# Patient Record
Sex: Male | Born: 1950 | Race: Black or African American | Hispanic: No | Marital: Single | Smoking: Never smoker
Health system: Southern US, Community
[De-identification: ages and names within clinical notes are randomized; demographics above are authoritative.]

## PROBLEM LIST (undated history)

## (undated) ENCOUNTER — Emergency Department (HOSPITAL_COMMUNITY): Disposition: A | Discharge status: Home / Self Care | Payer: Medicare Other

## (undated) ENCOUNTER — Emergency Department (HOSPITAL_COMMUNITY): Payer: Medicare Other

## (undated) DIAGNOSIS — I509 Heart failure, unspecified: Secondary | ICD-10-CM

## (undated) DIAGNOSIS — T8859XA Other complications of anesthesia, initial encounter: Secondary | ICD-10-CM

## (undated) DIAGNOSIS — F79 Unspecified intellectual disabilities: Secondary | ICD-10-CM

## (undated) DIAGNOSIS — N2581 Secondary hyperparathyroidism of renal origin: Secondary | ICD-10-CM

## (undated) DIAGNOSIS — I871 Compression of vein: Secondary | ICD-10-CM

## (undated) DIAGNOSIS — I10 Essential (primary) hypertension: Secondary | ICD-10-CM

## (undated) DIAGNOSIS — R011 Cardiac murmur, unspecified: Secondary | ICD-10-CM

## (undated) DIAGNOSIS — L03119 Cellulitis of unspecified part of limb: Secondary | ICD-10-CM

## (undated) DIAGNOSIS — T4145XA Adverse effect of unspecified anesthetic, initial encounter: Secondary | ICD-10-CM

## (undated) DIAGNOSIS — R4701 Aphasia: Secondary | ICD-10-CM

## (undated) DIAGNOSIS — A4152 Sepsis due to Pseudomonas: Secondary | ICD-10-CM

## (undated) DIAGNOSIS — L02419 Cutaneous abscess of limb, unspecified: Secondary | ICD-10-CM

## (undated) DIAGNOSIS — R55 Syncope and collapse: Secondary | ICD-10-CM

## (undated) DIAGNOSIS — E785 Hyperlipidemia, unspecified: Secondary | ICD-10-CM

## (undated) DIAGNOSIS — K219 Gastro-esophageal reflux disease without esophagitis: Secondary | ICD-10-CM

## (undated) DIAGNOSIS — N186 End stage renal disease: Secondary | ICD-10-CM

## (undated) DIAGNOSIS — R569 Unspecified convulsions: Secondary | ICD-10-CM

## (undated) DIAGNOSIS — I8500 Esophageal varices without bleeding: Secondary | ICD-10-CM

## (undated) DIAGNOSIS — D649 Anemia, unspecified: Secondary | ICD-10-CM

## (undated) HISTORY — DX: Aphasia: R47.01

## (undated) HISTORY — DX: End stage renal disease: N18.6

## (undated) HISTORY — DX: Compression of vein: I87.1

## (undated) HISTORY — DX: Cellulitis of unspecified part of limb: L03.119

## (undated) HISTORY — PX: OTHER SURGICAL HISTORY: SHX169

## (undated) HISTORY — DX: Essential (primary) hypertension: I10

## (undated) HISTORY — DX: Unspecified intellectual disabilities: F79

## (undated) HISTORY — DX: Cutaneous abscess of limb, unspecified: L02.419

## (undated) HISTORY — DX: Secondary hyperparathyroidism of renal origin: N25.81

## (undated) HISTORY — DX: Esophageal varices without bleeding: I85.00

## (undated) HISTORY — DX: Anemia, unspecified: D64.9

## (undated) HISTORY — DX: Syncope and collapse: R55

## (undated) HISTORY — DX: Heart failure, unspecified: I50.9

## (undated) HISTORY — DX: Cardiac murmur, unspecified: R01.1

## (undated) HISTORY — DX: Hyperlipidemia, unspecified: E78.5

## (undated) HISTORY — PX: THROMBECTOMY AND REVISION OF ARTERIOVENTOUS (AV) GORETEX  GRAFT: SHX6120

---

## 1998-03-21 ENCOUNTER — Encounter: Admission: RE | Admit: 1998-03-21 | Discharge: 1998-03-21 | Payer: Self-pay | Admitting: Hematology and Oncology

## 1998-06-26 ENCOUNTER — Encounter: Admission: RE | Admit: 1998-06-26 | Discharge: 1998-06-26 | Payer: Self-pay | Admitting: Internal Medicine

## 1998-07-23 ENCOUNTER — Encounter: Admission: RE | Admit: 1998-07-23 | Discharge: 1998-07-23 | Payer: Self-pay | Admitting: Internal Medicine

## 1998-08-15 ENCOUNTER — Encounter: Admission: RE | Admit: 1998-08-15 | Discharge: 1998-08-15 | Payer: Self-pay | Admitting: Hematology and Oncology

## 1998-09-18 ENCOUNTER — Encounter: Admission: RE | Admit: 1998-09-18 | Discharge: 1998-09-18 | Payer: Self-pay | Admitting: Hematology and Oncology

## 1998-12-18 ENCOUNTER — Encounter: Admission: RE | Admit: 1998-12-18 | Discharge: 1998-12-18 | Payer: Self-pay | Admitting: Internal Medicine

## 1999-06-06 ENCOUNTER — Encounter: Admission: RE | Admit: 1999-06-06 | Discharge: 1999-06-06 | Payer: Self-pay | Admitting: Hematology and Oncology

## 1999-12-19 ENCOUNTER — Encounter: Admission: RE | Admit: 1999-12-19 | Discharge: 1999-12-19 | Payer: Self-pay | Admitting: Hematology and Oncology

## 2000-05-29 ENCOUNTER — Ambulatory Visit (HOSPITAL_BASED_OUTPATIENT_CLINIC_OR_DEPARTMENT_OTHER): Admission: RE | Admit: 2000-05-29 | Discharge: 2000-05-29 | Payer: Self-pay | Admitting: Dentistry

## 2000-07-10 ENCOUNTER — Encounter: Admission: RE | Admit: 2000-07-10 | Discharge: 2000-07-10 | Payer: Self-pay | Admitting: Internal Medicine

## 2000-12-14 ENCOUNTER — Encounter: Admission: RE | Admit: 2000-12-14 | Discharge: 2000-12-14 | Payer: Self-pay | Admitting: Internal Medicine

## 2001-12-08 ENCOUNTER — Encounter: Admission: RE | Admit: 2001-12-08 | Discharge: 2001-12-08 | Payer: Self-pay | Admitting: Internal Medicine

## 2002-03-21 ENCOUNTER — Encounter: Admission: RE | Admit: 2002-03-21 | Discharge: 2002-03-21 | Payer: Self-pay | Admitting: Internal Medicine

## 2002-06-17 ENCOUNTER — Encounter: Admission: RE | Admit: 2002-06-17 | Discharge: 2002-06-17 | Payer: Self-pay | Admitting: Internal Medicine

## 2002-06-22 ENCOUNTER — Ambulatory Visit (HOSPITAL_COMMUNITY): Admission: RE | Admit: 2002-06-22 | Discharge: 2002-06-22 | Payer: Self-pay | Admitting: *Deleted

## 2002-06-22 ENCOUNTER — Encounter: Payer: Self-pay | Admitting: *Deleted

## 2002-09-23 ENCOUNTER — Ambulatory Visit (HOSPITAL_COMMUNITY): Admission: RE | Admit: 2002-09-23 | Discharge: 2002-09-23 | Payer: Self-pay | Admitting: Internal Medicine

## 2002-09-23 ENCOUNTER — Encounter: Payer: Self-pay | Admitting: Internal Medicine

## 2002-09-23 ENCOUNTER — Encounter: Admission: RE | Admit: 2002-09-23 | Discharge: 2002-09-23 | Payer: Self-pay | Admitting: Internal Medicine

## 2002-09-30 ENCOUNTER — Encounter: Admission: RE | Admit: 2002-09-30 | Discharge: 2002-09-30 | Payer: Self-pay | Admitting: Internal Medicine

## 2002-12-27 ENCOUNTER — Encounter: Admission: RE | Admit: 2002-12-27 | Discharge: 2002-12-27 | Payer: Self-pay | Admitting: Internal Medicine

## 2003-10-05 ENCOUNTER — Encounter: Admission: RE | Admit: 2003-10-05 | Discharge: 2003-10-05 | Payer: Self-pay | Admitting: Internal Medicine

## 2003-11-24 ENCOUNTER — Encounter: Admission: RE | Admit: 2003-11-24 | Discharge: 2003-11-24 | Payer: Self-pay | Admitting: Internal Medicine

## 2004-02-13 ENCOUNTER — Encounter: Admission: RE | Admit: 2004-02-13 | Discharge: 2004-02-13 | Payer: Self-pay | Admitting: Internal Medicine

## 2004-02-26 ENCOUNTER — Encounter: Admission: RE | Admit: 2004-02-26 | Discharge: 2004-02-26 | Payer: Self-pay | Admitting: Internal Medicine

## 2004-03-06 ENCOUNTER — Encounter: Payer: Self-pay | Admitting: Cardiovascular Disease

## 2004-03-06 ENCOUNTER — Ambulatory Visit (HOSPITAL_COMMUNITY)
Admission: RE | Admit: 2004-03-06 | Discharge: 2004-03-06 | Payer: Self-pay | Admitting: Physical Medicine and Rehabilitation

## 2004-05-02 ENCOUNTER — Encounter: Admission: RE | Admit: 2004-05-02 | Discharge: 2004-05-02 | Payer: Self-pay | Admitting: Internal Medicine

## 2004-05-28 ENCOUNTER — Ambulatory Visit (HOSPITAL_COMMUNITY): Admission: RE | Admit: 2004-05-28 | Discharge: 2004-05-28 | Payer: Self-pay | Admitting: Nephrology

## 2004-07-01 ENCOUNTER — Ambulatory Visit: Payer: Self-pay | Admitting: Internal Medicine

## 2004-07-05 ENCOUNTER — Ambulatory Visit: Payer: Self-pay | Admitting: Internal Medicine

## 2004-07-05 ENCOUNTER — Ambulatory Visit (HOSPITAL_COMMUNITY): Admission: RE | Admit: 2004-07-05 | Discharge: 2004-07-05 | Payer: Self-pay | Admitting: Internal Medicine

## 2004-07-05 ENCOUNTER — Inpatient Hospital Stay (HOSPITAL_COMMUNITY): Admission: AD | Admit: 2004-07-05 | Discharge: 2004-07-12 | Payer: Self-pay | Admitting: Internal Medicine

## 2004-09-04 ENCOUNTER — Ambulatory Visit (HOSPITAL_COMMUNITY): Admission: RE | Admit: 2004-09-04 | Discharge: 2004-09-04 | Payer: Self-pay | Admitting: Nephrology

## 2004-11-15 ENCOUNTER — Ambulatory Visit (HOSPITAL_COMMUNITY): Admission: RE | Admit: 2004-11-15 | Discharge: 2004-11-15 | Payer: Self-pay | Admitting: Vascular Surgery

## 2004-11-25 ENCOUNTER — Ambulatory Visit (HOSPITAL_COMMUNITY): Admission: RE | Admit: 2004-11-25 | Discharge: 2004-11-25 | Payer: Self-pay | Admitting: Vascular Surgery

## 2005-01-24 ENCOUNTER — Ambulatory Visit (HOSPITAL_COMMUNITY): Admission: RE | Admit: 2005-01-24 | Discharge: 2005-01-24 | Payer: Self-pay | Admitting: Vascular Surgery

## 2005-02-26 ENCOUNTER — Ambulatory Visit (HOSPITAL_COMMUNITY): Admission: RE | Admit: 2005-02-26 | Discharge: 2005-02-26 | Payer: Self-pay | Admitting: Vascular Surgery

## 2005-03-19 ENCOUNTER — Ambulatory Visit (HOSPITAL_COMMUNITY): Admission: RE | Admit: 2005-03-19 | Discharge: 2005-03-19 | Payer: Self-pay | Admitting: Vascular Surgery

## 2005-05-21 ENCOUNTER — Encounter: Admission: RE | Admit: 2005-05-21 | Discharge: 2005-05-21 | Payer: Self-pay | Admitting: Nephrology

## 2006-03-18 ENCOUNTER — Encounter: Admission: RE | Admit: 2006-03-18 | Discharge: 2006-03-18 | Payer: Self-pay | Admitting: Nephrology

## 2006-10-19 ENCOUNTER — Emergency Department (HOSPITAL_COMMUNITY): Admission: EM | Admit: 2006-10-19 | Discharge: 2006-10-19 | Payer: Self-pay | Admitting: Emergency Medicine

## 2007-03-11 ENCOUNTER — Ambulatory Visit: Payer: Self-pay | Admitting: Physician Assistant

## 2007-03-11 ENCOUNTER — Ambulatory Visit (HOSPITAL_COMMUNITY): Admission: RE | Admit: 2007-03-11 | Discharge: 2007-03-11 | Payer: Self-pay | Admitting: Vascular Surgery

## 2007-03-13 ENCOUNTER — Ambulatory Visit (HOSPITAL_COMMUNITY): Admission: RE | Admit: 2007-03-13 | Discharge: 2007-03-13 | Payer: Self-pay | Admitting: Vascular Surgery

## 2007-04-21 ENCOUNTER — Encounter: Admission: RE | Admit: 2007-04-21 | Discharge: 2007-04-21 | Payer: Self-pay | Admitting: Nephrology

## 2007-12-08 ENCOUNTER — Ambulatory Visit (HOSPITAL_COMMUNITY): Admission: RE | Admit: 2007-12-08 | Discharge: 2007-12-08 | Payer: Self-pay | Admitting: Vascular Surgery

## 2007-12-08 ENCOUNTER — Ambulatory Visit: Payer: Self-pay | Admitting: Vascular Surgery

## 2008-01-21 ENCOUNTER — Ambulatory Visit (HOSPITAL_COMMUNITY): Admission: RE | Admit: 2008-01-21 | Discharge: 2008-01-21 | Payer: Self-pay | Admitting: Nephrology

## 2008-02-02 ENCOUNTER — Ambulatory Visit: Payer: Self-pay | Admitting: Internal Medicine

## 2008-02-02 DIAGNOSIS — E785 Hyperlipidemia, unspecified: Secondary | ICD-10-CM

## 2008-02-02 DIAGNOSIS — N2581 Secondary hyperparathyroidism of renal origin: Secondary | ICD-10-CM

## 2008-02-02 DIAGNOSIS — D649 Anemia, unspecified: Secondary | ICD-10-CM

## 2008-02-02 DIAGNOSIS — R4701 Aphasia: Secondary | ICD-10-CM

## 2008-02-02 DIAGNOSIS — I10 Essential (primary) hypertension: Secondary | ICD-10-CM | POA: Insufficient documentation

## 2008-02-02 DIAGNOSIS — F79 Unspecified intellectual disabilities: Secondary | ICD-10-CM | POA: Insufficient documentation

## 2008-02-02 DIAGNOSIS — N186 End stage renal disease: Secondary | ICD-10-CM | POA: Insufficient documentation

## 2008-02-02 HISTORY — DX: Anemia, unspecified: D64.9

## 2008-02-02 HISTORY — DX: Unspecified intellectual disabilities: F79

## 2008-02-02 HISTORY — DX: Aphasia: R47.01

## 2008-02-02 HISTORY — DX: Essential (primary) hypertension: I10

## 2008-02-02 HISTORY — DX: Secondary hyperparathyroidism of renal origin: N25.81

## 2008-02-02 HISTORY — DX: Hyperlipidemia, unspecified: E78.5

## 2008-02-29 ENCOUNTER — Encounter (INDEPENDENT_AMBULATORY_CARE_PROVIDER_SITE_OTHER): Payer: Self-pay | Admitting: *Deleted

## 2008-03-06 ENCOUNTER — Encounter (INDEPENDENT_AMBULATORY_CARE_PROVIDER_SITE_OTHER): Payer: Self-pay | Admitting: *Deleted

## 2008-03-06 ENCOUNTER — Ambulatory Visit: Payer: Self-pay | Admitting: *Deleted

## 2008-03-06 DIAGNOSIS — I509 Heart failure, unspecified: Secondary | ICD-10-CM

## 2008-03-06 DIAGNOSIS — R609 Edema, unspecified: Secondary | ICD-10-CM

## 2008-03-06 DIAGNOSIS — L02419 Cutaneous abscess of limb, unspecified: Secondary | ICD-10-CM

## 2008-03-06 DIAGNOSIS — I498 Other specified cardiac arrhythmias: Secondary | ICD-10-CM

## 2008-03-06 DIAGNOSIS — L03119 Cellulitis of unspecified part of limb: Secondary | ICD-10-CM

## 2008-03-06 DIAGNOSIS — H6120 Impacted cerumen, unspecified ear: Secondary | ICD-10-CM

## 2008-03-06 HISTORY — DX: Heart failure, unspecified: I50.9

## 2008-03-06 HISTORY — DX: Cutaneous abscess of limb, unspecified: L02.419

## 2008-05-04 ENCOUNTER — Emergency Department (HOSPITAL_COMMUNITY): Admission: EM | Admit: 2008-05-04 | Discharge: 2008-05-05 | Payer: Self-pay | Admitting: Emergency Medicine

## 2008-05-18 ENCOUNTER — Ambulatory Visit: Payer: Self-pay | Admitting: Internal Medicine

## 2008-05-18 ENCOUNTER — Inpatient Hospital Stay (HOSPITAL_COMMUNITY): Admission: EM | Admit: 2008-05-18 | Discharge: 2008-05-20 | Payer: Self-pay | Admitting: Emergency Medicine

## 2008-06-28 ENCOUNTER — Encounter (INDEPENDENT_AMBULATORY_CARE_PROVIDER_SITE_OTHER): Payer: Self-pay | Admitting: *Deleted

## 2008-06-28 ENCOUNTER — Ambulatory Visit: Payer: Self-pay | Admitting: Internal Medicine

## 2008-06-28 DIAGNOSIS — R1084 Generalized abdominal pain: Secondary | ICD-10-CM

## 2008-07-21 ENCOUNTER — Ambulatory Visit (HOSPITAL_COMMUNITY): Admission: RE | Admit: 2008-07-21 | Discharge: 2008-07-21 | Payer: Self-pay | Admitting: Nephrology

## 2008-08-08 ENCOUNTER — Encounter (INDEPENDENT_AMBULATORY_CARE_PROVIDER_SITE_OTHER): Payer: Self-pay | Admitting: *Deleted

## 2008-08-09 ENCOUNTER — Telehealth (INDEPENDENT_AMBULATORY_CARE_PROVIDER_SITE_OTHER): Payer: Self-pay | Admitting: *Deleted

## 2008-08-16 ENCOUNTER — Encounter (INDEPENDENT_AMBULATORY_CARE_PROVIDER_SITE_OTHER): Payer: Self-pay | Admitting: Gastroenterology

## 2008-08-16 ENCOUNTER — Inpatient Hospital Stay (HOSPITAL_COMMUNITY): Admission: EM | Admit: 2008-08-16 | Discharge: 2008-08-22 | Payer: Self-pay | Admitting: Emergency Medicine

## 2008-08-16 ENCOUNTER — Ambulatory Visit: Payer: Self-pay | Admitting: Infectious Disease

## 2008-08-24 ENCOUNTER — Encounter (INDEPENDENT_AMBULATORY_CARE_PROVIDER_SITE_OTHER): Payer: Self-pay | Admitting: Internal Medicine

## 2008-08-25 ENCOUNTER — Encounter (INDEPENDENT_AMBULATORY_CARE_PROVIDER_SITE_OTHER): Payer: Self-pay | Admitting: Internal Medicine

## 2008-08-25 ENCOUNTER — Ambulatory Visit: Payer: Self-pay | Admitting: Internal Medicine

## 2008-08-25 DIAGNOSIS — L919 Hypertrophic disorder of the skin, unspecified: Secondary | ICD-10-CM

## 2008-08-25 DIAGNOSIS — L909 Atrophic disorder of skin, unspecified: Secondary | ICD-10-CM | POA: Insufficient documentation

## 2008-08-25 DIAGNOSIS — K259 Gastric ulcer, unspecified as acute or chronic, without hemorrhage or perforation: Secondary | ICD-10-CM

## 2008-09-21 ENCOUNTER — Encounter (INDEPENDENT_AMBULATORY_CARE_PROVIDER_SITE_OTHER): Payer: Self-pay | Admitting: *Deleted

## 2008-09-22 ENCOUNTER — Telehealth (INDEPENDENT_AMBULATORY_CARE_PROVIDER_SITE_OTHER): Payer: Self-pay | Admitting: *Deleted

## 2008-10-23 ENCOUNTER — Telehealth (INDEPENDENT_AMBULATORY_CARE_PROVIDER_SITE_OTHER): Payer: Self-pay | Admitting: *Deleted

## 2008-11-06 ENCOUNTER — Ambulatory Visit: Payer: Self-pay | Admitting: Internal Medicine

## 2008-11-06 DIAGNOSIS — H5789 Other specified disorders of eye and adnexa: Secondary | ICD-10-CM | POA: Insufficient documentation

## 2008-11-06 DIAGNOSIS — I8500 Esophageal varices without bleeding: Secondary | ICD-10-CM | POA: Insufficient documentation

## 2008-11-07 DIAGNOSIS — I871 Compression of vein: Secondary | ICD-10-CM

## 2008-11-07 DIAGNOSIS — I8500 Esophageal varices without bleeding: Secondary | ICD-10-CM

## 2008-11-07 HISTORY — DX: Esophageal varices without bleeding: I85.00

## 2008-11-07 HISTORY — DX: Compression of vein: I87.1

## 2008-12-27 ENCOUNTER — Telehealth (INDEPENDENT_AMBULATORY_CARE_PROVIDER_SITE_OTHER): Payer: Self-pay | Admitting: *Deleted

## 2009-01-16 ENCOUNTER — Telehealth (INDEPENDENT_AMBULATORY_CARE_PROVIDER_SITE_OTHER): Payer: Self-pay | Admitting: *Deleted

## 2009-01-16 ENCOUNTER — Emergency Department (HOSPITAL_COMMUNITY): Admission: EM | Admit: 2009-01-16 | Discharge: 2009-01-16 | Payer: Self-pay | Admitting: Emergency Medicine

## 2009-01-30 ENCOUNTER — Inpatient Hospital Stay (HOSPITAL_COMMUNITY): Admission: EM | Admit: 2009-01-30 | Discharge: 2009-02-01 | Payer: Self-pay | Admitting: Emergency Medicine

## 2009-01-30 ENCOUNTER — Ambulatory Visit: Payer: Self-pay | Admitting: Infectious Diseases

## 2009-01-30 ENCOUNTER — Encounter (INDEPENDENT_AMBULATORY_CARE_PROVIDER_SITE_OTHER): Payer: Self-pay | Admitting: *Deleted

## 2009-02-01 ENCOUNTER — Ambulatory Visit: Payer: Self-pay | Admitting: Vascular Surgery

## 2009-02-02 ENCOUNTER — Encounter (INDEPENDENT_AMBULATORY_CARE_PROVIDER_SITE_OTHER): Payer: Self-pay | Admitting: *Deleted

## 2009-02-12 ENCOUNTER — Ambulatory Visit: Payer: Self-pay | Admitting: Internal Medicine

## 2009-03-01 ENCOUNTER — Encounter (INDEPENDENT_AMBULATORY_CARE_PROVIDER_SITE_OTHER): Payer: Self-pay | Admitting: *Deleted

## 2009-05-04 ENCOUNTER — Encounter: Payer: Self-pay | Admitting: Internal Medicine

## 2009-05-04 ENCOUNTER — Ambulatory Visit: Payer: Self-pay | Admitting: Internal Medicine

## 2009-05-04 ENCOUNTER — Inpatient Hospital Stay (HOSPITAL_COMMUNITY): Admission: AD | Admit: 2009-05-04 | Discharge: 2009-05-09 | Payer: Self-pay | Admitting: Internal Medicine

## 2009-05-04 DIAGNOSIS — R55 Syncope and collapse: Secondary | ICD-10-CM

## 2009-05-07 ENCOUNTER — Encounter (INDEPENDENT_AMBULATORY_CARE_PROVIDER_SITE_OTHER): Payer: Self-pay | Admitting: Nephrology

## 2009-05-07 ENCOUNTER — Encounter (INDEPENDENT_AMBULATORY_CARE_PROVIDER_SITE_OTHER): Payer: Self-pay | Admitting: Internal Medicine

## 2009-05-07 DIAGNOSIS — R55 Syncope and collapse: Secondary | ICD-10-CM

## 2009-05-07 HISTORY — DX: Syncope and collapse: R55

## 2009-05-09 ENCOUNTER — Encounter (INDEPENDENT_AMBULATORY_CARE_PROVIDER_SITE_OTHER): Payer: Self-pay | Admitting: Dermatology

## 2009-05-23 ENCOUNTER — Ambulatory Visit: Payer: Self-pay | Admitting: Internal Medicine

## 2009-05-23 DIAGNOSIS — G253 Myoclonus: Secondary | ICD-10-CM

## 2009-05-23 DIAGNOSIS — I9589 Other hypotension: Secondary | ICD-10-CM

## 2009-07-09 ENCOUNTER — Emergency Department (HOSPITAL_COMMUNITY): Admission: EM | Admit: 2009-07-09 | Discharge: 2009-07-09 | Payer: Self-pay | Admitting: Emergency Medicine

## 2009-07-09 ENCOUNTER — Emergency Department (HOSPITAL_COMMUNITY): Admission: EM | Admit: 2009-07-09 | Discharge: 2009-07-10 | Payer: Self-pay | Admitting: Emergency Medicine

## 2009-07-09 ENCOUNTER — Ambulatory Visit (HOSPITAL_COMMUNITY): Admission: RE | Admit: 2009-07-09 | Discharge: 2009-07-09 | Payer: Self-pay | Admitting: Nephrology

## 2009-07-21 ENCOUNTER — Ambulatory Visit: Payer: Self-pay | Admitting: Internal Medicine

## 2009-07-21 ENCOUNTER — Encounter: Payer: Self-pay | Admitting: Internal Medicine

## 2009-07-21 ENCOUNTER — Inpatient Hospital Stay (HOSPITAL_COMMUNITY): Admission: EM | Admit: 2009-07-21 | Discharge: 2009-07-25 | Payer: Self-pay | Admitting: Emergency Medicine

## 2009-07-25 ENCOUNTER — Encounter: Payer: Self-pay | Admitting: Internal Medicine

## 2009-07-30 ENCOUNTER — Telehealth: Payer: Self-pay | Admitting: *Deleted

## 2009-08-10 ENCOUNTER — Ambulatory Visit: Payer: Self-pay | Admitting: Internal Medicine

## 2009-08-10 DIAGNOSIS — R011 Cardiac murmur, unspecified: Secondary | ICD-10-CM

## 2009-08-10 HISTORY — DX: Cardiac murmur, unspecified: R01.1

## 2009-08-22 ENCOUNTER — Ambulatory Visit (HOSPITAL_COMMUNITY): Admission: RE | Admit: 2009-08-22 | Discharge: 2009-08-22 | Payer: Self-pay | Admitting: Internal Medicine

## 2009-08-22 ENCOUNTER — Encounter: Payer: Self-pay | Admitting: Internal Medicine

## 2009-08-22 ENCOUNTER — Ambulatory Visit: Payer: Self-pay | Admitting: Cardiovascular Disease

## 2009-09-19 ENCOUNTER — Telehealth: Payer: Self-pay | Admitting: *Deleted

## 2009-10-08 ENCOUNTER — Encounter (INDEPENDENT_AMBULATORY_CARE_PROVIDER_SITE_OTHER): Payer: Self-pay | Admitting: Internal Medicine

## 2009-11-05 ENCOUNTER — Telehealth (INDEPENDENT_AMBULATORY_CARE_PROVIDER_SITE_OTHER): Payer: Self-pay | Admitting: Internal Medicine

## 2009-11-23 ENCOUNTER — Ambulatory Visit: Payer: Self-pay | Admitting: Internal Medicine

## 2009-12-12 ENCOUNTER — Ambulatory Visit: Payer: Self-pay | Admitting: Internal Medicine

## 2009-12-12 DIAGNOSIS — J069 Acute upper respiratory infection, unspecified: Secondary | ICD-10-CM | POA: Insufficient documentation

## 2009-12-17 ENCOUNTER — Ambulatory Visit: Payer: Self-pay | Admitting: Surgery

## 2009-12-28 ENCOUNTER — Ambulatory Visit (HOSPITAL_COMMUNITY): Admission: RE | Admit: 2009-12-28 | Discharge: 2009-12-28 | Payer: Self-pay | Admitting: Surgery

## 2009-12-28 ENCOUNTER — Ambulatory Visit: Payer: Self-pay | Admitting: Surgery

## 2009-12-30 ENCOUNTER — Inpatient Hospital Stay (HOSPITAL_COMMUNITY): Admission: EM | Admit: 2009-12-30 | Discharge: 2010-01-02 | Payer: Self-pay | Admitting: Emergency Medicine

## 2009-12-30 ENCOUNTER — Encounter (INDEPENDENT_AMBULATORY_CARE_PROVIDER_SITE_OTHER): Payer: Self-pay | Admitting: Internal Medicine

## 2009-12-30 ENCOUNTER — Ambulatory Visit: Payer: Self-pay | Admitting: Internal Medicine

## 2010-01-02 ENCOUNTER — Encounter: Payer: Self-pay | Admitting: Internal Medicine

## 2010-01-02 ENCOUNTER — Encounter: Payer: Self-pay | Admitting: Vascular Surgery

## 2010-01-04 ENCOUNTER — Telehealth (INDEPENDENT_AMBULATORY_CARE_PROVIDER_SITE_OTHER): Payer: Self-pay | Admitting: Internal Medicine

## 2010-01-11 ENCOUNTER — Ambulatory Visit: Payer: Self-pay | Admitting: Vascular Surgery

## 2010-01-17 ENCOUNTER — Telehealth (INDEPENDENT_AMBULATORY_CARE_PROVIDER_SITE_OTHER): Payer: Self-pay | Admitting: Internal Medicine

## 2010-02-18 ENCOUNTER — Ambulatory Visit: Payer: Self-pay | Admitting: Internal Medicine

## 2010-03-04 ENCOUNTER — Telehealth (INDEPENDENT_AMBULATORY_CARE_PROVIDER_SITE_OTHER): Payer: Self-pay | Admitting: Internal Medicine

## 2010-04-26 ENCOUNTER — Telehealth: Payer: Self-pay | Admitting: Internal Medicine

## 2010-05-24 ENCOUNTER — Telehealth: Payer: Self-pay | Admitting: Internal Medicine

## 2010-06-05 ENCOUNTER — Ambulatory Visit: Payer: Self-pay | Admitting: Vascular Surgery

## 2010-06-14 ENCOUNTER — Ambulatory Visit (HOSPITAL_COMMUNITY): Admission: RE | Admit: 2010-06-14 | Discharge: 2010-06-14 | Payer: Self-pay | Admitting: Vascular Surgery

## 2010-06-14 ENCOUNTER — Ambulatory Visit: Payer: Self-pay | Admitting: Vascular Surgery

## 2010-06-21 ENCOUNTER — Telehealth: Payer: Self-pay | Admitting: Internal Medicine

## 2010-06-26 ENCOUNTER — Ambulatory Visit: Payer: Self-pay | Admitting: Vascular Surgery

## 2010-06-27 ENCOUNTER — Telehealth: Payer: Self-pay | Admitting: Internal Medicine

## 2010-08-28 ENCOUNTER — Ambulatory Visit: Payer: Self-pay | Admitting: Vascular Surgery

## 2010-08-28 ENCOUNTER — Ambulatory Visit (HOSPITAL_COMMUNITY): Admission: AD | Admit: 2010-08-28 | Discharge: 2010-08-28 | Payer: Self-pay | Admitting: Vascular Surgery

## 2010-08-30 ENCOUNTER — Telehealth: Payer: Self-pay | Admitting: Internal Medicine

## 2010-09-06 ENCOUNTER — Telehealth: Payer: Self-pay | Admitting: Internal Medicine

## 2010-11-05 ENCOUNTER — Telehealth: Payer: Self-pay | Admitting: *Deleted

## 2010-11-09 ENCOUNTER — Encounter: Payer: Self-pay | Admitting: Nephrology

## 2010-11-10 ENCOUNTER — Encounter: Payer: Self-pay | Admitting: Nephrology

## 2010-11-11 ENCOUNTER — Encounter: Payer: Self-pay | Admitting: Nephrology

## 2010-11-13 ENCOUNTER — Ambulatory Visit
Admission: RE | Admit: 2010-11-13 | Discharge: 2010-11-13 | Payer: Self-pay | Source: Home / Self Care | Attending: Vascular Surgery | Admitting: Vascular Surgery

## 2010-11-14 NOTE — Assessment & Plan Note (Signed)
OFFICE VISIT  [name], [name] DOB:  1951-08-25                                       11/13/2010 KXFGH#:[id number]  [name] has been evaluated previously for a right thigh graft.  He has had a history of mental retardation and does not like to be cannulated with needles.  Therefore the family had been reluctant to proceed with surgery.  They are now ready to proceed, so I suggested that he come in so I can see him and talk to him some before his surgery.  I have had a long discussion with the family who is now agreeable to placement a right thigh AV graft.  I reviewed my previous notes and this appears to a reasonable option.  Has a Diatek in the left side.  He dialyzes Tuesdays, Thursdays and Saturdays and we will schedule his surgery for a non dialysis day.  He will come in on February 3 which is a Friday, have his graft placed and then get dialysis in the hospital on Saturday with plans to discharge after dialysis.  We have again discussed the indications for surgery and the potential complications and the family is agreeable to proceed.    Di Kindle. Edilia Bo, M.D. Electronically Signed  CSD/MEDQ  D:  11/13/2010  T:  11/14/2010  Job:  [id number]  cc:   [facility]

## 2010-11-19 NOTE — Miscellaneous (Signed)
Summary: Hospital Discharge Update  Hospital Discharge  Date of admission:12/30/2009  Date of discharge:01/02/2010  Brief reason for admission/active problems: 1) RUE Graft removal - by vascular surgery due to limb ischemia 2) ESRD - on T, Th, S HD. Currently has R femoral cath access 3) Hypotension - chronic, discharged following day after HD when patient was at his baseline   Followup needed: 1) Ensure patient had home PT/OT    The medication and problem lists have been updated.  Please see the dictated discharge summary for details.       Complete Medication List: 1)  Dialyvite Tabs (B complex-c-folic acid) .... Take 1 tablet by mouth once a day 2)  Calcium 500 Mg Tabs (Calcium) .... Take 1 tablet by mouth once a day 3)  Lipitor 20 Mg Tabs (Atorvastatin calcium) .... Take 1 tablet by mouth once a day 4)  Epogen 3000 Unit/ml Soln (Epoetin alfa) .... 3400 iv 3 times a week 5)  Protonix 40 Mg Pack (Pantoprazole sodium) .... Take one tablet two times a day 6)  Tums Ultra 1000 1000 Mg Chew (Calcium carbonate antacid) .... Take one tablet with each meal 7)  Clonazepam 0.5 Mg Tabs (Clonazepam) .... Take 1/2 tablet by mouth two times a day.   Allergies: No Known Drug Allergies   Patient Instructions: 1)  Please follow up with Dr. Clent Ridges on January 29, 2010 at 1:30 pm.  2)  Please follow up with vascular surgery as needed/scheduled.  3)  Please  contact clinic if Right arm worsens.

## 2010-11-19 NOTE — Progress Notes (Signed)
Summary: refill/gg  Phone Note Refill Request  on Mar 04, 2010 3:25 PM  Refills Requested: Medication #1:  PROTONIX 40 MG PACK Take one tablet two times a day  Method Requested: Electronic Initial call taken by: Merrie Roof RN,  Mar 04, 2010 3:25 PM    Prescriptions: PROTONIX 40 MG PACK (PANTOPRAZOLE SODIUM) Take one tablet two times a day  #60 Packet x 5   Entered and Authorized by:   Elby Showers MD   Signed by:   Elby Showers MD on 03/04/2010   Method used:   Electronically to        [facility] (retail)       [address]       [place], [place]  [address]       Ph: [phone] or [phone]       Fax: [phone]   RxID:   [id number]

## 2010-11-19 NOTE — Progress Notes (Signed)
Summary: Refill/gh  Phone Note Refill Request Message from:  Fax from Pharmacy on June 27, 2010 12:14 PM  Refills Requested: Medication #1:  LIPITOR 20 MG  TABS Take 1 tablet by mouth once a day   Last Refilled: 05/24/2010  Method Requested: Electronic Initial call taken by: Angelina Ok RN,  June 27, 2010 12:15 PM    Prescriptions: LIPITOR 20 MG  TABS (ATORVASTATIN CALCIUM) Take 1 tablet by mouth once a day  #30 Tablet x 0   Entered and Authorized by:   Darnelle Maffucci MD   Signed by:   Darnelle Maffucci MD on 06/29/2010   Method used:   Electronically to        [facility] (retail)       [address]       [place], [place]  [address]       Ph: [phone] or [phone]       Fax: [phone]   RxID:   [id number]

## 2010-11-19 NOTE — Progress Notes (Signed)
Summary: phone *  Phone Note From Other Clinic   Summary of Call: Call from case manager [phone]) ( [place])for pt asking for nursing visits along with the PT and OT that has been ordered.  They request nursing visits on days he does not go to dialysis.  Pt is not eating.    Will you order this? Keane Scrape [phone] ) care giver Initial call taken by: Merrie Roof RN,  January 04, 2010 10:55 AM  Follow-up for Phone Call        I have put an order for home health nursing into the clinic chart.  Please forward to what ever agency he is using.  Let me know if I need to do anything else. Thanks.     Appended Document: phone Genevieve Norlander informed and will see pt

## 2010-11-19 NOTE — Assessment & Plan Note (Signed)
Summary: ACUTE-COUGH([facility])/CFB   Vital Signs:  Patient profile:   60 year old male Height:      61 inches (154.94 cm) Weight:      141.3 pounds (64.23 kg) BMI:     26.79 Temp:     97.6 degrees F (36.44 degrees C) oral Pulse rate:   100 / minute BP sitting:   97 / 63  (right arm) Cuff size:   regular  Vitals Entered By: Theotis Barrio NT II (December 12, 2009 1:40 PM) CC: X4 DAYS COUGH / 2 DAYS OF NO EATING NOR DRINKING /  COUGH /  RUNNY NOSE X4 DAYS- HAS GOTTEN WORSE, Depression Pain Assessment Patient in pain? no      Nutritional Status BMI of 25 - 29 = overweight  Have you ever been in a relationship where you felt threatened, hurt or afraid?No   Does patient need assistance? Functional Status Self care Ambulation Normal Comments X4 DAYS COUGH -RUNNY NOSE /  2 DAYS OF NO EATING NOR DRINKING /  COUGH  Altamese Cabal NOSE HAS GOTTEN WORSE   Primary Care Provider:  Elby Showers MD  CC:  X4 DAYS COUGH / 2 DAYS OF NO EATING NOR DRINKING /  COUGH /  RUNNY NOSE X4 DAYS- HAS GOTTEN WORSE and Depression.  History of Present Illness: Pt is a 60 yo M with MR, mutism, ESRD, CHF who is here with his caregiver because of a recent cough.   Since HD on Saturday he heas had a cough, it has been worsening.  Yesterday he stopped eating.  He went to dialysis.  Yesterday at 6:30 pm he had a sz that lasted .  This included eyes rolling back in head and his whole body was shaking.  He lost control of his bowels.  He did not take his benzos today or yesterday.  He had some myoclonic jerks today which he frequently has.  He has not been eating or drinking much in the last few days.  He went to HD yesterday.   No fevers noted.  No vomiting or diarrhea.   No sick contact.  He goes to [facility] 3 times a weeks for 6 hrs.  He did receive his flu shot this year.    Depression History:      The patient denies a depressed mood most of the day and a diminished interest in his usual daily activities.        Preventive Screening-Counseling & Management  Alcohol-Tobacco     Smoking Status: never  Caffeine-Diet-Exercise     Does Patient Exercise: no  Current Medications (verified): 1)  Dialyvite   Tabs (B Complex-C-Folic Acid) .... Take 1 Tablet By Mouth Once A Day 2)  Calcium 500 Mg  Tabs (Calcium) .... Take 1 Tablet By Mouth Once A Day 3)  Lipitor 20 Mg  Tabs (Atorvastatin Calcium) .... Take 1 Tablet By Mouth Once A Day 4)  Epogen 3000 Unit/ml  Soln (Epoetin Alfa) .... 3400 Iv 3 Times A Week 5)  Protonix 40 Mg Pack (Pantoprazole Sodium) .... Take One Tablet Two Times A Day 6)  Tums Ultra 1000 1000 Mg Chew (Calcium Carbonate Antacid) .... Take One Tablet With Each Meal 7)  Clonazepam 0.5 Mg Tabs (Clonazepam) .... Take 1/2 Tablet By Mouth Two Times A Day.  Allergies (verified): No Known Drug Allergies  Past History:  Past Medical History: Last updated: 02/02/2008 Anemia-iron deficiency Hyperlipidemia Hypertension Renal failure Positive ANA speckled pattern titer:  1:160 Hx of Cellulitis of  right leg Mental retardation Mutism Secondary hyperparathyroidism Complex cyst right kidney Pt with left arm AV graft that he won't let HD use Pt with right arm cath for HD now CAREGIVER:  Carroll Sage Sister:  POA  [name]  Social History: Last updated: 08/10/2009 Single Alcohol use-no Drug use-no Lives at home - with care provider, Carroll Sage - for last 18 yrs.  Review of Systems       The patient complains of prolonged cough.  The patient denies fever, melena, and hematochezia.    Physical Exam  General:  alert and well-developed.  alert and well-developed.   Head:  atraumatic.  atraumatic.   Eyes:  conjunctiva injected bilaterally,no mucopurulent drainage. Nose:  clear mucus drainage Mouth:  no oropharyngeal lesions Lungs:  normal breath sounds, no crackles, and no wheezes.   Heart:  2/6 SEM heard best at left 2nd ICSnormal rate, regular rhythm, no gallop, and no  rub.   Abdomen:  soft, non-tender, no distention, no masses, no guarding, and no rigidity.   Pulses:  1+ throughout Extremities:  trace left pedal edema and trace right pedal edema.   Neurologic:  mute, MR, not fully cooperative   Impression & Recommendations:  Problem # 1:  UPPER RESPIRATORY INFECTION, ACUTE (ICD-465.9) Pt's symptom complex and exam are most consistent with a viral URI.  His lungs are clear and he has been afebrile.  He goes to what sounds like a day care several days a week and likely came into contact with the virus there. I have informed his caretaker that he is likely to improve within the coming days and that they can continue to try to use robitussin as needed.  They should also tryto keep him hydrated if possible.  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  I think this was due to him being off of his benzodiazepine for the past few days.  His caretaker thinks that they will be able to get him to take this pill over the coming days until he improves completely from #1.  Problem # 3:  HYPOTENSION (ICD-458.9) His BP is soft today, but better than it has been recently.  He does not look septic by any means.  He has been asymptomatic of this as far as we can tell.  He will have HD tomorrow.  Problem # 4:  RENAL FAILURE (ICD-586) Pt had HD yesterday and will go again tomorrow.  I have advised his caretakers to mention these recent problems to his nephrologists again tomorrow and to get some advice about appropriate rehydration regimens.  Perhaps they can put some fluid on him tomorrow if they feel he is total body fluid deplete.  He has trace edema in his bilateral lower extremities, so I do not think he is fluid deplete today.  Complete Medication List: 1)  Dialyvite Tabs (B complex-c-folic acid) .... Take 1 tablet by mouth once a day 2)   Calcium 500 Mg Tabs (Calcium) .... Take 1 tablet by mouth once a day 3)  Lipitor 20 Mg Tabs (Atorvastatin calcium) .... Take 1 tablet by mouth once a day 4)  Epogen 3000 Unit/ml Soln (Epoetin alfa) .... 3400 iv 3 times a week 5)  Protonix 40 Mg Pack (Pantoprazole sodium) .... Take one tablet two times a day 6)  Tums Ultra 1000 1000 Mg Chew (Calcium carbonate antacid) .Marland KitchenMarland KitchenMarland Kitchen  Take one tablet with each meal 7)  Clonazepam 0.5 Mg Tabs (Clonazepam) .... Take 1/2 tablet by mouth two times a day.  Patient Instructions: 1)  This is likely a viral upper respirator infection.  I do not detect any signs of pneumonia or other bacterial infections.  This should get better with time. 2)  Make sure he continues to take his Clonazepam as prescribed.  If he can't take his other meds for a few days that should be OK. 3)  Try to get him to take in some liquids if nothing else.  Ensure is OK for today if that is all he will take, but as his renal docs about that tomorrow. 4)  Come back if his symptoms aren't better in 2 weeks otherwise schedule an appointment for 2 months for a return check up. 5)  He can continue to take robitussin for the cough if he can tolerate it.   Prevention & Chronic Care Immunizations   Influenza vaccine: Not documented   Influenza vaccine deferral: Refused  (08/10/2009)    Tetanus booster: Not documented   Td booster deferral: Not indicated  (08/10/2009)    Pneumococcal vaccine: Not documented    H. zoster vaccine: Not documented  Colorectal Screening   Hemoccult: Not documented    Colonoscopy: Not documented  Other Screening   PSA: Not documented   Smoking status: never  (12/12/2009)  Lipids   Total Cholesterol: Not documented   LDL: Not documented   LDL Direct: Not documented   HDL: Not documented   Triglycerides: Not documented    SGOT (AST): Not documented   SGPT (ALT): Not documented   Alkaline phosphatase: Not documented   Total bilirubin: Not  documented  Hypertension   Last Blood Pressure: 97 / 63  (12/12/2009)   Serum creatinine: Not documented   Serum potassium Not documented  Self-Management Support :   Personal Goals (by the next clinic visit) :      Personal blood pressure goal: 140/90  (12/12/2009)     Personal LDL goal: 100  (12/12/2009)    Patient will work on the following items until the next clinic visit to reach self-care goals:     Medications and monitoring: take my medicines every day, bring all of my medications to every visit  (12/12/2009)     Eating: drink diet soda or water instead of juice or soda, eat more vegetables, use fresh or frozen vegetables, eat foods that are low in salt, eat baked foods instead of fried foods, eat fruit for snacks and desserts, limit or avoid alcohol  (12/12/2009)     Activity: take a 30 minute walk every day  (05/04/2009)    Hypertension self-management support: Resources for patients handout  (12/12/2009)    Lipid self-management support: Resources for patients handout  (12/12/2009)        Resource handout printed.

## 2010-11-19 NOTE — Progress Notes (Signed)
Summary: med refill/gp  Phone Note Refill Request Message from:  Fax from Pharmacy on January 17, 2010 12:24 PM  Refills Requested: Medication #1:  CLONAZEPAM 0.5 MG TABS take 1/2 tablet by mouth two times a day.   Last Refilled: 12/05/2009  Method Requested: Telephone to Pharmacy Initial call taken by: Chinita Pester RN,  January 17, 2010 12:24 PM  Follow-up for Phone Call        Reviewed chart.  Refill appropriate. Follow-up by: Blanch Media MD,  January 18, 2010 3:58 PM  Additional Follow-up for Phone Call Additional follow up Details #1::        Rx called to pharmacy.  Family notified that prescription has been called to the pharmacy. Additional Follow-up by: Angelina Ok RN,  January 18, 2010 4:20 PM    Prescriptions: CLONAZEPAM 0.5 MG TABS (CLONAZEPAM) take 1/2 tablet by mouth two times a day.  #32 x 2   Entered and Authorized by:   Blanch Media MD   Signed by:   Blanch Media MD on 01/18/2010   Method used:   Telephoned to ...       [facility] (retail)       [address]       [place], [place]  [id number]       Ph: [phone] or [phone]       Fax: [phone]   RxID:   [id number]

## 2010-11-19 NOTE — Consult Note (Signed)
Summary: [facility]: New Pt. Eval  [facility]: New Pt. Eval   Imported By: Florinda Marker 11/01/2009 14:24:46  _____________________________________________________________________  External Attachment:    Type:   Image     Comment:   External Document

## 2010-11-19 NOTE — Progress Notes (Signed)
Summary: Refill/gh  Phone Note Refill Request Message from:  Fax from Pharmacy on May 24, 2010 3:10 PM  Refills Requested: Medication #1:  LIPITOR 20 MG  TABS Take 1 tablet by mouth once a day   Last Refilled: 04/26/2010  Method Requested: Electronic Initial call taken by: Angelina Ok RN,  May 24, 2010 3:11 PM  Follow-up for Phone Call       Follow-up by: Darnelle Maffucci MD,  May 24, 2010 3:48 PM    Prescriptions: LIPITOR 20 MG  TABS (ATORVASTATIN CALCIUM) Take 1 tablet by mouth once a day  #30 Tablet x 0   Entered and Authorized by:   Darnelle Maffucci MD   Signed by:   Darnelle Maffucci MD on 05/24/2010   Method used:   Electronically to        [facility] (retail)       [address]       [place], [place]  [id number]       Ph: [phone] or [phone]       Fax: [phone]   RxID:   [id number]

## 2010-11-19 NOTE — Progress Notes (Signed)
Summary: refill/ hla  Phone Note Refill Request Message from:  Fax from Pharmacy on August 30, 2010 2:53 PM  Refills Requested: Medication #1:  CLONAZEPAM 0.5 MG TABS take 1/2 tablet by mouth two times a day.   Dosage confirmed as above?Dosage Confirmed   Last Refilled: 10/3 last visit 02/2010, last labs 12/2009  Initial call taken by: Marin Roberts RN,  August 30, 2010 2:54 PM  Follow-up for Phone Call        Refill approved-nurse to complete Follow-up by: Darnelle Maffucci MD,  August 30, 2010 6:01 PM  Additional Follow-up for Phone Call Additional follow up Details #1::        Rx called to pharmacy Additional Follow-up by: Merrie Roof RN,  September 02, 2010 12:50 PM    Prescriptions: CLONAZEPAM 0.5 MG TABS (CLONAZEPAM) take 1/2 tablet by mouth two times a day.  #32 x 3   Entered and Authorized by:   Darnelle Maffucci MD   Signed by:   Darnelle Maffucci MD on 08/30/2010   Method used:   Telephoned to ...       [facility] (retail)       [address]       [place], [place]  [id number]       Ph: [phone] or [phone]       Fax: [phone]   RxID:   [id number]

## 2010-11-19 NOTE — Progress Notes (Signed)
Summary: refill/ hla  Phone Note Refill Request Message from:  Fax from Pharmacy on November 05, 2009 11:34 AM  Refills Requested: Medication #1:  CLONAZEPAM 0.5 MG TABS take 1/2 tablet by mouth two times a day..   Last Refilled: 07/27/2009 Initial call taken by: Marin Roberts RN,  November 05, 2009 11:34 AM  Follow-up for Phone Call        signed. please call in.  thank you.    Prescriptions: CLONAZEPAM 0.5 MG TABS (CLONAZEPAM) take 1/2 tablet by mouth two times a day.  #32 x 1   Entered and Authorized by:   Elby Showers MD   Signed by:   Elby Showers MD on 11/06/2009   Method used:   Telephoned to ...       [facility] (retail)       [address]       [place], [place]  [id number]       Ph: [phone] or [phone]       Fax: [phone]   RxID:   [id number]

## 2010-11-19 NOTE — Progress Notes (Signed)
Summary: refill/ hla  Phone Note Refill Request Message from:  Fax from Pharmacy on April 26, 2010 5:06 PM  Refills Requested: Medication #1:  LIPITOR 20 MG  TABS Take 1 tablet by mouth once a day   Last Refilled: 6/2 Initial call taken by: Marin Roberts RN,  April 26, 2010 5:06 PM  Follow-up for Phone Call        Refill approved-nurse to complete Follow-up by: Julaine Fusi  DO,  April 26, 2010 5:25 PM    Prescriptions: LIPITOR 20 MG  TABS (ATORVASTATIN CALCIUM) Take 1 tablet by mouth once a day  #30 Tablet x 0   Entered by:   Julaine Fusi  DO   Authorized by:   Darnelle Maffucci MD   Signed by:   Julaine Fusi  DO on 04/26/2010   Method used:   Electronically to        [facility] (retail)       [address]       [place], [place]  [id number]       Ph: [phone] or [phone]       Fax: [phone]   RxID:   [id number]

## 2010-11-19 NOTE — Assessment & Plan Note (Signed)
Summary: EST-CK/FU/MEDS/CFB   Vital Signs:  Patient profile:   60 year old male Height:      61 inches (154.94 cm) Weight:      147.3 pounds (66.95 kg) BMI:     27.93 Pulse rate:   93 / minute BP sitting:   133 / 85  (right arm)  Vitals Entered By: Krystal Eaton Duncan Dull) (November 23, 2009 3:12 PM)  Does patient need assistance? Functional Status Cook/clean, Shopping, Social activities Ambulation Impaired:Risk for fall   Primary Care Provider:  Elby Showers MD   History of Present Illness: This is a 60 year old man with past medical history outlined in this chart who is here for check up.  He is with his family and they have several concerns:  1) he is getting HD though a catether in the right arm.  they wonder what would happen if the catheter were to become infected or clotted. what would we do?  They wonder if he could have a graft placed in his thigh.  2) want me to fill out paperwork for a camp this summer  3) he has inflamed conjuntiva and will not tolerate eye drops.  4) wonder if he is getting gynecomastia.    Current Medications (verified): 1)  Dialyvite   Tabs (B Complex-C-Folic Acid) .... Take 1 Tablet By Mouth Once A Day 2)  Calcium 500 Mg  Tabs (Calcium) .... Take 1 Tablet By Mouth Once A Day 3)  Lipitor 20 Mg  Tabs (Atorvastatin Calcium) .... Take 1 Tablet By Mouth Once A Day 4)  Epogen 3000 Unit/ml  Soln (Epoetin Alfa) .... 3400 Iv 3 Times A Week 5)  Protonix 40 Mg Pack (Pantoprazole Sodium) .... Take One Tablet Two Times A Day 6)  Tums Ultra 1000 1000 Mg Chew (Calcium Carbonate Antacid) .... Take One Tablet With Each Meal 7)  Clonazepam 0.5 Mg Tabs (Clonazepam) .... Take 1/2 Tablet By Mouth Two Times A Day.  Allergies (verified): No Known Drug Allergies  Review of Systems       per hpi  Physical Exam  General:  alert and overweight-appearing.   Head:  normocephalic and atraumatic.   Eyes:  vision grossly intact, pupils equal, pupils round,  pupils reactive to light, and conjunctival injection.   Mouth:  good dentition and pharynx pink and moist.   Breasts:  does have excess tissue. no masses, no discharge, no tenderness. Lungs:  normal respiratory effort and normal breath sounds.   Heart:  normal rate, regular rhythm, and no murmur.   Abdomen:  soft, non-tender, normal bowel sounds, no distention, and no masses.   Pulses:  +1 Extremities:  no edema Neurologic:  cranial nerves II-XII intact.  mute, responds to simple comands and questions. some spastic motion, not sure if this is volitional. Skin:  no suspicious lesions.   Psych:  does not speak or make eye contact.     Impression & Recommendations:  Problem # 1:  HYPOTENSION (ICD-458.9) BP is fine today. No changes.  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  will need to look into prior neuro work up to see why he is not on anything for this.  the family did not follow up with neuro at [facility].  Problem # 3:  REDNESS OR DISCHARGE OF EYE (ICD-379.93) will not tolerate eye drops.  will try benadryl PO  Problem # 4:  RENAL FAILURE (ICD-586) No on HD and followed by Dr. Reynolds Bowl.  This patient  is very anxious about medical care but tolerates HD though the current portacath.  Family wants more secure access and are interested in a graft.  I have suggested that they discuss this with Dr. Salena Saner.  Otherwise no change on this issue, going ot HD regularly.  Complete Medication List: 1)  Dialyvite Tabs (B complex-c-folic acid) .... Take 1 tablet by mouth once a day 2)  Calcium 500 Mg Tabs (Calcium) .... Take 1 tablet by mouth once a day 3)  Lipitor 20 Mg Tabs (Atorvastatin calcium) .... Take 1 tablet by mouth once a day 4)  Epogen 3000 Unit/ml Soln (Epoetin alfa) .... 3400 iv 3 times a week 5)  Protonix 40 Mg Pack (Pantoprazole sodium) .... Take one tablet two times a day 6)  Tums Ultra 1000 1000 Mg Chew (Calcium carbonate antacid) .... Take one tablet with  each meal 7)  Clonazepam 0.5 Mg Tabs (Clonazepam) .... Take 1/2 tablet by mouth two times a day.  Patient Instructions: 1)  Please schedule a follow-up appointment in 3 months. 2)  You should try benadryl 62 mg every 6 hours as needed for eye redness.  Prevention & Chronic Care Immunizations   Influenza vaccine: Not documented   Influenza vaccine deferral: Refused  (08/10/2009)    Tetanus booster: Not documented   Td booster deferral: Not indicated  (08/10/2009)    Pneumococcal vaccine: Not documented    H. zoster vaccine: Not documented  Colorectal Screening   Hemoccult: Not documented    Colonoscopy: Not documented  Other Screening   PSA: Not documented   Smoking status: never  (08/10/2009)  Lipids   Total Cholesterol: Not documented   LDL: Not documented   LDL Direct: Not documented   HDL: Not documented   Triglycerides: Not documented    SGOT (AST): Not documented   SGPT (ALT): Not documented   Alkaline phosphatase: Not documented   Total bilirubin: Not documented  Hypertension   Last Blood Pressure: 133 / 85  (11/23/2009)   Serum creatinine: Not documented   Serum potassium Not documented    Hypertension flowsheet reviewed?: Yes   Progress toward BP goal: Deteriorated  Self-Management Support :    Hypertension self-management support: Not documented    Lipid self-management support: Not documented

## 2010-11-19 NOTE — Progress Notes (Signed)
Summary: Refill/gh  Phone Note Refill Request Message from:  Fax from Pharmacy on June 21, 2010 11:41 AM  Refills Requested: Medication #1:  DIALYVITE   TABS Take 1 tablet by mouth once a day   Last Refilled: 05/20/2010  Method Requested: Electronic Initial call taken by: Angelina Ok RN,  June 21, 2010 11:41 AM  Follow-up for Phone Call        Please ask him to get this from his HD doctor. Thanks Follow-up by: Blanch Media MD,  June 21, 2010 12:56 PM    Prescriptions: DIALYVITE   TABS (B COMPLEX-C-FOLIC ACID) Take 1 tablet by mouth once a day  #30 Tablet x 10   Entered and Authorized by:   Darnelle Maffucci MD   Signed by:   Darnelle Maffucci MD on 06/24/2010   Method used:   Electronically to        [facility] (retail)       [address]       [place], [place]  [id number]       Ph: [phone] or [phone]       Fax: [phone]   RxID:   [id number]

## 2010-11-19 NOTE — Initial Assessments (Signed)
Summary: H&P  INTERNAL MEDICINE TEACHING SERVICE ADMISSION HISTORY & PHYSICAL   Attending: Dr. Julaine Fusi First contact: Dr. Baltazar Apo [phone]    Second contact: Dr. Aldine Contes [phone] [facility], after-hours: [phone], [phone])    PCP: Dr. Clent Ridges   CC: bleeding from the graft   HPI: 60 yr old AAM with PMH significant for mental retardation, mutism, ESRD on HD on TTS schedule comes to the ED with bleeding from a recently done graft site. He had a RUE graft done with Dr. Myra Gianotti two days ago 12/28/2009. Per family it started to bleed right after the procedure, nevertheless, he was sent home and they were told that the bleeding will subside. However, bleeding persisted and hence he is brought in for evaluation. In the ER he was seen by Dr. Edilia Bo, who identified that patient's right upper extremity was mottled and there was diminished radial pulsation. He was diagnosed of ac. limb ischemia and was taken to the ER. The bleeding graft was removed and is sent back to [place] for follow up. 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 (Nov 2009). H. 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    Past Surgical History:  Left forearm graft for HD R forearm graft December 28, 2009  Current Meds:  DIALYVITE   TABS (B  COMPLEX-C-FOLIC ACID) Take 1 tablet by mouth once a day CALCIUM 500 MG  TABS (CALCIUM) Take 1 tablet by mouth once a day LIPITOR 20 MG  TABS (ATORVASTATIN CALCIUM) Take 1 tablet by mouth once a day EPOGEN 3000 UNIT/ML  SOLN (EPOETIN ALFA) 3400 IV 3 times a week PROTONIX 40 MG PACK (PANTOPRAZOLE SODIUM) Take one tablet two times a day TUMS ULTRA 1000 1000 MG CHEW (CALCIUM CARBONATE ANTACID) take one tablet with each meal CLONAZEPAM 0.5 MG TABS (CLONAZEPAM) take 1/2 tablet by mouth two times a day.   Social History:  Single Alcohol use-no Drug use-no Lives at home - with care provider, Carroll Sage - for last 18 yrs.   Family History:  Non contributory to the current problem.   Review of System: Sister noted few jerky motions of his limbs. Rest per HPI.   Vital Signs: BP-101/60 HR-114 RR-18 Temp-98.4 Sat-94%/ra  Physical Exam: GEN- Not in distress HEENT- NCAT, PERRL, no icterus/pallor LUNGS- clear to auscultation.   CVS- RRR, no murmur ABD- soft, non-tender, normal bowel sounds.   EXT- no cyanosis, clubbing or edema.  NEURO- no focal deficit. PSY- appropriate.    LAB RESULTS WBC                                      10.1              4.0-10.5         K/uL  RBC  3.03       l      4.22-5.81        MIL/uL  Hemoglobin (HGB)                         9.3        l      13.0-17.0        g/dL  Hematocrit (HCT)                         28.3       l      39.0-52.0        %  MCV                                      93.3              78.0-100.0       fL  MCHC                                     32.7              30.0-36.0        g/dL  RDW                                      20.2       h      11.5-15.5        %  Platelet Count (PLT)                     156               150-400          K/uL  Neutrophils, %                           84         h      43-77            %  Lymphocytes, %                           8          l      12-46            %   Monocytes, %                             8                 3-12             %  Eosinophils, %                           0                 0-5              %  Basophils, %  0                 0-1              %  Neutrophils, Absolute                    8.6        h      1.7-7.7          K/uL  Lymphocytes, Absolute                    0.8               0.7-4.0          K/uL  Monocytes, Absolute                      0.8               0.1-1.0          K/uL  Eosinophils, Absolute                    0.0               0.0-0.7          K/uL  Basophils, Absolute                      0.0               0.0-0.1          K/uL  INR 1.31     PTT 34   TCO2                                     31                0-100            mmol/L  Ionized Calcium                          1.13              1.12-1.32        mmol/L  Hemoglobin (HGB)                         9.9        l      13.0-17.0        g/dL  Hematocrit (HCT)                         29.0       l      39.0-52.0        %  Sodium (NA)                              139               135-145          mEq/L  Potassium (K)                            4.0  3.5-5.1          mEq/L  Chloride                                 103               96-112           mEq/L  Glucose                                  97                70-99            mg/dL  BUN                                      23                6-23             mg/dL  Creatinine                               8.0        h      0.4-1.5          mg/dL    ASSESSMENT & PLAN This is a 60 yo man with R UE ischemia: due to malfunctioning graft, which has now been removed. Good pulsation noted after the procedure. Will keep limb elevated.  ESRD: Continue TTS dialysis with R femoral cath.  Myoclonic jerks: continue klonopin.  Anemia: Due to ESRD. On epo shots.  HL: continue lipitor.  Tachycardia: regular on monitor. Monitor for now. Prophylaxis: SCD, protonix     ATTENDING PHYSICIAN: I performed and/or observed a history and physical examination of the patient.  I discussed the case with the residents as noted and reviewed the residents' notes.  I agree with the findings and plan--please refer to the attending physician note for more details.  Signature________________________________  Printed Name_____________________________

## 2010-11-19 NOTE — Assessment & Plan Note (Signed)
Summary: HFU-PER DR [facility]/CFB   Vital Signs:  Patient profile:   60 year old male Height:      61 inches Weight:      138.5 pounds BMI:     26.26 Temp:     97.8 degrees F oral Pulse rate:   99 / minute BP sitting:   120 / 81  (right arm)  Vitals Entered By: Filomena Jungling NT II (Feb 18, 2010 9:46 AM) CC: HFU Is Patient Diabetic? No Pain Assessment Patient in pain? no      Nutritional Status BMI of 25 - 29 = overweight  Have you ever been in a relationship where you felt threatened, hurt or afraid?No   Does patient need assistance? Ambulation Impaired:Risk for fall Comments LIVES IN HOME   Primary Care Provider:  Elby Showers MD  CC:  HFU.  History of Present Illness: 60 yom with pmh outlined in this chart.  He is here after hospitalization for subclavian stele syndrome after placement of an av fistula for HD.  His arm has recovered, he is eating well.  He is here with his caretaker.  He is receiving HD via a temp catheter in his right groin. No recent myoclonic spells.  No major concerns from his care taker.  Preventive Screening-Counseling & Management  Alcohol-Tobacco     Smoking Status: never  Caffeine-Diet-Exercise     Does Patient Exercise: no  Current Medications (verified): 1)  Dialyvite   Tabs (B Complex-C-Folic Acid) .... Take 1 Tablet By Mouth Once A Day 2)  Calcium 500 Mg  Tabs (Calcium) .... Take 1 Tablet By Mouth Once A Day 3)  Lipitor 20 Mg  Tabs (Atorvastatin Calcium) .... Take 1 Tablet By Mouth Once A Day 4)  Epogen 3000 Unit/ml  Soln (Epoetin Alfa) .... 3400 Iv 3 Times A Week 5)  Protonix 40 Mg Pack (Pantoprazole Sodium) .... Take One Tablet Two Times A Day 6)  Tums Ultra 1000 1000 Mg Chew (Calcium Carbonate Antacid) .... Take One Tablet With Each Meal 7)  Clonazepam 0.5 Mg Tabs (Clonazepam) .... Take 1/2 Tablet By Mouth Two Times A Day. 8)  Zemplar 2 Mcg/ml Soln (Paricalcitol) .... 3 Micrograms Iv T, Th, S With Dialysis  Allergies: No Known  Drug Allergies  Review of Systems       per hpi  Physical Exam  General:  alert and well-developed.   Head:  normocephalic and atraumatic.   Eyes:  exopthalamos with injected sclera Lungs:  normal respiratory effort and normal breath sounds.   Heart:  normal rate, regular rhythm, and no murmur.   Abdomen:  soft, non-tender, and normal bowel sounds.   Extremities:  no edema on LE's. +1 edema over the right UE with good strength and movement. Neurologic:  cranial nerves II-XII intact.  does not speak, but responds to caretakers comands, smiles and waves.  gait is fairly normal. Skin:  no suspicious lesions.     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  Still taking the clonazepam for this, which I have refilled today.  Now they will have enough refills to get them to next appointment in 6 months.  Problem # 2:  RENAL FAILURE (ICD-586) Follows with dr. Reynolds Bowl.  Seems to be doing well.  Still working towards stable perminent access, will likely need a graft in the thigh next.  Problem # 3:  HYPERTENSION (ICD-401.9) BP is fine today.  Controled with HD.  BP  today: 120/81 Prior BP: 97/63 (12/12/2009)  Problem # 4:  HYPERLIPIDEMIA (ICD-272.4) Not sure when last time lipids were checked, he is scared of needles, so we do not do blood work in this clinic.  may need to get it checked at HD in the future.  His updated medication list for this problem includes:    Lipitor 20 Mg Tabs (Atorvastatin calcium) .Marland Kitchen... Take 1 tablet by mouth once a day  Complete Medication List: 1)  Dialyvite Tabs (B complex-c-folic acid) .... Take 1 tablet by mouth once a day 2)  Calcium 500 Mg Tabs (Calcium) .... Take 1 tablet by mouth once a day 3)  Lipitor 20 Mg Tabs (Atorvastatin calcium) .... Take 1 tablet by mouth once a day 4)  Epogen 3000 Unit/ml Soln (Epoetin alfa) .... 3400 iv 3 times a week 5)  Protonix 40 Mg Pack (Pantoprazole sodium)  .... Take one tablet two times a day 6)  Tums Ultra 1000 1000 Mg Chew (Calcium carbonate antacid) .... Take one tablet with each meal 7)  Clonazepam 0.5 Mg Tabs (Clonazepam) .... Take 1/2 tablet by mouth two times a day. 8)  Zemplar 2 Mcg/ml Soln (Paricalcitol) .... 3 micrograms iv t, th, s with dialysis  Patient Instructions: 1)  Please schedule a follow-up appointment in 6 months. Prescriptions: CLONAZEPAM 0.5 MG TABS (CLONAZEPAM) take 1/2 tablet by mouth two times a day.  #32 x 3   Entered and Authorized by:   Elby Showers MD   Signed by:   Elby Showers MD on 02/18/2010   Method used:   Handwritten   RxID:   [id number]

## 2010-11-19 NOTE — Progress Notes (Signed)
Summary: Refill/gh  Phone Note Refill Request Message from:  Fax from Pharmacy on September 06, 2010 3:57 PM  Refills Requested: Medication #1:  PROTONIX 40 MG PACK Take one tablet two times a day  Method Requested: Electronic Initial call taken by: Angelina Ok RN,  September 06, 2010 3:59 PM    Prescriptions: PROTONIX 40 MG PACK (PANTOPRAZOLE SODIUM) Take one tablet two times a day  #60 Packet x 5   Entered and Authorized by:   Darnelle Maffucci MD   Signed by:   Darnelle Maffucci MD on 09/09/2010   Method used:   Electronically to        [facility] (retail)       [address]       [place], [place]  [id number]       Ph: [phone] or [phone]       Fax: [phone]   RxID:   [id number]

## 2010-11-21 NOTE — Progress Notes (Signed)
Summary: refill/gg  Phone Note Refill Request  on November 05, 2010 4:18 PM  Refills Requested: Medication #1:  LIPITOR 20 MG  TABS Take 1 tablet by mouth once a day   Last Refilled: 10/01/2010  Method Requested: Electronic Initial call taken by: Merrie Roof RN,  November 05, 2010 4:18 PM    Prescriptions: LIPITOR 20 MG  TABS (ATORVASTATIN CALCIUM) Take 1 tablet by mouth once a day  #30 Tablet x 2   Entered and Authorized by:   Darnelle Maffucci MD   Signed by:   Darnelle Maffucci MD on 11/07/2010   Method used:   Electronically to        [facility] (retail)       [address]       [place], [place]  [id number]       Ph: [phone] or [phone]       Fax: [phone]   RxID:   [id number]

## 2010-11-22 ENCOUNTER — Inpatient Hospital Stay (HOSPITAL_COMMUNITY): Payer: Medicare Other

## 2010-11-22 ENCOUNTER — Inpatient Hospital Stay (HOSPITAL_COMMUNITY)
Admission: RE | Admit: 2010-11-22 | Discharge: 2010-12-18 | DRG: 673 | Disposition: A | Discharge status: Home Health | Payer: Medicare Other | Source: Ambulatory Visit | Attending: Vascular Surgery | Admitting: Vascular Surgery

## 2010-11-22 DIAGNOSIS — M216X9 Other acquired deformities of unspecified foot: Secondary | ICD-10-CM | POA: Diagnosis present

## 2010-11-22 DIAGNOSIS — J690 Pneumonitis due to inhalation of food and vomit: Secondary | ICD-10-CM | POA: Diagnosis not present

## 2010-11-22 DIAGNOSIS — N186 End stage renal disease: Secondary | ICD-10-CM | POA: Diagnosis present

## 2010-11-22 DIAGNOSIS — A419 Sepsis, unspecified organism: Secondary | ICD-10-CM | POA: Diagnosis not present

## 2010-11-22 DIAGNOSIS — R791 Abnormal coagulation profile: Secondary | ICD-10-CM | POA: Diagnosis present

## 2010-11-22 DIAGNOSIS — R680 Hypothermia, not associated with low environmental temperature: Secondary | ICD-10-CM | POA: Diagnosis not present

## 2010-11-22 DIAGNOSIS — T8110XA Postprocedural shock unspecified, initial encounter: Secondary | ICD-10-CM | POA: Diagnosis not present

## 2010-11-22 DIAGNOSIS — R1310 Dysphagia, unspecified: Secondary | ICD-10-CM | POA: Diagnosis present

## 2010-11-22 DIAGNOSIS — F79 Unspecified intellectual disabilities: Secondary | ICD-10-CM | POA: Diagnosis present

## 2010-11-22 DIAGNOSIS — Y849 Medical procedure, unspecified as the cause of abnormal reaction of the patient, or of later complication, without mention of misadventure at the time of the procedure: Secondary | ICD-10-CM | POA: Diagnosis not present

## 2010-11-22 DIAGNOSIS — I120 Hypertensive chronic kidney disease with stage 5 chronic kidney disease or end stage renal disease: Secondary | ICD-10-CM

## 2010-11-22 DIAGNOSIS — I469 Cardiac arrest, cause unspecified: Secondary | ICD-10-CM | POA: Diagnosis not present

## 2010-11-22 DIAGNOSIS — R4701 Aphasia: Secondary | ICD-10-CM | POA: Diagnosis present

## 2010-11-22 DIAGNOSIS — E872 Acidosis, unspecified: Secondary | ICD-10-CM | POA: Diagnosis present

## 2010-11-22 DIAGNOSIS — D696 Thrombocytopenia, unspecified: Secondary | ICD-10-CM | POA: Diagnosis present

## 2010-11-22 DIAGNOSIS — Z66 Do not resuscitate: Secondary | ICD-10-CM | POA: Diagnosis present

## 2010-11-22 DIAGNOSIS — G931 Anoxic brain damage, not elsewhere classified: Secondary | ICD-10-CM | POA: Diagnosis present

## 2010-11-22 DIAGNOSIS — E46 Unspecified protein-calorie malnutrition: Secondary | ICD-10-CM | POA: Diagnosis not present

## 2010-11-22 DIAGNOSIS — I498 Other specified cardiac arrhythmias: Secondary | ICD-10-CM | POA: Diagnosis present

## 2010-11-22 DIAGNOSIS — J9600 Acute respiratory failure, unspecified whether with hypoxia or hypercapnia: Secondary | ICD-10-CM | POA: Diagnosis not present

## 2010-11-22 DIAGNOSIS — N2581 Secondary hyperparathyroidism of renal origin: Secondary | ICD-10-CM | POA: Diagnosis present

## 2010-11-22 DIAGNOSIS — Z6831 Body mass index (BMI) 31.0-31.9, adult: Secondary | ICD-10-CM

## 2010-11-22 DIAGNOSIS — Y9210 Unspecified residential institution as the place of occurrence of the external cause: Secondary | ICD-10-CM | POA: Diagnosis not present

## 2010-11-22 DIAGNOSIS — K922 Gastrointestinal hemorrhage, unspecified: Secondary | ICD-10-CM | POA: Diagnosis present

## 2010-11-22 DIAGNOSIS — Z992 Dependence on renal dialysis: Secondary | ICD-10-CM

## 2010-11-22 DIAGNOSIS — R5381 Other malaise: Secondary | ICD-10-CM | POA: Diagnosis present

## 2010-11-22 DIAGNOSIS — Z781 Physical restraint status: Secondary | ICD-10-CM | POA: Diagnosis not present

## 2010-11-22 DIAGNOSIS — E875 Hyperkalemia: Secondary | ICD-10-CM | POA: Diagnosis present

## 2010-11-22 DIAGNOSIS — I519 Heart disease, unspecified: Secondary | ICD-10-CM | POA: Diagnosis not present

## 2010-11-22 DIAGNOSIS — D62 Acute posthemorrhagic anemia: Secondary | ICD-10-CM | POA: Diagnosis present

## 2010-11-22 HISTORY — PX: ARTERIOVENOUS GRAFT PLACEMENT: SUR1029

## 2010-11-22 LAB — COMPREHENSIVE METABOLIC PANEL
ALT: 17 U/L (ref 0–53)
Albumin: 3.7 g/dL (ref 3.5–5.2)
Alkaline Phosphatase: 91 U/L (ref 39–117)
BUN: 20 mg/dL (ref 6–23)
Calcium: 8.8 mg/dL (ref 8.4–10.5)
Glucose, Bld: 119 mg/dL — ABNORMAL HIGH (ref 70–99)
Potassium: 4.3 mEq/L (ref 3.5–5.1)
Sodium: 143 mEq/L (ref 135–145)
Total Protein: 7.8 g/dL (ref 6.0–8.3)

## 2010-11-22 LAB — CARDIAC PANEL(CRET KIN+CKTOT+MB+TROPI)
CK, MB: 1.4 ng/mL (ref 0.3–4.0)
Relative Index: INVALID (ref 0.0–2.5)
Troponin I: 0.16 ng/mL — ABNORMAL HIGH (ref 0.00–0.06)

## 2010-11-22 LAB — SURGICAL PCR SCREEN
MRSA, PCR: NEGATIVE
Staphylococcus aureus: NEGATIVE

## 2010-11-22 LAB — CBC
Hemoglobin: 12.2 g/dL — ABNORMAL LOW (ref 13.0–17.0)
Platelets: 204 10*3/uL (ref 150–400)
RBC: 4.22 MIL/uL (ref 4.22–5.81)
WBC: 8 10*3/uL (ref 4.0–10.5)

## 2010-11-22 LAB — GLUCOSE, CAPILLARY: Glucose-Capillary: 122 mg/dL — ABNORMAL HIGH (ref 70–99)

## 2010-11-22 LAB — LACTIC ACID, PLASMA: Lactic Acid, Venous: 4.3 mmol/L — ABNORMAL HIGH (ref 0.5–2.2)

## 2010-11-23 DIAGNOSIS — N186 End stage renal disease: Secondary | ICD-10-CM

## 2010-11-23 DIAGNOSIS — R579 Shock, unspecified: Secondary | ICD-10-CM

## 2010-11-23 LAB — CBC
HCT: 36.4 % — ABNORMAL LOW (ref 39.0–52.0)
Hemoglobin: 11.3 g/dL — ABNORMAL LOW (ref 13.0–17.0)
MCH: 29 pg (ref 26.0–34.0)
MCHC: 31 g/dL (ref 30.0–36.0)
RDW: 16.2 % — ABNORMAL HIGH (ref 11.5–15.5)

## 2010-11-23 LAB — BASIC METABOLIC PANEL
BUN: 24 mg/dL — ABNORMAL HIGH (ref 6–23)
CO2: 24 mEq/L (ref 19–32)
Calcium: 9 mg/dL (ref 8.4–10.5)
Creatinine, Ser: 10.95 mg/dL — ABNORMAL HIGH (ref 0.4–1.5)
GFR calc non Af Amer: 5 mL/min — ABNORMAL LOW (ref >60)
Glucose, Bld: 143 mg/dL — ABNORMAL HIGH (ref 70–99)

## 2010-11-23 LAB — CORTISOL
Cortisol, Plasma: 16.9 ug/dL
Cortisol, Plasma: 13.5 ug/dL

## 2010-11-23 LAB — GLUCOSE, CAPILLARY: Glucose-Capillary: 111 mg/dL — ABNORMAL HIGH (ref 70–99)

## 2010-11-23 LAB — PROCALCITONIN: Procalcitonin: 1.49 ng/mL

## 2010-11-23 LAB — LACTIC ACID, PLASMA: Lactic Acid, Venous: 1.9 mmol/L (ref 0.5–2.2)

## 2010-11-24 ENCOUNTER — Inpatient Hospital Stay (HOSPITAL_COMMUNITY): Payer: Medicare Other

## 2010-11-24 DIAGNOSIS — K920 Hematemesis: Secondary | ICD-10-CM

## 2010-11-24 DIAGNOSIS — D62 Acute posthemorrhagic anemia: Secondary | ICD-10-CM

## 2010-11-24 DIAGNOSIS — I959 Hypotension, unspecified: Secondary | ICD-10-CM

## 2010-11-24 LAB — GLUCOSE, CAPILLARY
Glucose-Capillary: 194 mg/dL — ABNORMAL HIGH (ref 70–99)
Glucose-Capillary: 189 mg/dL — ABNORMAL HIGH (ref 70–99)

## 2010-11-24 LAB — CBC
HCT: 30.9 % — ABNORMAL LOW (ref 39.0–52.0)
Hemoglobin: 8.1 g/dL — ABNORMAL LOW (ref 13.0–17.0)
Hemoglobin: 6.5 g/dL — CL (ref 13.0–17.0)
MCH: 29.4 pg (ref 26.0–34.0)
MCH: 29.3 pg (ref 26.0–34.0)
MCHC: 30.2 g/dL (ref 30.0–36.0)
MCV: 95.4 fL (ref 78.0–100.0)
MCV: 98 fL (ref 78.0–100.0)
Platelets: 196 10*3/uL (ref 150–400)
Platelets: 145 10*3/uL — ABNORMAL LOW (ref 150–400)
Platelets: 67 10*3/uL — ABNORMAL LOW (ref 150–400)
RBC: 2.48 MIL/uL — ABNORMAL LOW (ref 4.22–5.81)
RBC: 2.82 MIL/uL — ABNORMAL LOW (ref 4.22–5.81)
RBC: 3.27 MIL/uL — ABNORMAL LOW (ref 4.22–5.81)
RDW: 15.7 % — ABNORMAL HIGH (ref 11.5–15.5)
RDW: 16.3 % — ABNORMAL HIGH (ref 11.5–15.5)
RDW: 16.6 % — ABNORMAL HIGH (ref 11.5–15.5)
WBC: 12.1 10*3/uL — ABNORMAL HIGH (ref 4.0–10.5)
WBC: 12.5 10*3/uL — ABNORMAL HIGH (ref 4.0–10.5)
WBC: 12.7 10*3/uL — ABNORMAL HIGH (ref 4.0–10.5)

## 2010-11-24 LAB — BASIC METABOLIC PANEL
BUN: 65 mg/dL — ABNORMAL HIGH (ref 6–23)
BUN: 63 mg/dL — ABNORMAL HIGH (ref 6–23)
CO2: 14 mEq/L — ABNORMAL LOW (ref 19–32)
Calcium: 8.7 mg/dL (ref 8.4–10.5)
Chloride: 103 mEq/L (ref 96–112)
Chloride: 105 mEq/L (ref 96–112)
Chloride: 106 mEq/L (ref 96–112)
Creatinine, Ser: 12.78 mg/dL — ABNORMAL HIGH (ref 0.4–1.5)
Creatinine, Ser: 11.89 mg/dL — ABNORMAL HIGH (ref 0.4–1.5)
GFR calc Af Amer: 5 mL/min — ABNORMAL LOW (ref >60)
GFR calc non Af Amer: 4 mL/min — ABNORMAL LOW (ref >60)
GFR calc non Af Amer: 4 mL/min — ABNORMAL LOW (ref >60)
Glucose, Bld: 168 mg/dL — ABNORMAL HIGH (ref 70–99)
Glucose, Bld: 184 mg/dL — ABNORMAL HIGH (ref 70–99)
Potassium: 5.7 mEq/L — ABNORMAL HIGH (ref 3.5–5.1)
Potassium: 5.5 mEq/L — ABNORMAL HIGH (ref 3.5–5.1)
Sodium: 140 mEq/L (ref 135–145)

## 2010-11-24 LAB — LACTIC ACID, PLASMA: Lactic Acid, Venous: 4.2 mmol/L — ABNORMAL HIGH (ref 0.5–2.2)

## 2010-11-24 LAB — POCT I-STAT 3, ART BLOOD GAS (G3+)
Acid-base deficit: 15 mmol/L — ABNORMAL HIGH (ref 0.0–2.0)
O2 Saturation: 20 %
O2 Saturation: 98 %
TCO2: 16 mmol/L (ref 0–100)
pO2, Arterial: 24 mmHg — CL (ref 80.0–100.0)

## 2010-11-24 LAB — PROTIME-INR
INR: 1.43 (ref 0.00–1.49)
Prothrombin Time: 21.8 seconds — ABNORMAL HIGH (ref 11.6–15.2)

## 2010-11-25 ENCOUNTER — Inpatient Hospital Stay (HOSPITAL_COMMUNITY): Payer: Medicare Other

## 2010-11-25 LAB — RENAL FUNCTION PANEL
BUN: 69 mg/dL — ABNORMAL HIGH (ref 6–23)
CO2: 17 mEq/L — ABNORMAL LOW (ref 19–32)
Calcium: 8.8 mg/dL (ref 8.4–10.5)
Creatinine, Ser: 12.43 mg/dL — ABNORMAL HIGH (ref 0.4–1.5)
GFR calc Af Amer: 5 mL/min — ABNORMAL LOW (ref >60)
GFR calc non Af Amer: 4 mL/min — ABNORMAL LOW (ref >60)
Glucose, Bld: 257 mg/dL — ABNORMAL HIGH (ref 70–99)

## 2010-11-25 LAB — POCT I-STAT 3, ART BLOOD GAS (G3+)
Acid-base deficit: 6 mmol/L — ABNORMAL HIGH (ref 0.0–2.0)
Bicarbonate: 17.2 mEq/L — ABNORMAL LOW (ref 20.0–24.0)
O2 Saturation: 94 %
Patient temperature: 98.6
TCO2: 18 mmol/L (ref 0–100)

## 2010-11-25 LAB — PREPARE FRESH FROZEN PLASMA: Unit division: 0

## 2010-11-25 LAB — CBC
Hemoglobin: 13 g/dL (ref 13.0–17.0)
MCH: 30.3 pg (ref 26.0–34.0)
MCHC: 35 g/dL (ref 30.0–36.0)
MCV: 86.5 fL (ref 78.0–100.0)

## 2010-11-25 LAB — GLUCOSE, CAPILLARY
Glucose-Capillary: 203 mg/dL — ABNORMAL HIGH (ref 70–99)
Glucose-Capillary: 205 mg/dL — ABNORMAL HIGH (ref 70–99)

## 2010-11-26 ENCOUNTER — Inpatient Hospital Stay (HOSPITAL_COMMUNITY): Payer: Medicare Other

## 2010-11-26 LAB — POCT I-STAT 3, ART BLOOD GAS (G3+)
Patient temperature: 98.8
pCO2 arterial: 26.2 mmHg — ABNORMAL LOW (ref 35.0–45.0)
pH, Arterial: 7.445 (ref 7.350–7.450)

## 2010-11-26 LAB — RENAL FUNCTION PANEL
Albumin: 2.1 g/dL — ABNORMAL LOW (ref 3.5–5.2)
BUN: 75 mg/dL — ABNORMAL HIGH (ref 6–23)
Chloride: 98 mEq/L (ref 96–112)
Creatinine, Ser: 13.29 mg/dL — ABNORMAL HIGH (ref 0.4–1.5)
GFR calc Af Amer: 5 mL/min — ABNORMAL LOW (ref >60)
GFR calc non Af Amer: 4 mL/min — ABNORMAL LOW (ref >60)
Phosphorus: 6.4 mg/dL — ABNORMAL HIGH (ref 2.3–4.6)
Potassium: 4.2 mEq/L (ref 3.5–5.1)

## 2010-11-26 LAB — CBC
MCH: 30 pg (ref 26.0–34.0)
Platelets: 54 10*3/uL — ABNORMAL LOW (ref 150–400)
RBC: 3.77 MIL/uL — ABNORMAL LOW (ref 4.22–5.81)
RDW: 16.1 % — ABNORMAL HIGH (ref 11.5–15.5)
WBC: 9.1 10*3/uL (ref 4.0–10.5)

## 2010-11-26 LAB — GLUCOSE, CAPILLARY
Glucose-Capillary: 162 mg/dL — ABNORMAL HIGH (ref 70–99)
Glucose-Capillary: 157 mg/dL — ABNORMAL HIGH (ref 70–99)

## 2010-11-27 ENCOUNTER — Inpatient Hospital Stay (HOSPITAL_COMMUNITY): Payer: Medicare Other

## 2010-11-27 LAB — RENAL FUNCTION PANEL
CO2: 19 mEq/L (ref 19–32)
CO2: 22 mEq/L (ref 19–32)
Calcium: 8.1 mg/dL — ABNORMAL LOW (ref 8.4–10.5)
Chloride: 100 mEq/L (ref 96–112)
Chloride: 101 mEq/L (ref 96–112)
Creatinine, Ser: 8.7 mg/dL — ABNORMAL HIGH (ref 0.4–1.5)
GFR calc Af Amer: 6 mL/min — ABNORMAL LOW (ref >60)
GFR calc non Af Amer: 5 mL/min — ABNORMAL LOW (ref >60)
GFR calc non Af Amer: 6 mL/min — ABNORMAL LOW (ref >60)
Glucose, Bld: 155 mg/dL — ABNORMAL HIGH (ref 70–99)
Glucose, Bld: 151 mg/dL — ABNORMAL HIGH (ref 70–99)
Potassium: 4 mEq/L (ref 3.5–5.1)
Sodium: 136 mEq/L (ref 135–145)

## 2010-11-27 LAB — CROSSMATCH
ABO/RH(D): B POS
Unit division: 0
Unit division: 0
Unit division: 0

## 2010-11-27 LAB — GLUCOSE, CAPILLARY
Glucose-Capillary: 131 mg/dL — ABNORMAL HIGH (ref 70–99)
Glucose-Capillary: 134 mg/dL — ABNORMAL HIGH (ref 70–99)
Glucose-Capillary: 135 mg/dL — ABNORMAL HIGH (ref 70–99)
Glucose-Capillary: 142 mg/dL — ABNORMAL HIGH (ref 70–99)
Glucose-Capillary: 157 mg/dL — ABNORMAL HIGH (ref 70–99)

## 2010-11-27 LAB — CBC
HCT: 35.2 % — ABNORMAL LOW (ref 39.0–52.0)
Hemoglobin: 12.1 g/dL — ABNORMAL LOW (ref 13.0–17.0)
MCV: 87.1 fL (ref 78.0–100.0)
RBC: 4.04 MIL/uL — ABNORMAL LOW (ref 4.22–5.81)
WBC: 11.4 10*3/uL — ABNORMAL HIGH (ref 4.0–10.5)

## 2010-11-28 ENCOUNTER — Inpatient Hospital Stay (HOSPITAL_COMMUNITY): Payer: Medicare Other

## 2010-11-28 LAB — POCT I-STAT 3, ART BLOOD GAS (G3+)
Bicarbonate: 23.3 mEq/L (ref 20.0–24.0)
O2 Saturation: 99 %
O2 Saturation: 98 %
Patient temperature: 98
TCO2: 24 mmol/L (ref 0–100)
TCO2: 24 mmol/L (ref 0–100)
pCO2 arterial: 23.1 mmHg — ABNORMAL LOW (ref 35.0–45.0)
pCO2 arterial: 30.7 mmHg — ABNORMAL LOW (ref 35.0–45.0)
pH, Arterial: 7.603 (ref 7.350–7.450)
pH, Arterial: 7.487 — ABNORMAL HIGH (ref 7.350–7.450)
pO2, Arterial: 96 mmHg (ref 80.0–100.0)

## 2010-11-28 LAB — RENAL FUNCTION PANEL
BUN: 54 mg/dL — ABNORMAL HIGH (ref 6–23)
CO2: 20 mEq/L (ref 19–32)
Calcium: 8.4 mg/dL (ref 8.4–10.5)
Chloride: 103 mEq/L (ref 96–112)
Creatinine, Ser: 5.87 mg/dL — ABNORMAL HIGH (ref 0.4–1.5)
GFR calc Af Amer: 12 mL/min — ABNORMAL LOW (ref >60)
Glucose, Bld: 176 mg/dL — ABNORMAL HIGH (ref 70–99)
Glucose, Bld: 204 mg/dL — ABNORMAL HIGH (ref 70–99)
Phosphorus: 4.5 mg/dL (ref 2.3–4.6)
Phosphorus: 4.9 mg/dL — ABNORMAL HIGH (ref 2.3–4.6)
Potassium: 4.1 mEq/L (ref 3.5–5.1)
Sodium: 137 mEq/L (ref 135–145)
Sodium: 137 mEq/L (ref 135–145)

## 2010-11-28 LAB — GLUCOSE, CAPILLARY
Glucose-Capillary: 148 mg/dL — ABNORMAL HIGH (ref 70–99)
Glucose-Capillary: 148 mg/dL — ABNORMAL HIGH (ref 70–99)
Glucose-Capillary: 136 mg/dL — ABNORMAL HIGH (ref 70–99)
Glucose-Capillary: 182 mg/dL — ABNORMAL HIGH (ref 70–99)

## 2010-11-28 LAB — CBC
MCH: 30.1 pg (ref 26.0–34.0)
MCV: 87.2 fL (ref 78.0–100.0)
Platelets: 40 10*3/uL — ABNORMAL LOW (ref 150–400)
RBC: 3.76 MIL/uL — ABNORMAL LOW (ref 4.22–5.81)
RDW: 16.2 % — ABNORMAL HIGH (ref 11.5–15.5)

## 2010-11-28 LAB — CULTURE, BLOOD (ROUTINE X 2): Culture: NO GROWTH

## 2010-11-29 ENCOUNTER — Inpatient Hospital Stay (HOSPITAL_COMMUNITY): Payer: Medicare Other

## 2010-11-29 LAB — CBC
HCT: 34.7 % — ABNORMAL LOW (ref 39.0–52.0)
MCHC: 33.1 g/dL (ref 30.0–36.0)
Platelets: 44 10*3/uL — ABNORMAL LOW (ref 150–400)
RDW: 16.7 % — ABNORMAL HIGH (ref 11.5–15.5)
WBC: 11 10*3/uL — ABNORMAL HIGH (ref 4.0–10.5)

## 2010-11-29 LAB — RENAL FUNCTION PANEL
Albumin: 2.4 g/dL — ABNORMAL LOW (ref 3.5–5.2)
BUN: 42 mg/dL — ABNORMAL HIGH (ref 6–23)
CO2: 23 mEq/L (ref 19–32)
Calcium: 8.7 mg/dL (ref 8.4–10.5)
Calcium: 8.7 mg/dL (ref 8.4–10.5)
Creatinine, Ser: 4.98 mg/dL — ABNORMAL HIGH (ref 0.4–1.5)
GFR calc Af Amer: 14 mL/min — ABNORMAL LOW (ref >60)
GFR calc non Af Amer: 12 mL/min — ABNORMAL LOW (ref >60)
Glucose, Bld: 209 mg/dL — ABNORMAL HIGH (ref 70–99)
Phosphorus: 4.3 mg/dL (ref 2.3–4.6)
Phosphorus: 4.3 mg/dL (ref 2.3–4.6)
Sodium: 136 mEq/L (ref 135–145)
Sodium: 135 mEq/L (ref 135–145)

## 2010-11-29 LAB — HEPARIN INDUCED THROMBOCYTOPENIA PNL
Heparin Induced Plt Ab: NEGATIVE
Patient O.D.: 0.147
UFH Low Dose 0.1 IU/mL: 5 % Release
UFH SRA Result: NEGATIVE

## 2010-11-29 LAB — GLUCOSE, CAPILLARY
Glucose-Capillary: 143 mg/dL — ABNORMAL HIGH (ref 70–99)
Glucose-Capillary: 161 mg/dL — ABNORMAL HIGH (ref 70–99)
Glucose-Capillary: 173 mg/dL — ABNORMAL HIGH (ref 70–99)
Glucose-Capillary: 177 mg/dL — ABNORMAL HIGH (ref 70–99)

## 2010-11-29 LAB — MAGNESIUM: Magnesium: 2.6 mg/dL — ABNORMAL HIGH (ref 1.5–2.5)

## 2010-11-30 ENCOUNTER — Inpatient Hospital Stay (HOSPITAL_COMMUNITY): Payer: Medicare Other

## 2010-11-30 DIAGNOSIS — J9600 Acute respiratory failure, unspecified whether with hypoxia or hypercapnia: Secondary | ICD-10-CM

## 2010-11-30 LAB — GLUCOSE, CAPILLARY
Glucose-Capillary: 141 mg/dL — ABNORMAL HIGH (ref 70–99)
Glucose-Capillary: 157 mg/dL — ABNORMAL HIGH (ref 70–99)
Glucose-Capillary: 165 mg/dL — ABNORMAL HIGH (ref 70–99)

## 2010-11-30 LAB — RENAL FUNCTION PANEL
BUN: 49 mg/dL — ABNORMAL HIGH (ref 6–23)
Calcium: 8.4 mg/dL (ref 8.4–10.5)
GFR calc Af Amer: 19 mL/min — ABNORMAL LOW (ref >60)
Glucose, Bld: 226 mg/dL — ABNORMAL HIGH (ref 70–99)
Phosphorus: 3.5 mg/dL (ref 2.3–4.6)
Sodium: 141 mEq/L (ref 135–145)

## 2010-11-30 LAB — CBC
Hemoglobin: 11.4 g/dL — ABNORMAL LOW (ref 13.0–17.0)
MCH: 30.4 pg (ref 26.0–34.0)
MCHC: 33.1 g/dL (ref 30.0–36.0)
Platelets: 81 10*3/uL — ABNORMAL LOW (ref 150–400)
RBC: 3.75 MIL/uL — ABNORMAL LOW (ref 4.22–5.81)

## 2010-11-30 LAB — MAGNESIUM: Magnesium: 2.7 mg/dL — ABNORMAL HIGH (ref 1.5–2.5)

## 2010-12-01 ENCOUNTER — Inpatient Hospital Stay (HOSPITAL_COMMUNITY): Payer: Medicare Other

## 2010-12-01 LAB — COMPREHENSIVE METABOLIC PANEL
ALT: 34 U/L (ref 0–53)
AST: 32 U/L (ref 0–37)
Calcium: 8.3 mg/dL — ABNORMAL LOW (ref 8.4–10.5)
GFR calc Af Amer: 23 mL/min — ABNORMAL LOW (ref >60)
Sodium: 138 mEq/L (ref 135–145)
Total Protein: 5.8 g/dL — ABNORMAL LOW (ref 6.0–8.3)

## 2010-12-01 LAB — MAGNESIUM: Magnesium: 2.6 mg/dL — ABNORMAL HIGH (ref 1.5–2.5)

## 2010-12-01 LAB — RENAL FUNCTION PANEL
BUN: 41 mg/dL — ABNORMAL HIGH (ref 6–23)
CO2: 23 mEq/L (ref 19–32)
Chloride: 105 mEq/L (ref 96–112)
Glucose, Bld: 151 mg/dL — ABNORMAL HIGH (ref 70–99)
Phosphorus: 4.8 mg/dL — ABNORMAL HIGH (ref 2.3–4.6)
Potassium: 4.1 mEq/L (ref 3.5–5.1)

## 2010-12-01 LAB — GLUCOSE, CAPILLARY
Glucose-Capillary: 93 mg/dL (ref 70–99)
Glucose-Capillary: 146 mg/dL — ABNORMAL HIGH (ref 70–99)
Glucose-Capillary: 135 mg/dL — ABNORMAL HIGH (ref 70–99)
Glucose-Capillary: 55 mg/dL — ABNORMAL LOW (ref 70–99)
Glucose-Capillary: 108 mg/dL — ABNORMAL HIGH (ref 70–99)
Glucose-Capillary: 27 mg/dL — CL (ref 70–99)

## 2010-12-01 LAB — CBC
MCHC: 32.5 g/dL (ref 30.0–36.0)
RDW: 16.7 % — ABNORMAL HIGH (ref 11.5–15.5)

## 2010-12-02 ENCOUNTER — Encounter: Payer: Self-pay | Admitting: Internal Medicine

## 2010-12-02 LAB — RENAL FUNCTION PANEL
BUN: 39 mg/dL — ABNORMAL HIGH (ref 6–23)
BUN: 41 mg/dL — ABNORMAL HIGH (ref 6–23)
CO2: 21 mEq/L (ref 19–32)
CO2: 22 mEq/L (ref 19–32)
Calcium: 8.6 mg/dL (ref 8.4–10.5)
Chloride: 105 mEq/L (ref 96–112)
Creatinine, Ser: 3 mg/dL — ABNORMAL HIGH (ref 0.4–1.5)
Glucose, Bld: 167 mg/dL — ABNORMAL HIGH (ref 70–99)
Phosphorus: 5.3 mg/dL — ABNORMAL HIGH (ref 2.3–4.6)

## 2010-12-02 LAB — CBC
MCH: 31 pg (ref 26.0–34.0)
MCHC: 33.3 g/dL (ref 30.0–36.0)
MCV: 93.1 fL (ref 78.0–100.0)
Platelets: 127 10*3/uL — ABNORMAL LOW (ref 150–400)
RDW: 17 % — ABNORMAL HIGH (ref 11.5–15.5)

## 2010-12-02 LAB — GLUCOSE, CAPILLARY
Glucose-Capillary: 103 mg/dL — ABNORMAL HIGH (ref 70–99)
Glucose-Capillary: 128 mg/dL — ABNORMAL HIGH (ref 70–99)
Glucose-Capillary: 105 mg/dL — ABNORMAL HIGH (ref 70–99)
Glucose-Capillary: 28 mg/dL — CL (ref 70–99)

## 2010-12-03 LAB — CBC
MCH: 30.5 pg (ref 26.0–34.0)
MCHC: 32.6 g/dL (ref 30.0–36.0)
MCV: 93.6 fL (ref 78.0–100.0)
Platelets: 139 10*3/uL — ABNORMAL LOW (ref 150–400)
RDW: 17.2 % — ABNORMAL HIGH (ref 11.5–15.5)

## 2010-12-03 LAB — RENAL FUNCTION PANEL
Albumin: 2.4 g/dL — ABNORMAL LOW (ref 3.5–5.2)
BUN: 40 mg/dL — ABNORMAL HIGH (ref 6–23)
BUN: 38 mg/dL — ABNORMAL HIGH (ref 6–23)
Creatinine, Ser: 2.95 mg/dL — ABNORMAL HIGH (ref 0.4–1.5)
Glucose, Bld: 193 mg/dL — ABNORMAL HIGH (ref 70–99)
Phosphorus: 4.8 mg/dL — ABNORMAL HIGH (ref 2.3–4.6)
Phosphorus: 4.2 mg/dL (ref 2.3–4.6)
Potassium: 4.3 mEq/L (ref 3.5–5.1)
Potassium: 4.2 mEq/L (ref 3.5–5.1)
Sodium: 137 mEq/L (ref 135–145)

## 2010-12-03 LAB — GLUCOSE, CAPILLARY
Glucose-Capillary: 141 mg/dL — ABNORMAL HIGH (ref 70–99)
Glucose-Capillary: 49 mg/dL — ABNORMAL LOW (ref 70–99)

## 2010-12-03 NOTE — Op Note (Signed)
  [name], [name]                 ACCOUNT NO.:  [id number]  MEDICAL RECORD NO.:  [id number]           PATIENT TYPE:  I  LOCATION:  2311                         FACILITY:  [facility]  PHYSICIAN:  Di Kindle. Edilia Bo, M.D.DATE OF BIRTH:  July 13, 1951  DATE OF PROCEDURE:  11/22/2010 DATE OF DISCHARGE:                              OPERATIVE REPORT   PREOPERATIVE DIAGNOSIS:  Chronic kidney disease.  POSTOPERATIVE DIAGNOSIS:  Chronic kidney disease.  PROCEDURE:  Placement of a new right thigh AV graft.  SURGEON:  Di Kindle. Edilia Bo, MD  ASSISTANT:  Lujean Rave, PA  ANESTHESIA:  General.  TECHNIQUE:  The patient was taken to the operating room, sedated by Anesthesia, and then received a general anesthetic.  The right thigh was prepped and draped in usual sterile fashion.  A longitudinal incision was made in the right groin and through this incision, the common femoral artery and adjacent common femoral vein were dissected free. Using one distal counterincision, a 47-mm graft was tunneled in a loop fashion in the thigh with the arterial aspect of the graft along the lateral aspect of the thigh.  The patient was then heparinized.  The common femoral artery was clamped proximally and distally and a longitudinal arteriotomy was made.  A segment of the 4-mm of the graft was excised.  The graft spatulated and sewn end-to-side to the artery using continuous 6-0 Prolene suture.  The graft was then pulled to the appropriate length for anastomosis of the femoral vein.  A Cooley clamp was placed across the femoral vein.  Longitudinal venotomy was made. The graft was cut to the appropriate length, spatulated, and sewn end-to- side to the femoral vein using continuous 6-0 Prolene suture.  At the completion, there was an excellent thrill in the graft.  There was a small amount of seepage through the graft and serous fluid.  I therefore used Gelfoam and thrombin to control this.   Hemostasis was obtained in the wound.  The counterincision was closed with a deep layer of 3-0 Vicryl.  The skin closed with 4-0 Vicryl.  The groin incision was closed with a deep layer of 3-0 Vicryl, the subcutaneous layer with 3-0 Vicryl, and the skin closed with 4-0 Vicryl.  Sterile dressing was applied.  The patient tolerated the procedure well.  He was transferred to recovery room in stable condition.  All needle and sponge counts were correct.     Di Kindle. Edilia Bo, M.D.    CSD/MEDQ  D:  11/22/2010  T:  11/23/2010  Job:  [id number]  Electronically Signed by Waverly Ferrari M.D. on 11/27/2010 02:10:03 PM

## 2010-12-04 DIAGNOSIS — R011 Cardiac murmur, unspecified: Secondary | ICD-10-CM

## 2010-12-04 LAB — RENAL FUNCTION PANEL
BUN: 37 mg/dL — ABNORMAL HIGH (ref 6–23)
CO2: 23 mEq/L (ref 19–32)
Calcium: 8.6 mg/dL (ref 8.4–10.5)
Glucose, Bld: 178 mg/dL — ABNORMAL HIGH (ref 70–99)
Glucose, Bld: 137 mg/dL — ABNORMAL HIGH (ref 70–99)
Phosphorus: 4.1 mg/dL (ref 2.3–4.6)
Potassium: 4.5 mEq/L (ref 3.5–5.1)
Sodium: 137 mEq/L (ref 135–145)

## 2010-12-04 LAB — GLUCOSE, CAPILLARY
Glucose-Capillary: 134 mg/dL — ABNORMAL HIGH (ref 70–99)
Glucose-Capillary: 175 mg/dL — ABNORMAL HIGH (ref 70–99)
Glucose-Capillary: 143 mg/dL — ABNORMAL HIGH (ref 70–99)
Glucose-Capillary: 125 mg/dL — ABNORMAL HIGH (ref 70–99)

## 2010-12-04 LAB — MAGNESIUM: Magnesium: 2.9 mg/dL — ABNORMAL HIGH (ref 1.5–2.5)

## 2010-12-05 LAB — GLUCOSE, CAPILLARY
Glucose-Capillary: 133 mg/dL — ABNORMAL HIGH (ref 70–99)
Glucose-Capillary: 180 mg/dL — ABNORMAL HIGH (ref 70–99)

## 2010-12-05 LAB — RENAL FUNCTION PANEL
CO2: 24 mEq/L (ref 19–32)
CO2: 20 mEq/L (ref 19–32)
Calcium: 8.1 mg/dL — ABNORMAL LOW (ref 8.4–10.5)
Creatinine, Ser: 3 mg/dL — ABNORMAL HIGH (ref 0.4–1.5)
GFR calc Af Amer: 26 mL/min — ABNORMAL LOW (ref >60)
GFR calc non Af Amer: 21 mL/min — ABNORMAL LOW (ref >60)
Glucose, Bld: 172 mg/dL — ABNORMAL HIGH (ref 70–99)
Phosphorus: 4.1 mg/dL (ref 2.3–4.6)
Potassium: 4.3 mEq/L (ref 3.5–5.1)
Sodium: 135 mEq/L (ref 135–145)

## 2010-12-05 LAB — CBC
HCT: 29 % — ABNORMAL LOW (ref 39.0–52.0)
MCH: 30 pg (ref 26.0–34.0)
MCHC: 32.1 g/dL (ref 30.0–36.0)
MCV: 93.5 fL (ref 78.0–100.0)
RDW: 17.1 % — ABNORMAL HIGH (ref 11.5–15.5)

## 2010-12-06 LAB — RENAL FUNCTION PANEL
Albumin: 2.5 g/dL — ABNORMAL LOW (ref 3.5–5.2)
Albumin: 2.3 g/dL — ABNORMAL LOW (ref 3.5–5.2)
Calcium: 8.5 mg/dL (ref 8.4–10.5)
GFR calc Af Amer: 29 mL/min — ABNORMAL LOW (ref >60)
GFR calc non Af Amer: 24 mL/min — ABNORMAL LOW (ref >60)
Phosphorus: 4 mg/dL (ref 2.3–4.6)
Phosphorus: 3.5 mg/dL (ref 2.3–4.6)
Potassium: 4.4 mEq/L (ref 3.5–5.1)
Potassium: 4 mEq/L (ref 3.5–5.1)
Sodium: 138 mEq/L (ref 135–145)
Sodium: 136 mEq/L (ref 135–145)

## 2010-12-06 LAB — GLUCOSE, CAPILLARY
Glucose-Capillary: 100 mg/dL — ABNORMAL HIGH (ref 70–99)
Glucose-Capillary: 101 mg/dL — ABNORMAL HIGH (ref 70–99)
Glucose-Capillary: 120 mg/dL — ABNORMAL HIGH (ref 70–99)
Glucose-Capillary: 132 mg/dL — ABNORMAL HIGH (ref 70–99)
Glucose-Capillary: 167 mg/dL — ABNORMAL HIGH (ref 70–99)

## 2010-12-06 LAB — CBC
MCV: 93.4 fL (ref 78.0–100.0)
Platelets: 162 10*3/uL (ref 150–400)
RBC: 3.02 MIL/uL — ABNORMAL LOW (ref 4.22–5.81)
WBC: 19.2 10*3/uL — ABNORMAL HIGH (ref 4.0–10.5)

## 2010-12-06 LAB — MAGNESIUM: Magnesium: 2.8 mg/dL — ABNORMAL HIGH (ref 1.5–2.5)

## 2010-12-06 LAB — POCT I-STAT 4, (NA,K, GLUC, HGB,HCT)
Potassium: 4.4 mEq/L (ref 3.5–5.1)
Sodium: 143 mEq/L (ref 135–145)

## 2010-12-07 LAB — CBC
Hemoglobin: 8.5 g/dL — ABNORMAL LOW (ref 13.0–17.0)
MCH: 30.5 pg (ref 26.0–34.0)
MCV: 93.9 fL (ref 78.0–100.0)
Platelets: 177 10*3/uL (ref 150–400)
RBC: 2.79 MIL/uL — ABNORMAL LOW (ref 4.22–5.81)
WBC: 18.1 10*3/uL — ABNORMAL HIGH (ref 4.0–10.5)

## 2010-12-07 LAB — RENAL FUNCTION PANEL
Albumin: 2.4 g/dL — ABNORMAL LOW (ref 3.5–5.2)
BUN: 25 mg/dL — ABNORMAL HIGH (ref 6–23)
BUN: 21 mg/dL (ref 6–23)
CO2: 22 mEq/L (ref 19–32)
Calcium: 8.4 mg/dL (ref 8.4–10.5)
Chloride: 98 mEq/L (ref 96–112)
Chloride: 102 mEq/L (ref 96–112)
Creatinine, Ser: 2.36 mg/dL — ABNORMAL HIGH (ref 0.4–1.5)
GFR calc Af Amer: 34 mL/min — ABNORMAL LOW (ref >60)
Glucose, Bld: 111 mg/dL — ABNORMAL HIGH (ref 70–99)
Phosphorus: 3.4 mg/dL (ref 2.3–4.6)
Potassium: 4 mEq/L (ref 3.5–5.1)
Sodium: 130 mEq/L — ABNORMAL LOW (ref 135–145)

## 2010-12-07 LAB — GLUCOSE, CAPILLARY
Glucose-Capillary: 103 mg/dL — ABNORMAL HIGH (ref 70–99)
Glucose-Capillary: 108 mg/dL — ABNORMAL HIGH (ref 70–99)
Glucose-Capillary: 89 mg/dL (ref 70–99)

## 2010-12-07 LAB — MAGNESIUM: Magnesium: 2.5 mg/dL (ref 1.5–2.5)

## 2010-12-08 ENCOUNTER — Inpatient Hospital Stay (HOSPITAL_COMMUNITY): Payer: Medicare Other

## 2010-12-08 LAB — CBC
MCV: 96.1 fL (ref 78.0–100.0)
Platelets: 149 10*3/uL — ABNORMAL LOW (ref 150–400)
RBC: 2.56 MIL/uL — ABNORMAL LOW (ref 4.22–5.81)
RDW: 18.1 % — ABNORMAL HIGH (ref 11.5–15.5)
WBC: 14.4 10*3/uL — ABNORMAL HIGH (ref 4.0–10.5)

## 2010-12-08 LAB — RENAL FUNCTION PANEL
Albumin: 2.5 g/dL — ABNORMAL LOW (ref 3.5–5.2)
Albumin: 2.3 g/dL — ABNORMAL LOW (ref 3.5–5.2)
Calcium: 8.9 mg/dL (ref 8.4–10.5)
Calcium: 8.5 mg/dL (ref 8.4–10.5)
Chloride: 103 mEq/L (ref 96–112)
Creatinine, Ser: 2.42 mg/dL — ABNORMAL HIGH (ref 0.4–1.5)
GFR calc Af Amer: 33 mL/min — ABNORMAL LOW (ref >60)
GFR calc Af Amer: 33 mL/min — ABNORMAL LOW (ref >60)
GFR calc non Af Amer: 27 mL/min — ABNORMAL LOW (ref >60)
Glucose, Bld: 106 mg/dL — ABNORMAL HIGH (ref 70–99)
Phosphorus: 3.9 mg/dL (ref 2.3–4.6)
Potassium: 4.5 mEq/L (ref 3.5–5.1)
Sodium: 136 mEq/L (ref 135–145)
Sodium: 138 mEq/L (ref 135–145)

## 2010-12-08 LAB — GLUCOSE, CAPILLARY
Glucose-Capillary: 89 mg/dL (ref 70–99)
Glucose-Capillary: 123 mg/dL — ABNORMAL HIGH (ref 70–99)
Glucose-Capillary: 125 mg/dL — ABNORMAL HIGH (ref 70–99)

## 2010-12-08 LAB — MAGNESIUM: Magnesium: 2.6 mg/dL — ABNORMAL HIGH (ref 1.5–2.5)

## 2010-12-08 LAB — HEMOCCULT GUIAC POC 1CARD (OFFICE): Fecal Occult Bld: NEGATIVE

## 2010-12-08 LAB — PROCALCITONIN: Procalcitonin: 2.21 ng/mL

## 2010-12-09 ENCOUNTER — Inpatient Hospital Stay (HOSPITAL_COMMUNITY): Payer: Medicare Other

## 2010-12-09 DIAGNOSIS — A419 Sepsis, unspecified organism: Secondary | ICD-10-CM

## 2010-12-09 LAB — GLUCOSE, CAPILLARY
Glucose-Capillary: 116 mg/dL — ABNORMAL HIGH (ref 70–99)
Glucose-Capillary: 123 mg/dL — ABNORMAL HIGH (ref 70–99)

## 2010-12-09 LAB — CBC
MCH: 30.7 pg (ref 26.0–34.0)
MCV: 98.3 fL (ref 78.0–100.0)
Platelets: 139 10*3/uL — ABNORMAL LOW (ref 150–400)
RDW: 18.5 % — ABNORMAL HIGH (ref 11.5–15.5)

## 2010-12-09 LAB — RENAL FUNCTION PANEL
BUN: 18 mg/dL (ref 6–23)
BUN: 17 mg/dL (ref 6–23)
CO2: 22 mEq/L (ref 19–32)
Calcium: 8.8 mg/dL (ref 8.4–10.5)
Chloride: 104 mEq/L (ref 96–112)
Creatinine, Ser: 2.27 mg/dL — ABNORMAL HIGH (ref 0.4–1.5)
Creatinine, Ser: 2.04 mg/dL — ABNORMAL HIGH (ref 0.4–1.5)
Glucose, Bld: 122 mg/dL — ABNORMAL HIGH (ref 70–99)
Glucose, Bld: 136 mg/dL — ABNORMAL HIGH (ref 70–99)
Phosphorus: 3.5 mg/dL (ref 2.3–4.6)

## 2010-12-10 ENCOUNTER — Inpatient Hospital Stay (HOSPITAL_COMMUNITY): Payer: Medicare Other

## 2010-12-10 LAB — CBC
HCT: 23.4 % — ABNORMAL LOW (ref 39.0–52.0)
Hemoglobin: 7.3 g/dL — ABNORMAL LOW (ref 13.0–17.0)
MCV: 98.7 fL (ref 78.0–100.0)
RBC: 2.37 MIL/uL — ABNORMAL LOW (ref 4.22–5.81)
WBC: 5.3 10*3/uL (ref 4.0–10.5)

## 2010-12-10 LAB — BASIC METABOLIC PANEL
BUN: 16 mg/dL (ref 6–23)
Chloride: 101 mEq/L (ref 96–112)
Glucose, Bld: 138 mg/dL — ABNORMAL HIGH (ref 70–99)
Potassium: 4.7 mEq/L (ref 3.5–5.1)
Sodium: 140 mEq/L (ref 135–145)

## 2010-12-10 LAB — TSH: TSH: 3.457 u[IU]/mL (ref 0.350–4.500)

## 2010-12-10 LAB — RENAL FUNCTION PANEL
Calcium: 9.3 mg/dL (ref 8.4–10.5)
Creatinine, Ser: 1.9 mg/dL — ABNORMAL HIGH (ref 0.4–1.5)
GFR calc Af Amer: 44 mL/min — ABNORMAL LOW (ref >60)
GFR calc non Af Amer: 36 mL/min — ABNORMAL LOW (ref >60)
Glucose, Bld: 144 mg/dL — ABNORMAL HIGH (ref 70–99)
Phosphorus: 3.6 mg/dL (ref 2.3–4.6)
Sodium: 138 mEq/L (ref 135–145)

## 2010-12-10 LAB — CORTISOL: Cortisol, Plasma: 66.9 ug/dL

## 2010-12-10 LAB — GLUCOSE, CAPILLARY: Glucose-Capillary: 132 mg/dL — ABNORMAL HIGH (ref 70–99)

## 2010-12-11 LAB — BASIC METABOLIC PANEL
BUN: 20 mg/dL (ref 6–23)
CO2: 24 mEq/L (ref 19–32)
Calcium: 9.2 mg/dL (ref 8.4–10.5)
Chloride: 102 mEq/L (ref 96–112)
Creatinine, Ser: 1.96 mg/dL — ABNORMAL HIGH (ref 0.4–1.5)
GFR calc Af Amer: 42 mL/min — ABNORMAL LOW (ref >60)

## 2010-12-11 LAB — GLUCOSE, CAPILLARY
Glucose-Capillary: 158 mg/dL — ABNORMAL HIGH (ref 70–99)
Glucose-Capillary: 162 mg/dL — ABNORMAL HIGH (ref 70–99)
Glucose-Capillary: 180 mg/dL — ABNORMAL HIGH (ref 70–99)
Glucose-Capillary: 178 mg/dL — ABNORMAL HIGH (ref 70–99)

## 2010-12-11 LAB — RENAL FUNCTION PANEL
CO2: 25 mEq/L (ref 19–32)
Chloride: 102 mEq/L (ref 96–112)
GFR calc Af Amer: 52 mL/min — ABNORMAL LOW (ref >60)
GFR calc non Af Amer: 43 mL/min — ABNORMAL LOW (ref >60)
Glucose, Bld: 179 mg/dL — ABNORMAL HIGH (ref 70–99)
Potassium: 4.5 mEq/L (ref 3.5–5.1)
Sodium: 136 mEq/L (ref 135–145)

## 2010-12-11 LAB — CBC
Hemoglobin: 7 g/dL — ABNORMAL LOW (ref 13.0–17.0)
MCH: 31.4 pg (ref 26.0–34.0)
MCHC: 31.8 g/dL (ref 30.0–36.0)
MCV: 98.7 fL (ref 78.0–100.0)
RBC: 2.23 MIL/uL — ABNORMAL LOW (ref 4.22–5.81)

## 2010-12-11 LAB — IRON AND TIBC
Iron: 127 ug/dL (ref 42–135)
TIBC: 207 ug/dL — ABNORMAL LOW (ref 215–435)

## 2010-12-11 LAB — PROCALCITONIN: Procalcitonin: 0.73 ng/mL

## 2010-12-12 LAB — MAGNESIUM: Magnesium: 2.9 mg/dL — ABNORMAL HIGH (ref 1.5–2.5)

## 2010-12-12 LAB — CBC
HCT: 32.1 % — ABNORMAL LOW (ref 39.0–52.0)
MCH: 31 pg (ref 26.0–34.0)
MCV: 95.5 fL (ref 78.0–100.0)
Platelets: 101 10*3/uL — ABNORMAL LOW (ref 150–400)
RBC: 3.36 MIL/uL — ABNORMAL LOW (ref 4.22–5.81)
RDW: 18.6 % — ABNORMAL HIGH (ref 11.5–15.5)
WBC: 11.7 10*3/uL — ABNORMAL HIGH (ref 4.0–10.5)

## 2010-12-12 LAB — RENAL FUNCTION PANEL
Albumin: 2.7 g/dL — ABNORMAL LOW (ref 3.5–5.2)
BUN: 21 mg/dL (ref 6–23)
Calcium: 9.1 mg/dL (ref 8.4–10.5)
Creatinine, Ser: 1.83 mg/dL — ABNORMAL HIGH (ref 0.4–1.5)
GFR calc Af Amer: 46 mL/min — ABNORMAL LOW (ref >60)
GFR calc non Af Amer: 38 mL/min — ABNORMAL LOW (ref >60)

## 2010-12-12 LAB — GLUCOSE, CAPILLARY: Glucose-Capillary: 172 mg/dL — ABNORMAL HIGH (ref 70–99)

## 2010-12-12 LAB — CROSSMATCH: Unit division: 0

## 2010-12-13 ENCOUNTER — Inpatient Hospital Stay (HOSPITAL_COMMUNITY): Payer: Medicare Other

## 2010-12-13 LAB — CBC
MCH: 31.4 pg (ref 26.0–34.0)
MCHC: 32.4 g/dL (ref 30.0–36.0)
Platelets: 97 10*3/uL — ABNORMAL LOW (ref 150–400)
RDW: 19 % — ABNORMAL HIGH (ref 11.5–15.5)

## 2010-12-13 LAB — GLUCOSE, CAPILLARY
Glucose-Capillary: 187 mg/dL — ABNORMAL HIGH (ref 70–99)
Glucose-Capillary: 131 mg/dL — ABNORMAL HIGH (ref 70–99)
Glucose-Capillary: 139 mg/dL — ABNORMAL HIGH (ref 70–99)
Glucose-Capillary: 186 mg/dL — ABNORMAL HIGH (ref 70–99)

## 2010-12-13 LAB — RENAL FUNCTION PANEL
Albumin: 2.6 g/dL — ABNORMAL LOW (ref 3.5–5.2)
CO2: 25 mEq/L (ref 19–32)
Calcium: 9.5 mg/dL (ref 8.4–10.5)
Creatinine, Ser: 3.32 mg/dL — ABNORMAL HIGH (ref 0.4–1.5)
GFR calc Af Amer: 23 mL/min — ABNORMAL LOW (ref >60)
GFR calc non Af Amer: 19 mL/min — ABNORMAL LOW (ref >60)
Phosphorus: 1.9 mg/dL — ABNORMAL LOW (ref 2.3–4.6)
Sodium: 139 mEq/L (ref 135–145)

## 2010-12-13 LAB — MAGNESIUM: Magnesium: 2.9 mg/dL — ABNORMAL HIGH (ref 1.5–2.5)

## 2010-12-13 LAB — MRSA PCR SCREENING: MRSA by PCR: NEGATIVE

## 2010-12-14 ENCOUNTER — Inpatient Hospital Stay (HOSPITAL_COMMUNITY): Payer: Medicare Other

## 2010-12-14 LAB — CBC
HCT: 32.2 % — ABNORMAL LOW (ref 39.0–52.0)
Hemoglobin: 10.3 g/dL — ABNORMAL LOW (ref 13.0–17.0)
MCH: 31.8 pg (ref 26.0–34.0)
MCHC: 32 g/dL (ref 30.0–36.0)
MCV: 99.4 fL (ref 78.0–100.0)
Platelets: 98 K/uL — ABNORMAL LOW (ref 150–400)
RBC: 3.24 MIL/uL — ABNORMAL LOW (ref 4.22–5.81)
RDW: 20 % — ABNORMAL HIGH (ref 11.5–15.5)
WBC: 11.4 K/uL — ABNORMAL HIGH (ref 4.0–10.5)

## 2010-12-14 LAB — CULTURE, BLOOD (ROUTINE X 2)
Culture  Setup Time: 201202191954
Culture: NO GROWTH

## 2010-12-14 LAB — RENAL FUNCTION PANEL
Albumin: 2.4 g/dL — ABNORMAL LOW (ref 3.5–5.2)
Chloride: 103 mEq/L (ref 96–112)
Creatinine, Ser: 5.29 mg/dL — ABNORMAL HIGH (ref 0.4–1.5)
GFR calc Af Amer: 13 mL/min — ABNORMAL LOW (ref >60)
GFR calc non Af Amer: 11 mL/min — ABNORMAL LOW (ref >60)
Potassium: 3.4 mEq/L — ABNORMAL LOW (ref 3.5–5.1)

## 2010-12-14 LAB — MAGNESIUM: Magnesium: 3.3 mg/dL — ABNORMAL HIGH (ref 1.5–2.5)

## 2010-12-15 LAB — MAGNESIUM: Magnesium: 2.7 mg/dL — ABNORMAL HIGH (ref 1.5–2.5)

## 2010-12-16 ENCOUNTER — Inpatient Hospital Stay (HOSPITAL_COMMUNITY): Payer: Medicare Other

## 2010-12-16 LAB — RENAL FUNCTION PANEL
BUN: 74 mg/dL — ABNORMAL HIGH (ref 6–23)
Calcium: 9.5 mg/dL (ref 8.4–10.5)
Creatinine, Ser: 6.67 mg/dL — ABNORMAL HIGH (ref 0.4–1.5)
Glucose, Bld: 207 mg/dL — ABNORMAL HIGH (ref 70–99)
Phosphorus: 3.3 mg/dL (ref 2.3–4.6)
Sodium: 139 mEq/L (ref 135–145)

## 2010-12-16 LAB — MAGNESIUM: Magnesium: 2.8 mg/dL — ABNORMAL HIGH (ref 1.5–2.5)

## 2010-12-16 LAB — CBC
HCT: 32.3 % — ABNORMAL LOW (ref 39.0–52.0)
MCH: 31.9 pg (ref 26.0–34.0)
MCHC: 31.9 g/dL (ref 30.0–36.0)
RDW: 21 % — ABNORMAL HIGH (ref 11.5–15.5)

## 2010-12-17 NOTE — Progress Notes (Addendum)
[name], [name]                 ACCOUNT NO.:  [id number]  MEDICAL RECORD NO.:  [id number]           PATIENT TYPE:  I  LOCATION:  2605                         FACILITY:  [facility]  PHYSICIAN:  [name], M.D.DATE OF BIRTH:  27-Sep-1951                                PROGRESS NOTE   REQUESTING PHYSICIAN: [name] Kindle. Edilia Bo, MD  PRIMARY CARE PHYSICIAN: Unknown.  REASON FOR CONSULTATION: Assistance with medical issues.  CHIEF COMPLAINT: The patient is mute.  HISTORY OF PRESENT ILLNESS: This is a 60 year old man who was admitted by Dr. Edilia [name], on November 22, 2010, for thigh graft placement for end-stage renal disease and hemodialysis.  Postoperatively, he developed shocked and was seen in consultation by Pulmonary Critical Care Medicine.  He is currently on hospital day #25, and he has had a prolonged and complicated course summarized briefly as follows.  He remained in the ICU on November 23, 2010.  He developed an upper GI bleed with aspiration and was intubated. On November 24, 2010, he had an  EGD, which showed an evidence of upper GI bleed.  He was seen on November 25, 2009, by the palliative medicine team and in consultation with family was made limited code.  On December 04, 2009, he was noted to be vasopressor dependent until at least December 11, 2010.  On December 12, 2010, n.p.o. status was recommended by Speech Therapy and Panda tube was placed.  On December 14, 2010, the Pulmonary Critical Care Medicine signed off the patient's care.  His course is further outlined as follows.  Following his admission to the ICU and consultation with critical care, the patient developed an aspiration pneumonia after his aspiration and actually had a cardiac arrest, which was witnessed.  He was successfully resuscitated.  He was treated with long course of antibiotics and his course was complicated by prolonged vasopressor dependence for unclear reasons.  These  were eventually weaned and he continues on hydrocortisone wean as recommended by Pulmonary Critical Care Medicine.  He also continues on midodrine at this point.  No history and physical was immediately available.  Information has been gained from the chart as there is no family present and the patient is mute.  PAST MEDICAL HISTORY: 1. Mental retardation. 2. Mutism. 3. End-stage renal disease. 4. Hypertension. 5. Hyperlipidemia. 6. Esophageal varices. 7. Myoclonic jerks. 8. Right hand steel secondary to AV graft removal in 2011. 9. Anxiety. 10.Hyperparathyroidism.  PAST SURGICAL HISTORY: Thigh AV graft placement.  SOCIAL HISTORY: He lives with family.  ALLERGIES: By review of the chart, he has no known allergies.  FAMILY HISTORY: Unknown at this point.  PHYSICAL EXAMINATION: VITAL SIGNS:  Temperature is 98.3, afebrile, heart rate 110, blood pressure 100/47, respiratory rate 19, and oxygen saturation 91% on room air. GENERAL:  This is a chronically ill-appearing man, who appears to be in no acute distress.  I have discussed the case with the patient's nurse and by report the patient is at his baseline.  He is mute, does not clearly follow commands, and does not fully participate with examination. HEENT:  Head appears to be grossly normal.  Eyes, sclerae are clear. Pupils, irides and lids appear normal.  ENT, lips and tongue appear normal.  Hearing appears to be grossly normal. NECK:  Supple.  No lymphadenopathy or masses.  No thyromegaly. CHEST:  Clear to auscultation bilaterally with no wheezes, rales, or rhonchi.  There is normal respiratory effort.  CARDIOVASCULAR: Tachycardic.  Regular rhythm.  No murmur, rub, or gallop.  He does have bilateral upper and lower extremity edema.  The chronicity of this is unclear. SKIN:  Appears to be unremarkable without cyanosis, clubbing, or edema. He does have an ulcer on his lower lip.  Panda tube is in place. ABDOMEN:   Soft, nontender, and nondistended, and is obese. GENITOURINARY:  Grossly appears unremarkable.  CURRENT MEDICATIONS: 1. Aranesp weekly. 2. Hydrocortisone 15 mg in the morning and 25 mg in the evening. 3. Iron 15 mg IV every 7 days. 4. Midodrine 15 mg per tube q.8 h. 5. Tube feeds per Panda. 6. Protonix 40 mg per tube daily. 7. Zemplar 4 mcg IV daily for IV rather with hemodialysis.  LABORATORY DATA: Review of microbiology data rather, blood cultures x2, December 08, 2010, no growth.  Blood culture November 22, 2010, no growth.  ANCILLARY STUDIES: One 2-D echocardiogram on December 04, 2010.  Left ventricular ejection fraction 60% to 65%.  No regional wall motion abnormalities.  PERTINENT LABORATORY DATA: 1. TSH within normal limits. 2. Random serum cortisol was more than adequate.  IMPRESSION AND RECOMMENDATIONS: This is a 60 year old man admitted on November 22, 2010, for thigh graft placement with prolonged hospital course complicated by ventilatory dependent respiratory failure, shock of unclear etiology, and prolonged vasopressor support.  Cardiac arrest, GI bleed, and aspiration pneumonia. 1. Status post thigh graft.  Management per Dr. Edilia [name]. 2. Status post postoperative shock with ventilatory dependent     respiratory failure, resolved.  Prolonged vasopressor support,     resolved.  Cardiac arrest, successfully resuscitated, GI bleed     upper, end-stage renal disease on continued hemodialysis,     aspiration pneumonia, treated, stable thrombocytopenia, acute blood     loss anemia.  The patient seems to have recovered from these acute     issues. 3. Dysphagia.  Currently, the patient has Panda tube in place.  His     family is considering taking him home without the Panda tube.  I     would certainly clarify with them that he is at risk for aspiration     and sudden death as evidenced by his cardiac arrest here in the     hospital.  If the family is not willing to  accept this risk, a PEG     tube could be considered, would defer to the family's conversation     with Palliative Medicine team and agree with their consultation. 4. End-stage renal disease as per Nephrology. 5. Hypertension.  This appears to be stable.  Of course, he is at this     point on no antihypertensives given his shock and prolonged     hypotension.  He continues on hydrocortisone.  Please refer to     Pulmonary Critical Care Medicine note from December 14, 2009, for     directions on wean. 6. History of mental retardation and muteness.  Per discussion with     nursing staff, the patient appears to be at his baseline. 7. History of right foot drop.  He continues with the boot.  Medical issues do appear to be stable at this point.  We would continue his hydrocortisone dosing as per Pulmonary Critical Care Medicine consultation note on December 14, 2010.  We will continue on midodrine as well again for hypotension.  Continue on Protonix therapy for his history of GI bleed.  Again, all his medical issues appeared to be stable at this point.  [facility] hospitalist will continue to follow from a medical perspective in consultation.  Thank you for this consultation.  I did discuss the case with Dr. Edilia [name] today in regard to consultation.     Brendia Sacks, MD   ______________________________ [name], M.D.    DG/MEDQ  D:  12/17/2010  T:  12/17/2010  Job:  [id number]  Electronically Signed by Waverly Ferrari M.D. on 12/17/2010 05:52:22 PM Electronically Signed by Brendia Sacks  on 12/25/2010 09:23:12 PM

## 2010-12-18 LAB — MAGNESIUM: Magnesium: 2.5 mg/dL (ref 1.5–2.5)

## 2010-12-19 ENCOUNTER — Telehealth: Payer: Self-pay | Admitting: *Deleted

## 2010-12-19 NOTE — Telephone Encounter (Signed)
Pt's case manager, bunmi, from [facility] calls to states pt was just released from hospital after a 25 day stay and needs a hospital bed for safety issues. We may reach her at [phone]. She states while he was inpt the [facility] md's took care of him. Pt has not been seen in clinic since may of 2011. Possibility  kidney has assumed care? Pt did have a scheduled appt with dr Gilford Rile in feb but was inpt at that time

## 2010-12-19 NOTE — Progress Notes (Signed)
  [name], [name]                 ACCOUNT NO.:  [id number]  MEDICAL RECORD NO.:  [id number]           PATIENT TYPE:  I  LOCATION:  6714                         FACILITY:  [facility]  PHYSICIAN:  Di Kindle. Edilia Bo, M.D.DATE OF BIRTH:  [birth year]                                PROGRESS NOTE   COLLABORATING PHYSICIAN: Melissa L. Ladona Ridgel, MD  HOSPITALIST: Brendia Sacks, MD  This nurse practitioner Dorian Pod followed up with this patient and the patient's family.  Previous establish goals of care was done on this patient by Dr. Anderson Malta on November 25, 2010 and has been reviewed.  A limited physical exam was done today.  Conversation was had with Dr. Irene Limbo and the nursing staff here on the unit and then I spoke with the patient's sister and power of attorney [name] at her work number [phone] to clarify and follow up with family concerning the patient's status.  The patient says has been here for an extended hospitalization.  He currently has a panda tube placed for insufficient caloric intake.  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  He is a hemodialysis patient status post arrest this admission.  Per nursing staff and conversation with [name], the patient has returned to his baseline.  Per documentation in this chart, the patient has tolerated hemodialysis without significant complications.  Vital signs are stable. Per [name], the patient's health care power of attorney and sister, she is adamant that the patient be discharged home as soon as possible and medically stable.  She is asking that the panda tube be discontinued and the patient be allowed to eat as tolerated.  I have educated her on the increased risk of aspiration and aspiration pneumonia which might ultimately lead to respiratory of distress and respiratory arrest.  She is aware of this risk, but states that the patient has been in the hospital too long and that he needs to return home  where she feels that he will regain strength and his appetite.  The patient has several community resources.  He is a [facility] patient and has had the same care provider for 25 years according to family.  So we will go ahead and write for panda tube to be discontinued, diet as tolerated.  I confirmed with Ms. [name] the patient is a do not resuscitate in regards to CPR but she is still asking for intubation and life support if needed.  This information has been relayed to Dr. Irene Limbo who is here seeing the patient and the nursing staff.  We will continue to follow along if I can be of further assistance, please page me at [phone].     Dorian Pod, ACNP   ______________________________ Di Kindle. Edilia Bo, M.D.    MB/MEDQ  D:  12/17/2010  T:  12/18/2010  Job:  [id number]  Electronically Signed by Dorian Pod ACNP on 12/18/2010 01:25:35 PM Electronically Signed by Waverly Ferrari M.D. on 12/19/2010 03:03:15 PM

## 2010-12-19 NOTE — Discharge Summary (Signed)
  NAMEDAQWAN, DOUGAL                 ACCOUNT NO.:  [id number]  MEDICAL RECORD NO.:  [id number]           PATIENT TYPE:  LOCATION:                                 FACILITY:  PHYSICIAN:  Di Kindle. Edilia Bo, M.D.DATE OF BIRTH:  [birth year]  DATE OF ADMISSION: DATE OF DISCHARGE:                              DISCHARGE SUMMARY   ADDENDUM  DISCHARGE DIAGNOSIS:  Chronic kidney disease admitted for right thigh arteriovenous graft with prolonged postoperative course.  SECONDARY DIAGNOSES: 1. Gastrointestinal bleed, source unknown. 2. Aspiration. 3. End-stage renal disease. 4. Hypertension. 5. History of mental retardation. 6. History of hyperlipidemia.  MEDICATIONS ON DISCHARGE: 1. Albuterol 2.5 mg 3 mL nebulizer q.3 hours p.r.n. 2. Hydrocortisone 12.5 mg p.o. bedtime. 3. Hydrocortisone 25 mg p.o. q.a.m. 4. Midodrine tablets 15 mg p.o. q.8 hours. 5. Nepro vanilla 237 mL t.i.d. p.r.n. 6. Oxycodone 5 mg IR 1-2 q.4 hours p.r.n. pain. 7. Protonix 40 mg p.o. daily. 8. Sorbitol 70% 30 mL p.o. p.r.n. 9. Calcium carbonate 500 mg one p.o. daily. 10.Clonazepam 0.5 mg half a tablet p.o. b.i.d. 11.Lipitor 20 mg p.o. bedtime. 12.Renal vitamin one p.o. daily.     Di Kindle. Edilia Bo, M.D.     CSD/MEDQ  D:  12/17/2010  T:  12/18/2010  Job:  [id number]  Electronically Signed by Waverly Ferrari M.D. on 12/19/2010 03:02:44 PM

## 2010-12-19 NOTE — Discharge Summary (Signed)
[name], [name]                 ACCOUNT NO.:  [id number]  MEDICAL RECORD NO.:  [id number]           PATIENT TYPE:  I  LOCATION:  6714                         FACILITY:  [facility]  PHYSICIAN:  Di Kindle. Edilia Bo, M.D.DATE OF BIRTH:  27-Nov-1950  DATE OF ADMISSION:  11/22/2010 DATE OF DISCHARGE:  12/18/2010                              DISCHARGE SUMMARY   DATE OF PLANNED DISCHARGE:  December 18, 2010.  REASON FOR ADMISSION:  Hypotension status post placement of right thigh AV graft.  HISTORY:  This is a pleasant 60 year old gentleman with a history of mental retardation who has a Diatek in his left groin and dialyzes Tuesdays, Thursdays, and Saturdays.  I was asked to evaluate him for placement of a thigh graft.  His initial evaluation was on June 26, 2010.  Preoperative evaluation showed he had a palpable right femoral pulse and Doppler study showed triphasic Doppler signals in both feet with ABIs of 100% on the right, 97% on the left.  I did not think he was a candidate for right thigh graft.  The family was not agreeable to this initially.  The patient does not like needles and the family has therefore held off on placement of a graft and continued to use the catheter.  He returned on January 25; and at that time, the family was agreeable to placement of a right thigh AV graft.  He was admitted on November 22, 2010, for placement of a right thigh AV graft.  HOSPITAL COURSE:  The patient underwent uneventful placement of a new right thigh AV graft on November 22, 2010.  The plan was to keep him overnight with plans for discharge after dialysis the following day. However, the patient developed problems with hypotension and therefore was admitted.  He required hands.  He required significant pressor support to maintain his blood pressure, and Critical Care Medicine was consulted and managed this.  He also developed an upper GI bleed; and at that point, the decision was made  not to make an aggressive approach for further workup of this and he had no further bleeding during this hospitalization.  I did review his EGD which showed large amount of old blood in the stomach.  There was also blood in the duodenal bulb, but no source was identified.  As he had no further bleeding episodes, a repeat endoscopy was not performed and GI signed off.  Of note also during hospitalization, it was noted that he possibly had some aspiration and therefore he did require intubation at one point, but was successfully extubated.  A panda tube was placed and he was receiving tube feeds.  Of note, throughout his hospital course despite problems with persistent hypotension, his thigh graft remained stable. Ultimately, Critical Care Medicine signed off the case.  Palliative Medicine Team was consulted and they have worked with the family closely.  The patient was ultimately transferred to 6700 on December 17, 2010.  Palliative Care Medicine evaluated the patient on December 17, 2010, and after discussion with the family, the family requested that the patient be discharged home ASAP.  They understand that he  is still having some occasional problems with blood pressure; however, they believe that this is his baseline.  The family has been educated by the Palliative Medicine Team that the patient is at risk for recurrent aspiration, but the family is willing to take this chance.  They want the panda tube feeds discontinued and diet resumed.  Given the family's request, I think this is perfectly reasonable plan to discharge the patient after dialysis on December 18, 2010.  We will discontinue his panda and ask for Speech Pathology to make recommendations concerning his diet.  Pertinent laboratory evaluation shows on February 27 a potassium of 3.1. On February 27, white count was 10.9, H and H 10 and 32, platelets 125,000.  DISPOSITION:  To home with family.  FOLLOWUP:  In 3 weeks  in my office.  CONDITION ON DISCHARGE:  Good.  Discharge Summary continued on separate dictation.   Di Kindle. Edilia Bo, M.D.     CSD/MEDQ  D:  12/17/2010  T:  12/18/2010  Job:  [id number]  cc:   [facility]  Electronically Signed by Waverly Ferrari M.D. on 12/19/2010 03:02:34 PM

## 2010-12-23 NOTE — Consult Note (Signed)
NAMEROHN, FRITSCH                 ACCOUNT NO.:  [id number]  MEDICAL RECORD NO.:  [id number]           PATIENT TYPE:  I  LOCATION:  2311                         FACILITY:  [facility]  PHYSICIAN:  Edsel Petrin, D.O.DATE OF BIRTH:  July 26, 1951  DATE OF CONSULTATION:  11/25/2010 DATE OF DISCHARGE:                                CONSULTATION   REQUESTING PHYSICIAN:  Oretha Milch, MD  REASON FOR CONSULTATION:  Goals of care, symptom management, care coordination.  HISTORY OF PRESENT ILLNESS:  [name] is a 60 year old gentleman who was admitted to [facility] on November 22, 2010 from placement of right thigh AV graft.  He has a past medical history of cognitive delay and mental retardation and end-stage renal disease and has been on dialysis since 2005.  He has recently had significant graft issues and has a history of subclavian steal syndrome making his axis even more challenging.  He has now been admitted to the Intensive Care Unit after postoperatively he developed hypotension, presumed to be secondary to some blood loss with his graft placement and possibly medications for anesthesia.  Later in the evening on November 23, 2010, he began have large-volume coffee-ground emesis and was taken to the endoscopy suite emergently and was found to have significant upper GI bleed.  Unfortunately, [name] dropped his blood pressure during the procedure and the team lost his pulse so they called the code blue.  He got CPR and also got epinephrine IV push.  He resumed spontaneous circulation and responded well to fluid resuscitation.  He was intubated and taken to 2300 ICU.  The Palliative Medicine team was asked to meet with the family to discuss goals of care in the setting of end-stage renal disease with problematic access and pressor dependence.  I met with his three sisters, one cousin, and his caregiver of 19 years.  His sister [name] is the formal power attorney  and responsible for the decision-making. She states that there is a copy of this power of attorney on file with the hospital.  At present, the family is currently struggling with the details of his admission.  They fell they have fully understood or been given the medical facts.  I discussed the entire course of events with them and referred to the medical chart for specific information that they requested.  There has been a dramatic improvement in [name] mental status than Saturday when he was critically ill, unresponsive, and having an active major upper GI bleed.  According to the family, they were told that [name] was in a coma and would not recover.  Today the family comes in, he is awake, alert, communicating with them around his endotracheal tube and with obviously nonverbal communication.  He is apparently mute at baseline so the family says that he can communicate verbally very well.  They say that he is still fighting and understand that he is in the hospital.  He does not desire to be in the hospital and the family does want to keep his quality of life at the center of our conversation today.  There has been however a disconnect between the information that they were receiving from the medical team about the severity of his condition and his current situation today.  I reassured them that there was uncertainty always when predicting prognosis and in the future course of medical conditions, this uncertainty is present for both family members and medical providers.  Provided reassurance that the medical team was providing the best possible care and aggressive care to improve his situation.  As part of our goals of care discussion, I reviewed code status, planning for access problems and future hemodialysis, continue pressor support and discussed the limitations on the future interventions if [name] did not show improvement.  The family does agree to this and would  like to take one day at a time and consider their care options.  At present, they wanted to continue with full scope medical care and aggressive interventions.  SUMMARY OF GOALS OF CARE: 1. Code status.  Initially his sister wanted to reverse her prior     decision made with CCM over the weekend, but after we discussed the     nature of DNR, they agreed not to change his status as long as the     medical team continue to provide aggressive care.  He is currently     a limited code, meds only and to continue all interventions. 2. Continue to more full scope medical care and aggressive     interventions at this point.  They desire frequent regoals and if     he declines and does not show improvement or hemodialysis access     cannot be obtained, they are very agreeable to hospice and     palliative care.  They all agree that his quality of life should     come first, but they are not ready at this point to move forward     with comfort.  They do not intend on long term ventilation or     feeding tubes, but still has hope and a strong religious faith that     he will be able to overcome this current illness.  The family is     requesting that the medical team continue with planned     interventions such as hemodialysis access attempts, repeat     endoscopy, and weaning of the vent, continue pressor support, and     starting CVVH and hemodialysis if possible.  They do understand how     serious this condition is and that [name] may not survive this     hospitalization. 3. Requesting impossible to provide pain and anxiety control, allow     the patient to bring in CD player with music when acceptable with     nursing and to liberalize visitation given his mental retardation     and cognitive problems. 4. The Palliative Medicine team will continue to follow with you on     this case and conductive regoals of care meeting based on how Mr.     [name] is doing.  I reassured the family if  his condition     deteriorated that we would assist them and help them with any     difficult decision making.  Please call the Palliative Care Team with any questions or need, number is [phone].  Thank you for this very appropriate palliative care consult request.     Edsel Petrin, D.O.     ELG/MEDQ  D:  11/25/2010  T:  11/25/2010  Job:  [id number]  Electronically Signed by Anderson Malta D.O. on 12/23/2010 05:33:21 PM

## 2010-12-24 NOTE — Telephone Encounter (Signed)
He is on dialysis and has no app't with [place].  The hospital discharge does not mention [place].  Please check with renal practice to see what our role is.

## 2010-12-31 LAB — POCT I-STAT 4, (NA,K, GLUC, HGB,HCT)
Glucose, Bld: 87 mg/dL (ref 70–99)
HCT: 35 % — ABNORMAL LOW (ref 39.0–52.0)
Hemoglobin: 11.9 g/dL — ABNORMAL LOW (ref 13.0–17.0)
Potassium: 4.6 mEq/L (ref 3.5–5.1)

## 2011-01-02 LAB — SURGICAL PCR SCREEN: Staphylococcus aureus: NEGATIVE

## 2011-01-06 ENCOUNTER — Encounter: Payer: Self-pay | Admitting: Internal Medicine

## 2011-01-07 NOTE — Op Note (Signed)
NAMEANTONY, [name]                 ACCOUNT NO.:  [id number]  MEDICAL RECORD NO.:  [id number]           PATIENT TYPE:  I  LOCATION:  2311                         FACILITY:  [facility]  PHYSICIAN:  Shirley Friar, MDDATE OF BIRTH:  April 01, 1951  DATE OF PROCEDURE: DATE OF DISCHARGE:                              OPERATIVE REPORT   INDICATIONS:  GI bleed.  MEDICATION:  Versed 1 mg IV.  INDICATIONS:  This is a 60 year old black male with renal failure who had acute onset of coffee-ground emesis this morning and has a history of gastric ulcer bleed in 2009.  He also has a history of esophageal varices that was seen in 2009, they were not bleeding on a procedure done by Dr. Matthias Hughs.  Prior to procedure, the patient was intubated and on multiple vasopressors.  No history of any melena or hematochezia or bright red blood with vomiting.  FINDINGS:  During attempts to intubate the esophagus, the patient was clenched down and clamping down on the endoscope and the oropharynx could not be intubated initially.  1 mg of Versed was given which allowed intubation of the esophagus.  Upon insertion into the esophagus, there was scattered areas of black colored fluid throughout the mid and distal esophagus.  There was small nonbleeding esophageal varices noted.  No bleeding stigmata was seen during insertion.  The endoscope was advanced into the stomach, and there was a large amount of motor-oil appearing fluid in the stomach coating the parts of the gastric mucosa.  There was a large amount of black colored fluid in the dependent portion of the stomach.  Retroflexion was done, but the cardia could not be visualized, because of large amount of black colored fluid.  The endoscope was straightened, advanced into the distal stomach where a moderate amount of black colored fluid was seen.  The distal stomach was edematous and the endoscope was advanced into the duodenal bulb.  In the duodenal  bulb, there was maroon colored blood noted as well as some black-colored fluid seen.  There was a maroon-colored medium-sized clot in the pyloric channel and duodenal bulb.  Endoscope was advanced to the second portion of duodenum, which revealed a small amount of black-colored fluid.  Upon withdrawal back into the duodenal bulb and distal stomach during irrigation, the source of the bleeding could not be identified prior to the patient's hypotension getting worse and O2 sats worsening.  The patient's blood pressure then was no longer able to be measured and the patient was pulseless. A code blue was called and the procedure was terminated.  ASSESSMENT:  Gastrointestinal bleed with clots and motor-oil appearing fluid in stomach.  Suspect source was a gastric or duodenal ulcer, but the ulcer could not be identified prior to the patient's clinical status deteriorating and a code Blue being called.  No bleeding stigmata was seen on limited view of the esophageal varices, but I suspect the bleeding source is in the stomach or duodenum, not the esophagus based on this limited view.  PLAN: 1. Resuscitation per critical care team. 2. Protonix drip.     Shirley Friar,  MD     VCS/MEDQ  D:  11/24/2010  T:  11/25/2010  Job:  [id number]  Electronically Signed by Charlott Rakes MD on 01/07/2011 03:03:58 PM

## 2011-01-10 LAB — CBC
HCT: 24.3 % — ABNORMAL LOW (ref 39.0–52.0)
MCHC: 32.3 g/dL (ref 30.0–36.0)
MCHC: 32.7 g/dL (ref 30.0–36.0)
MCHC: 32.9 g/dL (ref 30.0–36.0)
MCV: 93.9 fL (ref 78.0–100.0)
Platelets: 154 10*3/uL (ref 150–400)
Platelets: 156 10*3/uL (ref 150–400)
Platelets: 165 10*3/uL (ref 150–400)
RBC: 2.87 MIL/uL — ABNORMAL LOW (ref 4.22–5.81)
RDW: 20.2 % — ABNORMAL HIGH (ref 11.5–15.5)
RDW: 20.5 % — ABNORMAL HIGH (ref 11.5–15.5)
WBC: 12 10*3/uL — ABNORMAL HIGH (ref 4.0–10.5)

## 2011-01-10 LAB — POCT I-STAT 4, (NA,K, GLUC, HGB,HCT)
Glucose, Bld: 87 mg/dL (ref 70–99)
HCT: 36 % — ABNORMAL LOW (ref 39.0–52.0)
Hemoglobin: 12.2 g/dL — ABNORMAL LOW (ref 13.0–17.0)
Potassium: 3.6 mEq/L (ref 3.5–5.1)
Sodium: 143 mEq/L (ref 135–145)

## 2011-01-10 LAB — RENAL FUNCTION PANEL
Albumin: 2.5 g/dL — ABNORMAL LOW (ref 3.5–5.2)
BUN: 32 mg/dL — ABNORMAL HIGH (ref 6–23)
BUN: 51 mg/dL — ABNORMAL HIGH (ref 6–23)
CO2: 24 mEq/L (ref 19–32)
Calcium: 8.6 mg/dL (ref 8.4–10.5)
Calcium: 8.8 mg/dL (ref 8.4–10.5)
Creatinine, Ser: 12.37 mg/dL — ABNORMAL HIGH (ref 0.4–1.5)
Creatinine, Ser: 9.98 mg/dL — ABNORMAL HIGH (ref 0.4–1.5)
GFR calc Af Amer: 6 mL/min — ABNORMAL LOW (ref >60)
GFR calc non Af Amer: 5 mL/min — ABNORMAL LOW (ref >60)
Phosphorus: 6.8 mg/dL — ABNORMAL HIGH (ref 2.3–4.6)
Potassium: 4.3 mEq/L (ref 3.5–5.1)

## 2011-01-10 LAB — APTT: aPTT: 34 seconds (ref 24–37)

## 2011-01-10 LAB — POCT I-STAT, CHEM 8
Calcium, Ion: 1.13 mmol/L (ref 1.12–1.32)
Creatinine, Ser: 8 mg/dL — ABNORMAL HIGH (ref 0.4–1.5)
Hemoglobin: 9.9 g/dL — ABNORMAL LOW (ref 13.0–17.0)
Sodium: 139 mEq/L (ref 135–145)
TCO2: 31 mmol/L (ref 0–100)

## 2011-01-10 LAB — DIFFERENTIAL
Basophils Absolute: 0 10*3/uL (ref 0.0–0.1)
Basophils Relative: 0 % (ref 0–1)
Lymphocytes Relative: 8 % — ABNORMAL LOW (ref 12–46)
Neutro Abs: 8.6 10*3/uL — ABNORMAL HIGH (ref 1.7–7.7)

## 2011-01-10 LAB — PROTIME-INR: Prothrombin Time: 16.2 seconds — ABNORMAL HIGH (ref 11.6–15.2)

## 2011-01-16 ENCOUNTER — Encounter: Payer: Medicare Other | Admitting: Vascular Surgery

## 2011-01-17 ENCOUNTER — Encounter (INDEPENDENT_AMBULATORY_CARE_PROVIDER_SITE_OTHER): Payer: Medicare Other | Admitting: Vascular Surgery

## 2011-01-17 ENCOUNTER — Encounter (INDEPENDENT_AMBULATORY_CARE_PROVIDER_SITE_OTHER): Payer: Medicare Other

## 2011-01-17 DIAGNOSIS — I739 Peripheral vascular disease, unspecified: Secondary | ICD-10-CM

## 2011-01-17 DIAGNOSIS — Z48812 Encounter for surgical aftercare following surgery on the circulatory system: Secondary | ICD-10-CM

## 2011-01-20 ENCOUNTER — Ambulatory Visit (INDEPENDENT_AMBULATORY_CARE_PROVIDER_SITE_OTHER): Payer: Medicare Other | Admitting: Internal Medicine

## 2011-01-20 ENCOUNTER — Encounter: Payer: Self-pay | Admitting: Internal Medicine

## 2011-01-20 DIAGNOSIS — K259 Gastric ulcer, unspecified as acute or chronic, without hemorrhage or perforation: Secondary | ICD-10-CM

## 2011-01-20 DIAGNOSIS — N19 Unspecified kidney failure: Secondary | ICD-10-CM

## 2011-01-20 DIAGNOSIS — D509 Iron deficiency anemia, unspecified: Secondary | ICD-10-CM

## 2011-01-20 DIAGNOSIS — I10 Essential (primary) hypertension: Secondary | ICD-10-CM

## 2011-01-20 NOTE — Assessment & Plan Note (Addendum)
End-stage renal disease on Tuesday Thursday Saturday schedule, patient was hospitalized for an AV graft placement. Has not been yet used. Is tolerating dialysis well. No other active issues

## 2011-01-20 NOTE — Assessment & Plan Note (Signed)
On erythropoietin with hemodialysis, managed by renal

## 2011-01-20 NOTE — Assessment & Plan Note (Signed)
OFFICE VISIT  FOCH, ROSENWALD DOB:  [birth year]                                       01/17/2011 [id number]#:[id number]  This is a postoperative followup.  HISTORY OF PRESENT ILLNESS:  This is a 60 year old gentleman that underwent a thigh graft placement by Dr. Edilia Bo on 11/22/2010. Unfortunately this was complicated by hypotension postoperatively, a GI bleed and an episode of asystole in which the patient required CPR and full ACLS to resuscitate the patient.  Initially he had deteriorated so much that they were considering withdrawal of care.  However, he made a significant improvement and presents today with a right great toe wound. In discussing the patient's postoperative course with the family they believe his right great toe was already ischemic in appearance immediately after his CPR and since then they think it has gotten worse with increased drainage and discoloration.  His past medical history, past surgical history, medications, allergies, family history and review of systems are all unchanged from previous. This patient cannot give any history due to his limitations.  PHYSICAL EXAMINATION:  Vital signs:  Today he had a blood pressure of 137/84 on the left side, a heart rate of 105, respirations 24. General:  Minimally responsive.  Eyes were open. On focused exam the right leg has a well-healed incision.  There is a patent arteriovenous graft in the thigh with a palpable pulse and thrill throughout it.  The right leg has a palpable femoral.  No palpable popliteal, strongly palpable dorsalis pedis, weakly palpable posterior tibial.  There is absolutely no ischemic changes in any of the other toes except for the great toe which has dry gangrene at the tip and there is evidence of freshly desquamated tissue around this area of dry gangrene.  I do not see any frank bone exposure at this point.  He had also ABIs bilaterally which demonstrate on the  right side 0.68, on the left 0.79.  This is decreased from previously in which he had on the right 1.14 and 0.97 on the left.  The blood flow in the tibials is biphasic.  On the left the tibials are triphasic and biphasic.  With compression strangely there was not augmentation of the blood flow. Actually there was abatement of the blood flow so it is unclear exactly what is happening.  There was not a toe pressure obtained due to the ulcer on the great toe.  MEDICAL DECISION MAKING:  This is a 60 year old gentleman who has 2 different issues.  One, he has toe gangrene which I suspect due to location and the history from the family may be possible embolic phenomenon probably related to his cardiac arrest and subsequent resuscitation with CPR and ACLS.  Two, he has a possible degree of steal in his right leg.  However, he has a palpable pulse in the DP so I do not think that there is any absolute indication to remove this graft. Additionally, bilaterally the ABIs have decreased so it points toward something else being the source of this reduction as is bilateral.  As the patient sustained a cardiac arrest, his cardiac output may be decreased.  The other issue is that strangely the flow to the lower extremity should have augmented with compression of the graft rather than abated so there is something unusual going on here also.  The patient at this point  does not need any immediate interventions.  I would not ligate this graft.  He has not followed up with Dr. Edilia Bo so we need to get him set up for followup with Dr. Edilia Bo.    Fransisco Hertz, MD Electronically Signed  BLC/MEDQ  D:  01/17/2011  T:  01/20/2011  Job:  [id number]

## 2011-01-20 NOTE — Progress Notes (Signed)
  Subjective:    Patient ID: [name], male    DOB: Jul 31, 1951, 60 y.o.   MRN: [id number]  HPI  60 yom with pmh outlined in this chart.  He is here after hospitalization for right thigh AV graft placement.  The site appears well with good pulses, he is eating well.  He is here with his caretaker.  He is receiving HD via a temp catheter in his left groin. No recent myoclonic spells.  No major concerns from his care taker.  Review of Systems  [all other systems reviewed and are negative       Objective:   Physical Exam  Constitutional: He is oriented to person, place, and time. He appears well-developed and well-nourished.  HENT:  Head: Normocephalic and atraumatic.  Neck: Normal range of motion. Neck supple. No thyromegaly present.  Cardiovascular: Normal rate and regular rhythm.   Pulmonary/Chest: Effort normal and breath sounds normal.  Abdominal: Soft. Bowel sounds are normal.  Musculoskeletal: Normal range of motion. He exhibits no edema.       R big toe minor lesion on distal toe (resolving)  Neurological: He is alert and oriented to person, place, and time.  Skin: Skin is warm and dry.          Assessment & Plan:

## 2011-01-20 NOTE — Assessment & Plan Note (Signed)
Today the patient's blood pressures excellently controlled, continue to monitor, no changes made

## 2011-01-20 NOTE — Assessment & Plan Note (Signed)
No complaints, continue Protonix

## 2011-01-23 LAB — CBC
HCT: 25.5 % — ABNORMAL LOW (ref 39.0–52.0)
Hemoglobin: 8.5 g/dL — ABNORMAL LOW (ref 13.0–17.0)
MCHC: 33.3 g/dL (ref 30.0–36.0)
MCV: 97 fL (ref 78.0–100.0)
Platelets: 247 10*3/uL (ref 150–400)
RBC: 2.31 MIL/uL — ABNORMAL LOW (ref 4.22–5.81)
RBC: 2.63 MIL/uL — ABNORMAL LOW (ref 4.22–5.81)
RDW: 17.8 % — ABNORMAL HIGH (ref 11.5–15.5)
WBC: 6.1 10*3/uL (ref 4.0–10.5)

## 2011-01-23 LAB — CROSSMATCH
ABO/RH(D): B POS
Antibody Screen: NEGATIVE

## 2011-01-23 LAB — RENAL FUNCTION PANEL
Albumin: 3.2 g/dL — ABNORMAL LOW (ref 3.5–5.2)
Albumin: 3.5 g/dL (ref 3.5–5.2)
BUN: 6 mg/dL (ref 6–23)
CO2: 32 mEq/L (ref 19–32)
Chloride: 100 mEq/L (ref 96–112)
Chloride: 94 mEq/L — ABNORMAL LOW (ref 96–112)
Creatinine, Ser: 4.94 mg/dL — ABNORMAL HIGH (ref 0.4–1.5)
GFR calc Af Amer: 5 mL/min — ABNORMAL LOW (ref >60)
GFR calc non Af Amer: 12 mL/min — ABNORMAL LOW (ref >60)
GFR calc non Af Amer: 4 mL/min — ABNORMAL LOW (ref >60)
Phosphorus: 5.3 mg/dL — ABNORMAL HIGH (ref 2.3–4.6)
Potassium: 3.1 mEq/L — ABNORMAL LOW (ref 3.5–5.1)
Potassium: 4.1 mEq/L (ref 3.5–5.1)
Sodium: 141 mEq/L (ref 135–145)

## 2011-01-23 LAB — POCT I-STAT, CHEM 8
Calcium, Ion: 1.16 mmol/L (ref 1.12–1.32)
Glucose, Bld: 166 mg/dL — ABNORMAL HIGH (ref 70–99)
HCT: 28 % — ABNORMAL LOW (ref 39.0–52.0)
TCO2: 33 mmol/L (ref 0–100)

## 2011-01-23 LAB — CARNITINE / ACYLCARNITINE PROFILE, BLD
Carnitine, Ester: 7 umol/L (ref 3.8–19.0)
Carnitine, Total: 22 umol/L — ABNORMAL LOW (ref 42.0–81.0)

## 2011-01-23 LAB — DIFFERENTIAL
Basophils Absolute: 0 10*3/uL (ref 0.0–0.1)
Basophils Relative: 0 % (ref 0–1)
Eosinophils Absolute: 0.1 10*3/uL (ref 0.0–0.7)
Eosinophils Relative: 1 % (ref 0–5)
Monocytes Absolute: 0.8 10*3/uL (ref 0.1–1.0)
Monocytes Relative: 9 % (ref 3–12)

## 2011-01-23 LAB — POCT CARDIAC MARKERS: Troponin i, poc: <0.05 ng/mL (ref 0.00–0.09)

## 2011-01-23 LAB — CULTURE, BLOOD (ROUTINE X 2)

## 2011-01-23 LAB — HEMOGLOBIN A1C: Mean Plasma Glucose: 126 mg/dL

## 2011-01-23 LAB — TSH: TSH: 2.827 u[IU]/mL (ref 0.350–4.500)

## 2011-01-23 LAB — CORTISOL: Cortisol, Plasma: 11.5 ug/dL

## 2011-01-23 LAB — GLUCOSE, CAPILLARY

## 2011-01-24 LAB — BASIC METABOLIC PANEL
BUN: 21 mg/dL (ref 6–23)
Creatinine, Ser: 9.54 mg/dL — ABNORMAL HIGH (ref 0.4–1.5)
GFR calc non Af Amer: 6 mL/min — ABNORMAL LOW (ref >60)
Glucose, Bld: 144 mg/dL — ABNORMAL HIGH (ref 70–99)
Potassium: 4.1 mEq/L (ref 3.5–5.1)

## 2011-01-24 LAB — CBC
HCT: 28.4 % — ABNORMAL LOW (ref 39.0–52.0)
Platelets: 174 10*3/uL (ref 150–400)
RDW: 16.1 % — ABNORMAL HIGH (ref 11.5–15.5)

## 2011-01-24 LAB — DIFFERENTIAL
Basophils Absolute: 0 10*3/uL (ref 0.0–0.1)
Eosinophils Absolute: 0 10*3/uL (ref 0.0–0.7)
Eosinophils Relative: 0 % (ref 0–5)
Lymphocytes Relative: 13 % (ref 12–46)
Neutrophils Relative %: 80 % — ABNORMAL HIGH (ref 43–77)

## 2011-01-24 LAB — PROTIME-INR: Prothrombin Time: 14.9 seconds (ref 11.6–15.2)

## 2011-01-24 LAB — TYPE AND SCREEN: ABO/RH(D): B POS

## 2011-01-26 LAB — DIFFERENTIAL
Basophils Absolute: 0 10*3/uL (ref 0.0–0.1)
Basophils Relative: 0 % (ref 0–1)
Eosinophils Absolute: 0.1 10*3/uL (ref 0.0–0.7)
Monocytes Absolute: 0.3 10*3/uL (ref 0.1–1.0)
Monocytes Relative: 6 % (ref 3–12)
Neutro Abs: 3.2 10*3/uL (ref 1.7–7.7)
Neutrophils Relative %: 66 % (ref 43–77)

## 2011-01-26 LAB — CBC
HCT: 36.6 % — ABNORMAL LOW (ref 39.0–52.0)
HCT: 36.6 % — ABNORMAL LOW (ref 39.0–52.0)
HCT: 39.7 % (ref 39.0–52.0)
MCHC: 32.8 g/dL (ref 30.0–36.0)
MCHC: 33.6 g/dL (ref 30.0–36.0)
MCV: 93.9 fL (ref 78.0–100.0)
MCV: 94.3 fL (ref 78.0–100.0)
MCV: 95.9 fL (ref 78.0–100.0)
Platelets: 136 10*3/uL — ABNORMAL LOW (ref 150–400)
Platelets: 137 10*3/uL — ABNORMAL LOW (ref 150–400)
Platelets: DECREASED 10*3/uL (ref 150–400)
RBC: 4.29 MIL/uL (ref 4.22–5.81)
RDW: 14.9 % (ref 11.5–15.5)
RDW: 15.6 % — ABNORMAL HIGH (ref 11.5–15.5)
WBC: 4.8 10*3/uL (ref 4.0–10.5)
WBC: 4.9 10*3/uL (ref 4.0–10.5)

## 2011-01-26 LAB — RENAL FUNCTION PANEL
Albumin: 3.4 g/dL — ABNORMAL LOW (ref 3.5–5.2)
Albumin: 3.7 g/dL (ref 3.5–5.2)
BUN: 33 mg/dL — ABNORMAL HIGH (ref 6–23)
BUN: 64 mg/dL — ABNORMAL HIGH (ref 6–23)
Calcium: 9.6 mg/dL (ref 8.4–10.5)
Chloride: 93 mEq/L — ABNORMAL LOW (ref 96–112)
Creatinine, Ser: 10.62 mg/dL — ABNORMAL HIGH (ref 0.4–1.5)
Creatinine, Ser: 13.95 mg/dL — ABNORMAL HIGH (ref 0.4–1.5)
GFR calc Af Amer: 6 mL/min — ABNORMAL LOW (ref >60)
Phosphorus: 3.6 mg/dL (ref 2.3–4.6)
Phosphorus: 5.1 mg/dL — ABNORMAL HIGH (ref 2.3–4.6)

## 2011-01-26 LAB — COMPREHENSIVE METABOLIC PANEL
ALT: 27 U/L (ref 0–53)
Albumin: 4.3 g/dL (ref 3.5–5.2)
Alkaline Phosphatase: 147 U/L — ABNORMAL HIGH (ref 39–117)
BUN: 16 mg/dL (ref 6–23)
Chloride: 94 mEq/L — ABNORMAL LOW (ref 96–112)
Glucose, Bld: 98 mg/dL (ref 70–99)
Potassium: 4.1 mEq/L (ref 3.5–5.1)
Sodium: 140 mEq/L (ref 135–145)
Total Bilirubin: 0.8 mg/dL (ref 0.3–1.2)
Total Protein: 8.9 g/dL — ABNORMAL HIGH (ref 6.0–8.3)

## 2011-01-26 LAB — CARDIAC PANEL(CRET KIN+CKTOT+MB+TROPI)
CK, MB: 0.7 ng/mL (ref 0.3–4.0)
CK, MB: 1 ng/mL (ref 0.3–4.0)
Relative Index: INVALID (ref 0.0–2.5)
Total CK: 56 U/L (ref 7–232)
Total CK: 64 U/L (ref 7–232)
Troponin I: 0.01 ng/mL (ref 0.00–0.06)

## 2011-01-26 LAB — ACTH STIMULATION, 3 TIME POINTS: Cortisol, 60 Min: 15.8 ug/dL (ref >20)

## 2011-01-26 LAB — GLUCOSE, CAPILLARY: Glucose-Capillary: 84 mg/dL (ref 70–99)

## 2011-01-26 LAB — PHOSPHORUS: Phosphorus: 4 mg/dL (ref 2.3–4.6)

## 2011-01-26 LAB — AMMONIA: Ammonia: 30 umol/L (ref 11–35)

## 2011-01-26 LAB — CORTISOL: Cortisol, Plasma: 9.5 ug/dL

## 2011-01-26 LAB — TYPE AND SCREEN: ABO/RH(D): B POS

## 2011-01-26 LAB — TSH: TSH: 4.436 u[IU]/mL (ref 0.350–4.500)

## 2011-01-26 LAB — BASIC METABOLIC PANEL
BUN: 63 mg/dL — ABNORMAL HIGH (ref 6–23)
CO2: 29 mEq/L (ref 19–32)
Chloride: 94 mEq/L — ABNORMAL LOW (ref 96–112)
Potassium: 4.8 mEq/L (ref 3.5–5.1)

## 2011-01-26 LAB — CULTURE, BLOOD (ROUTINE X 2): Culture: NO GROWTH

## 2011-01-29 LAB — COMPREHENSIVE METABOLIC PANEL
AST: 26 U/L (ref 0–37)
Albumin: 3.6 g/dL (ref 3.5–5.2)
Chloride: 102 mEq/L (ref 96–112)
Creatinine, Ser: 9.74 mg/dL — ABNORMAL HIGH (ref 0.4–1.5)
GFR calc Af Amer: 7 mL/min — ABNORMAL LOW (ref >60)
Potassium: 4.2 mEq/L (ref 3.5–5.1)
Total Bilirubin: 0.9 mg/dL (ref 0.3–1.2)

## 2011-01-29 LAB — RENAL FUNCTION PANEL
Albumin: 3.4 g/dL — ABNORMAL LOW (ref 3.5–5.2)
CO2: 28 mEq/L (ref 19–32)
Calcium: 9.7 mg/dL (ref 8.4–10.5)
Creatinine, Ser: 13.57 mg/dL — ABNORMAL HIGH (ref 0.4–1.5)
GFR calc Af Amer: 5 mL/min — ABNORMAL LOW (ref >60)
GFR calc non Af Amer: 4 mL/min — ABNORMAL LOW (ref >60)
Phosphorus: 6.1 mg/dL — ABNORMAL HIGH (ref 2.3–4.6)
Sodium: 144 mEq/L (ref 135–145)

## 2011-01-29 LAB — POCT I-STAT, CHEM 8
Calcium, Ion: 1.24 mmol/L (ref 1.12–1.32)
Chloride: 105 mEq/L (ref 96–112)
HCT: 41 % (ref 39.0–52.0)
Potassium: 3.8 mEq/L (ref 3.5–5.1)

## 2011-01-29 LAB — CBC
HCT: 34.5 % — ABNORMAL LOW (ref 39.0–52.0)
MCHC: 33.8 g/dL (ref 30.0–36.0)
Platelets: 112 10*3/uL — ABNORMAL LOW (ref 150–400)
Platelets: 118 10*3/uL — ABNORMAL LOW (ref 150–400)
RDW: 16.9 % — ABNORMAL HIGH (ref 11.5–15.5)
RDW: 18.7 % — ABNORMAL HIGH (ref 11.5–15.5)
WBC: 5.3 10*3/uL (ref 4.0–10.5)

## 2011-01-29 LAB — DIFFERENTIAL
Basophils Absolute: 0 10*3/uL (ref 0.0–0.1)
Basophils Relative: 0 % (ref 0–1)
Neutro Abs: 5.3 10*3/uL (ref 1.7–7.7)
Neutrophils Relative %: 77 % (ref 43–77)

## 2011-01-29 LAB — CARDIAC PANEL(CRET KIN+CKTOT+MB+TROPI): Relative Index: INVALID (ref 0.0–2.5)

## 2011-01-29 LAB — HEPATITIS PANEL, ACUTE
Hep A IgM: NEGATIVE
Hep B C IgM: NEGATIVE
Hepatitis B Surface Ag: NEGATIVE

## 2011-01-29 LAB — CATH TIP CULTURE: Culture: NO GROWTH

## 2011-02-05 ENCOUNTER — Ambulatory Visit (INDEPENDENT_AMBULATORY_CARE_PROVIDER_SITE_OTHER): Payer: Medicare Other | Admitting: Vascular Surgery

## 2011-02-05 ENCOUNTER — Ambulatory Visit
Admission: RE | Admit: 2011-02-05 | Discharge: 2011-02-05 | Disposition: A | Discharge status: Home / Self Care | Payer: Medicare Other | Source: Ambulatory Visit | Attending: Vascular Surgery | Admitting: Vascular Surgery

## 2011-02-05 ENCOUNTER — Other Ambulatory Visit: Payer: Self-pay | Admitting: Vascular Surgery

## 2011-02-05 DIAGNOSIS — M869 Osteomyelitis, unspecified: Secondary | ICD-10-CM

## 2011-02-05 DIAGNOSIS — N186 End stage renal disease: Secondary | ICD-10-CM

## 2011-02-05 DIAGNOSIS — T82898A Other specified complication of vascular prosthetic devices, implants and grafts, initial encounter: Secondary | ICD-10-CM

## 2011-02-06 ENCOUNTER — Other Ambulatory Visit: Payer: Self-pay | Admitting: *Deleted

## 2011-02-06 MED ORDER — CLONAZEPAM 0.5 MG PO TABS: 0.2500 mg | ORAL_TABLET | Freq: Two times a day (BID) | ORAL | Status: DC

## 2011-02-06 NOTE — Assessment & Plan Note (Signed)
OFFICE VISIT  JENNER, ROSIER DOB:  06/10/1951                                       02/05/2011 [id number]#:[id number]  I saw this patient in the office today concerning a wound on the tip of his right great toe.  He underwent placement of a right thigh AV graft on 11/22/2010 and had a prolonged postoperative course largely because of problems of hypertension.  Ultimately he was discharged and had a small wound on the tip of his right great toe at the time of discharge. He came on 01/17/2011.  At that time he had ABIs done which were 68% on the right and 79% on the left with biphasic flow in the right foot.  His right thigh AV graft has been working well and he still has a catheter in the left groin.  PHYSICAL EXAMINATION:  Temperature is 98.  Blood pressure 116/79, heart rate is 91.  He has a superficial wound on the right great toe.  I cannot palpate a dorsalis pedis pulse.  He does have a biphasic dorsalis pedis signal with a Doppler. He has a good thrill in his AV graft and his right groin incision is healed.  He has a palpable left femoral pulse.  I have ordered an x-ray of the right foot to rule out osteomyelitis of the right great toe.  Will do the dressing changes with Santyl in hopes that we an gradually get this wound to heal.  If it does not heal I think we should proceed with arteriography and evaluate what options we might have to increase flow to the right foot yet maintain his graft. Given his very difficult postoperative course and his history of mental retardation, I would be very reluctant to consider placing a new graft on the left side in order to sacrifice the graft on the right and if at all possible we want to maintain patency of his graft in the right thigh.  His postoperative course was previously complicated by hypertension and cardiac arrest in addition to a GI bleed.  I will see him back in 3 weeks with x-ray of his foot.  In the  meantime will have home health do his dressing changes daily with Santyl.    Di Kindle. Edilia Bo, M.D. Electronically Signed  CSD/MEDQ  D:  02/05/2011  T:  02/06/2011  Job:  [id number]  cc:   [facility]

## 2011-02-14 ENCOUNTER — Ambulatory Visit (HOSPITAL_COMMUNITY): Payer: Medicare Other | Attending: Vascular Surgery

## 2011-02-14 DIAGNOSIS — N186 End stage renal disease: Secondary | ICD-10-CM | POA: Insufficient documentation

## 2011-02-14 DIAGNOSIS — Z452 Encounter for adjustment and management of vascular access device: Secondary | ICD-10-CM | POA: Insufficient documentation

## 2011-02-14 NOTE — Telephone Encounter (Signed)
Called to [facility]

## 2011-02-26 ENCOUNTER — Ambulatory Visit (INDEPENDENT_AMBULATORY_CARE_PROVIDER_SITE_OTHER): Payer: Medicare Other | Admitting: Vascular Surgery

## 2011-02-26 DIAGNOSIS — L98499 Non-pressure chronic ulcer of skin of other sites with unspecified severity: Secondary | ICD-10-CM

## 2011-02-26 DIAGNOSIS — N186 End stage renal disease: Secondary | ICD-10-CM

## 2011-02-27 NOTE — Assessment & Plan Note (Signed)
OFFICE VISIT  [name], [name] DOB:  1951-07-11                                       02/26/2011 [id number]#:[id number]  I saw patient in the office today for continued follow-up of the small wound on the tip of his right great toe.  He had undergone placement of a right thigh AV graft on 11/22/10 and had a prolonged, difficult postoperative course because of issues with hypotension.  He had developed a small wound on the tip of his right great toe.  We have been following this.  He comes in to have this checked.  Of note, since his last visit, we did obtain an x-ray which showed no evidence of osteomyelitis of the right great toe.  PHYSICAL EXAMINATION:  He has had his left thigh catheter removed. Blood pressure is 117/77, heart rate is 99.  He has an excellent thrill in his right thigh AV graft, and the incision in the right groin is healed.  The toe wound remains stable.  I have instructed his mother to have them soak the foot daily in lukewarm Dial Soap soaks to help keep this wound dry and will have to keep a close eye on this.  I will plan on seeing him back in 2 months. If things change, she knows to call sooner.  I am very reluctant to consider arteriography and evaluation for revascularization, given all the problems he had with his last hospitalization and unfortunately, patient has great difficulty with being in the hospital.  Will continue to follow this closely and hope that this will slowly heal.  If it does not, we would have to consider arteriography and options for revascularization that might still salvage his graft.    Di Kindle. Edilia Bo, M.D. Electronically Signed  CSD/MEDQ  D:  02/26/2011  T:  02/27/2011  Job:  [id number]  cc:   [facility]

## 2011-03-04 NOTE — Discharge Summary (Signed)
[name], [name]                 ACCOUNT NO.:  [id number]   MEDICAL RECORD NO.:  [id number]          PATIENT TYPE:  INP   LOCATION:  9562                         FACILITY:  [facility]   PHYSICIAN:  Fransisco Hertz, M.D.  DATE OF BIRTH:  03/15/1951   DATE OF ADMISSION:  01/30/2009  DATE OF DISCHARGE:  02/01/2009                               DISCHARGE SUMMARY   DISCHARGE DIAGNOSES:  1. Right hip pain, most likely etiology musculoskeletal with leading      differential diagnosis of trochanteric bursitis, resolving upon      discharge.  2. End-stage renal disease, hemodialysis catheter pulled by Nephrology      after recent bacteremia and question of associated hemodialysis      catheter source.  The patient has a usable arteriovenous graft or      fistula.  3. History of antral ulcer with Helicobacter pylori status post triple      therapy.  4. Nonbleeding esophageal varices, diagnosed in November 2009.  5. Anemia of chronic disease.  6. Hypertension.  7. Mutism.  8. Cognitive delay.  9. Hyperlipidemia.  10.Hypertension.   DISCHARGE MEDICATIONS:  Please note that only the ibuprofen was added to  the patient's home medications and he is provided a prescription for  this.  1. Ibuprofen p.o. 800 mg t.i.d. x1 week.  2. Nephro-Vite p.o. 1 tablet daily.  3. Protonix p.o. 40 mg daily.  4. Nadolol p.o. 20 mg after dialysis.  5. Colace p.o. 100 mg b.i.d.  6. Tums 2 tablets t.i.d.  7. Aranesp shots provided with hemodialysis.  8. Lipitor p.o. 20 mg daily.   CONDITION AT DISCHARGE:  The patient's right hip pain had improved while  the patient was hospitalized.  He worked with physical therapy and they  were able to have him walk and he was stable on his feet.  Prior to  discharge, I walked with the patient and he was in no obvious distress.  The patient is mute and has cognitive delays making his history and  following the resolution of his symptoms somewhat difficult.  The  patient is  a very nice gentleman, unfortunately he is quite fearful of  any hospital interventions and being in the hospital.  For this reason,  he refused all of his medications and any food while he was in the  hospital.  For this reason, along with apparent resolution of his  symptoms, he was discharged on April 15.  We set up home health physical  therapy to work with him over the next week or two to ensure resolution  of his right hip pain.   CONSULTATIONS:  None.   IMAGING:  1. 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  No acute findings.  2. Right hip x-ray, complete right hip DJD worse from 2008.  3. Chest x-ray; dialysis catheter is in place, no acute      cardiopulmonary process identified.  There is scoliosis.   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  He had  difficulty walking and apparent pain in his right hip at his dialysis  center on the day of admission, so EMS was called and he was brought to  the ED.  The patient is mute, but is interactive.  Initial when I asked  to point any painful areas, he did not respond.  Then he pointed to his  right index finger.  Later during the exam, he pointed to his left flank  and left upper quadrant, though he did not appear to be tender in these  locations.   According to his caregiver, 2 weeks prior to admission, he had an ED  visit for vomiting and apparently this resolved.  He did take it easy  for couple days.  Then he did seem a bit stiff getting out of the care.  Mrs. [name], his caretaker, reports he had an unsteady gait for  approximately 2 weeks and this has been getting gradually worse.  Earlier this morning, he was frowning and looking like he was hurting  when he stood.  In the past, he has had  cramps and she thinks he might  have been having these again.  He needed help from a neighbor to walk  down the stairs today and needed help from 2 people to get to his [name] Niece  for dialysis.  He ate breakfast okay this morning but refused a snack  which is unusual for him.  He has had no hematemesis, bright red blood  per rectum, or melena.  No fevers or chills.  No apparent chest pain or  dyspnea or abdominal pain.  She did say that he has not had a bowel  movement at home for the past 3 days or so which is unusual for him.  Typically, he has a bowel movement every day.   PHYSICAL EXAMINATION:  VITAL SIGNS:  Temperature 97.5, pulse is 77,  blood pressure 131/79, respiratory rate of 18, and O2 saturation 98% on  room air.  GENERAL:  No apparent distress.  EYES:  PERRL.  ENT:  Moist oral mucosa.  NECK:  Supple.  No JVD.  RESPIRATORY:  Normal respiratory effort, shallow inspirations on exam.  Fair air movement.  Clear to auscultation bilaterally.  CARDIOVASCULAR:  Regular rate and rhythm.  No murmurs, rubs, or gallops.  Right subclavian Diatek catheter in place.  GI:  Bowel sounds normoactive and frequent, mildly obese, soft,  nontender, and nondistended.  No rebound.  No guarding.  RECTAL:  Refused.  No visible external abnormalities.  EXTREMITIES:  No edema.  MUSCULOSKELETAL:  No bruises or other visible abnormalities of legs.  There was decreased range of motion throughout, but I cannot be certain  how much of this is due to poor cooperation and involuntary muscle  contraction.  He has no tenderness evident of either hip and his knees  or in his ankles or feet.  He did have some tenderness when we rolled  him on his side to examine his back, but it was not clear where this  was.  It was at this point that he seemed to motion to his left flank  and left abdomen.  NEURO:  Limited exam due to cooperation.  Strength appears intact and  symmetric and he was able to move all extremities.   Gait not assessed by  [place], but per report of ED physician, the patient was unable to bear  weight.   INITIAL LABORATORY DATA:  Hemoglobin of 12.8, platelets of 118, and  white blood cell count 7.0.  Sodium 144, potassium 4.2, chloride of 102,  bicarbonate 32, glucose 100, BUN 20, and creatinine 10.74.   HOSPITAL COURSE:  1. Right hip pain, most likely musculoskeletal:  The patient was      admitted after showing instability and motioning to pain in his      right hip.  A hip CT was performed which showed no evidence of      fractures, but did reveal degenerative changes of the right hip.      He did not have fever or an  elevated white blood cell count and      did not develop one during his hospitalization.  However, he is on      dialysis and had recent positive blood cultures per Nephrology, but      these are not in our system.  He improved by the second day of      hospitalization and worked well with physical therapy and was      mobile and in no acute distress upon walking.  Upon examination, he      did have point tenderness over his right hip, thus pointing to a      possible diagnosis of trochanteric bursitis.  The patient refused      medications while he was in the hospital secondary to his      significant fear of medicine and hospitalizations.  He does do well      with his caretaker at home and he was provided with prescription      for ibuprofen for 7 days.  He was also seen by the home health      coordinator and will have home health physical therapy set up upon      discharge.  2. End-stage renal disease:  The patient was dialyzed during his      hospitalization according to his normal Tuesday, Thursday, and      Saturday schedule.  Given that the patient had a recent bacteremia,      it was decided by the nephrologist to take out his Diatek catheter      and just use his left AV graft or fistula.  The removal of his      catheter was performed on April 15 and the  cultures from the      catheter tip are pending.  The patient will continue with his dialysis as scheduled and they have  been managing his antibiotics for his reported asymptomatic bacteremia.  1. Esophageal varices:  The patient was continued on nadolol during      this hospitalization.  At this time, we do not have an origin for      the patient's varices as he has no known liver dysfunction.  We did      put in for an acute hepatitis panel, and results of this are      pending.  2. Constipation:  The patient had not any bowel movement for a couple      days, we attempted to give him MiraLax while he was in the      hospital, but again he refused this medication.  He did not have a      bowel movement while he was in here, but he should continue with  his home regimen and hopefully will improve at home where he will      take his medications under his normal supervision.   DISCHARGE VITALS SIGNS:  Temperature of 97.9, pulse 105, blood pressure  101/71, O2 saturation 97% on room air, and respiratory rate of 20.   White blood cell count 5.3, hemoglobin 1.6, and platelets of 112.  Sodium of 144, potassium 4.5, chloride of 103, BUN of 33, creatinine of  13.57, and calcium of 9.7.   PENDING LABS:  Please follow up on the patient's acute hepatitis panel,  as well as the catheter tip culture from this hospitalization.       Linward Foster, MD  Electronically Signed      Fransisco Hertz, M.D.  Electronically Signed    LW/MEDQ  D:  02/02/2009  T:  02/03/2009  Job:  [id number]   cc:   Dr. Maple Hudson

## 2011-03-04 NOTE — Op Note (Signed)
[name], LASSER NO.:  [id number]   MEDICAL RECORD NO.:  [id number]          PATIENT TYPE:  INP   LOCATION:  2102                         FACILITY:  [facility]   PHYSICIAN:  Bernette Redbird, M.D.   DATE OF BIRTH:  Jun 24, 1951   DATE OF PROCEDURE:  08/16/2008  DATE OF DISCHARGE:                               OPERATIVE REPORT   PROCEDURE:  Upper endoscopy with biopsy and control of hemorrhage.   INDICATION:  This is a 60 year old dialysis patient with mental  retardation who was admitted through the emergency room today following  several episodes of coffee-ground emesis.  His NG tube is putting out  dark motor oil material.  He was somewhat hypotensive on admission  with some improvement following IV fluids.   FINDINGS:  1. Nonbleeding gastric ulcer with stigma of recent hemorrhage.  2. Mild-to-moderate esophageal varices.   PROCEDURE:  The nature, purpose, and risks of the procedure have been  discussed with the patient's sister and health care power of attorney,  Mearl Latin [phone], cell phone [phone]) who provided written  consent on his behalf since he has mental retardation.  Procedure was  done at the bedside in the Endoscopy Unit.  He had already received  Ativan and Haldol in the emergency room and was somewhat somnolent, but  we gave Versed 2 mg IV as additional sedation since the patient has a  history of combativeness when instrumented.   The Pentax adult video endoscope was passed alongside his NG tube  entering the esophagus easily.  It was advanced into the stomach.  At  this time, the NG tube was removed.  In the stomach, was some dark  motor oil material and some old dark clots but no fresh blood and no  evidence of active bleeding.  A retroflexed view of the cardia was  obscured by the puddled gastric contents.   In the antrum of the stomach, was a roughly 8-mm shallow ulcer with some  adherent strands of clotted blood, no large clot and  no protuberant  visible vessel.  In addition to this ulcer, the antrum of the stomach  had some slight erythema and several focal linear erosions.  There was  no evidence of any mass or polyps.  The pylorus, duodenal bulb, and  second duodenum were unremarkable and indeed there was amber-colored  bile within the duodenal lumen.   I elected to address the ulcer with an injection of 1:10,000  epinephrine, a total of 2 mL into the region of the base of the ulcer,  with good regional blanching.  This intervention did not incite any  hemorrhage.  I also obtained several gastric biopsies to look for  evidence of H. pylori infection.   As the scope was withdrawn through the esophagus, mild-to-moderate  esophageal varices were noted.  This was somewhat of a surprise since  the patient does not have any known liver disease.  Photographs were  obtained.  These varices extended almost all the way up to the proximal  esophagus, probably to about 20 or 23 cm.   No  esophagitis, Mallory-Weiss tear, neoplasia, or infection were noted  in the esophagus.   The patient tolerated the procedure well, and there no apparent  complications.   IMPRESSION:  1. No active bleeding or fresh blood in the stomach at the time of      this exam.  Old dark motor oil blood and clots were noted.  2. Antral gastropathy consistent with aspirin exposure including an 8-      mm ulcer with stigma of hemorrhage (strands of adherent clot) as      well as several small linear erosions.  3. Small-to-moderate esophageal varices involving the lower two-thirds      of the esophagus.  4. Interventions performed including injection of the ulcer with 2 mL      of 1:10,000 epinephrine with good blanching, to reduce risk of re-      bleeding.  Also, antral biopsies obtained to look for evidence of      Helicobacter pylori infection.   RECOMMENDATIONS:  1. I would continue the Protonix infusion.  He is currently receiving       for another 24-36 hours and then switched to oral therapy if he is      doing well with consideration of ongoing PPI therapy for at least 2      months and probably forever, especially if it is decided for that      the patient should go back on his aspirin (a decision that would      probably most appropriately be made by his attending nephrologist).  2. For now, I would add sucralfate as adjunctive antipeptic therapy      while the patient is in-house.  3. Await pathology results.  If there is evidence of H. pylori      infection of the stomach, I would treat it.  4. It is not clear if this patient truly has liver disease since none      was evident on the CT scan and he does not have other biochemical      markers to suggest it, but he does appear to have esophageal      varices, so a review of the CT and consideration of a Doppler      ultrasound of the portal venous system might be appropriate.  5. Followup endoscopy in 2 months could be considered to confirm ulcer      healing, especially if a decision is made to keep the patient on      chronic aspirin therapy with PPI prophylaxis, to assess ulcer      healing and to assess the adequacy of the prophylaxis and      maintaining the patient free of ulcers.  However, if the patient is      not maintained on aspirin going forward, I would probably lean away      from putting him through another endoscopy because interventions on      this patient are made more difficult by virtue of his tendency to      be combative with anything such as needles, injections, tubes, and      so forth and since the observed ulcer looked totally benign, and      the overall antral findings were very consistent with the known      aspirin exposure he has had.           ______________________________  Bernette Redbird, M.D.     RB/MEDQ  D:  08/16/2008  T:  08/17/2008  Job:  [id number]   cc:   Terrial Rhodes, M.D.

## 2011-03-04 NOTE — Assessment & Plan Note (Signed)
OFFICE VISIT   KHOA, OPDAHL  DOB:  [birth year]                                       06/05/2010  EAVWU#:[id number]   I saw the patient in the office today concerning a poorly functioning  right femoral Diatek catheter.  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  He has had a previous right  femoral Diatek although I do not have an op note as to when this was  placed.  Recently he had been having problems with flow within the calf  and we were asked to evaluate him for a new catheter.   Of note, he has had no recent uremic symptoms.  Specifically he denies  any fatigue, anorexia, nausea or vomiting.  He is mentally retarded and  the history is obtained from his family members.   On review of systems he has had no fever or chills.   PHYSICAL EXAMINATION:  General:  This is a pleasant 60 year old  gentleman.  Vital signs:  Blood pressure is 101/60, heart rate is 118.  He is in no acute distress.  Respiratory rate is 12.  Lungs:  Are clear  bilaterally to auscultation.  The catheter in the right groin appears to  be in good position with no erythema or drainage.  He has a palpable  femoral pulse on the left although slightly diminished.  I cannot  palpate pedal pulses.   We will arrange to place a new femoral catheter on 06/14/2010.  If it  needs to be placed sooner we can try to arrange it on a nondialysis day.  We did discuss the possibility at some point in the future placing a  thigh graft.  They have not been willing to proceed with this in the  past.  As I cannot palpate pedal pulses I will have him get followup  ABIs at his next visit to help [place] determine if he is a candidate for a  thigh graft.     Di Kindle. Edilia Bo, M.D.  Electronically Signed   CSD/MEDQ  D:  06/05/2010  T:  06/06/2010  Job:  [id number]   cc:   [facility]

## 2011-03-04 NOTE — Procedures (Signed)
EEG NUMBER:  [id number].   HISTORY:  This is a 60 year old patient with history of mental  retardation, hypotension, mutism, end-stage renal disease on  hemodialysis.  The patient has had several episodes of stiffening and  eyes rolling back.  The patient is being evaluated for possible  seizures.   This is a portable EEG recording.  No skull defects noted.   MEDICATIONS:  Zocor, Protonix, Os-Cal, Hectorol, Tylenol, and Ambien.   EEG classification essentially normal weight.   DESCRIPTION OF THE RECORDING:  The background of this recording consists  of a low-amplitude 8-9 Hz background activity that is reactive.  As the  record progresses, the patient appears to remain in the waking state.  Photic stimulation and hyperventilation were not performed.  At no time,  during the recording, there appear to be evidence of spike, spike wave  discharges, or evidence of focal slowing.  EKG monitor shows no evidence  of cardiac rhythm abnormalities with a heart rate of 72.   IMPRESSION:  This is an essentially normal EEG recording in waking  state.  No evidence of ictal or interictal discharges were seen.      Marlan Palau, M.D.  Electronically Signed     ZOX:WRUE  D:  05/07/2009 14:02:25  T:  05/08/2009 03:14:11  Job #:  [id number]

## 2011-03-04 NOTE — Procedures (Signed)
CEPHALIC VEIN MAPPING   INDICATION:  Right upper extremity vein mapping, preop AVF.   HISTORY:  ESRD with numerous failed left upper extremity AVF.   EXAM:  The right cephalic vein is not compressible.   The left cephalic vein is not evaluated.   IMPRESSION:  1. 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  2. Right basilic vein shows evidence of chronic thrombus throughout      its length.       ___________________________________________  V. Charlena Cross, MD   AS/MEDQ  D:  12/17/2009  T:  12/18/2009  Job:  [id number]

## 2011-03-04 NOTE — Assessment & Plan Note (Signed)
OFFICE VISIT   VALTON, SCHWARTZ  DOB:  05/15/1951                                       12/17/2009  ZOXWR#:[id number]   REASON FOR VISIT:  Access.   HISTORY:  This is a 60 year old gentleman with end-stage renal disease  who undergoes dialysis on Tuesday, Thursday, Saturday, who has had  occlusion of his left upper arm graft.  He now dialyzes through a  femoral catheter.  He comes in today for a new access.  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   PHYSICAL EXAMINATION:  Heart rate 98, blood pressure 101/68, respiratory  rate is 20.  General:  He is in no acute distress.  HEENT:  Within  normal limits.  Extremities:  Warm and well-perfused.  He has a right  radial and brachial pulse.  There are no prominent veins on his right  arm.   DIAGNOSTIC STUDIES:  The patient was vein mapped today.  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   ASSESSMENT/PLAN:  End-stage renal disease, needing permanent access.  Based on the findings of the patient's ultrasound with chronic thrombus  within the basilic and cephalic vein, I think he is a poor candidate for  a fistula.  For that reason, I am scheduling him for a right forearm  graft.  The risks and benefits were discussed with the patient's family.  His operation has been scheduled for Friday, December 28, 2009.     Jorge Ny, MD  Electronically Signed   VWB/MEDQ  D:  12/17/2009  T:  12/18/2009  Job:  [id number]   cc:   Terrial Rhodes, M.D.

## 2011-03-04 NOTE — Op Note (Signed)
NAMEBUSH, [name]                 ACCOUNT NO.:  [id number]   MEDICAL RECORD NO.:  [id number]          PATIENT TYPE:  AMB   LOCATION:  SDS                          FACILITY:  [facility]   PHYSICIAN:  Quita Skye. Hart Rochester, M.D.  DATE OF BIRTH:  1951-08-18   DATE OF PROCEDURE:  12/08/2007  DATE OF DISCHARGE:  12/08/2007                               OPERATIVE REPORT   PREOPERATIVE DIAGNOSIS:  End-stage renal disease with poorly functioning  Diatek catheter, right subclavian vein.   POSTOPERATIVE DIAGNOSIS:  End-stage renal disease with poorly  functioning Diatek catheter, right subclavian vein.   OPERATION:  1. Removal of Diatek catheter, right subclavian vein.  2. Insertion new Diatek catheter (24 cm) via right subclavian vein.   SURGEON:  Quita Skye. Hart Rochester, M.D.   FIRST ASSISTANT:  Nurse.   ANESTHESIA:  LMA.   PROCEDURE:  The patient was taken to the operating room, placed in the  supine position at which time satisfactory LMA anesthesia was  administered.  The upper chest and neck were prepped with Betadine scrub  and solution, draping the poorly functioning Diatek catheter into the  field which was exiting in the right lateral chest wall, having been  inserted through the subclavian vein.  Short incision was made in the  infraclavicular area and the catheter dissected free and transected  distally.  Guidewire passed centrally into the right atrium under  fluoroscopic guidance.  The old catheter was removed over the wire.  There was no evidence of infection.  After dilating the tract  appropriately, a 24 cm Diatek catheter was passed through a peel-away  sheath, positioned in the right atrium, tunneled peripherally and  secured with nylon sutures.  The wound was closed with Vicryl in a  subcuticular fashion, sterile dressing applied, and the remainder of the  old Diatek catheter was removed at its exit site after dissecting the  cuff free which was adjacent to the skin.  Nylon sutures  were placed at  the exit site.  Sterile dressing applied.  The patient taken to recovery  in satisfactory condition.      Quita Skye Hart Rochester, M.D.  Electronically Signed     JDL/MEDQ  D:  12/08/2007  T:  12/09/2007  Job:  [id number]

## 2011-03-04 NOTE — Consult Note (Signed)
NAMEJACSON, [name] NO.:  [id number]   MEDICAL RECORD NO.:  [id number]          PATIENT TYPE:  INP   LOCATION:  2102                         FACILITY:  [facility]   PHYSICIAN:  Bernette Redbird, M.D.   DATE OF BIRTH:  [birth year]   DATE OF CONSULTATION:  08/16/2008  DATE OF DISCHARGE:                                 CONSULTATION   The internal medicine teaching service, Dr. Paulette Blanch Dam,  attending, asked [place] to see this 60 year old mentally retarded, African  American dialysis patient because of coffee-ground emesis.   The patient lives in an alternative home living arrangement with his  caretaker and was noted to have a couple of episodes of coffee-ground  emesis today and was brought to the emergency room where his pressure  was low, in the 70s systolic, but his hemoglobin initially was 10.4  which is about where he normally runs (anemia of chronic disease).  An  NG tube was placed which returned dark motor oil, slightly burgundy  tinged material consistent with old blood mixed with gastric juice.  The  patient has no prior history of GI bleeding.  He is on a daily 325 mg  aspirin and, to my knowledge, is not covered with any ongoing PPI  prophylaxis.   He has no known liver disease.   PAST MEDICAL HISTORY:  End-stage renal disease, hypertension, and a  recent admission for abdominal pain.  The etiology of which was not  confirmed, history of hyperlipidemia, mental retardation, and  nonspecific CHF.   ALLERGIES:   OUTPATIENT MEDICATIONS:  Aspirin vitamins, calcium, Lipitor, Toprol and  Epogen.   OPERATIONS:  I do not believe there have been any previous abdominal  surgeries.   HABITS:  Nonsmoker, nondrinker.   FAMILY HISTORY:  Not obtained, but from reviewing the chart, does not  appear to be relevant.   SOCIAL HISTORY:  As noted, the patient has significant mental  retardation.  The history is obtained primarily from the chart and from  talking  with the house staff since the patient is noncommunicative and  currently sedated.   PHYSICAL EXAM:  VITAL SIGNS:  Blood pressure around 130, systolic blood  pressure approximately 130.  GENERAL:  The patient is noncommunicative, but in no acute distress, he  is without overt pallor or icterus.  CHEST:  Grossly clear with upper airway sounds.  HEART:  Has a 2-3/6 early systolic slightly blowing murmur.  ABDOMEN:  Soft with normal bowel sounds.  No organomegaly, no guarding,  mass, tenderness or obvious ascites.  RECTAL:  Stool was fecal occult blood positive per admitting note and I  did not do a rectal exam.   LABS:  Admission white count 8700 with 74 polys and 15 lymphs,  hemoglobin 10.2 with an MCV of 89, RDW elevated at 19.6.  INR normal at  1.1, BUN 77, creatinine 12.7, potassium 5.6, liver chemistries  essentially normal (AST 40).  Albumin slightly low at 3.2 prior to  hydration.   IMPRESSION:  1. Coffee-ground emesis without acute bleeding at the moment, as  evidenced by NG output.  2. Hypotension.  3. Chronic anemia.  4. Multiple medical problems including end-stage renal disease on      maintenance hemodialysis, Tuesdays, Thursdays, and Saturdays, plus      mental retardation, CHF, hypertension, hyperlipidemia.   PLAN:  Proceed to endoscopic evaluation, the nature, purpose and risks  of which have been reviewed with the patient's sister, [name]  ([phone], cell phone [phone]).  She is desirous of proceeding.  Further  management will depend on the endoscopic findings.           ______________________________  Bernette Redbird, M.D.     RB/MEDQ  D:  08/16/2008  T:  08/17/2008  Job:  [id number]

## 2011-03-04 NOTE — Assessment & Plan Note (Signed)
OFFICE VISIT   [name], FJELD  DOB:  01/24/1951                                       01/11/2010  AVWUJ#:[id number]   Patient underwent placement of a right upper arm AV Gore-Tex graft by  Dr. Myra Gianotti on 03/11.  He had difficulty with an acute steal and was  taken back to have removal of this and patch of his axillary artery by  Dr. Edilia Bo on 12/30/2009.  He is here today for follow-up.   He does have continued moderate swelling in his right arm.  He does not  have any evidence of ischemia with a well-perfused hand.  His incisions  are all healing quite nicely.   He is mentally retarded and does dialyze via right femoral catheter.  He  will continue the use of the catheter.  Would be a potential candidate  for a femoral loop graft.  He has very limited mobility and  understanding, and I think that femoral graft would pose its own risk as  well.  He will continue to follow up with [place] on an as-needed basis.     Larina Earthly, M.D.  Electronically Signed   TFE/MEDQ  D:  01/11/2010  T:  01/11/2010  Job:  [id number]   cc:   Terrial Rhodes, M.D.

## 2011-03-04 NOTE — Assessment & Plan Note (Signed)
OFFICE VISIT   [name], [name]  DOB:  Jan 06, 1951                                       06/26/2010  ZOXWR#:[id number]   I saw Mr. Lafavor in the office today with his family to discuss thigh  graft.  He recently had to have a catheter removed from the right groin  because it was not functioning well and I placed a 55 cm Diatek catheter  in the left groin.  We brought him back in to obtain ABIs to see if he  might be a candidate for a thigh graft.  His catheter has been  functioning well and according to the family, he has been doing well.  He is mentally retarded so no history can be obtained from the patient  himself.   PHYSICAL EXAMINATION:  His catheter site looks fine.  Blood pressure is  104/72, heart rate is 106, temperature is 97.8.  He has a palpable right  femoral pulse.  I cannot palpate pedal pulses.  We did obtain Doppler  study in the office today which showed triphasic Doppler signals in both  feet in the dorsalis pedis and posterior tibial positions.  He has an  ABI of 100% on the right and 97% on the left.   I have explained to the family that I do think he is a candidate for a  right thigh graft.  They are somewhat reluctant to proceed because they  are concerned about the pain associated with this and apparently in the  past he has had problems with them sticking him with needles for  dialysis.  If the family decides to proceed with surgery I would be  happy to schedule placement of a right thigh AV graft.     Di Kindle. Edilia Bo, M.D.  Electronically Signed   CSD/MEDQ  D:  06/26/2010  T:  06/27/2010  Job:  [id number]

## 2011-03-04 NOTE — Op Note (Signed)
[name], [name]                 ACCOUNT NO.:  [id number]   MEDICAL RECORD NO.:  [id number]          PATIENT TYPE:  AMB   LOCATION:  SDS                          FACILITY:  [facility]   PHYSICIAN:  Di Kindle. Edilia Bo, M.D.DATE OF BIRTH:  1951/10/15   DATE OF PROCEDURE:  03/13/2007  DATE OF DISCHARGE:                               OPERATIVE REPORT   PREOPERATIVE DIAGNOSIS:  Chronic renal failure.   POSTOPERATIVE DIAGNOSIS:  Chronic renal failure.   PROCEDURE:  Placement of right subclavian vein Diatek catheter.   SURGEON:  Di Kindle. Edilia Bo, M.D.   ASSISTANT:  Nurse.   ANESTHESIA:  General.   INDICATIONS:  This is a 60 year old gentleman who just had a left-sided  catheter removed for infection.  He had a antral handicap and would not  allow [place] to do this under local and had a general anesthetic.  I did  look at both internal jugular veins of the ultrasound, and neither IJ  could clearly be identified.  I therefore elected to place a right  subclavian vein catheter.   TECHNIQUE:  The patient was taken to the operating room, where he  received a general anesthetic.  The neck and upper chest were prepped  and draped in the usual sterile fashion.  After his right IJ was  cannulated, a guidewire was introduced into the right atrium.  An end-  hole catheter was passed over the wire and then the J-wire was exchanged  for a stiff Amplatz wire.  The tract over the Amplatz wire was dilated  and then the dilator and peel-away sheath were passed over the wire.  The dilator was then removed.  The catheter was threaded over the wire  through the peel-away sheath, down into the right atrium, and then the  peel-away sheath and wire were removed.  The exit site for the catheter  was selected and the catheter was brought through the tunnel.  The  catheter was cut to the appropriate length and the distal ports were  attached.  Both ports withdrew easily, were then flushed with  heparinized saline and filled with concentrated heparin.  The catheter  was secured at its exit site with a 3-0 nylon suture.  The IJ  cannulation site was closed with a 4-0 subcuticular stitch.  A sterile  dressing was applied.  The patient tolerated the procedure well, was  transferred to the recovery room in satisfactory condition.  All needle  and sponge counts were correct.      Di Kindle. Edilia Bo, M.D.  Electronically Signed     CSD/MEDQ  D:  03/13/2007  T:  03/13/2007  Job:  [id number]

## 2011-03-04 NOTE — Discharge Summary (Signed)
[name], [name] NO.:  [id number]   MEDICAL RECORD NO.:  [id number]          PATIENT TYPE:  INP   LOCATION:  5039                         FACILITY:  [facility]   PHYSICIAN:  Madaline Guthrie, M.D.    DATE OF BIRTH:  [birth year]   DATE OF ADMISSION:  08/16/2008  DATE OF DISCHARGE:  08/22/2008                               DISCHARGE SUMMARY   ATTENDING PHYSICIAN:  Madaline Guthrie, MD   DISCHARGE DIAGNOSES:  1. Acute upper gastrointestinal bleed secondary to 8-mm antral ulcer      status post epinephrine injection with resolution of bleeding.  2. Nonbleeding esophageal varices, diagnosed by upper endoscopy  3. Helicobacter pylori positive from antral ulcer biopsy on upper      endoscopy.  4. 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  5. Acute hemoglobin drop status post 2 units packed red blood cells      transfusion.  6. End-stage renal disease requiring dialysis 3 times a week.  7. Anemia of chronic disease.  8. Hypertension.  9. Mutism.  10.Cognitive delay.  11.Hyperlipidemia.  12.Hypertension.  13.Unspecified congestive heart failure.   DISCHARGE MEDICATIONS:  1. P.o. clarithromycin 500 mg daily x14 days. Prescription given.  2. P.o. Amoxicillin 1 g b.i.d. x14 days. Prescription given.  3. P.o. Protonix 40 mg b.i.d. Prescription given.  4. P.o. nadolol 20 mg on Tuesday, Thursday, and Saturday after      dialysis.  5. P.o. calcium 500 mg daily.  6. P.o. Lipitor 20 mg daily.  7. P.o. Epogen 3000 units/mL with dialysis.  8. Dialyvite supplement daily.  9. Toprol-XL 50 mg daily.  10.Tums with each meal.   Please note that the patient was instructed to discontinue his aspirin  325 mg daily.  The patient was also instructed not to take any other  NSAIDs secondary to the patient's  upper GI bleed.  Given that the  aspirin was likely for primary prevention of a coronary or  cerebrovascular event, it should be at the discretion of the [name] as  to when to restart the aspirin.   CONDITION AT DISCHARGE:  The patient's hemoglobin was stable at 11.6 up  from 8.6 the day before, more than the expected response from 2 units of  packed red blood cells.  There was no evidence of any residual upper GI  bleed.  The patient's facial swelling had improved significantly status  post angioplasty of the patient's SVC and left innominate.  The patient  was started on triple therapy for H. pylori positive biopsy from the  patient's antral ulcer.  The patient is to follow up with Dr. Noel Gerold in  the Outpatient Clinic on August 25, 2008, at 3:00 p.m.  The patient is  to continue with Dr. Arrie Aran of Nephrology for dialysis.  The patient  should also follow up with Dr. Matthias Hughs of Gastroenterology to reevaluate  the patient's upper GI bleed with possible repeat endoscopy.   PROCEDURES:  An upper endoscopy was done on the night of admission by  Dr. Bernette Redbird.  Please see the operative report.  The patient also  had a SVC venogram done on August 19, 2008, which showed stenoses of  the left innominate vein and SVC, status post 10-mm balloon angioplasty.  The patient also had a chest x-ray on August 21, 2008, to evaluate the  patient's dropping hemoglobin and the x-ray showed no acute  cardiopulmonary disease.   CONSULTATIONS:  1. Bernette Redbird, M.D. of Gastroenterology.  2. Thora Lance, M.D. of Radiology.  3. Terrial Rhodes, M.D. of Nephrology.   ADMISSION HISTORY AND PHYSICAL:  The patient is a 60 year old African  American male with past medical history of end-stage renal disease,  hypertension, mental retardation who presents with hematemesis.  The  history was provided by the family member.  Today, the patient had an  episode of witnessed coffee-ground emesis  with unspecified volume.  Prior to this, the patient had been in his normal state of health.  The  patient did not complain of abdominal pain, any melanotic stools or  hematochezia.  The patient did not complain of any nausea, any chest  pain, or shortness of breath.  Again, the patient has reduced cognitive  function.  The patient had been hospitalized in July 2009 for abdominal  pain with unclear etiology at the time of discharge. The patient/ family  had refused an EGD during that hospitilization. Per family, the patient  has never before had hematemesis.  The patient has 2 additional episodes  en route to the hospital.   PHYSICAL EXAMINATION:  VITAL SIGNS:  Temperature 97.5, blood pressure of  95/60 that came down to 74/? and then back up to 115/65 with volume  resuscitation, pulse of 136, respiratory rate of 20, and O2 sat 97% on  room air.  GENERAL:  Sedated.  Nasogastric tube in place.  HEENT:  Eyes, pupils equal, round, and reactive to light and  accommodation.  ENT, moist membranous mucosa.  NECK:  Supple with right subclavian excess for a Diatek catheter.  RESPIRATORY:  Clear to auscultation bilaterally.  CARDIOVASCULAR:  Tachycardic with a regular rhythm.  No murmurs, rubs,  or gallops.  GASTROINTESTINAL:  Soft, nontender, nondistended, positive normoactive  bowel sounds.  EXTREMITIES:  No edema.  GENITOURINARY:  Deferred.  MUSCULOSKELETAL:  No obvious deformities.  NEUROLOGIC:  Strength grossly intact.  The patient was resistant to any  kind of restraints and required Haldol and Ativan sedation in the ED.   LABORATORY:  Sodium of 146, potassium 5.6, chloride of 106, bicarbonate  25, BUN of 77, creatinine 12.7, and glucose of 118.  CBC, white blood  cell count of 8.7, hemoglobin of 10.2, platelets of 180, MCV of 89.3,  and RDW of 19.6.  LFTs, bilirubin 1.0, alk phos of 61, AST 40, ALT 26,  protein 6.4, and albumin 3.2.  Calcium 9.7.  INR of 1.1.  FOBT positive.  Chest  x-ray no acute findings.   HOSPITAL COURSE:  The patient presented with an acute upper GI bleed and  was aggressively volume resuscitated in the ED and had to be chemically  restrained with Haldol and Ativan in order to insert an NG tube.  When  the NG tube was inserted, dark blood returned and a significant volume  was returned with aggressive NG lavage.   1. Upper GI bleed:  The patient was  to stabilized in the ED and then      Dr. Matthias Hughs from GI performed an urgent endoscopy.  Please see the      endoscopy record for further details.  The patient had an 8-mm      antral ulcer that was likely the culprit for the hematemesis.      Epinephrine was injected, and the bleeding was stopped.  A biopsy      was obtained that was found to be H. pylori positive with no      evidence of malignancy or dysplasia.  Incidentally, esophageal      varices that were mild to moderate were found although these were      unlikely to be the cause of the patient's hematemesis.  Post EGD,      there was no evidence of further hematemesis, and the NG tube was      pulled the day after the endoscopy. Because of the patient being H.      pylori positive, he was discharged on triple therapy including      clarithromycin, amoxicillin, and Protonix.  The patient should stop      taking aspirin or any other NSAIDs.  Because the patient has      esophageal varices, he was started on nadolol 20 mg on Tuesday,      Thursday, and Saturday after dialysis.  After the patient's upper      endoscopy, his hemoglobin was stable until a day prior to discharge      and 4 days after the endoscopy.  At that time, his Hgb dropped      nearly 2 g/dL, and so he was transfused two units, with a more than      appropriate response.  2. Facial swelling secondary to SVC and left innominate vein stenosis      status post balloon angioplasty:  Per Dr. Arrie Aran, who had seen      the patient frequently for the patient's dialysis, Dr.  Arrie Aran      had noticed that the patient's face had become more and more      swollen.  As a result of this, a SVC venogram was ordered and      showed stenoses of the SVC and left innominate.  Balloon      angioplasty was performed successfully, and this reduced the      patient's facial swelling.  3. Esophageal varices:  The patient was found to have esophageal      varices on upper endoscopy.  The etiology of these is unknown, as      the patient does not have any history of alcoholism or liver      abnormalities.  He should continue on the beta blocker, nadolol,      for prophylaxis.  4. End-stage renal disease:  The patient was dialyzed twice in the      hospital on his usual schedule of Thursday and Saturday.  He should      continue with dialysis as an outpatient.  5. Hemoglobin drop of 1.8 g/dL from August 18, 2008, to August 19, 2008, of unknown etiology.  The patient has a history of anemia of      chronic disease, and his hemoglobin remained stable despite the      upper GI bleed in the 10-11 range.  However, his hemoglobin dropped      without any obvious source from August 18, 2008,  to August 19, 2008.  His hemoglobin remained low, and he was transfused on      August 20, 2008, and had a corresponding increase to hemoglobin of      11.2 on August 21, 2008.  He remained stable on August 22, 2008,      the date of discharge.   DISCHARGE LABORATORY AND VITAL SIGNS:  The patient's last set of vitals,  temperature 98.4, pulse of 110.  His blood pressure of 118/76,  saturations 97% on room air.  The patient's last CBC, hemoglobin of  11.6, hematocrit 35.1, white blood cell count 6.5, and platelet count of  202.  The patient's last renal functions, sodium 141,  potassium 4.8, chloride of 104, bicarbonate 22, glucose of 82, BUN of  72, creatinine of 15.3, calcium of 8.7, and phosphorus of 8.3.   PENDING LABORATORY:  There were no pending labs at this  time.      Linward Foster, MD  Electronically Signed      Madaline Guthrie, M.D.  Electronically Signed    LW/MEDQ  D:  08/23/2008  T:  08/24/2008  Job:  [id number]   cc:   Valetta Close, M.D.

## 2011-03-04 NOTE — Discharge Summary (Signed)
[name], [name]                 ACCOUNT NO.:  [id number]   MEDICAL RECORD NO.:  [id number]          PATIENT TYPE:  INP   LOCATION:  6734                         FACILITY:  [facility]   PHYSICIAN:  Madaline Guthrie, M.D.    DATE OF BIRTH:  [birth year]   DATE OF ADMISSION:  05/04/2009  DATE OF DISCHARGE:  05/09/2009                               DISCHARGE SUMMARY   DISCHARGE DIAGNOSES:  1. Hypotension, resolved.  2. Myoclonus/shaking.  3. End-stage renal disease on hemodialysis.  4. Hyperlipidemia.  5. Hypertension.  6. History of esophageal varices.  7. History of iron deficiency anemia.  8. History of superior vena cava syndrome.  9. History of mental retardation.  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  11.History of secondary hyperparathyroidism.  12.History of complex cyst on the right kidney.   DISCHARGE MEDICATIONS:  1. Dialyvite tabs, please take 1 tablet by mouth once a day.  2. Calcium 500 mg tablets, please take 1 tablet by mouth once a day.  3. Lipitor 20 mg tablets, please take 1 tablet by mouth once a day.  4. Epogen 3000 units per mL solution, please take 3400 units IV 3      times a week with dialysis.  5. Protonix 40 mg tablets, please take 1 tablet 2 times a day.  6. Calcium carbonate 1000 mg, please take 1 tablet by mouth with each      meal.   Please make note that the patient was taking nadolol prior to admission  to the hospital.  This medication was held upon discharge from the  hospital.   DISPOSITION AND FOLLOWUP:  The patient was discharged home under the  care of his caretaker in stable condition.  The patient is to follow up  at the [facility] on May 23, 2009, at 3 p.m. with  Dr. Sunnie Nielsen.  At this visit, the patient's blood pressure needs to be  checked.  If appropriate, his nadolol might need to be restarted.  This  was held at hospital discharge due to admission for hypotension  requiring admission in the hospital.  The patient is also to resume his  outpatient hemodialysis scheduled.   PROCEDURES PERFORMED:  1. EEG.  2. 2-D echo.  3. Left upper arm, AV shuntogram.  4. CT head without contrast.   Details of these procedures can be found in the hospital course below.   CONSULTATIONS:  Nephrology.   BRIEF ADMITTING HISTORY PHYSICAL:  [name] is a 60 year old male with  a past medical history of mental retardation, mutism, end-stage renal  disease on hemodialysis and iron deficiency anemia who presented to the  [facility] with his caregiver.  The patient's  caregiver reported that 4 times over the course of the 2 weeks prior to  admission, the patient had shaking or seizure-like movements.  These  occurred when he was standing up, twice when he brushing his teeth, they  would last for 2-3 minutes and were not followed by any postictal phase.  The patient did not fall, lose bowel or bladder function and returned  immediately to his baseline  after these episodes.  The patient's  caregiver was also told at his dialysis session that his blood pressure  had been low on the last few dialysis sessions prior to admission and  that the patient should not take his nadolol that he was taking for  history of esophageal varices.  When the patient presented to the [facility], he was found to have a blood pressure systolic  in the 16X and was admitted to [facility].   PHYSICAL EXAMINATION:  On admission, at the time of evaluation by the  Internal Medicine Teaching Service.  VITAL SIGNS:  Temperature 97.1 degrees Fahrenheit, pulse 78, blood  pressure 118/50, respirations 16, and oxygen saturation 97% on room air.  The patient exhibited no abnormalities including on neuro exam.  The  patient's left upper extremity AV graft did have a palpable thrill.   ADMITTING LABORATORY DATA:  White blood count 4.8, hemoglobin 13.7,  hematocrit 41, and platelets 125.  Sodium 140, potassium 4.1, chloride  94,  CO2 32, glucose 98, BUN 16, creatinine 8.91, total bilirubin 0.8,  alkaline phosphatase 147, AST 31, ALT 27, total protein 8.9, albumin  blood 4.3, calcium 10.1, PT/INR is 1.0, CK-MB 0.7, troponin-I 0.02,  magnesium 2.4, phosphorus 4.0, random p.m. cortisol 9.5, ammonia 30, and  TSH 4.4.   HOSPITAL COURSE BY PROBLEM:  1. Hypotension.  The patient was admitted to the Internal Medicine      Teaching Service at [facility].  Cardiac enzymes were      checked and were negative x3.  EKG showed no signs of ischemia.      The IV team worked with the patient to obtain IV access.  After      several attempts, this was unsuccessful.  At this time, the      patient's blood pressure had increased without intervention to the      110s systolic, which is his baseline.  The patient was tolerating      p.o. food and fluids fine.  He was encouraged to hydrate by mouth.      The Renal Team from [facility] saw the patient and      felt that it was not necessary to try to obtain better access for      fluid administration such as with a central line.  The patient did      have one set of blood cultures drawn, which showed no growth at 5      days.  The patient did have an ACTH stimulation test performed and      showed a cortisol level of 15.8, 60 minutes post stimulation.      Although the patient's 60 minutes post-ACTH stimulation test      cortisol level was 15.8, not quiet at threshold, it is felt that      this patient is not clearly adrenally insufficient.  TSH was within      normal limits.  As no obvious sources with this patient's      hypotension could be found in the hospital, it was presumed that      this patient had hypovolemia secondary to overfiltration during his      dialysis sessions. This patient's blood pressure remained stable in      the hospital and he was discharged without further episodes of      hypotension.  2. Myoclonus/shaking movements.  This  patient's caregiver reported  that the few weeks leading up to discharge, the patient had 4      episodes of shaking movements that occurred when he was standing up      and were not associated with any postictal phase, each episode      lasted 2-3 minutes at most.  The patient did have an EEG performed      on May 07, 2009, that was read as an essentially normal EEG in the      waking state.  The patient did have a CT of the head without      contrast, which showed no acute intracranial abnormalities.  As      this patient did not actually have any of these brief shaking      spells in the hospital, it is unclear what could be their etiology      as EEG and CT head revealed no abnormalities.  No further workup      was pursued.  It should be noted that the patient had no      electrolyte abnormalities that would explain such episodes.  3. End-stage renal disease on hemodialysis.  [facility] did consult on this patient to resume 3-day per week      hemodialysis on an inpatient basis.  While the patient was in the      hospital, he did have a left upper arm AV shuntogram performed on      May 08, 2009.  This showed that the left upper arm AV graft was      patent with no peripheral venous outflow stenosis.  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  It was deemed that since the      patient's left upper extremity AV graft was functional, no further      intervention was needed.  4. Hyperlipidemia.  The patient was treated with simvastatin.  5. Hypertension.  The patient's home antihypertensive were held due to      the hospital admission for hypotension.  6. History of esophageal varices.  The patient was taking nadolol at      home in the setting of history of esophageal varices.  Nadolol was      held during this  hospitalization given his admission for      hypotension.   DISCHARGE VITAL SIGNS:  Temperature 97.4 Fahrenheit, pulse 91,  respirations 20, blood pressure 104/68, and oxygen saturation 99% on  room air.   LABORATORY DATA FROM THE DAY PRIOR TO DISCHARGE:  White blood cell count  6.4, hemoglobin 12.5, hematocrit 36.6, and platelet count 137.  Sodium  136, potassium 4.5, chloride 95, CO2 25, glucose 184, BUN 64, creatinine  0.95, albumin blood 3.4, calcium 9.7, and phosphorus 3.6.      Aris Lot, MD  Electronically Signed      Madaline Guthrie, M.D.  Electronically Signed    WW/MEDQ  D:  05/10/2009  T:  05/11/2009  Job:  [id number]   cc:   [facility]

## 2011-03-07 NOTE — Discharge Summary (Signed)
NAMELEGACY, LACIVITA                 ACCOUNT NO.:  [id number]   MEDICAL RECORD NO.:  [id number]          PATIENT TYPE:  INP   LOCATION:  5501                         FACILITY:  [facility]   PHYSICIAN:  Ileana Roup, M.D.  DATE OF BIRTH:  04/18/1951   DATE OF ADMISSION:  05/17/2008  DATE OF DISCHARGE:  05/20/2008                               DISCHARGE SUMMARY   DISCHARGE DIAGNOSES:  1. Abdominal pain, etiology unclear.  2. End-stage renal disease.  3. Abnormal EKG.  4. Anemia of chronic disease.  5. Hypertension.   DISCHARGE MEDICATIONS:  1. Aspirin 325 mg p.o. daily.  2. Lipitor 20 mg p.o. nightly.  3. Calcium carbonate 500 mg one p.o. t.i.d. with meals.  4. Aranesp 60 mcg IV every Thursday with hemodialysis.  5. Iron sucrose complex 100 mg IV q. HD.  6. Toprol-XL 50 mg p.o. nightly.  7. Nepro 237 mL b.i.d.  8. Nephro-Vite one p.o. daily.   DISPOSITION AND FOLLOWUP:  I have called the Outpatient Clinic and asked  them to call the patient with a followup appointment in 7-10 days.  I  have instructed the caregiver if the clinic did not call her in a couple  of days to call them and arrange for an appointment.  The patient's  abdominal pain should be reassessed.  We will not pursue any labs  because the patient is very afraid of needles and would not allow it.  I  have arranged per Renal to draw labs while doing hemodialysis.   PROCEDURES PERFORMED AND STUDIES:  1. The patient had acute abdominal series with CXR done on May 17, 2008.  Impression, nonobstructive bowel gas pattern.  Large amount      of stool in the colon. Pulmonary vascular congestion without edema.  2. The patient had a CT of the abdomen and pelvis done on May 18, 2008.  CT abdomen; impression: scattered colonic diverticulosis.      No acute findings in the abdomen.  Cardiomegaly, vascular      congestion.  CT pelvis; impression: no acute findings in the      pelvis.  Appendix is normal.   <CONSULTATION>   Nephrology was consulted to manage hemodialysis.   PRIMARY CARE PHYSICIAN:  Dr. Maple Hudson in the Outpatient Clinic.   HISTORY OF PRESENT ILLNESS:  The patient is a 60 year old male with past  medical history significant for ESRD, hypertension, mental retardation,  and mutism who comes in complaining of chest pain/abdominal pain, which  started on the afternoon of admission.  The patient is mute, and the EMS  was called by caregiver because the patient never complains of any type  of pain, but bent over and pointed at his lower chest and abdominal  region and indicated that he is having pain.  Cannot assess pain because  the patient cannot communicate.  Per the caregiver, there was no  diaphoresis, fevers, or vomiting.   For allergies, past medical history, past surgical history, medications,  substance history, social history, and family history, please see  hospital chart.   PHYSICAL EXAMINATION:  VITAL SIGNS:  Temperature is 97.1, blood pressure  161/69, pulse 115, respiratory rate 22, and O2 sats 100% on room air.  GENERAL:  Mute, in no acute distress.  EYES:  Anicteric.  No pallor or exophthalmos.  ENT:  Could not evaluate.  NECK:  Supple.  No JVD.  RESPIRATORY:  Good air movement, coarse breath sounds, and Diatek  catheter on the left side.  CV:  The patient has pulmonic area systolic ejection murmur, S1 and S2,  tachycardic, and regular.  GI:  Positive bowel sounds, nontender, and nondistended.  No guarding  and no rebound.  EXTREMITIES:  No edema.  SKIN:  No jaundice and no abnormal lesions.  LYMPH:  No LAD.  NEURO:  Grossly nonfocal.   ADMISSION LABORATORY DATA:  Sodium 144, potassium 4.0, chloride 100,  bicarb 25, BUN 30, creatinine 11.9, glucose 114, bilirubin 0.7, alk phos  72, AST 23, ALT 16, total protein 7.8, albumin 3.5, calcium 9.8, WBC  9.3, hemoglobin 11.8, platelets 264, ANC 5.9, RDW 16.6, MCV 86.1, and  lipase equals 40.  Abdominal film and CT  scans were also done with above-  mentioned results.  EKG showed T-wave inversions in III and aVF and T-  wave flattening in V3 through V6.   HOSPITAL COURSE:  1. Abdominal pain.  The etiology of patient's abdominal pain is      unknown at this time.  The patient had an acute abdominal series as      well as CT abdomen and pelvis with the only significant findings      being diverticulosis without significant signs of diverticulitis.      The patient's abdominal pain quickly resolved.  Of note, the      patient's hemoglobin dropped from 11.8 on admission to 9.8 on the      second hospital day.  FOBT was negative.  Hemoglobin was rechecked      at dialysis prior to discharge and had stabilized at 10.6.  Due to      the patient being very afraid of needles, CBCs will need to be      monitored when the patient undergoes hemodialysis.  The patient's      workup for his abdominal pain was essentially negative.  At      discharge, his abdomen was nontender and he showed no signs of pain      or discomfort.  This will need to be followed up in the outpatient      setting.  2. Chronic kidney disease, on hemodialysis.  Nephrology was consulted      to manage the patient's hemodialysis.  3. Abnormal EKG.  Cardiac enzymes were negative.  Per the patient's      sister and caregiver, further workup of EKG changes was not      undertaken per family's request.  The patient does not tolerate any      invasive procedures and so this will not be further worked up.  4. Anemia of chronic disease.  As mentioned above, the patient's      hemoglobin did decrease slightly but remained stable at the time of      discharge.  There were no active signs of bleeding and CT scans did      not show any evidence of hematoma anywhere.  This will need to be      monitored in hemodialysis, as the patient will not allow  needlesticks.  5. Hypertension.  Good control.  No change to regimen.   DISCHARGE LABORATORY  DATA:  Sodium 137, potassium 4.1, chloride 97,  bicarb 27, glucose 87, BUN 40, creatinine 12.25, albumin 2.9, calcium  9.2, phosphorus 5.8, WBC 7.2, hemoglobin 10.6, hematocrit 33.1, MCV  84.6, and platelets 280.  Significant labs during hospital stay include  lipase equals 46, cardiac enzymes negative x3, RPR nonreactive, TSH  3.777, and B12 equals 536.   Discharge vitals include temperature 98.1, pulse 91, respiratory rate  16, blood pressure 113/73, and O2 sats 96% on room air.      Rufina Falco, M.D.  Electronically Signed      Ileana Roup, M.D.  Electronically Signed    JY/MEDQ  D:  05/26/2008  T:  05/26/2008  Job:  [id number]   cc:   Dr. Maple Hudson

## 2011-03-07 NOTE — Op Note (Signed)
[name], [name]                 ACCOUNT NO.:  [id number]   MEDICAL RECORD NO.:  [id number]          PATIENT TYPE:  INP   LOCATION:  5741                         FACILITY:  [facility]   PHYSICIAN:  Larina Earthly, M.D.    DATE OF BIRTH:  02/13/1951   DATE OF PROCEDURE:  07/06/2004  DATE OF DISCHARGE:                                 OPERATIVE REPORT   PREOPERATIVE DIAGNOSIS:  End-stage renal disease.   POSTOPERATIVE DIAGNOSIS:  End-stage renal disease.   PROCEDURE:  Placement of right IJ Diatek catheter following bilateral  jugular vein ultrasound visualization.   SURGEON:  Dr. Tawanna Cooler Early.   ASSISTANT:  Nurse.   ANESTHESIA:  LMA.   COMPLICATIONS:  None.   DISPOSITION:  To recovery room stable.   PROCEDURE IN DETAIL:  The patient was taken to the operating room, placed  __________.  The right and left neck and chest were prepped and draped in  the usual sterile fashion.  Ultrasound visualization revealed patent jugular  veins bilaterally.  Using local anesthesia with the patient in Trendelenburg  position, the right internal jugular vein was easily identified.  Next,  using the Seldinger technique a guidewire was passed down to the level of  the right atrium.  This was confirmed with fluoroscopy.  The dilator and  peel-away sheath was passed over the guidewire and the dilator and guidewire  were removed.  The 28 cm catheter was passed down the peel-away sheath which  was then removed as well.  The catheter was brought through a subcutaneous  tunnel through a separate stab incision and the two lumen ports were  attached.  Both lumens flushed and aspirated easily and were locked with  1000 U/mL heparin.  The catheter was secured at the skin with 3-0 nylon  stitch and the entry site was closed with a 4-0 subcuticular Vicryl stitch.  The catheter was locked with 1000 U/mL heparin.  The catheter was dressed in  the usual sterile fashion, and was transferred to the recovery room where  a  chest x-ray is pending.      Todd   TFE/MEDQ  D:  07/06/2004  T:  07/07/2004  Job:  [id number]

## 2011-03-07 NOTE — Consult Note (Signed)
NAME:  [name], [name] NO.:  [id number]   MEDICAL RECORD NO.:  [id number]                   PATIENT TYPE:  OUT   LOCATION:  OLAB                                 FACILITY:  [facility]   PHYSICIAN:  Maree Krabbe, M.D.             DATE OF BIRTH:  05-01-1951   DATE OF CONSULTATION:  07/05/2004  DATE OF DISCHARGE:  07/05/2004                                   CONSULTATION   PRIMARY CARE PHYSICIAN:  Alvester Morin, M.D.   IMPRESSION:  1.  Severe and progressive renal failure with end-stage renal disease.  2.  Uremia, volume overload.  Unclear underlying etiology, but hypertension      may have played a role.  3.  Severe and chronic intellectual impairment.  4.  Chronic mutism.  5.  Hypertension uncertain duration.  6.  Volume overload with significant edema and mild congestive heart      failure.  7.  Anemia probably due to chronic renal failure in part.   RECOMMENDATIONS:  1.  Would discuss with the family as to the pros and cons of undertaking      longterm dialysis in this particular situation.  The patient's      functional status is reasonable, but he has severe intellectual      impairment and there will be issues regarding communication during      dialysis treatments, pain recognition, and possibly others.  He may be a      good candidate for peritoneal dialysis if he has caretaker who can do      this.  If  dialysis is to be done, we will plan on asking a surgeon to      place a catheter tomorrow and initiate dialysis tomorrow.  He will need      significant volume removal.  Would check PTH and iron saturation and do      initiate treatment for anemia and secondary hyperparathyroidism as      indicated.  2.  He should also be screened for multiple myeloma.   HISTORY OF PRESENT ILLNESS:  The patient is a 60 year old black male with  chronic and severe intellectual impairment and mutism.  He has also had  longstanding hypertension and  progressive renal failure.  His creatinine was  1.8 in 2003, 3.0 in 2004, 4.0 in February of this year, 5.0 in May of this  year, and 13.1 today.  He recently was diagnosed with right leg cellulitis  and was seen in follow-up visit today in the medicine clinic and labs showed  a creatinine of 13.1.  The patient cannot provide any history and there is  no family available currently.   PAST MEDICAL HISTORY:  Hypertension, hyperlipidemia, mental retardation,  mutism, chronic renal failure.   ALLERGIES:  No known drug allergies.   MEDICATIONS:  1.  Metoprolol 100 daily.  2.  Norvasc 10 daily.  3.  Lasix 20 daily.  4.  Lipitor 10 daily.  5.  Aspirin 325 daily.  6.  Lactulose p.r.n.  7.  MDI daily.   SOCIAL HISTORY:  Substance history; none.  The patient is single, lives with  a caregiver in [place].  He works in some type of work shop.   FAMILY HISTORY:  No history of renal disease.   REVIEW OF SYSTEMS:  Unavailable.   PHYSICAL EXAMINATION:  VITAL SIGNS:  Pulse 80, respirations 120/80,  temperature 96.7.  GENERAL:  This is a well-developed, well-nourished, black male in no acute  distress.  SKIN:  Without rash.  HEENT:  PERRL.  EOMI.  Throat is clear.  NECK:  Supple with significantly distended neck veins.  CHEST:  Bibasilar soft inspiratory rales.  HEART:  Regular rate and rhythm without murmurs, rubs, or gallops.  ABDOMEN:  Soft and nontender without organomegaly.  He has some bowel  anterior abdominal wall edema.  EXTREMITIES:  He has 3+ pitting edema in both legs.  GENITOURINARY:  Deferred.  NEUROLOGY:  He moves all four extremities and seems to respond to some  commands.  He sat himself up when I pulled out my stethoscope.   LABORATORY DATA:  Sodium 141, potassium 4.2, sodium 132, potassium 13.1, CO2  22, glucose 111, bilirubin 0.3, liver enzymes normal, albumin 2.8, calcium  8.4, protein 7.6.  Urinalysis negative, moderate blood, and 100 mg/dl of  protein, pH  5.0.      RDS/MEDQ  D:  07/05/2004  T:  07/08/2004  Job:  [id number]   cc:   Alvester Morin, M.D.  [address]  [place]  [place] [address]  Fax: [phone]

## 2011-03-07 NOTE — Op Note (Signed)
[name], [name]                 ACCOUNT NO.:  [id number]   MEDICAL RECORD NO.:  [id number]          PATIENT TYPE:  INP   LOCATION:  5741                         FACILITY:  [facility]   PHYSICIAN:  Janetta Hora. Fields, MD  DATE OF BIRTH:  03/19/1951   DATE OF PROCEDURE:  07/09/2004  DATE OF DISCHARGE:                                 OPERATIVE REPORT   PROCEDURE:  Creation of left radiocephalic fistula.   PREOPERATIVE DIAGNOSIS:  End-stage renal disease.   POSTOPERATIVE DIAGNOSIS:  End-stage renal disease.   ANESTHESIA:  Local with IV sedation.   ANESTHESIA:  Pecola Leisure, P.A.   OPERATIVE DETAILS:  After obtaining informed consent, the patient was taken  to the operating room. The patient was placed in a supine position in the  operating table. The patient left upper extremity was prepped and draped in  the usual sterile fashion. Local anesthesia was infiltrated over the  cephalic vein which was on the dorsal aspect of the left wrist. Incision was  made in this location and carried down through the subcutaneous tissues.  Cephalic vein was dissected free circumferentially. Side branches were  ligated and divided between silk ties. Next, attention was turned to  dissection of the radial artery. Local anesthesia was infiltrated directly  over this or more on the medial aspect of the forearm or on the volar aspect  of the forearm. Longitudinal incision was made, and the radial artery was  dissected free circumferentially after dividing subcutaneous tissues and  controlled proximal and distal vessel loops. Next, the patient was given  3,000 units of intravenous heparin. The distal cephalic vein was ligated,  and a clip was placed on the distal cephalic vein. The vein was then dilated  up with heparinized saline. It was approximately 3 mm in diameter. The vein  was then swung underneath the skin bridge after marking it for orientation  to prevent twisting. Vessel loops were then pulled  taut on the artery. A  longitudinal arteriotomy was made, and the vein was cut to length and sewn  to the artery end of vein  to side of artery using running 7-0 Prolene  suture. Just prior to completion of the anastomosis, this was _________ back  bled and thoroughly flushed. At the completion of the anastomosis, there was  a palpable pulse within the fistula. There was noted to be a mild kinking of  the distal segment of this. This was tacked down with 7-0 Prolene and flow  improved substantially. There was good Doppler flow all the way up to the  level of the elbow after this point. Hemostasis was obtained. The  subcutaneous tissues of both incisions were then closed with 3-0 Vicryl  suture. The skin of both incisions were then closed with a 4-0 Vicryl  subcuticular stitch. The patient tolerated the procedure well, and there  were no complications. Instrument, sponge, and needle count was correct at  the end of the case. The patient was taken to the recovery room in stable  condition.      CEF/MEDQ  D:  07/09/2004  T:  07/10/2004  Job:  [id number]

## 2011-03-07 NOTE — Op Note (Signed)
[place]  Patient:    [name], [name]                        MRN: [id number] Proc. Date: 05/29/00 Adm. Date:  [id number] Attending:  Juluis Pitch Hincken                           Operative Report  OPERATIVE PROCEDURE:  The patient was brought into the operating room in the supine position and was intubated with a nasal tube.  No local anesthesia was required.  Full map scaling and root planing was done with a Cavitron and hand instruments.  Heavy bleeding and subgingival calculus was present.  Several of the posterior teeth were mobile.  There were no overall complications and oral and written postoperative instructions were given to the patients care giver. DD:  05/29/00 TD:  05/30/00 Job: [id number]

## 2011-03-07 NOTE — Discharge Summary (Signed)
NAMERUTHVIK, BARNABY NO.:  [id number]   MEDICAL RECORD NO.:  [id number]          PATIENT TYPE:  INP   LOCATION:  5741                         FACILITY:  [facility]   PHYSICIAN:  Alvester Morin, M.D.  DATE OF BIRTH:  [birth year]   DATE OF ADMISSION:  07/05/2004  DATE OF DISCHARGE:  07/12/2004                                 DISCHARGE SUMMARY   ADMISSION DIAGNOSES:  1.  Renal failure.  2.  Cellulitis of the right leg.  3.  Anemia of chronic disease.  4.  Hypertension.  5.  Mental retardation and mutism.   DISCHARGE DIAGNOSES:  1.  New end-stage renal disease now on chronic hemodialysis.  2.  Hypertension.  3.  Mental retardation.  4.  Mutism.  5.  Volume overload resolving with dialysis.  6.  Iron-deficiency anemia.  7.  Secondary hyperparathyroidism.  8.  Status post right IJ Diatek catheter, Dr. Gretta Began, July 06, 2004.  9.  Status post left radial AV fistula, July 09, 2004, Dr. Darrick Penna.  10. Cellulitis right leg, improving on antibiotics.  11. Complex cyst right kidney for followup MRI as outpatient.  12. Positive ANA speckled pattern, titer 1:160.   BRIEF HISTORY:  A 60 year old black male with chronic renal failure of  unclear etiology, creatinine 1.27 May 2002, 3.0 December 2004, 4.0  February 2005, 5.0 April 2005, 13.1 July 05, 2004.  Is brought to the  emergency room now due to right leg pain, edema, redness.  He has past  history of severe intellectual impairment, mutism since birth, hypertension  and is followed in the clinic by Dr. Zetta Bills.  Due to his acute renal  failure found on admission labs, he is admitted by teaching service.   LABS ON ADMISSION:  White count 10,200, hemoglobin 10.6, hematocrit 31.1,  platelets 343,000, sodium 145, potassium 4.4, chloride 113, Co2 21, glucose  111, BUN 130, creatinine 12.6, magnesium 2.7, calcium 8.6, albumin 2.7,  phosphorus 7.4, ammonia 13.   HOSPITAL COURSE:  1.  Renal  failure.  Upon admission a nephrology consult was obtained.  Dr.      Arlean Hopping saw the patient on day of admission.  Felt that patient had      progressed to probable end-stage renal disease and doubted an acute or a      reversible component.  Nevertheless, workup included an ultrasound of      the kidney showing small echogenic kidneys measuring 8.7 cm on the left      and 7.3 cm on the right.  Additionally, he had bilateral renal cyst with      complex cystic lesion seen in the upper pole of the right kidney      measuring 2.3 x 2.1 cm compared to 2.0 and 1.9 cm previously seen.      There were thick septations noted within the cystic lesion and a slight      increase in size since prior study.  Cystic neoplasm was of concern and      a followup MRI was suggested  and will be done as an outpatient.  There      was no hydronephrosis seen and the bladder was unremarkable.  Serum      protein electrophoresis was negative.  C3, C4 compliments were within      normal range.  Fractional excretion of sodium was greater than 1 on      Furosemide.  Urine was negative eosinophilia.  Urinalysis showed 3-6 red      blood cells per high powered field with granular and waxy casts seen      along with proteinuria at 100 mg/dL.  ANA titer was positive at 1:60      with speckled pattern.  On September 17, Dr. Arbie Cookey placed a right IJ      Diatek catheter successfully and without pneumothorax.  He underwent his      first dialysis on the 17th and then had three consecutive dialysis.  The      patient was grossly volume overload with significant peripheral edema.      However, his blood pressure was limiting aggressive filtration with      systolic pressures into the 70s while on dialysis.  He had five dialysis      during this hospitalization.  Post weight on the last treatment was 58.7      kg.  His blood pressure was approximately 100/70.  Antihypertensive      medications were tapered down. Diuretics were  stopped.  Outpatient      arrangements were made for [facility].  Dr. Darrick Penna      created a left radial __________fistula on September 20.  The patient's      sister received renal dietary education.  Arrangements were made at the      [facility] every Tuesday, Thursday, Saturday second      shift.  He was discharged to home.  We will continue to take his weight      down at the kidney center until he is euvolemic.  2.  Anemia.  The patient was transfused two units of packed red blood cells      this hospitalization.  EPO was started.  Iron studies showed a      transferrin saturation level of 45%.  Ferritin was at 252.  He will      receive a weekly dose of InFeD at 100 mg a week.  B12 and folate levels      were elevated at 1067 and 805 respectively.  3.  Secondary hyperparathyroidism.  Intact PTH level returned at 339.      Phosphate binders were used and his phosphorus was approximately 7 at      time of discharge.  Calcitriol was started for his elevated PTH.  4.  Right leg cellulitis.  The patient received antibiotics during the      course of his hospitalization.  No DVT was seen on  ultrasound of the      right leg.  His leg improved considerably and it should continue to      improve with decreasing edema with fluid removal on dialysis.  He will      continue getting Ancef at the kidney center 2 g IV each dialysis x3 more      doses.   DISCHARGE MEDICATIONS:  1.  Nephro-Vite vitamin one daily.  2.  Toprol XL 25 mg q.h.s.  3.  Lipitor 10 mg daily.  4.  Coated aspirin 325 mg daily.  5.  EPO 10,000 units IV each dialysis.  6.  Calcium carbonate 500 mg one with each meal.  7.  PhosLo 667 mg two with each meal.  8.  InFeD 100 mg IV once weekly in dialysis.  9.  Hectorol 2 mcg IV each dialysis.   Dry weight to be determined.   OUTPATIENT FOLLOWUP:  MRI of right kidney to be arranged.  Dialysis every Tuesday, Thursday, Saturday at the  [facility] second  shift.       RRK/MEDQ  D:  08/24/2004  T:  08/25/2004  Job:  [id number]

## 2011-03-07 NOTE — Op Note (Signed)
NAMEBRANDEN, SHALLENBERGER NO.:  [id number]   MEDICAL RECORD NO.:  [id number]          PATIENT TYPE:  OIB   LOCATION:  2899                         FACILITY:  [facility]   PHYSICIAN:  Quita Skye. Hart Rochester, M.D.  DATE OF BIRTH:  [birth year]   DATE OF PROCEDURE:  01/24/2005  DATE OF DISCHARGE:                                 OPERATIVE REPORT   PREOPERATIVE DIAGNOSIS:  Poorly functioning arteriovenous fistula left  forearm with end stage renal disease.   POSTOPERATIVE DIAGNOSIS:  Poorly functioning arteriovenous fistula left  forearm with end stage renal disease.   OPERATION:  1.  Ligation of left radial artery to cephalic vein arteriovenous fistula.  2.  Exploration of antecubital fossa including brachial artery, basilic      vein, and cephalic vein - veins inadequate.  3.  Insertion of a left upper arm arteriovenous Gore-Tex graft (6 mm)      brachial artery to axillary vein.   SURGEON:  Quita Skye. Hart Rochester, M.D.   FIRST ASSISTANT:  Claudette TNils Flack, N.P.   ANESTHESIA:  Local.   PROCEDURE:  The patient was taken to the operating room and placed in the  supine position at which time the left upper extremity was prepped with  Betadine scrubbing solution and draped in routine sterile manner.  After  infiltration of 1% Xylocaine with epinephrine, a short longitudinal incision  was made through the previous scar just proximal to the wrist and the radial  artery to cephalic vein fistula anastomosis was exposed proximally and  distally.  The fistula had not matured adequately and the vein was ligated  at its anastomosis to the artery with 2-0 silk tie being careful to preserve  normal arterial flow to the distal artery.  Following this, the wound was  closed with Vicryl in a subcuticular fashion.   Attention was turned to the antecubital area where a transverse incision was  made after infiltration with Xylocaine.  The brachial artery was in two  separate branches, both of which  were relatively small.  The cephalic vein  was not present.  The basilic vein was quite small and the brachial vein was  no more than 2-3 mm.  Therefore, another incision was made in the distal  upper arm to look for an adequate vein for outflow and the brachial veins in  this area were also quite small, not adequate for a fistula.  It was  decided, then, to insert an upper arm graft.  Therefore, the brachial artery  was exposed in this incision in the distal upper arm and encircled with  vessel loops.  A second incision was made just distal to the axilla and the  axillary vein was an adequate vein being 5 mm in size.  A tunnel was made on  the anterior aspect the arm in a curvilinear fashion after infiltration with  1% Xylocaine and a 6 by 20 mm Gore-Tex graft delivered through the tunnel  and 3000 units of heparin given intravenously.  The artery was occluded  proximally and distally with vessel loops, opened with a 15  blade, extended  with the Potts scissors.  The 6 mm graft was anastomosed end-to-side with 6-  0 Prolene.  Following this, the axillary vein was ligated distally and  transected and the graft was then anastomosed end-to-end of the axillary  vein with 6-0 Prolene.  The clamps were then released and there was a good  pulse and thrill in the graft and audible radial and ulnar flow in the wrist  with the  graft open.  Protamine was not given.  The wounds were irrigated with saline  and closed in layers with Vicryl in subcuticular fashion.  A sterile  dressing was applied and the patient taken to the recovery room in  satisfactory condition.      JDL/MEDQ  D:  01/24/2005  T:  01/24/2005  Job:  [id number]

## 2011-03-07 NOTE — Op Note (Signed)
[name], [name]                 ACCOUNT NO.:  [id number]   MEDICAL RECORD NO.:  [id number]          PATIENT TYPE:  OIB   LOCATION:  2899                         FACILITY:  [facility]   PHYSICIAN:  Larina Earthly, M.D.    DATE OF BIRTH:  12-Jun-1951   DATE OF PROCEDURE:  DATE OF DISCHARGE:                                 OPERATIVE REPORT   PREOPERATIVE DIAGNOSIS:  End-stage renal disease with poorly functioning  left wrist Cimino arteriovenous fistula.   POSTOPERATIVE DIAGNOSIS:  End-stage renal disease with poorly functioning  left wrist Cimino arteriovenous fistula.   PROCEDURE:  Ligation competing branch near the arteriovenous anastomosis of  the left wrist Cimino fistula.   SURGEON:  Dr. Tawanna Cooler Early.   ASSISTANT:  Nurse.   ANESTHESIA:  MAC.   COMPLICATIONS:  None.   DISPOSITION:  To the recovery room stable.   PROCEDURE IN DETAIL:  The patient was taken to the operating room and placed  in the supine position where the area of the left arm and left wrist were  prepped and draped in the usual sterile fashion.  The patient had a  preoperative sonogram which showed a competing branch near the arteriovenous  anastomosis of the AV fistula.  The incision was made over this level after  it had been isolated with hand-held Doppler.  The incision was continued  down to the area of the cephalic vein AV fistula and the tributary branch  was identified, was ligated with a 3-0 silk tie.  This did have augmentation  of flow through the cephalic vein fistula by Doppler.  The wound was  irrigated with saline.  Hemostasis was electrocautery.  The wounds were  closed with 3-0 Vicryl in the subcutaneous and subcuticular tissue.  Benzoin  and Steri-Strips were applied.      TFE/MEDQ  D:  11/25/2004  T:  11/25/2004  Job:  [id number]

## 2011-03-07 NOTE — Op Note (Signed)
[name], [name]                 ACCOUNT NO.:  [id number]   MEDICAL RECORD NO.:  [id number]          PATIENT TYPE:  OIB   LOCATION:  2899                         FACILITY:  [facility]   PHYSICIAN:  Larina Earthly, M.D.    DATE OF BIRTH:  07-16-1951   DATE OF PROCEDURE:  03/19/2005  DATE OF DISCHARGE:                                 OPERATIVE REPORT   PREOPERATIVE DIAGNOSIS:  End-stage renal disease.   POSTOPERATIVE DIAGNOSIS:  End-stage renal disease.   PROCEDURE:  Placement of left IJ Diatek catheter with ultrasound  visualization.   SURGEON:  Dr. Tawanna Cooler Early.   ASSISTANT:  Nurse.   ANESTHESIA:  LMA.   COMPLICATIONS:  None.   DISPOSITION:  To the recovery room stable.   PROCEDURE IN DETAIL:  The patient was taken to the operating room, placed in  the supine position where the area of the left and right neck were imaged  with ultrasound.  The patient had a small right internal jugular vein and a  large left internal jugular vein.  With the patient in Trendelenburg  position, the right and left neck and chest were prepped and draped in the  usual sterile fashion.  Using local anesthesia and the finder needle, the  left internal jugular vein was accessed and the using the Seldinger  technique, the guide wire was passed down to the level of the superior vena  cava.  The J-wire would not pass down into the level of the right atrium or  inferior cava.  The Ental catheter was then passed over the J-wire and  manipulation was attempted with the J guide wire.  The catheter was still  not passed centrally.  For this reason, Omnipaque was injected through the  Ental catheter and this revealed patency of the superior cava.  An angled  Glidewire was then used to manipulate down to the level of the right atrium  and the inferior vena cava.  The dilator and peel-away sheath was passed  over this and the dilator and guide wire were removed.  A 32 cm Diatek was  positioned down to the level of  the right atrium.  The catheter was brought  through a subcutaneous tunnel through a separate stab incision and the two  lumen ports were attached.  Both lumens flushed and aspirated easily, were  locked with 1000 units per cc of heparin.  The catheter was secured to the  skin with 3-0 nylon suture and the needle sites closed with a 4-0  subcuticular Vicryl suture.  A sterile dressing was applied and the patient  was taken to the recovery room where a chest x-ray was obtained.      TFE/MEDQ  D:  03/19/2005  T:  03/19/2005  Job:  [id number]

## 2011-03-10 ENCOUNTER — Other Ambulatory Visit (INDEPENDENT_AMBULATORY_CARE_PROVIDER_SITE_OTHER): Payer: Medicare Other | Admitting: Internal Medicine

## 2011-03-10 ENCOUNTER — Telehealth: Payer: Self-pay | Admitting: *Deleted

## 2011-03-10 DIAGNOSIS — E785 Hyperlipidemia, unspecified: Secondary | ICD-10-CM

## 2011-03-10 NOTE — Telephone Encounter (Signed)
[name] calls for completed medical neccessity forms, informed chilon.

## 2011-04-07 ENCOUNTER — Other Ambulatory Visit: Payer: Self-pay | Admitting: Internal Medicine

## 2011-04-30 ENCOUNTER — Ambulatory Visit: Payer: Medicare Other | Admitting: Vascular Surgery

## 2011-05-28 ENCOUNTER — Ambulatory Visit: Payer: Medicare Other | Admitting: Vascular Surgery

## 2011-05-28 ENCOUNTER — Ambulatory Visit (INDEPENDENT_AMBULATORY_CARE_PROVIDER_SITE_OTHER): Payer: Medicare Other | Admitting: Physician Assistant

## 2011-05-28 ENCOUNTER — Encounter: Payer: Self-pay | Admitting: Physician Assistant

## 2011-05-28 VITALS — BP 92/57 | HR 112

## 2011-05-28 DIAGNOSIS — N186 End stage renal disease: Secondary | ICD-10-CM

## 2011-05-28 DIAGNOSIS — L97509 Non-pressure chronic ulcer of other part of unspecified foot with unspecified severity: Secondary | ICD-10-CM

## 2011-05-28 NOTE — Progress Notes (Signed)
Subjective:     Patient ID: [name], male   DOB: 03/11/1951, 60 y.o.   MRN: [id number]  HPI 60 y/o BM who returns today for f/u of his right great toe wound and right AV thigh graft.  His sister states that he has been doing great and that his toe wound has improved.  He is on HD and they are using his thigh graft without difficulty.   Review of Systems The pt is having less pain at his right great toe wound. Denies any fever or chills.     Objective:   Physical Exam Filed Vitals:   05/28/11 1308  BP: 92/57  Pulse: 112   He has a well healed incision in his right groin.  His Right thigh AVGraft has a good thrill.  His right great toe wound is doing well with no drainage.  He has palpable DP pulses bilaterally.    Assessment:     This is a 60 y/o BM who returns today for f/u of his right great toe wound and right thigh AVGraft.  His Right toe wound is healing nicely and his AV Graft is being used without difficulty.    Plan:     The pt will schedule a f/u appt for 3 months.  If his care takers feel he is doing well and not having any problems, then they may elect to push his f/u appt out to a later date.  Dr. Edilia Bo did see the pt and is in agreement with this plan.  Dr. Edilia Bo is attending MD

## 2011-06-10 ENCOUNTER — Other Ambulatory Visit: Payer: Self-pay | Admitting: *Deleted

## 2011-06-10 MED ORDER — CLONAZEPAM 0.5 MG PO TABS: 0.2500 mg | ORAL_TABLET | Freq: Two times a day (BID) | ORAL | Status: DC

## 2011-06-10 NOTE — Telephone Encounter (Signed)
Rx faxed in.

## 2011-06-10 NOTE — Telephone Encounter (Signed)
Last refill 7/18. 

## 2011-07-02 ENCOUNTER — Other Ambulatory Visit: Payer: Self-pay | Admitting: Internal Medicine

## 2011-07-09 ENCOUNTER — Encounter: Payer: Self-pay | Admitting: Vascular Surgery

## 2011-07-11 LAB — POCT I-STAT 4, (NA,K, GLUC, HGB,HCT)
Hemoglobin: 11.6 — ABNORMAL LOW
Potassium: 4.5

## 2011-07-18 LAB — DIFFERENTIAL
Basophils Absolute: 0
Basophils Absolute: 0
Eosinophils Absolute: 0.1
Eosinophils Relative: 1
Lymphocytes Relative: 12
Lymphs Abs: 0.8
Monocytes Absolute: 0.5
Monocytes Relative: 8
Neutro Abs: 5.3
Neutrophils Relative %: 64

## 2011-07-18 LAB — COMPREHENSIVE METABOLIC PANEL
ALT: 16
AST: 23
CO2: 26
Chloride: 100
Creatinine, Ser: 11.94 — ABNORMAL HIGH
GFR calc Af Amer: 5 — ABNORMAL LOW
GFR calc non Af Amer: 4 — ABNORMAL LOW
Glucose, Bld: 114 — ABNORMAL HIGH
Sodium: 144
Total Bilirubin: 0.7

## 2011-07-18 LAB — CBC
Hemoglobin: 11.8 — ABNORMAL LOW
Hemoglobin: 9.8 — ABNORMAL LOW
MCHC: 32
MCHC: 32
MCV: 86.1
RBC: 3.61 — ABNORMAL LOW
RBC: 4.28
RBC: 3.92 — ABNORMAL LOW
WBC: 6.7
WBC: 9.3
WBC: 7.2

## 2011-07-18 LAB — POCT I-STAT, CHEM 8
BUN: 24 — ABNORMAL HIGH
Creatinine, Ser: 8.6 — ABNORMAL HIGH
Potassium: 3.8
Sodium: 144
TCO2: 32

## 2011-07-18 LAB — RENAL FUNCTION PANEL
Albumin: 2.9 — ABNORMAL LOW
BUN: 32 — ABNORMAL HIGH
CO2: 30
CO2: 27
Calcium: 9.2
Chloride: 100
Chloride: 97
GFR calc Af Amer: 5 — ABNORMAL LOW
GFR calc non Af Amer: 4 — ABNORMAL LOW
Glucose, Bld: 94
Potassium: 3.6
Potassium: 4.1
Sodium: 137

## 2011-07-18 LAB — CARDIAC PANEL(CRET KIN+CKTOT+MB+TROPI)
CK, MB: 1.2
CK, MB: 1.3
Relative Index: 0.6
Relative Index: 0.7
Total CK: 178
Total CK: 184
Troponin I: <0.01

## 2011-07-18 LAB — POCT CARDIAC MARKERS: Myoglobin, poc: >500

## 2011-07-18 LAB — LIPASE, BLOOD: Lipase: 46

## 2011-07-18 LAB — TSH: TSH: 3.777

## 2011-07-21 LAB — COMPREHENSIVE METABOLIC PANEL
ALT: 26
Albumin: 3.2 — ABNORMAL LOW
Alkaline Phosphatase: 49
Alkaline Phosphatase: 61
BUN: 89 — ABNORMAL HIGH
Calcium: 9.1
Glucose, Bld: 118 — ABNORMAL HIGH
Glucose, Bld: 136 — ABNORMAL HIGH
Potassium: 4.4
Potassium: 5.6 — ABNORMAL HIGH
Sodium: 146 — ABNORMAL HIGH
Total Protein: 6.3
Total Protein: 6.4

## 2011-07-21 LAB — DIFFERENTIAL
Basophils Relative: 0
Eosinophils Absolute: 0
Monocytes Absolute: 0.9
Monocytes Relative: 11

## 2011-07-21 LAB — CBC
HCT: 31.7 — ABNORMAL LOW
HCT: 31.9 — ABNORMAL LOW
Hemoglobin: 10.2 — ABNORMAL LOW
Hemoglobin: 10.4 — ABNORMAL LOW
Hemoglobin: 9 — ABNORMAL LOW
MCHC: 32.5
MCHC: 32.7
MCHC: 34.1
Platelets: 125 — ABNORMAL LOW
RDW: 18.9 — ABNORMAL HIGH
RDW: 19.3 — ABNORMAL HIGH
RDW: 19.5 — ABNORMAL HIGH
RDW: 19.6 — ABNORMAL HIGH
WBC: 8.7

## 2011-07-21 LAB — OCCULT BLOOD X 1 CARD TO LAB, STOOL: Fecal Occult Bld: POSITIVE

## 2011-07-21 LAB — VITAMIN B12: Vitamin B-12: 493 (ref 211–911)

## 2011-07-21 LAB — TSH: TSH: 7.391 — ABNORMAL HIGH

## 2011-07-21 LAB — TYPE AND SCREEN

## 2011-07-21 LAB — BASIC METABOLIC PANEL
BUN: 53 — ABNORMAL HIGH
BUN: 75 — ABNORMAL HIGH
Calcium: 8.7
Calcium: 8.8
Creatinine, Ser: 13.6 — ABNORMAL HIGH
Creatinine, Ser: 9.68 — ABNORMAL HIGH
GFR calc non Af Amer: 4 — ABNORMAL LOW
GFR calc non Af Amer: 6 — ABNORMAL LOW
Potassium: 4.5
Potassium: 5.1

## 2011-07-21 LAB — MAGNESIUM
Magnesium: 2.2
Magnesium: 2.4

## 2011-07-21 LAB — PHOSPHORUS: Phosphorus: 3.9

## 2011-07-21 LAB — FOLATE RBC: RBC Folate: 1674 — ABNORMAL HIGH

## 2011-07-22 LAB — RENAL FUNCTION PANEL
CO2: 22
GFR calc Af Amer: 4 — ABNORMAL LOW
GFR calc non Af Amer: 3 — ABNORMAL LOW
Glucose, Bld: 82
Phosphorus: 8.3 — ABNORMAL HIGH
Potassium: 4.8
Sodium: 141

## 2011-07-22 LAB — CBC
HCT: 26.3 — ABNORMAL LOW
Hemoglobin: 11.6 — ABNORMAL LOW
MCHC: 32.4
MCV: 88.4
Platelets: 149 — ABNORMAL LOW
Platelets: 168
Platelets: 190
RBC: 4.03 — ABNORMAL LOW
RDW: 19.3 — ABNORMAL HIGH
RDW: 19.4 — ABNORMAL HIGH
RDW: 20.1 — ABNORMAL HIGH
WBC: 6.1
WBC: 6.1

## 2011-07-22 LAB — CROSSMATCH

## 2011-07-22 LAB — BASIC METABOLIC PANEL
Calcium: 8.7
Creatinine, Ser: 13.07 — ABNORMAL HIGH
GFR calc Af Amer: 5 — ABNORMAL LOW
GFR calc non Af Amer: 4 — ABNORMAL LOW

## 2011-07-22 LAB — OCCULT BLOOD X 1 CARD TO LAB, STOOL: Fecal Occult Bld: POSITIVE

## 2011-07-22 LAB — PHOSPHORUS: Phosphorus: 8.5 — ABNORMAL HIGH

## 2011-08-11 ENCOUNTER — Other Ambulatory Visit: Payer: Self-pay | Admitting: Internal Medicine

## 2011-09-02 ENCOUNTER — Encounter: Payer: Self-pay | Admitting: Vascular Surgery

## 2011-09-03 ENCOUNTER — Encounter: Payer: Self-pay | Admitting: Vascular Surgery

## 2011-09-03 ENCOUNTER — Ambulatory Visit (INDEPENDENT_AMBULATORY_CARE_PROVIDER_SITE_OTHER): Payer: Medicare Other | Admitting: Vascular Surgery

## 2011-09-03 VITALS — BP 121/83 | HR 95 | Temp 97.8°F | Ht 62.0 in | Wt 137.0 lb

## 2011-09-03 DIAGNOSIS — N186 End stage renal disease: Secondary | ICD-10-CM

## 2011-09-03 DIAGNOSIS — I739 Peripheral vascular disease, unspecified: Secondary | ICD-10-CM

## 2011-09-03 DIAGNOSIS — L98499 Non-pressure chronic ulcer of skin of other sites with unspecified severity: Secondary | ICD-10-CM

## 2011-09-03 NOTE — Progress Notes (Signed)
[facility] [place]  Patient name: [name] MRN: [id number] DOB: 1950-12-29 Sex: male  CC: all of the right great toe wound.  HPI: [name] is a 60 y.o. male who had placement of a right thigh AV graft on 11/22/2010. He developed a small wound on the tip of his right great toe during a difficult postoperative course after his thigh graft was placed. Last seen in our office by Newton Pigg on 05/28/2011. He comes in for a followup visit. The history is obtained from the family. He's had no problems with dialysis and no right foot pain. He's had no fever or chills.  Past Medical History  Diagnosis Date  . Upper respiratory infection 12/12/2009  . Heart murmur, systolic 08/10/2009  . 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 Hypotension 05/26/2009  . Syncope 05/07/2009  . 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 Esophageal varices 11/07/2008  . Redness or discharge of eye 11/06/2008  . Skin tag 08/25/2008  . Gastric ulcer 10/09    antral, with h pylori positive  . Abdominal pain, acute, generalized 06/28/2008  . Congestive heart failure 03/06/2008  . Cellulitis and abscess of leg, except foot 03/06/2008  . Sinus tachycardia 03/06/2008  . Edema 03/06/2008  . Impacted cerumen of both ears 03/06/2008  . Secondary hyperparathyroidism 02/02/2008  . Mute 02/02/2008  . Mental retardation 02/02/2008  . Renal failure 02/02/2008  . Hypertension 02/02/2008  . Hyperlipidemia 02/02/2008  . Anemia 02/02/2008  . ESRD (end stage renal disease)     History reviewed. No pertinent family history.  SOCIAL HISTORY: History  Substance Use Topics  . Smoking status: Never Smoker   . Smokeless tobacco: Not on file  . Alcohol Use: No    Allergies  Allergen Reactions  . Ace Inhibitors     Current Outpatient Prescriptions  Medication Sig Dispense Refill  . atorvastatin (LIPITOR) 20 MG tablet Take 1 tablet (20 mg total) by mouth at bedtime.  30 tablet  11  . B  Complex-C-Folic Acid (DIALYVITE TABLET) TABS TAKE ONE TABLET EVERY DAY  30 tablet  3  . clonazePAM (KLONOPIN) 0.5 MG tablet Take 0.5 tablets (0.25 mg total) by mouth 2 (two) times daily.  30 tablet  3  . midodrine (PROAMATINE) 10 MG tablet Take 15 mg by mouth 2 (two) times daily.        . pantoprazole (PROTONIX) 40 MG tablet TAKE ONE TABLET TWICE DAILY  60 tablet  3  . paricalcitol (ZEMPLAR) 2 MCG/ML injection Inject 3 mcg into the vein. Tuesday, Thursday, Saturday with dialysis       . calcium elemental as carbonate (TUMS ULTRA 1000) 400 MG tablet Chew 1,000 mg by mouth 3 (three) times daily. Take with meals       . calcium gluconate 500 MG tablet Take 500 mg by mouth daily.        Marland Kitchen epoetin alfa (EPOGEN,PROCRIT) 3000 UNIT/ML injection Inject 3,400 Units into the vein 3 (three) times a week.          REVIEW OF SYSTEMS: Arly.Keller ] denotes positive finding; [  ] denotes negative finding CARDIOVASCULAR:  [ ]  chest pain   [ ]  chest pressure   [ ]  palpitations   [ ]  orthopnea   [ ]  dyspnea on exertion   [ ]  claudication   [ ]  rest pain   [ ]  DVT   [ ]  phlebitis PULMONARY:   [ ]  productive cough   [ ]  asthma   [ ]   wheezing  PHYSICAL EXAM: Filed Vitals:   09/03/11 1331  BP: 121/83  Pulse: 95  Temp: 97.8 F (36.6 C)  TempSrc: Oral  Height: 5\' 2"  (1.575 m)  Weight: 137 lb (62.143 kg)  SpO2: 100%   Body mass index is 25.06 kg/(m^2). GENERAL: The patient is a well-nourished male, in no acute distress. The vital signs are documented above. CARDIOVASCULAR: There is a regular rate and rhythm without significant murmur appreciated.  PULMONARY: There is good air exchange bilaterally without wheezing or rales. His right thigh graft has an excellent bruit and thrill. His incisions are all healed. Currently the wound on his right great toe has completely healed.  MEDICAL ISSUES: Overall he is doing well the wound in his toe is healed and his graft is functioning well. We will see him back as  needed.  Adrien Shankar S [facility] [place] Office: [phone]

## 2011-10-06 ENCOUNTER — Other Ambulatory Visit: Payer: Self-pay | Admitting: Internal Medicine

## 2011-10-07 ENCOUNTER — Other Ambulatory Visit: Payer: Self-pay | Admitting: Internal Medicine

## 2011-10-07 NOTE — Telephone Encounter (Signed)
Refill for Klonopin was called to [facility].  Angelina Ok, RN 10/07/2011 1:26 PM

## 2011-10-15 ENCOUNTER — Encounter (HOSPITAL_COMMUNITY): Payer: Self-pay

## 2011-10-15 ENCOUNTER — Ambulatory Visit (HOSPITAL_COMMUNITY)
Admission: RE | Admit: 2011-10-15 | Discharge: 2011-10-15 | Disposition: A | Discharge status: Home / Self Care | Payer: Medicare Other | Source: Ambulatory Visit | Attending: Nephrology | Admitting: Nephrology

## 2011-10-15 ENCOUNTER — Other Ambulatory Visit (HOSPITAL_COMMUNITY): Payer: Self-pay | Admitting: Nephrology

## 2011-10-15 DIAGNOSIS — N186 End stage renal disease: Secondary | ICD-10-CM

## 2011-10-15 DIAGNOSIS — Y832 Surgical operation with anastomosis, bypass or graft as the cause of abnormal reaction of the patient, or of later complication, without mention of misadventure at the time of the procedure: Secondary | ICD-10-CM | POA: Insufficient documentation

## 2011-10-15 DIAGNOSIS — I871 Compression of vein: Secondary | ICD-10-CM | POA: Insufficient documentation

## 2011-10-15 DIAGNOSIS — T82898A Other specified complication of vascular prosthetic devices, implants and grafts, initial encounter: Secondary | ICD-10-CM | POA: Insufficient documentation

## 2011-10-15 DIAGNOSIS — Z992 Dependence on renal dialysis: Secondary | ICD-10-CM

## 2011-10-15 LAB — POCT I-STAT 4, (NA,K, GLUC, HGB,HCT)
HCT: 35 % — ABNORMAL LOW (ref 39.0–52.0)
Hemoglobin: 11.9 g/dL — ABNORMAL LOW (ref 13.0–17.0)
Potassium: 4.5 mEq/L (ref 3.5–5.1)
Sodium: 150 mEq/L — ABNORMAL HIGH (ref 135–145)

## 2011-10-15 MED ORDER — IOHEXOL 300 MG/ML  SOLN
50.0000 mL | Freq: Once | INTRAMUSCULAR | Status: AC | PRN
  Administered 2011-10-15: 50 mL via INTRAVENOUS

## 2011-10-15 MED ORDER — MIDAZOLAM HCL 5 MG/5ML IJ SOLN
INTRAMUSCULAR | Status: AC | PRN
  Administered 2011-10-15: 1 mg via INTRAVENOUS

## 2011-10-15 MED ORDER — FENTANYL CITRATE 0.05 MG/ML IJ SOLN
INTRAMUSCULAR | Status: AC | PRN
  Administered 2011-10-15: 50 ug via INTRAVENOUS

## 2011-10-15 MED ORDER — MIDAZOLAM HCL 2 MG/2ML IJ SOLN
INTRAMUSCULAR | Status: AC
  Filled 2011-10-15: qty 2

## 2011-10-15 MED ORDER — FENTANYL CITRATE 0.05 MG/ML IJ SOLN
INTRAMUSCULAR | Status: AC
  Filled 2011-10-15: qty 2

## 2011-10-15 MED ORDER — ALTEPLASE 2 MG IJ SOLR
2.0000 mg | INTRAMUSCULAR | Status: AC
Start: 2011-10-15 — End: 2011-10-15
  Administered 2011-10-15: 2 mg
  Filled 2011-10-15: qty 2

## 2011-10-15 NOTE — ED Notes (Signed)
Pt's family given ins

## 2011-10-15 NOTE — Procedures (Signed)
Declot R thigh HD graft 7mm PTA venous anast No complication No blood loss. See complete dictation in [facility].

## 2011-10-15 NOTE — H&P (Signed)
[name] is an 60 y.o. male.   Chief Complaint: right leg graft clotted; for thrombolysis and possible angioplasty/stent placement. Possible dialysis catheter placement.  HPI: ESRD; clotted rt leg graft  Past Medical History  Diagnosis Date  . Upper respiratory infection 12/12/2009  . Heart murmur, systolic 08/10/2009  . 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 Hypotension 05/26/2009  . Syncope 05/07/2009  . 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 Esophageal varices 11/07/2008  . Redness or discharge of eye 11/06/2008  . Skin tag 08/25/2008  . Gastric ulcer 10/09    antral, with h pylori positive  . Abdominal pain, acute, generalized 06/28/2008  . Congestive heart failure 03/06/2008  . Cellulitis and abscess of leg, except foot 03/06/2008  . Sinus tachycardia 03/06/2008  . Edema 03/06/2008  . Impacted cerumen of both ears 03/06/2008  . Secondary hyperparathyroidism 02/02/2008  . Mute 02/02/2008  . Mental retardation 02/02/2008  . Renal failure 02/02/2008  . Hypertension 02/02/2008  . Hyperlipidemia 02/02/2008  . Anemia 02/02/2008  . ESRD (end stage renal disease)     Past Surgical History  Procedure Date  . Left forearm graft     for HD  . Arteriovenous graft placement 11/22/10    Right thigh AVG    No family history on file. Social History:  reports that he has never smoked. He does not have any smokeless tobacco history on file. He reports that he does not drink alcohol or use illicit drugs.  Allergies:  Allergies  Allergen Reactions  . Ace Inhibitors     Medications Prior to Admission  Medication Sig Dispense Refill  . atorvastatin (LIPITOR) 20 MG tablet Take 1 tablet (20 mg total) by mouth at bedtime.  30 tablet  11  . B Complex-C-Folic Acid (DIALYVITE TABLET) TABS TAKE ONE TABLET EVERY DAY  30 tablet  3  . clonazePAM (KLONOPIN) 0.5 MG tablet TAKE 1/2 TABLET TWICE DAILY  30 tablet  1  . epoetin alfa (EPOGEN,PROCRIT) 3000 UNIT/ML injection Inject 3,400 Units into the  vein 3 (three) times a week.        . midodrine (PROAMATINE) 10 MG tablet Take 15 mg by mouth 2 (two) times daily.        . pantoprazole (PROTONIX) 40 MG tablet TAKE ONE TABLET TWICE DAILY  60 tablet  3  . paricalcitol (ZEMPLAR) 2 MCG/ML injection Inject 3 mcg into the vein. Tuesday, Thursday, Saturday with dialysis       . calcium elemental as carbonate (TUMS ULTRA 1000) 400 MG tablet Chew 1,000 mg by mouth 3 (three) times daily. Take with meals       . calcium gluconate 500 MG tablet Take 500 mg by mouth daily.         Medications Prior to Admission  Medication Dose Route Frequency Provider Last Rate Last Dose  . alteplase (CATHFLO ACTIVASE) injection 2 mg  2 mg Intracatheter to XRAY [facility]        No results found for this or any previous visit (from the past 48 hour(s)). No results found.  Review of Systems  Constitutional: Negative for fever.  Respiratory: Negative for cough.   Cardiovascular: Negative for chest pain.  Gastrointestinal: Negative for nausea, vomiting, abdominal pain and diarrhea.  Neurological: Negative for headaches.  Psychiatric/Behavioral:       Mentally handicap    There were no vitals taken for this visit. Physical Exam  Constitutional: He appears well-developed and well-nourished.  HENT:  Head: Normocephalic.  Eyes:  EOM are normal.  Neck: Normal range of motion. Neck supple.  Cardiovascular: Normal rate, regular rhythm and normal heart sounds.   No murmur heard. Respiratory: Effort normal and breath sounds normal. He has no wheezes.  GI: Soft. Bowel sounds are normal.  Musculoskeletal: Normal range of motion.  Neurological:       mentally handicap  Skin: Skin is warm and dry.     Assessment/Plan Rt leg graft clotted for thrombolysis and possible pta/stent placement. Possible HD catheter placement. pts family aware of procedure benefits and risks and agreeable to proceed. Consent signed.  Abubakar Crispo A 10/15/2011, 9:08  AM

## 2011-10-15 NOTE — ED Notes (Signed)
Pt discharge instructions given to sister.  Pt's care released to his family

## 2011-10-15 NOTE — ED Notes (Signed)
10:35 3000 units

## 2011-12-09 ENCOUNTER — Other Ambulatory Visit: Payer: Self-pay | Admitting: Internal Medicine

## 2011-12-10 ENCOUNTER — Other Ambulatory Visit: Payer: Self-pay | Admitting: Internal Medicine

## 2012-01-07 ENCOUNTER — Encounter: Payer: Self-pay | Admitting: Internal Medicine

## 2012-01-07 ENCOUNTER — Ambulatory Visit (INDEPENDENT_AMBULATORY_CARE_PROVIDER_SITE_OTHER): Payer: Medicare Other | Admitting: Internal Medicine

## 2012-01-07 VITALS — BP 92/62 | HR 102 | Temp 97.9°F | Wt 136.3 lb

## 2012-01-07 DIAGNOSIS — I959 Hypotension, unspecified: Secondary | ICD-10-CM

## 2012-01-07 DIAGNOSIS — E785 Hyperlipidemia, unspecified: Secondary | ICD-10-CM

## 2012-01-07 DIAGNOSIS — R531 Weakness: Secondary | ICD-10-CM

## 2012-01-07 DIAGNOSIS — N186 End stage renal disease: Secondary | ICD-10-CM

## 2012-01-07 DIAGNOSIS — R110 Nausea: Secondary | ICD-10-CM | POA: Insufficient documentation

## 2012-01-07 MED ORDER — SEVELAMER HCL 800 MG PO TABS: 1600.0000 mg | ORAL_TABLET | Freq: Three times a day (TID) | ORAL | Status: DC

## 2012-01-07 NOTE — Progress Notes (Signed)
Patient ID: [name], male   DOB: Jan 30, 1951, 61 y.o.   MRN: [id number]  HPI:   Patient is a 61 yo m with a PMH listed below, presents to the [facility] for a routine visit. He is here with his caretaker. He receives the majority of his care from [facility]. No recent events, patient's care GERD does report the patient has had some nausea and reduced appetite along with rhinorrhea. Denies any fever or chills, No other major concerns from his care taker.  Review of Systems: Negative except per history of present illness  Physical Exam:  Nursing notes and vitals reviewed General:  alert, well-developed, and cooperative to examination.   Lungs:  normal respiratory effort, no accessory muscle use, normal breath sounds, no crackles, and no wheezes. Heart:  normal rate, regular rhythm, no murmurs, no gallop, and no rub.   Abdomen:  soft, non-tender, normal bowel sounds, no distention, no guarding, no rebound tenderness, no hepatomegaly, and no splenomegaly.   Extremities:  No cyanosis, clubbing, edema Neurologic:  alert & oriented X0, severe MR, wheelchair bound  Meds: Medications Prior to Admission  Medication Sig Dispense Refill  . atorvastatin (LIPITOR) 20 MG tablet Take 1 tablet (20 mg total) by mouth at bedtime.  30 tablet  11  . B Complex-C-Folic Acid (DIALYVITE TABLET) TABS TAKE ONE TABLET EVERY DAY  30 tablet  3  . calcium elemental as carbonate (TUMS ULTRA 1000) 400 MG tablet Chew 1,000 mg by mouth 3 (three) times daily. Take with meals       . calcium gluconate 500 MG tablet Take 500 mg by mouth daily.        . clonazePAM (KLONOPIN) 0.5 MG tablet TAKE 1/2 TABLET TWICE DAILY  30 tablet  5  . epoetin alfa (EPOGEN,PROCRIT) 3000 UNIT/ML injection Inject 3,400 Units into the vein 3 (three) times a week.        . midodrine (PROAMATINE) 10 MG tablet Take 15 mg by mouth 2 (two) times daily.        . pantoprazole (PROTONIX) 40 MG tablet TAKE ONE TABLET TWICE DAILY  30 tablet  3  . paricalcitol  (ZEMPLAR) 2 MCG/ML injection Inject 3 mcg into the vein. Tuesday, Thursday, Saturday with dialysis        No current facility-administered medications on file as of 01/07/2012.    Allergies: Ace inhibitors Past Medical History  Diagnosis Date  . Upper respiratory infection 12/12/2009  . Heart murmur, systolic 08/10/2009  . 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 Hypotension 05/26/2009  . Syncope 05/07/2009  . 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 Esophageal varices 11/07/2008  . Redness or discharge of eye 11/06/2008  . Skin tag 08/25/2008  . Gastric ulcer 10/09    antral, with h pylori positive  . Abdominal pain, acute, generalized 06/28/2008  . Congestive heart failure 03/06/2008  . Cellulitis and abscess of leg, except foot 03/06/2008  . Sinus tachycardia 03/06/2008  . Edema 03/06/2008  . Impacted cerumen of both ears 03/06/2008  . Secondary hyperparathyroidism 02/02/2008  . Mute 02/02/2008  . Mental retardation 02/02/2008  . Renal failure 02/02/2008  . Hypertension 02/02/2008  . Hyperlipidemia 02/02/2008  . Anemia 02/02/2008  . ESRD (end stage renal disease)    Past Surgical History  Procedure Date  . Left forearm graft     for HD  . Arteriovenous graft placement 11/22/10    Right thigh AVG   No family history on file. History   Social History  .  Marital Status: Single    Spouse Name: N/A    Number of Children: N/A  . Years of Education: N/A   Occupational History  . Not on file.   Social History Main Topics  . Smoking status: Never Smoker   . Smokeless tobacco: Not on file  . Alcohol Use: No  . Drug Use: No  . Sexually Active: No   Other Topics Concern  . Not on file   Social History Narrative  . No narrative on file

## 2012-01-07 NOTE — Assessment & Plan Note (Signed)
On Tuesday Thursday Saturday schedule

## 2012-01-07 NOTE — Assessment & Plan Note (Signed)
Patient's nausea is likely a combination of hypotension, and possibly a viral upper respiratory infection. Attempted to obtain routine labs, however patient refused. No changes in his medications right now, continue to monitor.

## 2012-01-07 NOTE — Assessment & Plan Note (Signed)
On Lipitor 20, attempted to obtain fasting lipid however patient refused

## 2012-01-07 NOTE — Assessment & Plan Note (Signed)
On Midodrine, BP remains borderline soft.

## 2012-02-16 ENCOUNTER — Other Ambulatory Visit: Payer: Self-pay | Admitting: Internal Medicine

## 2012-02-17 ENCOUNTER — Other Ambulatory Visit (HOSPITAL_COMMUNITY): Payer: Self-pay | Admitting: Nephrology

## 2012-02-17 DIAGNOSIS — N186 End stage renal disease: Secondary | ICD-10-CM

## 2012-02-18 ENCOUNTER — Ambulatory Visit (HOSPITAL_COMMUNITY)
Admission: RE | Admit: 2012-02-18 | Discharge: 2012-02-18 | DRG: 264 | Disposition: A | Discharge status: Home / Self Care | Payer: Medicare Other | Source: Ambulatory Visit | Attending: Vascular Surgery | Admitting: Vascular Surgery

## 2012-02-18 ENCOUNTER — Encounter (HOSPITAL_COMMUNITY): Payer: Self-pay | Admitting: Anesthesiology

## 2012-02-18 ENCOUNTER — Inpatient Hospital Stay (HOSPITAL_COMMUNITY): Payer: Medicare Other | Admitting: Anesthesiology

## 2012-02-18 ENCOUNTER — Encounter (HOSPITAL_COMMUNITY)
Admission: RE | Disposition: A | Discharge status: Home / Self Care | Payer: Self-pay | Source: Ambulatory Visit | Attending: Vascular Surgery

## 2012-02-18 ENCOUNTER — Other Ambulatory Visit: Payer: Self-pay | Admitting: *Deleted

## 2012-02-18 ENCOUNTER — Ambulatory Visit (HOSPITAL_COMMUNITY)
Admission: RE | Admit: 2012-02-18 | Discharge: 2012-02-18 | Disposition: A | Discharge status: Home / Self Care | Payer: Medicare Other | Source: Ambulatory Visit | Attending: Nephrology | Admitting: Nephrology

## 2012-02-18 DIAGNOSIS — T82898A Other specified complication of vascular prosthetic devices, implants and grafts, initial encounter: Secondary | ICD-10-CM | POA: Insufficient documentation

## 2012-02-18 DIAGNOSIS — I120 Hypertensive chronic kidney disease with stage 5 chronic kidney disease or end stage renal disease: Secondary | ICD-10-CM | POA: Insufficient documentation

## 2012-02-18 DIAGNOSIS — N186 End stage renal disease: Secondary | ICD-10-CM

## 2012-02-18 DIAGNOSIS — Z8711 Personal history of peptic ulcer disease: Secondary | ICD-10-CM | POA: Insufficient documentation

## 2012-02-18 DIAGNOSIS — N2581 Secondary hyperparathyroidism of renal origin: Secondary | ICD-10-CM | POA: Insufficient documentation

## 2012-02-18 DIAGNOSIS — F79 Unspecified intellectual disabilities: Secondary | ICD-10-CM | POA: Insufficient documentation

## 2012-02-18 DIAGNOSIS — E785 Hyperlipidemia, unspecified: Secondary | ICD-10-CM | POA: Insufficient documentation

## 2012-02-18 DIAGNOSIS — Y832 Surgical operation with anastomosis, bypass or graft as the cause of abnormal reaction of the patient, or of later complication, without mention of misadventure at the time of the procedure: Secondary | ICD-10-CM | POA: Insufficient documentation

## 2012-02-18 DIAGNOSIS — Z992 Dependence on renal dialysis: Secondary | ICD-10-CM | POA: Insufficient documentation

## 2012-02-18 LAB — POCT I-STAT EG7
Acid-Base Excess: 3 mmol/L — ABNORMAL HIGH (ref 0.0–2.0)
Calcium, Ion: 1.18 mmol/L (ref 1.12–1.32)
O2 Saturation: 86 %
Potassium: 3.9 mEq/L (ref 3.5–5.1)

## 2012-02-18 SURGERY — THROMBECTOMY AND REVISION OF ARTERIOVENTOUS (AV) GORETEX  GRAFT
Anesthesia: General | Laterality: Right | Wound class: Clean

## 2012-02-18 MED ORDER — PHENYLEPHRINE HCL 10 MG/ML IJ SOLN
INTRAMUSCULAR | Status: DC | PRN
  Administered 2012-02-18: 40 ug via INTRAVENOUS
  Administered 2012-02-18 (×2): 80 ug via INTRAVENOUS

## 2012-02-18 MED ORDER — PROTAMINE SULFATE 10 MG/ML IV SOLN
INTRAVENOUS | Status: DC | PRN
  Administered 2012-02-18: 50 mg via INTRAVENOUS

## 2012-02-18 MED ORDER — EPHEDRINE SULFATE 50 MG/ML IJ SOLN
INTRAMUSCULAR | Status: DC | PRN
  Administered 2012-02-18: 5 mg via INTRAVENOUS
  Administered 2012-02-18 (×2): 10 mg via INTRAVENOUS
  Administered 2012-02-18 (×3): 5 mg via INTRAVENOUS
  Administered 2012-02-18: 10 mg via INTRAVENOUS

## 2012-02-18 MED ORDER — SODIUM CHLORIDE 0.9 % IR SOLN
Status: DC | PRN
  Administered 2012-02-18: 19:00:00

## 2012-02-18 MED ORDER — OXYCODONE HCL 5 MG PO TABS: 5.0000 mg | ORAL_TABLET | ORAL | Status: AC | PRN

## 2012-02-18 MED ORDER — FENTANYL CITRATE 0.05 MG/ML IJ SOLN
INTRAMUSCULAR | Status: DC | PRN
  Administered 2012-02-18: 25 ug via INTRAVENOUS
  Administered 2012-02-18: 100 ug via INTRAVENOUS

## 2012-02-18 MED ORDER — KETAMINE HCL 100 MG/ML IJ SOLN
INTRAMUSCULAR | Status: AC
  Filled 2012-02-18: qty 1

## 2012-02-18 MED ORDER — HEPARIN SODIUM (PORCINE) 1000 UNIT/ML IJ SOLN
INTRAMUSCULAR | Status: DC | PRN
  Administered 2012-02-18: 5 mL via INTRAVENOUS

## 2012-02-18 MED ORDER — CEFAZOLIN SODIUM 1-5 GM-% IV SOLN
INTRAVENOUS | Status: AC
  Filled 2012-02-18: qty 50

## 2012-02-18 MED ORDER — ALTEPLASE 2 MG IJ SOLR
2.0000 mg | INTRAMUSCULAR | Status: DC
  Filled 2012-02-18: qty 2

## 2012-02-18 MED ORDER — SODIUM CHLORIDE 0.9 % IV SOLN
INTRAVENOUS | Status: DC | PRN
  Administered 2012-02-18 (×2): via INTRAVENOUS

## 2012-02-18 MED ORDER — CEFAZOLIN SODIUM 1-5 GM-% IV SOLN
INTRAVENOUS | Status: DC | PRN
  Administered 2012-02-18: 1 g via INTRAVENOUS

## 2012-02-18 MED ORDER — 0.9 % SODIUM CHLORIDE (POUR BTL) OPTIME
TOPICAL | Status: DC | PRN
  Administered 2012-02-18: 1000 mL

## 2012-02-18 SURGICAL SUPPLY — 47 items
ADH SKN CLS APL DERMABOND .7 (GAUZE/BANDAGES/DRESSINGS) ×1
CANISTER SUCTION 2500CC (MISCELLANEOUS) ×2 IMPLANT
CATH EMB 4FR 80CM (CATHETERS) ×2 IMPLANT
CLIP TI MEDIUM 6 (CLIP) ×2 IMPLANT
CLIP TI WIDE RED SMALL 6 (CLIP) ×2 IMPLANT
CLOTH BEACON ORANGE TIMEOUT ST (SAFETY) ×2 IMPLANT
COVER SURGICAL LIGHT HANDLE (MISCELLANEOUS) ×4 IMPLANT
DECANTER SPIKE VIAL GLASS SM (MISCELLANEOUS) ×2 IMPLANT
DERMABOND ADVANCED (GAUZE/BANDAGES/DRESSINGS) ×1
DERMABOND ADVANCED .7 DNX12 (GAUZE/BANDAGES/DRESSINGS) ×1 IMPLANT
DRAPE INCISE IOBAN 66X45 STRL (DRAPES) ×1 IMPLANT
DRAPE X-RAY CASS 24X20 (DRAPES) IMPLANT
ELECT REM PT RETURN 9FT ADLT (ELECTROSURGICAL) ×2
ELECTRODE REM PT RTRN 9FT ADLT (ELECTROSURGICAL) ×1 IMPLANT
GAUZE SPONGE 2X2 8PLY STRL LF (GAUZE/BANDAGES/DRESSINGS) ×1 IMPLANT
GEL ULTRASOUND 20GR AQUASONIC (MISCELLANEOUS) IMPLANT
GLOVE BIO SURGEON STRL SZ7.5 (GLOVE) ×2 IMPLANT
GLOVE BIOGEL M 6.5 STRL (GLOVE) ×1 IMPLANT
GLOVE BIOGEL PI IND STRL 7.0 (GLOVE) IMPLANT
GLOVE BIOGEL PI INDICATOR 7.0 (GLOVE) ×3
GLOVE ECLIPSE 6.5 STRL STRAW (GLOVE) ×1 IMPLANT
GLOVE SURG SS PI 7.5 STRL IVOR (GLOVE) ×1 IMPLANT
GOWN PREVENTION PLUS XLARGE (GOWN DISPOSABLE) ×3 IMPLANT
GOWN STRL NON-REIN LRG LVL3 (GOWN DISPOSABLE) ×4 IMPLANT
GRAFT GORETEX 6X10 (Vascular Products) ×1 IMPLANT
KIT BASIN OR (CUSTOM PROCEDURE TRAY) ×2 IMPLANT
KIT ROOM TURNOVER OR (KITS) ×2 IMPLANT
LOOP VESSEL MINI RED (MISCELLANEOUS) IMPLANT
NS IRRIG 1000ML POUR BTL (IV SOLUTION) ×2 IMPLANT
PACK CV ACCESS (CUSTOM PROCEDURE TRAY) ×2 IMPLANT
PAD ARMBOARD 7.5X6 YLW CONV (MISCELLANEOUS) ×4 IMPLANT
SET COLLECT BLD 21X3/4 12 (NEEDLE) IMPLANT
SPONGE GAUZE 2X2 STER 10/PKG (GAUZE/BANDAGES/DRESSINGS) ×1
SPONGE GAUZE 4X4 12PLY (GAUZE/BANDAGES/DRESSINGS) ×1 IMPLANT
SPONGE SURGIFOAM ABS GEL 100 (HEMOSTASIS) IMPLANT
STOPCOCK 4 WAY LG BORE MALE ST (IV SETS) IMPLANT
SUT PROLENE 5 0 C 1 24 (SUTURE) ×5 IMPLANT
SUT PROLENE 6 0 CC (SUTURE) ×2 IMPLANT
SUT VIC AB 3-0 SH 27 (SUTURE) ×4
SUT VIC AB 3-0 SH 27X BRD (SUTURE) ×1 IMPLANT
SUT VICRYL 4-0 PS2 18IN ABS (SUTURE) ×2 IMPLANT
TAPE CLOTH SURG 4X10 WHT LF (GAUZE/BANDAGES/DRESSINGS) ×1 IMPLANT
TOWEL OR 17X24 6PK STRL BLUE (TOWEL DISPOSABLE) ×2 IMPLANT
TOWEL OR 17X26 10 PK STRL BLUE (TOWEL DISPOSABLE) ×3 IMPLANT
TUBING EXTENTION W/L.L. (IV SETS) IMPLANT
UNDERPAD 30X30 INCONTINENT (UNDERPADS AND DIAPERS) ×2 IMPLANT
WATER STERILE IRR 1000ML POUR (IV SOLUTION) ×2 IMPLANT

## 2012-02-18 NOTE — Preoperative (Signed)
Beta Blockers   Reason not to administer Beta Blockers:Not Applicable 

## 2012-02-18 NOTE — Anesthesia Preprocedure Evaluation (Addendum)
Anesthesia Evaluation  Patient identified by MRN, date of birth, ID band Patient awake and Patient confused    Reviewed: Allergy & Precautions, H&P , NPO status , Patient's Chart, lab work & pertinent test results  Airway   Neck ROM: Full   Comment: Unable to assess, Pt with MR and Mute, uncooperative Dental  (+) Poor Dentition   Pulmonary  breath sounds clear to auscultation        Cardiovascular hypertension, Pt. on medications +CHF Rhythm:Regular Rate:Normal     Neuro/Psych PSYCHIATRIC DISORDERS    GI/Hepatic PUD,   Endo/Other    Renal/GU ESRF and DialysisRenal disease     Musculoskeletal   Abdominal   Peds  Hematology   Anesthesia Other Findings Pt unable to cooperate for physical exam.   Reproductive/Obstetrics                         Anesthesia Physical Anesthesia Plan  ASA: IV  Anesthesia Plan: General   Post-op Pain Management:    Induction: Intravenous  Airway Management Planned: LMA  Additional Equipment:   Intra-op Plan:   Post-operative Plan: Extubation in OR  Informed Consent: I have reviewed the patients History and Physical, chart, labs and discussed the procedure including the risks, benefits and alternatives for the proposed anesthesia with the patient or authorized representative who has indicated his/her understanding and acceptance.     Plan Discussed with: CRNA, Anesthesiologist and Surgeon  Anesthesia Plan Comments: (Plan for Ketamine dart in OR)       Anesthesia Quick Evaluation

## 2012-02-18 NOTE — OR Nursing (Signed)
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 Arm band verified.

## 2012-02-18 NOTE — Progress Notes (Signed)
Pt is retarded and mute. He is unable to cooperate for any physical exam or history.  I have spoken with his POA by phone as she is currently ill at home.  She feels that we should proceed as we see fit to take care of him.  We are unable to draw labs to safely assess his physiologic status prior to anesthesia.  It appears that a sedating dose of IM Ketamine may be the only way of obtaining information and then proceeding with the case.  Onalee Hua Janeliz Prestwood,MD

## 2012-02-18 NOTE — Transfer of Care (Signed)
Immediate Anesthesia Transfer of Care Note  Patient: [name]  Procedure(s) Performed: Procedure(s) (LRB): THROMBECTOMY AND REVISION OF ARTERIOVENTOUS (AV) GORETEX  GRAFT (Right)  Patient Location: PACU  Anesthesia Type: General  Level of Consciousness: awake, alert  and patient cooperative  Airway & Oxygen Therapy: Patient Spontanous Breathing and Patient connected to nasal cannula oxygen  Post-op Assessment: Report given to PACU RN and Post -op Vital signs reviewed and stable  Post vital signs: Reviewed and stable  Complications: No apparent anesthesia complications

## 2012-02-18 NOTE — Op Note (Signed)
Procedure: Thrombectomy and revision of right thigh AV graft  Preoperative diagnosis: Thrombosed AV graft right thigh  Postoperative diagnosis: Same  Anesthesia: General  Assistant: Della Goo PA-C  Operative findings: Revision venous anastomosis 6 mm interposition graft  Operative details: After obtaining informed consent, the patient was taken to the operating room. The patient was placed in supine position on the operating room table. After adequate sedation, the patient's entire right lower extremity was prepped and draped in the usual sterile fashion. A scimitar shaped incision was placed in the right groin.   The incision was carried into the subcutaneous tissues down to level the graft. The graft was dissected free circumferentially. Dissection was carried down to the level of the venous anastomosis. The common femoral, superficial femoral and deep femoral veins were dissected free circumferentially and vessel loops placed around these.   This was fairly tedious due to dense adhesions.  The patient was given 5,000 units of intravenous heparin. The venous limb of the graft was divided just above the level of the anastomosis. A #4 Fogarty catheter was used to thrombectomize the venous limb of the graft. There was some venous backbleeding but there was thick intimal hyperplasia within the vein and the vein was quite thickened. Therefore the vein was opened longitudinally past the segment intimal hyperplasia. The thickened portion of vein was debrided away. The arterial limb the graft was then thrombectomized with a #4 Fogarty catheter excellent arterial inflow was obtained. This was clamped proximally with a fistula clamp. A new 6 mm interposition graft was brought up in the operative field and sewn end-to-side to the vein using a running 6-0 Prolene suture. At completion of the anastomosis the venous limb was thoroughly flushed with heparinized saline and reoccluded. The graft was cut to length  in preparation for sewing to the proximal graft. This was then sewn end-to-end to the proximal arterial limb of the graft using running 6-0 Prolene suture. Just prior to completion, the anastomosis was forebled backbled and thoroughly flushed. The anastomosis was secured; clamps were released; and there was a palpable thrill in the graft immediately. Hemostasis was obtained with direct pressure. The patient was also given 50 mg of protamine.  The subcutaneous tissues were reapproximated with running 3-0 Vicryl suture. The skin was closed with a 4 0 Vicryl subcuticular stitch. Dermabond was applied the incision.  The patient tolerated the procedure well and there were no complications. Instrument sponge and needle counts were correct at the end of the case. The patient was taken to the recovery room in stable condition.  Fabienne Bruns, MD [facility] Office: [phone] Pager: [phone]

## 2012-02-18 NOTE — Anesthesia Postprocedure Evaluation (Signed)
  Anesthesia Post-op Note  Patient: [name]  Procedure(s) Performed: Procedure(s) (LRB): THROMBECTOMY AND REVISION OF ARTERIOVENTOUS (AV) GORETEX  GRAFT (Right)  Patient Location: PACU  Anesthesia Type: General  Level of Consciousness: awake and patient cooperative  Airway and Oxygen Therapy: Patient Spontanous Breathing and Patient connected to nasal cannula oxygen  Post-op Pain: mild  Post-op Assessment: Post-op Vital signs reviewed, Patient's Cardiovascular Status Stable, Respiratory Function Stable, Patent Airway, No signs of Nausea or vomiting and Pain level controlled  Post-op Vital Signs: stable  Complications: No apparent anesthesia complications

## 2012-02-18 NOTE — H&P (Signed)
[facility] SHORT STAY H&P  CC:  Clotted thigh graft HPI: Mentally slow pt.  Could not have declot of graft in IR due to combativeness.  Past Medical History  Diagnosis Date  . Upper respiratory infection 12/12/2009  . Heart murmur, systolic 08/10/2009  . 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 Hypotension 05/26/2009  . Syncope 05/07/2009  . 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 Esophageal varices 11/07/2008  . Redness or discharge of eye 11/06/2008  . Skin tag 08/25/2008  . Gastric ulcer 10/09    antral, with h pylori positive  . Abdominal pain, acute, generalized 06/28/2008  . Congestive heart failure 03/06/2008  . Cellulitis and abscess of leg, except foot 03/06/2008  . Sinus tachycardia 03/06/2008  . Edema 03/06/2008  . Impacted cerumen of both ears 03/06/2008  . Secondary hyperparathyroidism 02/02/2008  . Mute 02/02/2008  . Mental retardation 02/02/2008  . Renal failure 02/02/2008  . Hypertension 02/02/2008  . Hyperlipidemia 02/02/2008  . Anemia 02/02/2008  . ESRD (end stage renal disease)     FH:  Non-Contributory  Social HX History  Substance Use Topics  . Smoking status: Never Smoker   . Smokeless tobacco: Not on file  . Alcohol Use: No    Allergies Allergies  Allergen Reactions  . Ace Inhibitors     Medications No current facility-administered medications for this encounter.   Current Outpatient Prescriptions  Medication Sig Dispense Refill  . atorvastatin (LIPITOR) 20 MG tablet Take 1 tablet (20 mg total) by mouth at bedtime.  30 tablet  11  . B Complex-C-Folic Acid (DIALYVITE TABLET) TABS TAKE ONE TABLET EVERY DAY  30 tablet  3  . calcium elemental as carbonate (TUMS ULTRA 1000) 400 MG tablet Chew 1,000 mg by mouth 3 (three) times daily. Take with meals       . calcium gluconate 500 MG tablet Take 500 mg by mouth daily.        . clonazePAM (KLONOPIN) 0.5 MG tablet TAKE 1/2 TABLET TWICE DAILY  30 tablet  5  . epoetin alfa (EPOGEN,PROCRIT)  3000 UNIT/ML injection Inject 3,400 Units into the vein 3 (three) times a week.        . midodrine (PROAMATINE) 10 MG tablet Take 15 mg by mouth 2 (two) times daily.        . pantoprazole (PROTONIX) 40 MG tablet TAKE ONE TABLET TWICE DAILY  60 tablet  2  . paricalcitol (ZEMPLAR) 2 MCG/ML injection Inject 3 mcg into the vein. Tuesday, Thursday, Saturday with dialysis       . sevelamer (RENAGEL) 800 MG tablet Take 2 tablets (1,600 mg total) by mouth 3 (three) times daily with meals.  180 tablet  11   Facility-Administered Medications Ordered in Other Encounters  Medication Dose Route Frequency Provider Last Rate Last Dose  . alteplase (CATHFLO ACTIVASE) injection 2 mg  2 mg Intracatheter to XRAY Art A Hoss, MD        Labs pending   PHYSICAL EXAM  There were no vitals filed for this visit.  General:  WDWN in NAD HENT: WNL Eyes: Pupils equal Pulmonary: normal non-labored breathing , without Rales, rhonchi,  wheezing Cardiac: RRR, Vascular Exam/Pulses:  Clotted right thigh graft  Extremities without ischemic changes, no Gangrene , no cellulitis; no open wounds;  Neuro A&O x 3; good sensation; motion in all extremities  Impression: Clotted right thigh graft  Plan: Thrombectomy revision thigh graft today  Alicianna Litchford E @TODAY @ 1:20 PM

## 2012-02-18 NOTE — Anesthesia Procedure Notes (Addendum)
Procedure Name: LMA Insertion Date/Time: 02/18/2012 5:47 PM Performed by: Rogelia Boga Pre-anesthesia Checklist: Patient identified, Emergency Drugs available, Suction available, Patient being monitored and Timeout performed Patient Re-evaluated:Patient Re-evaluated prior to inductionOxygen Delivery Method: Circle system utilized Preoxygenation: Pre-oxygenation with 100% oxygen Intubation Type: Inhalational induction Ventilation: Mask ventilation without difficulty LMA: LMA with gastric port inserted LMA Size: 5.0 Number of attempts: 1 Placement Confirmation: positive ETCO2 and breath sounds checked- equal and bilateral Tube secured with: Tape Dental Injury: Teeth and Oropharynx as per pre-operative assessment     Left External Jugular IV 1745 Patient has no easy accessible veinous access. L neck prepped. 1% Xylocaine locally. #20 Jelco catheter inserted to L external jugular vein.  Arta Bruce MD

## 2012-02-19 ENCOUNTER — Telehealth: Payer: Self-pay | Admitting: Vascular Surgery

## 2012-02-19 NOTE — Telephone Encounter (Signed)
Message copied by Sara Chu on Thu Feb 19, 2012 10:09 AM ------      Message from: Fabienne Bruns E      Created: Wed Feb 18, 2012  8:26 PM       Thrombectomy and Revision right thigh graft      Roczniak asst            Needs follow up in 2 weeks            Leonette Most

## 2012-02-19 NOTE — Telephone Encounter (Signed)
Left message for patient with appt date/time

## 2012-02-23 ENCOUNTER — Telehealth: Payer: Self-pay | Admitting: Vascular Surgery

## 2012-02-23 NOTE — Telephone Encounter (Signed)
Hazel, pts care giver called, wanted to r/s [facility] 05/16 appt because it was a dialysis day. Informed that [facility] is only in clinic on Thursdays. Mr Noseworthy starts dialysis at 1130am, so we scheduled for 830am to get pt to dialysis on time. Jerrye Beavers will inform pt sister of time, dpm

## 2012-02-26 NOTE — Discharge Summary (Signed)
[facility] Discharge Summary   Patient ID:  [name] MRN: [id number] DOB/AGE: 1950-12-11 61 y.o.  Admit date: 02/18/2012 Discharge date: 02/18/12 Date of Surgery: 02/18/2012 Surgeon: Surgeon(s): Sherren Kerns, MD  Admission Diagnosis: Renal Disease  Discharge Diagnoses:  Renal Disease  Secondary Diagnoses: Past Medical History  Diagnosis Date  . Upper respiratory infection 12/12/2009  . Heart murmur, systolic 08/10/2009  . 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 Hypotension 05/26/2009  . Syncope 05/07/2009  . 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 Esophageal varices 11/07/2008  . Redness or discharge of eye 11/06/2008  . Skin tag 08/25/2008  . Gastric ulcer 10/09    antral, with h pylori positive  . Abdominal pain, acute, generalized 06/28/2008  . Congestive heart failure 03/06/2008  . Cellulitis and abscess of leg, except foot 03/06/2008  . Sinus tachycardia 03/06/2008  . Edema 03/06/2008  . Impacted cerumen of both ears 03/06/2008  . Secondary hyperparathyroidism 02/02/2008  . Mute 02/02/2008  . Mental retardation 02/02/2008  . Renal failure 02/02/2008  . Hypertension 02/02/2008  . Hyperlipidemia 02/02/2008  . Anemia 02/02/2008  . ESRD (end stage renal disease)     Procedure(s): THROMBECTOMY AND REVISION OF ARTERIOVENTOUS (AV) GORETEX  GRAFT  Discharged Condition: good  HPI:  [name] is a 62 y.o. male who had Clotted right thigh graft  Pt. is Mentally slow and could not have declot of graft in IR due to combativeness He was brought to the OR for thrombectomy and revision of right thigh graft     Hospital Course:  [name] is a 61 y.o. male is S/P Right Procedure(s): THROMBECTOMY AND REVISION OF ARTERIOVENTOUS (AV) GORETEX  GRAFT Extubated: POD # 0 Post-op wounds healing well Pt. D/C to home the same day Pt pain controlled with PO pain meds. Labs as below Complications:none  Consults:     Significant Diagnostic  Studies: CBC Lab Results  Component Value Date   WBC 10.9* 12/16/2010   HGB 12.6* 02/18/2012   HCT 37.0* 02/18/2012   MCV 100.0 12/16/2010   PLT 125* 12/16/2010    BMET    Component Value Date/Time   NA 148* 02/18/2012 1821   K 3.9 02/18/2012 1821   CL 103 12/16/2010 1300   CO2 25 12/16/2010 1300   GLUCOSE 109* 10/15/2011 1049   BUN 74* 12/16/2010 1300   CREATININE 6.67* 12/16/2010 1300   CALCIUM 9.5 12/16/2010 1300   GFRNONAA 9* 12/16/2010 1300   GFRAA  Value: 10        The eGFR has been calculated using the MDRD equation. This calculation has not been validated in all clinical situations. eGFR's persistently <60 mL/min signify possible Chronic Kidney Disease.* 12/16/2010 1300   COAG Lab Results  Component Value Date   INR 1.43 11/24/2010   INR 1.88* 11/24/2010   INR 1.31 12/30/2009     Disposition:  Discharge to :Home Discharge Orders    Future Appointments: Provider: Department: Dept Phone: Center:   03/04/2012 8:30 AM Sherren Kerns, MD [facility] [phone] [facility]     Future Orders Please Complete By Expires   Resume previous diet      Call MD for:  temperature >100.5      Call MD for:  redness, tenderness, or signs of infection (pain, swelling, bleeding, redness, odor or green/yellow discharge around incision site)      Call MD for:  severe or increased pain, loss or decreased feeling  in affected limb(s)  Increase activity slowly      Comments:   Walk with assistance use walker or cane as needed   May shower       Scheduling Instructions:   Saturday   Remove dressing in 48 hours         Deyon, Chizek  Home Medication Instructions AVW:[id number]   Printed on:02/26/12 1342  Medication Information                    calcium gluconate 500 MG tablet Take 500 mg by mouth daily.             epoetin alfa (EPOGEN,PROCRIT) 3000 UNIT/ML injection Inject 3,400 Units into the vein 3 (three) times a week.             paricalcitol (ZEMPLAR) 2 MCG/ML injection Inject 3 mcg  into the vein. Tuesday, Thursday, Saturday with dialysis            calcium elemental as carbonate (TUMS ULTRA 1000) 400 MG tablet Chew 1,000 mg by mouth 3 (three) times daily. Take with meals            atorvastatin (LIPITOR) 20 MG tablet Take 1 tablet (20 mg total) by mouth at bedtime.           midodrine (PROAMATINE) 10 MG tablet Take 15 mg by mouth 2 (two) times daily.             B Complex-C-Folic Acid (DIALYVITE TABLET) TABS TAKE ONE TABLET EVERY DAY           clonazePAM (KLONOPIN) 0.5 MG tablet TAKE 1/2 TABLET TWICE DAILY           sevelamer (RENAGEL) 800 MG tablet Take 2 tablets (1,600 mg total) by mouth 3 (three) times daily with meals.           pantoprazole (PROTONIX) 40 MG tablet TAKE ONE TABLET TWICE DAILY           oxyCODONE (ROXICODONE) 5 MG immediate release tablet Take 1 tablet (5 mg total) by mouth every 4 (four) hours as needed for pain.            Verbal and written Discharge instructions given to the patient. Wound care per Discharge AVS Follow-up Information    Follow up with [facility]. (As needed)    Contact information:   [address] [place] [address] [phone]         Signed: Marlowe Shores 02/26/2012, 1:42 PM

## 2012-03-03 ENCOUNTER — Encounter: Payer: Self-pay | Admitting: Vascular Surgery

## 2012-03-04 ENCOUNTER — Ambulatory Visit (INDEPENDENT_AMBULATORY_CARE_PROVIDER_SITE_OTHER): Payer: Medicare Other | Admitting: Vascular Surgery

## 2012-03-04 ENCOUNTER — Encounter: Payer: Self-pay | Admitting: Vascular Surgery

## 2012-03-04 VITALS — BP 109/60 | HR 86 | Temp 97.3°F | Ht 62.0 in | Wt 136.0 lb

## 2012-03-04 DIAGNOSIS — L98499 Non-pressure chronic ulcer of skin of other sites with unspecified severity: Secondary | ICD-10-CM

## 2012-03-04 DIAGNOSIS — I7025 Atherosclerosis of native arteries of other extremities with ulceration: Secondary | ICD-10-CM | POA: Insufficient documentation

## 2012-03-04 DIAGNOSIS — N186 End stage renal disease: Secondary | ICD-10-CM

## 2012-03-04 DIAGNOSIS — I739 Peripheral vascular disease, unspecified: Secondary | ICD-10-CM

## 2012-03-04 NOTE — Progress Notes (Signed)
Patient returns today for followup after thrombectomy and revision of his right thigh graft. His mother reports that there is no drainage from the incision. She has been cleaning this with hydrogen peroxide. The graft is apparently been working okay on dialysis.  Physical exam: Right groin incision healing well with small area of maceration at the inferior edge approximately 1 cm, audible bruit and graft  Assessment: Healing right groin incision with functioning right thigh graft  Plan: Followup on as-needed basis No further hydrogen peroxide, dry dressing when necessary   Fabienne Bruns, MD [facility] Office: [phone] Pager: [phone]

## 2012-03-09 ENCOUNTER — Emergency Department (HOSPITAL_COMMUNITY)
Admission: EM | Admit: 2012-03-09 | Discharge: 2012-03-09 | Disposition: A | Discharge status: Home / Self Care | Payer: Medicare Other | Attending: Emergency Medicine | Admitting: Emergency Medicine

## 2012-03-09 DIAGNOSIS — E785 Hyperlipidemia, unspecified: Secondary | ICD-10-CM | POA: Insufficient documentation

## 2012-03-09 DIAGNOSIS — F79 Unspecified intellectual disabilities: Secondary | ICD-10-CM | POA: Insufficient documentation

## 2012-03-09 DIAGNOSIS — I120 Hypertensive chronic kidney disease with stage 5 chronic kidney disease or end stage renal disease: Secondary | ICD-10-CM | POA: Insufficient documentation

## 2012-03-09 DIAGNOSIS — R4701 Aphasia: Secondary | ICD-10-CM | POA: Insufficient documentation

## 2012-03-09 DIAGNOSIS — Z992 Dependence on renal dialysis: Secondary | ICD-10-CM | POA: Insufficient documentation

## 2012-03-09 DIAGNOSIS — N186 End stage renal disease: Secondary | ICD-10-CM | POA: Insufficient documentation

## 2012-03-09 DIAGNOSIS — T829XXA Unspecified complication of cardiac and vascular prosthetic device, implant and graft, initial encounter: Secondary | ICD-10-CM

## 2012-03-09 DIAGNOSIS — I509 Heart failure, unspecified: Secondary | ICD-10-CM | POA: Insufficient documentation

## 2012-03-09 DIAGNOSIS — T82898A Other specified complication of vascular prosthetic devices, implants and grafts, initial encounter: Secondary | ICD-10-CM | POA: Insufficient documentation

## 2012-03-09 DIAGNOSIS — Y841 Kidney dialysis as the cause of abnormal reaction of the patient, or of later complication, without mention of misadventure at the time of the procedure: Secondary | ICD-10-CM | POA: Insufficient documentation

## 2012-03-09 NOTE — ED Notes (Signed)
Per EMS pt from home with c/o bleeding from fistula in right leg. Pt had full dialysis treatment today, went home, removed dressing and began to bleed. EMS noted approx 50 cc blood on scene. Bleeding controlled. Pt non-verbal, mental status normal per caregiver at home. VSS. CBG 64. BP 118/60 HR 122. Pt follows commands.

## 2012-03-09 NOTE — ED Notes (Signed)
Patient is mentally handicapped (consistent with preadmission baseline).  His sister/guardian is AOx4 and comfortable with his discharge instructions.

## 2012-03-09 NOTE — ED Notes (Signed)
Veva Holes (sister)- [phone]

## 2012-03-09 NOTE — ED Notes (Signed)
Caregiver to bedside. States site began to bleed when pt came home from dialysis. No bleeding noted at this time. Pt combative towards staff, refused to get undressed or wear arm bracelet. MD at bedside.

## 2012-03-09 NOTE — ED Provider Notes (Signed)
History     CSN: [id number]  Arrival date & time 03/09/12  1712   First MD Initiated Contact with Patient 03/09/12 1728      Chief Complaint  Patient presents with  . Vascular Access Problem     HPI Per EMS pt from home with c/o bleeding from fistula in right leg. Pt had full dialysis treatment today, went home, removed dressing and began to bleed. EMS noted approx 50 cc blood on scene. Bleeding controlled. Pt non-verbal, mental status normal per caregiver at home. VSS. CBG 64. BP 118/60 HR 122. Pt follows commands.  Past Medical History  Diagnosis Date  . Upper respiratory infection 12/12/2009  . Heart murmur, systolic 08/10/2009  . 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 Hypotension 05/26/2009  . Syncope 05/07/2009  . 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 Esophageal varices 11/07/2008  . Redness or discharge of eye 11/06/2008  . Skin tag 08/25/2008  . Gastric ulcer 10/09    antral, with h pylori positive  . Abdominal pain, acute, generalized 06/28/2008  . Congestive heart failure 03/06/2008  . Cellulitis and abscess of leg, except foot 03/06/2008  . Sinus tachycardia 03/06/2008  . Edema 03/06/2008  . Impacted cerumen of both ears 03/06/2008  . Secondary hyperparathyroidism 02/02/2008  . Mute 02/02/2008  . Mental retardation 02/02/2008  . Renal failure 02/02/2008  . Hypertension 02/02/2008  . Hyperlipidemia 02/02/2008  . Anemia 02/02/2008  . ESRD (end stage renal disease)     Past Surgical History  Procedure Date  . Left forearm graft     for HD  . Arteriovenous graft placement 11/22/10    Right thigh AVG    No family history on file.  History  Substance Use Topics  . Smoking status: Never Smoker   . Smokeless tobacco: Not on file  . Alcohol Use: No      Review of Systems  Unable to perform ROS   Allergies  Ace inhibitors  Home Medications   Current Outpatient Rx  Name Route Sig Dispense Refill  . ATORVASTATIN CALCIUM 20 MG PO TABS Oral Take 20 mg by  mouth at bedtime.    Marland Kitchen CLONAZEPAM 0.5 MG PO TABS Oral Take 0.25 mg by mouth 2 (two) times daily.    Marland Kitchen DIALYVITE 3000 3 MG PO TABS Oral Take 1 tablet by mouth daily.    Marland Kitchen MIDODRINE HCL 10 MG PO TABS Oral Take 15 mg by mouth 2 (two) times daily.      Marland Kitchen PANTOPRAZOLE SODIUM 40 MG PO TBEC Oral Take 40 mg by mouth 2 (two) times daily.    Marland Kitchen SEVELAMER HCL 800 MG PO TABS Oral Take 1,600 mg by mouth 3 (three) times daily with meals.      BP 119/60  Pulse 113  Resp 20  SpO2 98%  Physical Exam  Nursing note and vitals reviewed. Constitutional: He appears well-developed and well-nourished. No distress.  HENT:  Head: Normocephalic and atraumatic.  Eyes: Pupils are equal, round, and reactive to light.  Neck: Normal range of motion.  Cardiovascular: Normal rate and intact distal pulses.   Pulmonary/Chest: No respiratory distress.  Abdominal: Normal appearance. He exhibits no distension.  Musculoskeletal: Normal range of motion.       Legs: Neurological: He is alert. No cranial nerve deficit.  Skin: Skin is warm and dry. No rash noted.  Psychiatric: He has a normal mood and affect. His behavior is normal.    ED Course  Procedures (including critical care time)  Labs  Reviewed - No data to display No results found.   1. Complication of arteriovenous dialysis fistula       MDM  Patient refused any lab draw.  Patient was observed in the emergency department.  No further bleeding.  Family was given quick clot pad instructed on use if bleeding returns.      Nelia Shi, MD 03/09/12 [phone]

## 2012-03-11 ENCOUNTER — Other Ambulatory Visit: Payer: Self-pay | Admitting: *Deleted

## 2012-03-12 MED ORDER — ATORVASTATIN CALCIUM 20 MG PO TABS: 20.0000 mg | ORAL_TABLET | Freq: Every day | ORAL | Status: DC

## 2012-04-12 ENCOUNTER — Other Ambulatory Visit: Payer: Self-pay | Admitting: Internal Medicine

## 2012-05-21 ENCOUNTER — Other Ambulatory Visit: Payer: Self-pay | Admitting: Internal Medicine

## 2012-05-21 NOTE — Telephone Encounter (Signed)
Unclear indication for BID dosing of pantoprazole.  Will decrease to once a day dosing and assess symptoms.

## 2012-06-11 ENCOUNTER — Other Ambulatory Visit: Payer: Self-pay | Admitting: Internal Medicine

## 2012-06-11 NOTE — Telephone Encounter (Signed)
Rx called in to pharmacy. 

## 2012-07-09 ENCOUNTER — Other Ambulatory Visit: Payer: Self-pay | Admitting: *Deleted

## 2012-07-09 NOTE — Telephone Encounter (Signed)
Refill is for 1/2 tablet bid

## 2012-07-09 NOTE — Telephone Encounter (Signed)
Pharmacy informed.

## 2012-07-14 ENCOUNTER — Ambulatory Visit (INDEPENDENT_AMBULATORY_CARE_PROVIDER_SITE_OTHER): Payer: Medicare Other | Admitting: Internal Medicine

## 2012-07-14 ENCOUNTER — Encounter: Payer: Self-pay | Admitting: Internal Medicine

## 2012-07-14 VITALS — BP 113/65 | HR 79 | Temp 97.8°F | Ht 62.0 in | Wt 134.4 lb

## 2012-07-14 DIAGNOSIS — K259 Gastric ulcer, unspecified as acute or chronic, without hemorrhage or perforation: Secondary | ICD-10-CM

## 2012-07-14 DIAGNOSIS — G40909 Epilepsy, unspecified, not intractable, without status epilepticus: Secondary | ICD-10-CM | POA: Insufficient documentation

## 2012-07-14 DIAGNOSIS — D509 Iron deficiency anemia, unspecified: Secondary | ICD-10-CM

## 2012-07-14 DIAGNOSIS — R569 Unspecified convulsions: Secondary | ICD-10-CM

## 2012-07-14 MED ORDER — PANTOPRAZOLE SODIUM 40 MG PO TBEC: 40.0000 mg | DELAYED_RELEASE_TABLET | Freq: Every day | ORAL | Status: DC

## 2012-07-14 MED ORDER — CLONAZEPAM 0.5 MG PO TABS: 0.2500 mg | ORAL_TABLET | Freq: Two times a day (BID) | ORAL | Status: DC

## 2012-07-14 NOTE — Assessment & Plan Note (Addendum)
Unclear etiology. Per caregiver's report, patient has minor seizures 1-2 episodes in the past month.  She reports that he started having seizures since starting dialysis in 2005.  He has been taking Clonazepam 0.25mg  bid. -Will continue Clonazepam 0.25mg  bid -Refer to Neurology

## 2012-07-14 NOTE — Patient Instructions (Addendum)
We will refer you to a neurologist for your seizure Will get labs today and we will call you with any abnormal lab results Follow up with your primary care physician in 4-6 weeks

## 2012-07-14 NOTE — Addendum Note (Signed)
Addended by: Remus Blake on: 07/14/2012 03:28 PM   Modules accepted: Orders

## 2012-07-14 NOTE — Progress Notes (Signed)
HPI: [name] is a 61 yo M with PMH of MR, HLP, iron def anemia, CHF, ESRD on HD T/T/Sat,  presents today for seizure 1-2 epidpast 2-3 months (seizure since dialysis in 2005). He has been on Klonopin for seizure, unclear etiology of seizure.  He has been off of Klonopin in the past few days because no refill. No GERD symptoms but patient is mute.  Family is also concerned to why Protonix was cut down to 1 tab daily instead of bid.  No other complaints.  ROS: as per HPI  PE: General: alert, well-developed, and cooperative to examination.  Lungs: normal respiratory effort, no accessory muscle use, normal breath sounds, no crackles, and no wheezes. Heart: normal rate, regular rhythm, + systolic murmur best heard at Lower left sternal border, no gallop, and no rub.  Abdomen: soft, non-tender, normal bowel sounds, no distention, no guarding, no rebound tenderness Msk: no joint swelling, no joint warmth, and no redness over joints. Right thigh graft: +thrills Pulses: 2+ DP/PT pulses bilaterally Extremities: No cyanosis, clubbing, edema  Psych: MR, mute

## 2012-07-14 NOTE — Assessment & Plan Note (Addendum)
Patient had GI bleed in 2012 on EGD.  Dr. Josem Kaufmann has decreased his PPI to qd instead of BID because there is no indication for BID as patient is asymptomatic at this time.   -Will repeat CBC today-patient refused blood drawn.  I wrote a Rx and gave to caregiver for blood drawn for CBC and anemia panel during dialysis. -Continue Protonix 40mg  qd

## 2012-10-07 ENCOUNTER — Other Ambulatory Visit: Payer: Self-pay | Admitting: Internal Medicine

## 2012-10-21 ENCOUNTER — Encounter (HOSPITAL_COMMUNITY): Payer: Self-pay | Admitting: *Deleted

## 2012-10-21 ENCOUNTER — Other Ambulatory Visit: Payer: Self-pay | Admitting: *Deleted

## 2012-10-22 ENCOUNTER — Ambulatory Visit (HOSPITAL_COMMUNITY): Payer: Medicare Other | Admitting: Anesthesiology

## 2012-10-22 ENCOUNTER — Encounter (HOSPITAL_COMMUNITY): Payer: Self-pay | Admitting: *Deleted

## 2012-10-22 ENCOUNTER — Ambulatory Visit (HOSPITAL_COMMUNITY)
Admission: RE | Admit: 2012-10-22 | Discharge: 2012-10-22 | Disposition: A | Discharge status: Home / Self Care | Payer: Medicare Other | Source: Ambulatory Visit | Attending: Vascular Surgery | Admitting: Vascular Surgery

## 2012-10-22 ENCOUNTER — Encounter (HOSPITAL_COMMUNITY)
Admission: RE | Disposition: A | Discharge status: Home / Self Care | Payer: Self-pay | Source: Ambulatory Visit | Attending: Vascular Surgery

## 2012-10-22 ENCOUNTER — Encounter (HOSPITAL_COMMUNITY): Payer: Self-pay | Admitting: Anesthesiology

## 2012-10-22 ENCOUNTER — Ambulatory Visit (HOSPITAL_COMMUNITY): Payer: Medicare Other

## 2012-10-22 DIAGNOSIS — T82898A Other specified complication of vascular prosthetic devices, implants and grafts, initial encounter: Secondary | ICD-10-CM | POA: Insufficient documentation

## 2012-10-22 DIAGNOSIS — R011 Cardiac murmur, unspecified: Secondary | ICD-10-CM | POA: Insufficient documentation

## 2012-10-22 DIAGNOSIS — I120 Hypertensive chronic kidney disease with stage 5 chronic kidney disease or end stage renal disease: Secondary | ICD-10-CM | POA: Insufficient documentation

## 2012-10-22 DIAGNOSIS — F79 Unspecified intellectual disabilities: Secondary | ICD-10-CM | POA: Insufficient documentation

## 2012-10-22 DIAGNOSIS — Z992 Dependence on renal dialysis: Secondary | ICD-10-CM | POA: Insufficient documentation

## 2012-10-22 DIAGNOSIS — I509 Heart failure, unspecified: Secondary | ICD-10-CM | POA: Insufficient documentation

## 2012-10-22 DIAGNOSIS — N2581 Secondary hyperparathyroidism of renal origin: Secondary | ICD-10-CM | POA: Insufficient documentation

## 2012-10-22 DIAGNOSIS — Z8711 Personal history of peptic ulcer disease: Secondary | ICD-10-CM | POA: Insufficient documentation

## 2012-10-22 DIAGNOSIS — N186 End stage renal disease: Secondary | ICD-10-CM | POA: Insufficient documentation

## 2012-10-22 DIAGNOSIS — E785 Hyperlipidemia, unspecified: Secondary | ICD-10-CM | POA: Insufficient documentation

## 2012-10-22 DIAGNOSIS — Y832 Surgical operation with anastomosis, bypass or graft as the cause of abnormal reaction of the patient, or of later complication, without mention of misadventure at the time of the procedure: Secondary | ICD-10-CM | POA: Insufficient documentation

## 2012-10-22 HISTORY — PX: THROMBECTOMY AND REVISION OF ARTERIOVENTOUS (AV) GORETEX  GRAFT: SHX6120

## 2012-10-22 LAB — POCT I-STAT 4, (NA,K, GLUC, HGB,HCT)
HCT: 40 % (ref 39.0–52.0)
Hemoglobin: 13.6 g/dL (ref 13.0–17.0)
Potassium: 5.5 mEq/L — ABNORMAL HIGH (ref 3.5–5.1)
Sodium: 148 mEq/L — ABNORMAL HIGH (ref 135–145)

## 2012-10-22 SURGERY — THROMBECTOMY AND REVISION OF ARTERIOVENTOUS (AV) GORETEX  GRAFT
Anesthesia: General | Site: Groin | Laterality: Right | Wound class: Clean

## 2012-10-22 MED ORDER — LIDOCAINE-EPINEPHRINE 0.5 %-1:200000 IJ SOLN
INTRAMUSCULAR | Status: AC
  Filled 2012-10-22: qty 1

## 2012-10-22 MED ORDER — PHENYLEPHRINE HCL 10 MG/ML IJ SOLN
10.0000 mg | INTRAVENOUS | Status: DC | PRN
  Administered 2012-10-22: 50 ug/min via INTRAVENOUS

## 2012-10-22 MED ORDER — KETAMINE HCL 100 MG/ML IJ SOLN
INTRAMUSCULAR | Status: AC
  Filled 2012-10-22: qty 1

## 2012-10-22 MED ORDER — DEXTROSE 5 % IV SOLN
1.5000 g | INTRAVENOUS | Status: DC
  Filled 2012-10-22: qty 1.5

## 2012-10-22 MED ORDER — KETAMINE HCL 100 MG/ML IJ SOLN
INTRAMUSCULAR | Status: DC | PRN
  Administered 2012-10-22: 200 mg via INTRAMUSCULAR

## 2012-10-22 MED ORDER — SODIUM CHLORIDE 0.9 % IV SOLN
INTRAVENOUS | Status: DC | PRN
  Administered 2012-10-22: 11:00:00 via INTRAVENOUS

## 2012-10-22 MED ORDER — SODIUM CHLORIDE 0.9 % IR SOLN
Status: DC | PRN
  Administered 2012-10-22: 11:00:00

## 2012-10-22 MED ORDER — CEFAZOLIN SODIUM-DEXTROSE 2-3 GM-% IV SOLR: INTRAVENOUS | Status: DC | PRN

## 2012-10-22 MED ORDER — CEFAZOLIN SODIUM-DEXTROSE 2-3 GM-% IV SOLR
INTRAVENOUS | Status: DC | PRN
  Administered 2012-10-22: 2 g via INTRAVENOUS

## 2012-10-22 MED ORDER — MIDAZOLAM HCL 2 MG/ML PO SYRP
ORAL_SOLUTION | ORAL | Status: AC
  Filled 2012-10-22: qty 8

## 2012-10-22 MED ORDER — SODIUM CHLORIDE 0.9 % IV SOLN: INTRAVENOUS | Status: DC

## 2012-10-22 MED ORDER — PHENYLEPHRINE HCL 10 MG/ML IJ SOLN
INTRAMUSCULAR | Status: DC | PRN
  Administered 2012-10-22: 40 ug via INTRAVENOUS
  Administered 2012-10-22: 80 ug via INTRAVENOUS
  Administered 2012-10-22 (×2): 40 ug via INTRAVENOUS

## 2012-10-22 MED ORDER — MIDAZOLAM HCL 2 MG/ML PO SYRP: 15.0000 mg | ORAL_SOLUTION | Freq: Once | ORAL | Status: DC

## 2012-10-22 MED ORDER — FENTANYL CITRATE 0.05 MG/ML IJ SOLN
INTRAMUSCULAR | Status: DC | PRN
  Administered 2012-10-22 (×2): 50 ug via INTRAVENOUS

## 2012-10-22 MED ORDER — PROPOFOL 10 MG/ML IV BOLUS
INTRAVENOUS | Status: DC | PRN
  Administered 2012-10-22: 50 mg via INTRAVENOUS

## 2012-10-22 MED ORDER — OXYCODONE HCL 5 MG PO TABS: 5.0000 mg | ORAL_TABLET | ORAL | Status: DC | PRN

## 2012-10-22 MED ORDER — 0.9 % SODIUM CHLORIDE (POUR BTL) OPTIME
TOPICAL | Status: DC | PRN
  Administered 2012-10-22: 1000 mL

## 2012-10-22 MED ORDER — LIDOCAINE HCL (CARDIAC) 20 MG/ML IV SOLN
INTRAVENOUS | Status: DC | PRN
  Administered 2012-10-22: 80 mg via INTRAVENOUS

## 2012-10-22 MED ORDER — CEFAZOLIN SODIUM 1-5 GM-% IV SOLN
INTRAVENOUS | Status: AC
  Filled 2012-10-22: qty 100

## 2012-10-22 MED ORDER — CEFAZOLIN SODIUM 1-5 GM-% IV SOLN: 1.0000 g | Freq: Three times a day (TID) | INTRAVENOUS | Status: DC

## 2012-10-22 MED ORDER — MUPIROCIN 2 % EX OINT
TOPICAL_OINTMENT | Freq: Once | CUTANEOUS | Status: DC
  Filled 2012-10-22 (×2): qty 22

## 2012-10-22 SURGICAL SUPPLY — 47 items
ADH SKN CLS APL DERMABOND .7 (GAUZE/BANDAGES/DRESSINGS) ×1
APL SKNCLS STERI-STRIP NONHPOA (GAUZE/BANDAGES/DRESSINGS)
BENZOIN TINCTURE PRP APPL 2/3 (GAUZE/BANDAGES/DRESSINGS) ×1 IMPLANT
CANISTER SUCTION 2500CC (MISCELLANEOUS) ×2 IMPLANT
CATH EMB 4FR 80CM (CATHETERS) ×2 IMPLANT
CLIP LIGATING EXTRA MED SLVR (CLIP) ×2 IMPLANT
CLIP LIGATING EXTRA SM BLUE (MISCELLANEOUS) ×2 IMPLANT
CLOTH BEACON ORANGE TIMEOUT ST (SAFETY) ×2 IMPLANT
COVER SURGICAL LIGHT HANDLE (MISCELLANEOUS) ×2 IMPLANT
DECANTER SPIKE VIAL GLASS SM (MISCELLANEOUS) ×1 IMPLANT
DERMABOND ADVANCED (GAUZE/BANDAGES/DRESSINGS) ×1
DERMABOND ADVANCED .7 DNX12 (GAUZE/BANDAGES/DRESSINGS) IMPLANT
DRAPE INCISE IOBAN 66X45 STRL (DRAPES) ×1 IMPLANT
DRAPE ORTHO SPLIT 77X108 STRL (DRAPES) ×2
DRAPE SURG ORHT 6 SPLT 77X108 (DRAPES) IMPLANT
ELECT REM PT RETURN 9FT ADLT (ELECTROSURGICAL) ×2
ELECTRODE REM PT RTRN 9FT ADLT (ELECTROSURGICAL) ×1 IMPLANT
GEL ULTRASOUND 20GR AQUASONIC (MISCELLANEOUS) IMPLANT
GLOVE BIOGEL PI IND STRL 6.5 (GLOVE) IMPLANT
GLOVE BIOGEL PI IND STRL 7.5 (GLOVE) IMPLANT
GLOVE BIOGEL PI INDICATOR 6.5 (GLOVE) ×2
GLOVE BIOGEL PI INDICATOR 7.5 (GLOVE) ×1
GLOVE ECLIPSE 7.5 STRL STRAW (GLOVE) ×1 IMPLANT
GLOVE SS BIOGEL STRL SZ 7 (GLOVE) IMPLANT
GLOVE SS BIOGEL STRL SZ 7.5 (GLOVE) ×1 IMPLANT
GLOVE SUPERSENSE BIOGEL SZ 7 (GLOVE) ×1
GLOVE SUPERSENSE BIOGEL SZ 7.5 (GLOVE) ×1
GLOVE SURG SS PI 7.0 STRL IVOR (GLOVE) ×1 IMPLANT
GOWN PREVENTION PLUS XLARGE (GOWN DISPOSABLE) ×2 IMPLANT
GOWN STRL NON-REIN LRG LVL3 (GOWN DISPOSABLE) ×5 IMPLANT
GOWN STRL REIN XL XLG (GOWN DISPOSABLE) ×1 IMPLANT
KIT BASIN OR (CUSTOM PROCEDURE TRAY) ×2 IMPLANT
KIT ROOM TURNOVER OR (KITS) ×2 IMPLANT
NS IRRIG 1000ML POUR BTL (IV SOLUTION) ×2 IMPLANT
PACK CV ACCESS (CUSTOM PROCEDURE TRAY) ×2 IMPLANT
PAD ARMBOARD 7.5X6 YLW CONV (MISCELLANEOUS) ×4 IMPLANT
SPONGE GAUZE 4X4 12PLY (GAUZE/BANDAGES/DRESSINGS) ×2 IMPLANT
STRIP CLOSURE SKIN 1/2X4 (GAUZE/BANDAGES/DRESSINGS) ×1 IMPLANT
SUT PROLENE 6 0 CC (SUTURE) ×2 IMPLANT
SUT VIC AB 2-0 CTX 36 (SUTURE) ×1 IMPLANT
SUT VIC AB 3-0 SH 27 (SUTURE) ×2
SUT VIC AB 3-0 SH 27X BRD (SUTURE) ×1 IMPLANT
TAPE CLOTH SURG 4X10 WHT LF (GAUZE/BANDAGES/DRESSINGS) ×1 IMPLANT
TOWEL OR 17X24 6PK STRL BLUE (TOWEL DISPOSABLE) ×2 IMPLANT
TOWEL OR 17X26 10 PK STRL BLUE (TOWEL DISPOSABLE) ×2 IMPLANT
UNDERPAD 30X30 INCONTINENT (UNDERPADS AND DIAPERS) ×2 IMPLANT
WATER STERILE IRR 1000ML POUR (IV SOLUTION) ×2 IMPLANT

## 2012-10-22 NOTE — Progress Notes (Signed)
Spoke with Dr Jacklynn Bue and informed him that pt refuses to take the versed.  Informed him that pt has been marked ready. Also informed holding area.

## 2012-10-22 NOTE — OR Nursing (Signed)
Patient's white brief underwear removed pre-operatively and placed in biohazard bag with patient's label. Will transfer to PACU with patient post-operatively.

## 2012-10-22 NOTE — Progress Notes (Signed)
Patient refuses to take po versed.

## 2012-10-22 NOTE — Op Note (Signed)
OPERATIVE REPORT  DATE OF SURGERY: 10/22/2012  PATIENT: [name], 62 y.o. male MRN: [id number]  DOB: 05-29-1951  PRE-OPERATIVE DIAGNOSIS: End-stage renal disease with occluded right femoral loop AV Gore-Tex graft  POST-OPERATIVE DIAGNOSIS:  Same  PROCEDURE: Thrombectomy of right femoral loop AV Gore-Tex graft  SURGEON:  Gretta Began, M.D.  PHYSICIAN ASSISTANT: Collin  ANESTHESIA:  Gen.  EBL: Minimal ml  Total I/O In: 300 [I.V.:300] Out: -   BLOOD ADMINISTERED: None  DRAINS: None  SPECIMEN: None  COUNTS CORRECT:  YES  PLAN OF CARE: PACU   PATIENT DISPOSITION:  PACU - hemodynamically stable  PROCEDURE DETAILS: Patient was taken to the upper and placed supine position where the area the right groin was prepped and draped in usual sterile fashion. Incision was made over the prior scar and carried down to isolate the Gore-Tex graft to femoral vein anastomosis. The patient had a revision in May to jump higher on the vein. There was dense scarring throughout. The graft was opened at the prior revision site with the graft to graft anastomosis taken down. The venous anastomosis was thrombectomized and 5 dilator passed with no resistance to the venous anastomosis. There was good venous backbleeding this was flushed with heparinized saline and reoccluded. Next the graft itself was thrombectomized. The arterialized plug was removed and excellent flow was encountered. This was flushed with heparinized and reoccluded. There were some irregularity in the graft at the puncture sites. Long dilators were passed through the prior area of this access on the venous side and there was no evidence of stenosis. The graft was flushed with heparinized and reoccluded. The graft was sewn end-to-end to the interposition portion near the venous anastomosis with a running 6-0 Prolene suture. Clamps removed and excellent thrill was noted. Wound irrigated with saline. Hemostasis with cautery. Wounds were closed  with 2-0 Vicryl in several layers the saphenous tissue and skin was closed with 30 septic Vicryl stitch sterile dressing was applied   Gretta Began, M.D. 10/22/2012 12:34 PM

## 2012-10-22 NOTE — Preoperative (Signed)
Beta Blockers   Reason not to administer Beta Blockers:Not Applicable 

## 2012-10-22 NOTE — Progress Notes (Addendum)
Notified dr. Jacklynn Bue regarding not being able to draw any labs or get vital signs on patient due to being combative. No orders received.  Dr. Jacklynn Bue called back and ordered to give 15mg  of po versed if patient will allow.

## 2012-10-22 NOTE — Anesthesia Preprocedure Evaluation (Addendum)
Anesthesia Evaluation  Patient identified by MRN, date of birth, ID band Patient awake and Patient confused    Reviewed: Allergy & Precautions, H&P , NPO status , Patient's Chart, lab work & pertinent test results  Airway   Neck ROM: Full   Comment: Unable to assess, Pt with MR and Mute, uncooperative Dental  (+) Poor Dentition   Pulmonary  breath sounds clear to auscultation        Cardiovascular hypertension, Pt. on medications +CHF Rhythm:Regular Rate:Normal     Neuro/Psych PSYCHIATRIC DISORDERS    GI/Hepatic PUD,   Endo/Other    Renal/GU CRFRenal disease     Musculoskeletal   Abdominal   Peds  Hematology   Anesthesia Other Findings Pt unable to cooperate for physical exam secondary to MR Will plan induction in OR or Ketamine upon start of anesthesia  Reproductive/Obstetrics                           Anesthesia Physical Anesthesia Plan  ASA: III  Anesthesia Plan: General   Post-op Pain Management:    Induction: Inhalational  Airway Management Planned: LMA  Additional Equipment:   Intra-op Plan:   Post-operative Plan: Extubation in OR  Informed Consent: I have reviewed the patients History and Physical, chart, labs and discussed the procedure including the risks, benefits and alternatives for the proposed anesthesia with the patient or authorized representative who has indicated his/her understanding and acceptance.   Plan/risks discussed with: Discussed plan c caregiver.  Plan Discussed with: CRNA and Surgeon  Anesthesia Plan Comments:         Anesthesia Quick Evaluation

## 2012-10-22 NOTE — Anesthesia Postprocedure Evaluation (Signed)
  Anesthesia Post-op Note  Patient: [name]  Procedure(s) Performed: Procedure(s) (LRB) with comments: THROMBECTOMY AND REVISION OF ARTERIOVENTOUS (AV) GORETEX  GRAFT (Right)  Patient Location: PACU  Anesthesia Type:General  Level of Consciousness: awake and sedated  Airway and Oxygen Therapy: Patient Spontanous Breathing  Post-op Pain: mild  Post-op Assessment: Post-op Vital signs reviewed  Post-op Vital Signs: stable  Complications: No apparent anesthesia complications

## 2012-10-22 NOTE — Transfer of Care (Signed)
Immediate Anesthesia Transfer of Care Note  Patient: [name]  Procedure(s) Performed: Procedure(s) (LRB) with comments: THROMBECTOMY AND REVISION OF ARTERIOVENTOUS (AV) GORETEX  GRAFT (Right)  Patient Location: PACU  Anesthesia Type:General  Level of Consciousness: awake, alert  and oriented  Airway & Oxygen Therapy: Patient Spontanous Breathing and Patient connected to nasal cannula oxygen  Post-op Assessment: Report given to PACU RN, Post -op Vital signs reviewed and stable and Patient moving all extremities  Post vital signs: Reviewed and stable  Complications: No apparent anesthesia complications

## 2012-10-22 NOTE — H&P (Signed)
  Patient is a long history of incisional disease here today for thrombectomy of right femoral loop AV Gore-Tex graft. He has known superior vena caval syndrome. He did undergo successful thrombectomy and revision on 02/18/2012 has not had a thrombosed he since then. Does have difficulty due to severe mental retardation making anesthesia more difficult.  Past Medical History  Diagnosis Date  . Upper respiratory infection 12/12/2009  . Heart murmur, systolic 08/10/2009  . 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 Hypotension 05/26/2009  . Syncope 05/07/2009  . 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 Esophageal varices 11/07/2008  . Redness or discharge of eye 11/06/2008  . Skin tag 08/25/2008  . Gastric ulcer 10/09    antral, with h pylori positive  . Abdominal pain, acute, generalized 06/28/2008  . Congestive heart failure 03/06/2008  . Cellulitis and abscess of leg, except foot 03/06/2008  . Sinus tachycardia 03/06/2008  . Edema 03/06/2008  . Impacted cerumen of both ears 03/06/2008  . Secondary hyperparathyroidism 02/02/2008  . Mute 02/02/2008  . Mental retardation 02/02/2008  . Renal failure 02/02/2008  . Hypertension 02/02/2008  . Hyperlipidemia 02/02/2008  . Anemia 02/02/2008  . ESRD (end stage renal disease)     History  Substance Use Topics  . Smoking status: Never Smoker   . Smokeless tobacco: Not on file  . Alcohol Use: No    History reviewed. No pertinent family history.  Allergies  Allergen Reactions  . Ace Inhibitors     Current facility-administered medications:0.9 %  sodium chloride infusion, , Intravenous, Continuous, Kristen Loader Trequan Marsolek, MD;  cefUROXime (ZINACEF) 1.5 g in dextrose 5 % 50 mL IVPB, 1.5 g, Intravenous, 30 min Pre-Op, Kristen Loader Jerre Diguglielmo, MD;  midazolam (VERSED) 2 MG/ML syrup 15 mg, 15 mg, Oral, Once, Josepha Pigg, MD;  mupirocin ointment (BACTROBAN) 2 %, , Nasal, Once, Larina Earthly, MD  BP 116/73  Pulse 81  Temp 97.5 F (36.4 C) (Oral)  Resp 18  SpO2  95%  There is no height or weight on file to calculate BMI.       Well-developed gentleman in no acute distress Respirations nonlabored Mental retardation Right femoral loop graft with no flow Skin without ulcers or rashes  Impression and plan an occluded right femoral loop graft for surgical revision.

## 2012-10-25 ENCOUNTER — Encounter (HOSPITAL_COMMUNITY): Payer: Self-pay | Admitting: Vascular Surgery

## 2012-11-09 ENCOUNTER — Other Ambulatory Visit: Payer: Self-pay

## 2012-11-09 ENCOUNTER — Encounter (HOSPITAL_COMMUNITY): Payer: Self-pay | Admitting: Pharmacy Technician

## 2012-11-09 ENCOUNTER — Encounter (HOSPITAL_COMMUNITY): Payer: Self-pay | Admitting: Surgery

## 2012-11-09 MED ORDER — DEXTROSE 5 % IV SOLN
1.5000 g | INTRAVENOUS | Status: AC
  Administered 2012-11-10: 1.5 g via INTRAVENOUS
  Filled 2012-11-09: qty 1.5

## 2012-11-09 NOTE — Progress Notes (Signed)
Contacted patients care provider Carroll Sage to notify of new arrival time 6:00 per Dr. Hart Rochester,  Ms. Malen Gauze stated would not be able to make a 6:00am arrival time due to transportation arrangements already being set up.  Notified Rhonda in [place]

## 2012-11-10 ENCOUNTER — Ambulatory Visit (HOSPITAL_COMMUNITY): Payer: Medicare Other | Admitting: Critical Care Medicine

## 2012-11-10 ENCOUNTER — Encounter (HOSPITAL_COMMUNITY): Payer: Self-pay | Admitting: *Deleted

## 2012-11-10 ENCOUNTER — Encounter (HOSPITAL_COMMUNITY): Payer: Self-pay | Admitting: Critical Care Medicine

## 2012-11-10 ENCOUNTER — Encounter (HOSPITAL_COMMUNITY)
Admission: RE | Disposition: A | Discharge status: Home / Self Care | Payer: Self-pay | Source: Ambulatory Visit | Attending: Vascular Surgery

## 2012-11-10 ENCOUNTER — Ambulatory Visit (HOSPITAL_COMMUNITY)
Admission: RE | Admit: 2012-11-10 | Discharge: 2012-11-10 | Disposition: A | Discharge status: Home / Self Care | Payer: Medicare Other | Source: Ambulatory Visit | Attending: Vascular Surgery | Admitting: Vascular Surgery

## 2012-11-10 DIAGNOSIS — Y832 Surgical operation with anastomosis, bypass or graft as the cause of abnormal reaction of the patient, or of later complication, without mention of misadventure at the time of the procedure: Secondary | ICD-10-CM | POA: Insufficient documentation

## 2012-11-10 DIAGNOSIS — T82898A Other specified complication of vascular prosthetic devices, implants and grafts, initial encounter: Secondary | ICD-10-CM | POA: Insufficient documentation

## 2012-11-10 DIAGNOSIS — I509 Heart failure, unspecified: Secondary | ICD-10-CM | POA: Insufficient documentation

## 2012-11-10 DIAGNOSIS — E785 Hyperlipidemia, unspecified: Secondary | ICD-10-CM | POA: Insufficient documentation

## 2012-11-10 DIAGNOSIS — N186 End stage renal disease: Secondary | ICD-10-CM | POA: Insufficient documentation

## 2012-11-10 DIAGNOSIS — N2581 Secondary hyperparathyroidism of renal origin: Secondary | ICD-10-CM | POA: Insufficient documentation

## 2012-11-10 DIAGNOSIS — I120 Hypertensive chronic kidney disease with stage 5 chronic kidney disease or end stage renal disease: Secondary | ICD-10-CM | POA: Insufficient documentation

## 2012-11-10 HISTORY — PX: THROMBECTOMY W/ EMBOLECTOMY: SHX2507

## 2012-11-10 HISTORY — DX: Gastro-esophageal reflux disease without esophagitis: K21.9

## 2012-11-10 HISTORY — DX: Unspecified convulsions: R56.9

## 2012-11-10 LAB — POCT I-STAT 4, (NA,K, GLUC, HGB,HCT): Sodium: 152 mEq/L — ABNORMAL HIGH (ref 135–145)

## 2012-11-10 SURGERY — THROMBECTOMY ARTERIOVENOUS GORE-TEX GRAFT
Anesthesia: General | Site: Leg Upper | Laterality: Right | Wound class: Clean

## 2012-11-10 MED ORDER — PHENYLEPHRINE HCL 10 MG/ML IJ SOLN
10.0000 mg | INTRAVENOUS | Status: DC | PRN
  Administered 2012-11-10: 25 ug/min via INTRAVENOUS

## 2012-11-10 MED ORDER — SODIUM CHLORIDE 0.9 % IV SOLN: INTRAVENOUS | Status: DC

## 2012-11-10 MED ORDER — PHENYLEPHRINE HCL 10 MG/ML IJ SOLN
INTRAMUSCULAR | Status: DC | PRN
  Administered 2012-11-10 (×3): 80 ug via INTRAVENOUS
  Administered 2012-11-10: 120 ug via INTRAVENOUS

## 2012-11-10 MED ORDER — ALBUMIN HUMAN 5 % IV SOLN
INTRAVENOUS | Status: DC | PRN
  Administered 2012-11-10: 11:00:00 via INTRAVENOUS

## 2012-11-10 MED ORDER — KETAMINE HCL 100 MG/ML IJ SOLN
INTRAMUSCULAR | Status: DC | PRN
  Administered 2012-11-10: 175 mg via INTRAMUSCULAR

## 2012-11-10 MED ORDER — FENTANYL CITRATE 0.05 MG/ML IJ SOLN
INTRAMUSCULAR | Status: DC | PRN
  Administered 2012-11-10 (×4): 25 ug via INTRAVENOUS

## 2012-11-10 MED ORDER — MUPIROCIN 2 % EX OINT: TOPICAL_OINTMENT | Freq: Two times a day (BID) | CUTANEOUS | Status: DC

## 2012-11-10 MED ORDER — PROPOFOL 10 MG/ML IV BOLUS
INTRAVENOUS | Status: DC | PRN
  Administered 2012-11-10: 120 mg via INTRAVENOUS

## 2012-11-10 MED ORDER — CEFUROXIME SODIUM 1.5 G IJ SOLR
INTRAMUSCULAR | Status: AC
  Filled 2012-11-10: qty 1.5

## 2012-11-10 MED ORDER — GLYCOPYRROLATE 0.2 MG/ML IJ SOLN
INTRAMUSCULAR | Status: DC | PRN
  Administered 2012-11-10: 0.2 mg via INTRAVENOUS

## 2012-11-10 MED ORDER — EPHEDRINE SULFATE 50 MG/ML IJ SOLN
INTRAMUSCULAR | Status: DC | PRN
  Administered 2012-11-10 (×2): 10 mg via INTRAVENOUS

## 2012-11-10 MED ORDER — ONDANSETRON HCL 4 MG/2ML IJ SOLN
INTRAMUSCULAR | Status: DC | PRN
  Administered 2012-11-10: 4 mg via INTRAVENOUS

## 2012-11-10 MED ORDER — SODIUM CHLORIDE 0.9 % IR SOLN
Status: DC | PRN
  Administered 2012-11-10: 1000 mL

## 2012-11-10 MED ORDER — DEXTROSE 5 % IV SOLN
INTRAVENOUS | Status: AC
  Filled 2012-11-10: qty 50

## 2012-11-10 MED ORDER — OXYCODONE HCL 5 MG PO TABS: 5.0000 mg | ORAL_TABLET | ORAL | Status: DC | PRN

## 2012-11-10 MED ORDER — SODIUM CHLORIDE 0.9 % IV SOLN
INTRAVENOUS | Status: DC | PRN
  Administered 2012-11-10: 10:00:00 via INTRAVENOUS

## 2012-11-10 MED ORDER — SODIUM CHLORIDE 0.9 % IR SOLN
Status: DC | PRN
  Administered 2012-11-10: 11:00:00

## 2012-11-10 MED ORDER — KETAMINE HCL 100 MG/ML IJ SOLN
INTRAMUSCULAR | Status: AC
  Filled 2012-11-10: qty 1

## 2012-11-10 SURGICAL SUPPLY — 39 items
ADH SKN CLS APL DERMABOND .7 (GAUZE/BANDAGES/DRESSINGS) ×1
CANISTER SUCTION 2500CC (MISCELLANEOUS) ×2 IMPLANT
CATH EMB 5FR 80CM (CATHETERS) ×4 IMPLANT
CLIP TI MEDIUM 6 (CLIP) ×2 IMPLANT
CLIP TI WIDE RED SMALL 6 (CLIP) ×2 IMPLANT
CLOTH BEACON ORANGE TIMEOUT ST (SAFETY) ×2 IMPLANT
COVER SURGICAL LIGHT HANDLE (MISCELLANEOUS) ×2 IMPLANT
DERMABOND ADVANCED (GAUZE/BANDAGES/DRESSINGS) ×1
DERMABOND ADVANCED .7 DNX12 (GAUZE/BANDAGES/DRESSINGS) ×1 IMPLANT
DRAPE INCISE IOBAN 66X45 STRL (DRAPES) ×1 IMPLANT
DRAPE ORTHO SPLIT 77X108 STRL (DRAPES) ×2
DRAPE SURG ORHT 6 SPLT 77X108 (DRAPES) IMPLANT
ELECT REM PT RETURN 9FT ADLT (ELECTROSURGICAL) ×2
ELECTRODE REM PT RTRN 9FT ADLT (ELECTROSURGICAL) ×1 IMPLANT
GEL ULTRASOUND 20GR AQUASONIC (MISCELLANEOUS) IMPLANT
GLOVE BIO SURGEON STRL SZ 6.5 (GLOVE) ×3 IMPLANT
GLOVE BIOGEL PI IND STRL 6.5 (GLOVE) IMPLANT
GLOVE BIOGEL PI IND STRL 7.0 (GLOVE) IMPLANT
GLOVE BIOGEL PI INDICATOR 6.5 (GLOVE) ×1
GLOVE BIOGEL PI INDICATOR 7.0 (GLOVE) ×1
GLOVE SS BIOGEL STRL SZ 7 (GLOVE) ×1 IMPLANT
GLOVE SUPERSENSE BIOGEL SZ 7 (GLOVE) ×1
GOWN STRL NON-REIN LRG LVL3 (GOWN DISPOSABLE) ×4 IMPLANT
KIT BASIN OR (CUSTOM PROCEDURE TRAY) ×2 IMPLANT
KIT ROOM TURNOVER OR (KITS) ×2 IMPLANT
NS IRRIG 1000ML POUR BTL (IV SOLUTION) ×2 IMPLANT
PACK CV ACCESS (CUSTOM PROCEDURE TRAY) ×2 IMPLANT
PAD ARMBOARD 7.5X6 YLW CONV (MISCELLANEOUS) ×4 IMPLANT
SPONGE GAUZE 4X4 12PLY (GAUZE/BANDAGES/DRESSINGS) ×2 IMPLANT
SPONGE LAP 18X18 X RAY DECT (DISPOSABLE) ×1 IMPLANT
SUT PROLENE 6 0 BV (SUTURE) ×6 IMPLANT
SUT VIC AB 2-0 CT1 27 (SUTURE) ×2
SUT VIC AB 2-0 CT1 TAPERPNT 27 (SUTURE) IMPLANT
SUT VIC AB 3-0 SH 27 (SUTURE) ×2
SUT VIC AB 3-0 SH 27X BRD (SUTURE) ×1 IMPLANT
TOWEL OR 17X24 6PK STRL BLUE (TOWEL DISPOSABLE) ×2 IMPLANT
TOWEL OR 17X26 10 PK STRL BLUE (TOWEL DISPOSABLE) ×2 IMPLANT
UNDERPAD 30X30 INCONTINENT (UNDERPADS AND DIAPERS) ×2 IMPLANT
WATER STERILE IRR 1000ML POUR (IV SOLUTION) ×2 IMPLANT

## 2012-11-10 NOTE — Anesthesia Procedure Notes (Signed)
Procedure Name: LMA Insertion Date/Time: 11/10/2012 10:10 AM Performed by: Elon Alas Pre-anesthesia Checklist: Patient identified, Timeout performed, Emergency Drugs available, Suction available and Patient being monitored Patient Re-evaluated:Patient Re-evaluated prior to inductionOxygen Delivery Method: Circle system utilized Preoxygenation: Pre-oxygenation with 100% oxygen Intubation Type: IV induction Ventilation: Mask ventilation without difficulty LMA: LMA with gastric port inserted LMA Size: 5.0 Number of attempts: 1 Placement Confirmation: positive ETCO2 and breath sounds checked- equal and bilateral Tube secured with: Tape Dental Injury: Teeth and Oropharynx as per pre-operative assessment

## 2012-11-10 NOTE — Preoperative (Signed)
Beta Blockers   Reason not to administer Beta Blockers:Not Applicable 

## 2012-11-10 NOTE — Op Note (Signed)
OPERATIVE REPORT  Date of Surgery: 11/10/2012  Surgeon: Josephina Gip, MD  Assistant: Lorrine Kin  Pre-op Diagnosis: Clotted right thigh AVG End Stage Renal Disease   Post-op Diagnosis: Clotted right thigh Arterivenous graft; End Stage Renal Disease   Procedure: Procedure(s): THROMBECTOMY ARTERIOVENOUS GORE-TEX GRAFT right thigh with exploration of arterial and venous ends Anesthesia: General  EBL: 100 cc  Patient was taken to the operating room placed in the supine position at which time satisfactory general endotracheal anesthesia was administered. The right thigh and groin were prepped with Betadine scrub and solution draped in routine sterile manner. Longitudinal incision was made in the inguinal area through the previous scar. Graft had been thrombectomized 2 and half weeks earlier. The arterial and venous limbs were completely dissected free up to the anastomosis. No heparin was given. Transverse opening was made in the venous Lamb about 3-4 cm from the anastomosis. It was filled with fresh thrombus. Fogarty catheter wouldn't easily traverse the venous anastomosis which had been revised more proximally into the femoral vein in May of 2013. There was no stenosis no 5 dilator easily traverse this. There did seem to be some narrowing of the iliac vein about 30 cm more proximally. There was excellent back bleeding. Fogarty was passed around the graft and multiple passes were performed and borderline inflow was reestablished. Therefore another transverse graftotomy was made about 3 cm from the arterial anastomosis. This was easily thrombectomized and the plug removed and a 5 dilator easily traversed this anastomosis with excellent inflow present. 5 dilator was also passed around the graft and there was some mild narrowing at about the 7:00 position which was dilated with a dilator and is much improved inflow to the venous side. Both graftotomy is were closed with 2 continuous 6-0 Prolene  sutures clamps released there was excellent pulse and thrill in the graft. No protamine or heparin was given. Adequate hemostasis was achieved wound closed layers with Vicryl in a subcuticular fashion with Dermabond patient taken to recovery in stable condition  Complications: None  Procedure Details:   Josephina Gip, MD 11/10/2012 12:11 PM

## 2012-11-10 NOTE — Interval H&P Note (Signed)
History and Physical Interval Note:  11/10/2012 9:53 AM  [name]  has presented today for surgery, with the diagnosis of Clotted right thigh AVG End Stage Renal Disease   The various methods of treatment have been discussed with the patient and family. After consideration of risks, benefits and other options for treatment, the patient has consented to  Procedure(s) (LRB) with comments: THROMBECTOMY ARTERIOVENOUS GORE-TEX GRAFT (Right) - Thrombectomy right thigh AVG; possible revision as a surgical intervention .  The patient's history has been reviewed, patient examined, no change in status, stable for surgery.  I have reviewed the patient's chart and labs.  Questions were answered to the patient's satisfaction.     [name]

## 2012-11-10 NOTE — Anesthesia Postprocedure Evaluation (Signed)
  Anesthesia Post-op Note  Patient: [name]  Procedure(s) Performed: Procedure(s) (LRB) with comments: THROMBECTOMY ARTERIOVENOUS GORE-TEX GRAFT (Right)  Patient Location: PACU  Anesthesia Type:General  Level of Consciousness: awake  Airway and Oxygen Therapy: Patient Spontanous Breathing  Post-op Pain: mild  Post-op Assessment: Post-op Vital signs reviewed  Post-op Vital Signs: stable  Complications: No apparent anesthesia complications

## 2012-11-10 NOTE — H&P (View-Only) (Signed)
  Patient is a long history of incisional disease here today for thrombectomy of right femoral loop AV Gore-Tex graft. He has known superior vena caval syndrome. He did undergo successful thrombectomy and revision on 02/18/2012 has not had a thrombosed he since then. Does have difficulty due to severe mental retardation making anesthesia more difficult.  Past Medical History  Diagnosis Date  . Upper respiratory infection 12/12/2009  . Heart murmur, systolic 08/10/2009  . 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 Hypotension 05/26/2009  . Syncope 05/07/2009  . 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 Esophageal varices 11/07/2008  . Redness or discharge of eye 11/06/2008  . Skin tag 08/25/2008  . Gastric ulcer 10/09    antral, with h pylori positive  . Abdominal pain, acute, generalized 06/28/2008  . Congestive heart failure 03/06/2008  . Cellulitis and abscess of leg, except foot 03/06/2008  . Sinus tachycardia 03/06/2008  . Edema 03/06/2008  . Impacted cerumen of both ears 03/06/2008  . Secondary hyperparathyroidism 02/02/2008  . Mute 02/02/2008  . Mental retardation 02/02/2008  . Renal failure 02/02/2008  . Hypertension 02/02/2008  . Hyperlipidemia 02/02/2008  . Anemia 02/02/2008  . ESRD (end stage renal disease)     History  Substance Use Topics  . Smoking status: Never Smoker   . Smokeless tobacco: Not on file  . Alcohol Use: No    History reviewed. No pertinent family history.  Allergies  Allergen Reactions  . Ace Inhibitors     Current facility-administered medications:0.9 %  sodium chloride infusion, , Intravenous, Continuous, Magaret Justo F Heberto Sturdevant, MD;  cefUROXime (ZINACEF) 1.5 g in dextrose 5 % 50 mL IVPB, 1.5 g, Intravenous, 30 min Pre-Op, Raylyn Speckman F Mary Secord, MD;  midazolam (VERSED) 2 MG/ML syrup 15 mg, 15 mg, Oral, Once, James Terrill Massagee, MD;  mupirocin ointment (BACTROBAN) 2 %, , Nasal, Once, Perpetua Elling F Breckan Cafiero, MD  BP 116/73  Pulse 81  Temp 97.5 F (36.4 C) (Oral)  Resp 18  SpO2  95%  There is no height or weight on file to calculate BMI.       Well-developed gentleman in no acute distress Respirations nonlabored Mental retardation Right femoral loop graft with no flow Skin without ulcers or rashes  Impression and plan an occluded right femoral loop graft for surgical revision. 

## 2012-11-10 NOTE — Anesthesia Preprocedure Evaluation (Addendum)
Anesthesia Evaluation  Patient identified by MRN, date of birth, ID band Patient awake and Patient confused    Reviewed: Allergy & Precautions, H&P , NPO status , Patient's Chart, lab work & pertinent test results, Unable to perform ROS - Chart review only  Airway   Neck ROM: Full   Comment: Unable to assess, Pt with MR and Mute, uncooperative Dental  (+) Poor Dentition   Pulmonary  breath sounds clear to auscultation        Cardiovascular hypertension, Pt. on medications + Peripheral Vascular Disease and +CHF Rhythm:Regular Rate:Normal     Neuro/Psych Seizures -,  PSYCHIATRIC DISORDERS MR   GI/Hepatic PUD, GERD-  ,  Endo/Other    Renal/GU ESRF and DialysisRenal disease     Musculoskeletal   Abdominal   Peds  Hematology   Anesthesia Other Findings Pt unable to cooperate for physical exam secondary to MR Will plan induction in OR or Ketamine upon start of anesthesia  Reproductive/Obstetrics                         Anesthesia Physical Anesthesia Plan  ASA: III  Anesthesia Plan: General and General LMA   Post-op Pain Management:    Induction:   Airway Management Planned:   Additional Equipment:   Intra-op Plan:   Post-operative Plan:   Informed Consent:   Plan Discussed with:   Anesthesia Plan Comments:         Anesthesia Quick Evaluation

## 2012-11-10 NOTE — Transfer of Care (Signed)
Immediate Anesthesia Transfer of Care Note  Patient: [name]  Procedure(s) Performed: Procedure(s) (LRB) with comments: THROMBECTOMY ARTERIOVENOUS GORE-TEX GRAFT (Right)  Patient Location: PACU  Anesthesia Type:General  Level of Consciousness: awake and alert   Airway & Oxygen Therapy: Patient Spontanous Breathing and Patient connected to nasal cannula oxygen  Post-op Assessment: Report given to PACU RN, Post -op Vital signs reviewed and stable and Patient moving all extremities X 4  Post vital signs: Reviewed and stable  Complications: No apparent anesthesia complications

## 2012-11-12 ENCOUNTER — Encounter (HOSPITAL_COMMUNITY): Payer: Self-pay | Admitting: Vascular Surgery

## 2012-11-15 ENCOUNTER — Other Ambulatory Visit: Payer: Self-pay | Admitting: *Deleted

## 2012-11-15 MED ORDER — CLONAZEPAM 0.5 MG PO TABS: 0.2500 mg | ORAL_TABLET | Freq: Two times a day (BID) | ORAL | Status: DC

## 2012-11-15 NOTE — Telephone Encounter (Signed)
I'm not his PCP.

## 2012-11-15 NOTE — Telephone Encounter (Signed)
Called to pharm 

## 2012-11-23 ENCOUNTER — Telehealth: Payer: Self-pay | Admitting: *Deleted

## 2012-11-23 NOTE — Telephone Encounter (Signed)
Call to [facility] for prior Authorization for Clonazepam 0.5 mg tablets.  Prior Authorization was approved 11/23/2012 thru 11/23/2013.  Call to [facility] at [phone]-22 to notify her.  Call to [facility] to notify the pharmacy of the approval.  Angelina Ok, RN 11/23/2012 1:50 PM.

## 2012-12-07 ENCOUNTER — Other Ambulatory Visit: Payer: Self-pay | Admitting: *Deleted

## 2012-12-07 MED ORDER — CEFUROXIME SODIUM 1.5 G IJ SOLR: 1.5000 g | Freq: Once | INTRAMUSCULAR | Status: DC

## 2012-12-07 MED ORDER — DEXTROSE 5 % IV SOLN: 1.5000 g | INTRAVENOUS | Status: DC

## 2012-12-07 MED ORDER — SODIUM CHLORIDE 0.9 % IV SOLN: INTRAVENOUS | Status: DC

## 2012-12-08 ENCOUNTER — Encounter (HOSPITAL_COMMUNITY): Payer: Self-pay | Admitting: Surgery

## 2012-12-08 ENCOUNTER — Encounter (HOSPITAL_COMMUNITY): Payer: Self-pay | Admitting: Anesthesiology

## 2012-12-08 ENCOUNTER — Ambulatory Visit (HOSPITAL_COMMUNITY): Payer: Medicare Other | Admitting: Anesthesiology

## 2012-12-08 ENCOUNTER — Encounter (HOSPITAL_COMMUNITY)
Admission: RE | Disposition: A | Discharge status: Home / Self Care | Payer: Self-pay | Source: Ambulatory Visit | Attending: Vascular Surgery

## 2012-12-08 ENCOUNTER — Ambulatory Visit (HOSPITAL_COMMUNITY)
Admission: RE | Admit: 2012-12-08 | Discharge: 2012-12-08 | Disposition: A | Discharge status: Home / Self Care | Payer: Medicare Other | Source: Ambulatory Visit | Attending: Vascular Surgery | Admitting: Vascular Surgery

## 2012-12-08 ENCOUNTER — Ambulatory Visit (HOSPITAL_COMMUNITY): Payer: Medicare Other

## 2012-12-08 DIAGNOSIS — G478 Other sleep disorders: Secondary | ICD-10-CM | POA: Insufficient documentation

## 2012-12-08 DIAGNOSIS — K219 Gastro-esophageal reflux disease without esophagitis: Secondary | ICD-10-CM | POA: Insufficient documentation

## 2012-12-08 DIAGNOSIS — Z8711 Personal history of peptic ulcer disease: Secondary | ICD-10-CM | POA: Insufficient documentation

## 2012-12-08 DIAGNOSIS — F79 Unspecified intellectual disabilities: Secondary | ICD-10-CM | POA: Insufficient documentation

## 2012-12-08 DIAGNOSIS — N2581 Secondary hyperparathyroidism of renal origin: Secondary | ICD-10-CM | POA: Insufficient documentation

## 2012-12-08 DIAGNOSIS — T82898A Other specified complication of vascular prosthetic devices, implants and grafts, initial encounter: Secondary | ICD-10-CM

## 2012-12-08 DIAGNOSIS — E785 Hyperlipidemia, unspecified: Secondary | ICD-10-CM | POA: Insufficient documentation

## 2012-12-08 DIAGNOSIS — Y832 Surgical operation with anastomosis, bypass or graft as the cause of abnormal reaction of the patient, or of later complication, without mention of misadventure at the time of the procedure: Secondary | ICD-10-CM | POA: Insufficient documentation

## 2012-12-08 DIAGNOSIS — Z992 Dependence on renal dialysis: Secondary | ICD-10-CM | POA: Insufficient documentation

## 2012-12-08 DIAGNOSIS — I509 Heart failure, unspecified: Secondary | ICD-10-CM | POA: Insufficient documentation

## 2012-12-08 DIAGNOSIS — N186 End stage renal disease: Secondary | ICD-10-CM | POA: Insufficient documentation

## 2012-12-08 DIAGNOSIS — I120 Hypertensive chronic kidney disease with stage 5 chronic kidney disease or end stage renal disease: Secondary | ICD-10-CM | POA: Insufficient documentation

## 2012-12-08 HISTORY — PX: VENOGRAM: SHX5497

## 2012-12-08 HISTORY — PX: THROMBECTOMY AND REVISION OF ARTERIOVENTOUS (AV) GORETEX  GRAFT: SHX6120

## 2012-12-08 LAB — POCT I-STAT 4, (NA,K, GLUC, HGB,HCT)
Glucose, Bld: 114 mg/dL — ABNORMAL HIGH (ref 70–99)
HCT: 37 % — ABNORMAL LOW (ref 39.0–52.0)
HCT: 31 % — ABNORMAL LOW (ref 39.0–52.0)
Hemoglobin: 12.6 g/dL — ABNORMAL LOW (ref 13.0–17.0)
Hemoglobin: 10.5 g/dL — ABNORMAL LOW (ref 13.0–17.0)
Potassium: 7.2 mEq/L (ref 3.5–5.1)
Potassium: 4.5 mEq/L (ref 3.5–5.1)
Sodium: 149 mEq/L — ABNORMAL HIGH (ref 135–145)

## 2012-12-08 LAB — BASIC METABOLIC PANEL
Calcium: 9.8 mg/dL (ref 8.4–10.5)
GFR calc Af Amer: 3 mL/min — ABNORMAL LOW (ref >90)
GFR calc non Af Amer: 3 mL/min — ABNORMAL LOW (ref >90)
Sodium: 154 mEq/L — ABNORMAL HIGH (ref 135–145)

## 2012-12-08 SURGERY — THROMBECTOMY AND REVISION OF ARTERIOVENTOUS (AV) GORETEX  GRAFT
Anesthesia: General | Site: Leg Upper | Laterality: Right | Wound class: Clean

## 2012-12-08 MED ORDER — LIDOCAINE HCL (CARDIAC) 20 MG/ML IV SOLN
INTRAVENOUS | Status: DC | PRN
  Administered 2012-12-08: 50 mg via INTRAVENOUS

## 2012-12-08 MED ORDER — CALCIUM CHLORIDE 10 % IV SOLN
INTRAVENOUS | Status: DC | PRN
  Administered 2012-12-08: 500 mg via INTRAVENOUS

## 2012-12-08 MED ORDER — PROPOFOL 10 MG/ML IV BOLUS
INTRAVENOUS | Status: DC | PRN
  Administered 2012-12-08 (×2): 50 mg via INTRAVENOUS

## 2012-12-08 MED ORDER — SODIUM CHLORIDE 0.9 % IV SOLN
INTRAVENOUS | Status: DC | PRN
  Administered 2012-12-08 (×2): via INTRAVENOUS

## 2012-12-08 MED ORDER — DEXTROSE 50 % IV SOLN
INTRAVENOUS | Status: DC | PRN
  Administered 2012-12-08: 12.5 g via INTRAVENOUS

## 2012-12-08 MED ORDER — MUPIROCIN 2 % EX OINT
TOPICAL_OINTMENT | CUTANEOUS | Status: AC
  Administered 2012-12-08: 1
  Filled 2012-12-08: qty 22

## 2012-12-08 MED ORDER — INSULIN ASPART 100 UNIT/ML ~~LOC~~ SOLN
SUBCUTANEOUS | Status: DC | PRN
  Administered 2012-12-08: 10 [IU] via SUBCUTANEOUS

## 2012-12-08 MED ORDER — IODIXANOL 320 MG/ML IV SOLN
INTRAVENOUS | Status: DC | PRN
  Administered 2012-12-08: 95 mL via INTRAVENOUS

## 2012-12-08 MED ORDER — SODIUM POLYSTYRENE SULFONATE 15 GM/60ML PO SUSP
30.0000 g | Freq: Once | ORAL | Status: DC
  Filled 2012-12-08: qty 120

## 2012-12-08 MED ORDER — HEPARIN SODIUM (PORCINE) 1000 UNIT/ML IJ SOLN
INTRAMUSCULAR | Status: DC | PRN
  Administered 2012-12-08: 5000 [IU] via INTRAVENOUS

## 2012-12-08 MED ORDER — DEXTROSE 5 % IV SOLN
1.5000 g | INTRAVENOUS | Status: AC
  Administered 2012-12-08: 1.5 g via INTRAVENOUS
  Filled 2012-12-08: qty 1.5

## 2012-12-08 MED ORDER — SODIUM POLYSTYRENE SULFONATE 15 GM/60ML PO SUSP: 45.0000 g | Freq: Once | ORAL | Status: DC

## 2012-12-08 MED ORDER — SODIUM CHLORIDE 0.9 % IR SOLN
Status: DC | PRN
  Administered 2012-12-08: 10:00:00

## 2012-12-08 MED ORDER — SODIUM CHLORIDE 0.9 % IV SOLN: INTRAVENOUS | Status: DC | PRN

## 2012-12-08 MED ORDER — 0.9 % SODIUM CHLORIDE (POUR BTL) OPTIME
TOPICAL | Status: DC | PRN
  Administered 2012-12-08: 1000 mL

## 2012-12-08 MED ORDER — SODIUM BICARBONATE 8.4 % IV SOLN
INTRAVENOUS | Status: DC | PRN
  Administered 2012-12-08: 50 mL via INTRAVENOUS

## 2012-12-08 MED ORDER — ONDANSETRON HCL 4 MG/2ML IJ SOLN
INTRAMUSCULAR | Status: DC | PRN
  Administered 2012-12-08: 4 mg via INTRAVENOUS

## 2012-12-08 MED ORDER — PHENYLEPHRINE HCL 10 MG/ML IJ SOLN
INTRAMUSCULAR | Status: DC | PRN
  Administered 2012-12-08 (×2): 160 ug via INTRAVENOUS
  Administered 2012-12-08: 120 ug via INTRAVENOUS
  Administered 2012-12-08 (×3): 80 ug via INTRAVENOUS

## 2012-12-08 MED ORDER — ACETAMINOPHEN-CODEINE #3 300-30 MG PO TABS: 1.0000 | ORAL_TABLET | Freq: Four times a day (QID) | ORAL | Status: DC | PRN

## 2012-12-08 MED ORDER — FENTANYL CITRATE 0.05 MG/ML IJ SOLN
INTRAMUSCULAR | Status: DC | PRN
  Administered 2012-12-08: 50 ug via INTRAVENOUS
  Administered 2012-12-08 (×2): 25 ug via INTRAVENOUS

## 2012-12-08 SURGICAL SUPPLY — 49 items
ADH SKN CLS APL DERMABOND .7 (GAUZE/BANDAGES/DRESSINGS) ×2
ADH SKN CLS LQ APL DERMABOND (GAUZE/BANDAGES/DRESSINGS) ×2
CANISTER SUCTION 2500CC (MISCELLANEOUS) ×3 IMPLANT
CATH EMB 4FR 80CM (CATHETERS) ×4 IMPLANT
CLIP TI MEDIUM 6 (CLIP) ×3 IMPLANT
CLIP TI WIDE RED SMALL 6 (CLIP) ×3 IMPLANT
CLOTH BEACON ORANGE TIMEOUT ST (SAFETY) ×3 IMPLANT
COVER SURGICAL LIGHT HANDLE (MISCELLANEOUS) ×3 IMPLANT
DECANTER SPIKE VIAL GLASS SM (MISCELLANEOUS) ×3 IMPLANT
DERMABOND ADHESIVE PROPEN (GAUZE/BANDAGES/DRESSINGS) ×1
DERMABOND ADVANCED (GAUZE/BANDAGES/DRESSINGS) ×1
DERMABOND ADVANCED .7 DNX12 (GAUZE/BANDAGES/DRESSINGS) ×2 IMPLANT
DERMABOND ADVANCED .7 DNX6 (GAUZE/BANDAGES/DRESSINGS) IMPLANT
DRAPE C-ARM 42X72 X-RAY (DRAPES) ×1 IMPLANT
DRAPE INCISE IOBAN 66X45 STRL (DRAPES) ×1 IMPLANT
DRAPE X-RAY CASS 24X20 (DRAPES) IMPLANT
ELECT REM PT RETURN 9FT ADLT (ELECTROSURGICAL) ×3
ELECTRODE REM PT RTRN 9FT ADLT (ELECTROSURGICAL) ×2 IMPLANT
GEL ULTRASOUND 20GR AQUASONIC (MISCELLANEOUS) IMPLANT
GLOVE BIO SURGEON STRL SZ 6.5 (GLOVE) ×1 IMPLANT
GLOVE BIO SURGEON STRL SZ7.5 (GLOVE) ×3 IMPLANT
GLOVE BIOGEL PI IND STRL 6.5 (GLOVE) IMPLANT
GLOVE BIOGEL PI IND STRL 7.0 (GLOVE) IMPLANT
GLOVE BIOGEL PI INDICATOR 6.5 (GLOVE) ×2
GLOVE BIOGEL PI INDICATOR 7.0 (GLOVE) ×1
GLOVE SURG SS PI 6.0 STRL IVOR (GLOVE) ×1 IMPLANT
GOWN PREVENTION PLUS XLARGE (GOWN DISPOSABLE) ×3 IMPLANT
GOWN STRL NON-REIN LRG LVL3 (GOWN DISPOSABLE) ×6 IMPLANT
KIT BASIN OR (CUSTOM PROCEDURE TRAY) ×3 IMPLANT
KIT ROOM TURNOVER OR (KITS) ×3 IMPLANT
LOOP VESSEL MINI RED (MISCELLANEOUS) IMPLANT
NS IRRIG 1000ML POUR BTL (IV SOLUTION) ×3 IMPLANT
PACK CV ACCESS (CUSTOM PROCEDURE TRAY) ×3 IMPLANT
PAD ARMBOARD 7.5X6 YLW CONV (MISCELLANEOUS) ×6 IMPLANT
PAD ELECT DEFIB RADIOL ZOLL (MISCELLANEOUS) ×1 IMPLANT
SET COLLECT BLD 21X3/4 12 (NEEDLE) IMPLANT
SET MICROPUNCTURE 5F STIFF (MISCELLANEOUS) ×1 IMPLANT
SPONGE SURGIFOAM ABS GEL 100 (HEMOSTASIS) IMPLANT
STOPCOCK 4 WAY LG BORE MALE ST (IV SETS) ×1 IMPLANT
SUT PROLENE 6 0 CC (SUTURE) ×4 IMPLANT
SUT VIC AB 3-0 SH 27 (SUTURE) ×3
SUT VIC AB 3-0 SH 27X BRD (SUTURE) ×2 IMPLANT
SUT VICRYL 4-0 PS2 18IN ABS (SUTURE) ×3 IMPLANT
SYR 30ML LL (SYRINGE) ×1 IMPLANT
TOWEL OR 17X24 6PK STRL BLUE (TOWEL DISPOSABLE) ×3 IMPLANT
TOWEL OR 17X26 10 PK STRL BLUE (TOWEL DISPOSABLE) ×3 IMPLANT
TUBING EXTENTION W/L.L. (IV SETS) ×1 IMPLANT
UNDERPAD 30X30 INCONTINENT (UNDERPADS AND DIAPERS) ×3 IMPLANT
WATER STERILE IRR 1000ML POUR (IV SOLUTION) ×3 IMPLANT

## 2012-12-08 NOTE — Transfer of Care (Signed)
Immediate Anesthesia Transfer of Care Note  Patient: [name]  Procedure(s) Performed: Procedure(s) with comments: THROMBECTOMY AND REVISION OF ARTERIOVENTOUS (AV) GORETEX  GRAFT right thigh (Right) VENOGRAM (N/A) - Intraoperative Central venogram  Patient Location: PACU  Anesthesia Type:General  Level of Consciousness: sedated  Airway & Oxygen Therapy: Patient Spontanous Breathing and Patient connected to face mask oxygen  Post-op Assessment: Report given to PACU RN, Post -op Vital signs reviewed and stable and Patient moving all extremities X 4  Post vital signs: Reviewed and stable  Complications: No apparent anesthesia complications

## 2012-12-08 NOTE — H&P (Signed)
  [facility] SHORT STAY H&P  CC:  Clotted right thigh graft  HPI: Multiple recurrent thrombosis right thigh graft.  Last clotted 1/3 and 11/10/12.  Pt is mentally slow.  Past Medical History  Diagnosis Date  . Upper respiratory infection 12/12/2009  . Heart murmur, systolic 08/10/2009  . 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 Hypotension 05/26/2009  . Syncope 05/07/2009  . 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 Esophageal varices 11/07/2008  . Redness or discharge of eye 11/06/2008  . Skin tag 08/25/2008  . Gastric ulcer 10/09    antral, with h pylori positive  . Abdominal pain, acute, generalized 06/28/2008  . Congestive heart failure 03/06/2008  . Cellulitis and abscess of leg, except foot 03/06/2008  . Sinus tachycardia 03/06/2008  . Edema 03/06/2008  . Impacted cerumen of both ears 03/06/2008  . Secondary hyperparathyroidism 02/02/2008  . Mute 02/02/2008  . Mental retardation 02/02/2008  . Renal failure 02/02/2008  . Hypertension 02/02/2008  . Hyperlipidemia 02/02/2008  . Anemia 02/02/2008  . ESRD (end stage renal disease)   . Seizures   . GERD (gastroesophageal reflux disease)   . Hemodialysis patient     T,Th, Sat    FH:  Non-Contributory  Social HX History  Substance Use Topics  . Smoking status: Never Smoker   . Smokeless tobacco: Not on file  . Alcohol Use: No    Allergies No Active Allergies  Medications Current Facility-Administered Medications  Medication Dose Route Frequency Provider Last Rate Last Dose  . cefUROXime (ZINACEF) 1.5 g in dextrose 5 % 50 mL IVPB  1.5 g Intravenous To SSTC Sherren Kerns, MD          PHYSICAL EXAM  Filed Vitals:   12/08/12 0632  BP: 107/73  Pulse: 88  Temp: 97.4 F (36.3 C)  Resp: 18    General:  WDWN in NAD HENT: WNL Eyes: Pupils equal Pulmonary: normal non-labored breathing , without Rales, rhonchi,  wheezing Cardiac: RRR Vascular Exam/Pulses: Occluded right thigh graft, large collateral  chest wall veins, multiple scars in arms from prior grafts  Extremities without ischemic changes, no Gangrene , no cellulitis; no open wounds;  Neuro A&O x 3; good sensation; motion in all extremities  Impression: Clotted right thigh graft  Plan: Thrombectomy possible revision right thigh graft today  Katelan Hirt E @TODAY @ 8:22 AM

## 2012-12-08 NOTE — Progress Notes (Signed)
Spoke with Lenny Pastel, PA with nephrology. Discussed with PA that (per caregiver) pt will not take Kayexalate at home. Pt to go to Dialysis today.

## 2012-12-08 NOTE — Preoperative (Signed)
Beta Blockers   Reason not to administer Beta Blockers:Not Applicable 

## 2012-12-08 NOTE — Anesthesia Postprocedure Evaluation (Signed)
  Anesthesia Post-op Note  Patient: [name]  Procedure(s) Performed: Procedure(s) with comments: THROMBECTOMY AND REVISION OF ARTERIOVENTOUS (AV) GORETEX  GRAFT right thigh (Right) VENOGRAM (N/A) - Intraoperative Central venogram  Patient Location: PACU  Anesthesia Type:General  Level of Consciousness: awake, alert  and patient cooperative  Airway and Oxygen Therapy: Patient Spontanous Breathing  Post-op Pain: none  Post-op Assessment: Post-op Vital signs reviewed, Patient's Cardiovascular Status Stable, Respiratory Function Stable, Patent Airway, No signs of Nausea or vomiting and Pain level controlled  Post-op Vital Signs: stable  Complications: No apparent anesthesia complications

## 2012-12-08 NOTE — Progress Notes (Signed)
Pt here Short stay.  Unable to give consent for procedure due to being mentally challenged.  Sister, Wynona Dove, called and notified of procedure.  Verbal consent given over the phone w/ this RN and Mahala Menghini, RN, witnessing.

## 2012-12-08 NOTE — Anesthesia Procedure Notes (Signed)
Procedure Name: LMA Insertion Date/Time: 12/08/2012 8:43 AM Performed by: Carmela Rima Pre-anesthesia Checklist: Patient identified, Timeout performed, Emergency Drugs available, Suction available and Patient being monitored Patient Re-evaluated:Patient Re-evaluated prior to inductionOxygen Delivery Method: Circle system utilized Intubation Type: Inhalational induction Ventilation: Mask ventilation without difficulty LMA: LMA inserted LMA Size: 4.0 Number of attempts: 1 Placement Confirmation: positive ETCO2 Tube secured with: Tape Dental Injury: Teeth and Oropharynx as per pre-operative assessment

## 2012-12-08 NOTE — Progress Notes (Signed)
Pt refuses obtainig wt, ht, ISTAT & EKG.  Note applied to outside of physical chart notiiying OR.

## 2012-12-08 NOTE — Anesthesia Preprocedure Evaluation (Addendum)
Anesthesia Evaluation  Patient identified by MRN, date of birth, ID band Patient awake    Reviewed: Allergy & Precautions, H&P , NPO status , Patient's Chart, lab work & pertinent test results  Airway Mallampati: I TM Distance: >3 FB Neck ROM: full    Dental  (+) Dental Advidsory Given   Pulmonary          Cardiovascular hypertension, + Peripheral Vascular Disease and +CHF Rhythm:regular Rate:Normal     Neuro/Psych Seizures -,  PSYCHIATRIC DISORDERS    GI/Hepatic PUD, GERD-  Medicated,  Endo/Other    Renal/GU Renal disease     Musculoskeletal   Abdominal   Peds  Hematology  (+) Blood dyscrasia, anemia ,   Anesthesia Other Findings   Reproductive/Obstetrics                          Anesthesia Physical Anesthesia Plan  ASA: III  Anesthesia Plan: MAC and General   Post-op Pain Management:    Induction: Intravenous  Airway Management Planned: LMA and Mask  Additional Equipment:   Intra-op Plan:   Post-operative Plan:   Informed Consent: I have reviewed the patients History and Physical, chart, labs and discussed the procedure including the risks, benefits and alternatives for the proposed anesthesia with the patient or authorized representative who has indicated his/her understanding and acceptance.   Dental Advisory Given  Plan Discussed with: CRNA, Anesthesiologist and Surgeon  Anesthesia Plan Comments:        Anesthesia Quick Evaluation

## 2012-12-08 NOTE — Op Note (Signed)
Procedure: Thrombectomy right thigh AV graft with intraop shuntogram  Preoperative diagnosis: Thrombosed AV graft right thigh  Postoperative diagnosis: Same  Anesthesia: General  Assistant: Della Goo PA-C  Operative findings: No significant venous or arterial narrowing  Operative details: After obtaining informed consent, the patient was taken to the operating room. The patient was placed in supine position on the operating room table. After induction of general anesthesia, the patient's entire right lower extremity was prepped and draped in the usual sterile fashion. A scimitar shaped incision was placed in the right groin.   The incision was carried into the subcutaneous tissues down to level the graft. The graft was dissected free circumferentially. Dissection was carried down to the level of the venous anastomosis. The vein adjacent to the anastomosis was dissected free circumferentially and vessel loops placed around these.   The patient was given 5,000 units of intravenous heparin. A graftotomy was made just above the level of the anastomosis. A #4 Fogarty catheter was used to thrombectomize the venous limb of the graft. There was good venous backbleeding. The anastomosis easily accepted 3 4 and 5 coronary dilators.  This was clamped.  The arterial limb the graft was then thrombectomized with a #4 Fogarty catheter excellent arterial inflow was obtained. This was clamped proximally with a fistula clamp. The graftotomy was repaired using a running 6-0 Prolene suture. Flow was then restored to the graft and patency confirmed by doppler.  A micropuncture sheath was then introduced over a wire to do a completion shuntogram.  First several views of the venous anastomosis were performed confirming no anastomotic narrowing and patent external and internal iliac veins.  The outflow was then occluded with a fistula clamp and the arterial limb of the graft was then studied which showed no arterial  narrowing and brisk flow of contrast around the graft and into the native arterial tree. The sheath was then removed and the hole repaired with a single 6 0 Prolene suture.  Hemostasis was obtained.  The subcutaneous tissues were reapproximated with running 3-0 Vicryl suture. The skin was closed with a 4 0 Vicryl subcuticular stitch. Dermabond was applied the incision.  The patient tolerated the procedure well and there were no complications. Instrument sponge and needle counts were correct at the end of the case. The patient was taken to the recovery room in stable condition.  If the graft reoccludes could again do thrombectomy.  Cause of thrombosis unknown ? hyptotension since pt is on midodrine.  Pt has know SVC occlusion but would be candidate for left thigh graft if right deemed unsalvageable in the future.  [name] Bruns, MD

## 2012-12-10 ENCOUNTER — Encounter (HOSPITAL_COMMUNITY): Payer: Self-pay | Admitting: Vascular Surgery

## 2012-12-12 ENCOUNTER — Emergency Department (HOSPITAL_COMMUNITY): Payer: Medicare Other | Admitting: Certified Registered"

## 2012-12-12 ENCOUNTER — Encounter (HOSPITAL_COMMUNITY): Payer: Self-pay | Admitting: *Deleted

## 2012-12-12 ENCOUNTER — Encounter (HOSPITAL_COMMUNITY): Payer: Self-pay | Admitting: Certified Registered"

## 2012-12-12 ENCOUNTER — Inpatient Hospital Stay: Admit: 2012-12-12 | Payer: Self-pay | Admitting: Surgery

## 2012-12-12 ENCOUNTER — Encounter (HOSPITAL_COMMUNITY): Admission: EM | Disposition: A | Discharge status: Home / Self Care | Payer: Self-pay | Source: Home / Self Care

## 2012-12-12 ENCOUNTER — Inpatient Hospital Stay (HOSPITAL_COMMUNITY): Admission: RE | Admit: 2012-12-12 | Payer: Medicare Other | Source: Ambulatory Visit | Admitting: Surgery

## 2012-12-12 ENCOUNTER — Emergency Department (HOSPITAL_COMMUNITY)
Admission: EM | Admit: 2012-12-12 | Discharge: 2012-12-12 | Disposition: A | Discharge status: Home / Self Care | Payer: Medicare Other | Attending: Surgery | Admitting: Surgery

## 2012-12-12 DIAGNOSIS — Z8711 Personal history of peptic ulcer disease: Secondary | ICD-10-CM | POA: Insufficient documentation

## 2012-12-12 DIAGNOSIS — Y832 Surgical operation with anastomosis, bypass or graft as the cause of abnormal reaction of the patient, or of later complication, without mention of misadventure at the time of the procedure: Secondary | ICD-10-CM | POA: Insufficient documentation

## 2012-12-12 DIAGNOSIS — Z992 Dependence on renal dialysis: Secondary | ICD-10-CM | POA: Insufficient documentation

## 2012-12-12 DIAGNOSIS — I120 Hypertensive chronic kidney disease with stage 5 chronic kidney disease or end stage renal disease: Secondary | ICD-10-CM | POA: Insufficient documentation

## 2012-12-12 DIAGNOSIS — T82898A Other specified complication of vascular prosthetic devices, implants and grafts, initial encounter: Secondary | ICD-10-CM

## 2012-12-12 DIAGNOSIS — I509 Heart failure, unspecified: Secondary | ICD-10-CM | POA: Insufficient documentation

## 2012-12-12 DIAGNOSIS — E785 Hyperlipidemia, unspecified: Secondary | ICD-10-CM | POA: Insufficient documentation

## 2012-12-12 DIAGNOSIS — D649 Anemia, unspecified: Secondary | ICD-10-CM | POA: Insufficient documentation

## 2012-12-12 DIAGNOSIS — F79 Unspecified intellectual disabilities: Secondary | ICD-10-CM | POA: Insufficient documentation

## 2012-12-12 DIAGNOSIS — K219 Gastro-esophageal reflux disease without esophagitis: Secondary | ICD-10-CM | POA: Insufficient documentation

## 2012-12-12 DIAGNOSIS — N186 End stage renal disease: Secondary | ICD-10-CM | POA: Insufficient documentation

## 2012-12-12 DIAGNOSIS — N2581 Secondary hyperparathyroidism of renal origin: Secondary | ICD-10-CM | POA: Insufficient documentation

## 2012-12-12 HISTORY — PX: THROMBECTOMY W/ EMBOLECTOMY: SHX2507

## 2012-12-12 LAB — POCT I-STAT 4, (NA,K, GLUC, HGB,HCT)
Glucose, Bld: 93 mg/dL (ref 70–99)
Potassium: 5.7 mEq/L — ABNORMAL HIGH (ref 3.5–5.1)

## 2012-12-12 SURGERY — THROMBECTOMY ARTERIOVENOUS GORE-TEX GRAFT
Anesthesia: General | Site: Groin | Laterality: Right | Wound class: Clean

## 2012-12-12 MED ORDER — PROMETHAZINE HCL 25 MG/ML IJ SOLN: 6.2500 mg | INTRAMUSCULAR | Status: DC | PRN

## 2012-12-12 MED ORDER — SODIUM CHLORIDE 0.9 % IV SOLN
1.5000 g | INTRAVENOUS | Status: DC | PRN
  Administered 2012-12-12: 1.5 g via INTRAVENOUS

## 2012-12-12 MED ORDER — SODIUM POLYSTYRENE SULFONATE 15 GM/60ML PO SUSP
15.0000 g | Freq: Once | ORAL | Status: DC
  Filled 2012-12-12: qty 60

## 2012-12-12 MED ORDER — HEMOSTATIC AGENTS (NO CHARGE) OPTIME
TOPICAL | Status: DC | PRN
  Administered 2012-12-12: 1 via TOPICAL

## 2012-12-12 MED ORDER — EPHEDRINE SULFATE 50 MG/ML IJ SOLN
INTRAMUSCULAR | Status: DC | PRN
  Administered 2012-12-12: 5 mg via INTRAVENOUS
  Administered 2012-12-12: 10 mg via INTRAVENOUS
  Administered 2012-12-12 (×2): 5 mg via INTRAVENOUS

## 2012-12-12 MED ORDER — ONDANSETRON HCL 4 MG/2ML IJ SOLN: 4.0000 mg | Freq: Once | INTRAMUSCULAR | Status: DC | PRN

## 2012-12-12 MED ORDER — MIDAZOLAM HCL 2 MG/2ML IJ SOLN: 0.5000 mg | Freq: Once | INTRAMUSCULAR | Status: DC | PRN

## 2012-12-12 MED ORDER — KETAMINE HCL 100 MG/ML IJ SOLN
INTRAMUSCULAR | Status: AC
  Filled 2012-12-12: qty 1

## 2012-12-12 MED ORDER — FENTANYL CITRATE 0.05 MG/ML IJ SOLN: 25.0000 ug | INTRAMUSCULAR | Status: DC | PRN

## 2012-12-12 MED ORDER — OXYCODONE HCL 5 MG/5ML PO SOLN: 5.0000 mg | Freq: Once | ORAL | Status: DC | PRN

## 2012-12-12 MED ORDER — OXYCODONE HCL 5 MG PO TABS: 5.0000 mg | ORAL_TABLET | Freq: Four times a day (QID) | ORAL | Status: DC | PRN

## 2012-12-12 MED ORDER — CEFAZOLIN SODIUM-DEXTROSE 2-3 GM-% IV SOLR
INTRAVENOUS | Status: AC
  Filled 2012-12-12: qty 50

## 2012-12-12 MED ORDER — SODIUM CHLORIDE 0.9 % IV SOLN
INTRAVENOUS | Status: DC | PRN
  Administered 2012-12-12 (×2): via INTRAVENOUS

## 2012-12-12 MED ORDER — OXYCODONE HCL 5 MG PO TABS: 5.0000 mg | ORAL_TABLET | Freq: Once | ORAL | Status: DC | PRN

## 2012-12-12 MED ORDER — HEPARIN SODIUM (PORCINE) 1000 UNIT/ML IJ SOLN
INTRAMUSCULAR | Status: DC | PRN
  Administered 2012-12-12: 4000 [IU] via INTRAVENOUS

## 2012-12-12 MED ORDER — CEFUROXIME SODIUM 1.5 G IJ SOLR
INTRAMUSCULAR | Status: AC
  Filled 2012-12-12: qty 1.5

## 2012-12-12 MED ORDER — 0.9 % SODIUM CHLORIDE (POUR BTL) OPTIME
TOPICAL | Status: DC | PRN
  Administered 2012-12-12: 1000 mL

## 2012-12-12 MED ORDER — HYDROMORPHONE HCL PF 1 MG/ML IJ SOLN: 0.2500 mg | INTRAMUSCULAR | Status: DC | PRN

## 2012-12-12 MED ORDER — PHENYLEPHRINE HCL 10 MG/ML IJ SOLN
INTRAMUSCULAR | Status: DC | PRN
  Administered 2012-12-12 (×2): 80 ug via INTRAVENOUS
  Administered 2012-12-12: 60 ug via INTRAVENOUS
  Administered 2012-12-12 (×3): 80 ug via INTRAVENOUS

## 2012-12-12 MED ORDER — SODIUM CHLORIDE 0.9 % IR SOLN
Status: DC | PRN
  Administered 2012-12-12: 10:00:00

## 2012-12-12 MED ORDER — PROPOFOL 10 MG/ML IV BOLUS
INTRAVENOUS | Status: DC | PRN
  Administered 2012-12-12: 20 mg via INTRAVENOUS

## 2012-12-12 SURGICAL SUPPLY — 36 items
ADH SKN CLS APL DERMABOND .7 (GAUZE/BANDAGES/DRESSINGS) ×1
ADH SKN CLS LQ APL DERMABOND (GAUZE/BANDAGES/DRESSINGS) ×1
CANISTER SUCTION 2500CC (MISCELLANEOUS) ×2 IMPLANT
CATH EMB 4FR 80CM (CATHETERS) ×3 IMPLANT
CATH EMB 5FR 80CM (CATHETERS) ×1 IMPLANT
CLIP TI MEDIUM 6 (CLIP) ×2 IMPLANT
CLIP TI WIDE RED SMALL 6 (CLIP) ×2 IMPLANT
CLOTH BEACON ORANGE TIMEOUT ST (SAFETY) ×2 IMPLANT
COVER SURGICAL LIGHT HANDLE (MISCELLANEOUS) ×2 IMPLANT
DERMABOND ADHESIVE PROPEN (GAUZE/BANDAGES/DRESSINGS) ×1
DERMABOND ADVANCED (GAUZE/BANDAGES/DRESSINGS) ×1
DERMABOND ADVANCED .7 DNX12 (GAUZE/BANDAGES/DRESSINGS) ×1 IMPLANT
DERMABOND ADVANCED .7 DNX6 (GAUZE/BANDAGES/DRESSINGS) IMPLANT
ELECT REM PT RETURN 9FT ADLT (ELECTROSURGICAL) ×2
ELECTRODE REM PT RTRN 9FT ADLT (ELECTROSURGICAL) ×1 IMPLANT
GEL ULTRASOUND 20GR AQUASONIC (MISCELLANEOUS) IMPLANT
GLOVE BIOGEL PI IND STRL 7.5 (GLOVE) ×1 IMPLANT
GLOVE BIOGEL PI INDICATOR 7.5 (GLOVE) ×1
GLOVE SURG SS PI 7.5 STRL IVOR (GLOVE) ×2 IMPLANT
GOWN PREVENTION PLUS XXLARGE (GOWN DISPOSABLE) ×2 IMPLANT
GOWN STRL NON-REIN LRG LVL3 (GOWN DISPOSABLE) ×4 IMPLANT
HEMOSTAT SNOW SURGICEL 2X4 (HEMOSTASIS) IMPLANT
HEMOSTAT SURGICEL 2X14 (HEMOSTASIS) IMPLANT
KIT BASIN OR (CUSTOM PROCEDURE TRAY) ×2 IMPLANT
KIT ROOM TURNOVER OR (KITS) ×2 IMPLANT
NS IRRIG 1000ML POUR BTL (IV SOLUTION) ×2 IMPLANT
PACK CV ACCESS (CUSTOM PROCEDURE TRAY) ×2 IMPLANT
PAD ARMBOARD 7.5X6 YLW CONV (MISCELLANEOUS) ×4 IMPLANT
SUT PROLENE 6 0 BV (SUTURE) ×1 IMPLANT
SUT VIC AB 3-0 SH 27 (SUTURE)
SUT VIC AB 3-0 SH 27X BRD (SUTURE) ×1 IMPLANT
SUT VICRYL 4-0 PS2 18IN ABS (SUTURE) IMPLANT
TOWEL OR 17X24 6PK STRL BLUE (TOWEL DISPOSABLE) ×2 IMPLANT
TOWEL OR 17X26 10 PK STRL BLUE (TOWEL DISPOSABLE) ×2 IMPLANT
UNDERPAD 30X30 INCONTINENT (UNDERPADS AND DIAPERS) ×2 IMPLANT
WATER STERILE IRR 1000ML POUR (IV SOLUTION) ×1 IMPLANT

## 2012-12-12 NOTE — Preoperative (Signed)
Beta Blockers   Reason not to administer Beta Blockers:Not Applicable 

## 2012-12-12 NOTE — Anesthesia Preprocedure Evaluation (Addendum)
Anesthesia Evaluation  Patient identified by MRN, date of birth, ID band Patient confused    Reviewed: H&P , NPO status , Patient's Chart, lab work & pertinent test results  Airway Mallampati: I TM Distance: >3 FB     Dental  (+) Teeth Intact   Pulmonary  breath sounds clear to auscultation        Cardiovascular Rate:Normal     Neuro/Psych    GI/Hepatic   Endo/Other    Renal/GU      Musculoskeletal   Abdominal   Peds  Hematology   Anesthesia Other Findings Pt has severe mental retardation. Unable to advise dental damage risk.  Family called.  Reproductive/Obstetrics                           Anesthesia Physical Anesthesia Plan  ASA: III  Anesthesia Plan: General   Post-op Pain Management:    Induction: Inhalational  Airway Management Planned: LMA  Additional Equipment:   Intra-op Plan:   Post-operative Plan: Extubation in OR  Informed Consent: I have reviewed the patients History and Physical, chart, labs and discussed the procedure including the risks, benefits and alternatives for the proposed anesthesia with the patient or authorized representative who has indicated his/her understanding and acceptance.     Plan Discussed with: CRNA, Anesthesiologist and Surgeon  Anesthesia Plan Comments:         Anesthesia Quick Evaluation

## 2012-12-12 NOTE — Op Note (Signed)
[facility] [place]  Patient name: [name] MRN: [id number] DOB: 23-Apr-1951 Sex: male  12/12/2012 Pre-operative Diagnosis: Clotted right thigh AVGG  Post-operative diagnosis:  Same Surgeon:  Jorge Ny Procedure:   Simple thrombectomy of right thigh AVGG Anesthesia:  Gen Blood Loss:  See anesthesia record Specimens:  none  Findings:  Good thrill at end Indications:  62 yo with a mental disability who has recently undergone T&R of this right thigh AVGG.  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  Because he has undergone  Multiple groin incisions and was recently found to have a widely patent venous and arterial anastamosis by venography, I elected to do a simple thrombectomy through an incision at the apex of the graft.  If this occludes early, he will need a new graft  Procedure:  The patient was identified in the holding area and taken to [facility] OR ROOM 16  The patient was then placed supine on the table. general anesthesia was administered.  The patient was prepped and draped in the usual sterile fashion.  A time out was called and antibiotics were administered.  An incision was made at the apex of the graft.  The graft was circumferentially exposed.  The patient was given 4000 units of heparin.  I opened the graft with a horizontal incision.  #4 and #5 Fogarty catheters were used to thrombectomize the graft. There was excellent inflow and good venous backbleeding.  The graftotomy was closed with6-0 prolene.  The skin and soft tissue were closed with 3-0 vicryl.  Dermabond was placed on the skin.  If this graft occludes early, he needs a catheter and evaluation for a new graft.  He potentially could have something in his let arm, however he would need a venogram to determine this.  Otherwise, he would need a left thigh graft.   Disposition:  To PACU in stable condition.   Juleen China, M.D. [facility]  [place] Office: [phone] Pager:  [phone]

## 2012-12-12 NOTE — ED Notes (Signed)
Pt presents for pre-op care.  Pt to have Thrombelectomy of Right Thigh Graft for Dialysis

## 2012-12-12 NOTE — Anesthesia Postprocedure Evaluation (Signed)
  Anesthesia Post-op Note  Patient: [name]  Procedure(s) Performed: Procedure(s): THROMBECTOMY ARTERIOVENOUS GORE-TEX GRAFT (Right)  Patient Location: PACU  Anesthesia Type:General  Level of Consciousness: awake and alert   Airway and Oxygen Therapy: Patient Spontanous Breathing and Patient connected to face mask oxygen  Post-op Pain: mild  Post-op Assessment: Post-op Vital signs reviewed  Post-op Vital Signs: Reviewed  Complications: No apparent anesthesia complications

## 2012-12-12 NOTE — Transfer of Care (Signed)
Immediate Anesthesia Transfer of Care Note  Patient: [name]  Procedure(s) Performed: Procedure(s): THROMBECTOMY ARTERIOVENOUS GORE-TEX GRAFT (Right)  Patient Location: PACU  Anesthesia Type:General  Level of Consciousness: awake, alert  and oriented  Airway & Oxygen Therapy: Patient Spontanous Breathing and Patient connected to face mask oxygen  Post-op Assessment: Report given to PACU RN, Post -op Vital signs reviewed and stable and Patient moving all extremities  Post vital signs: Reviewed and stable  Complications: No apparent anesthesia complications

## 2012-12-12 NOTE — H&P (Signed)
[facility] [place]   Patient name: [name] MRN: [id number] DOB: 1951-02-01 Sex: male     Chief Complaint  Patient presents with  . Pre-op Exam    HISTORY OF PRESENT ILLNESS: The patient comes in because of a clotted right thigh graft. This was recently revised. At that time a shuntogram was performed which showed no significant abnormality within the venous arterial anastomosis. 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  Past Medical History  Diagnosis Date  . Upper respiratory infection 12/12/2009  . Heart murmur, systolic 08/10/2009  . 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 Hypotension 05/26/2009  . Syncope 05/07/2009  . 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 Esophageal varices 11/07/2008  . Redness or discharge of eye 11/06/2008  . Skin tag 08/25/2008  . Gastric ulcer 10/09    antral, with h pylori positive  . Abdominal pain, acute, generalized 06/28/2008  . Congestive heart failure 03/06/2008  . Cellulitis and abscess of leg, except foot 03/06/2008  . Sinus tachycardia 03/06/2008  . Edema 03/06/2008  . Impacted cerumen of both ears 03/06/2008  . Secondary hyperparathyroidism 02/02/2008  . Mute 02/02/2008  . Mental retardation 02/02/2008  . Renal failure 02/02/2008  . Hypertension 02/02/2008  . Hyperlipidemia 02/02/2008  . Anemia 02/02/2008  . ESRD (end stage renal disease)   . Seizures   . GERD (gastroesophageal reflux disease)   . Hemodialysis patient     T,Th, Sat    Past Surgical History  Procedure Laterality Date  . Left forearm graft      for HD  . Arteriovenous graft placement  11/22/10    Right thigh AVG  . Thrombectomy and revision of arterioventous (av) goretex  graft    . Thrombectomy and revision of arterioventous (av) goretex  graft  10/22/2012    Procedure: THROMBECTOMY AND REVISION OF ARTERIOVENTOUS (AV) GORETEX  GRAFT;  Surgeon: Larina Earthly, MD;  Location: [facility] OR;  Service: Vascular;  Laterality: Right;  . Thrombectomy w/  embolectomy  11/10/2012    Procedure: THROMBECTOMY ARTERIOVENOUS GORE-TEX GRAFT;  Surgeon: Pryor Ochoa, MD;  Location: [facility] OR;  Service: Vascular;  Laterality: Right;  . Thrombectomy and revision of arterioventous (av) goretex  graft Right 12/08/2012    Procedure: THROMBECTOMY AND REVISION OF ARTERIOVENTOUS (AV) GORETEX  GRAFT right thigh;  Surgeon: Sherren Kerns, MD;  Location: [facility] OR;  Service: Vascular;  Laterality: Right;  Susie Cassette N/A 12/08/2012    Procedure: VENOGRAM;  Surgeon: Sherren Kerns, MD;  Location: [facility] OR;  Service: Vascular;  Laterality: N/A;  Intraoperative Central venogram    History   Social History  . Marital Status: Single    Spouse Name: N/A    Number of Children: N/A  . Years of Education: N/A   Occupational History  . Not on file.   Social History Main Topics  . Smoking status: Never Smoker   . Smokeless tobacco: Not on file  . Alcohol Use: No  . Drug Use: No  . Sexually Active: No   Other Topics Concern  . Not on file   Social History Narrative  . No narrative on file    History reviewed. No pertinent family history.  Allergies as of 12/12/2012  . (No Active Allergies)    No current facility-administered medications on file prior to encounter.   Current Outpatient Prescriptions on File Prior to Encounter  Medication Sig Dispense Refill  . atorvastatin (LIPITOR) 20 MG tablet Take 20 mg by mouth daily.      Marland Kitchen  clonazePAM (KLONOPIN) 0.5 MG tablet Take 0.5 tablets (0.25 mg total) by mouth 2 (two) times daily.  30 tablet  3  . folic acid-vitamin b complex-vitamin c-selenium-zinc (DIALYVITE) 3 MG TABS Take 1 tablet by mouth daily.      . midodrine (PROAMATINE) 10 MG tablet Take 15 mg by mouth 3 (three) times daily.       . pantoprazole (PROTONIX) 40 MG tablet Take 1 tablet (40 mg total) by mouth daily. Please note change to once a day.  90 tablet  3  . sevelamer (RENAGEL) 800 MG tablet Take 800 mg by mouth 3 (three) times daily with meals.        Marland Kitchen acetaminophen-codeine (TYLENOL #3) 300-30 MG per tablet Take 1 tablet by mouth every 6 (six) hours as needed for pain.  20 tablet  0     REVIEW OF SYSTEMS: No chest pain, shortness of breath, no fevers or chills.  PHYSICAL EXAMINATION:   Vital signs are There were no vitals taken for this visit. General: The patient appears their stated age. HEENT:  No gross abnormalities Pulmonary:  Non labored breathing Abdomen: Soft and non-tender Musculoskeletal: There are no major deformities. Skin: There are no ulcer or rashes noted. Psychiatric: The patient has normal affect. Cardiovascular: No thrill within right thigh graft   Diagnostic Studies None  Assessment: Clotted right thigh graft Plan: I spoke with the patient in the caregiver today. Reason for his graft occlusion is unknown. I have reviewed his recent imaging studies which other than proximal central vein occlusion, shows no abnormality. Because the patient has had multiple groin incisions, I have elected to access the apex of the graft and perform a simple thrombectomy. I discussed that if I cannot get the graft open, I would place a catheter. I also discussed that if this does not have a bur was, new access will be required  V. Charlena Cross, M.D. [facility] [place] Office: [phone] Pager:  [phone]

## 2012-12-13 ENCOUNTER — Other Ambulatory Visit: Payer: Self-pay | Admitting: *Deleted

## 2012-12-13 ENCOUNTER — Other Ambulatory Visit: Payer: Self-pay | Admitting: Radiology

## 2012-12-13 ENCOUNTER — Other Ambulatory Visit (HOSPITAL_COMMUNITY): Payer: Self-pay | Admitting: Nephrology

## 2012-12-13 ENCOUNTER — Telehealth: Payer: Self-pay | Admitting: *Deleted

## 2012-12-13 ENCOUNTER — Encounter (HOSPITAL_COMMUNITY): Payer: Self-pay | Admitting: Surgery

## 2012-12-13 MED ORDER — DEXTROSE 5 % IV SOLN: 1.5000 g | INTRAVENOUS | Status: DC

## 2012-12-13 MED ORDER — CEFUROXIME SODIUM 1.5 G IJ SOLR: 1.5000 g | INTRAMUSCULAR | Status: DC

## 2012-12-13 NOTE — Progress Notes (Signed)
Contacted [name] , renal PA pager no# [phone], verified with her orders in epic from Ralene Muskrat should have been cancelled.  Dr. Myra Gianotti to do surgery on patient on 12/14/12.

## 2012-12-13 NOTE — Telephone Encounter (Signed)
Claud Kelp renal PA called to say [name]'s right thigh AVG is clotted again. She wanted me to schedule a revision and cathter insertion. I discussed with Dr Myra Gianotti and Dr Darrick Penna. They decided to proceed with a Left thigh catheter placement and let the Right thigh heal.Then when it is healed to switch the catheter to the Right thigh and put a new AVG in the Left thigh.

## 2012-12-14 ENCOUNTER — Ambulatory Visit (HOSPITAL_COMMUNITY)
Admission: RE | Admit: 2012-12-14 | Discharge: 2012-12-14 | Disposition: A | Discharge status: Home / Self Care | Payer: Medicare Other | Source: Ambulatory Visit | Attending: Surgery | Admitting: Surgery

## 2012-12-14 ENCOUNTER — Ambulatory Visit (HOSPITAL_COMMUNITY): Payer: Medicare Other

## 2012-12-14 ENCOUNTER — Encounter (HOSPITAL_COMMUNITY): Payer: Self-pay | Admitting: Anesthesiology

## 2012-12-14 ENCOUNTER — Encounter (HOSPITAL_COMMUNITY)
Admission: RE | Disposition: A | Discharge status: Home / Self Care | Payer: Self-pay | Source: Ambulatory Visit | Attending: Surgery

## 2012-12-14 ENCOUNTER — Ambulatory Visit (HOSPITAL_COMMUNITY): Payer: Medicare Other | Admitting: Anesthesiology

## 2012-12-14 ENCOUNTER — Encounter (HOSPITAL_COMMUNITY): Payer: Self-pay | Admitting: Pharmacy Technician

## 2012-12-14 ENCOUNTER — Telehealth: Payer: Self-pay | Admitting: Surgery

## 2012-12-14 DIAGNOSIS — Z79899 Other long term (current) drug therapy: Secondary | ICD-10-CM | POA: Insufficient documentation

## 2012-12-14 DIAGNOSIS — I509 Heart failure, unspecified: Secondary | ICD-10-CM | POA: Insufficient documentation

## 2012-12-14 DIAGNOSIS — Z8711 Personal history of peptic ulcer disease: Secondary | ICD-10-CM | POA: Insufficient documentation

## 2012-12-14 DIAGNOSIS — E785 Hyperlipidemia, unspecified: Secondary | ICD-10-CM | POA: Insufficient documentation

## 2012-12-14 DIAGNOSIS — N186 End stage renal disease: Secondary | ICD-10-CM

## 2012-12-14 DIAGNOSIS — K219 Gastro-esophageal reflux disease without esophagitis: Secondary | ICD-10-CM | POA: Insufficient documentation

## 2012-12-14 DIAGNOSIS — I120 Hypertensive chronic kidney disease with stage 5 chronic kidney disease or end stage renal disease: Secondary | ICD-10-CM | POA: Insufficient documentation

## 2012-12-14 DIAGNOSIS — F79 Unspecified intellectual disabilities: Secondary | ICD-10-CM | POA: Insufficient documentation

## 2012-12-14 DIAGNOSIS — I739 Peripheral vascular disease, unspecified: Secondary | ICD-10-CM | POA: Insufficient documentation

## 2012-12-14 DIAGNOSIS — Z992 Dependence on renal dialysis: Secondary | ICD-10-CM | POA: Insufficient documentation

## 2012-12-14 HISTORY — PX: INSERTION OF DIALYSIS CATHETER: SHX1324

## 2012-12-14 LAB — RENAL FUNCTION PANEL
Albumin: 3.7 g/dL (ref 3.5–5.2)
BUN: 65 mg/dL — ABNORMAL HIGH (ref 6–23)
Calcium: 9.9 mg/dL (ref 8.4–10.5)
Chloride: 106 mEq/L (ref 96–112)
Creatinine, Ser: 18.8 mg/dL — ABNORMAL HIGH (ref 0.50–1.35)
GFR calc non Af Amer: 2 mL/min — ABNORMAL LOW (ref >90)

## 2012-12-14 LAB — CBC
HCT: 30.5 % — ABNORMAL LOW (ref 39.0–52.0)
MCH: 31.3 pg (ref 26.0–34.0)
MCHC: 32.8 g/dL (ref 30.0–36.0)
MCV: 95.3 fL (ref 78.0–100.0)
Platelets: 189 10*3/uL (ref 150–400)
RDW: 15.2 % (ref 11.5–15.5)
WBC: 4.3 10*3/uL (ref 4.0–10.5)

## 2012-12-14 LAB — POCT I-STAT 4, (NA,K, GLUC, HGB,HCT)
Glucose, Bld: 91 mg/dL (ref 70–99)
HCT: 27 % — ABNORMAL LOW (ref 39.0–52.0)
Hemoglobin: 9.2 g/dL — ABNORMAL LOW (ref 13.0–17.0)
Sodium: 148 mEq/L — ABNORMAL HIGH (ref 135–145)

## 2012-12-14 SURGERY — INSERTION OF DIALYSIS CATHETER
Anesthesia: General | Site: Leg Upper | Laterality: Left | Wound class: Clean

## 2012-12-14 MED ORDER — HYDROMORPHONE HCL PF 1 MG/ML IJ SOLN: 0.2500 mg | INTRAMUSCULAR | Status: DC | PRN

## 2012-12-14 MED ORDER — LIDOCAINE HCL (PF) 1 % IJ SOLN: 5.0000 mL | INTRAMUSCULAR | Status: DC | PRN

## 2012-12-14 MED ORDER — HEPARIN SODIUM (PORCINE) 1000 UNIT/ML IJ SOLN
INTRAMUSCULAR | Status: DC | PRN
  Administered 2012-12-14: 1000 [IU]

## 2012-12-14 MED ORDER — ALTEPLASE 2 MG IJ SOLR: 2.0000 mg | Freq: Once | INTRAMUSCULAR | Status: DC | PRN

## 2012-12-14 MED ORDER — SODIUM CHLORIDE 0.9 % IR SOLN
Status: DC | PRN
  Administered 2012-12-14: 08:00:00

## 2012-12-14 MED ORDER — SODIUM CHLORIDE 0.9 % IV SOLN: 100.0000 mL | INTRAVENOUS | Status: DC | PRN

## 2012-12-14 MED ORDER — LIDOCAINE-PRILOCAINE 2.5-2.5 % EX CREA: 1.0000 "application " | TOPICAL_CREAM | CUTANEOUS | Status: DC | PRN

## 2012-12-14 MED ORDER — HEPARIN SODIUM (PORCINE) 1000 UNIT/ML IJ SOLN
INTRAMUSCULAR | Status: AC
  Filled 2012-12-14: qty 1

## 2012-12-14 MED ORDER — HEPARIN SODIUM (PORCINE) 1000 UNIT/ML DIALYSIS: 1000.0000 [IU] | INTRAMUSCULAR | Status: DC | PRN

## 2012-12-14 MED ORDER — NEPRO/CARBSTEADY PO LIQD: 237.0000 mL | ORAL | Status: DC | PRN

## 2012-12-14 MED ORDER — HEPARIN SODIUM (PORCINE) 1000 UNIT/ML DIALYSIS: 20.0000 [IU]/kg | INTRAMUSCULAR | Status: DC | PRN

## 2012-12-14 MED ORDER — DEXTROSE 5 % IV SOLN
1.5000 g | INTRAVENOUS | Status: DC
  Filled 2012-12-14: qty 1.5

## 2012-12-14 MED ORDER — MIDODRINE HCL 5 MG PO TABS
10.0000 mg | ORAL_TABLET | Freq: Three times a day (TID) | ORAL | Status: DC
  Filled 2012-12-14 (×3): qty 2

## 2012-12-14 MED ORDER — ONDANSETRON HCL 4 MG/2ML IJ SOLN: 4.0000 mg | Freq: Once | INTRAMUSCULAR | Status: DC | PRN

## 2012-12-14 MED ORDER — PENTAFLUOROPROP-TETRAFLUOROETH EX AERO: 1.0000 "application " | INHALATION_SPRAY | CUTANEOUS | Status: DC | PRN

## 2012-12-14 MED ORDER — SODIUM CHLORIDE 0.9 % IV SOLN: INTRAVENOUS | Status: DC

## 2012-12-14 SURGICAL SUPPLY — 43 items
ADH SKN CLS APL DERMABOND .7 (GAUZE/BANDAGES/DRESSINGS) ×1
BAG DECANTER FOR FLEXI CONT (MISCELLANEOUS) ×2 IMPLANT
CATH CANNON HEMO 15F 50CM (CATHETERS) ×1 IMPLANT
CATH CANNON HEMO 15FR 19 (HEMODIALYSIS SUPPLIES) IMPLANT
CATH CANNON HEMO 15FR 23CM (HEMODIALYSIS SUPPLIES) IMPLANT
CATH CANNON HEMO 15FR 31CM (HEMODIALYSIS SUPPLIES) IMPLANT
CATH CANNON HEMO 15FR 32 (HEMODIALYSIS SUPPLIES) IMPLANT
CATH CANNON HEMO 15FR 32CM (HEMODIALYSIS SUPPLIES) IMPLANT
CLOTH BEACON ORANGE TIMEOUT ST (SAFETY) ×2 IMPLANT
COVER PROBE W GEL 5X96 (DRAPES) ×2 IMPLANT
COVER SURGICAL LIGHT HANDLE (MISCELLANEOUS) ×2 IMPLANT
DERMABOND ADVANCED (GAUZE/BANDAGES/DRESSINGS) ×1
DERMABOND ADVANCED .7 DNX12 (GAUZE/BANDAGES/DRESSINGS) ×1 IMPLANT
DRAPE C-ARM 42X72 X-RAY (DRAPES) ×2 IMPLANT
DRAPE CHEST BREAST 15X10 FENES (DRAPES) ×2 IMPLANT
GAUZE SPONGE 2X2 8PLY STRL LF (GAUZE/BANDAGES/DRESSINGS) IMPLANT
GAUZE SPONGE 4X4 16PLY XRAY LF (GAUZE/BANDAGES/DRESSINGS) ×2 IMPLANT
GLOVE BIOGEL PI IND STRL 7.5 (GLOVE) ×1 IMPLANT
GLOVE BIOGEL PI INDICATOR 7.5 (GLOVE) ×1
GLOVE SURG SS PI 7.5 STRL IVOR (GLOVE) ×2 IMPLANT
GOWN PREVENTION PLUS XXLARGE (GOWN DISPOSABLE) ×2 IMPLANT
GOWN STRL NON-REIN LRG LVL3 (GOWN DISPOSABLE) ×2 IMPLANT
KIT BASIN OR (CUSTOM PROCEDURE TRAY) ×2 IMPLANT
KIT ROOM TURNOVER OR (KITS) ×2 IMPLANT
NDL 18GX1X1/2 (RX/OR ONLY) (NEEDLE) ×1 IMPLANT
NDL HYPO 25GX1X1/2 BEV (NEEDLE) ×1 IMPLANT
NEEDLE 18GX1X1/2 (RX/OR ONLY) (NEEDLE) ×2 IMPLANT
NEEDLE HYPO 25GX1X1/2 BEV (NEEDLE) ×2 IMPLANT
NS IRRIG 1000ML POUR BTL (IV SOLUTION) ×2 IMPLANT
PACK SURGICAL SETUP 50X90 (CUSTOM PROCEDURE TRAY) ×2 IMPLANT
PAD ARMBOARD 7.5X6 YLW CONV (MISCELLANEOUS) ×4 IMPLANT
SOAP 2 % CHG 4 OZ (WOUND CARE) ×2 IMPLANT
SPONGE GAUZE 2X2 STER 10/PKG (GAUZE/BANDAGES/DRESSINGS) ×1
SUT ETHILON 3 0 PS 1 (SUTURE) ×2 IMPLANT
SUT VICRYL 4-0 PS2 18IN ABS (SUTURE) ×2 IMPLANT
SYR 20CC LL (SYRINGE) ×4 IMPLANT
SYR 30ML LL (SYRINGE) IMPLANT
SYR 5ML LL (SYRINGE) ×2 IMPLANT
SYR CONTROL 10ML LL (SYRINGE) ×2 IMPLANT
SYRINGE 10CC LL (SYRINGE) ×2 IMPLANT
TOWEL OR 17X24 6PK STRL BLUE (TOWEL DISPOSABLE) ×2 IMPLANT
TOWEL OR 17X26 10 PK STRL BLUE (TOWEL DISPOSABLE) ×2 IMPLANT
WATER STERILE IRR 1000ML POUR (IV SOLUTION) ×2 IMPLANT

## 2012-12-14 NOTE — Anesthesia Procedure Notes (Signed)
Procedure Name: LMA Insertion Date/Time: 12/14/2012 7:36 AM Performed by: Sheppard Evens Pre-anesthesia Checklist: Patient identified, Emergency Drugs available, Suction available, Patient being monitored and Timeout performed Patient Re-evaluated:Patient Re-evaluated prior to inductionOxygen Delivery Method: Circle system utilized Preoxygenation: Pre-oxygenation with 100% oxygen Intubation Type: Inhalational induction LMA: LMA with gastric port inserted LMA Size: 4.0 Number of attempts: 1 Placement Confirmation: ETT inserted through vocal cords under direct vision,  positive ETCO2 and breath sounds checked- equal and bilateral Tube secured with: Tape Dental Injury: Teeth and Oropharynx as per pre-operative assessment

## 2012-12-14 NOTE — Transfer of Care (Signed)
Immediate Anesthesia Transfer of Care Note  Patient: [name]  Procedure(s) Performed: Procedure(s): INSERTION OF DIALYSIS CATHETER (Left)  Patient Location: PACU  Anesthesia Type:General  Level of Consciousness: awake and alert   Airway & Oxygen Therapy: Patient Spontanous Breathing  Post-op Assessment: Report given to PACU RN and Post -op Vital signs reviewed and stable  Post vital signs: Reviewed and stable  Complications: No apparent anesthesia complications

## 2012-12-14 NOTE — Op Note (Signed)
Procedure: Ultrasound-guided insertion of Diatek catheter, left groin  Preoperative diagnosis: End-stage renal disease  Postoperative diagnosis: Same  Anesthesia: Local with IV sedation  Operative findings: 55cm Diatek catheter left femoral vein  Operative details: After obtaining informed consent, the patient was taken to the operating room. The patient was placed in supine position on the operating room table. After adequate sedation the patient's left groin was prepped and draped in usual sterile fashion.  Ultrasound was used to identify the patient's left saphenous vein. This had normal compressibility. Using ultrasound guidance, the left saphenous vein was successfully cannulated.  A 0.035 J-tipped guidewire was threaded into the femoral vein and into the inferior vena cava under fluoroscopic guidance.   Next sequential 12 and 14 dilators were placed over the guidewire.  A 16 French dilator with a peel-away sheath was then placed over the guidewire.  The guidewire and dilator were removed. A 55 cm Diatek catheter was then placed through the peel away sheath into the IVC.  The catheter was then tunneled subcutaneously, cut to length, and the hub attached. The catheter was noted to flush and draw easily. The catheter was inspected under fluoroscopy and found with its tip to be near the right atrium without any kinks throughout its course. The catheter was sutured to the skin with nylon sutures. The insertion site was closed with Vicryl stitch. The catheter was then loaded with concentrated Heparin solution. A dry sterile dressing was applied.  The patient tolerated procedure well and there were no complications. Instrument sponge and needle counts correct in the case. The patient was taken to the recovery room in stable condition. Chest x-ray and KUB will be obtained in the recovery room.  Future Plans:  Once the patient's right groin incision heals, he will need a right femoral diatek catheter and  a left thigh graft.   Durene Cal, MD [facility] [place] Office: [phone] Pager: [phone]

## 2012-12-14 NOTE — Anesthesia Preprocedure Evaluation (Signed)
Anesthesia Evaluation  Patient identified by MRN, date of birth, ID band Patient confused    Reviewed: Allergy & Precautions, H&P , NPO status , Patient's Chart, lab work & pertinent test results  Airway Mallampati: I TM Distance: >3 FB Neck ROM: full    Dental   Pulmonary          Cardiovascular hypertension, + Peripheral Vascular Disease and +CHF Rhythm:regular Rate:Normal     Neuro/Psych Seizures -,  PSYCHIATRIC DISORDERS    GI/Hepatic PUD, GERD-  ,  Endo/Other    Renal/GU ESRF and DialysisRenal disease     Musculoskeletal   Abdominal   Peds  Hematology   Anesthesia Other Findings   Reproductive/Obstetrics                           Anesthesia Physical Anesthesia Plan  ASA: III  Anesthesia Plan: General   Post-op Pain Management:    Induction: Inhalational  Airway Management Planned: LMA  Additional Equipment:   Intra-op Plan:   Post-operative Plan: Extubation in OR  Informed Consent: I have reviewed the patients History and Physical, chart, labs and discussed the procedure including the risks, benefits and alternatives for the proposed anesthesia with the patient or authorized representative who has indicated his/her understanding and acceptance.     Plan Discussed with: CRNA, Anesthesiologist and Surgeon  Anesthesia Plan Comments:         Anesthesia Quick Evaluation

## 2012-12-14 NOTE — Anesthesia Postprocedure Evaluation (Signed)
  Anesthesia Post-op Note  Patient: [name]  Procedure(s) Performed: Procedure(s): INSERTION OF DIALYSIS CATHETER (Left)  Patient Location: PACU  Anesthesia Type:General  Level of Consciousness: awake, alert  and patient cooperative  Airway and Oxygen Therapy: Patient Spontanous Breathing  Post-op Pain: none  Post-op Assessment: Post-op Vital signs reviewed, Patient's Cardiovascular Status Stable, Respiratory Function Stable, Patent Airway, No signs of Nausea or vomiting and Pain level controlled  Post-op Vital Signs: stable  Complications: No apparent anesthesia complications

## 2012-12-14 NOTE — H&P (Signed)
[facility] [place]   Patient name: [name] MRN: [id number] DOB: 1951/08/23 Sex: male    No chief complaint on file.   HISTORY OF PRESENT ILLNESS: Needs access.  Recent thrombectomy failed  Past Medical History  Diagnosis Date  . Upper respiratory infection 12/12/2009  . Heart murmur, systolic 08/10/2009  . 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 Hypotension 05/26/2009  . Syncope 05/07/2009  . 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 Esophageal varices 11/07/2008  . Redness or discharge of eye 11/06/2008  . Skin tag 08/25/2008  . Gastric ulcer 10/09    antral, with h pylori positive  . Abdominal pain, acute, generalized 06/28/2008  . Congestive heart failure 03/06/2008  . Cellulitis and abscess of leg, except foot 03/06/2008  . Sinus tachycardia 03/06/2008  . Edema 03/06/2008  . Impacted cerumen of both ears 03/06/2008  . Secondary hyperparathyroidism 02/02/2008  . Mute 02/02/2008  . Mental retardation 02/02/2008  . Renal failure 02/02/2008  . Hypertension 02/02/2008  . Hyperlipidemia 02/02/2008  . Anemia 02/02/2008  . ESRD (end stage renal disease)   . Seizures   . GERD (gastroesophageal reflux disease)   . Hemodialysis patient     T,Th, Sat    Past Surgical History  Procedure Laterality Date  . Left forearm graft      for HD  . Arteriovenous graft placement  11/22/10    Right thigh AVG  . Thrombectomy and revision of arterioventous (av) goretex  graft    . Thrombectomy and revision of arterioventous (av) goretex  graft  10/22/2012    Procedure: THROMBECTOMY AND REVISION OF ARTERIOVENTOUS (AV) GORETEX  GRAFT;  Surgeon: Larina Earthly, MD;  Location: [facility] OR;  Service: Vascular;  Laterality: Right;  . Thrombectomy w/ embolectomy  11/10/2012    Procedure: THROMBECTOMY ARTERIOVENOUS GORE-TEX GRAFT;  Surgeon: Pryor Ochoa, MD;  Location: [facility] OR;  Service: Vascular;  Laterality: Right;  . Thrombectomy and revision of arterioventous (av) goretex  graft Right  12/08/2012    Procedure: THROMBECTOMY AND REVISION OF ARTERIOVENTOUS (AV) GORETEX  GRAFT right thigh;  Surgeon: Sherren Kerns, MD;  Location: [facility] OR;  Service: Vascular;  Laterality: Right;  Susie Cassette N/A 12/08/2012    Procedure: VENOGRAM;  Surgeon: Sherren Kerns, MD;  Location: [facility] OR;  Service: Vascular;  Laterality: N/A;  Intraoperative Central venogram  . Thrombectomy w/ embolectomy Right 12/12/2012    Procedure: THROMBECTOMY ARTERIOVENOUS GORE-TEX GRAFT;  Surgeon: Nada Libman, MD;  Location: [facility] OR;  Service: Vascular;  Laterality: Right;    History   Social History  . Marital Status: Single    Spouse Name: N/A    Number of Children: N/A  . Years of Education: N/A   Occupational History  . Not on file.   Social History Main Topics  . Smoking status: Never Smoker   . Smokeless tobacco: Not on file  . Alcohol Use: No  . Drug Use: No  . Sexually Active: No   Other Topics Concern  . Not on file   Social History Narrative  . No narrative on file    History reviewed. No pertinent family history.  Allergies as of 12/13/2012  . (No Known Allergies)    No current facility-administered medications on file prior to encounter.   Current Outpatient Prescriptions on File Prior to Encounter  Medication Sig Dispense Refill  . atorvastatin (LIPITOR) 20 MG tablet Take 20 mg by mouth daily.      . clonazePAM (KLONOPIN) 0.5  MG tablet Take 0.5 tablets (0.25 mg total) by mouth 2 (two) times daily.  30 tablet  3  . folic acid-vitamin b complex-vitamin c-selenium-zinc (DIALYVITE) 3 MG TABS Take 1 tablet by mouth daily.      . midodrine (PROAMATINE) 10 MG tablet Take 15 mg by mouth 3 (three) times daily.       . pantoprazole (PROTONIX) 40 MG tablet Take 1 tablet (40 mg total) by mouth daily. Please note change to once a day.  90 tablet  3  . phenytoin (DILANTIN) 100 MG ER capsule Take 200 mg by mouth at bedtime.      . sevelamer (RENAGEL) 800 MG tablet Take 800 mg by mouth 3 (three)  times daily with meals.          REVIEW OF SYSTEMS: No changes  PHYSICAL EXAMINATION:   Vital signs are BP 111/74  Pulse 88  Temp(Src) 97.5 F (36.4 C) (Oral)  Resp 18  Wt 130 lb (58.968 kg)  SpO2 100% General: The patient appears their stated age. HEENT:  No gross abnormalities Pulmonary:  Non labored breathing Abdomen: Soft and non-tender Musculoskeletal: There are no major deformities. Neurologic: No focal weakness or paresthesias are detected, Skin: There are no ulcer or rashes noted. Psychiatric: The patient has normal affect. Cardiovascular: occluded thigh graft    Assessment: ESRD  Plan: Will place left thigh catheter  V. Charlena Cross, M.D. [facility] [place] Office: [phone] Pager:  [phone]

## 2012-12-14 NOTE — Telephone Encounter (Addendum)
Message copied by Shari Prows on Tue Dec 14, 2012 10:02 AM ------      Message from: Melene Plan      Created: Tue Dec 14, 2012  9:51 AM                   ----- Message -----         From: Dara Lords, PA         Sent: 12/14/2012   8:22 AM           To: Melene Plan, RN, Sharee Pimple, CMA            Pt to f/u with MD who is available in 3 weeks.            Thanks,      Lelon Mast ------ I scheduled an appt for the above pt on 01/03/13 at 11am w/ [facility]. I mailed an appt letter and also lm for the pt/awt

## 2012-12-14 NOTE — Procedures (Signed)
I was present at this dialysis session. I have reviewed the session itself and made appropriate changes.   [name] is a 62 yo male w hx of mental disability, HTN and ESRD. He has an occluded SVC and has been getting dialysis via thigh AVG's since about Feb 2012.  Current R thigh AVG has had repeated occlusions in Jan '14 and most recently had a simple thrombectomy done two days ago 2/23 by Dr. Myra Gianotti.  The graft rethrombosed and patient is here today for femoral vein tunneled HD cath placement, which was done this morning without complications. Currently getting dialysis and will be discharged home thereafter if stable. Exam shows clear lungs, no JVD, alert in no distress. UF goal 1-2 kg. Takes midodrine pre-HD which was ordered.   [name] Moselle, MD [employer] 12/14/2012, 12:45 PM

## 2012-12-14 NOTE — Progress Notes (Signed)
Hemodialysis-Unable to obtain UF goal d/t poorly functioning new catheter. Could not obtain bfr >250. Continued until catheter would barely push via venous port. Dr.Schertz aware. Pt is to be discharged to ride waiting in Short Stay. Discharged from unit in stable condition.

## 2012-12-14 NOTE — Progress Notes (Signed)
Transferred to HD via W/C . Family present.

## 2012-12-14 NOTE — Progress Notes (Signed)
Spoke with Gerome Apley PA , patient is to be dialyzed here at [facility] instead of [facility]. Patient will be D/C from there.

## 2012-12-15 ENCOUNTER — Telehealth: Payer: Self-pay | Admitting: Surgery

## 2012-12-15 NOTE — Telephone Encounter (Addendum)
12/15/12- already scheduled- just added additional notes to appt, dpm   Message copied by Fredrich Birks on Wed Dec 15, 2012 10:34 AM ------      Message from: Melene Plan      Created: Wed Dec 15, 2012  9:22 AM                   ----- Message -----         From: Nada Libman, MD         Sent: 12/14/2012   7:52 PM           To: Reuel Derby, Melene Plan, RN            12/14/2012:            Left femoral diatek catheter under U/S guidance.            Schedule the patient to come back to the office in 3 weeks to see any provider.  His right groin incision will need to be inspected to see if he is able to have the catheter placed into the right groin and a left thigh graft placed ------

## 2012-12-16 ENCOUNTER — Encounter (HOSPITAL_COMMUNITY): Payer: Self-pay | Admitting: Surgery

## 2012-12-21 ENCOUNTER — Observation Stay (HOSPITAL_COMMUNITY)
Admission: EM | Admit: 2012-12-21 | Discharge: 2012-12-25 | Disposition: A | Discharge status: Home / Self Care | Payer: Medicare Other | Attending: Internal Medicine | Admitting: Internal Medicine

## 2012-12-21 ENCOUNTER — Encounter (HOSPITAL_COMMUNITY): Payer: Self-pay

## 2012-12-21 ENCOUNTER — Emergency Department (HOSPITAL_COMMUNITY): Payer: Medicare Other

## 2012-12-21 DIAGNOSIS — E8809 Other disorders of plasma-protein metabolism, not elsewhere classified: Secondary | ICD-10-CM | POA: Insufficient documentation

## 2012-12-21 DIAGNOSIS — I959 Hypotension, unspecified: Secondary | ICD-10-CM

## 2012-12-21 DIAGNOSIS — R4701 Aphasia: Secondary | ICD-10-CM | POA: Insufficient documentation

## 2012-12-21 DIAGNOSIS — Z992 Dependence on renal dialysis: Secondary | ICD-10-CM | POA: Insufficient documentation

## 2012-12-21 DIAGNOSIS — R5381 Other malaise: Secondary | ICD-10-CM | POA: Insufficient documentation

## 2012-12-21 DIAGNOSIS — I509 Heart failure, unspecified: Secondary | ICD-10-CM | POA: Insufficient documentation

## 2012-12-21 DIAGNOSIS — I9589 Other hypotension: Secondary | ICD-10-CM | POA: Insufficient documentation

## 2012-12-21 DIAGNOSIS — Z91199 Patient's noncompliance with other medical treatment and regimen due to unspecified reason: Secondary | ICD-10-CM | POA: Insufficient documentation

## 2012-12-21 DIAGNOSIS — D649 Anemia, unspecified: Secondary | ICD-10-CM | POA: Insufficient documentation

## 2012-12-21 DIAGNOSIS — F79 Unspecified intellectual disabilities: Secondary | ICD-10-CM

## 2012-12-21 DIAGNOSIS — N2581 Secondary hyperparathyroidism of renal origin: Secondary | ICD-10-CM | POA: Insufficient documentation

## 2012-12-21 DIAGNOSIS — R9431 Abnormal electrocardiogram [ECG] [EKG]: Secondary | ICD-10-CM | POA: Insufficient documentation

## 2012-12-21 DIAGNOSIS — R569 Unspecified convulsions: Secondary | ICD-10-CM

## 2012-12-21 DIAGNOSIS — N186 End stage renal disease: Secondary | ICD-10-CM

## 2012-12-21 DIAGNOSIS — I120 Hypertensive chronic kidney disease with stage 5 chronic kidney disease or end stage renal disease: Secondary | ICD-10-CM | POA: Insufficient documentation

## 2012-12-21 DIAGNOSIS — G40909 Epilepsy, unspecified, not intractable, without status epilepticus: Principal | ICD-10-CM | POA: Insufficient documentation

## 2012-12-21 LAB — CBC WITH DIFFERENTIAL/PLATELET
Basophils Relative: 1 % (ref 0–1)
Eosinophils Absolute: 0.1 10*3/uL (ref 0.0–0.7)
Eosinophils Relative: 1 % (ref 0–5)
Lymphs Abs: 1.1 10*3/uL (ref 0.7–4.0)
MCH: 31.1 pg (ref 26.0–34.0)
MCHC: 33.2 g/dL (ref 30.0–36.0)
MCV: 93.8 fL (ref 78.0–100.0)
Monocytes Relative: 13 % — ABNORMAL HIGH (ref 3–12)
Platelets: 201 10*3/uL (ref 150–400)
RBC: 3.05 MIL/uL — ABNORMAL LOW (ref 4.22–5.81)

## 2012-12-21 LAB — COMPREHENSIVE METABOLIC PANEL
Albumin: 3.5 g/dL (ref 3.5–5.2)
BUN: 17 mg/dL (ref 6–23)
Creatinine, Ser: 5.76 mg/dL — ABNORMAL HIGH (ref 0.50–1.35)
Potassium: 3.3 mEq/L — ABNORMAL LOW (ref 3.5–5.1)
Total Protein: 8 g/dL (ref 6.0–8.3)

## 2012-12-21 LAB — POCT I-STAT, CHEM 8
BUN: 18 mg/dL (ref 6–23)
Chloride: 96 mEq/L (ref 96–112)
Potassium: 3.3 mEq/L — ABNORMAL LOW (ref 3.5–5.1)
Sodium: 142 mEq/L (ref 135–145)

## 2012-12-21 LAB — PHENYTOIN LEVEL, TOTAL: Phenytoin Lvl: 2.6 ug/mL — ABNORMAL LOW (ref 10.0–20.0)

## 2012-12-21 LAB — CG4 I-STAT (LACTIC ACID): Lactic Acid, Venous: 2.8 mmol/L — ABNORMAL HIGH (ref 0.5–2.2)

## 2012-12-21 MED ORDER — SODIUM CHLORIDE 0.9 % IV SOLN
INTRAVENOUS | Status: DC
  Administered 2012-12-21: 22:00:00 via INTRAVENOUS

## 2012-12-21 MED ORDER — SODIUM CHLORIDE 0.9 % IV BOLUS (SEPSIS)
250.0000 mL | Freq: Once | INTRAVENOUS | Status: AC
  Administered 2012-12-21: 250 mL via INTRAVENOUS

## 2012-12-21 MED ORDER — SODIUM CHLORIDE 0.9 % IV SOLN
1000.0000 mg | Freq: Once | INTRAVENOUS | Status: AC
  Administered 2012-12-22: 1000 mg via INTRAVENOUS
  Filled 2012-12-21: qty 20

## 2012-12-21 NOTE — ED Notes (Signed)
Dr. Freida Busman aware of current BP - orders given to adm 250 cc NS; pt remains awake, will track staff around room; family in at bedside - states pt is currently at his baseline

## 2012-12-21 NOTE — ED Provider Notes (Signed)
History     CSN: [id number]  Arrival date & time 12/21/12  2105   First MD Initiated Contact with Patient 12/21/12 2121      Chief Complaint  Patient presents with  . Seizures    family reports "fell onto ground, started shaking and foaming at mouth; known hx of seizures; presently awake, nonverbal - does not speak - only grunts     (Consider location/radiation/quality/duration/timing/severity/associated sxs/prior treatment) Patient is a 62 y.o. male presenting with seizures. The history is provided by the patient. The history is limited by the condition of the patient.  Seizures  patient here after having a witnessed seizure by family. Unknown amount of time that this lasted. EMS arrived the patient was not postictal. He does have a history of seizure disorder takes Dilantin. Patient is also dialysis patient and did have his normal dialysis session today. Blood sugar was greater than 100. Patient transported here for further evaluation. He does have a history of mental retardation and is nonverbal  Past Medical History  Diagnosis Date  . Upper respiratory infection 12/12/2009  . Heart murmur, systolic 08/10/2009  . 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 Hypotension 05/26/2009  . Syncope 05/07/2009  . 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 Esophageal varices 11/07/2008  . Redness or discharge of eye 11/06/2008  . Skin tag 08/25/2008  . Gastric ulcer 10/09    antral, with h pylori positive  . Abdominal pain, acute, generalized 06/28/2008  . Congestive heart failure 03/06/2008  . Cellulitis and abscess of leg, except foot 03/06/2008  . Sinus tachycardia 03/06/2008  . Edema 03/06/2008  . Impacted cerumen of both ears 03/06/2008  . Secondary hyperparathyroidism 02/02/2008  . Mute 02/02/2008  . Mental retardation 02/02/2008  . Hypertension 02/02/2008  . Hyperlipidemia 02/02/2008  . Anemia 02/02/2008  . ESRD (end stage renal disease)     TTS hemodialysis  . Seizures   . GERD  (gastroesophageal reflux disease)     Past Surgical History  Procedure Laterality Date  . Left forearm graft      for HD  . Arteriovenous graft placement  11/22/10    Right thigh AVG  . Thrombectomy and revision of arterioventous (av) goretex  graft    . Thrombectomy and revision of arterioventous (av) goretex  graft  10/22/2012    Procedure: THROMBECTOMY AND REVISION OF ARTERIOVENTOUS (AV) GORETEX  GRAFT;  Surgeon: Larina Earthly, MD;  Location: [facility] OR;  Service: Vascular;  Laterality: Right;  . Thrombectomy w/ embolectomy  11/10/2012    Procedure: THROMBECTOMY ARTERIOVENOUS GORE-TEX GRAFT;  Surgeon: Pryor Ochoa, MD;  Location: [facility] OR;  Service: Vascular;  Laterality: Right;  . Thrombectomy and revision of arterioventous (av) goretex  graft Right 12/08/2012    Procedure: THROMBECTOMY AND REVISION OF ARTERIOVENTOUS (AV) GORETEX  GRAFT right thigh;  Surgeon: Sherren Kerns, MD;  Location: [facility] OR;  Service: Vascular;  Laterality: Right;  Susie Cassette N/A 12/08/2012    Procedure: VENOGRAM;  Surgeon: Sherren Kerns, MD;  Location: [facility] OR;  Service: Vascular;  Laterality: N/A;  Intraoperative Central venogram  . Thrombectomy w/ embolectomy Right 12/12/2012    Procedure: THROMBECTOMY ARTERIOVENOUS GORE-TEX GRAFT;  Surgeon: Nada Libman, MD;  Location: [facility] OR;  Service: Vascular;  Laterality: Right;  . Insertion of dialysis catheter Left 12/14/2012    Procedure: INSERTION OF DIALYSIS CATHETER;  Surgeon: Nada Libman, MD;  Location: [facility] OR;  Service: Vascular;  Laterality: Left;    History reviewed. No pertinent family history.  History  Substance Use Topics  . Smoking status: Never Smoker   . Smokeless tobacco: Not on file  . Alcohol Use: No      Review of Systems  Unable to perform ROS Neurological: Positive for seizures.    Allergies  Review of patient's allergies indicates no known allergies.  Home Medications   Current Outpatient Rx  Name  Route  Sig  Dispense  Refill  .  clonazePAM (KLONOPIN) 0.5 MG tablet   Oral   Take 0.5 tablets (0.25 mg total) by mouth 2 (two) times daily.   30 tablet   3   . folic acid-vitamin b complex-vitamin c-selenium-zinc (DIALYVITE) 3 MG TABS   Oral   Take 1 tablet by mouth daily.         . midodrine (PROAMATINE) 10 MG tablet   Oral   Take 15 mg by mouth 3 (three) times daily.          . pantoprazole (PROTONIX) 40 MG tablet   Oral   Take 1 tablet (40 mg total) by mouth daily. Please note change to once a day.   90 tablet   3   . phenytoin (DILANTIN) 100 MG ER capsule   Oral   Take 200 mg by mouth at bedtime.         . sevelamer carbonate (RENVELA) 800 MG tablet   Oral   Take 800 mg by mouth 3 (three) times daily with meals.         Marland Kitchen atorvastatin (LIPITOR) 20 MG tablet   Oral   Take 20 mg by mouth at bedtime.            BP 85/51  Pulse 105  Temp(Src) 98.5 F (36.9 C) (Rectal)  Resp 20  SpO2 98%  Physical Exam  Nursing note and vitals reviewed. Constitutional: He appears well-developed and well-nourished.  Non-toxic appearance. No distress.  HENT:  Head: Normocephalic and atraumatic.  Eyes: Conjunctivae, EOM and lids are normal. Pupils are equal, round, and reactive to light.  Neck: Normal range of motion. Neck supple. No tracheal deviation present. No mass present.  Cardiovascular: Normal rate, regular rhythm and normal heart sounds.  Exam reveals no gallop.   No murmur heard. Pulmonary/Chest: Effort normal and breath sounds normal. No stridor. No respiratory distress. He has no decreased breath sounds. He has no wheezes. He has no rhonchi. He has no rales.  Abdominal: Soft. Normal appearance and bowel sounds are normal. He exhibits no distension. There is no tenderness. There is no rebound and no CVA tenderness.  Musculoskeletal: Normal range of motion. He exhibits no edema and no tenderness.  Neurological: He is alert. He has normal strength. No cranial nerve deficit or sensory deficit. GCS  eye subscore is 4. GCS verbal subscore is 5. GCS motor subscore is 6.  Skin: Skin is warm and dry. No abrasion and no rash noted.  Psychiatric: His affect is blunt. He is withdrawn.    ED Course  Procedures (including critical care time)  Labs Reviewed  POCT I-STAT, CHEM 8 - Abnormal; Notable for the following:    Potassium 3.3 (*)    Creatinine, Ser 5.50 (*)    Glucose, Bld 175 (*)    Calcium, Ion 0.99 (*)    Hemoglobin 9.9 (*)    HCT 29.0 (*)    All other components within normal limits  CBC WITH DIFFERENTIAL  COMPREHENSIVE METABOLIC PANEL  PHENYTOIN LEVEL, TOTAL   No results found.   No diagnosis  found.    MDM    Date: 12/21/2012  Rate: 101  Rhythm: sinus tachycardia  QRS Axis: normal  Intervals: normal  ST/T Wave abnormalities: nonspecific ST changes  Conduction Disutrbances:none  Narrative Interpretation:   Old EKG Reviewed: unchanged  Patient given IV fluids gently for his hypotension. Blood pressure has somewhat improved. No seizure activity here. Dilantin level noted and was subtherapeutic. Dilantin ordered IV piggyback. Patient to be admitted by teaching service   CRITICAL CARE Performed by: Toy Baker   Total critical care time: 65  Critical care time was exclusive of separately billable procedures and treating other patients.  Critical care was necessary to treat or prevent imminent or life-threatening deterioration.  Critical care was time spent personally by me on the following activities: development of treatment plan with patient and/or surrogate as well as nursing, discussions with consultants, evaluation of patient's response to treatment, examination of patient, obtaining history from patient or surrogate, ordering and performing treatments and interventions, ordering and review of laboratory studies, ordering and review of radiographic studies, pulse oximetry and re-evaluation of patient's condition.          Toy Baker,  MD 12/21/12 [phone]

## 2012-12-21 NOTE — ED Notes (Signed)
Blood glucose 186 mg/dl enroute per EMS

## 2012-12-22 ENCOUNTER — Encounter (HOSPITAL_COMMUNITY): Payer: Self-pay

## 2012-12-22 LAB — CBC
Hemoglobin: 8.6 g/dL — ABNORMAL LOW (ref 13.0–17.0)
MCH: 31.7 pg (ref 26.0–34.0)
MCV: 94.8 fL (ref 78.0–100.0)
RBC: 2.71 MIL/uL — ABNORMAL LOW (ref 4.22–5.81)

## 2012-12-22 LAB — COMPREHENSIVE METABOLIC PANEL
AST: 68 U/L — ABNORMAL HIGH (ref 0–37)
Albumin: 3.2 g/dL — ABNORMAL LOW (ref 3.5–5.2)
Calcium: 8.8 mg/dL (ref 8.4–10.5)
Chloride: 102 mEq/L (ref 96–112)
Creatinine, Ser: 6.6 mg/dL — ABNORMAL HIGH (ref 0.50–1.35)

## 2012-12-22 MED ORDER — MORPHINE SULFATE 2 MG/ML IJ SOLN: 2.0000 mg | INTRAMUSCULAR | Status: DC | PRN

## 2012-12-22 MED ORDER — SEVELAMER CARBONATE 800 MG PO TABS
800.0000 mg | ORAL_TABLET | Freq: Three times a day (TID) | ORAL | Status: DC
  Administered 2012-12-22 – 2012-12-25 (×8): 800 mg via ORAL
  Filled 2012-12-22 (×13): qty 1

## 2012-12-22 MED ORDER — ONDANSETRON HCL 4 MG/2ML IJ SOLN: 4.0000 mg | Freq: Four times a day (QID) | INTRAMUSCULAR | Status: DC | PRN

## 2012-12-22 MED ORDER — RENA-VITE PO TABS
1.0000 | ORAL_TABLET | Freq: Every day | ORAL | Status: DC
  Administered 2012-12-22 – 2012-12-24 (×3): 1 via ORAL
  Filled 2012-12-22 (×4): qty 1

## 2012-12-22 MED ORDER — DIALYVITE 3000 3 MG PO TABS: 1.0000 | ORAL_TABLET | Freq: Every day | ORAL | Status: DC

## 2012-12-22 MED ORDER — PHENYTOIN SODIUM EXTENDED 100 MG PO CAPS
200.0000 mg | ORAL_CAPSULE | Freq: Every day | ORAL | Status: DC
  Filled 2012-12-22: qty 2

## 2012-12-22 MED ORDER — ASPIRIN EC 81 MG PO TBEC
81.0000 mg | DELAYED_RELEASE_TABLET | Freq: Every day | ORAL | Status: DC
  Administered 2012-12-22 – 2012-12-25 (×3): 81 mg via ORAL
  Filled 2012-12-22 (×4): qty 1

## 2012-12-22 MED ORDER — ACETAMINOPHEN 650 MG RE SUPP: 650.0000 mg | Freq: Four times a day (QID) | RECTAL | Status: DC | PRN

## 2012-12-22 MED ORDER — HEPARIN SODIUM (PORCINE) 5000 UNIT/ML IJ SOLN
5000.0000 [IU] | Freq: Three times a day (TID) | INTRAMUSCULAR | Status: DC
  Administered 2012-12-22 – 2012-12-25 (×8): 5000 [IU] via SUBCUTANEOUS
  Filled 2012-12-22 (×13): qty 1

## 2012-12-22 MED ORDER — ACETAMINOPHEN 325 MG PO TABS: 650.0000 mg | ORAL_TABLET | Freq: Four times a day (QID) | ORAL | Status: DC | PRN

## 2012-12-22 MED ORDER — SODIUM CHLORIDE 0.9 % IV SOLN: Freq: Once | INTRAVENOUS | Status: DC

## 2012-12-22 MED ORDER — PANTOPRAZOLE SODIUM 40 MG PO TBEC
40.0000 mg | DELAYED_RELEASE_TABLET | Freq: Every day | ORAL | Status: DC
  Administered 2012-12-22 – 2012-12-23 (×2): 40 mg via ORAL
  Filled 2012-12-22 (×2): qty 1

## 2012-12-22 MED ORDER — ALUM & MAG HYDROXIDE-SIMETH 200-200-20 MG/5ML PO SUSP: 30.0000 mL | Freq: Four times a day (QID) | ORAL | Status: DC | PRN

## 2012-12-22 MED ORDER — MIDODRINE HCL 5 MG PO TABS: 15.0000 mg | ORAL_TABLET | Freq: Three times a day (TID) | ORAL | Status: DC

## 2012-12-22 MED ORDER — ONDANSETRON HCL 4 MG PO TABS: 4.0000 mg | ORAL_TABLET | Freq: Four times a day (QID) | ORAL | Status: DC | PRN

## 2012-12-22 MED ORDER — CLONAZEPAM 0.5 MG PO TABS
0.2500 mg | ORAL_TABLET | Freq: Two times a day (BID) | ORAL | Status: DC
  Administered 2012-12-22 – 2012-12-25 (×6): 0.25 mg via ORAL
  Filled 2012-12-22 (×4): qty 1
  Filled 2012-12-22: qty 2
  Filled 2012-12-22 (×2): qty 1

## 2012-12-22 MED ORDER — SODIUM CHLORIDE 0.9 % IV SOLN
250.0000 mg | Freq: Once | INTRAVENOUS | Status: DC
  Filled 2012-12-22: qty 5

## 2012-12-22 MED ORDER — PHENYTOIN SODIUM EXTENDED 100 MG PO CAPS
200.0000 mg | ORAL_CAPSULE | Freq: Once | ORAL | Status: DC
  Filled 2012-12-22: qty 2

## 2012-12-22 MED ORDER — CEFAZOLIN SODIUM 1-5 GM-% IV SOLN
1.0000 g | Freq: Once | INTRAVENOUS | Status: DC
  Filled 2012-12-22: qty 50

## 2012-12-22 MED ORDER — SODIUM CHLORIDE 0.9 % IV BOLUS (SEPSIS)
250.0000 mL | Freq: Once | INTRAVENOUS | Status: AC
  Administered 2012-12-22: 250 mL via INTRAVENOUS

## 2012-12-22 MED ORDER — SODIUM CHLORIDE 0.9 % IV SOLN
INTRAVENOUS | Status: AC
  Administered 2012-12-22: 23:00:00 via INTRAVENOUS

## 2012-12-22 MED ORDER — MIDODRINE HCL 5 MG PO TABS
15.0000 mg | ORAL_TABLET | Freq: Three times a day (TID) | ORAL | Status: DC
  Administered 2012-12-22 – 2012-12-25 (×10): 15 mg via ORAL
  Filled 2012-12-22 (×13): qty 3

## 2012-12-22 MED ORDER — PHENYTOIN 125 MG/5ML PO SUSP
200.0000 mg | Freq: Every day | ORAL | Status: DC
  Administered 2012-12-22 – 2012-12-25 (×4): 200 mg via ORAL
  Filled 2012-12-22 (×4): qty 8

## 2012-12-22 MED ORDER — SODIUM CHLORIDE 0.9 % IV SOLN
INTRAVENOUS | Status: DC
  Administered 2012-12-22 (×2): via INTRAVENOUS

## 2012-12-22 MED ORDER — ATORVASTATIN CALCIUM 20 MG PO TABS
20.0000 mg | ORAL_TABLET | Freq: Every day | ORAL | Status: DC
  Administered 2012-12-22 – 2012-12-24 (×3): 20 mg via ORAL
  Filled 2012-12-22 (×4): qty 1

## 2012-12-22 NOTE — H&P (Signed)
Hospital Admission Note Date: 12/22/2012  Patient name: [name] Medical record number: [id number] Date of birth: 05/16/1951 Age: 62 y.o. Gender: male PCP: Tacey Heap, MD  Medical Service: Internal Medicine teaching Service  Attending physician: Dr. Dalphine Handing  1st Contact: Dr. Lavena Bullion   Pager: [phone]  2nd Contact:     Pager: [phone]  After 5 pm or weekends:  1st Contact:     Pager: [phone]  2nd Contact:     Pager: [phone]  Chief Complaint:  Hypotension  History of Present Illness: 62yo M with ESRD, mental retardation who si nonverbal, and has a h/o seizure disorder presents to the ED after a witnessed seizure after HD today and was found to be hypotensive. Per his caretaker, he has been having multiple seizures daily since Sunday. The seizure today was witnessed by his family, and EMS was called due to concerns for continued seizure activity. On arrival of EMS, he was postictal. He does have a h/o seizure d/o and is on Dilantin, but he has reportedly poor compliance with his medications, esp lately. A new left femoral HD catheter was placed on 2/23, and since then he has had a poor appetite and has been selectively refusing to take some of his medications, including the Dilantin- his last dose was today. He is on HD for his ESRD and did receive dialysis today. On arrival to the ED, his blood pressure was 68/39. He was bolused 250cc of NS and started on NS@100cc /hr. His blood pressure improved to 94/54.    Meds: Current Outpatient Rx  Name  Route  Sig  Dispense  Refill  . clonazePAM (KLONOPIN) 0.5 MG tablet   Oral   Take 0.5 tablets (0.25 mg total) by mouth 2 (two) times daily.   30 tablet   3   . folic acid-vitamin b complex-vitamin c-selenium-zinc (DIALYVITE) 3 MG TABS   Oral   Take 1 tablet by mouth daily.         . midodrine (PROAMATINE) 10 MG tablet   Oral   Take 15 mg by mouth 3 (three) times daily.          . pantoprazole (PROTONIX) 40 MG tablet   Oral   Take 1  tablet (40 mg total) by mouth daily. Please note change to once a day.   90 tablet   3   . phenytoin (DILANTIN) 100 MG ER capsule   Oral   Take 200 mg by mouth at bedtime.         . sevelamer carbonate (RENVELA) 800 MG tablet   Oral   Take 800 mg by mouth 3 (three) times daily with meals.         Marland Kitchen atorvastatin (LIPITOR) 20 MG tablet   Oral   Take 20 mg by mouth at bedtime.            Allergies: Allergies as of 12/21/2012  . (No Known Allergies)   Past Medical History  Diagnosis Date  . Upper respiratory infection 12/12/2009  . Heart murmur, systolic 08/10/2009  . 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 Hypotension 05/26/2009  . Syncope 05/07/2009  . 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 Esophageal varices 11/07/2008  . Redness or discharge of eye 11/06/2008  . Skin tag 08/25/2008  . Gastric ulcer 10/09    antral, with h pylori positive  . Abdominal pain, acute, generalized 06/28/2008  . Congestive heart failure 03/06/2008  . Cellulitis and abscess of leg, except foot 03/06/2008  . Sinus tachycardia 03/06/2008  .  Edema 03/06/2008  . Impacted cerumen of both ears 03/06/2008  . Secondary hyperparathyroidism 02/02/2008  . Mute 02/02/2008  . Mental retardation 02/02/2008  . Hypertension 02/02/2008  . Hyperlipidemia 02/02/2008  . Anemia 02/02/2008  . ESRD (end stage renal disease)     TTS hemodialysis  . Seizures   . GERD (gastroesophageal reflux disease)    Past Surgical History  Procedure Laterality Date  . Left forearm graft      for HD  . Arteriovenous graft placement  11/22/10    Right thigh AVG  . Thrombectomy and revision of arterioventous (av) goretex  graft    . Thrombectomy and revision of arterioventous (av) goretex  graft  10/22/2012    Procedure: THROMBECTOMY AND REVISION OF ARTERIOVENTOUS (AV) GORETEX  GRAFT;  Surgeon: Larina Earthly, MD;  Location: [facility] OR;  Service: Vascular;  Laterality: Right;  . Thrombectomy w/ embolectomy  11/10/2012    Procedure:  THROMBECTOMY ARTERIOVENOUS GORE-TEX GRAFT;  Surgeon: Pryor Ochoa, MD;  Location: [facility] OR;  Service: Vascular;  Laterality: Right;  . Thrombectomy and revision of arterioventous (av) goretex  graft Right 12/08/2012    Procedure: THROMBECTOMY AND REVISION OF ARTERIOVENTOUS (AV) GORETEX  GRAFT right thigh;  Surgeon: Sherren Kerns, MD;  Location: [facility] OR;  Service: Vascular;  Laterality: Right;  Susie Cassette N/A 12/08/2012    Procedure: VENOGRAM;  Surgeon: Sherren Kerns, MD;  Location: [facility] OR;  Service: Vascular;  Laterality: N/A;  Intraoperative Central venogram  . Thrombectomy w/ embolectomy Right 12/12/2012    Procedure: THROMBECTOMY ARTERIOVENOUS GORE-TEX GRAFT;  Surgeon: Nada Libman, MD;  Location: [facility] OR;  Service: Vascular;  Laterality: Right;  . Insertion of dialysis catheter Left 12/14/2012    Procedure: INSERTION OF DIALYSIS CATHETER;  Surgeon: Nada Libman, MD;  Location: [facility] OR;  Service: Vascular;  Laterality: Left;   History reviewed. No pertinent family history. History   Social History  . Marital Status: Single    Spouse Name: N/A    Number of Children: N/A  . Years of Education: N/A   Occupational History  . Not on file.   Social History Main Topics  . Smoking status: Never Smoker   . Smokeless tobacco: Not on file  . Alcohol Use: No  . Drug Use: No  . Sexually Active: No   Other Topics Concern  . Not on file   Social History Narrative  . No narrative on file    Review of Systems: A 10 point ROS was performed; pertinent positives and negatives were noted in the HPI  Physical Exam: Blood pressure 94/54, pulse 94, temperature 98.5 F (36.9 C), temperature source Rectal, resp. rate 14, SpO2 100.00%. General: Alert, well-developed, and cooperative on examination.  Head: Normocephalic and atraumatic.  Eyes: Pupils equal, round, and reactive to light, injected sclera, anicteric.  Neck: Supple, full ROM Lungs: CTAB, normal respiratory effort, no accessory muscle  use, no crackles, and no wheezes. Heart: Regular rate, regular rhythm, no murmur, no gallop, and no rub.  Abdomen: Soft, non-tender, non-distended, hypoactive bowel sounds, no guarding, no rebound tenderness, no organomegaly.  Msk: No joint swelling, warmth, or erythema.  Extremities: 2+ radial pulses bilaterally. No cyanosis, clubbing, edema Neurologic: Alert, nonverbal, follows commands Skin: Turgor normal and no rashes.    Lab results: Basic Metabolic Panel:  Recent Labs  45/40/98 2210 12/21/12 2221  NA 144 142  K 3.3* 3.3*  CL 95* 96  CO2 29  --   GLUCOSE 195* 175*  BUN 17 18  CREATININE 5.76* 5.50*  CALCIUM 9.3  --    Liver Function Tests:  Recent Labs  12/21/12 2210  AST 57*  ALT 34  ALKPHOS 149*  BILITOT 0.3  PROT 8.0  ALBUMIN 3.5   CBC:  Recent Labs  12/21/12 2210 12/21/12 2221  WBC 6.0  --   NEUTROABS 4.0  --   HGB 9.5* 9.9*  HCT 28.6* 29.0*  MCV 93.8  --   PLT 201  --    Imaging results:  Dg Chest Port 1 View  12/21/2012  *RADIOLOGY REPORT*  Clinical Data: Seizures; chest pain.  PORTABLE CHEST - 1 VIEW  Comparison: Chest radiograph performed 12/14/2012  Findings: The lungs are hypoexpanded but appear grossly clear. There is no evidence of focal opacification, pleural effusion or pneumothorax.  The cardiomediastinal silhouette is within normal limits.  No acute osseous abnormalities are seen.  IMPRESSION: Hypoexpanded but grossly clear lungs.  No displaced rib fractures seen.   Original Report Authenticated By: Tonia Ghent, M.D.     Other results: EKG: Sinus tachycardia, rate 101, normal axis, prolonged QTc to 508.  Assessment & Plan by Problem: 62yo M with ESRD, mental retardation, and seizure disorder presents to the ED after a witnessed seizure after HD today and was found to be hypotensive.   1. Seizure disorder: Pt with h/o seizures, previously on only Klonopin. He was referred to Neurology in Nov '13, and was also started on Dilantin. In  the ED, his dilantin level was subtherapeutic; however, he did have dialysis today and has had poor medication compliance. Dilantin 1g IV x1 given in the ED. Will restart home Dilantin dose. His lactic acid was elevated on admission, possibly 2/2 seizure. - Dilantin 200mg  po qhs, but can increase to 250-300mg  in setting of subtherapeutic levels - Klonopin 0.5mg  BID - Consider Neurology consult   2. Hypotension: Blood pressure appears to run low with SBP 80-120s and DBP 50-70s at baseline. Likely contributed by dialysis and the Dilantin. Will continue IVF and bolus if needed. - NS@50cc /hr x10hrs  3. ESRD: On HD TThSa. Renal will need to be notified if the patient stays in the hospital through Thursday, for inpatient dialysis.   4. DVT PPx: Heparin  Dispo: Disposition is deferred at this time, awaiting improvement of current medical problems. Anticipated discharge in approximately 1-2 day(s).   The patient does have a current PCP (NUTAN, FNU, MD), therefore will be requiring OPC follow-up after discharge.   The patient does not know have transportation limitations that hinder transportation to clinic appointments.  Signed: Genelle Gather 12/22/2012, 1:20 AM

## 2012-12-22 NOTE — Progress Notes (Signed)
Subjective: Patient is nonverbal.   RN states patient will not take his medications.    Spoke with sister patient has had 2-3 seizures Sunday, 1 Monday, 1-2 Tuesday.  He was not taking seizure medications at home.  Carroll Sage takes care of him.  Her brother does not speak.  She says her brother is stubborn, he can dress himself, he can feed himself.  Sister states he has been in the hospital 1-2  X per week for the past month.    Objective: Vital signs in last 24 hours: Filed Vitals:   12/22/12 0207 12/22/12 0230 12/22/12 0300 12/22/12 0638  BP:  93/48  81/48  Pulse: 93 51  90  Temp:  97.4 F (36.3 C)  97.4 F (36.3 C)  TempSrc:  Oral  Oral  Resp: 13 18  18   Height:   5' (1.524 m)   Weight:   121 lb 11.1 oz (55.2 kg)   SpO2: 100% 100%  95%   Weight change:  No intake or output data in the 24 hours ending 12/22/12 1103 General: lying in bed eyes closed, nonverbal HEENT: [place]/at  CV: RRR no rubs, murmurs or gallops Lungs: ctab Abdomen: soft ntnd, normal bs  Extremities: warm no cyanosis or edema, left thigh HD site without evidence of infection Neuro: moving all 4 extremities, follows commands (i.e eyes open, open mouth, withdraws to pain all 4 extremities).   Lab Results: Basic Metabolic Panel:  Recent Labs Lab 12/21/12 2210 12/21/12 2221 12/22/12 0530  NA 144 142 143  K 3.3* 3.3* 3.6  CL 95* 96 102  CO2 29  --  27  GLUCOSE 195* 175* 137*  BUN 17 18 22   CREATININE 5.76* 5.50* 6.60*  CALCIUM 9.3  --  8.8   Liver Function Tests:  Recent Labs Lab 12/21/12 2210 12/22/12 0530  AST 57* 68*  ALT 34 44  ALKPHOS 149* 155*  BILITOT 0.3 0.2*  PROT 8.0 7.3  ALBUMIN 3.5 3.2*   CBC:  Recent Labs Lab 12/21/12 2210 12/21/12 2221 12/22/12 0530  WBC 6.0  --  6.7  NEUTROABS 4.0  --   --   HGB 9.5* 9.9* 8.6*  HCT 28.6* 29.0* 25.7*  MCV 93.8  --  94.8  PLT 201  --  191   Misc. Labs: None   Micro Results: No results found for this or any previous visit (from  the past 240 hour(s)).  Studies/Results: Dg Chest Port 1 View  12/21/2012  *RADIOLOGY REPORT*  Clinical Data: Seizures; chest pain.  PORTABLE CHEST - 1 VIEW  Comparison: Chest radiograph performed 12/14/2012  Findings: The lungs are hypoexpanded but appear grossly clear. There is no evidence of focal opacification, pleural effusion or pneumothorax.  The cardiomediastinal silhouette is within normal limits.  No acute osseous abnormalities are seen.  IMPRESSION: Hypoexpanded but grossly clear lungs.  No displaced rib fractures seen.   Original Report Authenticated By: Tonia Ghent, M.D.    Medications:  Scheduled Meds: . sodium chloride   Intravenous Once  . aspirin EC  81 mg Oral Daily  . atorvastatin  20 mg Oral QHS  . clonazePAM  0.25 mg Oral BID  . heparin  5,000 Units Subcutaneous Q8H  . midodrine  15 mg Oral TID WC  . multivitamin  1 tablet Oral QHS  . pantoprazole  40 mg Oral Daily  . phenytoin  200 mg Oral Daily  . phenytoin  200 mg Oral Once  . sevelamer carbonate  800 mg  Oral TID WC   Continuous Infusions: . sodium chloride 125 mL/hr at 12/22/12 0930   PRN Meds:.acetaminophen, acetaminophen, alum & mag hydroxide-simeth, morphine injection, ondansetron (ZOFRAN) IV, ondansetron Assessment/Plan: 62yo M with ESRD, chronic hypotension, mental retardation, and seizure disorder presents to the ED after a witnessed seizure after HD today and was found to be hypotensive  1. Seizure d/o  -Recently noncompliant with antiepileptic medications -Klonopin 0.25 mg bid, bolused Dilantin then placed on Dilantin suspension 200 mg qd  -Patient is refusing medications and food  -will need to follow up outpatient with Neurology  2. Acute on chronic Hypotension -BP 69/39 on admission.  Baseline BP sbp 90s -monitor BP -Midodrine 15 mg tid  -on IVF 125 cc/hr  3. ESRD on HD  -He had HD Tuesday via cath in left thigh  -Renal aware  4. QT prolongation  -repeat EKG in the am   5. Elevated  transaminases  -pending CMET in the am to follow, pending ggt   6. F/E/N -NS 125 cc/hr  -will monitor and replace electrolytes  -diet ordered but patient refusing to eat    7. DVT -Heparin 5K tid   Dispo: Disposition is deferred at this time, awaiting improvement of current medical problems.  Anticipated discharge in approximately 2-3 day(s).   The patient does have a current PCP (NUTAN, FNU, MD), therefore will be requiring OPC follow-up after discharge.   The patient does not have transportation limitations that hinder transportation to clinic appointments.  .Services Needed at time of discharge: Y = Yes, Blank = No PT:   OT:   RN:   Equipment:   Other:     LOS: 1 day   Annett Gula [phone] 12/22/2012, 11:03 AM

## 2012-12-22 NOTE — Progress Notes (Signed)
Around 2000 Patients blood pressure was 68/38 on Lt arm and 70/42 on Rt arm. Heart rate in 80s. Patient resting good, not seems to be in distress. MD notified. New order received. 250 ml bolus given. Blood pressure rechecked and was 78/43. Notified again. New order received. Will continue to monitor the patient.

## 2012-12-22 NOTE — Progress Notes (Signed)
Pts sister and care giver here,  Waiting to talk with doctor.

## 2012-12-22 NOTE — Progress Notes (Signed)
Attempted to do catherization for urine specimen.  Unable to obtain specimen, when bladder scan was done, showed patient had 21cc of urine in bladder.

## 2012-12-22 NOTE — Plan of Care (Signed)
Spoke with Dr. Westley Hummer regarding his dilantin.   Patient has been refusing his dilantin here and also has been non compliant at home( as per the care giver he has not been taking his meds as he should be at home). Dilantin levels sub- therpeutic ( 2.6)  She suggested to try dilantin suspension or chewable dilantin tabs at the current dose of 200 mg daily. Patient should be given dilantin after his HD.  FYI: PO dilantin has half life of 12-18 hrs as opposed to IV which has half life of 8- 10 hrs.   Thanks, [employer]

## 2012-12-22 NOTE — Progress Notes (Signed)
MD paged regarding pt's refusal to take Dilantin PO. MD on call ordered to hold PO Dilantin for now and IV will be ordered to be given in am. Will continue to do neuro checks and monitor for seizure activity. Salvadore Oxford, RN [phone] 12/22/12

## 2012-12-22 NOTE — H&P (Signed)
Internal Medicine Teaching Service Attending Note Date: 12/22/2012  Patient name: BENEN WEIDA  Medical record number: [id number]  Date of birth: October 30, 1950   History: The patient, RAMCES SHOMAKER, is a 62 y.o. year old male, with past medical history of ERSD, mental retardation (non-verbal at baseline), seizures on dilantin, who comes in with the chief  complaint of having had a seizure . I have read the history documented by Dr. Sherrine Maples and I concur with the chronology of events.   When I met with the patient today, he was non-verbal. He lay on his bed with eyes closed, and did not open them on my repeated asking. He was not cooperative and did not obey any commands. RN told me that he was refusing to take his meds.  Pat medical history, social history and medications have been reviewed.   Review of systems as per HPI and resident note.   Vitals:  Filed Vitals:   12/22/12 0638  BP: 81/48  Pulse: 90  Temp: 97.4 F (36.3 C)  Resp: 18    Exam:  I met with patient around 11 am today. Limited exam due to non-cooperation.  General: Resting in bed. Heart: RRR, no rubs, murmurs or gallops. Lungs: Clear to auscultation bilaterally, no wheezes, rales, or rhonchi. Abdomen: Soft, nontender, nondistended, BS present. Extremities: Warm, no pedal edema. Right thigh graft, no thrill.  Neuro: moving all extremties, non verbal, non-cooperative. Responds to pain, withdraws to touch showing displeasure or not wanting to be bothered.   Labs:  [facility]   Recent Labs Lab 12/21/12 2210 12/21/12 2221 12/22/12 0530  NA 144 142 143  K 3.3* 3.3* 3.6  CL 95* 96 102  CO2 29  --  27  GLUCOSE 195* 175* 137*  BUN 17 18 22   CREATININE 5.76* 5.50* 6.60*  CALCIUM 9.3  --  8.8    Recent Labs Lab 12/21/12 2210 12/22/12 0530  AST 57* 68*  ALT 34 44  ALKPHOS 149* 155*  BILITOT 0.3 0.2*  PROT 8.0 7.3  ALBUMIN 3.5 3.2*    CBC  Recent Labs Lab 12/21/12 2210 12/21/12 2221 12/22/12 0530  HGB 9.5*  9.9* 8.6*  HCT 28.6* 29.0* 25.7*  WBC 6.0  --  6.7  PLT 201  --  191   Dilantin levels 2.6  Imaging: Dg Chest Port 1 View  12/21/2012  *RADIOLOGY REPORT*  Clinical Data: Seizures; chest pain.  PORTABLE CHEST - 1 VIEW  Comparison: Chest radiograph performed 12/14/2012  Findings: The lungs are hypoexpanded but appear grossly clear. There is no evidence of focal opacification, pleural effusion or pneumothorax.  The cardiomediastinal silhouette is within normal limits.  No acute osseous abnormalities are seen.  IMPRESSION: Hypoexpanded but grossly clear lungs.  No displaced rib fractures seen.   Original Report Authenticated By: Tonia Ghent, M.D.     EKG Sinus tachycardia at 101 and prolonged QTc at 508.  ASSESSMENT AND PLAN   This is a 62 y.o. year old male with the following problems:  - Seizure activity secondary to non-compliance. 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 Dilantin loading dose given, however the patient is refusing further dosing. He may not be competent enough to  judge what is best for him. We need to speak with POA and decide on course of care.   Hypotension resolved, patient keeps borderline low blood pressures at baseline. Dialysis to continue as per schedule.  We will repeat EKG to follow up on prolonged QTc.   Other chronic issues per resident note.    Thanks, Aletta Edouard, MD 3/5/20143:10 PM

## 2012-12-22 NOTE — Progress Notes (Signed)
Pt seems more alert this evening, did eat all of his dinner, except for bread.  Refused to take his Renagal.

## 2012-12-23 LAB — COMPREHENSIVE METABOLIC PANEL
ALT: 34 U/L (ref 0–53)
AST: 39 U/L — ABNORMAL HIGH (ref 0–37)
CO2: 25 mEq/L (ref 19–32)
Chloride: 105 mEq/L (ref 96–112)
GFR calc non Af Amer: 6 mL/min — ABNORMAL LOW (ref >90)
Potassium: 4.5 mEq/L (ref 3.5–5.1)
Sodium: 146 mEq/L — ABNORMAL HIGH (ref 135–145)
Total Bilirubin: 0.3 mg/dL (ref 0.3–1.2)

## 2012-12-23 MED ORDER — DOXERCALCIFEROL 4 MCG/2ML IV SOLN
6.0000 ug | INTRAVENOUS | Status: DC
  Administered 2012-12-23 – 2012-12-25 (×2): 6 ug via INTRAVENOUS
  Filled 2012-12-23 (×2): qty 4

## 2012-12-23 MED ORDER — NEPRO/CARBSTEADY PO LIQD
237.0000 mL | Freq: Two times a day (BID) | ORAL | Status: DC
  Filled 2012-12-23 (×3): qty 237

## 2012-12-23 MED ORDER — NEPRO/CARBSTEADY PO LIQD
237.0000 mL | Freq: Two times a day (BID) | ORAL | Status: DC
  Filled 2012-12-23 (×7): qty 237

## 2012-12-23 MED ORDER — DARBEPOETIN ALFA-POLYSORBATE 100 MCG/0.5ML IJ SOLN
100.0000 ug | INTRAMUSCULAR | Status: DC
  Administered 2012-12-23: 100 ug via INTRAVENOUS
  Filled 2012-12-23: qty 0.5

## 2012-12-23 MED ORDER — ALBUMIN HUMAN 25 % IV SOLN
25.0000 g | Freq: Once | INTRAVENOUS | Status: AC
  Administered 2012-12-23: 25 g via INTRAVENOUS

## 2012-12-23 MED ORDER — SODIUM CHLORIDE 0.9 % IV SOLN
125.0000 mg | INTRAVENOUS | Status: DC
  Administered 2012-12-23 – 2012-12-25 (×2): 125 mg via INTRAVENOUS
  Filled 2012-12-23 (×3): qty 10

## 2012-12-23 MED ORDER — ASPIRIN 81 MG PO TBEC: 81.0000 mg | DELAYED_RELEASE_TABLET | Freq: Every day | ORAL | Status: DC

## 2012-12-23 NOTE — Progress Notes (Signed)
Pt. Appears more alert,  Taking meds without difficulty, eatting meals, although still not feeding self.  At 1:30 pt to dialysis via bed.

## 2012-12-23 NOTE — Consult Note (Signed)
[facility] Renal Consultation Note    Indication for Consultation:  Management of ESRD/hemodialysis; anemia, hypertension/volume and secondary hyperparathyroidism  HPI: [name] is a 62 y.o.AA male with ESRD on TTS dialysis, mentally disabled and mute who presented to the ED the evening of 3/4 after having had dialysis earlier in the day. EMS was called and pt was transported to the hospital. According to records, EMS said he was not post ictal, BS > 100 and BP was low and is now at baseline. He is due for dialysis today.  Past Medical History  Diagnosis Date  . Upper respiratory infection 12/12/2009  . Heart murmur, systolic 08/10/2009  . 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 Hypotension 05/26/2009  . Syncope 05/07/2009  . 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 Esophageal varices 11/07/2008  . Redness or discharge of eye 11/06/2008  . Skin tag 08/25/2008  . Gastric ulcer 10/09    antral, with h pylori positive  . Abdominal pain, acute, generalized 06/28/2008  . Congestive heart failure 03/06/2008  . Cellulitis and abscess of leg, except foot 03/06/2008  . Sinus tachycardia 03/06/2008  . Edema 03/06/2008  . Impacted cerumen of both ears 03/06/2008  . Secondary hyperparathyroidism 02/02/2008  . Mute 02/02/2008  . Mental retardation 02/02/2008  . Hypertension 02/02/2008  . Hyperlipidemia 02/02/2008  . Anemia 02/02/2008  . ESRD (end stage renal disease)     TTS hemodialysis  . Seizures   . GERD (gastroesophageal reflux disease)    Past Surgical History  Procedure Laterality Date  . Left forearm graft      for HD  . Arteriovenous graft placement  11/22/10    Right thigh AVG  . Thrombectomy and revision of arterioventous (av) goretex  graft    . Thrombectomy and revision of arterioventous (av) goretex  graft  10/22/2012    Procedure: THROMBECTOMY AND REVISION OF ARTERIOVENTOUS (AV) GORETEX  GRAFT;  Surgeon: Larina Earthly, MD;  Location: [facility] OR;  Service: Vascular;   Laterality: Right;  . Thrombectomy w/ embolectomy  11/10/2012    Procedure: THROMBECTOMY ARTERIOVENOUS GORE-TEX GRAFT;  Surgeon: Pryor Ochoa, MD;  Location: [facility] OR;  Service: Vascular;  Laterality: Right;  . Thrombectomy and revision of arterioventous (av) goretex  graft Right 12/08/2012    Procedure: THROMBECTOMY AND REVISION OF ARTERIOVENTOUS (AV) GORETEX  GRAFT right thigh;  Surgeon: Sherren Kerns, MD;  Location: [facility] OR;  Service: Vascular;  Laterality: Right;  Susie Cassette N/A 12/08/2012    Procedure: VENOGRAM;  Surgeon: Sherren Kerns, MD;  Location: [facility] OR;  Service: Vascular;  Laterality: N/A;  Intraoperative Central venogram  . Thrombectomy w/ embolectomy Right 12/12/2012    Procedure: THROMBECTOMY ARTERIOVENOUS GORE-TEX GRAFT;  Surgeon: Nada Libman, MD;  Location: [facility] OR;  Service: Vascular;  Laterality: Right;  . Insertion of dialysis catheter Left 12/14/2012    Procedure: INSERTION OF DIALYSIS CATHETER;  Surgeon: Nada Libman, MD;  Location: [facility] OR;  Service: Vascular;  Laterality: Left;   History reviewed. No pertinent family history. - unable to obtain from patient Social History: Pt has a primary caregiver, Jerrye Beavers  reports that he has never smoked. He does not have any smokeless tobacco history on file. He reports that he does not drink alcohol or use illicit drugs. No Known Allergies Prior to Admission medications   Medication Sig Start Date End Date Taking? Authorizing Provider  clonazePAM (KLONOPIN) 0.5 MG tablet Take 0.5 tablets (0.25 mg total) by mouth 2 (two) times daily. 11/15/12  Yes Aletta Edouard, MD  folic acid-vitamin b complex-vitamin c-selenium-zinc (DIALYVITE) 3 MG TABS Take 1 tablet by mouth daily.   Yes Historical Provider, MD  midodrine (PROAMATINE) 10 MG tablet Take 15 mg by mouth 3 (three) times daily.    Yes Historical Provider, MD  pantoprazole (PROTONIX) 40 MG tablet Take 1 tablet (40 mg total) by mouth daily. Please note change to once a day. 07/14/12  Yes  Mathis Dad, MD  phenytoin (DILANTIN) 100 MG ER capsule Take 200 mg by mouth at bedtime.   Yes Historical Provider, MD  sevelamer carbonate (RENVELA) 800 MG tablet Take 800 mg by mouth 3 (three) times daily with meals.   Yes Historical Provider, MD  atorvastatin (LIPITOR) 20 MG tablet Take 20 mg by mouth at bedtime.     Historical Provider, MD   Current Facility-Administered Medications  Medication Dose Route Frequency Provider Last Rate Last Dose  . 0.9 %  sodium chloride infusion   Intravenous Once Robet Leu, PA-C 125 mL/hr at 12/22/12 0700    . acetaminophen (TYLENOL) tablet 650 mg  650 mg Oral Q6H PRN Elyse Jarvis, MD       Or  . acetaminophen (TYLENOL) suppository 650 mg  650 mg Rectal Q6H PRN Elyse Jarvis, MD      . alum & mag hydroxide-simeth (MAALOX/MYLANTA) 200-200-20 MG/5ML suspension 30 mL  30 mL Oral Q6H PRN Elyse Jarvis, MD      . aspirin EC tablet 81 mg  81 mg Oral Daily Elyse Jarvis, MD   81 mg at 12/22/12 1026  . atorvastatin (LIPITOR) tablet 20 mg  20 mg Oral QHS Elyse Jarvis, MD   20 mg at 12/22/12 2210  . clonazePAM (KLONOPIN) tablet 0.25 mg  0.25 mg Oral BID Elyse Jarvis, MD   0.25 mg at 12/22/12 2210  . heparin injection 5,000 Units  5,000 Units Subcutaneous Q8H Elyse Jarvis, MD   5,000 Units at 12/23/12 0631  . midodrine (PROAMATINE) tablet 15 mg  15 mg Oral TID WC Aletta Edouard, MD   15 mg at 12/23/12 0802  . morphine 2 MG/ML injection 2 mg  2 mg Intravenous Q4H PRN Elyse Jarvis, MD      . multivitamin (RENA-VIT) tablet 1 tablet  1 tablet Oral QHS Aletta Edouard, MD   1 tablet at 12/22/12 2208  . ondansetron (ZOFRAN) tablet 4 mg  4 mg Oral Q6H PRN Elyse Jarvis, MD       Or  . ondansetron (ZOFRAN) injection 4 mg  4 mg Intravenous Q6H PRN Elyse Jarvis, MD      . pantoprazole (PROTONIX) EC tablet 40 mg  40 mg Oral Daily Elyse Jarvis, MD   40 mg at 12/22/12 1025  . phenytoin (DILANTIN) 125 MG/5ML suspension 200 mg  200 mg Oral Daily Elyse Jarvis, MD    200 mg at 12/22/12 1015  . phenytoin (DILANTIN) ER capsule 200 mg  200 mg Oral Once Aletta Edouard, MD      . sevelamer carbonate (RENVELA) tablet 800 mg  800 mg Oral TID WC Elyse Jarvis, MD   800 mg at 12/23/12 0802   Labs: Basic Metabolic Panel:  Recent Labs Lab 12/21/12 2210 12/21/12 2221 12/22/12 0530 12/23/12 0617  NA 144 142 143 146*  K 3.3* 3.3* 3.6 4.5  CL 95* 96 102 105  CO2 29  --  27 25  GLUCOSE 195* 175* 137* 89  BUN 17 18 22  28*  CREATININE 5.76* 5.50* 6.60* 8.77*  CALCIUM  9.3  --  8.8 9.5  PHOS  --   --   --  5.8*   Liver Function Tests:  Recent Labs Lab 12/21/12 2210 12/22/12 0530 12/23/12 0617  AST 57* 68* 39*  ALT 34 44 34  ALKPHOS 149* 155* 147*  BILITOT 0.3 0.2* 0.3  PROT 8.0 7.3 6.8  ALBUMIN 3.5 3.2* 3.1*  CBC:  Recent Labs Lab 12/21/12 2210 12/21/12 2221 12/22/12 0530  WBC 6.0  --  6.7  NEUTROABS 4.0  --   --   HGB 9.5* 9.9* 8.6*  HCT 28.6* 29.0* 25.7*  MCV 93.8  --  94.8  PLT 201  --  191  Studies/Results: Dg Chest Port 1 View  12/21/2012  *RADIOLOGY REPORT*  Clinical Data: Seizures; chest pain.  PORTABLE CHEST - 1 VIEW  Comparison: Chest radiograph performed 12/14/2012  Findings: The lungs are hypoexpanded but appear grossly clear. There is no evidence of focal opacification, pleural effusion or pneumothorax.  The cardiomediastinal silhouette is within normal limits.  No acute osseous abnormalities are seen.  IMPRESSION: Hypoexpanded but grossly clear lungs.  No displaced rib fractures seen.   Original Report Authenticated By: Tonia Ghent, M.D.    ROS: Unable to obtain due to being mute  Physical Exam: Filed Vitals:   12/22/12 2200 12/23/12 0038 12/23/12 0231 12/23/12 0522  BP: 74/42 71/47 85/57  98/64  Pulse:  84 81 82  Temp: 97.4 F (36.3 C)  97.5 F (36.4 C) 97.7 F (36.5 C)  TempSrc:   Oral Oral  Resp:   20 20  Height:      Weight:      SpO2:   100% 100%     General: Well developed, well nourished, in no acute distress  supine in bed Head: Normocephalic, atraumatic, sclera non-icteric, exothamic Neck: Supple. JVD not elevated. Lungs: Clear bilaterally to auscultation without wheezes, rales, or rhonchi. Breathing is unlabored. Heart: RRR 3/6 loudest at the LLSB Abdomen: Soft, non-tender, non-distended with + BS frim mobile mass in LLQ,doesn't exactly feel like colon Extremities: upper - mittens on hands; lower - no edema or ischemic changes, no open wounds  Neuro: Alert  Nonverbal - flat (has been more nonverbally animated in the outpatient setting at baseline) Dialysis Access: right thigh tunneled catheter  Dialysis Orders: Center: [facility]  on TTS . Right thigh catheter EDW  56HD Bath 2K 2 Ca  Time 3.75 Heparin 2400  Access catheter 400/ A 1.5 Optiflux 16    hectorol 6 Epogen none  Venofer none Other profile 4  Hgb 9.2 2/27 Tsat 25% 2/20 with ferritin 419 in January -  Assessment/Plan: 1.  Seizures - secondary pt's refusal to take meds; need to follow free dilantin levels in dialysis patients 2.  ESRD -  TTS [facility] - recently had several decots of thigh graft( twice in Feb and twice in Jan) which has ultimately failed and now has a catheter; HD today per routine 3.  Chronic Hypotension/volume  - BP runs lowish at dialysis usually 90s - on midodrine 15 tid; ? If he has had a recent Echo; below EDW upon admission; weigh pre HD today - he can do a standing weight 4.  Anemia  - had been off EPO ; Hgb drop likely due to multiple vascular procedures, but will check FOBT - start Aranesp;  5.  Metabolic bone disease -  Continue hectorol and binders 6.  Nutrition - renal diet + nepro 7.    ^ LFT - GGT 137 -  other LFTs as above;  Review of outpt records show Hep C neg and Hgp b AB > 150 in January - AST and ALT in 20s in January 8.     Hx of presumed adrenal insufficiency, previously on hydrocortisone  Per outpt problem list; not on med list here; check cortisol pre HD today. Sheffield Slider, PA-C [facility] Beeper [phone] 12/23/2012, 10:30 AM   I have seen and examined this patient and agree with plan as outlined by M. Doran Durand, PA-C.  Pt has had ongoing issues with hypotension, now with more pronounced murmer.  Will order 2D ECHO as this has not been done in almost 4 years.  Pt lethargic but arousable.  Plan for HD later today.Marland Kitchen Willona Phariss A,MD 12/23/2012 12:02 PM

## 2012-12-23 NOTE — Progress Notes (Signed)
Internal Medicine Teaching Service Attending Note Date: 12/23/2012  Patient name: [name]  Medical record number: [id number]  Date of birth: [birth year]  Patient non verbal. Does not follow commands for me. Wants to sleep it seems. We persuaded the patient to have dilantin yesterday. No seizures. No other acute events reported.   Filed Vitals:   12/23/12 1030 12/23/12 1335 12/23/12 1347 12/23/12 1400  BP: 133/108 76/42 72/45 70/40   Pulse: 90 87 87 82  Temp: 97.8 F (36.6 C) 98.1 F (36.7 C)    TempSrc: Oral Oral    Resp: 20 20    Height:      Weight: 129 lb 6.6 oz (58.7 kg)     SpO2: 99% 100%      Exam unchanged from yesterday.    Recent Labs Lab 12/21/12 2210 12/21/12 2221 12/22/12 0530 12/23/12 0617  NA 144 142 143 146*  K 3.3* 3.3* 3.6 4.5  CL 95* 96 102 105  CO2 29  --  27 25  GLUCOSE 195* 175* 137* 89  BUN 17 18 22  28*  CREATININE 5.76* 5.50* 6.60* 8.77*  CALCIUM 9.3  --  8.8 9.5  MG  --   --   --  2.1  PHOS  --   --   --  5.8*    Recent Labs Lab 12/21/12 2210 12/21/12 2221 12/22/12 0530  HGB 9.5* 9.9* 8.6*  HCT 28.6* 29.0* 25.7*  WBC 6.0  --  6.7  PLT 201  --  191     Dialysis today. Will monitor BP after dialysis. If stable, will discharge on dilantin.    Rest per resident note. Care plan discussed with team.    Dalphine Handing, [facility] 12/23/2012, 2:08 PM.

## 2012-12-23 NOTE — Procedures (Signed)
[facility]  On HD via L fem TDC BP--77/42--BP usually runs low at outpatient HD ctr, but not usually this low.  Rx IV albumin Goal--2.7 L Hgb--8.6.  On Aranesp 100/wk; Fe/TIBC 25% on 20 Feb ,          ferritin 419( on 23 Jan  at [facility])--will rx IV Fe 125 x 8  K+--4.5 No c/o

## 2012-12-23 NOTE — Discharge Summary (Signed)
Addendum on 12/25/12, since d/c was postponed for echo.  Internal Medicine [facility] Discharge Note  Name: [name] MRN: [id number] DOB: April 16, 1951 62 y.o.  Date of Admission: 12/21/2012  9:05 PM Date of Discharge: 12/25/2012 Attending Physician: Aletta Edouard, MD  Discharge Diagnosis: 1. Seizure disorder 2. Acute on chronic Hypotension (on Midodrine) 3. ESRD on HD  4. QT prolongation, resolved  5. Elevated transaminases   Discharge Medications:   Medication List    TAKE these medications       aspirin 81 MG EC tablet  Take 1 tablet (81 mg total) by mouth daily.     atorvastatin 20 MG tablet  Commonly known as:  LIPITOR  Take 20 mg by mouth at bedtime.     clonazePAM 0.5 MG tablet  Commonly known as:  KLONOPIN  Take 0.5 tablets (0.25 mg total) by mouth 2 (two) times daily.     folic acid-vitamin b complex-vitamin c-selenium-zinc 3 MG Tabs  Take 1 tablet by mouth daily.     midodrine 10 MG tablet  Commonly known as:  PROAMATINE  Take 15 mg by mouth 3 (three) times daily.     pantoprazole 40 MG tablet  Commonly known as:  PROTONIX  Take 1 tablet (40 mg total) by mouth daily. Please note change to once a day.     phenytoin 100 MG ER capsule  Commonly known as:  DILANTIN  Take 200 mg by mouth at bedtime.     sevelamer carbonate 800 MG tablet  Commonly known as:  RENVELA  Take 800 mg by mouth 3 (three) times daily with meals.        Disposition and follow-up:   [name] was discharged from [facility] in stable condition.  At the hospital follow up visit please address  1) Refer to outpatient physical therapy  2) Refer for outpatient Neurology if still actively having seizures  3) check free Dilantin level (ESRD pt), follow up CMET (repeat CMET), work up for elevated transaminases and ggt 4) F/u hepatitis panel, free phenytoin level, cortisol level (history of presumed adrenal insuff on problem list and supposedly on  hydrocortisone in the past, not currently.    Follow-up Appointments: Follow-up Information   Follow up with Genella Mech, MD On 01/05/2013. (9:45am)    Contact information:   [address] [place] [address] [phone]      Discharge Orders   Future Appointments Provider Department Dept Phone   01/05/2013 9:45 AM Judie Bonus, MD [facility] [phone]   01/10/2013 1:30 PM Nada Libman, MD [facility] [phone]   Future Orders Complete By Expires     Discharge instructions  As directed     Comments:      Follow up with Internal Medicine clinic 01/05/13 at 9:45  Am [phone]       Consultations: Treatment Team:  Irena Cords, MD - renal  Procedures Performed:  Dg Chest [facility] 12/21/2012  .  IMPRESSION: Hypoexpanded but grossly clear lungs.  No displaced rib fractures seen.   Original Report Authenticated By: Tonia Ghent, M.D.    Echo 12/24/12 Study Conclusions Left ventricle: Small LVOT gradient from hyperdynamic LV function no true SAM The cavity size was normal. There was mild concentric hypertrophy. Systolic function was normal. The estimated ejection fraction was in the range of 60% to 65%. Wall motion was normal; there were no regional wall  motion abnormalities.    Admission HPI:  Chief complaint: hypotension, seizure  62yo M with ESRD, mental retardation who is nonverbal, and has a h/o seizure disorder presents to the ED after a witnessed seizure after HD today and was found to be hypotensive. Per his caretaker, he has been having multiple seizures daily since Sunday. The seizure today was witnessed by his family, and EMS was called due to concerns for continued seizure activity. On arrival of EMS, he was postictal. He does have a h/o seizure d/o and is on Dilantin, but he has reportedly poor compliance with his medications, especially lately (refusing medications and food  prior to admission). A new left femoral HD catheter was placed on 2/23, and since then he has had a poor appetite and has been selectively refusing to take some of his medications, including the Dilantin- his last dose was today. He is on HD for his ESRD and did receive dialysis today. On arrival to the ED, his blood pressure was 68/39. He was bolused 250cc of NS and started on NS@100cc /hr. His blood pressure improved to 94/54.  Review of Systems:  A 10 point ROS was performed; pertinent positives and negatives were noted in the HPI  Physical Exam:  Blood pressure 94/54, pulse 94, temperature 98.5 F (36.9 C), temperature source Rectal, resp. rate 14, SpO2 100.00%.  General: Alert, well-developed, and cooperative on examination.  Head: Normocephalic and atraumatic.  Eyes: Pupils equal, round, and reactive to light, injected sclera, anicteric.  Neck: Supple, full ROM  Lungs: CTAB, normal respiratory effort, no accessory muscle use, no crackles, and no wheezes. Heart: Regular rate, regular rhythm, no murmur, no gallop, and no rub.  Abdomen: Soft, non-tender, non-distended, hypoactive bowel sounds, no guarding, no rebound tenderness, no organomegaly.  Msk: No joint swelling, warmth, or erythema.  Extremities: 2+ radial pulses bilaterally. No cyanosis, clubbing, edema Neurologic: Alert, nonverbal, follows commands  Skin: Turgor normal and no rashes.     Hospital Course by problem list: 1. Seizure disorder 2. Acute on chronic Hypotension (on Midodrine) 3. ESRD on HD  4. QT prolongation, resolved  5. Elevated transaminases   61yo male with ESRD, chronic hypotension, mental retardation, and seizure disorder presents to the ED after a witnessed seizure after dialysis Tuesday prior to [facility] and he was found to be hypotensive 69/39.     1. Seizure disorder Recently noncompliant with antiepileptic medications.  No further witnessed seizures this admission.  Restarted on Klonopin 0.25 mg bid,  bolused Dilantin then placed on Dilantin suspension 200 mg qd which initially had to be forced but patient started taking medications on Day 2 of admission.  If he is still actively seizing in the future he will need outpatient with Neurology for titration of medications.    2. Acute on chronic Hypotension  On admission 69/39 was his blood pressure.   Baseline BP sbp 80s-90s/50-70s and he is on Midodrine 15 mg tid.  We started him on Normal Saline 125 cc/hr then 50 cc/hr with improvement in his blood pressure to 90s systolic.  Follow cortisol level - history of presumed adrenal insuff on problem list and supposedly on hydrocortisone in the past, not currently.   3. ESRD on HD  Last Tuesday prior to admission had dialysis via catheter in left thigh.  He had dialysis on day of discharge 12/25/12 with [facility].     4. QT prolongation, resolved Initial EKG with QTc 508 which trended down to 470.   5. Elevated  transaminases  Alkaline phosphatase and transaminases elevated on admission though trending down.  He also had an elevated ggt this admission.  This needs further work up outpatient.  Pending hepatitis panel to follow.  Negative in 2010. Dilantin may also cause elevated transaminases or hepatitis, but enzymes trended down despite dilantin in hospital so less likely.  He was given Heparin 5K tid for DVT prophylaxis.     Discharge Vitals:  BP 84/54  Pulse 92  Temp(Src) 98.1 F (36.7 C) (Oral)  Resp 20  Ht 5' (1.524 m)  Wt 129 lb 6.6 oz (58.7 kg)  BMI 25.27 kg/m2  SpO2 100% General: lying in bed nonverbal, nad  HEENT: exopthalmos, [place]/at  CV: RRR no murmurs  Lungs: ctab  Abdomen: soft, ntnd, normal bs  Extremities: left thigh HD cath intact, warm lower extremities no cyanosis or edema  Neuro: Follows commands, nonverbal=baseline, alert  Discharge Labs:  Results for RYER, ASATO (MRN [id number]) as of 12/23/2012 16:25  Ref. Range 12/21/2012 22:10 12/21/2012 22:21  Sodium  Latest Range: 135-145 mEq/L 144 142  Potassium Latest Range: 3.5-5.1 mEq/L 3.3 (L) 3.3 (L)  Chloride Latest Range: 96-112 mEq/L 95 (L) 96  CO2 Latest Range: 19-32 mEq/L 29   BUN Latest Range: 6-23 mg/dL 17 18  Creatinine Latest Range: 0.50-1.35 mg/dL 7.82 (H) 9.56 (H)  Calcium Latest Range: 8.4-10.5 mg/dL 9.3   GFR calc non Af Amer Latest Range: >90 mL/min 9 (L)   GFR calc Af Amer Latest Range: >90 mL/min 11 (L)   Glucose Latest Range: 70-99 mg/dL 213 (H) 086 (H)  Calcium Ionized Latest Range: 1.13-1.30 mmol/L  0.99 (L)  Alkaline Phosphatase Latest Range: 39-117 U/L 149 (H)   Albumin Latest Range: 3.5-5.2 g/dL 3.5   AST Latest Range: 0-37 U/L 57 (H)   ALT Latest Range: 0-53 U/L 34   Total Protein Latest Range: 6.0-8.3 g/dL 8.0   Total Bilirubin Latest Range: 0.3-1.2 mg/dL 0.3     Results for TEION, BALLIN (MRN [id number]) as of 12/23/2012 16:25  Ref. Range 12/21/2012 22:57 12/22/2012 05:30 12/22/2012 11:38  Lactic Acid, Venous Latest Range: 0.5-2.2 mmol/L 2.80 (H)  1.9   Results for BLYTHE, HARTSHORN (MRN [id number]) as of 12/23/2012 16:25  Ref. Range 12/22/2012 05:30 12/22/2012 11:38 12/22/2012 16:00 12/23/2012 06:17  Sodium Latest Range: 135-145 mEq/L 143   146 (H)  Potassium Latest Range: 3.5-5.1 mEq/L 3.6   4.5  Chloride Latest Range: 96-112 mEq/L 102   105  CO2 Latest Range: 19-32 mEq/L 27   25  Mean Plasma Glucose Latest Range: <117 mg/dL   010   BUN Latest Range: 6-23 mg/dL 22   28 (H)  Creatinine Latest Range: 0.50-1.35 mg/dL 2.72 (H)   5.36 (H)  Calcium Latest Range: 8.4-10.5 mg/dL 8.8   9.5  GFR calc non Af Amer Latest Range: >90 mL/min 8 (L)   6 (L)  GFR calc Af Amer Latest Range: >90 mL/min 9 (L)   7 (L)  Glucose Latest Range: 70-99 mg/dL 644 (H)   89  Phosphorus Latest Range: 2.3-4.6 mg/dL    5.8 (H)  Magnesium Latest Range: 1.5-2.5 mg/dL    2.1  Alkaline Phosphatase Latest Range: 39-117 U/L 155 (H)   147 (H)  Albumin Latest Range: 3.5-5.2 g/dL 3.2 (L)   3.1 (L)  AST Latest Range: 0-37  U/L 68 (H)   39 (H)  ALT Latest Range: 0-53 U/L 44   34  Total Protein Latest Range: 6.0-8.3 g/dL 7.3  6.8  Total Bilirubin Latest Range: 0.3-1.2 mg/dL 0.2 (L)   0.3  GGT Latest Range: 7-51 U/L    137 (H)   Results for orders placed during the hospital encounter of 12/21/12 (from the past 24 hour(s))  COMPREHENSIVE METABOLIC PANEL     Status: Abnormal   Collection Time    12/23/12  6:17 AM      Result Value Range   Sodium 146 (*) 135 - 145 mEq/L   Potassium 4.5  3.5 - 5.1 mEq/L   Chloride 105  96 - 112 mEq/L   CO2 25  19 - 32 mEq/L   Glucose, Bld 89  70 - 99 mg/dL   BUN 28 (*) 6 - 23 mg/dL   Creatinine, Ser 1.91 (*) 0.50 - 1.35 mg/dL   Calcium 9.5  8.4 - 47.8 mg/dL   Total Protein 6.8  6.0 - 8.3 g/dL   Albumin 3.1 (*) 3.5 - 5.2 g/dL   AST 39 (*) 0 - 37 U/L   ALT 34  0 - 53 U/L   Alkaline Phosphatase 147 (*) 39 - 117 U/L   Total Bilirubin 0.3  0.3 - 1.2 mg/dL   GFR calc non Af Amer 6 (*) >90 mL/min   GFR calc Af Amer 7 (*) >90 mL/min  MAGNESIUM     Status: None   Collection Time    12/23/12  6:17 AM      Result Value Range   Magnesium 2.1  1.5 - 2.5 mg/dL  PHOSPHORUS     Status: Abnormal   Collection Time    12/23/12  6:17 AM      Result Value Range   Phosphorus 5.8 (*) 2.3 - 4.6 mg/dL  GAMMA GT     Status: Abnormal   Collection Time    12/23/12  6:17 AM      Result Value Range   GGT 137 (*) 7 - 51 U/L    Signed: Annett Gula 12/23/2012, 4:27 PM   Time Spent on Discharge: >30 minutes  Services Ordered on Discharge: none Equipment Ordered on Discharge: none

## 2012-12-23 NOTE — Progress Notes (Signed)
Subjective: Patient is still nonverbal  RN: overnight ate dinner. BP trended back up to 98/64.    Objective: Vital signs in last 24 hours: Filed Vitals:   12/22/12 2200 12/23/12 0038 12/23/12 0231 12/23/12 0522  BP: 74/42 71/47 85/57  98/64  Pulse:  84 81 82  Temp: 97.4 F (36.3 C)  97.5 F (36.4 C) 97.7 F (36.5 C)  TempSrc:   Oral Oral  Resp:   20 20  Height:      Weight:      SpO2:   100% 100%   Weight change:  No intake or output data in the 24 hours ending 12/23/12 0821 General: lying in bed nonverbal, nad HEENT: exopthalmos, [place]/at CV: RRR no murmurs  Lungs: ctab Abdomen: soft, ntnd, normal bs  Extremities: left thigh HD cath intact, warm lower extremities no cyanosis or edema  Neuro:  Follows commands, nonverbal=baseline, alert  Lab Results: Basic Metabolic Panel:  Recent Labs Lab 12/22/12 0530 12/23/12 0617  NA 143 146*  K 3.6 4.5  CL 102 105  CO2 27 25  GLUCOSE 137* 89  BUN 22 28*  CREATININE 6.60* 8.77*  CALCIUM 8.8 9.5  MG  --  2.1  PHOS  --  5.8*   Liver Function Tests:  Recent Labs Lab 12/22/12 0530 12/23/12 0617  AST 68* 39*  ALT 44 34  ALKPHOS 155* 147*  BILITOT 0.2* 0.3  PROT 7.3 6.8  ALBUMIN 3.2* 3.1*   CBC:  Recent Labs Lab 12/21/12 2210 12/21/12 2221 12/22/12 0530  WBC 6.0  --  6.7  NEUTROABS 4.0  --   --   HGB 9.5* 9.9* 8.6*  HCT 28.6* 29.0* 25.7*  MCV 93.8  --  94.8  PLT 201  --  191   Misc. Labs: None   Studies/Results: Dg Chest Port 1 View  12/21/2012  *RADIOLOGY REPORT*  Clinical Data: Seizures; chest pain.  PORTABLE CHEST - 1 VIEW  Comparison: Chest radiograph performed 12/14/2012  Findings: The lungs are hypoexpanded but appear grossly clear. There is no evidence of focal opacification, pleural effusion or pneumothorax.  The cardiomediastinal silhouette is within normal limits.  No acute osseous abnormalities are seen.  IMPRESSION: Hypoexpanded but grossly clear lungs.  No displaced rib fractures seen.   Original  Report Authenticated By: Tonia Ghent, M.D.    Medications:  Scheduled Meds: . sodium chloride   Intravenous Once  . aspirin EC  81 mg Oral Daily  . atorvastatin  20 mg Oral QHS  . clonazePAM  0.25 mg Oral BID  . heparin  5,000 Units Subcutaneous Q8H  . midodrine  15 mg Oral TID WC  . multivitamin  1 tablet Oral QHS  . pantoprazole  40 mg Oral Daily  . phenytoin  200 mg Oral Daily  . phenytoin  200 mg Oral Once  . sevelamer carbonate  800 mg Oral TID WC   Continuous Infusions: . sodium chloride 50 mL/hr at 12/22/12 2257   PRN Meds:.acetaminophen, acetaminophen, alum & mag hydroxide-simeth, morphine injection, ondansetron (ZOFRAN) IV, ondansetron Assessment/Plan: 62yo M with ESRD, chronic hypotension, mental retardation, and seizure disorder presents to the ED after a witnessed seizure after HD today and was found to be hypotensive  1. Seizure d/o  -Recently noncompliant with antiepileptic medications -Klonopin 0.25 mg bid, bolused Dilantin then placed on Dilantin suspension 200 mg qd  -will need to follow up outpatient with Neurology  2. Acute on chronic Hypotension -BP 69/39 on admission.  Baseline BP sbp 80s-90s -monitor  BP improved  -Midodrine 15 mg tid  -on IVF 50 cc/hr  3. ESRD on HD  -He had HD Tuesday via cath in left thigh  -Renal aware  4. QT prolongation, resolved  -QTc 508-->470  5. Elevated transaminases  -AP and transaminases trending down -elevated ggt  -pending hepatitis panel negative in 2010.  Dilantin may also cause elevated transaminases or hepatitis   6. F/E/N -NS 50 cc/hr  -will monitor and replace electrolytes  -diet ordered    7. DVT -Heparin 5K tid   Dispo: possibly d/c after HD with outpatient f/u with Neurology, PCP, and HD   The patient does have a current PCP (Lonzo Cloud, FNU, MD), therefore will be requiring OPC follow-up after discharge.   The patient does not have transportation limitations that hinder transportation to clinic  appointments.  .Services Needed at time of discharge: Y = Yes, Blank = No PT:   OT:   RN:   Equipment:   Other:     LOS: 2 days   Annett Gula [phone] 12/23/2012, 8:21 AM

## 2012-12-24 DIAGNOSIS — I959 Hypotension, unspecified: Secondary | ICD-10-CM

## 2012-12-24 LAB — RENAL FUNCTION PANEL
CO2: 29 mEq/L (ref 19–32)
Calcium: 9.3 mg/dL (ref 8.4–10.5)
Creatinine, Ser: 5.11 mg/dL — ABNORMAL HIGH (ref 0.50–1.35)
GFR calc non Af Amer: 11 mL/min — ABNORMAL LOW (ref >90)

## 2012-12-24 NOTE — Evaluation (Signed)
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 No family present, so unable to determine prior level of function. Pt walked 180' with a RW with min/mod assist to manage RW. 24 hour assist recommended upon DC. RW recommended if pt doesn't have one at home.     PT Assessment  Patient needs continued PT services    Follow Up Recommendations  Supervision/Assistance - 24 hour;No PT follow up    Does the patient have the potential to tolerate intense rehabilitation      Barriers to Discharge        Equipment Recommendations  Rolling walker with 5" wheels    Recommendations for Other Services     Frequency Min 3X/week    Precautions / Restrictions Precautions Precautions: None Restrictions Weight Bearing Restrictions: No   Pertinent Vitals/Pain **BP 98/58 supine, 102/60 sitting, 102/62 standing*      Mobility  Bed Mobility Bed Mobility: Supine to Sit Supine to Sit: 2: Max assist Details for Bed Mobility Assistance: pt 40% Transfers Transfers: Sit to Stand;Stand to Sit Sit to Stand: 4: Min assist;From bed Stand to Sit: 4: Min assist;To bed Ambulation/Gait Ambulation/Gait Assistance: 4: Min assist;3: Mod assist Ambulation Distance (Feet): 180 Feet Assistive device: Rolling walker Ambulation/Gait Assistance Details: min to mod assist to manage RW/negotiate obstacles; pt walks at fast pace, manual cues to slow down Gait Pattern: Within Functional Limits;Trunk flexed Gait velocity: increased    Exercises     PT Diagnosis: Abnormality of gait;Altered mental status  PT Problem List: Decreased mobility;Decreased safety awareness;Decreased knowledge of use of DME PT Treatment Interventions: Gait training;DME  instruction;Functional mobility training;Patient/family education   PT Goals Acute Rehab PT Goals PT Goal Formulation: Patient unable to participate in goal setting Time For Goal Achievement: 01/07/13 Potential to Achieve Goals: Fair Pt will go Supine/Side to Sit: with min assist PT Goal: Supine/Side to Sit - Progress: Goal set today Pt will go Sit to Stand: with supervision PT Goal: Sit to Stand - Progress: Goal set today Pt will go Stand to Sit: with supervision PT Goal: Stand to Sit - Progress: Goal set today Pt will Ambulate: >150 feet;with min assist PT Goal: Ambulate - Progress: Goal set today  Visit Information  Last PT Received On: 12/24/12 Assistance Needed: +1    Subjective Data  Subjective: pt non-verbal at baseline, did not respond to questions Patient Stated Goal: none stated, no family present   Prior Functioning  Home Living Lives With: Other (Comment) (unknown, pt mute, no family present) Additional Comments: pt appeared to be familiar with a rolling walker Prior Function Level of Independence:  (unknown, pt mute, no family present)    Cognition  Cognition Overall Cognitive Status: No family/caregiver present to determine baseline cognitive functioning Arousal/Alertness: Awake/alert Behavior During Session: [facility] for tasks performed Cognition - Other Comments: pt has h/o mental retardation and mutism, pt did not respond to questions, unable to follow directions but cooperative    Extremity/Trunk Assessment Right Upper Extremity Assessment RUE ROM/Strength/Tone: [facility] for tasks assessed Left Upper Extremity Assessment LUE ROM/Strength/Tone: [facility] for tasks assessed Right Lower Extremity Assessment RLE ROM/Strength/Tone: [facility] for tasks assessed Left Lower Extremity Assessment LLE ROM/Strength/Tone: [facility] for tasks assessed   Balance Balance Balance Assessed: Yes Static Sitting Balance Static Sitting - Balance  Support: Bilateral upper extremity supported;Feet  supported Static Sitting - Level of Assistance: 5: Stand by assistance Static Sitting - Comment/# of Minutes: 3  End of Session PT - End of Session Equipment Utilized During Treatment: Gait belt Activity Tolerance: Patient tolerated treatment well Patient left: in bed;with bed alarm set Nurse Communication: Mobility status  GP Functional Assessment Tool Used: clinical judgement Functional Limitation: Mobility: Walking and moving around Mobility: Walking and Moving Around Current Status (A2130): At least 20 percent but less than 40 percent impaired, limited or restricted Mobility: Walking and Moving Around Goal Status [phone]): At least 1 percent but less than 20 percent impaired, limited or restricted   Tamala Ser 12/24/2012, 2:16 PM [phone]

## 2012-12-24 NOTE — Progress Notes (Signed)
Patient ID: [name], male   DOB: 08/10/1951, 62 y.o.   MRN: [id number]  [name] Progress Note    Subjective:   Pt is awake/alert, nonverbal but interactive.  Smiled and could communicate with nods and shakes of his head.  Refused meds and breakfast this am   Objective:   BP 83/51  Pulse 99  Temp(Src) 98.2 F (36.8 C) (Oral)  Resp 18  Ht 5' (1.524 m)  Wt 58.7 kg (129 lb 6.6 oz)  BMI 25.27 kg/m2  SpO2 99%  Intake/Output: I/O last 3 completed shifts: In: -  Out: 1592 [Other:1592]   Intake/Output this shift:    Weight change:   Physical Exam: General: Well developed, well nourished, in no acute distress supine in bed.  More awake/alert and interactive this morning and smiled at me but did not want to eat breakfast or take meds. Head: Normocephalic, atraumatic, sclera non-icteric, exothamic  Neck: Supple. JVD not elevated.  Lungs: Clear bilaterally to auscultation without wheezes, rales, or rhonchi. Breathing is unlabored.  Heart: RRR 3/6 loudest at the LLSB  Abdomen: Soft, non-tender, non-distended with + BS frim mobile mass in LLQ,doesn't exactly feel like colon  Extremities: upper - mittens on hands; lower - no edema or ischemic changes, no open wounds  Neuro: Alert Nonverbal - flat (has been more nonverbally animated in the outpatient setting at baseline)  Dialysis Access: right thigh tunneled catheter  Labs: BMET  Recent Labs Lab 12/21/12 2210 12/21/12 2221 12/22/12 0530 12/23/12 0617 12/24/12 0620  NA 144 142 143 146* 144  K 3.3* 3.3* 3.6 4.5 3.3*  CL 95* 96 102 105 105  CO2 29  --  27 25 29   GLUCOSE 195* 175* 137* 89 87  BUN 17 18 22  28* 12  CREATININE 5.76* 5.50* 6.60* 8.77* 5.11*  ALBUMIN 3.5  --  3.2* 3.1* 3.3*  CALCIUM 9.3  --  8.8 9.5 9.3  PHOS  --   --   --  5.8* 3.2   CBC  Recent Labs Lab 12/21/12 2210 12/21/12 2221 12/22/12 0530  WBC 6.0  --  6.7  NEUTROABS 4.0  --   --   HGB 9.5* 9.9* 8.6*  HCT 28.6* 29.0* 25.7*   MCV 93.8  --  94.8  PLT 201  --  191    @IMGRELPRIORS @ Medications:    . sodium chloride   Intravenous Once  . aspirin EC  81 mg Oral Daily  . atorvastatin  20 mg Oral QHS  . clonazePAM  0.25 mg Oral BID  . darbepoetin (ARANESP) injection - DIALYSIS  100 mcg Intravenous Q Thu-HD  . doxercalciferol  6 mcg Intravenous Q T,Th,Sa-HD  . feeding supplement (NEPRO CARB STEADY)  237 mL Oral BID BM  . ferric gluconate (FERRLECIT/NULECIT) IV  125 mg Intravenous Q T,Th,Sa-HD  . heparin  5,000 Units Subcutaneous Q8H  . midodrine  15 mg Oral TID WC  . multivitamin  1 tablet Oral QHS  . pantoprazole  40 mg Oral Daily  . phenytoin  200 mg Oral Daily  . phenytoin  200 mg Oral Once  . sevelamer carbonate  800 mg Oral TID WC     Assessment/ Plan:    Dialysis Orders: Center: [facility] on TTS . Right thigh catheter  EDW 56HD Bath 2K 2 Ca Time 3.75 Heparin 2400 Access: left femoral catheter 400/ A 1.5 Optiflux 16  hectorol 6 Epogen none Venofer none Other profile 4 Hgb 9.2 2/27 Tsat 25% 2/20 with  ferritin 419 in January -  Assessment/Plan:  1. Seizures - secondary pt's refusal to take meds; need to follow free dilantin levels in dialysis patients 2. ESRD - TTS [facility] - recently had several decots of thigh graft( twice in Feb and twice in Jan) which has ultimately failed and now has a catheter; HD tomorrow per routine 3. Chronic Hypotension/volume - BP normally is low however more so recently.  Have ordered 2D ECHO to evaluate EF and valvular lesions to explain worsening hypotension.  May be due to noncompliance with midodrine as well.  Cortisol level was ordered yesterday but not drawn for unclear reasons.  Will reorder this morning.  Ok to use fem catheter for blood draws given his MR and anxiety. midodrine 15 tid; below EDW upon admission; weigh pre HD today - he can do a standing weight 4. Anemia - had been off EPO ; Hgb drop likely due to multiple vascular procedures, but will check FOBT - start Aranesp;   5. Metabolic bone disease - Continue hectorol and binders 6. Nutrition - renal diet + nepro 7. ^ LFT - GGT 137 - other LFTs as above; Review of outpt records show Hep C neg and Hgp b AB > 150 in January - AST and ALT in 20s in January  8. Hx of presumed adrenal insufficiency, previously on hydrocortisone Per outpt problem list; not on med list here; check cortisol today as above.   [name] A 12/24/2012, 9:42 AM

## 2012-12-24 NOTE — Progress Notes (Signed)
Internal Medicine Teaching Service Attending Note Date: 12/24/2012  Patient name: [name]  Medical record number: [id number]  Date of birth: Apr 04, 1951   The patient is non-verbal. Did not interact with me. I am told he refused his medications again. He received his dialysis yesterday and was hypotensive but symptomatic.    12/24/12 1000 12/24/12 1340 12/24/12 1342 12/24/12 1346  BP: 101/61 98/58 102/60 102/62   Exam: Unchanged from yesterday.  Non-verbal, but alert. Well developed, in no acute distress. Heart: systolic murmur+ Lungs: Clear Abdomen: soft, non tender. Right thigh catheter.    Recent Labs Lab 12/21/12 2210 12/21/12 2221 12/22/12 0530 12/23/12 0617 12/24/12 0620  NA 144 142 143 146* 144  K 3.3* 3.3* 3.6 4.5 3.3*  CL 95* 96 102 105 105  CO2 29  --  27 25 29   GLUCOSE 195* 175* 137* 89 87  BUN 17 18 22  28* 12  CREATININE 5.76* 5.50* 6.60* 8.77* 5.11*  CALCIUM 9.3  --  8.8 9.5 9.3  MG  --   --   --  2.1  --   PHOS  --   --   --  5.8* 3.2    Recent Labs Lab 12/21/12 2210 12/21/12 2221 12/22/12 0530  HGB 9.5* 9.9* 8.6*  HCT 28.6* 29.0* 25.7*  WBC 6.0  --  6.7  PLT 201  --  191    1. Seizures - None this admission. Non-compliance related. 2. Hypotension - Possibly non-compliance to midodrine. Keeps systolic pressures in 90s at baseline. ECHO done. Orthostatics today. 3. ESRD on dialysis.  4. Presumed renal insufficiency history.    Aletta Edouard 12/24/2012, 2:48 PM.

## 2012-12-24 NOTE — Progress Notes (Addendum)
Subjective: Lying in bed asleep. Nonverbal.    Objective: Vital signs in last 24 hours: Filed Vitals:   12/24/12 1000 12/24/12 1340 12/24/12 1342 12/24/12 1346  BP: 101/61 98/58 102/60 102/62  Pulse: 94     Temp: 98.1 F (36.7 C)     TempSrc: Oral     Resp: 20     Height:      Weight:      SpO2: 100%      Weight change:   Intake/Output Summary (Last 24 hours) at 12/24/12 1431 Last data filed at 12/23/12 1732  Gross per 24 hour  Intake      0 ml  Output   1592 ml  Net  -1592 ml   General: lying in bed nonverbal, asleep but arousable, nad HEENT: exopthalmos, [place]/at CV: +systolic murmur  Lungs: ctab Abdomen: soft, ntnd, normal bs  Extremities: left thigh HD cath intact, warm lower extremities no cyanosis or edema  Neuro:  nonverbal=baseline, arousable  Lab Results: Basic Metabolic Panel:  Recent Labs Lab 12/23/12 0617 12/24/12 0620  NA 146* 144  K 4.5 3.3*  CL 105 105  CO2 25 29  GLUCOSE 89 87  BUN 28* 12  CREATININE 8.77* 5.11*  CALCIUM 9.5 9.3  MG 2.1  --   PHOS 5.8* 3.2   Liver Function Tests:  Recent Labs Lab 12/22/12 0530 12/23/12 0617 12/24/12 0620  AST 68* 39*  --   ALT 44 34  --   ALKPHOS 155* 147*  --   BILITOT 0.2* 0.3  --   PROT 7.3 6.8  --   ALBUMIN 3.2* 3.1* 3.3*   CBC:  Recent Labs Lab 12/21/12 2210 12/21/12 2221 12/22/12 0530  WBC 6.0  --  6.7  NEUTROABS 4.0  --   --   HGB 9.5* 9.9* 8.6*  HCT 28.6* 29.0* 25.7*  MCV 93.8  --  94.8  PLT 201  --  191   Misc. Labs: None   Studies/Results: No results found. Medications:  Scheduled Meds: . sodium chloride   Intravenous Once  . aspirin EC  81 mg Oral Daily  . atorvastatin  20 mg Oral QHS  . clonazePAM  0.25 mg Oral BID  . darbepoetin (ARANESP) injection - DIALYSIS  100 mcg Intravenous Q Thu-HD  . doxercalciferol  6 mcg Intravenous Q T,Th,Sa-HD  . feeding supplement (NEPRO CARB STEADY)  237 mL Oral BID BM  . ferric gluconate (FERRLECIT/NULECIT) IV  125 mg Intravenous Q  T,Th,Sa-HD  . heparin  5,000 Units Subcutaneous Q8H  . midodrine  15 mg Oral TID WC  . multivitamin  1 tablet Oral QHS  . pantoprazole  40 mg Oral Daily  . phenytoin  200 mg Oral Daily  . phenytoin  200 mg Oral Once  . sevelamer carbonate  800 mg Oral TID WC   Continuous Infusions:   PRN Meds:.acetaminophen, acetaminophen, morphine injection, ondansetron (ZOFRAN) IV, ondansetron Assessment/Plan: 62yo M with ESRD, chronic hypotension, mental retardation, and seizure disorder presents to the ED after a witnessed seizure after HD on day of admission and was found to be hypotensive  1. Seizure d/o  -Recently noncompliant with antiepileptic medications -Klonopin 0.25 mg bid, bolused Dilantin then placed on Dilantin suspension 200 mg qd  -will need to follow up outpatient with Neurology if still actively seizing after d/c  -Dilantin level to follow   2. Acute on chronic Hypotension -BP 69/39 on admission.  Baseline BP sbp 80s-90s -monitor BP -Midodrine 15 mg tid  -  pt was supposed to be discharged 3/6 but after HD nephrologist concerned about hypotension ordered echo which is pending prior to discharge  -negative orthostatics -cortisol level pending -3/7 Spoke with Dr. Arrie Aran.  He stated sbp 70s is tolerable as long as he can ambulate, and the patient is not symptomatic (i.e. Orthostatic, syncope)  3. ESRD on HD TThSat -Renal following   4. Elevated transaminases  -AP and transaminases trending down -elevated ggt  -pending hepatitis panel (negative in 2010).  Dilantin may also cause elevated transaminases or hepatitis  -will need to be worked up outpatient   5. F/E/N  -will monitor and replace electrolytes (hypoK 3.3-renal following) -renal diet ordered   6. DVT -Heparin 5K tid   Dispo: possibly d/c today or 3/8 after echo resulted with outpatient f/u with  PCP, and HD   The patient does have a current PCP (NUTAN, FNU, MD), therefore will be requiring OPC follow-up after  discharge.   The patient does not have transportation limitations that hinder transportation to clinic appointments.  .Services Needed at time of discharge: Y = Yes, Blank = No PT:   OT:   RN:   Equipment:   Other:     LOS: 3 days   Annett Gula [phone] 12/24/2012, 2:31 PM

## 2012-12-24 NOTE — Progress Notes (Signed)
  Echocardiogram 2D Echocardiogram has been performed.  [name] 12/24/2012, 3:35 PM

## 2012-12-25 LAB — RENAL FUNCTION PANEL
Albumin: 3.3 g/dL — ABNORMAL LOW (ref 3.5–5.2)
CO2: 29 mEq/L (ref 19–32)
Chloride: 104 mEq/L (ref 96–112)
Creatinine, Ser: 7.75 mg/dL — ABNORMAL HIGH (ref 0.50–1.35)
GFR calc Af Amer: 8 mL/min — ABNORMAL LOW (ref >90)
GFR calc non Af Amer: 7 mL/min — ABNORMAL LOW (ref >90)
Potassium: 3.9 mEq/L (ref 3.5–5.1)

## 2012-12-25 LAB — CBC
MCV: 97.5 fL (ref 78.0–100.0)
Platelets: 224 10*3/uL (ref 150–400)
RBC: 2.38 MIL/uL — ABNORMAL LOW (ref 4.22–5.81)
RDW: 17.4 % — ABNORMAL HIGH (ref 11.5–15.5)
WBC: 5.2 10*3/uL (ref 4.0–10.5)

## 2012-12-25 NOTE — Progress Notes (Signed)
OT Cancellation Note  Patient Details Name: [name] MRN: [id number] DOB: 24-Mar-1951   Cancelled Treatment:    Reason Eval/Treat Not Completed: Patient at procedure or test/ unavailable (HD). Will re-attempt next date.  12/25/2012 Cipriano Mile OTR/L Pager [phone] Office [phone]

## 2012-12-25 NOTE — Progress Notes (Signed)
Subjective: No acute events overnight; echo performed yesterday with good EF and normal valves.  Taking meds. No seizure activity.  Due for HD today.   Objective: Vital signs in last 24 hours: Filed Vitals:   12/24/12 1447 12/24/12 1746 12/24/12 2233 12/25/12 0610  BP: 102/58 96/54 86/52  90/49  Pulse: 98 92 83 87  Temp: 98.9 F (37.2 C) 98.1 F (36.7 C) 97.9 F (36.6 C) 98 F (36.7 C)  TempSrc: Oral Oral Oral Oral  Resp: 18 16 18    Height:      Weight:      SpO2: 100% 100% 97% 98%   Weight change:   Intake/Output Summary (Last 24 hours) at 12/25/12 0858 Last data filed at 12/25/12 0825  Gross per 24 hour  Intake      0 ml  Output      0 ml  Net      0 ml   General: resting in bed, nods and smiles to questions, follows commands  HEENT: PERRL, EOMI, no scleral icterus Cardiac: RRR, no rubs or gallops; audible flow murmur Pulm: clear to auscultation bilaterally, moving normal volumes of air - but with poor effort Abd: soft, nontender, nondistended, BS normoactive Ext: warm and well perfused, no pedal edema Neuro: alert and oriented X3, cranial nerves II-XII grossly intact   Lab Results: cortisol pending Basic Metabolic Panel:  Recent Labs Lab 12/23/12 0617 12/24/12 0620 12/25/12 0550  NA 146* 144 145  K 4.5 3.3* 3.9  CL 105 105 104  CO2 25 29 29   GLUCOSE 89 87 80  BUN 28* 12 25*  CREATININE 8.77* 5.11* 7.75*  CALCIUM 9.5 9.3 9.4  MG 2.1  --   --   PHOS 5.8* 3.2 3.8   Liver Function Tests:  Recent Labs Lab 12/22/12 0530 12/23/12 0617 12/24/12 0620 12/25/12 0550  AST 68* 39*  --   --   ALT 44 34  --   --   ALKPHOS 155* 147*  --   --   BILITOT 0.2* 0.3  --   --   PROT 7.3 6.8  --   --   ALBUMIN 3.2* 3.1* 3.3* 3.3*   CBC:  Recent Labs Lab 12/21/12 2210  12/22/12 0530 12/25/12 0550  WBC 6.0  --  6.7 5.2  NEUTROABS 4.0  --   --   --   HGB 9.5*  < > 8.6* 7.6*  HCT 28.6*  < > 25.7* 23.2*  MCV 93.8  --  94.8 97.5  PLT 201  --  191 224  < > =  values in this interval not displayed.  Hemoglobin A1C:  Recent Labs Lab 12/22/12 1600  HGBA1C 5.5   Medications: I have reviewed the patient's current medications. Scheduled Meds: . sodium chloride   Intravenous Once  . aspirin EC  81 mg Oral Daily  . atorvastatin  20 mg Oral QHS  . clonazePAM  0.25 mg Oral BID  . darbepoetin (ARANESP) injection - DIALYSIS  100 mcg Intravenous Q Thu-HD  . doxercalciferol  6 mcg Intravenous Q T,Th,Sa-HD  . feeding supplement (NEPRO CARB STEADY)  237 mL Oral BID BM  . ferric gluconate (FERRLECIT/NULECIT) IV  125 mg Intravenous Q T,Th,Sa-HD  . heparin  5,000 Units Subcutaneous Q8H  . midodrine  15 mg Oral TID WC  . multivitamin  1 tablet Oral QHS  . pantoprazole  40 mg Oral Daily  . phenytoin  200 mg Oral Daily  . phenytoin  200 mg Oral  Once  . sevelamer carbonate  800 mg Oral TID WC   Continuous Infusions:  PRN Meds:.acetaminophen, acetaminophen, morphine injection, ondansetron (ZOFRAN) IV, ondansetron Assessment/Plan: 62yo M with ESRD, chronic hypotension, mental retardation, and seizure disorder presents to the ED after a witnessed seizure after HD on day of admission and was found to be hypotensive   1. Seizure d/o  -Recently noncompliant with antiepileptic medications; free dilantin level pending (ESRD patient) -Klonopin 0.25 mg bid, bolused Dilantin then placed on Dilantin suspension 200 mg qd  -will need to follow up outpatient with Neurology if still actively seizing after d/c   2. Acute on chronic Hypotension  -BP 69/39 on admission. Baseline BP sbp 80s-90s  -monitor BP  -Midodrine 15 mg tid  -pt was supposed to be discharged 3/6 but after HD nephrologist concerned about hypotension--> echo with normal EF and valves; cortisol pending -negative orthostatics  -3/7 Spoke with Dr. Arrie Aran. He stated sbp 70s is tolerable as long as he can ambulate, and the patient is not symptomatic (i.e. Orthostatic, syncope)   3. ESRD on HD TThSat   -Renal following --> HD today  4. Elevated transaminases  -AP and transaminases trending down with elevated ggt  -pending hepatitis panel (negative in 2010). Dilantin may also cause elevated transaminases or hepatitis, but levels trending down despite dilantin so less likely; Dr. Arrie Aran reviewed outpt records --> Hep B>150 in January with AST & ALT in 20s in Jan -follow outpt  5. F/E/N  -will monitor and replace electrolytes (hypoK 3.3-renal following)  -renal diet ordered   6. DVT  -Heparin 5K tid    Dispo: Plan to d/c home today after HD  The patient does have a current PCP (NUTAN, FNU, MD), therefore will be requiring OPC follow-up after discharge.   The patient does not have transportation limitations that hinder transportation to clinic appointments.  .Services Needed at time of discharge: Y = Yes, Blank = No PT:   OT:   RN:   Equipment:   Other:     LOS: 4 days   [name], [name] 12/25/2012, 8:58 AM

## 2012-12-25 NOTE — Progress Notes (Signed)
Pt discharge orders received. Pt had hemodialysis today and is stable. To be discharged home via nephew (caregiver).  Pt offered to be transported via [facility], however due to a delay in [facility] being able to get the pt today the family chose to have nephew come get him and take him home to his caregivers house. Pt transported to car via w/c. Allen, [place] Marie, RN

## 2012-12-25 NOTE — Procedures (Signed)
Patient was seen on dialysis and the procedure was supervised. BFR 400 Via left fem PC BP is 92/60.  Patient appears to be tolerating treatment well.  ECHO was normal except for concentric LVH and nothing to explain his ongoing issues with hypotension.  He did have a questionable h/o adrenal insufficiency in the past and cortisol level pending.  dispo- per primary svc.  Cont with HD qTTS while he remains an inpt.  Hopefully he is stable for d/c soon.

## 2012-12-27 LAB — CORTISOL: Cortisol, Plasma: 16.6 ug/dL

## 2012-12-27 NOTE — Discharge Summary (Signed)
Internal Medicine Teaching Service Attending Note Date: 12/27/2012  Patient name: [name]  Medical record number: [id number]  Date of birth: [birth year]    I evaluated the patient on the day of discharge and discussed the discharge plan with my resident team. I agree with Dr. Aline August discharge documentation and disposition. Please refer to her D/C summary for further details.      Thanks Aletta Edouard 12/27/2012, 3:49 PM

## 2012-12-27 NOTE — Care Management Note (Signed)
    Page 1 of 1   12/27/2012     8:42:58 AM   CARE MANAGEMENT NOTE 12/27/2012  Patient:  [name], [name]   Account Number:  [id number]  Date Initiated:  12/22/2012  Documentation initiated by:  [facility]  Subjective/Objective Assessment:   admitted with seizure after hemodialysis, hypotensive,hx seizure disorder, MR, non verbal, lives with family     Action/Plan:   return home   Anticipated DC Date:  12/23/2012   Anticipated DC Plan:  HOME/SELF CARE      DC Planning Services  CM consult      Choice offered to / List presented to:             Status of service:  In process, will continue to follow Medicare Important Message given?   (If response is "NO", the following Medicare IM given date fields will be blank) Date Medicare IM given:   Date Additional Medicare IM given:    Discharge Disposition:    Per UR Regulation:  Reviewed for med. necessity/level of care/duration of stay  If discussed at Long Length of Stay Meetings, dates discussed:    Comments:

## 2012-12-28 LAB — HEPATITIS PANEL, ACUTE
Hep A IgM: NEGATIVE
Hep B C IgM: NEGATIVE

## 2012-12-31 LAB — PHENYTOIN LEVEL, FREE AND TOTAL
Phenytoin Bound: 14.2 mg/L
Phenytoin, Total: 17.7 mg/L (ref 10.0–20.0)

## 2013-01-03 ENCOUNTER — Ambulatory Visit: Payer: Medicare Other | Admitting: Surgery

## 2013-01-04 ENCOUNTER — Ambulatory Visit: Payer: Medicare Other | Admitting: Internal Medicine

## 2013-01-05 ENCOUNTER — Encounter: Payer: Self-pay | Admitting: Internal Medicine

## 2013-01-05 ENCOUNTER — Ambulatory Visit (INDEPENDENT_AMBULATORY_CARE_PROVIDER_SITE_OTHER): Payer: Medicare Other | Admitting: Internal Medicine

## 2013-01-05 VITALS — BP 96/58 | HR 65 | Temp 97.0°F | Ht 62.0 in | Wt 128.0 lb

## 2013-01-05 NOTE — Patient Instructions (Signed)
We will not change your medicines today and will see you back in 3-6 months.  Our number is [phone] and you can call with questions or problems.   You saw Dr. Dorise Hiss today.

## 2013-01-05 NOTE — Progress Notes (Signed)
Subjective:     Patient ID: [name], male   DOB: 1951/02/22, 62 y.o.   MRN: [id number]  HPI The patient is a 62 year old man with past history of mental retardation causing mutism. He does come in with his caretakers at today's visit. He was recently hospitalized for seizure-like activity. He has been on dialysis since 2005 at which time his seizures began. He has been well controlled on Dilantin 200 mg daily. He did stop taking the medication and started having seizures prior to hospitalization. He has been taking the medication in the hospital and since leaving the hospital with no seizures. He is not in any pain and does not have any complaints. The caretakers say that his strength is returning to him and he is able to get around at home without any problem.  Review of Systems  Constitutional: Negative for fever, chills, diaphoresis, activity change, appetite change, fatigue and unexpected weight change.  Respiratory: Negative for cough and shortness of breath.   Cardiovascular: Negative for chest pain.  Gastrointestinal: Negative for abdominal pain.  Skin: Negative for color change, pallor, rash and wound.  Neurological: Negative for dizziness, tremors, seizures, syncope, facial asymmetry, speech difficulty, weakness, light-headedness, numbness and headaches.  Psychiatric/Behavioral: Negative for behavioral problems, confusion, decreased concentration and agitation.       Objective:   Physical Exam  Constitutional: He appears well-developed and well-nourished. No distress.  HENT:  Head: Normocephalic and atraumatic.  Eyes: EOM are normal. Pupils are equal, round, and reactive to light.  Neck: Normal range of motion. Neck supple.  Pulmonary/Chest:  Patient did refuse heart and lung exam.  Abdominal: Soft. Bowel sounds are normal. He exhibits no distension. There is no tenderness. There is no rebound.  Musculoskeletal: He exhibits no edema and no tenderness.  Skin: Skin is warm and  dry. He is not diaphoretic.       Assessment/Plan:   1. Hospital followup-the patient was seen for hospital followup and does not have any repeat seizure activity. We'll continue Dilantin 200 mg daily. His caretakers say that he is doing well and regaining his strength. Do not feel physical for therapy is needed at this time. Will not draw labs today as the patient does decline however feel that a free Dilantin level is indicated at next dialysis session.  2. Disposition-the patient be seen back in 3-6 months. No changes to his medications at today's visit. Did advise them not to initiate aspirin therapy as his risk of bleeding is felt to be higher than his possible benefit from aspirin therapy. Will obtain copy of last lipid panel however he is on statin therapy so assuming moderately normal lipid values his risk of 10 year cardiac event is 10%. Given his significant history of GI bleed his risk of bleeding is likely to be more than possible benefit. We'll continue to reevaluate at next visit.

## 2013-01-07 ENCOUNTER — Encounter: Payer: Self-pay | Admitting: Surgery

## 2013-01-10 ENCOUNTER — Ambulatory Visit (INDEPENDENT_AMBULATORY_CARE_PROVIDER_SITE_OTHER): Payer: Medicare Other | Admitting: Surgery

## 2013-01-10 ENCOUNTER — Other Ambulatory Visit: Payer: Self-pay | Admitting: *Deleted

## 2013-01-10 ENCOUNTER — Encounter (HOSPITAL_COMMUNITY): Payer: Self-pay | Admitting: Pharmacy Technician

## 2013-01-10 ENCOUNTER — Encounter: Payer: Self-pay | Admitting: *Deleted

## 2013-01-10 ENCOUNTER — Encounter (INDEPENDENT_AMBULATORY_CARE_PROVIDER_SITE_OTHER): Payer: Medicare Other | Admitting: *Deleted

## 2013-01-10 ENCOUNTER — Encounter: Payer: Self-pay | Admitting: Surgery

## 2013-01-10 VITALS — BP 95/50 | HR 82 | Ht 62.0 in | Wt 126.5 lb

## 2013-01-10 DIAGNOSIS — Z01810 Encounter for preprocedural cardiovascular examination: Secondary | ICD-10-CM

## 2013-01-10 DIAGNOSIS — N184 Chronic kidney disease, stage 4 (severe): Secondary | ICD-10-CM

## 2013-01-10 DIAGNOSIS — N186 End stage renal disease: Secondary | ICD-10-CM

## 2013-01-10 NOTE — Progress Notes (Signed)
[facility] [place]   Patient name: [name] MRN: [id number] DOB: 05/05/1951 Sex: male     Chief Complaint  Patient presents with  . Routine Post Op    3 wk f/u catheter insertion 12/14/2012    HISTORY OF PRESENT ILLNESS: The patient comes in for dialysis access management. 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 He recently had multiple attempts at declotting a right thigh graft. He ultimately had to have a left-sided catheter placed. His back today to discuss further options. A  Past Medical History  Diagnosis Date  . Upper respiratory infection 12/12/2009  . Heart murmur, systolic 08/10/2009  . 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 Hypotension 05/26/2009  . Syncope 05/07/2009  . 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 Esophageal varices 11/07/2008  . Redness or discharge of eye 11/06/2008  . Skin tag 08/25/2008  . Gastric ulcer 10/09    antral, with h pylori positive  . Abdominal pain, acute, generalized 06/28/2008  . Congestive heart failure 03/06/2008  . Cellulitis and abscess of leg, except foot 03/06/2008  . Sinus tachycardia 03/06/2008  . Edema 03/06/2008  . Impacted cerumen of both ears 03/06/2008  . Secondary hyperparathyroidism 02/02/2008  . Mute 02/02/2008  . Mental retardation 02/02/2008  . Hypertension 02/02/2008  . Hyperlipidemia 02/02/2008  . Anemia 02/02/2008  . ESRD (end stage renal disease)     TTS hemodialysis  . Seizures   . GERD (gastroesophageal reflux disease)     Past Surgical History  Procedure Laterality Date  . Left forearm graft      for HD  . Arteriovenous graft placement  11/22/10    Right thigh AVG  . Thrombectomy and revision of arterioventous (av) goretex  graft    . Thrombectomy and revision of arterioventous (av) goretex  graft  10/22/2012    Procedure: THROMBECTOMY AND REVISION OF ARTERIOVENTOUS (AV) GORETEX  GRAFT;  Surgeon: Todd F Early, MD;  Location: [facility] OR;  Service: Vascular;  Laterality: Right;   . Thrombectomy w/ embolectomy  11/10/2012    Procedure: THROMBECTOMY ARTERIOVENOUS GORE-TEX GRAFT;  Surgeon: James D Lawson, MD;  Location: [facility] OR;  Service: Vascular;  Laterality: Right;  . Thrombectomy and revision of arterioventous (av) goretex  graft Right 12/08/2012    Procedure: THROMBECTOMY AND REVISION OF ARTERIOVENTOUS (AV) GORETEX  GRAFT right thigh;  Surgeon: Charles E Fields, MD;  Location: [facility] OR;  Service: Vascular;  Laterality: Right;  . Venogram N/A 12/08/2012    Procedure: VENOGRAM;  Surgeon: Charles E Fields, MD;  Location: [facility] OR;  Service: Vascular;  Laterality: N/A;  Intraoperative Central venogram  . Thrombectomy w/ embolectomy Right 12/12/2012    Procedure: THROMBECTOMY ARTERIOVENOUS GORE-TEX GRAFT;  Surgeon: Vance W Brabham, MD;  Location: [facility] OR;  Service: Vascular;  Laterality: Right;  . Insertion of dialysis catheter Left 12/14/2012    Procedure: INSERTION OF DIALYSIS CATHETER;  Surgeon: Vance W Brabham, MD;  Location: [facility] OR;  Service: Vascular;  Laterality: Left;    History   Social History  . Marital Status: Single    Spouse Name: N/A    Number of Children: N/A  . Years of Education: N/A   Occupational History  . Not on file.   Social History Main Topics  . Smoking status: Never Smoker   . Smokeless tobacco: Not on file  . Alcohol Use: No  . Drug Use: No  . Sexually Active: No   Other Topics Concern  . Not on file     Social History Narrative  . No narrative on file    History reviewed. No pertinent family history.  Allergies as of 01/10/2013  . (No Known Allergies)    Current Outpatient Prescriptions on File Prior to Visit  Medication Sig Dispense Refill  . atorvastatin (LIPITOR) 20 MG tablet Take 20 mg by mouth at bedtime.       . clonazePAM (KLONOPIN) 0.5 MG tablet Take 0.5 tablets (0.25 mg total) by mouth 2 (two) times daily.  30 tablet  3  . folic acid-vitamin b complex-vitamin c-selenium-zinc (DIALYVITE) 3 MG TABS Take 1 tablet by mouth daily.       . midodrine (PROAMATINE) 10 MG tablet Take 15 mg by mouth 3 (three) times daily.       . pantoprazole (PROTONIX) 40 MG tablet Take 1 tablet (40 mg total) by mouth daily. Please note change to once a day.  90 tablet  3  . phenytoin (DILANTIN) 100 MG ER capsule Take 200 mg by mouth at bedtime.      . sevelamer carbonate (RENVELA) 800 MG tablet Take 800 mg by mouth 3 (three) times daily with meals.       No current facility-administered medications on file prior to visit.     REVIEW OF SYSTEMS: Due to the patient's mental disabilities, these questions cannot be answered PHYSICAL EXAMINATION:   Vital signs are BP 95/50  Pulse 82  Ht 5' 2" (1.575 m)  Wt 126 lb 8 oz (57.38 kg)  BMI 23.13 kg/m2  SpO2 100% General: The patient appears their stated age. HEENT:  No gross abnormalities Pulmonary:  Non labored breathing Abdomen: Soft and non-tender Musculoskeletal: There are no major deformities. Neurologic: No focal weakness or paresthesias are detected, Skin: The right femoral incision is completely healed Psychiatric: The patient has normal affect. Cardiovascular: There is a regular rate and rhythm without significant murmur appreciated.   Diagnostic Studies A formal ultrasound of the right femoral vein was obtained. This is widely patent and compressible  Assessment: End-stage renal disease Plan: I plan on placing a right femoral catheter and removed the left femoral catheter this Thursday. Approximately one week later I will proceed with a left thigh graft.  V. Wells Brabham IV, M.D. [facility] [place] Office: [phone] Pager:  [phone]   

## 2013-01-12 ENCOUNTER — Encounter (HOSPITAL_COMMUNITY): Payer: Self-pay

## 2013-01-12 ENCOUNTER — Encounter (HOSPITAL_COMMUNITY)
Admission: RE | Admit: 2013-01-12 | Discharge: 2013-01-12 | Disposition: A | Discharge status: Home / Self Care | Payer: Medicare Other | Source: Ambulatory Visit | Attending: Surgery | Admitting: Surgery

## 2013-01-12 LAB — SURGICAL PCR SCREEN
MRSA, PCR: NEGATIVE
Staphylococcus aureus: NEGATIVE

## 2013-01-12 MED ORDER — DEXTROSE 5 % IV SOLN
1.5000 g | INTRAVENOUS | Status: AC
  Administered 2013-01-13: 1.5 g via INTRAVENOUS
  Filled 2013-01-12: qty 1.5

## 2013-01-12 NOTE — Pre-Procedure Instructions (Signed)
[name]  01/12/2013   Your procedure is scheduled on:  Thursday, March 27  Report to [facility] at 0630 AM.  Call this number if you have problems the morning of surgery: [phone]   Remember:   Do not eat food or drink liquids after midnight.tonight   Take these medicines the morning of surgery with A SIP OF WATER:Clonazepam, Renvela, pantoprazole,midodrine,            Do not wear jewelry, make-up or nail polish.  Do not wear lotions, powders,   Perfumes or deodorant.  Do not bring valuables to the hospital.  Contacts, dentures or bridgework may not be worn into surgery.      Patients discharged the day of surgery will not be allowed to drive  home.  Name and phone number of your driver:    Special Instructions: Shower using CHG 2 nights before surgery and the night before surgery.  If you shower the day of surgery use CHG.  Use special wash - you have one bottle of CHG for all showers.  You should use approximately 1/3 of the bottle for each shower.   Please read over the following fact sheets that you were given: Pain Booklet, Coughing and Deep Breathing and Surgical Site Infection Prevention

## 2013-01-13 ENCOUNTER — Encounter (HOSPITAL_COMMUNITY): Payer: Self-pay | Admitting: *Deleted

## 2013-01-13 ENCOUNTER — Ambulatory Visit (HOSPITAL_COMMUNITY): Payer: Medicare Other

## 2013-01-13 ENCOUNTER — Ambulatory Visit (HOSPITAL_COMMUNITY): Payer: Medicare Other | Admitting: Anesthesiology

## 2013-01-13 ENCOUNTER — Encounter (HOSPITAL_COMMUNITY): Payer: Self-pay | Admitting: Anesthesiology

## 2013-01-13 ENCOUNTER — Encounter (HOSPITAL_COMMUNITY)
Admission: RE | Disposition: A | Discharge status: Home / Self Care | Payer: Self-pay | Source: Ambulatory Visit | Attending: Surgery

## 2013-01-13 ENCOUNTER — Ambulatory Visit (HOSPITAL_COMMUNITY)
Admission: RE | Admit: 2013-01-13 | Discharge: 2013-01-13 | Disposition: A | Discharge status: Home / Self Care | Payer: Medicare Other | Source: Ambulatory Visit | Attending: Surgery | Admitting: Surgery

## 2013-01-13 DIAGNOSIS — Z8711 Personal history of peptic ulcer disease: Secondary | ICD-10-CM | POA: Insufficient documentation

## 2013-01-13 DIAGNOSIS — Z452 Encounter for adjustment and management of vascular access device: Secondary | ICD-10-CM | POA: Insufficient documentation

## 2013-01-13 DIAGNOSIS — I120 Hypertensive chronic kidney disease with stage 5 chronic kidney disease or end stage renal disease: Secondary | ICD-10-CM | POA: Insufficient documentation

## 2013-01-13 DIAGNOSIS — N186 End stage renal disease: Secondary | ICD-10-CM | POA: Insufficient documentation

## 2013-01-13 DIAGNOSIS — E785 Hyperlipidemia, unspecified: Secondary | ICD-10-CM | POA: Insufficient documentation

## 2013-01-13 DIAGNOSIS — F79 Unspecified intellectual disabilities: Secondary | ICD-10-CM | POA: Insufficient documentation

## 2013-01-13 DIAGNOSIS — R4701 Aphasia: Secondary | ICD-10-CM | POA: Insufficient documentation

## 2013-01-13 DIAGNOSIS — N2581 Secondary hyperparathyroidism of renal origin: Secondary | ICD-10-CM | POA: Insufficient documentation

## 2013-01-13 DIAGNOSIS — R569 Unspecified convulsions: Secondary | ICD-10-CM | POA: Insufficient documentation

## 2013-01-13 DIAGNOSIS — I509 Heart failure, unspecified: Secondary | ICD-10-CM | POA: Insufficient documentation

## 2013-01-13 DIAGNOSIS — K219 Gastro-esophageal reflux disease without esophagitis: Secondary | ICD-10-CM | POA: Insufficient documentation

## 2013-01-13 HISTORY — PX: REMOVAL OF A DIALYSIS CATHETER: SHX6053

## 2013-01-13 HISTORY — PX: INSERTION OF DIALYSIS CATHETER: SHX1324

## 2013-01-13 SURGERY — INSERTION OF DIALYSIS CATHETER
Anesthesia: General | Site: Leg Upper | Laterality: Right | Wound class: Clean

## 2013-01-13 MED ORDER — HEPARIN SODIUM (PORCINE) 1000 UNIT/ML IJ SOLN
INTRAMUSCULAR | Status: DC | PRN
  Administered 2013-01-13: 7 mL

## 2013-01-13 MED ORDER — HYDROMORPHONE HCL PF 1 MG/ML IJ SOLN: 0.2500 mg | INTRAMUSCULAR | Status: DC | PRN

## 2013-01-13 MED ORDER — EPHEDRINE SULFATE 50 MG/ML IJ SOLN
INTRAMUSCULAR | Status: DC | PRN
  Administered 2013-01-13 (×3): 10 mg via INTRAVENOUS

## 2013-01-13 MED ORDER — SODIUM CHLORIDE 0.9 % IR SOLN
Status: DC | PRN
  Administered 2013-01-13: 1000 mL

## 2013-01-13 MED ORDER — HEPARIN SODIUM (PORCINE) 1000 UNIT/ML IJ SOLN
INTRAMUSCULAR | Status: AC
  Filled 2013-01-13: qty 1

## 2013-01-13 MED ORDER — PROPOFOL 10 MG/ML IV BOLUS
INTRAVENOUS | Status: DC | PRN
  Administered 2013-01-13: 90 mg via INTRAVENOUS

## 2013-01-13 MED ORDER — SODIUM CHLORIDE 0.9 % IV SOLN
INTRAVENOUS | Status: DC
  Administered 2013-01-13: 10:00:00 via INTRAVENOUS

## 2013-01-13 MED ORDER — ONDANSETRON HCL 4 MG/2ML IJ SOLN: 4.0000 mg | Freq: Once | INTRAMUSCULAR | Status: DC | PRN

## 2013-01-13 MED ORDER — SODIUM CHLORIDE 0.9 % IR SOLN
Status: DC | PRN
  Administered 2013-01-13: 10:00:00

## 2013-01-13 MED ORDER — SODIUM CHLORIDE 0.9 % IV SOLN
10.0000 mg | INTRAVENOUS | Status: DC | PRN
  Administered 2013-01-13: 80 ug/min via INTRAVENOUS

## 2013-01-13 MED ORDER — PHENYLEPHRINE HCL 10 MG/ML IJ SOLN
INTRAMUSCULAR | Status: DC | PRN
  Administered 2013-01-13 (×10): 40 ug via INTRAVENOUS

## 2013-01-13 MED ORDER — DEXTROSE 5 % IV SOLN
INTRAVENOUS | Status: DC | PRN
  Administered 2013-01-13: 10:00:00 via INTRAVENOUS

## 2013-01-13 SURGICAL SUPPLY — 50 items
ADH SKN CLS APL DERMABOND .7 (GAUZE/BANDAGES/DRESSINGS) ×2
BAG DECANTER FOR FLEXI CONT (MISCELLANEOUS) ×3 IMPLANT
CATH CANNON HEMO 15F 50CM (CATHETERS) ×1 IMPLANT
CATH CANNON HEMO 15FR 19 (HEMODIALYSIS SUPPLIES) IMPLANT
CATH CANNON HEMO 15FR 23CM (HEMODIALYSIS SUPPLIES) IMPLANT
CATH CANNON HEMO 15FR 31CM (HEMODIALYSIS SUPPLIES) IMPLANT
CATH CANNON HEMO 15FR 32 (HEMODIALYSIS SUPPLIES) IMPLANT
CATH CANNON HEMO 15FR 32CM (HEMODIALYSIS SUPPLIES) IMPLANT
CLOTH BEACON ORANGE TIMEOUT ST (SAFETY) ×3 IMPLANT
COVER PROBE W GEL 5X96 (DRAPES) ×3 IMPLANT
COVER SURGICAL LIGHT HANDLE (MISCELLANEOUS) ×3 IMPLANT
DERMABOND ADVANCED (GAUZE/BANDAGES/DRESSINGS) ×1
DERMABOND ADVANCED .7 DNX12 (GAUZE/BANDAGES/DRESSINGS) ×2 IMPLANT
DRAPE C-ARM 42X72 X-RAY (DRAPES) ×3 IMPLANT
DRAPE CHEST BREAST 15X10 FENES (DRAPES) ×3 IMPLANT
DRSG TEGADERM 4X4.75 (GAUZE/BANDAGES/DRESSINGS) ×1 IMPLANT
GAUZE SPONGE 2X2 8PLY STRL LF (GAUZE/BANDAGES/DRESSINGS) IMPLANT
GAUZE SPONGE 4X4 16PLY XRAY LF (GAUZE/BANDAGES/DRESSINGS) ×3 IMPLANT
GLOVE BIOGEL PI IND STRL 6.5 (GLOVE) IMPLANT
GLOVE BIOGEL PI IND STRL 7.5 (GLOVE) ×2 IMPLANT
GLOVE BIOGEL PI INDICATOR 6.5 (GLOVE) ×1
GLOVE BIOGEL PI INDICATOR 7.5 (GLOVE) ×2
GLOVE SURG SS PI 7.5 STRL IVOR (GLOVE) ×4 IMPLANT
GOWN PREVENTION PLUS XLARGE (GOWN DISPOSABLE) ×1 IMPLANT
GOWN PREVENTION PLUS XXLARGE (GOWN DISPOSABLE) ×3 IMPLANT
GOWN STRL NON-REIN LRG LVL3 (GOWN DISPOSABLE) ×2 IMPLANT
KIT BASIN OR (CUSTOM PROCEDURE TRAY) ×3 IMPLANT
KIT ROOM TURNOVER OR (KITS) ×3 IMPLANT
NDL 18GX1X1/2 (RX/OR ONLY) (NEEDLE) ×2 IMPLANT
NDL HYPO 25GX1X1/2 BEV (NEEDLE) ×2 IMPLANT
NEEDLE 18GX1X1/2 (RX/OR ONLY) (NEEDLE) ×3 IMPLANT
NEEDLE HYPO 25GX1X1/2 BEV (NEEDLE) ×3 IMPLANT
NS IRRIG 1000ML POUR BTL (IV SOLUTION) ×3 IMPLANT
PACK SURGICAL SETUP 50X90 (CUSTOM PROCEDURE TRAY) ×3 IMPLANT
PAD ARMBOARD 7.5X6 YLW CONV (MISCELLANEOUS) ×6 IMPLANT
SOAP 2 % CHG 4 OZ (WOUND CARE) ×3 IMPLANT
SPONGE GAUZE 2X2 STER 10/PKG (GAUZE/BANDAGES/DRESSINGS) ×2
SPONGE GAUZE 4X4 12PLY (GAUZE/BANDAGES/DRESSINGS) ×1 IMPLANT
SUT ETHILON 3 0 PS 1 (SUTURE) ×3 IMPLANT
SUT VICRYL 4-0 PS2 18IN ABS (SUTURE) ×3 IMPLANT
SYR 20CC LL (SYRINGE) ×6 IMPLANT
SYR 30ML LL (SYRINGE) IMPLANT
SYR 5ML LL (SYRINGE) ×3 IMPLANT
SYR CONTROL 10ML LL (SYRINGE) ×3 IMPLANT
SYRINGE 10CC LL (SYRINGE) ×4 IMPLANT
TAPE CLOTH 4X10 WHT NS (GAUZE/BANDAGES/DRESSINGS) ×1 IMPLANT
TAPE CLOTH SURG 4X10 WHT LF (GAUZE/BANDAGES/DRESSINGS) ×1 IMPLANT
TOWEL OR 17X24 6PK STRL BLUE (TOWEL DISPOSABLE) ×3 IMPLANT
TOWEL OR 17X26 10 PK STRL BLUE (TOWEL DISPOSABLE) ×3 IMPLANT
WATER STERILE IRR 1000ML POUR (IV SOLUTION) ×3 IMPLANT

## 2013-01-13 NOTE — Transfer of Care (Signed)
Immediate Anesthesia Transfer of Care Note  Patient: [name]  Procedure(s) Performed: Procedure(s): INSERTION OF DIALYSIS CATHETER (Right) REMOVAL OF A DIALYSIS CATHETER (Left)  Patient Location: PACU  Anesthesia Type:General  Level of Consciousness: awake, alert  and patient cooperative  Airway & Oxygen Therapy: Patient Spontanous Breathing and Patient connected to nasal cannula oxygen  Post-op Assessment: Report given to PACU RN and Post -op Vital signs reviewed and stable  Post vital signs: Reviewed and stable  Complications: No apparent anesthesia complications

## 2013-01-13 NOTE — Anesthesia Preprocedure Evaluation (Addendum)
Anesthesia Evaluation  Patient identified by MRN, date of birth, ID band Patient awake    Reviewed: Allergy & Precautions, H&P , NPO status , Patient's Chart, lab work & pertinent test results, reviewed documented beta blocker date and time   Airway Mallampati: I TM Distance: >3 FB Neck ROM: full    Dental  (+) Teeth Intact   Pulmonary          Cardiovascular hypertension, Pt. on medications + Peripheral Vascular Disease and +CHF + Valvular Problems/Murmurs Rhythm:regular Rate:Normal     Neuro/Psych Seizures -,  PSYCHIATRIC DISORDERS    GI/Hepatic PUD, GERD-  Controlled and Medicated,  Endo/Other    Renal/GU ESRF and DialysisRenal diseaseLast cycle Tuesday at [facility].     Musculoskeletal   Abdominal   Peds  Hematology   Anesthesia Other Findings Dental Advisory to caretaker, Mrs Carroll Sage  Reproductive/Obstetrics                         Anesthesia Physical Anesthesia Plan  ASA: III  Anesthesia Plan: General   Post-op Pain Management:    Induction: Intravenous  Airway Management Planned: LMA  Additional Equipment:   Intra-op Plan:   Post-operative Plan: Extubation in OR  Informed Consent: I have reviewed the patients History and Physical, chart, labs and discussed the procedure including the risks, benefits and alternatives for the proposed anesthesia with the patient or authorized representative who has indicated his/her understanding and acceptance.     Plan Discussed with: CRNA, Anesthesiologist and Surgeon  Anesthesia Plan Comments:         Anesthesia Quick Evaluation

## 2013-01-13 NOTE — Interval H&P Note (Signed)
History and Physical Interval Note:  01/13/2013 9:18 AM  [name]  has presented today for surgery, with the diagnosis of ESRD  The various methods of treatment have been discussed with the patient and family. After consideration of risks, benefits and other options for treatment, the patient has consented to  Procedure(s): INSERTION OF DIALYSIS CATHETER (Right) REMOVAL OF A DIALYSIS CATHETER (Left) as a surgical intervention .  The patient's history has been reviewed, patient examined, no change in status, stable for surgery.  I have reviewed the patient's chart and labs.  Questions were answered to the patient's satisfaction.     BRABHAM IV, V. WELLS

## 2013-01-13 NOTE — Anesthesia Procedure Notes (Signed)
Procedure Name: LMA Insertion Date/Time: 01/13/2013 9:54 AM Performed by: Marni Griffon Pre-anesthesia Checklist: Patient identified, Emergency Drugs available, Suction available and Patient being monitored Patient Re-evaluated:Patient Re-evaluated prior to inductionOxygen Delivery Method: Circle system utilized Preoxygenation: Pre-oxygenation with 100% oxygen Intubation Type: IV induction Ventilation: Mask ventilation without difficulty LMA: LMA inserted LMA Size: 4.0 Number of attempts: 1 Placement Confirmation: positive ETCO2 and breath sounds checked- equal and bilateral Tube secured with: taped to cheeks. Dental Injury: Teeth and Oropharynx as per pre-operative assessment

## 2013-01-13 NOTE — Op Note (Signed)
[facility] [place]  Patient name: [name] MRN: [id number] DOB: [birth year] Sex: male  01/13/2013 Pre-operative Diagnosis: ESRD Post-operative diagnosis:  Same Surgeon:  Jorge Ny Assistants:  none Procedure:  #1 U/s guided placementof right thigh Diatek   #2  Removal of left thigh Diatek Anesthesia:  Gen Blood Loss:  See anesthesia record Specimens:  none  Findings:  Good positioning  Indications:  Previous failed right thigh AVGG.  Plan for Diatek in right thigh for later left thigh AVGG  Procedure:  The patient was identified in the holding area and taken to [facility] OR ROOM 11  The patient was then placed supine on the table. general anesthesia was administered.  The patient was prepped and draped in the usual sterile fashion.  A time out was called and antibiotics were administered.  Ulstasound was used to evaluate the right femoral vein which was patent and compressible.  Under ultrasound guidance, the right femoral vein was canulated with an 18G needle.  A -35 wire was advanced into the IVC under flouroscopy.  Sequential dilators were used followed by a peel a way sheath.  Over the wire, a 55cm Diatek catheter was placed into the right atrium.  The sheath was removed.  A skin exit site was selected.  A subcutaneous tunnel was created and dilated.  The catheter was pulled through the skin with the cuff at the skin exit site.  The ports were connected and flushed without difficulty.  Flouroscopy confirmed no kinks in the catheter and that it was in good position.  It was sewn into place with a 3-0 nylon.  The skin access site was closed with a 4-0 vicryl and dermabond.  The catheter was filled with the appropriate volumes of heparin.  Next, the left femoral Diatek was removed.  Using sharp dissection, the cuff was exposed and pulled free from its attachments.  The catheter was withdrawn in its entirety.  Flouroscopy was ultilized again to confirm that the  right sided catheter remained in good position.  There were no complications   Disposition:  To PACU in stable condition.   Juleen China, M.D. [facility] [place] Office: [phone] Pager:  [phone]

## 2013-01-13 NOTE — Progress Notes (Signed)
Notified Dr. Ladene Artist of patient having mental retardation and unable to draw istat or place bracelet.  Blood to be drawn in or hold+ bracelet placed.

## 2013-01-13 NOTE — Anesthesia Postprocedure Evaluation (Signed)
  Anesthesia Post-op Note  Patient: [name]  Procedure(s) Performed: Procedure(s): INSERTION OF DIALYSIS CATHETER (Right) REMOVAL OF A DIALYSIS CATHETER (Left)  Patient Location: PACU  Anesthesia Type:General  Level of Consciousness: awake and patient cooperative  Airway and Oxygen Therapy: Patient Spontanous Breathing  Post-op Pain: none  Post-op Assessment: Post-op Vital signs reviewed, Patient's Cardiovascular Status Stable, Respiratory Function Stable, Patent Airway, No signs of Nausea or vomiting and Pain level controlled  Post-op Vital Signs: stable  Complications: No apparent anesthesia complications

## 2013-01-13 NOTE — H&P (View-Only) (Signed)
[facility] [place]   Patient name: [name] MRN: [id number] DOB: 1951/09/14 Sex: male     Chief Complaint  Patient presents with  . Routine Post Op    3 wk f/u catheter insertion 12/14/2012    HISTORY OF PRESENT ILLNESS: The patient comes in for dialysis access management. 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 He recently had multiple attempts at declotting a right thigh graft. He ultimately had to have a left-sided catheter placed. His back today to discuss further options. A  Past Medical History  Diagnosis Date  . Upper respiratory infection 12/12/2009  . Heart murmur, systolic 08/10/2009  . 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 Hypotension 05/26/2009  . Syncope 05/07/2009  . 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 Esophageal varices 11/07/2008  . Redness or discharge of eye 11/06/2008  . Skin tag 08/25/2008  . Gastric ulcer 10/09    antral, with h pylori positive  . Abdominal pain, acute, generalized 06/28/2008  . Congestive heart failure 03/06/2008  . Cellulitis and abscess of leg, except foot 03/06/2008  . Sinus tachycardia 03/06/2008  . Edema 03/06/2008  . Impacted cerumen of both ears 03/06/2008  . Secondary hyperparathyroidism 02/02/2008  . Mute 02/02/2008  . Mental retardation 02/02/2008  . Hypertension 02/02/2008  . Hyperlipidemia 02/02/2008  . Anemia 02/02/2008  . ESRD (end stage renal disease)     TTS hemodialysis  . Seizures   . GERD (gastroesophageal reflux disease)     Past Surgical History  Procedure Laterality Date  . Left forearm graft      for HD  . Arteriovenous graft placement  11/22/10    Right thigh AVG  . Thrombectomy and revision of arterioventous (av) goretex  graft    . Thrombectomy and revision of arterioventous (av) goretex  graft  10/22/2012    Procedure: THROMBECTOMY AND REVISION OF ARTERIOVENTOUS (AV) GORETEX  GRAFT;  Surgeon: Larina Earthly, MD;  Location: [facility] OR;  Service: Vascular;  Laterality: Right;   . Thrombectomy w/ embolectomy  11/10/2012    Procedure: THROMBECTOMY ARTERIOVENOUS GORE-TEX GRAFT;  Surgeon: Pryor Ochoa, MD;  Location: [facility] OR;  Service: Vascular;  Laterality: Right;  . Thrombectomy and revision of arterioventous (av) goretex  graft Right 12/08/2012    Procedure: THROMBECTOMY AND REVISION OF ARTERIOVENTOUS (AV) GORETEX  GRAFT right thigh;  Surgeon: Sherren Kerns, MD;  Location: [facility] OR;  Service: Vascular;  Laterality: Right;  Susie Cassette N/A 12/08/2012    Procedure: VENOGRAM;  Surgeon: Sherren Kerns, MD;  Location: [facility] OR;  Service: Vascular;  Laterality: N/A;  Intraoperative Central venogram  . Thrombectomy w/ embolectomy Right 12/12/2012    Procedure: THROMBECTOMY ARTERIOVENOUS GORE-TEX GRAFT;  Surgeon: Nada Libman, MD;  Location: [facility] OR;  Service: Vascular;  Laterality: Right;  . Insertion of dialysis catheter Left 12/14/2012    Procedure: INSERTION OF DIALYSIS CATHETER;  Surgeon: Nada Libman, MD;  Location: [facility] OR;  Service: Vascular;  Laterality: Left;    History   Social History  . Marital Status: Single    Spouse Name: N/A    Number of Children: N/A  . Years of Education: N/A   Occupational History  . Not on file.   Social History Main Topics  . Smoking status: Never Smoker   . Smokeless tobacco: Not on file  . Alcohol Use: No  . Drug Use: No  . Sexually Active: No   Other Topics Concern  . Not on file  Social History Narrative  . No narrative on file    History reviewed. No pertinent family history.  Allergies as of 01/10/2013  . (No Known Allergies)    Current Outpatient Prescriptions on File Prior to Visit  Medication Sig Dispense Refill  . atorvastatin (LIPITOR) 20 MG tablet Take 20 mg by mouth at bedtime.       . clonazePAM (KLONOPIN) 0.5 MG tablet Take 0.5 tablets (0.25 mg total) by mouth 2 (two) times daily.  30 tablet  3  . folic acid-vitamin b complex-vitamin c-selenium-zinc (DIALYVITE) 3 MG TABS Take 1 tablet by mouth daily.       . midodrine (PROAMATINE) 10 MG tablet Take 15 mg by mouth 3 (three) times daily.       . pantoprazole (PROTONIX) 40 MG tablet Take 1 tablet (40 mg total) by mouth daily. Please note change to once a day.  90 tablet  3  . phenytoin (DILANTIN) 100 MG ER capsule Take 200 mg by mouth at bedtime.      . sevelamer carbonate (RENVELA) 800 MG tablet Take 800 mg by mouth 3 (three) times daily with meals.       No current facility-administered medications on file prior to visit.     REVIEW OF SYSTEMS: Due to the patient's mental disabilities, these questions cannot be answered PHYSICAL EXAMINATION:   Vital signs are BP 95/50  Pulse 82  Ht 5\' 2"  (1.575 m)  Wt 126 lb 8 oz (57.38 kg)  BMI 23.13 kg/m2  SpO2 100% General: The patient appears their stated age. HEENT:  No gross abnormalities Pulmonary:  Non labored breathing Abdomen: Soft and non-tender Musculoskeletal: There are no major deformities. Neurologic: No focal weakness or paresthesias are detected, Skin: The right femoral incision is completely healed Psychiatric: The patient has normal affect. Cardiovascular: There is a regular rate and rhythm without significant murmur appreciated.   Diagnostic Studies A formal ultrasound of the right femoral vein was obtained. This is widely patent and compressible  Assessment: End-stage renal disease Plan: I plan on placing a right femoral catheter and removed the left femoral catheter this Thursday. Approximately one week later I will proceed with a left thigh graft.  Jorge Ny, M.D. [facility] [place] Office: [phone] Pager:  [phone]

## 2013-01-13 NOTE — Preoperative (Signed)
Beta Blockers   Reason not to administer Beta Blockers:Not Applicable 

## 2013-01-14 ENCOUNTER — Encounter (HOSPITAL_COMMUNITY): Payer: Self-pay | Admitting: Surgery

## 2013-01-14 NOTE — Progress Notes (Signed)
Marked wrong box for questions. No problems at home

## 2013-01-18 ENCOUNTER — Other Ambulatory Visit: Payer: Self-pay | Admitting: *Deleted

## 2013-01-18 MED ORDER — DIALYVITE 3000 3 MG PO TABS: 1.0000 | ORAL_TABLET | Freq: Every day | ORAL | Status: DC

## 2013-01-27 ENCOUNTER — Other Ambulatory Visit: Payer: Self-pay

## 2013-01-31 ENCOUNTER — Encounter (HOSPITAL_COMMUNITY): Payer: Self-pay | Admitting: Pharmacy Technician

## 2013-02-10 ENCOUNTER — Encounter (HOSPITAL_COMMUNITY): Payer: Self-pay | Admitting: *Deleted

## 2013-02-10 MED ORDER — PENTAFLUOROPROP-TETRAFLUOROETH EX AERO: 1.0000 "application " | INHALATION_SPRAY | CUTANEOUS | Status: DC | PRN

## 2013-02-10 MED ORDER — LIDOCAINE HCL (PF) 1 % IJ SOLN: 5.0000 mL | INTRAMUSCULAR | Status: DC | PRN

## 2013-02-10 MED ORDER — MUPIROCIN 2 % EX OINT: TOPICAL_OINTMENT | Freq: Two times a day (BID) | CUTANEOUS | Status: DC

## 2013-02-10 MED ORDER — ALTEPLASE 2 MG IJ SOLR: 2.0000 mg | Freq: Once | INTRAMUSCULAR | Status: DC | PRN

## 2013-02-10 MED ORDER — HEPARIN SODIUM (PORCINE) 1000 UNIT/ML DIALYSIS: 1000.0000 [IU] | INTRAMUSCULAR | Status: DC | PRN

## 2013-02-10 MED ORDER — LIDOCAINE-PRILOCAINE 2.5-2.5 % EX CREA: 1.0000 "application " | TOPICAL_CREAM | CUTANEOUS | Status: DC | PRN

## 2013-02-10 MED ORDER — NEPRO/CARBSTEADY PO LIQD: 237.0000 mL | ORAL | Status: DC | PRN

## 2013-02-10 MED ORDER — DEXTROSE 5 % IV SOLN
1.5000 g | INTRAVENOUS | Status: AC
  Administered 2013-02-11: 1.5 g via INTRAVENOUS
  Filled 2013-02-10: qty 1.5

## 2013-02-10 MED ORDER — HEPARIN SODIUM (PORCINE) 1000 UNIT/ML DIALYSIS: 20.0000 [IU]/kg | INTRAMUSCULAR | Status: DC | PRN

## 2013-02-10 MED ORDER — SODIUM CHLORIDE 0.9 % IV SOLN: 100.0000 mL | INTRAVENOUS | Status: DC | PRN

## 2013-02-11 ENCOUNTER — Encounter (HOSPITAL_COMMUNITY): Payer: Self-pay | Admitting: *Deleted

## 2013-02-11 ENCOUNTER — Observation Stay (HOSPITAL_COMMUNITY)
Admission: RE | Admit: 2013-02-11 | Discharge: 2013-02-12 | Disposition: A | Discharge status: Home Health | Payer: Medicare Other | Source: Ambulatory Visit | Attending: Surgery | Admitting: Surgery

## 2013-02-11 ENCOUNTER — Encounter (HOSPITAL_COMMUNITY)
Admission: RE | Disposition: A | Discharge status: Home Health | Payer: Self-pay | Source: Ambulatory Visit | Attending: Surgery

## 2013-02-11 ENCOUNTER — Ambulatory Visit (HOSPITAL_COMMUNITY): Payer: Medicare Other | Admitting: Certified Registered"

## 2013-02-11 ENCOUNTER — Encounter (HOSPITAL_COMMUNITY): Payer: Self-pay | Admitting: Certified Registered"

## 2013-02-11 ENCOUNTER — Telehealth: Payer: Self-pay | Admitting: Surgery

## 2013-02-11 DIAGNOSIS — G40909 Epilepsy, unspecified, not intractable, without status epilepticus: Secondary | ICD-10-CM | POA: Diagnosis present

## 2013-02-11 DIAGNOSIS — J189 Pneumonia, unspecified organism: Secondary | ICD-10-CM | POA: Diagnosis present

## 2013-02-11 DIAGNOSIS — Z992 Dependence on renal dialysis: Secondary | ICD-10-CM | POA: Insufficient documentation

## 2013-02-11 DIAGNOSIS — E872 Acidosis, unspecified: Secondary | ICD-10-CM | POA: Diagnosis present

## 2013-02-11 DIAGNOSIS — E785 Hyperlipidemia, unspecified: Secondary | ICD-10-CM | POA: Diagnosis present

## 2013-02-11 DIAGNOSIS — N186 End stage renal disease: Secondary | ICD-10-CM

## 2013-02-11 DIAGNOSIS — B958 Unspecified staphylococcus as the cause of diseases classified elsewhere: Secondary | ICD-10-CM | POA: Diagnosis present

## 2013-02-11 DIAGNOSIS — R578 Other shock: Secondary | ICD-10-CM | POA: Diagnosis present

## 2013-02-11 DIAGNOSIS — Y832 Surgical operation with anastomosis, bypass or graft as the cause of abnormal reaction of the patient, or of later complication, without mention of misadventure at the time of the procedure: Secondary | ICD-10-CM | POA: Diagnosis present

## 2013-02-11 DIAGNOSIS — N2581 Secondary hyperparathyroidism of renal origin: Secondary | ICD-10-CM | POA: Diagnosis present

## 2013-02-11 DIAGNOSIS — K922 Gastrointestinal hemorrhage, unspecified: Principal | ICD-10-CM | POA: Diagnosis present

## 2013-02-11 DIAGNOSIS — I120 Hypertensive chronic kidney disease with stage 5 chronic kidney disease or end stage renal disease: Secondary | ICD-10-CM | POA: Insufficient documentation

## 2013-02-11 DIAGNOSIS — Z79899 Other long term (current) drug therapy: Secondary | ICD-10-CM

## 2013-02-11 DIAGNOSIS — R7881 Bacteremia: Secondary | ICD-10-CM | POA: Diagnosis present

## 2013-02-11 DIAGNOSIS — F79 Unspecified intellectual disabilities: Secondary | ICD-10-CM | POA: Diagnosis present

## 2013-02-11 DIAGNOSIS — R0902 Hypoxemia: Secondary | ICD-10-CM | POA: Diagnosis present

## 2013-02-11 DIAGNOSIS — Y92009 Unspecified place in unspecified non-institutional (private) residence as the place of occurrence of the external cause: Secondary | ICD-10-CM

## 2013-02-11 DIAGNOSIS — D631 Anemia in chronic kidney disease: Secondary | ICD-10-CM | POA: Diagnosis present

## 2013-02-11 DIAGNOSIS — K219 Gastro-esophageal reflux disease without esophagitis: Secondary | ICD-10-CM | POA: Diagnosis present

## 2013-02-11 DIAGNOSIS — T82898A Other specified complication of vascular prosthetic devices, implants and grafts, initial encounter: Secondary | ICD-10-CM | POA: Diagnosis present

## 2013-02-11 DIAGNOSIS — R4701 Aphasia: Secondary | ICD-10-CM | POA: Diagnosis present

## 2013-02-11 HISTORY — PX: AV FISTULA PLACEMENT: SHX1204

## 2013-02-11 LAB — POCT I-STAT 4, (NA,K, GLUC, HGB,HCT)
Glucose, Bld: 85 mg/dL (ref 70–99)
HCT: 42 % (ref 39.0–52.0)
Sodium: 143 mEq/L (ref 135–145)

## 2013-02-11 SURGERY — INSERTION OF ARTERIOVENOUS (AV) GORE-TEX GRAFT THIGH
Anesthesia: General | Site: Leg Upper | Laterality: Left | Wound class: Clean

## 2013-02-11 MED ORDER — OXYCODONE HCL 5 MG PO TABS: 5.0000 mg | ORAL_TABLET | ORAL | Status: DC | PRN

## 2013-02-11 MED ORDER — CLONAZEPAM 0.5 MG PO TABS
0.2500 mg | ORAL_TABLET | Freq: Two times a day (BID) | ORAL | Status: DC
  Filled 2013-02-11: qty 1

## 2013-02-11 MED ORDER — PROPOFOL 10 MG/ML IV BOLUS
INTRAVENOUS | Status: DC | PRN
  Administered 2013-02-11: 70 mg via INTRAVENOUS

## 2013-02-11 MED ORDER — HEPARIN SODIUM (PORCINE) 5000 UNIT/ML IJ SOLN
5000.0000 [IU] | INTRAMUSCULAR | Status: AC
  Administered 2013-02-11: 5000 [IU] via INTRAVENOUS

## 2013-02-11 MED ORDER — DIALYVITE 3000 3 MG PO TABS: 1.0000 | ORAL_TABLET | Freq: Every day | ORAL | Status: DC

## 2013-02-11 MED ORDER — 0.9 % SODIUM CHLORIDE (POUR BTL) OPTIME
TOPICAL | Status: DC | PRN
  Administered 2013-02-11: 1000 mL

## 2013-02-11 MED ORDER — ATORVASTATIN CALCIUM 20 MG PO TABS
20.0000 mg | ORAL_TABLET | Freq: Every day | ORAL | Status: DC
  Filled 2013-02-11 (×2): qty 1

## 2013-02-11 MED ORDER — SEVELAMER CARBONATE 800 MG PO TABS
800.0000 mg | ORAL_TABLET | Freq: Three times a day (TID) | ORAL | Status: DC
  Filled 2013-02-11 (×5): qty 1

## 2013-02-11 MED ORDER — HYDROMORPHONE HCL PF 1 MG/ML IJ SOLN
0.2500 mg | INTRAMUSCULAR | Status: DC | PRN
  Administered 2013-02-11 (×3): 0.25 mg via INTRAVENOUS

## 2013-02-11 MED ORDER — PROTAMINE SULFATE 10 MG/ML IV SOLN
INTRAVENOUS | Status: DC | PRN
  Administered 2013-02-11: 50 mg via INTRAVENOUS

## 2013-02-11 MED ORDER — PHENOL 1.4 % MT LIQD
1.0000 | OROMUCOSAL | Status: DC | PRN
  Filled 2013-02-11: qty 177

## 2013-02-11 MED ORDER — LABETALOL HCL 5 MG/ML IV SOLN
10.0000 mg | INTRAVENOUS | Status: DC | PRN
  Filled 2013-02-11: qty 4

## 2013-02-11 MED ORDER — SUFENTANIL CITRATE 50 MCG/ML IV SOLN
INTRAVENOUS | Status: DC | PRN
  Administered 2013-02-11 (×2): 5 ug via INTRAVENOUS

## 2013-02-11 MED ORDER — ACETAMINOPHEN 325 MG RE SUPP
325.0000 mg | RECTAL | Status: DC | PRN
  Filled 2013-02-11: qty 2

## 2013-02-11 MED ORDER — PANTOPRAZOLE SODIUM 40 MG PO TBEC
40.0000 mg | DELAYED_RELEASE_TABLET | Freq: Every day | ORAL | Status: DC
Start: 2013-02-12 — End: 2013-02-12
  Filled 2013-02-11 (×2): qty 1

## 2013-02-11 MED ORDER — MIDAZOLAM HCL 5 MG/5ML IJ SOLN
INTRAMUSCULAR | Status: DC | PRN
  Administered 2013-02-11: 2 mg via INTRAVENOUS

## 2013-02-11 MED ORDER — HEPARIN SODIUM (PORCINE) 1000 UNIT/ML IJ SOLN
INTRAMUSCULAR | Status: DC | PRN
  Administered 2013-02-11: 6000 [IU] via INTRAVENOUS

## 2013-02-11 MED ORDER — SODIUM CHLORIDE 0.9 % IR SOLN
Status: DC | PRN
  Administered 2013-02-11: 11:00:00

## 2013-02-11 MED ORDER — HYDRALAZINE HCL 20 MG/ML IJ SOLN: 10.0000 mg | INTRAMUSCULAR | Status: DC | PRN

## 2013-02-11 MED ORDER — PANTOPRAZOLE SODIUM 40 MG PO TBEC: 40.0000 mg | DELAYED_RELEASE_TABLET | Freq: Every day | ORAL | Status: DC

## 2013-02-11 MED ORDER — DOCUSATE SODIUM 100 MG PO CAPS
100.0000 mg | ORAL_CAPSULE | Freq: Every day | ORAL | Status: DC
  Filled 2013-02-11: qty 1

## 2013-02-11 MED ORDER — ACETAMINOPHEN 325 MG PO TABS: 325.0000 mg | ORAL_TABLET | ORAL | Status: DC | PRN

## 2013-02-11 MED ORDER — OXYCODONE HCL 5 MG PO TABS
5.0000 mg | ORAL_TABLET | ORAL | Status: DC | PRN
  Filled 2013-02-11: qty 2

## 2013-02-11 MED ORDER — RENA-VITE PO TABS
1.0000 | ORAL_TABLET | Freq: Every day | ORAL | Status: DC
  Filled 2013-02-11 (×2): qty 1

## 2013-02-11 MED ORDER — MIDODRINE HCL 5 MG PO TABS
15.0000 mg | ORAL_TABLET | Freq: Three times a day (TID) | ORAL | Status: DC
  Filled 2013-02-11 (×5): qty 3

## 2013-02-11 MED ORDER — HEMOSTATIC AGENTS (NO CHARGE) OPTIME
TOPICAL | Status: DC | PRN
  Administered 2013-02-11 (×2): 1 via TOPICAL

## 2013-02-11 MED ORDER — ONDANSETRON HCL 4 MG/2ML IJ SOLN: 4.0000 mg | Freq: Once | INTRAMUSCULAR | Status: DC | PRN

## 2013-02-11 MED ORDER — MORPHINE SULFATE 2 MG/ML IJ SOLN
2.0000 mg | INTRAMUSCULAR | Status: DC | PRN
  Administered 2013-02-11: 2 mg via INTRAVENOUS
  Filled 2013-02-11: qty 1

## 2013-02-11 MED ORDER — DEXTROSE 5 % IV SOLN
1.5000 g | Freq: Two times a day (BID) | INTRAVENOUS | Status: AC
  Administered 2013-02-11 – 2013-02-12 (×2): 1.5 g via INTRAVENOUS
  Filled 2013-02-11 (×2): qty 1.5

## 2013-02-11 MED ORDER — ONDANSETRON HCL 4 MG/2ML IJ SOLN
INTRAMUSCULAR | Status: DC | PRN
  Administered 2013-02-11: 4 mg via INTRAVENOUS

## 2013-02-11 MED ORDER — SODIUM CHLORIDE 0.9 % IV SOLN
INTRAVENOUS | Status: DC
  Administered 2013-02-11: 35 mL/h via INTRAVENOUS

## 2013-02-11 MED ORDER — METOPROLOL TARTRATE 1 MG/ML IV SOLN
2.0000 mg | INTRAVENOUS | Status: DC | PRN
Start: 2013-02-11 — End: 2013-02-12
  Filled 2013-02-11: qty 5

## 2013-02-11 MED ORDER — THROMBIN 20000 UNITS EX SOLR
CUTANEOUS | Status: AC
  Filled 2013-02-11: qty 20000

## 2013-02-11 MED ORDER — HYDROMORPHONE HCL PF 1 MG/ML IJ SOLN
INTRAMUSCULAR | Status: AC
  Filled 2013-02-11: qty 1

## 2013-02-11 MED ORDER — ACETAMINOPHEN 10 MG/ML IV SOLN: 1000.0000 mg | Freq: Once | INTRAVENOUS | Status: DC | PRN

## 2013-02-11 MED ORDER — ONDANSETRON HCL 4 MG/2ML IJ SOLN: 4.0000 mg | Freq: Four times a day (QID) | INTRAMUSCULAR | Status: DC | PRN

## 2013-02-11 MED ORDER — PHENYTOIN SODIUM EXTENDED 100 MG PO CAPS
200.0000 mg | ORAL_CAPSULE | Freq: Every day | ORAL | Status: DC
  Filled 2013-02-11 (×2): qty 2

## 2013-02-11 SURGICAL SUPPLY — 47 items
ADH SKN CLS APL DERMABOND .7 (GAUZE/BANDAGES/DRESSINGS) ×2
CANISTER SUCTION 2500CC (MISCELLANEOUS) ×2 IMPLANT
CLIP TI MEDIUM 6 (CLIP) ×2 IMPLANT
CLIP TI WIDE RED SMALL 6 (CLIP) ×3 IMPLANT
CLOTH BEACON ORANGE TIMEOUT ST (SAFETY) ×2 IMPLANT
COVER SURGICAL LIGHT HANDLE (MISCELLANEOUS) ×2 IMPLANT
DERMABOND ADVANCED (GAUZE/BANDAGES/DRESSINGS) ×2
DERMABOND ADVANCED .7 DNX12 (GAUZE/BANDAGES/DRESSINGS) ×1 IMPLANT
DRAPE INCISE IOBAN 66X45 STRL (DRAPES) ×1 IMPLANT
ELECT REM PT RETURN 9FT ADLT (ELECTROSURGICAL) ×2
ELECTRODE REM PT RTRN 9FT ADLT (ELECTROSURGICAL) ×1 IMPLANT
GLOVE BIO SURGEON STRL SZ7.5 (GLOVE) ×3 IMPLANT
GLOVE BIOGEL PI IND STRL 6.5 (GLOVE) IMPLANT
GLOVE BIOGEL PI IND STRL 7.0 (GLOVE) IMPLANT
GLOVE BIOGEL PI IND STRL 7.5 (GLOVE) ×1 IMPLANT
GLOVE BIOGEL PI INDICATOR 6.5 (GLOVE) ×3
GLOVE BIOGEL PI INDICATOR 7.0 (GLOVE) ×2
GLOVE BIOGEL PI INDICATOR 7.5 (GLOVE) ×4
GLOVE ECLIPSE 6.5 STRL STRAW (GLOVE) ×2 IMPLANT
GLOVE SS BIOGEL STRL SZ 6.5 (GLOVE) IMPLANT
GLOVE SUPERSENSE BIOGEL SZ 6.5 (GLOVE) ×1
GLOVE SURG SS PI 7.5 STRL IVOR (GLOVE) ×3 IMPLANT
GOWN PREVENTION PLUS XLARGE (GOWN DISPOSABLE) ×1 IMPLANT
GOWN PREVENTION PLUS XXLARGE (GOWN DISPOSABLE) ×2 IMPLANT
GOWN STRL NON-REIN LRG LVL3 (GOWN DISPOSABLE) ×6 IMPLANT
GRAFT GORETEX STRT 6X50 (Vascular Products) ×1 IMPLANT
HEMOSTAT SNOW SURGICEL 2X4 (HEMOSTASIS) ×2 IMPLANT
HEMOSTAT SURGICEL 2X14 (HEMOSTASIS) IMPLANT
KIT BASIN OR (CUSTOM PROCEDURE TRAY) ×2 IMPLANT
KIT ROOM TURNOVER OR (KITS) ×2 IMPLANT
NS IRRIG 1000ML POUR BTL (IV SOLUTION) ×2 IMPLANT
PACK CV ACCESS (CUSTOM PROCEDURE TRAY) ×2 IMPLANT
PAD ARMBOARD 7.5X6 YLW CONV (MISCELLANEOUS) ×4 IMPLANT
SPONGE LAP 18X18 X RAY DECT (DISPOSABLE) ×1 IMPLANT
SUT PROLENE 5 0 C 1 24 (SUTURE) ×1 IMPLANT
SUT PROLENE 6 0 BV (SUTURE) ×2 IMPLANT
SUT SILK 2 0 SH (SUTURE) ×2 IMPLANT
SUT VIC AB 2-0 CT1 27 (SUTURE) ×2
SUT VIC AB 2-0 CT1 TAPERPNT 27 (SUTURE) IMPLANT
SUT VIC AB 3-0 SH 27 (SUTURE) ×4
SUT VIC AB 3-0 SH 27X BRD (SUTURE) ×1 IMPLANT
SUT VICRYL 4-0 PS2 18IN ABS (SUTURE) ×2 IMPLANT
TAPE STRIPS DRAPE STRL (GAUZE/BANDAGES/DRESSINGS) ×1 IMPLANT
TOWEL OR 17X24 6PK STRL BLUE (TOWEL DISPOSABLE) ×3 IMPLANT
TOWEL OR 17X26 10 PK STRL BLUE (TOWEL DISPOSABLE) ×2 IMPLANT
UNDERPAD 30X30 INCONTINENT (UNDERPADS AND DIAPERS) ×2 IMPLANT
WATER STERILE IRR 1000ML POUR (IV SOLUTION) ×2 IMPLANT

## 2013-02-11 NOTE — Anesthesia Procedure Notes (Signed)
Procedure Name: LMA Insertion Date/Time: 02/11/2013 10:50 AM Performed by: Charm Barges, Elis Sauber R Pre-anesthesia Checklist: Patient identified, Emergency Drugs available, Suction available and Timeout performed Patient Re-evaluated:Patient Re-evaluated prior to inductionOxygen Delivery Method: Circle system utilized Preoxygenation: Pre-oxygenation with 100% oxygen Intubation Type: IV induction LMA: LMA inserted LMA Size: 5.0 Number of attempts: 1 Placement Confirmation: positive ETCO2 and breath sounds checked- equal and bilateral Tube secured with: Tape Dental Injury: Teeth and Oropharynx as per pre-operative assessment

## 2013-02-11 NOTE — Progress Notes (Signed)
Attempted twice to start IV on pt. Pt get pulling arms away and would not even let me look for possible access. Dr. Allena Katz notified and MD is ok with using HD cath since pt will not let me place peripheral IV. [place], Rosanna Randy

## 2013-02-11 NOTE — Progress Notes (Signed)
10:20 AM   I was instructed by Dr. Noreene Larsson to access Mr. Steck' right femoral cath.  I did this without fuss or fight..  I cleaned off venous hub x 3 min...aspirated 10cc of blood, discarded that and aspirated 7cc for lab work.  NS 500cc added to line with good infusion noted...Marland KitchenMarland KitchenDA

## 2013-02-11 NOTE — H&P (Signed)
[facility] [place]   Patient name: [name] MRN: [id number] DOB: 05/31/1951 Sex: male     Chief Complaint  Patient presents with  . Routine Post Op    3 wk f/u catheter insertion 12/14/2012    HISTORY OF PRESENT ILLNESS: The patient comes in for dialysis access management. 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 He recently had multiple attempts at declotting a right thigh graft. He ultimately had to have a left-sided catheter placed. His back today to discuss further options. A  Past Medical History  Diagnosis Date  . Upper respiratory infection 12/12/2009  . Heart murmur, systolic 08/10/2009  . 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 Hypotension 05/26/2009  . Syncope 05/07/2009  . 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 Esophageal varices 11/07/2008  . Redness or discharge of eye 11/06/2008  . Skin tag 08/25/2008  . Gastric ulcer 10/09    antral, with h pylori positive  . Abdominal pain, acute, generalized 06/28/2008  . Congestive heart failure 03/06/2008  . Cellulitis and abscess of leg, except foot 03/06/2008  . Sinus tachycardia 03/06/2008  . Edema 03/06/2008  . Impacted cerumen of both ears 03/06/2008  . Secondary hyperparathyroidism 02/02/2008  . Mute 02/02/2008  . Mental retardation 02/02/2008  . Hypertension 02/02/2008  . Hyperlipidemia 02/02/2008  . Anemia 02/02/2008  . ESRD (end stage renal disease)     TTS hemodialysis  . Seizures   . GERD (gastroesophageal reflux disease)     Past Surgical History  Procedure Laterality Date  . Left forearm graft      for HD  . Arteriovenous graft placement  11/22/10    Right thigh AVG  . Thrombectomy and revision of arterioventous (av) goretex  graft    . Thrombectomy and revision of arterioventous (av) goretex  graft  10/22/2012    Procedure: THROMBECTOMY AND REVISION OF ARTERIOVENTOUS (AV) GORETEX  GRAFT;  Surgeon: Todd F Early, MD;  Location: [facility] OR;  Service: Vascular;  Laterality: Right;   . Thrombectomy w/ embolectomy  11/10/2012    Procedure: THROMBECTOMY ARTERIOVENOUS GORE-TEX GRAFT;  Surgeon: James D Lawson, MD;  Location: [facility] OR;  Service: Vascular;  Laterality: Right;  . Thrombectomy and revision of arterioventous (av) goretex  graft Right 12/08/2012    Procedure: THROMBECTOMY AND REVISION OF ARTERIOVENTOUS (AV) GORETEX  GRAFT right thigh;  Surgeon: Charles E Fields, MD;  Location: [facility] OR;  Service: Vascular;  Laterality: Right;  . Venogram N/A 12/08/2012    Procedure: VENOGRAM;  Surgeon: Charles E Fields, MD;  Location: [facility] OR;  Service: Vascular;  Laterality: N/A;  Intraoperative Central venogram  . Thrombectomy w/ embolectomy Right 12/12/2012    Procedure: THROMBECTOMY ARTERIOVENOUS GORE-TEX GRAFT;  Surgeon: Graysen Woodyard W Riki Berninger, MD;  Location: [facility] OR;  Service: Vascular;  Laterality: Right;  . Insertion of dialysis catheter Left 12/14/2012    Procedure: INSERTION OF DIALYSIS CATHETER;  Surgeon: Tavoris Brisk W Shaketha Jeon, MD;  Location: [facility] OR;  Service: Vascular;  Laterality: Left;    History   Social History  . Marital Status: Single    Spouse Name: N/A    Number of Children: N/A  . Years of Education: N/A   Occupational History  . Not on file.   Social History Main Topics  . Smoking status: Never Smoker   . Smokeless tobacco: Not on file  . Alcohol Use: No  . Drug Use: No  . Sexually Active: No   Other Topics Concern  . Not on file     Social History Narrative  . No narrative on file    History reviewed. No pertinent family history.  Allergies as of 01/10/2013  . (No Known Allergies)    Current Outpatient Prescriptions on File Prior to Visit  Medication Sig Dispense Refill  . atorvastatin (LIPITOR) 20 MG tablet Take 20 mg by mouth at bedtime.       . clonazePAM (KLONOPIN) 0.5 MG tablet Take 0.5 tablets (0.25 mg total) by mouth 2 (two) times daily.  30 tablet  3  . folic acid-vitamin b complex-vitamin c-selenium-zinc (DIALYVITE) 3 MG TABS Take 1 tablet by mouth daily.       . midodrine (PROAMATINE) 10 MG tablet Take 15 mg by mouth 3 (three) times daily.       . pantoprazole (PROTONIX) 40 MG tablet Take 1 tablet (40 mg total) by mouth daily. Please note change to once a day.  90 tablet  3  . phenytoin (DILANTIN) 100 MG ER capsule Take 200 mg by mouth at bedtime.      . sevelamer carbonate (RENVELA) 800 MG tablet Take 800 mg by mouth 3 (three) times daily with meals.       No current facility-administered medications on file prior to visit.     REVIEW OF SYSTEMS: Due to the patient's mental disabilities, these questions cannot be answered PHYSICAL EXAMINATION:   Vital signs are BP 95/50  Pulse 82  Ht 5' 2" (1.575 m)  Wt 126 lb 8 oz (57.38 kg)  BMI 23.13 kg/m2  SpO2 100% General: The patient appears their stated age. HEENT:  No gross abnormalities Pulmonary:  Non labored breathing Abdomen: Soft and non-tender Musculoskeletal: There are no major deformities. Neurologic: No focal weakness or paresthesias are detected, Skin: The right femoral incision is completely healed Psychiatric: The patient has normal affect. Cardiovascular: There is a regular rate and rhythm without significant murmur appreciated.   Diagnostic Studies A formal ultrasound of the right femoral vein was obtained. This is widely patent and compressible  Assessment: End-stage renal disease Plan: I plan on placing a right femoral catheter and removed the left femoral catheter this Thursday. Approximately one week later I will proceed with a left thigh graft.  V. Wells Adreena Willits IV, M.D. [facility] [place] Office: [phone] Pager:  [phone]     focal weakness or paresthesias are detected, Skin: The right femoral incision is completely healed Psychiatric: The patient has normal affect. Cardiovascular: There is a regular rate and rhythm without significant murmur appreciated.     Diagnostic Studies A formal ultrasound of the right femoral vein was obtained. This is widely patent and compressible   Assessment: End-stage renal disease Plan: I plan on placing a right femoral catheter and removed the left femoral catheter this Thursday. Approximately one week later I will proceed with a left thigh graft.   Jorge Ny, M.D. [facility]  [place] Office: [phone] Pager:  [phone]

## 2013-02-11 NOTE — Op Note (Signed)
[facility] [place]  Patient name: [name] MRN: [id number] DOB: 22-Feb-1951 Sex: male  02/11/2013 Pre-operative Diagnosis: End stage renal disease Post-operative diagnosis:  Same Surgeon:  Jorge Ny Assistants:   Della Goo Procedure:   Left thigh Gore-Tex graft (6 mm ) Anesthesia:  GEN  Blood Loss:  See anesthesia record Specimens:  None  Findings:  Venous anastomosis was end to end at the saphenofemoral junction. Arterial anastomosis was to the proximal superficial femoral artery  Indications:  The patient recently had a left thigh catheter removed in anticipation of a thigh graft today.  Procedure:  The patient was identified in the holding area and taken to [facility] OR ROOM 12  The patient was then placed supine on the table. general anesthesia was administered.  The patient was prepped and draped in the usual sterile fashion.  A time out was called and antibiotics were administered.  I made a vertical incision just below the inguinal ligaments. Cautery was used to divide the subcutaneous tissue down to the femoral sheath. There was a fair amount of scar tissue from his previous catheter. I dissected out the saphenous vein as well as the saphenofemoral junction. Next, I dissected out the artery. The patient had approximately a 4 mm superficial femoral artery. He had a very high origin of the profunda femoral artery. I did minimal dissection around the common femoral artery. Once I had adequate superficial femoral artery exposed I created a tunnel in the left thigh. A counterincision was made to pass the tunneler. A 6 mm Gore-Tex graft was brought through the tunnel. The patient was fully heparinized. After the heparin had circulated the superficial femoral artery was occluded with vascular clamps. A #11 blade was used to make an arteriotomy which was extended longitudinally with Potts scissors. The patient had very soft plaque within the artery. The intima  was very friable. The graft was spatulated to fit the arteriotomy. A running anastomosis in end to side configuration was performed with 6-0 Prolene. Appropriate flushing maneuvers were performed. The graft was filled with heparinized saline and reoccluded. Next I ligated the saphenous vein just beyond the saphenofemoral junction. I used tenotomy scissors to cut the valves at the saphenofemoral junction. I then created a end to end anastomosis with running 6-0 Prolene. Prior to completion, the appropriate flushing maneuvers were performed. The anastomosis was then completed. Blood flow was then reestablished to the graft. There is a good pulse within the graft. 50 mg of protamine was administered. The counterincision was closed with 3-0 Vicryl. I then closed the groin incision with multiple layers of 203 0 Vicryl. Dermabond placed on the skin. There no complications.   Disposition:  To PACU in stable condition.   Juleen China, M.D. [facility] [place] Office: [phone] Pager:  [phone] end-stage renal disease

## 2013-02-11 NOTE — Discharge Summary (Signed)
[facility] Specialists Discharge Summary   Patient ID:  [name] MRN: [id number] DOB/AGE: [birth year] 62 y.o.  Admit date: 02/11/2013 Discharge date: 02/11/2013 Date of Surgery: 02/12/2013 Surgeon: Surgeon(s): Nada Libman, MD  Admission Diagnosis: End Stage Renal Disease  Discharge Diagnoses:  End Stage Renal Disease  Secondary Diagnoses: Past Medical History  Diagnosis Date  . Upper respiratory infection 12/12/2009  . Heart murmur, systolic 08/10/2009  . 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 Hypotension 05/26/2009  . Syncope 05/07/2009  . 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 Esophageal varices 11/07/2008  . Redness or discharge of eye 11/06/2008  . Skin tag 08/25/2008  . Gastric ulcer 10/09    antral, with h pylori positive  . Abdominal pain, acute, generalized 06/28/2008  . Congestive heart failure 03/06/2008  . Cellulitis and abscess of leg, except foot 03/06/2008  . Sinus tachycardia 03/06/2008  . Edema 03/06/2008  . Impacted cerumen of both ears 03/06/2008  . Secondary hyperparathyroidism 02/02/2008  . Mute 02/02/2008  . Mental retardation 02/02/2008  . Hyperlipidemia 02/02/2008  . Anemia 02/02/2008  . ESRD (end stage renal disease)     TTS hemodialysis  . GERD (gastroesophageal reflux disease)   . Hypertension 02/02/2008    in history  . Seizures     "non in a while at home"    Procedure(s): Left INSERTION OF ARTERIOVENOUS (AV) GORE-TEX GRAFT THIGH  Discharged Condition: good  HPI: [name] is a 62 y.o. male The patient comes in for dialysis access management. 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 He recently had multiple attempts at declotting a right thigh graft. He ultimately had to have a left-sided catheter placed. His back today to discuss further options. He is admitted for left thigh graft placement.  Hospital Course:  [name] is a 62 y.o. male is S/P Left Procedure(s): INSERTION OF ARTERIOVENOUS (AV) GORE-TEX  GRAFT THIGH Extubated: POD # 0 Physical exam: Incision clean and dry.  Palpable thrill in graft Post-op wounds clean, dry, intact or healing well Pt. Ambulating, voiding and taking PO diet without difficulty. Pt pain controlled with PO pain meds. Labs as below Complications:none  Consults:     Significant Diagnostic Studies: CBC Lab Results  Component Value Date   WBC 5.2 12/25/2012   HGB 14.3 02/11/2013   HCT 42.0 02/11/2013   MCV 97.5 12/25/2012   PLT 224 12/25/2012    BMET    Component Value Date/Time   NA 143 02/11/2013 1032   K 4.0 02/11/2013 1032   CL 104 12/25/2012 0550   CO2 29 12/25/2012 0550   GLUCOSE 85 02/11/2013 1032   BUN 25* 12/25/2012 0550   CREATININE 7.75* 12/25/2012 0550   CALCIUM 9.4 12/25/2012 0550   GFRNONAA 7* 12/25/2012 0550   GFRAA 8* 12/25/2012 0550   COAG Lab Results  Component Value Date   INR 1.43 11/24/2010   INR 1.88* 11/24/2010   INR 1.31 12/30/2009     Disposition:  Discharge to :Home Discharge Orders   Future Appointments Provider Department Dept Phone   03/02/2013 11:00 AM Nilda Riggs, NP [facility] [phone]   Future Orders Complete By Expires     Call MD for:  redness, tenderness, or signs of infection (pain, swelling, bleeding, redness, odor or green/yellow discharge around incision site)  As directed     Call MD for:  severe or increased pain, loss or decreased feeling  in affected limb(s)  As directed  Call MD for:  temperature >100.5  As directed     Increase activity slowly  As directed     Comments:      Walk with assistance use walker or cane as needed    No dressing needed  As directed     Resume previous diet  As directed     may wash over wound with mild soap and water  As directed         Medication List    TAKE these medications       atorvastatin 20 MG tablet  Commonly known as:  LIPITOR  Take 20 mg by mouth at bedtime.     clonazePAM 0.5 MG tablet  Commonly known as:  KLONOPIN  Take 0.5  tablets (0.25 mg total) by mouth 2 (two) times daily.     folic acid-vitamin b complex-vitamin c-selenium-zinc 3 MG Tabs  Take 1 tablet by mouth daily.     midodrine 10 MG tablet  Commonly known as:  PROAMATINE  Take 15 mg by mouth 3 (three) times daily.     oxyCODONE 5 MG immediate release tablet  Commonly known as:  ROXICODONE  Take 1 tablet (5 mg total) by mouth every 4 (four) hours as needed for pain.     pantoprazole 40 MG tablet  Commonly known as:  PROTONIX  Take 40 mg by mouth daily.     phenytoin 100 MG ER capsule  Commonly known as:  DILANTIN  Take 200 mg by mouth at bedtime.     sevelamer carbonate 800 MG tablet  Commonly known as:  RENVELA  Take 800 mg by mouth 3 (three) times daily with meals.       Verbal and written Discharge instructions given to the patient. Wound care per Discharge AVS     Follow-up Information   Follow up with Myra Gianotti IV, Lala Lund, MD In 4 weeks. (office will arrange-sent)    Contact information:   [address] [place] [address] [phone]       Signed: Marlowe Shores 02/11/2013, 12:42 PM

## 2013-02-11 NOTE — Anesthesia Postprocedure Evaluation (Signed)
  Anesthesia Post-op Note  Patient: [name]  Procedure(s) Performed: Procedure(s) with comments: INSERTION OF ARTERIOVENOUS (AV) GORE-TEX GRAFT THIGH (Left) - using 6mm x 50cm Gore-Tex Vascular Graft  Patient Location: PACU  Anesthesia Type:General  Level of Consciousness: awake, alert  and oriented  Airway and Oxygen Therapy: Patient Spontanous Breathing  Post-op Pain: none  Post-op Assessment: Post-op Vital signs reviewed, Patient's Cardiovascular Status Stable, Respiratory Function Stable, Patent Airway and Pain level controlled  Post-op Vital Signs: stable  Complications: No apparent anesthesia complications

## 2013-02-11 NOTE — Anesthesia Preprocedure Evaluation (Signed)
Anesthesia Evaluation  Patient identified by MRN, date of birth, ID band Patient awake    Reviewed: Allergy & Precautions, H&P , NPO status , Patient's Chart, lab work & pertinent test results  Airway Mallampati: II      Dental  (+) Teeth Intact and Dental Advisory Given   Pulmonary  breath sounds clear to auscultation        Cardiovascular Rhythm:Regular Rate:Normal     Neuro/Psych    GI/Hepatic   Endo/Other    Renal/GU      Musculoskeletal   Abdominal   Peds  Hematology   Anesthesia Other Findings   Reproductive/Obstetrics                           Anesthesia Physical Anesthesia Plan  ASA: III  Anesthesia Plan: General   Post-op Pain Management:    Induction: Intravenous  Airway Management Planned: LMA  Additional Equipment:   Intra-op Plan:   Post-operative Plan:   Informed Consent: I have reviewed the patients History and Physical, chart, labs and discussed the procedure including the risks, benefits and alternatives for the proposed anesthesia with the patient or authorized representative who has indicated his/her understanding and acceptance.   Consent reviewed with POA and Dental advisory given  Plan Discussed with: CRNA and Anesthesiologist  Anesthesia Plan Comments: (ESRD last HD 4/24 Htn Mental retardation Seizure disorder on dilantin   Plan GA with LMA)        Anesthesia Quick Evaluation

## 2013-02-11 NOTE — Telephone Encounter (Addendum)
Message copied by Fredrich Birks on Fri Feb 11, 2013  3:22 PM ------      Message from: Melene Plan      Created: Fri Feb 11, 2013  1:39 PM                   ----- Message -----         From: Marlowe Shores, PA-C         Sent: 02/11/2013  12:41 PM           To: Melene Plan, RN, [facility] Admin Pool            2-3 weeks - s/p thigh graft-[place] ------  02/11/13: lm for pt at cell #, dpm

## 2013-02-11 NOTE — Transfer of Care (Signed)
Immediate Anesthesia Transfer of Care Note  Patient: [name]  Procedure(s) Performed: Procedure(s) with comments: INSERTION OF ARTERIOVENOUS (AV) GORE-TEX GRAFT THIGH (Left) - using 6mm x 50cm Gore-Tex Vascular Graft  Patient Location: PACU  Anesthesia Type:General  Level of Consciousness: awake  Airway & Oxygen Therapy: Patient Spontanous Breathing and Patient connected to nasal cannula oxygen  Post-op Assessment: Report given to PACU RN, Post -op Vital signs reviewed and stable and Patient moving all extremities  Post vital signs: Reviewed and stable  Complications: No apparent anesthesia complications

## 2013-02-12 NOTE — Progress Notes (Signed)
[facility] [place]  Subjective  - Family member in room states the patient is doing fine and wants to go home.  Patient is alert, but does not verbally communicate.   Objective 66/34 113 98.5 F (36.9 C) (Oral) 18 98%  Intake/Output Summary (Last 24 hours) at 02/12/13 0843 Last data filed at 02/11/13 1255  Gross per 24 hour  Intake    300 ml  Output      0 ml  Net    300 ml    Left thigh graft palpable thrill Incision without signs of infection.  Assessment/Planning: POD #1  D/C home this am.  He will have dialysis at his normal center. F/U PRN  Clinton Gallant [facility] 02/12/2013 8:43 AM --  Laboratory Lab Results:  Recent Labs  02/11/13 1032  HGB 14.3  HCT 42.0   BMET  Recent Labs  02/11/13 1032  NA 143  K 4.0  GLUCOSE 85    COAG Lab Results  Component Value Date   INR 1.43 11/24/2010   INR 1.88* 11/24/2010   INR 1.31 12/30/2009   No results found for this basename: PTT

## 2013-02-12 NOTE — Progress Notes (Signed)
Patient for discharge to home this am. IV team called to disconnect IV fluids from HD catheter. Discharge instructions given to patient and sister "Talbert Forest." No questions at this time.

## 2013-02-12 NOTE — Progress Notes (Signed)
Utilization Review Completed.   Kazaria Gaertner, RN, BSN Nurse Case Manager  [phone]  

## 2013-02-13 ENCOUNTER — Encounter (HOSPITAL_COMMUNITY): Payer: Self-pay

## 2013-02-13 ENCOUNTER — Inpatient Hospital Stay (HOSPITAL_COMMUNITY)
Admission: EM | Admit: 2013-02-13 | Discharge: 2013-02-20 | DRG: 356 | Disposition: A | Discharge status: Home Health | Payer: Medicare Other | Attending: Internal Medicine | Admitting: Internal Medicine

## 2013-02-13 ENCOUNTER — Emergency Department (HOSPITAL_COMMUNITY): Payer: Medicare Other

## 2013-02-13 DIAGNOSIS — D649 Anemia, unspecified: Secondary | ICD-10-CM | POA: Diagnosis present

## 2013-02-13 DIAGNOSIS — R569 Unspecified convulsions: Secondary | ICD-10-CM

## 2013-02-13 DIAGNOSIS — J189 Pneumonia, unspecified organism: Secondary | ICD-10-CM

## 2013-02-13 DIAGNOSIS — I959 Hypotension, unspecified: Secondary | ICD-10-CM

## 2013-02-13 DIAGNOSIS — R7881 Bacteremia: Secondary | ICD-10-CM

## 2013-02-13 DIAGNOSIS — R579 Shock, unspecified: Secondary | ICD-10-CM | POA: Diagnosis present

## 2013-02-13 DIAGNOSIS — N186 End stage renal disease: Secondary | ICD-10-CM | POA: Diagnosis present

## 2013-02-13 DIAGNOSIS — K259 Gastric ulcer, unspecified as acute or chronic, without hemorrhage or perforation: Secondary | ICD-10-CM

## 2013-02-13 DIAGNOSIS — D509 Iron deficiency anemia, unspecified: Secondary | ICD-10-CM

## 2013-02-13 DIAGNOSIS — K922 Gastrointestinal hemorrhage, unspecified: Secondary | ICD-10-CM | POA: Diagnosis present

## 2013-02-13 LAB — CBC WITH DIFFERENTIAL/PLATELET
Basophils Absolute: 0 10*3/uL (ref 0.0–0.1)
Basophils Absolute: 0 10*3/uL (ref 0.0–0.1)
Basophils Relative: 0 % (ref 0–1)
Basophils Relative: 0 % (ref 0–1)
Eosinophils Absolute: 0 10*3/uL (ref 0.0–0.7)
Eosinophils Absolute: 0 10*3/uL (ref 0.0–0.7)
Eosinophils Relative: 0 % (ref 0–5)
HCT: 39 % (ref 39.0–52.0)
Hemoglobin: 13.2 g/dL (ref 13.0–17.0)
Lymphs Abs: 0.7 10*3/uL (ref 0.7–4.0)
MCH: 32.7 pg (ref 26.0–34.0)
MCH: 32.2 pg (ref 26.0–34.0)
MCHC: 32.9 g/dL (ref 30.0–36.0)
MCHC: 33.8 g/dL (ref 30.0–36.0)
MCV: 95.1 fL (ref 78.0–100.0)
Monocytes Absolute: 0.9 10*3/uL (ref 0.1–1.0)
Monocytes Relative: 7 % (ref 3–12)
Neutro Abs: 11.3 10*3/uL — ABNORMAL HIGH (ref 1.7–7.7)
Neutrophils Relative %: 84 % — ABNORMAL HIGH (ref 43–77)
Platelets: 78 10*3/uL — ABNORMAL LOW (ref 150–400)
RBC: 3.7 MIL/uL — ABNORMAL LOW (ref 4.22–5.81)
RDW: 16 % — ABNORMAL HIGH (ref 11.5–15.5)

## 2013-02-13 LAB — POCT I-STAT 3, ART BLOOD GAS (G3+)
Acid-base deficit: 5 mmol/L — ABNORMAL HIGH (ref 0.0–2.0)
Bicarbonate: 20.6 mEq/L (ref 20.0–24.0)
O2 Saturation: 95 %
Patient temperature: 98.6
pO2, Arterial: 83 mmHg (ref 80.0–100.0)

## 2013-02-13 LAB — PREPARE RBC (CROSSMATCH)

## 2013-02-13 LAB — AMYLASE: Amylase: 248 U/L — ABNORMAL HIGH (ref 0–105)

## 2013-02-13 LAB — PROTIME-INR
INR: 1.47 (ref 0.00–1.49)
Prothrombin Time: 17.4 seconds — ABNORMAL HIGH (ref 11.6–15.2)

## 2013-02-13 LAB — CORTISOL: Cortisol, Plasma: 21.7 ug/dL

## 2013-02-13 LAB — CBC
Hemoglobin: 14.9 g/dL (ref 13.0–17.0)
MCHC: 34.9 g/dL (ref 30.0–36.0)

## 2013-02-13 LAB — PRO B NATRIURETIC PEPTIDE: Pro B Natriuretic peptide (BNP): 1218 pg/mL — ABNORMAL HIGH (ref 0–125)

## 2013-02-13 LAB — POCT I-STAT, CHEM 8
Calcium, Ion: 1.05 mmol/L — ABNORMAL LOW (ref 1.13–1.30)
Chloride: 103 mEq/L (ref 96–112)
HCT: 38 % — ABNORMAL LOW (ref 39.0–52.0)
Hemoglobin: 12.9 g/dL — ABNORMAL LOW (ref 13.0–17.0)

## 2013-02-13 LAB — GLUCOSE, CAPILLARY: Glucose-Capillary: 136 mg/dL — ABNORMAL HIGH (ref 70–99)

## 2013-02-13 LAB — TROPONIN I: Troponin I: <0.3 ng/mL (ref <0.30)

## 2013-02-13 LAB — PHENYTOIN LEVEL, TOTAL: Phenytoin Lvl: <2.5 ug/mL — ABNORMAL LOW (ref 10.0–20.0)

## 2013-02-13 MED ORDER — VANCOMYCIN HCL 10 G IV SOLR
1250.0000 mg | Freq: Once | INTRAVENOUS | Status: AC
  Administered 2013-02-13: 1250 mg via INTRAVENOUS
  Filled 2013-02-13: qty 1250

## 2013-02-13 MED ORDER — NOREPINEPHRINE BITARTRATE 1 MG/ML IJ SOLN
2.0000 ug/min | INTRAMUSCULAR | Status: DC
  Administered 2013-02-13: 35 ug/min via INTRAVENOUS
  Administered 2013-02-14: 10 ug/min via INTRAVENOUS
  Administered 2013-02-14: 35 ug/min via INTRAVENOUS
  Filled 2013-02-13 (×3): qty 16

## 2013-02-13 MED ORDER — SODIUM CHLORIDE 0.9 % IV BOLUS (SEPSIS)
500.0000 mL | Freq: Once | INTRAVENOUS | Status: AC
  Administered 2013-02-13: 500 mL via INTRAVENOUS

## 2013-02-13 MED ORDER — SODIUM CHLORIDE 0.9 % IV BOLUS (SEPSIS)
1000.0000 mL | Freq: Once | INTRAVENOUS | Status: AC
  Administered 2013-02-13: 1000 mL via INTRAVENOUS

## 2013-02-13 MED ORDER — VANCOMYCIN HCL IN DEXTROSE 1-5 GM/200ML-% IV SOLN
1000.0000 mg | Freq: Once | INTRAVENOUS | Status: DC
  Filled 2013-02-13: qty 200

## 2013-02-13 MED ORDER — NOREPINEPHRINE BITARTRATE 1 MG/ML IJ SOLN
2.0000 ug/min | INTRAVENOUS | Status: DC
  Administered 2013-02-13: 30 ug/min via INTRAVENOUS
  Filled 2013-02-13: qty 16

## 2013-02-13 MED ORDER — PHENYTOIN SODIUM 50 MG/ML IJ SOLN
100.0000 mg | Freq: Two times a day (BID) | INTRAMUSCULAR | Status: DC
  Administered 2013-02-13 – 2013-02-17 (×9): 100 mg via INTRAVENOUS
  Filled 2013-02-13 (×10): qty 2

## 2013-02-13 MED ORDER — PIPERACILLIN-TAZOBACTAM IN DEX 2-0.25 GM/50ML IV SOLN
2.2500 g | Freq: Three times a day (TID) | INTRAVENOUS | Status: DC
  Administered 2013-02-13 – 2013-02-17 (×11): 2.25 g via INTRAVENOUS
  Filled 2013-02-13 (×14): qty 50

## 2013-02-13 MED ORDER — OCTREOTIDE LOAD VIA INFUSION
50.0000 ug | Freq: Once | INTRAVENOUS | Status: AC
  Administered 2013-02-13: 50 ug via INTRAVENOUS
  Filled 2013-02-13: qty 25

## 2013-02-13 MED ORDER — PANTOPRAZOLE SODIUM 40 MG IV SOLR
40.0000 mg | Freq: Once | INTRAVENOUS | Status: AC
  Administered 2013-02-13: 40 mg via INTRAVENOUS
  Filled 2013-02-13: qty 40

## 2013-02-13 MED ORDER — INSULIN ASPART 100 UNIT/ML ~~LOC~~ SOLN
1.0000 [IU] | SUBCUTANEOUS | Status: DC
  Administered 2013-02-13 – 2013-02-17 (×9): 1 [IU] via SUBCUTANEOUS

## 2013-02-13 MED ORDER — FUROSEMIDE 10 MG/ML IJ SOLN
20.0000 mg | Freq: Once | INTRAMUSCULAR | Status: AC
  Administered 2013-02-13: 20 mg via INTRAVENOUS
  Filled 2013-02-13: qty 2

## 2013-02-13 MED ORDER — NOREPINEPHRINE BITARTRATE 1 MG/ML IJ SOLN
2.0000 ug/min | INTRAVENOUS | Status: DC
  Administered 2013-02-13: 2 ug/min via INTRAVENOUS
  Filled 2013-02-13 (×2): qty 4

## 2013-02-13 MED ORDER — DEXTROSE 50 % IV SOLN
25.0000 mL | Freq: Once | INTRAVENOUS | Status: AC
  Administered 2013-02-13: 05:00:00 via INTRAVENOUS
  Filled 2013-02-13: qty 50

## 2013-02-13 MED ORDER — SODIUM CHLORIDE 0.9 % IV SOLN: 250.0000 mL | INTRAVENOUS | Status: DC | PRN

## 2013-02-13 MED ORDER — VANCOMYCIN HCL 500 MG IV SOLR: 500.0000 mg | INTRAVENOUS | Status: DC

## 2013-02-13 MED ORDER — SODIUM CHLORIDE 0.9 % IV SOLN
8.0000 mg/h | INTRAVENOUS | Status: DC
  Administered 2013-02-13 – 2013-02-14 (×3): 8 mg/h via INTRAVENOUS
  Filled 2013-02-13 (×7): qty 80

## 2013-02-13 MED ORDER — VANCOMYCIN HCL 500 MG IV SOLR
500.0000 mg | INTRAVENOUS | Status: DC
  Administered 2013-02-15 – 2013-02-16 (×2): 500 mg via INTRAVENOUS
  Filled 2013-02-13 (×3): qty 500

## 2013-02-13 MED ORDER — PIPERACILLIN-TAZOBACTAM IN DEX 2-0.25 GM/50ML IV SOLN
2.2500 g | Freq: Once | INTRAVENOUS | Status: AC
  Administered 2013-02-13: 2.25 g via INTRAVENOUS
  Filled 2013-02-13: qty 50

## 2013-02-13 MED ORDER — SODIUM CHLORIDE 0.9 % IV SOLN
25.0000 ug/h | INTRAVENOUS | Status: DC
  Administered 2013-02-13 – 2013-02-14 (×2): 25 ug/h via INTRAVENOUS
  Filled 2013-02-13 (×2): qty 1

## 2013-02-13 NOTE — Progress Notes (Signed)
Patient ID: [name], male   DOB: September 14, 1951, 62 y.o.   MRN: [id number]   Reassessed in ICU and no rectal bleeding or hematemesis. SBPs in 60's - low 100's on Levophed. Patient awake on nasal cannula. Will hold off on EGD at this time and recommend continued volume resuscitation. If EGD deemed necessary, then he will likely need to be intubated prior to the procedure for airway protection. Continue Octreotide drip and Protonix drip.

## 2013-02-13 NOTE — H&P (Signed)
PULMONARY  / CRITICAL CARE MEDICINE  Name: [name] MRN: [id number] DOB: 27-Jul-1951    ADMISSION DATE:  02/13/2013  REFERRING MD :  EDP PRIMARY SERVICE: PCCM  CHIEF COMPLAINT:  GI bleeding and hypotension  BRIEF PATIENT DESCRIPTION: This is a 62 year old African American male with end stage renal disease. Previous history of portal hypertension and liver disease and possible variceal bleed along with gastric ulcer. Patient was found with coffee ground emesis and blood in stool but hemoglobin was only 12 on arrival. The patient has been hypotensive and has elevated lactic acid on arrival. Patient was just seen last week for an AV graft placement. Patient's last dialysis session was 2 days ago. The patient has a long-standing history of mental retardation and is mute.  The patient now remained severely hypotensive and is on vasopressors at this time. There is a significant metabolic acidosis as well. The level of the hemoglobin has been questioned. The patient has received one unit of blood thus far.  SIGNIFICANT EVENTS / STUDIES:  02/13/2013 admission in the emergency room with hypotension  LINES / TUBES: Right femoral HD catheter 2 EJ line  CULTURES: Blood cultures x2 02/13/2013  ANTIBIOTICS: None  HISTORY OF PRESENT ILLNESS:  62 year old African American male status post recent AV fistula on left arm. Patient presents with severe lactic acidosis and hypotension. There is a question of coffee-ground emesis and blood in the home. Lactic acid level was elevated. The AV fistula does not appear to be working at this time. There is a right HD catheter in the femoral site. The patient's last hemodialysis session was 2 days ago. The patient has end-stage renal disease. Past history also significant for superior vena cava syndrome, gastric ulcer H. pylori+2009, seizure disorder, and hyperlipidemia.  PAST MEDICAL HISTORY :  Past Medical History  Diagnosis Date  . Upper respiratory  infection 12/12/2009  . Heart murmur, systolic 08/10/2009  . 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 Hypotension 05/26/2009  . Syncope 05/07/2009  . 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 Esophageal varices 11/07/2008  . Redness or discharge of eye 11/06/2008  . Skin tag 08/25/2008  . Gastric ulcer 10/09    antral, with h pylori positive  . Abdominal pain, acute, generalized 06/28/2008  . Congestive heart failure 03/06/2008  . Cellulitis and abscess of leg, except foot 03/06/2008  . Sinus tachycardia 03/06/2008  . Edema 03/06/2008  . Impacted cerumen of both ears 03/06/2008  . Secondary hyperparathyroidism 02/02/2008  . Mute 02/02/2008  . Mental retardation 02/02/2008  . Hyperlipidemia 02/02/2008  . Anemia 02/02/2008  . ESRD (end stage renal disease)     TTS hemodialysis  . GERD (gastroesophageal reflux disease)   . Hypertension 02/02/2008    in history  . Seizures     "non in a while at home"   Past Surgical History  Procedure Laterality Date  . Left forearm graft      for HD  . Arteriovenous graft placement  11/22/10    Right thigh AVG  . Thrombectomy and revision of arterioventous (av) goretex  graft    . Thrombectomy and revision of arterioventous (av) goretex  graft  10/22/2012    Procedure: THROMBECTOMY AND REVISION OF ARTERIOVENTOUS (AV) GORETEX  GRAFT;  Surgeon: Larina Earthly, MD;  Location: [facility] OR;  Service: Vascular;  Laterality: Right;  . Thrombectomy w/ embolectomy  11/10/2012    Procedure: THROMBECTOMY ARTERIOVENOUS GORE-TEX GRAFT;  Surgeon: Pryor Ochoa, MD;  Location: [facility] OR;  Service: Vascular;  Laterality: Right;  . Thrombectomy and revision of arterioventous (av) goretex  graft Right 12/08/2012    Procedure: THROMBECTOMY AND REVISION OF ARTERIOVENTOUS (AV) GORETEX  GRAFT right thigh;  Surgeon: Sherren Kerns, MD;  Location: [facility] OR;  Service: Vascular;  Laterality: Right;  Susie Cassette N/A 12/08/2012    Procedure: VENOGRAM;  Surgeon: Sherren Kerns, MD;  Location: [facility] OR;   Service: Vascular;  Laterality: N/A;  Intraoperative Central venogram  . Thrombectomy w/ embolectomy Right 12/12/2012    Procedure: THROMBECTOMY ARTERIOVENOUS GORE-TEX GRAFT;  Surgeon: Nada Libman, MD;  Location: [facility] OR;  Service: Vascular;  Laterality: Right;  . Insertion of dialysis catheter Left 12/14/2012    Procedure: INSERTION OF DIALYSIS CATHETER;  Surgeon: Nada Libman, MD;  Location: [facility] OR;  Service: Vascular;  Laterality: Left;  . Insertion of dialysis catheter Right 01/13/2013    Procedure: INSERTION OF DIALYSIS CATHETER;  Surgeon: Nada Libman, MD;  Location: [facility] OR;  Service: Vascular;  Laterality: Right;  . Removal of a dialysis catheter Left 01/13/2013    Procedure: REMOVAL OF A DIALYSIS CATHETER;  Surgeon: Nada Libman, MD;  Location: [facility] OR;  Service: Vascular;  Laterality: Left;   Prior to Admission medications   Medication Sig Start Date End Date Taking? Authorizing Provider  atorvastatin (LIPITOR) 20 MG tablet Take 20 mg by mouth at bedtime.     Historical Provider, MD  clonazePAM (KLONOPIN) 0.5 MG tablet Take 0.5 tablets (0.25 mg total) by mouth 2 (two) times daily. 11/15/12   Aletta Edouard, MD  folic acid-vitamin b complex-vitamin c-selenium-zinc (DIALYVITE) 3 MG TABS Take 1 tablet by mouth daily. 01/18/13   Ulyess Mort, MD  midodrine (PROAMATINE) 10 MG tablet Take 15 mg by mouth 3 (three) times daily.     Historical Provider, MD  oxyCODONE (ROXICODONE) 5 MG immediate release tablet Take 1 tablet (5 mg total) by mouth every 4 (four) hours as needed for pain. 02/11/13   Regina J Roczniak, PA-C  pantoprazole (PROTONIX) 40 MG tablet Take 40 mg by mouth daily. 07/14/12   Mathis Dad, MD  phenytoin (DILANTIN) 100 MG ER capsule Take 200 mg by mouth at bedtime.    Historical Provider, MD  sevelamer carbonate (RENVELA) 800 MG tablet Take 800 mg by mouth 3 (three) times daily with meals.    Historical Provider, MD   No Known Allergies  FAMILY HISTORY:  No family history on  file. SOCIAL HISTORY:  reports that he has never smoked. He does not have any smokeless tobacco history on file. He reports that he does not drink alcohol or use illicit drugs.  REVIEW OF SYSTEMS:  The patient is unable to provide any history at this time  SUBJECTIVE:  The patient is hypotensive and seen in the emergency room VITAL SIGNS: Temp:  [97 F (36.1 C)-97.5 F (36.4 C)] 97.3 F (36.3 C) (04/27 0735) Pulse Rate:  [80-117] 117 (04/27 0615) Resp:  [17-31] 22 (04/27 0735) BP: (45-64)/(19-42) 57/42 mmHg (04/27 0735) SpO2:  [44 %-100 %] 94 % (04/27 0739) HEMODYNAMICS: Blood pressure in the 60s systolic   VENTILATOR SETTINGS: Nasal cannula oxygen   INTAKE / OUTPUT: Intake/Output     04/26 0701 - 04/27 0700 04/27 0701 - 04/28 0700   Blood 350    Total Intake 350     Net +350            PHYSICAL EXAMINATION: General:  Ill-appearing African American male Neuro:  Unable to speak this  is a chronic problem HEENT:  Bilateral external jugular lines Cardiovascular:  Regular rate and rhythm normal S1-S2 no S3 or S4 Lungs:  Distant breath sounds Abdomen:  Firm abdomen no rebound or guarding Musculoskeletal:  Left AV fistula, no thrill Skin:  Clear  LABS:  Recent Labs Lab 02/11/13 1032 02/13/13 0503 02/13/13 0539 02/13/13 0558 02/13/13 0644  HGB 14.3 12.1* 12.9*  --   --   WBC  --  7.0  --   --   --   PLT  --  78*  --   --   --   NA 143  --  140  --   --   K 4.0  --  4.1  --   --   CL  --   --  103  --   --   GLUCOSE 85  --  198*  --   --   BUN  --   --  27*  --   --   CREATININE  --   --  5.60*  --   --   INR  --  1.47  --   --   --   LATICACIDVEN  --   --   --   --  9.09*  PHART  --   --   --  7.327*  --   PCO2ART  --   --   --  39.3  --   PO2ART  --   --   --  83.0  --     Recent Labs Lab 02/12/13 0749  GLUCAP 118*    CXR: Possible density right base  ASSESSMENT / PLAN:  PULMONARY A: Mild hypoxemia with possible density right base question  aspiration P:   Titrate oxygen  CARDIOVASCULAR A: Shock state with severe lactic acidosis methemoglobin fairly well-preserved despite GI bleeding history P:  Have given 1 unit packed red cells and will hold on further blood until further lab results are determined Vasopressor support We'll place central line  RENAL A:  End-stage renal disease P:   Renal consultation  GASTROINTESTINAL A:  GI bleeding probable upper source P:   GI consultation would likely need intubation if endoscopy is entertained  HEMATOLOGIC A:  History deficiency anemia and now GI bleeding P:  Transfuse as needed  INFECTIOUS A:  Right lower lobe infiltrate possible aspiration P:   Will give Zosyn Follow cultures  ENDOCRINE A:  Hyperglycemia   P:   Hyperglycemia protocol  NEUROLOGIC A:  Chronic history of mental retardation and mute status with history of seizure disorder P:   Monitor mental status  TODAY'S SUMMARY: 62 year old African American male end-stage renal disease now with potential GI bleed like acidosis and shock. With hypotension the AV fistula no longer has flow and will need vascular surgery input will also have GI and renal input  I have personally obtained a history, examined the patient, evaluated laboratory and imaging results, formulated the assessment and plan and placed orders. CRITICAL CARE: The patient is critically ill with multiple organ systems failure and requires high complexity decision making for assessment and support, frequent evaluation and titration of therapies, application of advanced monitoring technologies and extensive interpretation of multiple databases. Critical Care Time devoted to patient care services described in this note is 40 minutes.   Dorcas Carrow Beeper  [phone]  Cell  [phone]  If no response or cell goes to voicemail, call beeper [phone]  Pulmonary and Critical Care Medicine [facility] Pager: [phone])  [phone]  02/13/2013, 7:39 AM

## 2013-02-13 NOTE — ED Notes (Signed)
Patient from home. Hx ESRD, dialysis M-W-F., seizures. Had a new shunt placed left femoral 2 days ago. Has been bed bound since and will not move.   Per wife, patient's baseline patient is ambulatory and nonverbal. Wife states that he started vomiting coffee ground emesis about 1 hour PTA.  Per EMS, patient will withdraw from pain CBG 100 tacycardic 128-130 Responds/withdraws from pain.  18 g LEJ Cold and clammy.  SPO2 80% RA. Placed on 4L.  NS 500 mL

## 2013-02-13 NOTE — Consult Note (Signed)
  Patient was DC'd home yesterday after left thigh graft inserted by Dr. Myra Gianotti on Friday. Patient readmitted during the night with hematemesis and hypotension. Has had no further upper GI blood since in the emergency department. Was called earlier today to evaluate left thigh graft thought to be pulseless with no Doppler flow  Patient is alert and responsive at this time with blood pressure measurements and 60/40 range. Heart rate 80 Left thigh graft with a nicely healing incisions. I am unable to palpate pulse in graft. Doppler flow frequently difficult to hear in the thigh grafts  I think thigh graft is patent but will have Dr. Myra Gianotti followup tomorrow

## 2013-02-13 NOTE — ED Notes (Signed)
CBG 66 

## 2013-02-13 NOTE — ED Notes (Signed)
Family at bedside. Report called to 2100

## 2013-02-13 NOTE — Consult Note (Signed)
[facility] Renal Consultation Note  Indication for Consultation:  Management of ESRD/hemodialysis; anemia, hypertension/volume and secondary hyperparathyroidism  HPI: [name] is a 62 y.o. male, mentally disabled and mute, with ESRD on dialysis on TTS at the [facility] who was admitted 4/25 for placement of a left thigh AV graft for dialysis and discharged yesterday for dialysis at the center, but returned this morning with reported coffee-ground emesis and blood in stool and noted severe hypotension and elevated lactic acid.  He is currently on vasopressors and has received one unit of blood, although hemoglobin has been stable.  He has a history of esophageal varices and gastric ulcers and was seen by Dr. Bosie Clos of GI this morning.    Dialysis Orders (from 12/2012): Center: [facility] on TTS. EDW 56 kg  HD Bath 2K2Ca  Time 3 hrs 45 mins  Heparin 2400 U. Access Right femoral catheter, left thigh AVG (placed 4/25)  BFR 400 DFR A1.5  Hectorol 6 mcg IV/HD  Epogen 0 Units IV/HD  Venofer 0   Profile 4.   Past Medical History  Diagnosis Date  . Upper respiratory infection 12/12/2009  . Heart murmur, systolic 08/10/2009  . 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 Hypotension 05/26/2009  . Syncope 05/07/2009  . 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 Esophageal varices 11/07/2008  . Redness or discharge of eye 11/06/2008  . Skin tag 08/25/2008  . Gastric ulcer 10/09    antral, with h pylori positive  . Abdominal pain, acute, generalized 06/28/2008  . Congestive heart failure 03/06/2008  . Cellulitis and abscess of leg, except foot 03/06/2008  . Sinus tachycardia 03/06/2008  . Edema 03/06/2008  . Impacted cerumen of both ears 03/06/2008  . Secondary hyperparathyroidism 02/02/2008  . Mute 02/02/2008  . Mental retardation 02/02/2008  . Hyperlipidemia 02/02/2008  . Anemia 02/02/2008  . ESRD (end stage renal disease)     TTS hemodialysis  . GERD (gastroesophageal reflux  disease)   . Hypertension 02/02/2008    in history  . Seizures     "non in a while at home"   Past Surgical History  Procedure Laterality Date  . Left forearm graft      for HD  . Arteriovenous graft placement  11/22/10    Right thigh AVG  . Thrombectomy and revision of arterioventous (av) goretex  graft    . Thrombectomy and revision of arterioventous (av) goretex  graft  10/22/2012    Procedure: THROMBECTOMY AND REVISION OF ARTERIOVENTOUS (AV) GORETEX  GRAFT;  Surgeon: Larina Earthly, MD;  Location: [facility] OR;  Service: Vascular;  Laterality: Right;  . Thrombectomy w/ embolectomy  11/10/2012    Procedure: THROMBECTOMY ARTERIOVENOUS GORE-TEX GRAFT;  Surgeon: Pryor Ochoa, MD;  Location: [facility] OR;  Service: Vascular;  Laterality: Right;  . Thrombectomy and revision of arterioventous (av) goretex  graft Right 12/08/2012    Procedure: THROMBECTOMY AND REVISION OF ARTERIOVENTOUS (AV) GORETEX  GRAFT right thigh;  Surgeon: Sherren Kerns, MD;  Location: [facility] OR;  Service: Vascular;  Laterality: Right;  Susie Cassette N/A 12/08/2012    Procedure: VENOGRAM;  Surgeon: Sherren Kerns, MD;  Location: [facility] OR;  Service: Vascular;  Laterality: N/A;  Intraoperative Central venogram  . Thrombectomy w/ embolectomy Right 12/12/2012    Procedure: THROMBECTOMY ARTERIOVENOUS GORE-TEX GRAFT;  Surgeon: Nada Libman, MD;  Location: [facility] OR;  Service: Vascular;  Laterality: Right;  . Insertion of dialysis catheter Left 12/14/2012    Procedure: INSERTION OF DIALYSIS  CATHETER;  Surgeon: Nada Libman, MD;  Location: [facility] OR;  Service: Vascular;  Laterality: Left;  . Insertion of dialysis catheter Right 01/13/2013    Procedure: INSERTION OF DIALYSIS CATHETER;  Surgeon: Nada Libman, MD;  Location: [facility] OR;  Service: Vascular;  Laterality: Right;  . Removal of a dialysis catheter Left 01/13/2013    Procedure: REMOVAL OF A DIALYSIS CATHETER;  Surgeon: Nada Libman, MD;  Location: [facility] OR;  Service: Vascular;  Laterality: Left;    No family history on file.  Social History He is mentally disabled and mute and has lived with his caretaker for 27 years.  He has no history of tobacco, alcohol, or illicit drug use.  No Known Allergies Prior to Admission medications   Medication Sig Start Date End Date Taking? Authorizing Provider  atorvastatin (LIPITOR) 20 MG tablet Take 20 mg by mouth at bedtime.     Historical Provider, MD  clonazePAM (KLONOPIN) 0.5 MG tablet Take 0.5 tablets (0.25 mg total) by mouth 2 (two) times daily. 11/15/12   Aletta Edouard, MD  folic acid-vitamin b complex-vitamin c-selenium-zinc (DIALYVITE) 3 MG TABS Take 1 tablet by mouth daily. 01/18/13   Ulyess Mort, MD  midodrine (PROAMATINE) 10 MG tablet Take 15 mg by mouth 3 (three) times daily.     Historical Provider, MD  oxyCODONE (ROXICODONE) 5 MG immediate release tablet Take 1 tablet (5 mg total) by mouth every 4 (four) hours as needed for pain. 02/11/13   Regina J Roczniak, PA-C  pantoprazole (PROTONIX) 40 MG tablet Take 40 mg by mouth daily. 07/14/12   Mathis Dad, MD  phenytoin (DILANTIN) 100 MG ER capsule Take 200 mg by mouth at bedtime.    Historical Provider, MD  sevelamer carbonate (RENVELA) 800 MG tablet Take 800 mg by mouth 3 (three) times daily with meals.    Historical Provider, MD   Labs:  Results for orders placed during the hospital encounter of 02/13/13 (from the past 48 hour(s))  GLUCOSE, CAPILLARY     Status: Abnormal   Collection Time    02/13/13  4:50 AM      Result Value Range   Glucose-Capillary 66 (*) 70 - 99 mg/dL  CBC WITH DIFFERENTIAL     Status: Abnormal   Collection Time    02/13/13  5:03 AM      Result Value Range   WBC 7.0  4.0 - 10.5 K/uL   RBC 3.70 (*) 4.22 - 5.81 MIL/uL   Hemoglobin 12.1 (*) 13.0 - 17.0 g/dL   HCT 16.1 (*) 09.6 - 04.5 %   MCV 99.5  78.0 - 100.0 fL   MCH 32.7  26.0 - 34.0 pg   MCHC 32.9  30.0 - 36.0 g/dL   RDW 40.9  81.1 - 91.4 %   Platelets 78 (*) 150 - 400 K/uL   Comment: REPEATED TO  VERIFY     PLATELET COUNT CONFIRMED BY SMEAR   Neutrophils Relative 84 (*) 43 - 77 %   Neutro Abs 5.8  1.7 - 7.7 K/uL   Lymphocytes Relative 10 (*) 12 - 46 %   Lymphs Abs 0.7  0.7 - 4.0 K/uL   Monocytes Relative 6  3 - 12 %   Monocytes Absolute 0.4  0.1 - 1.0 K/uL   Eosinophils Relative 0  0 - 5 %   Eosinophils Absolute 0.0  0.0 - 0.7 K/uL   Basophils Relative 0  0 - 1 %   Basophils Absolute 0.0  0.0 - 0.1 K/uL  PROTIME-INR     Status: Abnormal   Collection Time    02/13/13  5:03 AM      Result Value Range   Prothrombin Time 17.4 (*) 11.6 - 15.2 seconds   INR 1.47  0.00 - 1.49  TYPE AND SCREEN     Status: None   Collection Time    02/13/13  5:04 AM      Result Value Range   ABO/RH(D) B POS     Antibody Screen NEG     Sample Expiration 02/16/2013     Unit Number [id number]     Blood Component Type RED CELLS,LR     Unit division 00     Status of Unit ISSUED     Unit tag comment VERBAL ORDERS PER DR PALUMBO     Transfusion Status OK TO TRANSFUSE     Crossmatch Result COMPATIBLE     Unit Number [id number]     Blood Component Type RED CELLS,LR     Unit division 00     Status of Unit ISSUED     Unit tag comment VERBAL ORDERS PER DR PALUMBO     Transfusion Status OK TO TRANSFUSE     Crossmatch Result COMPATIBLE     Unit Number [id number]     Blood Component Type RED CELLS,LR     Unit division 00     Status of Unit ALLOCATED     Transfusion Status OK TO TRANSFUSE     Crossmatch Result Compatible     Unit Number [id number]     Blood Component Type RED CELLS,LR     Unit division 00     Status of Unit ALLOCATED     Transfusion Status OK TO TRANSFUSE     Crossmatch Result Compatible     Unit Number [id number]     Blood Component Type RED CELLS,LR     Unit division 00     Status of Unit ALLOCATED     Transfusion Status OK TO TRANSFUSE     Crossmatch Result Compatible     Unit Number [id number]     Blood Component Type RED CELLS,LR     Unit division 00      Status of Unit ALLOCATED     Transfusion Status OK TO TRANSFUSE     Crossmatch Result Compatible    POCT I-STAT, CHEM 8     Status: Abnormal   Collection Time    02/13/13  5:39 AM      Result Value Range   Sodium 140  135 - 145 mEq/L   Potassium 4.1  3.5 - 5.1 mEq/L   Chloride 103  96 - 112 mEq/L   BUN 27 (*) 6 - 23 mg/dL   Creatinine, Ser 6.06 (*) 0.50 - 1.35 mg/dL   Glucose, Bld 301 (*) 70 - 99 mg/dL   Calcium, Ion 6.01 (*) 1.13 - 1.30 mmol/L   TCO2 23  0 - 100 mmol/L   Hemoglobin 12.9 (*) 13.0 - 17.0 g/dL   HCT 09.3 (*) 23.5 - 57.3 %  POCT I-STAT 3, BLOOD GAS (G3+)     Status: Abnormal   Collection Time    02/13/13  5:58 AM      Result Value Range   pH, Arterial 7.327 (*) 7.350 - 7.450   pCO2 arterial 39.3  35.0 - 45.0 mmHg   pO2, Arterial 83.0  80.0 - 100.0 mmHg   Bicarbonate 20.6  20.0 - 24.0 mEq/L  TCO2 22  0 - 100 mmol/L   O2 Saturation 95.0     Acid-base deficit 5.0 (*) 0.0 - 2.0 mmol/L   Patient temperature 98.6 F     Collection site RADIAL, ALLEN'S TEST ACCEPTABLE     Drawn by Operator     Sample type ARTERIAL    PREPARE RBC (CROSSMATCH)     Status: None   Collection Time    02/13/13  6:00 AM      Result Value Range   Order Confirmation ORDER PROCESSED BY BLOOD BANK    CG4 I-STAT (LACTIC ACID)     Status: Abnormal   Collection Time    02/13/13  6:44 AM      Result Value Range   Lactic Acid, Venous 9.09 (*) 0.5 - 2.2 mmol/L  CBC WITH DIFFERENTIAL     Status: Abnormal   Collection Time    02/13/13  8:02 AM      Result Value Range   WBC 13.3 (*) 4.0 - 10.5 K/uL   RBC 4.10 (*) 4.22 - 5.81 MIL/uL   Hemoglobin 13.2  13.0 - 17.0 g/dL   HCT 16.1  09.6 - 04.5 %   MCV 95.1  78.0 - 100.0 fL   MCH 32.2  26.0 - 34.0 pg   MCHC 33.8  30.0 - 36.0 g/dL   RDW 40.9 (*) 81.1 - 91.4 %   Platelets 76 (*) 150 - 400 K/uL   Comment: CONSISTENT WITH PREVIOUS RESULT   Neutrophils Relative 85 (*) 43 - 77 %   Neutro Abs 11.3 (*) 1.7 - 7.7 K/uL   Lymphocytes Relative 8 (*)  12 - 46 %   Lymphs Abs 1.0  0.7 - 4.0 K/uL   Monocytes Relative 7  3 - 12 %   Monocytes Absolute 0.9  0.1 - 1.0 K/uL   Eosinophils Relative 0  0 - 5 %   Eosinophils Absolute 0.0  0.0 - 0.7 K/uL   Basophils Relative 0  0 - 1 %   Basophils Absolute 0.0  0.0 - 0.1 K/uL  GLUCOSE, CAPILLARY     Status: None   Collection Time    02/13/13  9:11 AM      Result Value Range   Glucose-Capillary 79  70 - 99 mg/dL   Review of systems not obtained due to patient factors.  Physical Exam: Filed Vitals:   02/13/13 0845  BP: 46/30  Pulse:   Temp:   Resp: 14     General appearance: alert, flat affect, nonverbal, in no distress Head: Normocephalic, atraumatic, exothamic Neck: no adenopathy, no carotid bruit, no JVD and supple, symmetrical, trachea midline Resp: clear to auscultation bilaterally Cardio: regular rate and rhythm, S1, S2 normal, no murmur, click, rub or gallop  GI: soft, non-tender; bowel sounds normal; no masses,  no organomegaly Extremities: extremities normal, atraumatic, no cyanosis or edema Dialysis Access: Right femoral catheter, left thigh AVG (placed 4/25)   Assessment/Plan: 1. Hematemesis/hematochezia - reportedly, no occurrence since admission; Hgb stable, but hypotensive; Hx esophageal varices and gastric ulcers; seen by GI, on Octreotide and Protonix drip. 2. Dialysis access - using right femoral catheter, new left thigh AVG placed by Dr. Hart Rochester 4/25; healing well, but currently no bruit, VVS will follow-up tomorrow. 3. ESRD -  HD on TTS @ [facility]; K 4.1, s/p HD yesterday.  Next HD on 4/29, obtain outpatient orders and meds. 4. Hypotension/volume  - SBPs often in 80s during HD, now ranging form 50s to 100, but  accuracy questioned, currently on Levophed; on outpatient Midodrine; generally gains < 2 L between HD.  5. Anemia  - Hgb most recently 13.3, stable, although transfused 1 U PRBCs empirically. 6. Metabolic bone disease -  Recent labs unavailable, but generally  controlled; on Hectorol and binders. 7. Nutrition - Renal diet.  8. Hx seizure activity  Shanea Karney 02/13/2013, 11:02 AM   Attending Nephrologist: Zetta Bills, MD

## 2013-02-13 NOTE — Consult Note (Signed)
Referring Provider: Dr. Terressa Koyanagi Primary Care Physician:  No primary provider on file. Primary Gastroenterologist:  Gentry Fitz  Reason for Consultation:  GI bleed  HPI: [name] is a 62 y.o. male brought in by EMS after having coffee ground emesis (amount and frequency unknown) and hypotensive with BP 57/31. Patient was in hospital this past week for placement of a left thigh AV graft. EGD in 11/2010 for a GI bleed showed maroon blood in the duodenum and black colored fluid in the stomach with NO source identified along with small nonbleeding esophageal varices. Code called during that procedure due to PEA. Reportedly has a history of esophageal varices and gastric ulcers per chart history. Patient is awake but nonverbal and has mental retardation and unable to answer me. No report of melena or hematochezia and no hematemesis seen in the ER. Pt. S/p 1 U PRBC and 2 L of fluid and BPs in the 60's systolic or less. Hgb 12.9. ESRD on dialysis.    Past Medical History  Diagnosis Date  . Upper respiratory infection 12/12/2009  . Heart murmur, systolic 08/10/2009  . 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 Hypotension 05/26/2009  . Syncope 05/07/2009  . 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 Esophageal varices 11/07/2008  . Redness or discharge of eye 11/06/2008  . Skin tag 08/25/2008  . Gastric ulcer 10/09    antral, with h pylori positive  . Abdominal pain, acute, generalized 06/28/2008  . Congestive heart failure 03/06/2008  . Cellulitis and abscess of leg, except foot 03/06/2008  . Sinus tachycardia 03/06/2008  . Edema 03/06/2008  . Impacted cerumen of both ears 03/06/2008  . Secondary hyperparathyroidism 02/02/2008  . Mute 02/02/2008  . Mental retardation 02/02/2008  . Hyperlipidemia 02/02/2008  . Anemia 02/02/2008  . ESRD (end stage renal disease)     TTS hemodialysis  . GERD (gastroesophageal reflux disease)   . Hypertension 02/02/2008    in history  . Seizures     "non in a while  at home"    Past Surgical History  Procedure Laterality Date  . Left forearm graft      for HD  . Arteriovenous graft placement  11/22/10    Right thigh AVG  . Thrombectomy and revision of arterioventous (av) goretex  graft    . Thrombectomy and revision of arterioventous (av) goretex  graft  10/22/2012    Procedure: THROMBECTOMY AND REVISION OF ARTERIOVENTOUS (AV) GORETEX  GRAFT;  Surgeon: Larina Earthly, MD;  Location: [facility] OR;  Service: Vascular;  Laterality: Right;  . Thrombectomy w/ embolectomy  11/10/2012    Procedure: THROMBECTOMY ARTERIOVENOUS GORE-TEX GRAFT;  Surgeon: Pryor Ochoa, MD;  Location: [facility] OR;  Service: Vascular;  Laterality: Right;  . Thrombectomy and revision of arterioventous (av) goretex  graft Right 12/08/2012    Procedure: THROMBECTOMY AND REVISION OF ARTERIOVENTOUS (AV) GORETEX  GRAFT right thigh;  Surgeon: Sherren Kerns, MD;  Location: [facility] OR;  Service: Vascular;  Laterality: Right;  Susie Cassette N/A 12/08/2012    Procedure: VENOGRAM;  Surgeon: Sherren Kerns, MD;  Location: [facility] OR;  Service: Vascular;  Laterality: N/A;  Intraoperative Central venogram  . Thrombectomy w/ embolectomy Right 12/12/2012    Procedure: THROMBECTOMY ARTERIOVENOUS GORE-TEX GRAFT;  Surgeon: Nada Libman, MD;  Location: [facility] OR;  Service: Vascular;  Laterality: Right;  . Insertion of dialysis catheter Left 12/14/2012    Procedure: INSERTION OF DIALYSIS CATHETER;  Surgeon: Nada Libman, MD;  Location: [facility] OR;  Service: Vascular;  Laterality: Left;  . Insertion of dialysis catheter Right 01/13/2013    Procedure: INSERTION OF DIALYSIS CATHETER;  Surgeon: Nada Libman, MD;  Location: [facility] OR;  Service: Vascular;  Laterality: Right;  . Removal of a dialysis catheter Left 01/13/2013    Procedure: REMOVAL OF A DIALYSIS CATHETER;  Surgeon: Nada Libman, MD;  Location: [facility] OR;  Service: Vascular;  Laterality: Left;    Prior to Admission medications   Medication Sig Start Date End Date Taking?  Authorizing Provider  atorvastatin (LIPITOR) 20 MG tablet Take 20 mg by mouth at bedtime.     Historical Provider, MD  clonazePAM (KLONOPIN) 0.5 MG tablet Take 0.5 tablets (0.25 mg total) by mouth 2 (two) times daily. 11/15/12   Aletta Edouard, MD  folic acid-vitamin b complex-vitamin c-selenium-zinc (DIALYVITE) 3 MG TABS Take 1 tablet by mouth daily. 01/18/13   Ulyess Mort, MD  midodrine (PROAMATINE) 10 MG tablet Take 15 mg by mouth 3 (three) times daily.     Historical Provider, MD  oxyCODONE (ROXICODONE) 5 MG immediate release tablet Take 1 tablet (5 mg total) by mouth every 4 (four) hours as needed for pain. 02/11/13   Regina J Roczniak, PA-C  pantoprazole (PROTONIX) 40 MG tablet Take 40 mg by mouth daily. 07/14/12   Mathis Dad, MD  phenytoin (DILANTIN) 100 MG ER capsule Take 200 mg by mouth at bedtime.    Historical Provider, MD  sevelamer carbonate (RENVELA) 800 MG tablet Take 800 mg by mouth 3 (three) times daily with meals.    Historical Provider, MD    Scheduled Meds: . octreotide  50 mcg Intravenous Once   Continuous Infusions: . norepinephrine (LEVOPHED) Adult infusion 4 mcg/min (02/13/13 0737)  . octreotide (SANDOSTATIN) infusion    . pantoprozole (PROTONIX) infusion 8 mg/hr (02/13/13 0553)  . piperacillin-tazobactam (ZOSYN)  IV    . vancomycin     PRN Meds:.  Allergies as of 02/13/2013  . (No Known Allergies)    No family history on file.  History   Social History  . Marital Status: Single    Spouse Name: N/A    Number of Children: N/A  . Years of Education: N/A   Occupational History  . Not on file.   Social History Main Topics  . Smoking status: Never Smoker   . Smokeless tobacco: Not on file  . Alcohol Use: No  . Drug Use: No  . Sexually Active: No   Other Topics Concern  . Not on file   Social History Narrative  . No narrative on file    Review of Systems: All negative except as stated above in HPI.  Physical Exam: Vital signs: Filed Vitals:    02/13/13 0735  BP: 57/42  Pulse:   Temp: 97.3 F (36.3 C)  Resp: 22     General:   Awake, nonverbal, thin,  HEENT: scleral injected, proptosis Lungs:  Coarse breath sounds  Heart:  Regular rate and rhythm; no murmurs, clicks, rubs,  or gallops. Abdomen: soft, NT, ND, +BS  Rectal:  Deferred Ext: no edema  GI:  Lab Results:  Recent Labs  02/11/13 1032 02/13/13 0503 02/13/13 0539  WBC  --  7.0  --   HGB 14.3 12.1* 12.9*  HCT 42.0 36.8* 38.0*  PLT  --  78*  --    BMET  Recent Labs  02/11/13 1032 02/13/13 0539  NA 143 140  K 4.0 4.1  CL  --  103  GLUCOSE 85 198*  BUN  --  27*  CREATININE  --  5.60*   LFT No results found for this basename: PROT, ALBUMIN, AST, ALT, ALKPHOS, BILITOT, BILIDIR, IBILI,  in the last 72 hours PT/INR  Recent Labs  02/13/13 0503  LABPROT 17.4*  INR 1.47     Studies/Results: Dg Chest 1 View  02/13/2013  *RADIOLOGY REPORT*  Clinical Data: Vomiting.  Evaluate for free air.  CHEST - 1 VIEW  Comparison: 01/13/2013  Findings: Normal heart size and pulmonary vascularity.  Shallow inspiration.  There is increased density in the right lung base which could represent focal infiltration although there is an EKG lead in this region and this may represent the adhesive pad.  No other evidence of focal consolidation.  No airspace disease.  No blunting of costophrenic angles.  No pneumothorax.  No free air demonstrated in the abdomen or mediastinum.  There is a central venous catheter tip in the right atrium, placed via inferior approach.  IMPRESSION: Increased density in the right lung base may represent focal infiltration versus artifact from the EKG lead.  Stable appearance of the inferior central venous catheter.  No evidence of free air in the chest or abdomen.   Original Report Authenticated By: Burman Nieves, M.D.     Impression/Plan: 62 yo with ESRD presenting with upper GI bleed manifested as coffee grounds emesis with severe hypotension.  Has had recent BPs prior to this admit in the 60's systolic. Concern for peptic ulcer bleed as source of coffee grounds emesis more so than a variceal bleed but will start Octreotide and continue Protonix drip. Volume resuscitate per CCM. Will need airway protection prior to EGD if that is deemed necessary. For now will recommend aggressive volume resuscitation and follow closely. D/W Dr. Delford Field.    LOS: 0 days   Xuan Mateus C.  02/13/2013, 7:44 AM

## 2013-02-13 NOTE — Consult Note (Signed)
I have personally seen and examined this patient and agree with the assessment/plan as outlined above by Lyles PA. [name] was admitted for GIB (hemodynamically stable) a few hours after DC from the hospital following an uncomplicated overnight stay for Left AVG placement. Plans noted by GI to defer EGD for now given cessation of GI bleed and hematological/hemodynamic stability as he would require intubation for airway protection if EGD is to be done. Currently on PPI/octreotide with h/o varices.  [name] Lorenzen K.,MD 02/13/2013 1:39 PM

## 2013-02-13 NOTE — ED Notes (Signed)
Critical care MD (Dr. Delford Field) at bedside. Verbal order to stop blood transfusion and to obtain a stat CBC.

## 2013-02-13 NOTE — ED Notes (Signed)
GI and critical care MDs at bedside.

## 2013-02-13 NOTE — Progress Notes (Signed)
ANTIBIOTIC CONSULT NOTE - INITIAL  Pharmacy Consult for Vancomycin and Zosyn Indication: rule out pneumonia (possible aspiration)  No Known Allergies  Patient Measurements:   Height = 61 inches Weight = 56 kg  Vital Signs: Temp: 97.3 F (36.3 C) (04/27 0745) Temp src: Rectal (04/27 0456) BP: 46/30 mmHg (04/27 0845) Pulse Rate: 117 (04/27 0615) Intake/Output from previous day: 04/26 0701 - 04/27 0700 In: 350 [Blood:350] Out: -  Intake/Output from this shift:    Labs:  Recent Labs  02/13/13 0503 02/13/13 0539 02/13/13 0802  WBC 7.0  --  13.3*  HGB 12.1* 12.9* 13.2  PLT 78*  --  76*  CREATININE  --  5.60*  --    ESRD with HD TTS   Microbiology: No results found for this or any previous visit (from the past 720 hour(s)).  Medical History: Past Medical History  Diagnosis Date  . Upper respiratory infection 12/12/2009  . Heart murmur, systolic 08/10/2009  . 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 Hypotension 05/26/2009  . Syncope 05/07/2009  . 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 Esophageal varices 11/07/2008  . Redness or discharge of eye 11/06/2008  . Skin tag 08/25/2008  . Gastric ulcer 10/09    antral, with h pylori positive  . Abdominal pain, acute, generalized 06/28/2008  . Congestive heart failure 03/06/2008  . Cellulitis and abscess of leg, except foot 03/06/2008  . Sinus tachycardia 03/06/2008  . Edema 03/06/2008  . Impacted cerumen of both ears 03/06/2008  . Secondary hyperparathyroidism 02/02/2008  . Mute 02/02/2008  . Mental retardation 02/02/2008  . Hyperlipidemia 02/02/2008  . Anemia 02/02/2008  . ESRD (end stage renal disease)     TTS hemodialysis  . GERD (gastroesophageal reflux disease)   . Hypertension 02/02/2008    in history  . Seizures     "non in a while at home"    Medications:  Prescriptions prior to admission  Medication Sig Dispense Refill  . atorvastatin (LIPITOR) 20 MG tablet Take 20 mg by mouth at bedtime.       . clonazePAM  (KLONOPIN) 0.5 MG tablet Take 0.5 tablets (0.25 mg total) by mouth 2 (two) times daily.  30 tablet  3  . folic acid-vitamin b complex-vitamin c-selenium-zinc (DIALYVITE) 3 MG TABS Take 1 tablet by mouth daily.  30 tablet  11  . midodrine (PROAMATINE) 10 MG tablet Take 15 mg by mouth 3 (three) times daily.       Marland Kitchen oxyCODONE (ROXICODONE) 5 MG immediate release tablet Take 1 tablet (5 mg total) by mouth every 4 (four) hours as needed for pain.  30 tablet  0  . pantoprazole (PROTONIX) 40 MG tablet Take 40 mg by mouth daily.      . phenytoin (DILANTIN) 100 MG ER capsule Take 200 mg by mouth at bedtime.      . sevelamer carbonate (RENVELA) 800 MG tablet Take 800 mg by mouth 3 (three) times daily with meals.       Assessment: 62 yo M presents to [facility] ED with hypotension and GIB (coffee ground emesis and blood in stool).  Pt has PMH significant for ESRD with HD TTS, portal hypertension, liver disease, and possible variceal bleed/gastric ulcer.  Pt is hypotensive requiring pressure support and has elevated lactic acid.  In ED, CXR concerning for PNA, possible aspiration.  To start Vancomycin and Zosyn.  Pt received Zosyn 2.25 gm IV x 1 dose at 1000.  Goal of Therapy:  Pre-HD Vancomycin level 15-25 mcg/ml  Plan:  Pt has not received Vancomycin 1gm that was ordered in the ED.  Will change dose to 1250 mg for adequate loading dose.  Followed by Vancomycin 500mg  after each HD - MWF.  Zosyn 2.25gm IV q8h. Follow-up culture data, clinical progress, and HD days in the event that medications need to be rescheduled.  [employer], Pharm.D., BCPS Clinical Pharmacist Pager [phone] 02/13/2013 10:22 AM

## 2013-02-13 NOTE — ED Provider Notes (Signed)
History     CSN: [id number]  Arrival date & time 02/13/13  7829   First MD Initiated Contact with Patient 02/13/13 [phone]      Chief Complaint  Patient presents with  . Emesis    (Consider location/radiation/quality/duration/timing/severity/associated sxs/prior treatment) Patient is a 62 y.o. male presenting with vomiting. The history is provided by the EMS personnel. The history is limited by the condition of the patient.  Emesis Severity:  Severe Timing:  Constant Emesis appearance: COFFEE GROUNDS. Progression:  Unchanged Relieved by:  Nothing Worsened by:  Nothing tried Ineffective treatments:  None tried   Past Medical History  Diagnosis Date  . Upper respiratory infection 12/12/2009  . Heart murmur, systolic 08/10/2009  . 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 Hypotension 05/26/2009  . Syncope 05/07/2009  . 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 Esophageal varices 11/07/2008  . Redness or discharge of eye 11/06/2008  . Skin tag 08/25/2008  . Gastric ulcer 10/09    antral, with h pylori positive  . Abdominal pain, acute, generalized 06/28/2008  . Congestive heart failure 03/06/2008  . Cellulitis and abscess of leg, except foot 03/06/2008  . Sinus tachycardia 03/06/2008  . Edema 03/06/2008  . Impacted cerumen of both ears 03/06/2008  . Secondary hyperparathyroidism 02/02/2008  . Mute 02/02/2008  . Mental retardation 02/02/2008  . Hyperlipidemia 02/02/2008  . Anemia 02/02/2008  . ESRD (end stage renal disease)     TTS hemodialysis  . GERD (gastroesophageal reflux disease)   . Hypertension 02/02/2008    in history  . Seizures     "non in a while at home"    Past Surgical History  Procedure Laterality Date  . Left forearm graft      for HD  . Arteriovenous graft placement  11/22/10    Right thigh AVG  . Thrombectomy and revision of arterioventous (av) goretex  graft    . Thrombectomy and revision of arterioventous (av) goretex  graft  10/22/2012    Procedure:  THROMBECTOMY AND REVISION OF ARTERIOVENTOUS (AV) GORETEX  GRAFT;  Surgeon: Larina Earthly, MD;  Location: [facility] OR;  Service: Vascular;  Laterality: Right;  . Thrombectomy w/ embolectomy  11/10/2012    Procedure: THROMBECTOMY ARTERIOVENOUS GORE-TEX GRAFT;  Surgeon: Pryor Ochoa, MD;  Location: [facility] OR;  Service: Vascular;  Laterality: Right;  . Thrombectomy and revision of arterioventous (av) goretex  graft Right 12/08/2012    Procedure: THROMBECTOMY AND REVISION OF ARTERIOVENTOUS (AV) GORETEX  GRAFT right thigh;  Surgeon: Sherren Kerns, MD;  Location: [facility] OR;  Service: Vascular;  Laterality: Right;  Susie Cassette N/A 12/08/2012    Procedure: VENOGRAM;  Surgeon: Sherren Kerns, MD;  Location: [facility] OR;  Service: Vascular;  Laterality: N/A;  Intraoperative Central venogram  . Thrombectomy w/ embolectomy Right 12/12/2012    Procedure: THROMBECTOMY ARTERIOVENOUS GORE-TEX GRAFT;  Surgeon: Nada Libman, MD;  Location: [facility] OR;  Service: Vascular;  Laterality: Right;  . Insertion of dialysis catheter Left 12/14/2012    Procedure: INSERTION OF DIALYSIS CATHETER;  Surgeon: Nada Libman, MD;  Location: [facility] OR;  Service: Vascular;  Laterality: Left;  . Insertion of dialysis catheter Right 01/13/2013    Procedure: INSERTION OF DIALYSIS CATHETER;  Surgeon: Nada Libman, MD;  Location: [facility] OR;  Service: Vascular;  Laterality: Right;  . Removal of a dialysis catheter Left 01/13/2013    Procedure: REMOVAL OF A DIALYSIS CATHETER;  Surgeon: Nada Libman, MD;  Location: [facility] OR;  Service: Vascular;  Laterality:  Left;    No family history on file.  History  Substance Use Topics  . Smoking status: Never Smoker   . Smokeless tobacco: Not on file  . Alcohol Use: No      Review of Systems  Unable to perform ROS Gastrointestinal: Positive for vomiting.    Allergies  Review of patient's allergies indicates no known allergies.  Home Medications   Current Outpatient Rx  Name  Route  Sig  Dispense  Refill  .  atorvastatin (LIPITOR) 20 MG tablet   Oral   Take 20 mg by mouth at bedtime.          . clonazePAM (KLONOPIN) 0.5 MG tablet   Oral   Take 0.5 tablets (0.25 mg total) by mouth 2 (two) times daily.   30 tablet   3   . folic acid-vitamin b complex-vitamin c-selenium-zinc (DIALYVITE) 3 MG TABS   Oral   Take 1 tablet by mouth daily.   30 tablet   11   . midodrine (PROAMATINE) 10 MG tablet   Oral   Take 15 mg by mouth 3 (three) times daily.          Marland Kitchen oxyCODONE (ROXICODONE) 5 MG immediate release tablet   Oral   Take 1 tablet (5 mg total) by mouth every 4 (four) hours as needed for pain.   30 tablet   0   . pantoprazole (PROTONIX) 40 MG tablet   Oral   Take 40 mg by mouth daily.         . phenytoin (DILANTIN) 100 MG ER capsule   Oral   Take 200 mg by mouth at bedtime.         . sevelamer carbonate (RENVELA) 800 MG tablet   Oral   Take 800 mg by mouth 3 (three) times daily with meals.           BP 57/31  Temp(Src) 97.4 F (36.3 C) (Rectal)  Resp 22  Physical Exam  Constitutional: He appears well-developed and well-nourished.  HENT:  Head: Normocephalic and atraumatic.  Mouth/Throat: Oropharynx is clear and moist.  Eyes: Conjunctivae are normal. Pupils are equal, round, and reactive to light.  Neck: Normal range of motion. Neck supple.  Cardiovascular: Tachycardia present.   Pulmonary/Chest: No stridor. He has decreased breath sounds.  Abdominal: Soft. Bowel sounds are decreased. There is no rebound and no guarding.  Genitourinary: Guaiac positive stool.  Grossly bloody  Musculoskeletal: Normal range of motion. He exhibits no edema.  Neurological: He is alert.  Skin: Skin is warm and dry. He is not diaphoretic.  Psychiatric: He has a normal mood and affect.    ED Course  Procedures (including critical care time)  Labs Reviewed  CBC WITH DIFFERENTIAL - Abnormal; Notable for the following:    RBC 3.70 (*)    Hemoglobin 12.1 (*)    HCT 36.8 (*)     Neutrophils Relative 84 (*)    Lymphocytes Relative 10 (*)    All other components within normal limits  PROTIME-INR - Abnormal; Notable for the following:    Prothrombin Time 17.4 (*)    All other components within normal limits  POCT I-STAT, CHEM 8 - Abnormal; Notable for the following:    BUN 27 (*)    Creatinine, Ser 5.60 (*)    Glucose, Bld 198 (*)    Calcium, Ion 1.05 (*)    Hemoglobin 12.9 (*)    HCT 38.0 (*)    All other components within  normal limits  POCT I-STAT 3, BLOOD GAS (G3+) - Abnormal; Notable for the following:    pH, Arterial 7.327 (*)    Acid-base deficit 5.0 (*)    All other components within normal limits  TYPE AND SCREEN  PREPARE RBC (CROSSMATCH)   Dg Chest 1 View  02/13/2013  *RADIOLOGY REPORT*  Clinical Data: Vomiting.  Evaluate for free air.  CHEST - 1 VIEW  Comparison: 01/13/2013  Findings: Normal heart size and pulmonary vascularity.  Shallow inspiration.  There is increased density in the right lung base which could represent focal infiltration although there is an EKG lead in this region and this may represent the adhesive pad.  No other evidence of focal consolidation.  No airspace disease.  No blunting of costophrenic angles.  No pneumothorax.  No free air demonstrated in the abdomen or mediastinum.  There is a central venous catheter tip in the right atrium, placed via inferior approach.  IMPRESSION: Increased density in the right lung base may represent focal infiltration versus artifact from the EKG lead.  Stable appearance of the inferior central venous catheter.  No evidence of free air in the chest or abdomen.   Original Report Authenticated By: Burman Nieves, M.D.      No diagnosis found.    MDM  Gi BLEED LOSS OF THRILL OF L GROIN CATHETER     Medications  pantoprazole (PROTONIX) 80 mg in sodium chloride 0.9 % 250 mL infusion (8 mg/hr Intravenous New Bag/Given 02/13/13 0553)  norepinephrine (LEVOPHED) 4 mg in dextrose 5 % 250 mL infusion  (not administered)  dextrose 50 % solution 25 mL ( Intravenous Given 02/13/13 0501)  sodium chloride 0.9 % bolus 500 mL (0 mLs Intravenous Stopped 02/13/13 0554)  pantoprazole (PROTONIX) injection 40 mg (40 mg Intravenous Given 02/13/13 0501)  furosemide (LASIX) injection 20 mg (20 mg Intravenous Given 02/13/13 0559)  sodium chloride 0.9 % bolus 1,000 mL (1,000 mLs Intravenous New Bag/Given 02/13/13 9147)   Medications  pantoprazole (PROTONIX) 80 mg in sodium chloride 0.9 % 250 mL infusion (8 mg/hr Intravenous New Bag/Given 02/13/13 0553)  norepinephrine (LEVOPHED) 4 mg in dextrose 5 % 250 mL infusion (4 mcg/min Intravenous Rate/Dose Change 02/13/13 0737)  vancomycin (VANCOCIN) IVPB 1000 mg/200 mL premix (not administered)  piperacillin-tazobactam (ZOSYN) IVPB 2.25 g (not administered)  octreotide (SANDOSTATIN) 2 mcg/mL load via infusion 50 mcg (not administered)  octreotide (SANDOSTATIN) 2 mcg/mL in sodium chloride 0.9 % 250 mL infusion (not administered)  dextrose 50 % solution 25 mL ( Intravenous Given 02/13/13 0501)  sodium chloride 0.9 % bolus 500 mL (0 mLs Intravenous Stopped 02/13/13 0554)  pantoprazole (PROTONIX) injection 40 mg (40 mg Intravenous Given 02/13/13 0501)  furosemide (LASIX) injection 20 mg (20 mg Intravenous Given 02/13/13 0559)  sodium chloride 0.9 % bolus 1,000 mL (0 mLs Intravenous Stopped 02/13/13 0720)    MDM Reviewed: previous chart, nursing note and vitals Interpretation: labs, ECG and x-ray Total time providing critical care: 75-105 minutes. This excludes time spent performing separately reportable procedures and services. Consults: CRITICAL CARE, VASCULAR SURGERY, gi [facility] OK TO ACCESS CATHETER.  CRITICAL CARE Performed by: Jasmine Awe   Total critical care time: 90 minutes  Critical care time was exclusive of separately billable procedures and treating other patients.  Critical care was necessary to treat or prevent imminent or  life-threatening deterioration.  Critical care was time spent personally by me on the following activities: development of treatment plan with patient and/or surrogate as well as  nursing, discussions with consultants, evaluation of patient's response to treatment, examination of patient, obtaining history from patient or surrogate, ordering and performing treatments and interventions, ordering and review of laboratory studies, ordering and review of radiographic studies, pulse oximetry and re-evaluation of patient's condition.     Dr. Darrick Penna states via phone can use catheter for vascular access    Date: 02/13/2013  Rate: 124  Rhythm: sinus tachycardia  QRS Axis: normal  Intervals: normal  ST/T Wave abnormalities: normal  Conduction Disutrbances:none  Narrative Interpretation:   Old EKG Reviewed: none available    Kwamaine Cuppett K Amaris Delafuente-Rasch, MD 02/13/13 0745

## 2013-02-13 NOTE — Progress Notes (Signed)
Utilization review completed.  

## 2013-02-13 NOTE — Discharge Summary (Signed)
Agree with the above  [name] 

## 2013-02-14 ENCOUNTER — Encounter (HOSPITAL_COMMUNITY): Payer: Self-pay | Admitting: Surgery

## 2013-02-14 ENCOUNTER — Inpatient Hospital Stay (HOSPITAL_COMMUNITY): Payer: Medicare Other

## 2013-02-14 DIAGNOSIS — K922 Gastrointestinal hemorrhage, unspecified: Principal | ICD-10-CM

## 2013-02-14 DIAGNOSIS — N186 End stage renal disease: Secondary | ICD-10-CM

## 2013-02-14 DIAGNOSIS — I959 Hypotension, unspecified: Secondary | ICD-10-CM

## 2013-02-14 LAB — CBC
HCT: 40.3 % (ref 39.0–52.0)
Hemoglobin: 13.7 g/dL (ref 13.0–17.0)
MCV: 92.6 fL (ref 78.0–100.0)
RBC: 4.35 MIL/uL (ref 4.22–5.81)
WBC: 9.2 10*3/uL (ref 4.0–10.5)

## 2013-02-14 LAB — TYPE AND SCREEN
Unit division: 0
Unit division: 0

## 2013-02-14 LAB — BASIC METABOLIC PANEL
CO2: 23 mEq/L (ref 19–32)
Chloride: 98 mEq/L (ref 96–112)
Creatinine, Ser: 8.3 mg/dL — ABNORMAL HIGH (ref 0.50–1.35)
Sodium: 136 mEq/L (ref 135–145)

## 2013-02-14 LAB — OCCULT BLOOD, POC DEVICE: Fecal Occult Bld: POSITIVE — AB

## 2013-02-14 LAB — GLUCOSE, CAPILLARY
Glucose-Capillary: 124 mg/dL — ABNORMAL HIGH (ref 70–99)
Glucose-Capillary: 108 mg/dL — ABNORMAL HIGH (ref 70–99)

## 2013-02-14 MED ORDER — BIOTENE DRY MOUTH MT LIQD
15.0000 mL | Freq: Two times a day (BID) | OROMUCOSAL | Status: DC
  Administered 2013-02-14 – 2013-02-19 (×10): 15 mL via OROMUCOSAL

## 2013-02-14 MED ORDER — HEPARIN SODIUM (PORCINE) 1000 UNIT/ML IJ SOLN
INTRAMUSCULAR | Status: AC
  Administered 2013-02-14: 3400 [IU]
  Filled 2013-02-14: qty 2

## 2013-02-14 MED ORDER — PANTOPRAZOLE SODIUM 40 MG IV SOLR
40.0000 mg | Freq: Two times a day (BID) | INTRAVENOUS | Status: DC
  Administered 2013-02-14 – 2013-02-17 (×7): 40 mg via INTRAVENOUS
  Filled 2013-02-14 (×8): qty 40

## 2013-02-14 MED ORDER — SODIUM CHLORIDE 0.9 % IV BOLUS (SEPSIS)
1000.0000 mL | Freq: Once | INTRAVENOUS | Status: AC
  Administered 2013-02-14: 1000 mL via INTRAVENOUS

## 2013-02-14 NOTE — Care Management Note (Signed)
    Page 1 of 1   02/14/2013     2:04:33 PM   CARE MANAGEMENT NOTE 02/14/2013  Patient:  [name], [name]   Account Number:  [id number]  Date Initiated:  02/14/2013  Documentation initiated by:  [facility]  Subjective/Objective Assessment:   Admitted with coffee ground emesis and hypotension.     Action/Plan:   Anticipated DC Date:  02/18/2013   Anticipated DC Plan:  HOME W HOME HEALTH SERVICES      DC Planning Services  CM consult  CM consult      Choice offered to / List presented to:             Status of service:  In process, will continue to follow Medicare Important Message given?   (If response is "NO", the following Medicare IM given date fields will be blank) Date Medicare IM given:   Date Additional Medicare IM given:    Discharge Disposition:    Per UR Regulation:  Reviewed for med. necessity/level of care/duration of stay  If discussed at Long Length of Stay Meetings, dates discussed:    CommentsMargaretmary Lombard Oldest Sister [phone]   [facility] - Care giver that he lives with - [phone]  02-14-13 2pm Avie Arenas, RNBSN- [phone] Talked with sister, Talbert Forest and caregiver, Jerrye Beavers at bedside.  Patient has w/c and hospital bed at home.  He lives with caregiver and she is with him 24/7.  Indpendent at home mostly - does walk and get around and has a specific schedule. Plan for discharge back home to prior living.  Has transportation arranged to Dialysis center [address].

## 2013-02-14 NOTE — Progress Notes (Signed)
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 pylori+2009, seizure disorder, and hyperlipidemia presents with severe lactic acidosis and hypotension. He reports having coffee ground emesis and blood in stools.   Lines / Drains: Right EJ 4/27>> Right femoral HD catheter Left AV graft 4/25>>  Cultures: Blood cultures 4/27>>  Antibiotics: Vanc 4/27>> Zosyn 4/27>>  Tests / Events: 02/13/2013 admission in the emergency room with hypotension   Overnight Events: No witnessed episodes of bleeding  Vital Signs: Temp:  [97.1 F (36.2 C)-99.2 F (37.3 C)] 97.4 F (36.3 C) (04/28 0417) Pulse Rate:  [70-110] 107 (04/28 0600) Resp:  [0-22] 15 (04/28 0600) BP: (45-95)/(26-63) 92/47 mmHg (04/28 0600) SpO2:  [89 %-99 %] 94 % (04/28 0600) Weight:  [133 lb 6.1 oz (60.5 kg)] 133 lb 6.1 oz (60.5 kg) (04/28 0200) I/O last 3 completed shifts: In: 2836.6 [I.V.:2036.6; Blood:700; IV Piggyback:100] Out: -   Physical Examination: General: Ill-appearing African American male  Neuro: Unable to speak this is a chronic problem  HEENT: Bilateral external jugular lines  Cardiovascular: Regular rate and rhythm normal S1-S2 no S3 or S4  Lungs: Distant breath sounds  Abdomen: Firm abdomen no rebound or guarding  Musculoskeletal: Left AV fistula, no thrill  Skin: Clear   Ventilator settings:    Recent Labs  02/13/13 0539 02/13/13 0558  PHART  --  7.327*  PO2ART  --  83.0  TCO2 23 22  HCO3  --  20.6   Labs and Imaging:   Basic Metabolic Panel:  Recent Labs Lab 02/11/13 1032 02/13/13 0539  NA 143 140  K 4.0 4.1  CL  --  103  GLUCOSE 85 198*  BUN  --  27*  CREATININE  --  5.60*    Recent Labs Lab 02/13/13 1322  AMYLASE 248*   CBC:  Recent  Labs Lab 02/13/13 0503  02/13/13 0802 02/13/13 1322  WBC 7.0  --  13.3* 13.9*  NEUTROABS 5.8  --  11.3*  --   HGB 12.1*  < > 13.2 14.9  HCT 36.8*  < > 39.0 42.7  MCV 99.5  --  95.1 91.8  PLT 78*  --  76* 77*  < > = values in this interval not displayed. Cardiac Enzymes:  Recent Labs Lab 02/13/13 1322 02/13/13 1610 02/13/13 2200  TROPONINI <0.30 <0.30 <0.30   BNP:  Recent Labs Lab 02/13/13 1322  PROBNP 1218.0*   CBG:  Recent Labs Lab 02/13/13 0911 02/13/13 1443 02/13/13 1526 02/13/13 1948 02/14/13 0048 02/14/13 0323  GLUCAP 79 136* 136* 142* 142* 144*   Coagulation:  Recent Labs Lab 02/13/13 0503  LABPROT 17.4*  INR 1.47     Recent Labs Lab 02/13/13 0644 02/13/13 1322  LATICACIDVEN 9.09*  --   PROCALCITON  --  37.80    Assessment and Plan: PULMONARY  ASSESSMENT:Mild hypoxemia   HCAP vs aspiration with possible density right base question aspiration PLAN:   Goal O2>92% Trend CXR See ID for abx  CARDIOVASCULAR  ASSESSMENT: Hypovolemic shock with ? GI bleed vs excess volume removal with HD vs septic shock from HACP  ( ? Aspiration) Lactate- 9.9  PLAN:  S/p 1 unit of PRBC Titrate pressors with MAP goal >50 Recheck lactate  RENAL  ASSESSMENT:  ESRD PLAN:  Renal on board Next HD- 4/29  GASTROINTESTINAL  ASSESSMENT:  GI bleeding probable upper source. History of varices and gastric ulcers . EGD in 11/2010 for a GI bleed showed maroon blood in the duodenum and black colored fluid in the stomach with NO source identified along with small nonbleeding esophageal varices   PLAN:   GI following along! Protonix drip  Octreotide drip   HEMATOLOGIC  ASSESSMENT:  History of anemia. Now presents with ? GI bleed. H and H stable PLAN:  Trend CBC  INFECTIOUS  ASSESSMENT:  HCAP vs aspiration with  Right lower lobe infiltrate  Leucocytosis- stable  PCT - 37.8 PLAN:   Vanc and Zosyn  Follow cultures   ENDOCRINE  ASSESSMENT:   Hyperglycemia  PLAN:   ICU hyperglycemic protocol  NEUROLOGIC  ASSESSMENT:  Chronic history of mental retardation and mute status with history of seizure disorder Phenytoin levels subtherapeutic PLAN:   Monitor mentals status Continue phenytoin for seizures  CLINICAL SUMMARY: 62 y/o gentleman presents with hypotension likely in the setting of GI bleed. H and H stable for now.   Best practices / Disposition: -->ICU status under PCCM -->full code -->Heparin for DVT Px -->Protonix for GI Px -->ventilator bundle -->diet -->family updated at bedside  SAWHNEY,MEGHA 02/14/2013, 7:18 AM  The patient is critically ill with multiple organ systems failure and requires high complexity decision making for assessment and support, frequent evaluation and titration of therapies, application of advanced monitoring technologies and extensive interpretation of multiple databases.   Billy Fischer, MD ; [facility] [phone].  After 5:30 PM or weekends, call [phone]

## 2013-02-14 NOTE — Progress Notes (Addendum)
Subjective:  Pt has become more hemodynamically stable overnight- hgb is dropping but still above 12- no chemistries yet this AM Objective Vital signs in last 24 hours: Filed Vitals:   02/14/13 0400 02/14/13 0417 02/14/13 0500 02/14/13 0600  BP: 92/58  95/45 92/47  Pulse: 90  88 107  Temp:  97.4 F (36.3 C)    TempSrc:  Oral    Resp: 14  14 15   Weight:      SpO2: 99%  97% 94%   Weight change:   Intake/Output Summary (Last 24 hours) at 02/14/13 0744 Last data filed at 02/14/13 0600  Gross per 24 hour  Intake 2486.59 ml  Output      0 ml  Net 2486.59 ml   Labs: Basic Metabolic Panel:  Recent Labs Lab 02/11/13 1032 02/13/13 0539  NA 143 140  K 4.0 4.1  CL  --  103  GLUCOSE 85 198*  BUN  --  27*  CREATININE  --  5.60*   Liver Function Tests: No results found for this basename: AST, ALT, ALKPHOS, BILITOT, PROT, ALBUMIN,  in the last 168 hours  Recent Labs Lab 02/13/13 1322  AMYLASE 248*   No results found for this basename: AMMONIA,  in the last 168 hours CBC:  Recent Labs Lab 02/13/13 0503 02/13/13 0539 02/13/13 0802 02/13/13 1322  WBC 7.0  --  13.3* 13.9*  NEUTROABS 5.8  --  11.3*  --   HGB 12.1* 12.9* 13.2 14.9  HCT 36.8* 38.0* 39.0 42.7  MCV 99.5  --  95.1 91.8  PLT 78*  --  76* 77*   Cardiac Enzymes:  Recent Labs Lab 02/13/13 1322 02/13/13 1610 02/13/13 2200  TROPONINI <0.30 <0.30 <0.30   CBG:  Recent Labs Lab 02/13/13 1443 02/13/13 1526 02/13/13 1948 02/14/13 0048 02/14/13 0323  GLUCAP 136* 136* 142* 142* 144*    Iron Studies: No results found for this basename: IRON, TIBC, TRANSFERRIN, FERRITIN,  in the last 72 hours Studies/Results: Dg Chest 1 View  02/13/2013  *RADIOLOGY REPORT*  Clinical Data: Vomiting.  Evaluate for free air.  CHEST - 1 VIEW  Comparison: 01/13/2013  Findings: Normal heart size and pulmonary vascularity.  Shallow inspiration.  There is increased density in the right lung base which could represent focal  infiltration although there is an EKG lead in this region and this may represent the adhesive pad.  No other evidence of focal consolidation.  No airspace disease.  No blunting of costophrenic angles.  No pneumothorax.  No free air demonstrated in the abdomen or mediastinum.  There is a central venous catheter tip in the right atrium, placed via inferior approach.  IMPRESSION: Increased density in the right lung base may represent focal infiltration versus artifact from the EKG lead.  Stable appearance of the inferior central venous catheter.  No evidence of free air in the chest or abdomen.   Original Report Authenticated By: Burman Nieves, M.D.    Medications: Infusions: . norepinephrine (LEVOPHED) Adult infusion 35 mcg/min (02/14/13 0600)  . octreotide (SANDOSTATIN) infusion 25 mcg/hr (02/14/13 0400)  . pantoprozole (PROTONIX) infusion 8 mg/hr (02/14/13 0425)    Scheduled Medications: . antiseptic oral rinse  15 mL Mouth Rinse BID  . insulin aspart  1-3 Units Subcutaneous Q4H  . phenytoin (DILANTIN) IV  100 mg Intravenous Q12H  . piperacillin-tazobactam (ZOSYN)  IV  2.25 g Intravenous Q8H  . [START ON 02/15/2013] vancomycin  500 mg Intravenous Q T,Th,Sa-HD    have reviewed scheduled  and prn medications.  Physical Exam: General: alert, nonverbal Heart: RRR Lungs: mostly clear Abdomen: soft, non tender Extremities: minimal edema Dialysis Access: femoral PC and new left thigh AVG- good bruit   I Assessment/ Plan: Pt is a 62 y.o. yo male who was admitted on 02/13/2013 with  Acute GIB after placement of thigh AVG on 4/24 Assessment/Plan: 1. GIB- coffee ground emesis with hemodynamic instability- on protonix/octreotide- GI on board- deferring EGD for now due to instability 2. ESRD - s/p HD on 4/26- regular day, normally TTS at [facility].  Will be due next tomorrow.  Labs from today are pending hopefully will not need HD because now is hemodynamically unstable- would need to do CRRT if needs  today. No heparin with HD 3. Anemia- s/p one unit of PRBC- hgb falling but still above 12 4. Secondary hyperparathyroidism- generally controlled as OP, 6 of hectorol will be ordered with HD- binders on hold due to NPO status 5. HTN/volume- good O2 sats on [place] O2- hypotensive- not sure what his baseline BP is   I will follow up on labs today but try to hold off on HD today and reassess in AM.  No need for HD today- Of note, I did ask at his OP unit and they said he usually runs BPs in the 80's and 90's if that influences anything as far as pressor dosing  Pegeen Stiger A   02/14/2013,7:44 AM  LOS: 1 day

## 2013-02-14 NOTE — Consult Note (Signed)
[facility] PROGRESS NOTE Subjective Pt has been stable since yesterday without any sign of active bleeding. Has not received transfusion.  Objective: Vital signs in last 24 hours: Temp:  [97.1 F (36.2 C)-99.2 F (37.3 C)] 97.4 F (36.3 C) (04/28 0417) Pulse Rate:  [70-110] 107 (04/28 0600) Resp:  [0-19] 15 (04/28 0600) BP: (46-95)/(26-63) 92/47 mmHg (04/28 0600) SpO2:  [89 %-99 %] 94 % (04/28 0600) Weight:  [60.5 kg (133 lb 6.1 oz)] 60.5 kg (133 lb 6.1 oz) (04/28 0200)    Intake/Output from previous day: 04/27 0701 - 04/28 0700 In: 2486.6 [I.V.:2036.6; Blood:350; IV Piggyback:100] Out: -  Intake/Output this shift:    PE:  General--alert nonresponsive Heart--RRR Lungs--clear Abdomen--soft and nontender  Lab Results:  Recent Labs  02/11/13 1032 02/13/13 0503 02/13/13 0539 02/13/13 0802 02/13/13 1322  WBC  --  7.0  --  13.3* 13.9*  HGB 14.3 12.1* 12.9* 13.2 14.9  HCT 42.0 36.8* 38.0* 39.0 42.7  PLT  --  78*  --  76* 77*   BMET  Recent Labs  02/11/13 1032 02/13/13 0539  NA 143 140  K 4.0 4.1  CL  --  103  CREATININE  --  5.60*   LFT No results found for this basename: PROT, AST, ALT, ALKPHOS, BILITOT, BILIDIR, IBILI,  in the last 72 hours PT/INR  Recent Labs  02/13/13 0503  LABPROT 17.4*  INR 1.47   PANCREAS No results found for this basename: LIPASE,  in the last 72 hours       Studies/Results: Dg Chest 1 View  02/13/2013  *RADIOLOGY REPORT*  Clinical Data: Vomiting.  Evaluate for free air.  CHEST - 1 VIEW  Comparison: 01/13/2013  Findings: Normal heart size and pulmonary vascularity.  Shallow inspiration.  There is increased density in the right lung base which could represent focal infiltration although there is an EKG lead in this region and this may represent the adhesive pad.  No other evidence of focal consolidation.  No airspace disease.  No blunting of costophrenic angles.  No pneumothorax.  No free air demonstrated in the  abdomen or mediastinum.  There is a central venous catheter tip in the right atrium, placed via inferior approach.  IMPRESSION: Increased density in the right lung base may represent focal infiltration versus artifact from the EKG lead.  Stable appearance of the inferior central venous catheter.  No evidence of free air in the chest or abdomen.   Original Report Authenticated By: Burman Nieves, M.D.    Dg Chest Port 1 View  02/14/2013  *RADIOLOGY REPORT*  Clinical Data: Right lower lobe aspiration pneumonia  PORTABLE CHEST - 1 VIEW  Comparison: 02/13/2013; 01/13/2013  Findings: Grossly unchanged cardiac silhouette and mediastinal contours with atherosclerotic calcifications within the thoracic aorta.  Stable position of support apparatus.  There is persistent mild elevation of the right hemidiaphragm with grossly unchanged right basilar heterogeneous air space opacities.  No new focal airspace opacity.  No definite pleural effusion or pneumothorax. Unchanged bones.  IMPRESSION: Grossly unchanged right basilar airspace opacities worrisome for infection and/or aspiration.  A follow-up chest radiograph in 4 to 6 weeks after treatment is recommended to ensure resolution.   Original Report Authenticated By: Tacey Ruiz, MD     Medications: I have reviewed the patient's current medications.  Assessment/Plan: 1.Hypotension/UGI bleed. Only sign of bleeding is the CG material on admission. Hg stable w/o transfusion. Not clear that the hypotension due to active bleeding. Pt arrested during previous EGD  and would probably need intubation if EGD is to be repeated. Currently on protonix and octreotide drips. Would follow clinically and consider stopping the octreotide tomorrow if no sign of bleeding.    Morenike Cuff JR,Gisela Lea L 02/14/2013, 8:28 AM

## 2013-02-14 NOTE — Progress Notes (Signed)
[facility]  Subjective  -   Family at bedside Resting comfortably   Physical Exam:  Doppler signal in left thigh AVGG Incisions clean       Assessment/Plan:    S/p L Thigh AVGG.  Re-admitted with GIB and hypotension Thigh graft remains patent despite hypotensive episodes. Please cantact me with any additional questions  Twala Collings IV, V. WELLS 02/14/2013 7:46 AM --  Filed Vitals:   02/14/13 0600  BP: 92/47  Pulse: 107  Temp:   Resp: 15    Intake/Output Summary (Last 24 hours) at 02/14/13 0746 Last data filed at 02/14/13 0600  Gross per 24 hour  Intake 2486.59 ml  Output      0 ml  Net 2486.59 ml     Laboratory CBC    Component Value Date/Time   WBC 13.9* 02/13/2013 1322   HGB 14.9 02/13/2013 1322   HCT 42.7 02/13/2013 1322   PLT 77* 02/13/2013 1322    BMET    Component Value Date/Time   NA 140 02/13/2013 0539   K 4.1 02/13/2013 0539   CL 103 02/13/2013 0539   CO2 29 12/25/2012 0550   GLUCOSE 198* 02/13/2013 0539   BUN 27* 02/13/2013 0539   CREATININE 5.60* 02/13/2013 0539   CALCIUM 9.4 12/25/2012 0550   GFRNONAA 7* 12/25/2012 0550   GFRAA 8* 12/25/2012 0550    COAG Lab Results  Component Value Date   INR 1.47 02/13/2013   INR 1.43 11/24/2010   INR 1.88* 11/24/2010   No results found for this basename: PTT    Antibiotics Anti-infectives   Start     Dose/Rate Route Frequency Ordered Stop   02/15/13 1200  vancomycin (VANCOCIN) 500 mg in sodium chloride 0.9 % 100 mL IVPB     500 mg 100 mL/hr over 60 Minutes Intravenous Every T-Th-Sa (Hemodialysis) 02/13/13 1314     02/14/13 1200  vancomycin (VANCOCIN) 500 mg in sodium chloride 0.9 % 100 mL IVPB  Status:  Discontinued     500 mg 100 mL/hr over 60 Minutes Intravenous Every M-W-F (Hemodialysis) 02/13/13 1030 02/13/13 1314   02/13/13 1400  piperacillin-tazobactam (ZOSYN) IVPB 2.25 g     2.25 g 100 mL/hr over 30 Minutes Intravenous 3 times per day 02/13/13 1030     02/13/13  1100  vancomycin (VANCOCIN) 1,250 mg in sodium chloride 0.9 % 250 mL IVPB     1,250 mg 166.7 mL/hr over 90 Minutes Intravenous  Once 02/13/13 1030 02/13/13 1242   02/13/13 0715  vancomycin (VANCOCIN) IVPB 1000 mg/200 mL premix  Status:  Discontinued     1,000 mg 200 mL/hr over 60 Minutes Intravenous  Once 02/13/13 0704 02/13/13 1020   02/13/13 0715  piperacillin-tazobactam (ZOSYN) IVPB 2.25 g     2.25 g 100 mL/hr over 30 Minutes Intravenous  Once 02/13/13 0704 02/13/13 1030       V. Charlena Cross, M.D. [facility] Office: [phone] Pager:  [phone]

## 2013-02-15 DIAGNOSIS — B957 Other staphylococcus as the cause of diseases classified elsewhere: Secondary | ICD-10-CM

## 2013-02-15 LAB — RENAL FUNCTION PANEL
CO2: 20 mEq/L (ref 19–32)
GFR calc Af Amer: 6 mL/min — ABNORMAL LOW (ref >90)
GFR calc non Af Amer: 5 mL/min — ABNORMAL LOW (ref >90)
Glucose, Bld: 120 mg/dL — ABNORMAL HIGH (ref 70–99)
Potassium: 4.3 mEq/L (ref 3.5–5.1)
Sodium: 138 mEq/L (ref 135–145)

## 2013-02-15 LAB — CBC
Hemoglobin: 12.3 g/dL — ABNORMAL LOW (ref 13.0–17.0)
RBC: 3.78 MIL/uL — ABNORMAL LOW (ref 4.22–5.81)
WBC: 8.2 10*3/uL (ref 4.0–10.5)

## 2013-02-15 LAB — GLUCOSE, CAPILLARY
Glucose-Capillary: 113 mg/dL — ABNORMAL HIGH (ref 70–99)
Glucose-Capillary: 115 mg/dL — ABNORMAL HIGH (ref 70–99)
Glucose-Capillary: 115 mg/dL — ABNORMAL HIGH (ref 70–99)

## 2013-02-15 MED ORDER — HEPARIN SODIUM (PORCINE) 1000 UNIT/ML IJ SOLN
INTRAMUSCULAR | Status: AC
  Administered 2013-02-15: 3400 [IU]
  Filled 2013-02-15: qty 4

## 2013-02-15 MED ORDER — HEPARIN SODIUM (PORCINE) 1000 UNIT/ML IJ SOLN: 3400.0000 [IU] | Freq: Once | INTRAMUSCULAR | Status: DC

## 2013-02-15 NOTE — Progress Notes (Signed)
ANTIBIOTIC CONSULT NOTE  Pharmacy Consult for Vancomycin and Zosyn Indication: rule out pneumonia (possible aspiration)  No Known Allergies  Patient Measurements: Weight: 134 lb 7.7 oz (61 kg) Height = 61 inches Weight = 56 kg  Vital Signs: Temp: 97.7 F (36.5 C) (04/29 0744) Temp src: Oral (04/29 0744) BP: 91/49 mmHg (04/29 1000) Pulse Rate: 93 (04/29 1000) Intake/Output from previous day: 04/28 0701 - 04/29 0700 In: 1313 [I.V.:663; IV Piggyback:650] Out: -  Intake/Output from this shift: Total I/O In: 118.2 [P.O.:60; I.V.:58.2] Out: -   Labs:  Recent Labs  02/13/13 0539  02/13/13 1322 02/14/13 0825 02/15/13 0430  WBC  --   < > 13.9* 9.2 8.2  HGB 12.9*  < > 14.9 13.7 12.3*  PLT  --   < > 77* 88* 100*  CREATININE 5.60*  --   --  8.30* 10.03*  < > = values in this interval not displayed. ESRD with HD TTS   Microbiology: Recent Results (from the past 720 hour(s))  CULTURE, BLOOD (ROUTINE X 2)     Status: None   Collection Time    02/13/13  8:19 AM      Result Value Range Status   Specimen Description BLOOD LEFT HAND   Final   Special Requests BOTTLES DRAWN AEROBIC ONLY 3CC   Final   Culture  Setup Time 02/13/2013 18:00   Final   Culture     Final   Value: GRAM POSITIVE COCCI IN CLUSTERS     Note: Gram Stain Report Called to,Read Back By and Verified With: JOY HOLLAND ON 02/15/2013 AT 2:46A BY WILEJ   Report Status PENDING   Incomplete  MRSA PCR SCREENING     Status: None   Collection Time    02/13/13  9:48 AM      Result Value Range Status   MRSA by PCR NEGATIVE  NEGATIVE Final   Comment:            The GeneXpert MRSA Assay (FDA     approved for NASAL specimens     only), is one component of a     comprehensive MRSA colonization     surveillance program. It is not     intended to diagnose MRSA     infection nor to guide or     monitor treatment for     MRSA infections.    Medical History: Past Medical History  Diagnosis Date  . Upper respiratory  infection 12/12/2009  . Heart murmur, systolic 08/10/2009  . 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 Hypotension 05/26/2009  . Syncope 05/07/2009  . 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 Esophageal varices 11/07/2008  . Redness or discharge of eye 11/06/2008  . Skin tag 08/25/2008  . Gastric ulcer 10/09    antral, with h pylori positive  . Abdominal pain, acute, generalized 06/28/2008  . Congestive heart failure 03/06/2008  . Cellulitis and abscess of leg, except foot 03/06/2008  . Sinus tachycardia 03/06/2008  . Edema 03/06/2008  . Impacted cerumen of both ears 03/06/2008  . Secondary hyperparathyroidism 02/02/2008  . Mute 02/02/2008  . Mental retardation 02/02/2008  . Hyperlipidemia 02/02/2008  . Anemia 02/02/2008  . ESRD (end stage renal disease)     TTS hemodialysis  . GERD (gastroesophageal reflux disease)   . Hypertension 02/02/2008    in history  . Seizures     "non in a while at home"    Medications:  Prescriptions prior to admission  Medication Sig Dispense Refill  .  atorvastatin (LIPITOR) 20 MG tablet Take 20 mg by mouth at bedtime.       . clonazePAM (KLONOPIN) 0.5 MG tablet Take 0.5 tablets (0.25 mg total) by mouth 2 (two) times daily.  30 tablet  3  . folic acid-vitamin b complex-vitamin c-selenium-zinc (DIALYVITE) 3 MG TABS Take 1 tablet by mouth daily.  30 tablet  11  . midodrine (PROAMATINE) 10 MG tablet Take 15 mg by mouth 3 (three) times daily.       . pantoprazole (PROTONIX) 40 MG tablet Take 40 mg by mouth daily.      . phenytoin (DILANTIN) 100 MG ER capsule Take 200 mg by mouth at bedtime.      . sevelamer carbonate (RENVELA) 800 MG tablet Take 800 mg by mouth 3 (three) times daily with meals.       Assessment: 62 yo M presents to [facility] ED with hypotension and GIB (coffee ground emesis and blood in stool).  Pt has PMH significant for ESRD with HD TTS, portal hypertension, liver disease, and possible variceal bleed/gastric ulcer.    Today, patient remains on pressors  with no indication for CRRT at this time, per renal.  Plan is to delay HD until tomorrow.  Goal of Therapy:  Pre-HD Vancomycin level 15-25 mcg/ml  Plan:  - Vancomycin 500 mg qHD  -next dose tomorrow to coincide with planned HD, then resume TTS - Continue Zosyn 3.375G IV q8h to be infused over 4 hours - Follow up SCr, UOP, cultures, clinical course and adjust as clinically indicated  Haydon Dorris L. Illene Bolus, PharmD, BCPS Clinical Pharmacist Pager: [phone] Pharmacy: [phone] 02/15/2013 10:22 AM

## 2013-02-15 NOTE — Consult Note (Signed)
[facility]    Date of Admission:  02/13/2013  Date of Consult:  02/15/2013  Reason for Consult: [facility] in 1/2 cultures in pt admitted to ICU with sepsis Referring Physician: Dr. Sung Amabile   HPI: [name] is an 62 y.o. 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 The patient has been hypotensive and has elevated lactic acid on arrival. He had recently had left gore tex graft placed in the left thigh and currently receives HD thru right femoral HD catheter. He was admitted to ICU and is beign worked up for GI bleed and cause of hypotension. He had admission blood cultures drawn and was placed on sepsis protocol abx with vancomycin and zosyn, now growing GPCC in 1/2 admission cultures, ID not back yet. His admission CXR with bibasilar infiltrates.    Past Medical History  Diagnosis Date  . Upper respiratory infection 12/12/2009  . Heart murmur, systolic 08/10/2009  . 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 Hypotension 05/26/2009  . Syncope 05/07/2009  . 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 Esophageal varices 11/07/2008  . Redness or discharge of eye 11/06/2008  . Skin tag 08/25/2008  . Gastric ulcer 10/09    antral, with h pylori positive  . Abdominal pain, acute, generalized 06/28/2008  . Congestive heart failure 03/06/2008  . Cellulitis and abscess of leg, except foot 03/06/2008  . Sinus tachycardia 03/06/2008  . Edema 03/06/2008  . Impacted cerumen of both ears 03/06/2008  . Secondary hyperparathyroidism 02/02/2008  . Mute 02/02/2008  . Mental retardation 02/02/2008  . Hyperlipidemia 02/02/2008  . Anemia 02/02/2008  . ESRD (end stage renal disease)     TTS hemodialysis  . GERD (gastroesophageal reflux disease)   . Hypertension 02/02/2008    in history  . Seizures     "non in a while at home"    Past Surgical History  Procedure Laterality Date    . Left forearm graft      for HD  . Arteriovenous graft placement  11/22/10    Right thigh AVG  . Thrombectomy and revision of arterioventous (av) goretex  graft    . Thrombectomy and revision of arterioventous (av) goretex  graft  10/22/2012    Procedure: THROMBECTOMY AND REVISION OF ARTERIOVENTOUS (AV) GORETEX  GRAFT;  Surgeon: Larina Earthly, MD;  Location: [facility] OR;  Service: Vascular;  Laterality: Right;  . Thrombectomy w/ embolectomy  11/10/2012    Procedure: THROMBECTOMY ARTERIOVENOUS GORE-TEX GRAFT;  Surgeon: Pryor Ochoa, MD;  Location: [facility] OR;  Service: Vascular;  Laterality: Right;  . Thrombectomy and revision of arterioventous (av) goretex  graft Right 12/08/2012    Procedure: THROMBECTOMY AND REVISION OF ARTERIOVENTOUS (AV) GORETEX  GRAFT right thigh;  Surgeon: Sherren Kerns, MD;  Location: [facility] OR;  Service: Vascular;  Laterality: Right;  Susie Cassette N/A 12/08/2012    Procedure: VENOGRAM;  Surgeon: Sherren Kerns, MD;  Location: [facility] OR;  Service: Vascular;  Laterality: N/A;  Intraoperative Central venogram  . Thrombectomy w/ embolectomy Right 12/12/2012    Procedure: THROMBECTOMY ARTERIOVENOUS GORE-TEX GRAFT;  Surgeon: Nada Libman, MD;  Location: [facility] OR;  Service: Vascular;  Laterality: Right;  . Insertion of dialysis catheter Left 12/14/2012    Procedure: INSERTION OF DIALYSIS CATHETER;  Surgeon: Nada Libman, MD;  Location: [facility] OR;  Service: Vascular;  Laterality: Left;  .  Insertion of dialysis catheter Right 01/13/2013    Procedure: INSERTION OF DIALYSIS CATHETER;  Surgeon: Nada Libman, MD;  Location: [facility] OR;  Service: Vascular;  Laterality: Right;  . Removal of a dialysis catheter Left 01/13/2013    Procedure: REMOVAL OF A DIALYSIS CATHETER;  Surgeon: Nada Libman, MD;  Location: [facility] OR;  Service: Vascular;  Laterality: Left;  . Av fistula placement Left 02/11/2013    Procedure: INSERTION OF ARTERIOVENOUS (AV) GORE-TEX GRAFT THIGH;  Surgeon: Nada Libman, MD;  Location: [facility]  OR;  Service: Vascular;  Laterality: Left;  using 6mm x 50cm Gore-Tex Vascular Graft  ergies:   No Known Allergies   Medications: I have reviewed patients current medications as documented in Epic Anti-infectives   Start     Dose/Rate Route Frequency Ordered Stop   02/15/13 1200  vancomycin (VANCOCIN) 500 mg in sodium chloride 0.9 % 100 mL IVPB     500 mg 100 mL/hr over 60 Minutes Intravenous Every T-Th-Sa (Hemodialysis) 02/13/13 1314     02/14/13 1200  vancomycin (VANCOCIN) 500 mg in sodium chloride 0.9 % 100 mL IVPB  Status:  Discontinued     500 mg 100 mL/hr over 60 Minutes Intravenous Every M-W-F (Hemodialysis) 02/13/13 1030 02/13/13 1314   02/13/13 1400  piperacillin-tazobactam (ZOSYN) IVPB 2.25 g     2.25 g 100 mL/hr over 30 Minutes Intravenous 3 times per day 02/13/13 1030     02/13/13 1100  vancomycin (VANCOCIN) 1,250 mg in sodium chloride 0.9 % 250 mL IVPB     1,250 mg 166.7 mL/hr over 90 Minutes Intravenous  Once 02/13/13 1030 02/13/13 1242   02/13/13 0715  vancomycin (VANCOCIN) IVPB 1000 mg/200 mL premix  Status:  Discontinued     1,000 mg 200 mL/hr over 60 Minutes Intravenous  Once 02/13/13 0704 02/13/13 1020   02/13/13 0715  piperacillin-tazobactam (ZOSYN) IVPB 2.25 g     2.25 g 100 mL/hr over 30 Minutes Intravenous  Once 02/13/13 0704 02/13/13 1030      Social History:  reports that he has never smoked. He does not have any smokeless tobacco history on file. He reports that he does not drink alcohol or use illicit drugs.  No family history on file.  As in HPI and primary teams notes otherwise 12 point review of systems is negative  Blood pressure 82/42, pulse 95, temperature 98.3 F (36.8 C), temperature source Oral, resp. rate 23, weight 134 lb 7.7 oz (61 kg), SpO2 100.00%. General: Alert and awake, makes gestures to move covers back onto him HEENT: anicteric sclera,EOMI, oropharynx clear and without exudate CVS  Tachycardicrate, normal r,  no murmur rubs or  gallops Chest: fairly clear to auscultation bilaterally, no wheezing, rales or rhonchi Abdomen: soft nontender, nondistended, normal bowel sounds, Extremities: no  clubbing or edema noted bilaterally Skin: right HD cathter is clean, graft site is intact on the left, he has he has right EJ line   Neuro: nonfocal,    Results for orders placed during the hospital encounter of 02/13/13 (from the past 48 hour(s))  GLUCOSE, CAPILLARY     Status: Abnormal   Collection Time    02/13/13  7:48 PM      Result Value Range   Glucose-Capillary 142 (*) 70 - 99 mg/dL   Comment 1 Notify RN    TROPONIN I     Status: None   Collection Time    02/13/13 10:00 PM      Result Value Range  Troponin I <0.30  <0.30 ng/mL   Comment:            Due to the release kinetics of cTnI,     a negative result within the first hours     of the onset of symptoms does not rule out     myocardial infarction with certainty.     If myocardial infarction is still suspected,     repeat the test at appropriate intervals.  GLUCOSE, CAPILLARY     Status: Abnormal   Collection Time    02/14/13 12:48 AM      Result Value Range   Glucose-Capillary 142 (*) 70 - 99 mg/dL   Comment 1 Notify RN    GLUCOSE, CAPILLARY     Status: Abnormal   Collection Time    02/14/13  3:23 AM      Result Value Range   Glucose-Capillary 144 (*) 70 - 99 mg/dL   Comment 1 Notify RN    GLUCOSE, CAPILLARY     Status: Abnormal   Collection Time    02/14/13  8:13 AM      Result Value Range   Glucose-Capillary 147 (*) 70 - 99 mg/dL  BASIC METABOLIC PANEL     Status: Abnormal   Collection Time    02/14/13  8:25 AM      Result Value Range   Sodium 136  135 - 145 mEq/L   Potassium 4.4  3.5 - 5.1 mEq/L   Chloride 98  96 - 112 mEq/L   CO2 23  19 - 32 mEq/L   Glucose, Bld 163 (*) 70 - 99 mg/dL   BUN 47 (*) 6 - 23 mg/dL   Creatinine, Ser 1.61 (*) 0.50 - 1.35 mg/dL   Calcium 9.3  8.4 - 09.6 mg/dL   GFR calc non Af Amer 6 (*) >90 mL/min   GFR  calc Af Amer 7 (*) >90 mL/min   Comment:            The eGFR has been calculated     using the CKD EPI equation.     This calculation has not been     validated in all clinical     situations.     eGFR's persistently     <90 mL/min signify     possible Chronic Kidney Disease.  CBC     Status: Abnormal   Collection Time    02/14/13  8:25 AM      Result Value Range   WBC 9.2  4.0 - 10.5 K/uL   RBC 4.35  4.22 - 5.81 MIL/uL   Hemoglobin 13.7  13.0 - 17.0 g/dL   HCT 04.5  40.9 - 81.1 %   MCV 92.6  78.0 - 100.0 fL   MCH 31.5  26.0 - 34.0 pg   MCHC 34.0  30.0 - 36.0 g/dL   RDW 91.4 (*) 78.2 - 95.6 %   Platelets 88 (*) 150 - 400 K/uL   Comment: CONSISTENT WITH PREVIOUS RESULT  LACTIC ACID, PLASMA     Status: None   Collection Time    02/14/13  8:30 AM      Result Value Range   Lactic Acid, Venous 1.0  0.5 - 2.2 mmol/L  GLUCOSE, CAPILLARY     Status: Abnormal   Collection Time    02/14/13 12:31 PM      Result Value Range   Glucose-Capillary 124 (*) 70 - 99 mg/dL  GLUCOSE, CAPILLARY     Status:  Abnormal   Collection Time    02/14/13  3:06 PM      Result Value Range   Glucose-Capillary 108 (*) 70 - 99 mg/dL  GLUCOSE, CAPILLARY     Status: Abnormal   Collection Time    02/14/13  7:33 PM      Result Value Range   Glucose-Capillary 107 (*) 70 - 99 mg/dL  GLUCOSE, CAPILLARY     Status: Abnormal   Collection Time    02/15/13 12:15 AM      Result Value Range   Glucose-Capillary 113 (*) 70 - 99 mg/dL  GLUCOSE, CAPILLARY     Status: Abnormal   Collection Time    02/15/13  4:18 AM      Result Value Range   Glucose-Capillary 115 (*) 70 - 99 mg/dL   Comment 1 Documented in Chart     Comment 2 Notify RN    CBC     Status: Abnormal   Collection Time    02/15/13  4:30 AM      Result Value Range   WBC 8.2  4.0 - 10.5 K/uL   RBC 3.78 (*) 4.22 - 5.81 MIL/uL   Hemoglobin 12.3 (*) 13.0 - 17.0 g/dL   HCT 16.1 (*) 09.6 - 04.5 %   MCV 93.9  78.0 - 100.0 fL   MCH 32.5  26.0 - 34.0 pg     MCHC 34.6  30.0 - 36.0 g/dL   RDW 40.9 (*) 81.1 - 91.4 %   Platelets 100 (*) 150 - 400 K/uL   Comment: CONSISTENT WITH PREVIOUS RESULT  RENAL FUNCTION PANEL     Status: Abnormal   Collection Time    02/15/13  4:30 AM      Result Value Range   Sodium 138  135 - 145 mEq/L   Potassium 4.3  3.5 - 5.1 mEq/L   Chloride 99  96 - 112 mEq/L   CO2 20  19 - 32 mEq/L   Glucose, Bld 120 (*) 70 - 99 mg/dL   BUN 57 (*) 6 - 23 mg/dL   Creatinine, Ser 78.29 (*) 0.50 - 1.35 mg/dL   Calcium 8.8  8.4 - 56.2 mg/dL   Phosphorus 6.9 (*) 2.3 - 4.6 mg/dL   Albumin 2.5 (*) 3.5 - 5.2 g/dL   GFR calc non Af Amer 5 (*) >90 mL/min   GFR calc Af Amer 6 (*) >90 mL/min   Comment:            The eGFR has been calculated     using the CKD EPI equation.     This calculation has not been     validated in all clinical     situations.     eGFR's persistently     <90 mL/min signify     possible Chronic Kidney Disease.  GLUCOSE, CAPILLARY     Status: Abnormal   Collection Time    02/15/13  7:41 AM      Result Value Range   Glucose-Capillary 115 (*) 70 - 99 mg/dL  GLUCOSE, CAPILLARY     Status: Abnormal   Collection Time    02/15/13 11:45 AM      Result Value Range   Glucose-Capillary 140 (*) 70 - 99 mg/dL  GLUCOSE, CAPILLARY     Status: Abnormal   Collection Time    02/15/13  3:31 PM      Result Value Range   Glucose-Capillary 124 (*) 70 - 99 mg/dL  Component Value Date/Time   SDES BLOOD LEFT HAND 02/13/2013 0819   SPECREQUEST BOTTLES DRAWN AEROBIC ONLY 3CC 02/13/2013 0819   CULT  Value: GRAM POSITIVE COCCI IN CLUSTERS Note: Gram Stain Report Called to,Read Back By and Verified With: JOY HOLLAND ON 02/15/2013 AT 2:46A BY WILEJ 02/13/2013 0819   REPTSTATUS PENDING 02/13/2013 0819   Dg Chest Port 1 View  02/14/2013  *RADIOLOGY REPORT*  Clinical Data: Right lower lobe aspiration pneumonia  PORTABLE CHEST - 1 VIEW  Comparison: 02/13/2013; 01/13/2013  Findings: Grossly unchanged cardiac silhouette and  mediastinal contours with atherosclerotic calcifications within the thoracic aorta.  Stable position of support apparatus.  There is persistent mild elevation of the right hemidiaphragm with grossly unchanged right basilar heterogeneous air space opacities.  No new focal airspace opacity.  No definite pleural effusion or pneumothorax. Unchanged bones.  IMPRESSION: Grossly unchanged right basilar airspace opacities worrisome for infection and/or aspiration.  A follow-up chest radiograph in 4 to 6 weeks after treatment is recommended to ensure resolution.   Original Report Authenticated By: Tacey Ruiz, MD      Recent Results (from the past 720 hour(s))  CULTURE, BLOOD (ROUTINE X 2)     Status: None   Collection Time    02/13/13  8:02 AM      Result Value Range Status   Specimen Description BLOOD   Final   Special Requests BOTTLES DRAWN AEROBIC ONLY LEFT EJ 10CC   Final   Culture  Setup Time 02/13/2013 18:00   Final   Culture     Final   Value:        BLOOD CULTURE RECEIVED NO GROWTH TO DATE CULTURE WILL BE HELD FOR 5 DAYS BEFORE ISSUING A FINAL NEGATIVE REPORT   Report Status PENDING   Incomplete  CULTURE, BLOOD (ROUTINE X 2)     Status: None   Collection Time    02/13/13  8:19 AM      Result Value Range Status   Specimen Description BLOOD LEFT HAND   Final   Special Requests BOTTLES DRAWN AEROBIC ONLY 3CC   Final   Culture  Setup Time 02/13/2013 18:00   Final   Culture     Final   Value: GRAM POSITIVE COCCI IN CLUSTERS     Note: Gram Stain Report Called to,Read Back By and Verified With: JOY HOLLAND ON 02/15/2013 AT 2:46A BY WILEJ   Report Status PENDING   Incomplete  MRSA PCR SCREENING     Status: None   Collection Time    02/13/13  9:48 AM      Result Value Range Status   MRSA by PCR NEGATIVE  NEGATIVE Final   Comment:            The GeneXpert MRSA Assay (FDA     approved for NASAL specimens     only), is one component of a     comprehensive MRSA colonization     surveillance  program. It is not     intended to diagnose MRSA     infection nor to guide or     monitor treatment for     MRSA infections.     Impression/Recommendation  62 year old with mx medical problems admitted to ICU with shock, lactic acidosis thought to be suffering sig GIB with coffee ground emesis, blood per rectum, admitted and started on a myosin and Zosyn after admission blood cultures were obtained. One of his 2 blood cultures now growing gram-positive cocci  in clusters. Admission chest x-ray showed bibasilar infiltrates.  #1 GPCC in 1/2 cultures: If this is Coag negative staph then is high liklihood of contaminant. IF it is MRSA or MSSAureus then it will NEED to be worked up and treated with protracted abx  --continue current abx --fu ID on organism  #2 Sepsis? Could be due to true bacteremia vs HCAP +/- other causes of hypotension: Agree with broad spectrum abx for now  #3 Screening: would check HIV   Thank you so much for this interesting consult  [facility] [phone] (pager) [phone] (office) 02/15/2013, 5:48 PM  Paulette Blanch Dam 02/15/2013, 5:48 PM

## 2013-02-15 NOTE — Progress Notes (Signed)
CRITICAL VALUE ALERT  Critical value received:  Gram + cocci in clusters in aerobic blood cx from 4/27  Date of notification:  4/29  Time of notification:  0246  Critical value read back:yes  Nurse who received alert:  Crist Fat RN  MD notified (1st page):  Dr. Kennerly([facility] resident) Time of first page:  0250  MD notified (2nd page):NA  Time of second page:NA  Responding MD:  Garald Braver  Time MD responded:  [phone]

## 2013-02-15 NOTE — Progress Notes (Signed)
Name: [name] MRN: [id number] DOB: [birth year] LOS: 2  PCCM RESIDENT DAILY PROGRESS NOTE  History of Present Illness: 62 year old African American mentally disabled and mute male with ESRD  status post recent AV fistula on left arm on 4/25, esophageal varices, egastric ulcer H. pylori+2009, seizure disorder, and hyperlipidemia presents with severe lactic acidosis and hypotension. He reports having coffee ground emesis and blood in stools.   Lines / Drains: Right EJ 4/27>> Right femoral HD catheter>> Left AV graft 4/25>>  Cultures: Blood cultures 4/27>> 1/2 Gram positive cocci in clusters  Antibiotics: Vanc 4/27>>4/28 Vanc 4/29>> Zosyn 4/27>>  Tests / Events: 02/13/2013 admission in the emergency room with hypotension   Overnight Events: No witnessed episodes of bleeding  Vital Signs: Temp:  [97.4 F (36.3 C)-97.8 F (36.6 C)] 97.7 F (36.5 C) (04/29 0744) Pulse Rate:  [78-127] 89 (04/29 0700) Resp:  [9-20] 15 (04/29 0700) BP: (49-162)/(27-58) 76/37 mmHg (04/29 0700) SpO2:  [91 %-100 %] 96 % (04/29 0700) FiO2 (%):  [3 %] 3 % (04/29 0600) Weight:  [134 lb 7.7 oz (61 kg)] 134 lb 7.7 oz (61 kg) (04/29 0200) I/O last 3 completed shifts: In: 2366.6 [I.V.:1616.6; IV Piggyback:750] Out: -   Physical Examination: General: Ill-appearing African American male  Neuro: Unable to speak this is a chronic problem  HEENT: Bilateral external jugular lines  Cardiovascular: Regular rate and rhythm normal S1-S2 no S3 or S4  Lungs: Distant breath sounds  Abdomen: Firm abdomen no rebound or guarding  Musculoskeletal: Left AV fistula, no thrill  Skin: Clear   Ventilator settings: Vent Mode:  [-]  FiO2 (%):  [3 %] 3 %  Recent Labs  02/13/13 0539 02/13/13 0558  PHART  --  7.327*  PO2ART  --  83.0  TCO2 23 22  HCO3  --  20.6   Labs and Imaging:   Basic Metabolic Panel:  Recent Labs Lab 02/14/13 0825 02/15/13 0430  NA 136 138  K 4.4 4.3  CL 98 99  CO2 23 20   GLUCOSE 163* 120*  BUN 47* 57*  CREATININE 8.30* 10.03*  CALCIUM 9.3 8.8  PHOS  --  6.9*    Recent Labs Lab 02/13/13 1322  AMYLASE 248*   CBC:  Recent Labs Lab 02/13/13 0503  02/13/13 0802  02/14/13 0825 02/15/13 0430  WBC 7.0  --  13.3*  < > 9.2 8.2  NEUTROABS 5.8  --  11.3*  --   --   --   HGB 12.1*  < > 13.2  < > 13.7 12.3*  HCT 36.8*  < > 39.0  < > 40.3 35.5*  MCV 99.5  --  95.1  < > 92.6 93.9  PLT 78*  --  76*  < > 88* 100*  < > = values in this interval not displayed. Cardiac Enzymes:  Recent Labs Lab 02/13/13 1322 02/13/13 1610 02/13/13 2200  TROPONINI <0.30 <0.30 <0.30   BNP:  Recent Labs Lab 02/13/13 1322  PROBNP 1218.0*   CBG:  Recent Labs Lab 02/14/13 0813 02/14/13 1231 02/14/13 1506 02/14/13 1933 02/15/13 0015 02/15/13 0418  GLUCAP 147* 124* 108* 107* 113* 115*   Coagulation:  Recent Labs Lab 02/13/13 0503  LABPROT 17.4*  INR 1.47     Recent Labs Lab 02/13/13 0644 02/13/13 1322 02/14/13 0830  LATICACIDVEN 9.09*  --  1.0  PROCALCITON  --  37.80  --     Assessment and Plan: PULMONARY  ASSESSMENT:Mild hypoxemia   HCAP vs aspiration  with RLL infiltrate, concern for  aspiration PLAN:   Goal O2>92% Trend CXR See ID for abx  CARDIOVASCULAR  ASSESSMENT: Hypovolemic shock with ? GI bleed vs excess volume removal with HD vs septic shock from HACP  ( ? Aspiration) Lactate- 9.9 >1.0 PLAN:  S/p 1 unit of PRBC Titrate pressors with MAP goal >50   RENAL  ASSESSMENT:  ESRD PLAN:   Renal on board   GASTROINTESTINAL  ASSESSMENT:  GI bleeding probable upper source. History of varices and gastric ulcers . EGD in 11/2010 for a GI bleed showed maroon blood in the duodenum and black colored fluid in the stomach with NO source identified along with small nonbleeding esophageal varices   PLAN:   GI following along! Protonix drip  Octreotide drip   HEMATOLOGIC  ASSESSMENT:  History of anemia. Now presents with ? GI  bleed. 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 H and H stable for now.   Best practices / Disposition: -->ICU status under PCCM -->full code -->Heparin for DVT Px -->Protonix for GI Px -->ventilator bundle -->diet -->family updated at bedside  SAWHNEY,MEGHA 02/15/2013, 7:59 AM  The patient is critically ill with multiple organ systems failure and requires high complexity decision making for assessment and support, frequent evaluation and titration of therapies, application of advanced monitoring technologies and extensive interpretation of multiple databases.    Billy Fischer, MD ; [facility] [phone].  After 5:30 PM or weekends, call [phone]

## 2013-02-15 NOTE — Progress Notes (Signed)
Subjective:  Pt still on pressors but less- hgb is dropping slowly but still above 12.  Found to have GPC in blood, vanc added Objective Vital signs in last 24 hours: Filed Vitals:   02/15/13 0500 02/15/13 0600 02/15/13 0630 02/15/13 0700  BP: 79/47 69/41 87/48  76/37  Pulse: 88 82 89 89  Temp:      TempSrc:      Resp: 15 14 15 15   Weight:      SpO2: 100% 100% 98% 96%   Weight change: 0.5 kg (1 lb 1.6 oz)  Intake/Output Summary (Last 24 hours) at 02/15/13 0730 Last data filed at 02/15/13 0700  Gross per 24 hour  Intake   1313 ml  Output      0 ml  Net   1313 ml   Labs: Basic Metabolic Panel:  Recent Labs Lab 02/13/13 0539 02/14/13 0825 02/15/13 0430  NA 140 136 138  K 4.1 4.4 4.3  CL 103 98 99  CO2  --  23 20  GLUCOSE 198* 163* 120*  BUN 27* 47* 57*  CREATININE 5.60* 8.30* 10.03*  CALCIUM  --  9.3 8.8  PHOS  --   --  6.9*   Liver Function Tests:  Recent Labs Lab 02/15/13 0430  ALBUMIN 2.5*    Recent Labs Lab 02/13/13 1322  AMYLASE 248*   No results found for this basename: AMMONIA,  in the last 168 hours CBC:  Recent Labs Lab 02/13/13 0503  02/13/13 0802 02/13/13 1322 02/14/13 0825 02/15/13 0430  WBC 7.0  --  13.3* 13.9* 9.2 8.2  NEUTROABS 5.8  --  11.3*  --   --   --   HGB 12.1*  < > 13.2 14.9 13.7 12.3*  HCT 36.8*  < > 39.0 42.7 40.3 35.5*  MCV 99.5  --  95.1 91.8 92.6 93.9  PLT 78*  --  76* 77* 88* 100*  < > = values in this interval not displayed. Cardiac Enzymes:  Recent Labs Lab 02/13/13 1322 02/13/13 1610 02/13/13 2200  TROPONINI <0.30 <0.30 <0.30   CBG:  Recent Labs Lab 02/14/13 1231 02/14/13 1506 02/14/13 1933 02/15/13 0015 02/15/13 0418  GLUCAP 124* 108* 107* 113* 115*    Iron Studies: No results found for this basename: IRON, TIBC, TRANSFERRIN, FERRITIN,  in the last 72 hours Studies/Results: Dg Chest Port 1 View  02/14/2013  *RADIOLOGY REPORT*  Clinical Data: Right lower lobe aspiration pneumonia  PORTABLE CHEST  - 1 VIEW  Comparison: 02/13/2013; 01/13/2013  Findings: Grossly unchanged cardiac silhouette and mediastinal contours with atherosclerotic calcifications within the thoracic aorta.  Stable position of support apparatus.  There is persistent mild elevation of the right hemidiaphragm with grossly unchanged right basilar heterogeneous air space opacities.  No new focal airspace opacity.  No definite pleural effusion or pneumothorax. Unchanged bones.  IMPRESSION: Grossly unchanged right basilar airspace opacities worrisome for infection and/or aspiration.  [name] follow-up chest radiograph in 4 to 6 weeks after treatment is recommended to ensure resolution.   Original Report Authenticated By: Tacey Ruiz, MD    Medications: Infusions: . norepinephrine (LEVOPHED) Adult infusion 10 mcg/min (02/14/13 2000)    Scheduled Medications: . antiseptic oral rinse  15 mL Mouth Rinse BID  . heparin  3,400 Units Intravenous Once  . insulin aspart  1-3 Units Subcutaneous Q4H  . pantoprazole (PROTONIX) IV  40 mg Intravenous Q12H  . phenytoin (DILANTIN) IV  100 mg Intravenous Q12H  . piperacillin-tazobactam (ZOSYN)  IV  2.25  g Intravenous Q8H  . vancomycin  500 mg Intravenous Q T,Th,Sa-HD    have reviewed scheduled and prn medications.  Physical Exam: General: alert, nonverbal Heart: RRR Lungs: mostly clear Abdomen: soft, non tender Extremities: minimal edema Dialysis Access: femoral PC and new left thigh AVG- good bruit   I Assessment/ Plan: Pt is [name] 62 y.o. yo male who was admitted on 02/13/2013 with  Acute GIB after placement of thigh AVG on 4/24 Assessment/Plan: 1. GIB- coffee ground emesis - on protonix/octreotide- GI on board- deferring EGD for now due to instability 2. ESRD - s/p HD on  Saturday 4/26- regular day, normally TTS at [facility].  There are no indications for [place] to do dialysis today so would like to hold off another day to see if pressors can be discontinued.  Dont have any indications for CRRT and  would like to avoid in this setting but would need it tomorrow if still too unstable.  Now with the explanation of bacteremia could be reversible cause of hypotension.  3. Anemia- s/p one unit of PRBC- hgb falling but still above 12 4. Secondary hyperparathyroidism- generally controlled as OP, 6 of hectorol will be ordered with HD- binders on hold due to NPO status 5. HTN/volume- good O2 sats on [place] O2- hypotensive on pressors but are weaning down- baseline BP reportedly in the 80's to 90's 6. GPC bacteremia- either from femoral PC vs new thigh avg- now on zosyn and vanc   [name]   02/15/2013,7:30 AM  LOS: 2 days

## 2013-02-16 ENCOUNTER — Encounter (HOSPITAL_COMMUNITY): Payer: Self-pay | Admitting: *Deleted

## 2013-02-16 ENCOUNTER — Inpatient Hospital Stay (HOSPITAL_COMMUNITY): Payer: Medicare Other

## 2013-02-16 ENCOUNTER — Encounter (HOSPITAL_COMMUNITY)
Admission: EM | Disposition: A | Discharge status: Home Health | Payer: Self-pay | Source: Home / Self Care | Attending: Critical Care Medicine

## 2013-02-16 HISTORY — PX: ESOPHAGOGASTRODUODENOSCOPY: SHX5428

## 2013-02-16 LAB — GLUCOSE, CAPILLARY
Glucose-Capillary: 83 mg/dL (ref 70–99)
Glucose-Capillary: 65 mg/dL — ABNORMAL LOW (ref 70–99)

## 2013-02-16 LAB — RENAL FUNCTION PANEL
BUN: 69 mg/dL — ABNORMAL HIGH (ref 6–23)
Calcium: 8.5 mg/dL (ref 8.4–10.5)
Creatinine, Ser: 11.74 mg/dL — ABNORMAL HIGH (ref 0.50–1.35)
Glucose, Bld: 109 mg/dL — ABNORMAL HIGH (ref 70–99)
Phosphorus: 6.9 mg/dL — ABNORMAL HIGH (ref 2.3–4.6)
Sodium: 139 mEq/L (ref 135–145)

## 2013-02-16 LAB — CBC
HCT: 34.6 % — ABNORMAL LOW (ref 39.0–52.0)
HCT: 32.1 % — ABNORMAL LOW (ref 39.0–52.0)
Hemoglobin: 11.8 g/dL — ABNORMAL LOW (ref 13.0–17.0)
MCH: 31.9 pg (ref 26.0–34.0)
MCHC: 34.3 g/dL (ref 30.0–36.0)
RDW: 16.1 % — ABNORMAL HIGH (ref 11.5–15.5)
WBC: 7.9 10*3/uL (ref 4.0–10.5)

## 2013-02-16 LAB — TROPONIN I
Troponin I: <0.3 ng/mL (ref <0.30)
Troponin I: <0.3 ng/mL (ref <0.30)
Troponin I: <0.3 ng/mL (ref <0.30)

## 2013-02-16 SURGERY — EGD (ESOPHAGOGASTRODUODENOSCOPY)
Anesthesia: Moderate Sedation

## 2013-02-16 MED ORDER — MIDAZOLAM HCL 10 MG/2ML IJ SOLN
INTRAMUSCULAR | Status: DC | PRN
  Administered 2013-02-16 (×2): 1 mg via INTRAVENOUS

## 2013-02-16 MED ORDER — SODIUM CHLORIDE 0.9 % IV SOLN: 100.0000 mL | INTRAVENOUS | Status: DC | PRN

## 2013-02-16 MED ORDER — MIDODRINE HCL 5 MG PO TABS
10.0000 mg | ORAL_TABLET | Freq: Two times a day (BID) | ORAL | Status: DC
  Administered 2013-02-17: 10 mg via ORAL
  Filled 2013-02-16 (×4): qty 2

## 2013-02-16 MED ORDER — ALBUMIN HUMAN 25 % IV SOLN
25.0000 g | INTRAVENOUS | Status: AC
  Administered 2013-02-16 (×2): 25 g via INTRAVENOUS
  Filled 2013-02-16 (×3): qty 100

## 2013-02-16 MED ORDER — ALTEPLASE 100 MG IV SOLR
6.0000 mg | INTRAVENOUS | Status: AC
  Administered 2013-02-16: 6 mg
  Filled 2013-02-16: qty 6

## 2013-02-16 MED ORDER — SODIUM CHLORIDE 0.9 % IV SOLN: INTRAVENOUS | Status: DC

## 2013-02-16 MED ORDER — PENTAFLUOROPROP-TETRAFLUOROETH EX AERO: 1.0000 "application " | INHALATION_SPRAY | CUTANEOUS | Status: DC | PRN

## 2013-02-16 MED ORDER — CALCIUM ACETATE 667 MG PO CAPS
1334.0000 mg | ORAL_CAPSULE | Freq: Three times a day (TID) | ORAL | Status: DC
  Administered 2013-02-17 – 2013-02-20 (×6): 1334 mg via ORAL
  Filled 2013-02-16 (×15): qty 2

## 2013-02-16 MED ORDER — HEPARIN SODIUM (PORCINE) 1000 UNIT/ML DIALYSIS
1000.0000 [IU] | INTRAMUSCULAR | Status: DC | PRN
  Filled 2013-02-16: qty 1

## 2013-02-16 MED ORDER — ALTEPLASE 2 MG IJ SOLR
2.0000 mg | Freq: Once | INTRAMUSCULAR | Status: AC | PRN
  Filled 2013-02-16: qty 2

## 2013-02-16 MED ORDER — LIDOCAINE-PRILOCAINE 2.5-2.5 % EX CREA
1.0000 "application " | TOPICAL_CREAM | CUTANEOUS | Status: DC | PRN
  Filled 2013-02-16: qty 5

## 2013-02-16 MED ORDER — DOXERCALCIFEROL 4 MCG/2ML IV SOLN
6.0000 ug | Freq: Once | INTRAVENOUS | Status: AC
  Administered 2013-02-16: 6 ug via INTRAVENOUS
  Filled 2013-02-16: qty 4

## 2013-02-16 MED ORDER — LIDOCAINE HCL (PF) 1 % IJ SOLN: 5.0000 mL | INTRAMUSCULAR | Status: DC | PRN

## 2013-02-16 MED ORDER — BUTAMBEN-TETRACAINE-BENZOCAINE 2-2-14 % EX AERO
INHALATION_SPRAY | CUTANEOUS | Status: DC | PRN
  Administered 2013-02-16: 2 via TOPICAL

## 2013-02-16 MED ORDER — NEPRO/CARBSTEADY PO LIQD
237.0000 mL | ORAL | Status: DC | PRN
  Filled 2013-02-16: qty 237

## 2013-02-16 MED ORDER — FENTANYL CITRATE 0.05 MG/ML IJ SOLN
INTRAMUSCULAR | Status: DC | PRN
  Administered 2013-02-16: 12.5 ug via INTRAVENOUS

## 2013-02-16 NOTE — Progress Notes (Signed)
Subjective:  Was off pressors, resumed for number but no change in clinical status, now off again.  Clarification, his blood cultures were 1 out of 2 positive.  Appreciate ID input Objective Vital signs in last 24 hours: Filed Vitals:   02/16/13 0403 02/16/13 0500 02/16/13 0600 02/16/13 0731  BP:  83/46 74/45   Pulse:   88   Temp: 98.1 F (36.7 C)   98.1 F (36.7 C)  TempSrc: Oral   Oral  Resp:  16    Weight:      SpO2:   99%    Weight change:   Intake/Output Summary (Last 24 hours) at 02/16/13 0818 Last data filed at 02/16/13 0600  Gross per 24 hour  Intake 414.05 ml  Output      1 ml  Net 413.05 ml   Labs: Basic Metabolic Panel:  Recent Labs Lab 02/14/13 0825 02/15/13 0430 02/16/13 0210  NA 136 138 139  K 4.4 4.3 4.6  CL 98 99 101  CO2 23 20 21   GLUCOSE 163* 120* 109*  BUN 47* 57* 69*  CREATININE 8.30* 10.03* 11.74*  CALCIUM 9.3 8.8 8.5  PHOS  --  6.9* 6.9*   Liver Function Tests:  Recent Labs Lab 02/15/13 0430 02/16/13 0210  ALBUMIN 2.5* 2.4*    Recent Labs Lab 02/13/13 1322  AMYLASE 248*   No results found for this basename: AMMONIA,  in the last 168 hours CBC:  Recent Labs Lab 02/13/13 0503  02/13/13 0802 02/13/13 1322 02/14/13 0825 02/15/13 0430 02/16/13 0043 02/16/13 0645  WBC 7.0  --  13.3* 13.9* 9.2 8.2 7.9 6.6  NEUTROABS 5.8  --  11.3*  --   --   --   --   --   HGB 12.1*  < > 13.2 14.9 13.7 12.3* 11.8* 11.0*  HCT 36.8*  < > 39.0 42.7 40.3 35.5* 34.6* 32.1*  MCV 99.5  --  95.1 91.8 92.6 93.9 94.3 93.0  PLT 78*  --  76* 77* 88* 100* 97* 95*  < > = values in this interval not displayed. Cardiac Enzymes:  Recent Labs Lab 02/13/13 1322 02/13/13 1610 02/13/13 2200 02/16/13 0044 02/16/13 0645  TROPONINI <0.30 <0.30 <0.30 <0.30 <0.30   CBG:  Recent Labs Lab 02/15/13 1145 02/15/13 1531 02/15/13 1929 02/16/13 0005 02/16/13 0401  GLUCAP 140* 124* 109* 109* 109*    Iron Studies: No results found for this basename: IRON,  TIBC, TRANSFERRIN, FERRITIN,  in the last 72 hours Studies/Results: Dg Chest Port 1 View  02/16/2013  *RADIOLOGY REPORT*  Clinical Data: Shortness of breath.  PORTABLE CHEST - 1 VIEW  Comparison: 02/14/2013.  Findings: Central line enters from below diaphragm with the tip at the level of the superior aspect of the right atrium/caval atrial junction.  Slight improved aeration right lung base.  Mild elevation right hemidiaphragm.  Heart size within normal limits.  Calcified aorta.  No gross pneumothorax.  Mild central pulmonary vascular prominence without pulmonary edema.  IMPRESSION: Interval slight improved aeration right lung base.   Original Report Authenticated By: Lacy Duverney, M.D.    Medications: Infusions: . norepinephrine (LEVOPHED) Adult infusion 5 mcg/min (02/16/13 0230)    Scheduled Medications: . antiseptic oral rinse  15 mL Mouth Rinse BID  . heparin  3,400 Units Intravenous Once  . insulin aspart  1-3 Units Subcutaneous Q4H  . pantoprazole (PROTONIX) IV  40 mg Intravenous Q12H  . phenytoin (DILANTIN) IV  100 mg Intravenous Q12H  . piperacillin-tazobactam (ZOSYN)  IV  2.25 g Intravenous Q8H  . vancomycin  500 mg Intravenous Q T,Th,Sa-HD    have reviewed scheduled and prn medications.  Physical Exam: General: alert, nonverbal Heart: RRR Lungs: mostly clear Abdomen: soft, non tender Extremities: minimal edema Dialysis Access: femoral PC and new left thigh AVG- good bruit   I Assessment/ Plan: Pt is a 62 y.o. yo male who was admitted on 02/13/2013 with  Acute GIB after placement of thigh AVG on 4/24 Assessment/Plan: 1. GIB- coffee ground emesis - on protonix- GI on board- slow decrease in hgb, now that more stable GI will be doing an EGD today 2. ESRD - s/p HD on  Saturday 4/26- regular day, normally TTS at [facility].  Since has been so long since HD and is seemingly more stable, will do HD today with minimal volume removal, using albumin and midodrine for BP support.  3.  Anemia- s/p PRBC- hgb falling slowly.  No heparin with HD 4. Secondary hyperparathyroidism- generally controlled as OP, 6 of hectorol will be ordered with HD- restart phoslo 5. HTN/volume- good O2 sats on [place] O2- hypotensive on pressors but are weaning down- baseline BP reportedly in the 80's to 90's- add back midodrine for BP support was on as OP 6. GPC bacteremia- either from femoral PC vs new thigh avg- now on zosyn and vanc- i agree if turns out to be CNS may be contaminent   Latisha Lasch A   02/16/2013,8:18 AM  LOS: 3 days

## 2013-02-16 NOTE — Procedures (Signed)
Patient was seen on dialysis and  the procedure was supervised.  BFR 250 (just starting)   Via PC BP is  74/40.   Patient appears to be tolerating treatment well- up and alert- has chronically low BP  [name] A 02/16/2013

## 2013-02-16 NOTE — Op Note (Signed)
[facility] [address] [place], [address]   ENDOSCOPY PROCEDURE REPORT  PATIENT: [name], [name]  MR#: [id number] BIRTHDATE: [birth year] , 62  yrs. old GENDER: Male ENDOSCOPIST:Haylie Mccutcheon Randa Evens, MD REFERRED BY:  CCM PROCEDURE DATE:  02/16/2013 PROCEDURE:   EGD and biopsy ASA CLASS:    class 4 INDICATIONS:   admitted with hematemesis and has been hypotensive. Hemoglobin has been relatively stable. MEDICATION:    fentanyl 12.5 mcg, versed 2 milligrams IV TOPICAL ANESTHETIC:    cetacaine spray  DESCRIPTION OF PROCEDURE:   The procedure had been discuss by phone with the patient's sister and POA Colon Flattery. Consent was obtained. The Pentax adult upper endoscope was inserted blindly into the esophagus with swallowing. The esophagus was carefully examined. There was no active bleeding. There was a widely patent GE junction with no esophagitis, ulceration or esophageal varices. The stomach was entered and examined in the forward and retroflex view. There was no sign of active or recent bleeding. The pyloric channel was normal. The scope was passed under the duodenum in the duodenum was seen well down to the 2nd portion. The scope was a strong back into the bulb. Along the posterior inferior border of the bulb was marked modularity without frank ulceration. There was no active bleeding. This area was biopsied. The scope was withdrawn and the initial findings were confirmed. The patient tolerated the procedure well and was monitored by the ICU staff through throughout the procedure.     COMPLICATIONS: None  ENDOSCOPIC IMPRESSION: 1. G.I. bleeding with history of hematemesis. No gross bleeding scene in the upper G.I. tract. 2. Modularity of the duodenal bulb probably insignificant  RECOMMENDATIONS: 1. Will resume diet and would continue PPI therapy.   _______________________________ Rosalie DoctorCarman Ching, MD 02/16/2013 12:25 PM

## 2013-02-16 NOTE — Progress Notes (Signed)
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 pylori+2009, seizure disorder, and hyperlipidemia presents with severe lactic acidosis and hypotension. He reports having coffee ground emesis and blood in stools.   Lines / Drains: Right EJ 4/27>> Right femoral HD catheter>> Left AV graft 4/25>>  Cultures: Blood cultures 4/27>> 1/2 Gram positive cocci in clusters  Antibiotics: Vanc 4/27>>4/28 Vanc 4/29>> Zosyn 4/27>>  Tests / Events: 02/13/2013 admission in the emergency room with hypotension 02/16/2013: off pressors 02/16/13: EGD scheduled    Overnight Events: No acute events overnights  Vital Signs: Temp:  [97.6 F (36.4 C)-98.3 F (36.8 C)] 98.1 F (36.7 C) (04/30 0731) Pulse Rate:  [82-115] 88 (04/30 0600) Resp:  [8-24] 16 (04/30 0500) BP: (49-91)/(32-49) 74/45 mmHg (04/30 0600) SpO2:  [91 %-100 %] 99 % (04/30 0600) I/O last 3 completed shifts: In: 768.2 [P.O.:60; I.V.:508.2; IV Piggyback:200] Out: 1 [Emesis/NG output:1]  Physical Examination: General: Ill-appearing African American male  Neuro: Unable to speak this is a chronic problem  HEENT: Bilateral external jugular lines  Cardiovascular: Regular rate and rhythm normal S1-S2 no S3 or S4  Lungs: Distant breath sounds  Abdomen: Firm abdomen no rebound or guarding  Musculoskeletal: Left AV fistula, no thrill  Skin: Clear   Labs and Imaging:   Basic Metabolic Panel:  Recent Labs Lab 02/15/13 0430 02/16/13 0210  NA 138 139  K 4.3 4.6  CL 99 101  CO2 20 21  GLUCOSE 120* 109*  BUN 57* 69*  CREATININE 10.03* 11.74*  CALCIUM 8.8 8.5  PHOS 6.9* 6.9*    Recent Labs Lab 02/13/13 1322  AMYLASE 248*   CBC:  Recent Labs Lab 02/13/13 0503  02/13/13 0802   02/16/13 0043 02/16/13 0645  WBC 7.0  --  13.3*  < > 7.9 6.6  NEUTROABS 5.8  --  11.3*  --   --   --   HGB 12.1*  < > 13.2  < > 11.8* 11.0*  HCT 36.8*  < > 39.0  < > 34.6* 32.1*  MCV 99.5  --  95.1  < > 94.3 93.0  PLT 78*  --  76*  < > 97* 95*  < > = values in this interval not displayed. Cardiac Enzymes:  Recent Labs Lab 02/13/13 2200 02/16/13 0044 02/16/13 0645  TROPONINI <0.30 <0.30 <0.30   BNP:  Recent Labs Lab 02/13/13 1322  PROBNP 1218.0*   CBG:  Recent Labs Lab 02/15/13 0741 02/15/13 1145 02/15/13 1531 02/15/13 1929 02/16/13 0005 02/16/13 0401  GLUCAP 115* 140* 124* 109* 109* 109*   Coagulation:  Recent Labs Lab 02/13/13 0503  LABPROT 17.4*  INR 1.47     Recent Labs Lab 02/13/13 0644 02/13/13 1322 02/14/13 0830  LATICACIDVEN 9.09*  --  1.0  PROCALCITON  --  37.80  --     Assessment and Plan: PULMONARY  ASSESSMENT:Mild hypoxemia   HCAP vs aspiration with RLL infiltrate, concern for  aspiration PLAN:   Goal O2>92% Trend CXR See ID for abx  CARDIOVASCULAR  ASSESSMENT: Hypovolemic shock with ? GI bleed vs excess volume removal with HD vs septic shock from HACP  ( ? Aspiration) Lactate- 9.9 >1.0 PLAN:  S/p 1 unit of PRBC Titrate pressors with MAP goal >50   RENAL  ASSESSMENT:  ESRD PLAN:   Renal on board Plans HD today - 4/30   GASTROINTESTINAL  ASSESSMENT:  GI bleeding probable upper source. History of varices and gastric ulcers . EGD in 11/2010 for a GI bleed showed maroon blood in the duodenum and black colored fluid in the stomach with NO source identified along with small nonbleeding esophageal varices Stable H and H during this adm- no witnessed episodes of GI bleed.   PLAN:   GI following along! Planning to do bedside EGD today - 4/30 Protonix IV BID  HEMATOLOGIC  ASSESSMENT:  History of anemia. Now presents with ? GI bleed. 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 H and H stable for now.   Best practices / Disposition: -->ICU status under PCCM -->full code -->Heparin for DVT Px -->Protonix for GI Px -->ventilator bundle -->diet -->family updated at bedside  SAWHNEY,MEGHA 02/16/2013, 8:30 AM  The patient is critically ill with multiple organ systems failure and requires high complexity decision making for assessment and support, frequent evaluation and titration of therapies, application of advanced monitoring technologies and extensive interpretation of multiple databases.    Billy Fischer, MD ; [facility] [phone].  After 5:30 PM or weekends, call [phone]

## 2013-02-16 NOTE — Interval H&P Note (Signed)
History and Physical Interval Note:  02/16/2013 11:36 AM  [name]  has presented today for surgery, with the diagnosis of GI bleed  The various methods of treatment have been discussed with the patient and family. After consideration of risks, benefits and other options for treatment, the patient has consented to  Procedure(s) with comments: ESOPHAGOGASTRODUODENOSCOPY (EGD) (N/A) - bedside as a surgical intervention .  The patient's history has been reviewed, patient examined, no change in status, stable for surgery.  I have reviewed the patient's chart and labs.  Questions were answered to the patient's satisfaction.     Solangel Mcmanaway JR,Cristian Grieves L

## 2013-02-16 NOTE — Progress Notes (Signed)
Hypotension   Repeat CBC and obtain cardiac enzymes.

## 2013-02-16 NOTE — Progress Notes (Signed)
Pt doing better more alert Hg stable, large stool old blood. LM for sister Ms Arvilla [place] [phone] to call. Will plan DGD at 11:30l

## 2013-02-16 NOTE — H&P (View-Only) (Signed)
Pt doing better more alert Hg stable, large stool old blood. LM for sister Ms [place] [phone] to call. Will plan DGD at 11:30l 

## 2013-02-16 NOTE — Progress Notes (Signed)
[facility]  Day # 4 vancomycin Day # 4 zosyn  Subjective: No new complaints   Antibiotics:  Anti-infectives   Start     Dose/Rate Route Frequency Ordered Stop   02/15/13 1200  vancomycin (VANCOCIN) 500 mg in sodium chloride 0.9 % 100 mL IVPB     500 mg 100 mL/hr over 60 Minutes Intravenous Every T-Th-Sa (Hemodialysis) 02/13/13 1314     02/14/13 1200  vancomycin (VANCOCIN) 500 mg in sodium chloride 0.9 % 100 mL IVPB  Status:  Discontinued     500 mg 100 mL/hr over 60 Minutes Intravenous Every M-W-F (Hemodialysis) 02/13/13 1030 02/13/13 1314   02/13/13 1400  piperacillin-tazobactam (ZOSYN) IVPB 2.25 g     2.25 g 100 mL/hr over 30 Minutes Intravenous 3 times per day 02/13/13 1030     02/13/13 1100  vancomycin (VANCOCIN) 1,250 mg in sodium chloride 0.9 % 250 mL IVPB     1,250 mg 166.7 mL/hr over 90 Minutes Intravenous  Once 02/13/13 1030 02/13/13 1242   02/13/13 0715  vancomycin (VANCOCIN) IVPB 1000 mg/200 mL premix  Status:  Discontinued     1,000 mg 200 mL/hr over 60 Minutes Intravenous  Once 02/13/13 0704 02/13/13 1020   02/13/13 0715  piperacillin-tazobactam (ZOSYN) IVPB 2.25 g     2.25 g 100 mL/hr over 30 Minutes Intravenous  Once 02/13/13 0704 02/13/13 1030      Medications: Scheduled Meds: . albumin human  25 g Intravenous Q1 Hr x 4  . alteplase  6 mg Intracatheter STAT  . antiseptic oral rinse  15 mL Mouth Rinse BID  . calcium acetate  1,334 mg Oral TID WC  . heparin  3,400 Units Intravenous Once  . insulin aspart  1-3 Units Subcutaneous Q4H  . midodrine  10 mg Oral BID WC  . pantoprazole (PROTONIX) IV  40 mg Intravenous Q12H  . phenytoin (DILANTIN) IV  100 mg Intravenous Q12H  . piperacillin-tazobactam (ZOSYN)  IV  2.25 g Intravenous Q8H  . vancomycin  500 mg Intravenous Q T,Th,Sa-HD   Continuous Infusions: . sodium chloride    . norepinephrine (LEVOPHED) Adult infusion 5 mcg/min (02/16/13 0230)   PRN Meds:.sodium chloride,  sodium chloride, sodium chloride, alteplase, feeding supplement (NEPRO CARB STEADY), heparin, lidocaine (PF), lidocaine-prilocaine, pentafluoroprop-tetrafluoroeth   Objective: Weight change:   Intake/Output Summary (Last 24 hours) at 02/16/13 1649 Last data filed at 02/16/13 1500  Gross per 24 hour  Intake 291.15 ml  Output      1 ml  Net 290.15 ml   Blood pressure 72/40, pulse 107, temperature 97.4 F (36.3 C), temperature source Oral, resp. rate 16, height 5\' 4"  (1.626 m), weight 134 lb 7.7 oz (61 kg), SpO2 97.00%. Temp:  [97.4 F (36.3 C)-98.1 F (36.7 C)] 97.4 F (36.3 C) (04/30 1600) Pulse Rate:  [83-118] 107 (04/30 1635) Resp:  [8-26] 16 (04/30 1635) BP: (49-100)/(23-58) 72/40 mmHg (04/30 1630) SpO2:  [91 %-100 %] 97 % (04/30 1635)  Physical Exam: General: Alert and awake,   HEENT: anicteric sclera,EOMI, oropharynx clear and without exudate  CVS Tachycardicrate, normal r, no murmur rubs or gallops  Chest: fairly clear to auscultation bilaterally, no wheezing, rales or rhonchi  Abdomen: soft nontender, nondistended, normal bowel sounds, Extremities: no clubbing or edema noted bilaterally  Skin: right HD cathter is clean, graft site is intact on the left, he has he has right EJ line  Neuro: nonfocal,   Lymph: no new lymphadenopathy Neuro: nonfocal  Lab Results:  Recent Labs  02/16/13 0043 02/16/13 0645  WBC 7.9 6.6  HGB 11.8* 11.0*  HCT 34.6* 32.1*  PLT 97* 95*    BMET  Recent Labs  02/15/13 0430 02/16/13 0210  NA 138 139  K 4.3 4.6  CL 99 101  CO2 20 21  GLUCOSE 120* 109*  BUN 57* 69*  CREATININE 10.03* 11.74*  CALCIUM 8.8 8.5    Micro Results: Recent Results (from the past 240 hour(s))  CULTURE, BLOOD (ROUTINE X 2)     Status: None   Collection Time    02/13/13  8:02 AM      Result Value Range Status   Specimen Description BLOOD   Final   Special Requests BOTTLES DRAWN AEROBIC ONLY LEFT EJ 10CC   Final   Culture  Setup Time 02/13/2013  18:00   Final   Culture     Final   Value:        BLOOD CULTURE RECEIVED NO GROWTH TO DATE CULTURE WILL BE HELD FOR 5 DAYS BEFORE ISSUING A FINAL NEGATIVE REPORT   Report Status PENDING   Incomplete  CULTURE, BLOOD (ROUTINE X 2)     Status: None   Collection Time    02/13/13  8:19 AM      Result Value Range Status   Specimen Description BLOOD LEFT HAND   Final   Special Requests BOTTLES DRAWN AEROBIC ONLY 3CC   Final   Culture  Setup Time 02/13/2013 18:00   Final   Culture     Final   Value: STAPHYLOCOCCUS SPECIES (COAGULASE NEGATIVE)     Note: Gram Stain Report Called to,Read Back By and Verified With: JOY HOLLAND ON 02/15/2013 AT 2:46A BY WILEJ   Report Status PENDING   Incomplete  MRSA PCR SCREENING     Status: None   Collection Time    02/13/13  9:48 AM      Result Value Range Status   MRSA by PCR NEGATIVE  NEGATIVE Final   Comment:            The GeneXpert MRSA Assay (FDA     approved for NASAL specimens     only), is one component of a     comprehensive MRSA colonization     surveillance program. It is not     intended to diagnose MRSA     infection nor to guide or     monitor treatment for     MRSA infections.    Studies/Results: Dg Chest Port 1 View  02/16/2013  *RADIOLOGY REPORT*  Clinical Data: Shortness of breath.  PORTABLE CHEST - 1 VIEW  Comparison: 02/14/2013.  Findings: Central line enters from below diaphragm with the tip at the level of the superior aspect of the right atrium/caval atrial junction.  Slight improved aeration right lung base.  Mild elevation right hemidiaphragm.  Heart size within normal limits.  Calcified aorta.  No gross pneumothorax.  Mild central pulmonary vascular prominence without pulmonary edema.  IMPRESSION: Interval slight improved aeration right lung base.   Original Report Authenticated By: Lacy Duverney, M.D.       Assessment/Plan: [name] is a 62 y.o. male with with mx medical problems admitted to ICU with shock, lactic  acidosis thought to be suffering sig GIB with coffee ground emesis, blood per rectum, admitted and started on a myosin and Zosyn after admission blood cultures were obtained. One of his 2 blood cultures now growing gram-positive cocci in clusters. Admission  chest x-ray showed bibasilar infiltrates.   #1 Coag negative staph in 1/2 cultures = Contaminant  #2 Sepsis? Could also be due to other causes besides infection.   At present I would continue his current antibiotics, vancomycin and zosyn for 7 days (could consider lengthen the coverage with zosyn to 10 for aspiration course) and then would dc them  I do not see strong need to look for other potential sources at this point in time, esp as this pt has other potential causes of his hypotension and per Nephrology frequently has episodes of hypotension. If one were to investigate further would get CT chest abdomen and pelvis with oral and IV contrast    #3 Screening: would check HIV  I will sign off for now  Please call with further questions.   LOS: 3 days   Acey Lav 02/16/2013, 4:49 PM

## 2013-02-17 ENCOUNTER — Encounter (HOSPITAL_COMMUNITY): Payer: Self-pay | Admitting: Gastroenterology

## 2013-02-17 DIAGNOSIS — J189 Pneumonia, unspecified organism: Secondary | ICD-10-CM

## 2013-02-17 LAB — CBC
HCT: 34.2 % — ABNORMAL LOW (ref 39.0–52.0)
MCH: 31.8 pg (ref 26.0–34.0)
MCHC: 33.9 g/dL (ref 30.0–36.0)
MCV: 93.7 fL (ref 78.0–100.0)
Platelets: 110 10*3/uL — ABNORMAL LOW (ref 150–400)
RDW: 15.6 % — ABNORMAL HIGH (ref 11.5–15.5)
WBC: 5.8 10*3/uL (ref 4.0–10.5)

## 2013-02-17 LAB — CULTURE, BLOOD (ROUTINE X 2)

## 2013-02-17 LAB — GLUCOSE, CAPILLARY
Glucose-Capillary: 113 mg/dL — ABNORMAL HIGH (ref 70–99)
Glucose-Capillary: 137 mg/dL — ABNORMAL HIGH (ref 70–99)

## 2013-02-17 LAB — RENAL FUNCTION PANEL
Albumin: 3.2 g/dL — ABNORMAL LOW (ref 3.5–5.2)
BUN: 45 mg/dL — ABNORMAL HIGH (ref 6–23)
Calcium: 9.7 mg/dL (ref 8.4–10.5)
Creatinine, Ser: 7.76 mg/dL — ABNORMAL HIGH (ref 0.50–1.35)
GFR calc non Af Amer: 7 mL/min — ABNORMAL LOW (ref >90)
Phosphorus: 5.5 mg/dL — ABNORMAL HIGH (ref 2.3–4.6)

## 2013-02-17 MED ORDER — PHENYTOIN SODIUM EXTENDED 100 MG PO CAPS: 200.0000 mg | ORAL_CAPSULE | Freq: Every day | ORAL | Status: DC

## 2013-02-17 MED ORDER — AMOXICILLIN-POT CLAVULANATE 875-125 MG PO TABS
1.0000 | ORAL_TABLET | Freq: Two times a day (BID) | ORAL | Status: DC
  Administered 2013-02-18 – 2013-02-20 (×5): 1 via ORAL
  Filled 2013-02-17 (×7): qty 1

## 2013-02-17 MED ORDER — PHENYTOIN SODIUM EXTENDED 100 MG PO CAPS
200.0000 mg | ORAL_CAPSULE | Freq: Every day | ORAL | Status: DC
  Administered 2013-02-17 – 2013-02-19 (×3): 200 mg via ORAL
  Filled 2013-02-17 (×4): qty 2

## 2013-02-17 MED ORDER — AMOXICILLIN-POT CLAVULANATE 875-125 MG PO TABS
1.0000 | ORAL_TABLET | Freq: Two times a day (BID) | ORAL | Status: DC
  Filled 2013-02-17 (×2): qty 1

## 2013-02-17 MED ORDER — MIDODRINE HCL 5 MG PO TABS
15.0000 mg | ORAL_TABLET | Freq: Three times a day (TID) | ORAL | Status: DC
  Administered 2013-02-18 – 2013-02-20 (×6): 15 mg via ORAL
  Filled 2013-02-17 (×10): qty 3

## 2013-02-17 MED ORDER — ALBUMIN HUMAN 25 % IV SOLN
25.0000 g | INTRAVENOUS | Status: AC
  Administered 2013-02-17 (×2): 25 g via INTRAVENOUS

## 2013-02-17 MED ORDER — PANTOPRAZOLE SODIUM 40 MG PO TBEC
40.0000 mg | DELAYED_RELEASE_TABLET | Freq: Every day | ORAL | Status: DC
  Administered 2013-02-18 – 2013-02-20 (×3): 40 mg via ORAL
  Filled 2013-02-17 (×3): qty 1

## 2013-02-17 NOTE — Progress Notes (Signed)
Subjective:  Events noted- pt got HD last night- tolerated pretty well, able to remove 1.5 liters- PC was a little sluggish (no heparin)- used TPA.    Objective Vital signs in last 24 hours: Filed Vitals:   02/17/13 0359 02/17/13 0400 02/17/13 0500 02/17/13 0600  BP:  88/48 74/44 84/47   Pulse:  103 93 101  Temp: 98.1 F (36.7 C)     TempSrc: Oral     Resp:  17 17 17   Height:      Weight:   60.9 kg (134 lb 4.2 oz)   SpO2:  96% 92% 95%   Weight change:   Intake/Output Summary (Last 24 hours) at 02/17/13 0718 Last data filed at 02/17/13 0536  Gross per 24 hour  Intake    820 ml  Output   1509 ml  Net   -689 ml   Labs: Basic Metabolic Panel:  Recent Labs Lab 02/14/13 0825 02/15/13 0430 02/16/13 0210  NA 136 138 139  K 4.4 4.3 4.6  CL 98 99 101  CO2 23 20 21   GLUCOSE 163* 120* 109*  BUN 47* 57* 69*  CREATININE 8.30* 10.03* 11.74*  CALCIUM 9.3 8.8 8.5  PHOS  --  6.9* 6.9*   Liver Function Tests:  Recent Labs Lab 02/15/13 0430 02/16/13 0210  ALBUMIN 2.5* 2.4*    Recent Labs Lab 02/13/13 1322  AMYLASE 248*   No results found for this basename: AMMONIA,  in the last 168 hours CBC:  Recent Labs Lab 02/13/13 0503  02/13/13 0802 02/13/13 1322 02/14/13 0825 02/15/13 0430 02/16/13 0043 02/16/13 0645  WBC 7.0  --  13.3* 13.9* 9.2 8.2 7.9 6.6  NEUTROABS 5.8  --  11.3*  --   --   --   --   --   HGB 12.1*  < > 13.2 14.9 13.7 12.3* 11.8* 11.0*  HCT 36.8*  < > 39.0 42.7 40.3 35.5* 34.6* 32.1*  MCV 99.5  --  95.1 91.8 92.6 93.9 94.3 93.0  PLT 78*  --  76* 77* 88* 100* 97* 95*  < > = values in this interval not displayed. Cardiac Enzymes:  Recent Labs Lab 02/13/13 1610 02/13/13 2200 02/16/13 0044 02/16/13 0645 02/16/13 1244  TROPONINI <0.30 <0.30 <0.30 <0.30 <0.30   CBG:  Recent Labs Lab 02/16/13 1617 02/16/13 1941 02/16/13 2019 02/17/13 0014 02/17/13 0356  GLUCAP 83 65* 70 137* 113*    Iron Studies: No results found for this basename:  IRON, TIBC, TRANSFERRIN, FERRITIN,  in the last 72 hours Studies/Results: Dg Chest Port 1 View  02/16/2013  *RADIOLOGY REPORT*  Clinical Data: Shortness of breath.  PORTABLE CHEST - 1 VIEW  Comparison: 02/14/2013.  Findings: Central line enters from below diaphragm with the tip at the level of the superior aspect of the right atrium/caval atrial junction.  Slight improved aeration right lung base.  Mild elevation right hemidiaphragm.  Heart size within normal limits.  Calcified aorta.  No gross pneumothorax.  Mild central pulmonary vascular prominence without pulmonary edema.  IMPRESSION: Interval slight improved aeration right lung base.   Original Report Authenticated By: Lacy Duverney, M.D.    Medications: Infusions: . sodium chloride    . norepinephrine (LEVOPHED) Adult infusion 5 mcg/min (02/16/13 0230)    Scheduled Medications: . antiseptic oral rinse  15 mL Mouth Rinse BID  . calcium acetate  1,334 mg Oral TID WC  . heparin  3,400 Units Intravenous Once  . insulin aspart  1-3 Units Subcutaneous Q4H  .  midodrine  10 mg Oral BID WC  . pantoprazole (PROTONIX) IV  40 mg Intravenous Q12H  . phenytoin (DILANTIN) IV  100 mg Intravenous Q12H  . piperacillin-tazobactam (ZOSYN)  IV  2.25 g Intravenous Q8H  . vancomycin  500 mg Intravenous Q T,Th,Sa-HD    have reviewed scheduled and prn medications.  Physical Exam: General: alert, nonverbal Heart: RRR Lungs: mostly clear Abdomen: soft, non tender Extremities: minimal edema Dialysis Access: femoral PC and new left thigh AVG- good bruit   I Assessment/ Plan: Pt is a 62 y.o. yo male who was admitted on 02/13/2013 with  Acute GIB after placement of thigh AVG on 4/24 Assessment/Plan: 1. GIB- coffee ground emesis - on protonix- GI on board- slow decrease in hgb, EGD did not reveal obvious bleeding source 2. ESRD - , normally TTS at [facility].  Went from Saturday to Wednesday due to instability- tolerated HD yest well.  Will plan on doing today  again to get back on schedule- I think is stable to go up to HD unit. I can also get labs checked with HD today.  AVG placed last week is patent 3. Anemia- s/p PRBC- hgb falling slowly.  No heparin with HD.  If hgb is less than 11 today will add back aranesp 4. Secondary hyperparathyroidism- generally controlled as OP, 6 of hectorol will be ordered with HD- have restarted phoslo 5. HTN/volume- good O2 sats on [place] O2- hypotensive previously on pressors but now off- baseline BP reportedly in the 80's to 90's- added back midodrine for BP support was on as OP.  Pt has been clinically stable eating with these low BPs  6. GPC bacteremia- one out of two bottles CNS- ID thinks is likiely contam and has rec an endpoint on zosyn and vanc 7. Dispo- maybe time to transfer to floor bed and consider discharge soon if hgb stays stable as pt appears to be at baseline?   Micah Barnier A   02/17/2013,7:18 AM  LOS: 4 days

## 2013-02-17 NOTE — Progress Notes (Signed)
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 pylori+2009, seizure disorder, and hyperlipidemia presents with severe lactic acidosis and hypotension. He reports having coffee ground emesis and blood in stools.   Lines / Drains: Right femoral HD catheter>>  Cultures: Blood cultures 4/27>> 1/2 coag neg staph  Antibiotics: Vanc 4/27>>4/28 Vanc 4/29>> 5/1 Zosyn 4/27>> 5/1 Augmentin 5/1 >>   Tests / Events: 02/13/2013 admission in the emergency room with hypotension 02/16/2013: off pressors 02/16/13: EGD - no acute bleedeing or significant abmormalities 5/1 Transfer to med-surg.   Overnight Events: No acute events overnights  Vital Signs: Temp:  [97.5 F (36.4 C)-98.3 F (36.8 C)] 98.2 F (36.8 C) (05/01 1325) Pulse Rate:  [87-120] 113 (05/01 1700) Resp:  [14-26] 20 (05/01 1700) BP: (56-112)/(28-73) 84/59 mmHg (05/01 1700) SpO2:  [87 %-100 %] 94 % (05/01 1325) Weight:  [60.9 kg (134 lb 4.2 oz)-63.8 kg (140 lb 10.5 oz)] 63.8 kg (140 lb 10.5 oz) (05/01 1325) I/O last 3 completed shifts: In: 1011.2 [P.O.:340; I.V.:221.2; IV Piggyback:450] Out: 1510 [Emesis/NG output:1; Other:1509]  Physical Examination: General: Pleasant  Neuro: mute, no focal deficits  HEENT: WNL Cardiovascular: RRR s M Lungs: clear Abdomen: soft, NT, NABS Ext: no edema   Labs and Imaging:   Basic Metabolic Panel:  Recent Labs Lab 02/16/13 0210 02/17/13 1300  NA 139 139  K 4.6 3.8  CL 101 99  CO2 21 25  GLUCOSE 109* 145*  BUN 69* 45*  CREATININE 11.74* 7.76*  CALCIUM 8.5 9.7  PHOS 6.9* 5.5*    Recent Labs Lab 02/13/13 1322  AMYLASE 248*   CBC:  Recent Labs Lab 02/13/13 0503  02/13/13 0802  02/16/13 0645 02/17/13 1300  WBC 7.0  --  13.3*  < >  6.6 5.8  NEUTROABS 5.8  --  11.3*  --   --   --   HGB 12.1*  < > 13.2  < > 11.0* 11.6*  HCT 36.8*  < > 39.0  < > 32.1* 34.2*  MCV 99.5  --  95.1  < > 93.0 93.7  PLT 78*  --  76*  < > 95* 110*  < > = values in this interval not displayed. Cardiac Enzymes:  Recent Labs Lab 02/16/13 0044 02/16/13 0645 02/16/13 1244  TROPONINI <0.30 <0.30 <0.30   BNP:  Recent Labs Lab 02/13/13 1322  PROBNP 1218.0*   CBG:  Recent Labs Lab 02/16/13 1941 02/16/13 2019 02/17/13 0014 02/17/13 0356 02/17/13 0748 02/17/13 1203  GLUCAP 65* 70 137* 113* 115* 111*   Coagulation:  Recent Labs Lab 02/13/13 0503  LABPROT 17.4*  INR 1.47     Recent Labs Lab 02/13/13 0644 02/13/13 1322 02/14/13 0830  LATICACIDVEN 9.09*  --  1.0  PROCALCITON  --  37.80  --    No new CXR  Assessment and Plan: PULMONARY  ASSESSMENT: Mild hypoxemia  HCAP vs aspiration with RLL infiltrate PLAN:   Goal O2>92% Trend CXR See ID for abx  CARDIOVASCULAR  ASSESSMENT: Hypovolemic shock due to prolonged poor PO intake and cont volume removal with HD, resolved  PLAN:  Monitor BP   RENAL  ASSESSMENT:  ESRD PLAN:   Mgmt per HD   GASTROINTESTINAL  ASSESSMENT:   UGIB on admission, resolved EGD negative 4/20  PLAN:   Change PPI to PO  HEMATOLOGIC  ASSESSMENT:  History of anemia. Now presents with ? GI bleed. H and H stable PLAN:  Trend CBC  INFECTIOUS  ASSESSMENT:   RLL inflitrate - suspected PNA Elevated PCT without other likely source PLAN:   Change abx to augmentin Recheck PCT 5/2 Would complete 7-10 days abx total   ENDOCRINE  ASSESSMENT:  Hyperglycemia, resolved  PLAN:   D/C ICU hyperglycemic protocol  NEUROLOGIC  ASSESSMENT:   H/O cognitive impairment with muteness Seizure D/o PLAN:   Change phenytoin back to PO   Transfer to med-surg (6700 preferred) and TRH to assume care as of 5/2. PCCM will sign off. Please call if we can be of further  assistance   Billy Fischer, MD ; [facility] [phone].  After 5:30 PM or weekends, call [phone]

## 2013-02-17 NOTE — Progress Notes (Signed)
[facility] PROGRESS NOTE Subjective Pt w/o gross bleeding. Denies pain per family is eating well.  Objective: Vital signs in last 24 hours: Temp:  [97.4 F (36.3 C)-98.1 F (36.7 C)] 97.5 F (36.4 C) (05/01 0749) Pulse Rate:  [87-118] 92 (05/01 1045) Resp:  [14-26] 18 (05/01 1045) BP: (56-111)/(23-73) 100/51 mmHg (05/01 1000) SpO2:  [87 %-100 %] 94 % (05/01 1045) Weight:  [60.9 kg (134 lb 4.2 oz)] 60.9 kg (134 lb 4.2 oz) (05/01 0500) Last BM Date: 02/17/13  Intake/Output from previous day: 04/30 0701 - 05/01 0700 In: 820 [P.O.:340; I.V.:80; IV Piggyback:400] Out: 1509  Intake/Output this shift:    PE: General--alert pointing at TV and grunting  Abdomen--soft and nontender  Lab Results:  Recent Labs  02/15/13 0430 02/16/13 0043 02/16/13 0645  WBC 8.2 7.9 6.6  HGB 12.3* 11.8* 11.0*  HCT 35.5* 34.6* 32.1*  PLT 100* 97* 95*   BMET  Recent Labs  02/15/13 0430 02/16/13 0210  NA 138 139  K 4.3 4.6  CL 99 101  CO2 20 21  CREATININE 10.03* 11.74*   LFT No results found for this basename: PROT, AST, ALT, ALKPHOS, BILITOT, BILIDIR, IBILI,  in the last 72 hours PT/INR No results found for this basename: LABPROT, INR,  in the last 72 hours PANCREAS No results found for this basename: LIPASE,  in the last 72 hours       Studies/Results: Dg Chest Port 1 View  02/16/2013  *RADIOLOGY REPORT*  Clinical Data: Shortness of breath.  PORTABLE CHEST - 1 VIEW  Comparison: 02/14/2013.  Findings: Central line enters from below diaphragm with the tip at the level of the superior aspect of the right atrium/caval atrial junction.  Slight improved aeration right lung base.  Mild elevation right hemidiaphragm.  Heart size within normal limits.  Calcified aorta.  No gross pneumothorax.  Mild central pulmonary vascular prominence without pulmonary edema.  IMPRESSION: Interval slight improved aeration right lung base.   Original Report Authenticated By: Lacy Duverney, M.D.      Medications: I have reviewed the patient's current medications.  Assessment/Plan: 1. GI Bleed, history of hematemesis. Hg stable don't feel GI bleed was cause of decreased BP I will follow on biopsy result of the duodenal biopsies but suspect that are just enlarge Brunner's glands not significant Would just keep on PPI therapy, please call us back if needed.   Ariel Dimitri JR,Kiona Blume L 02/17/2013, 11:42 AM

## 2013-02-18 ENCOUNTER — Inpatient Hospital Stay (HOSPITAL_COMMUNITY): Payer: Medicare Other

## 2013-02-18 NOTE — Progress Notes (Signed)
[facility] [employer] came to try to draw patient's  blood but patient refused,keeping both hands under the covers. [name] Liszewski Joselita,RN

## 2013-02-18 NOTE — Progress Notes (Addendum)
[facility] PROGRESS NOTE  [name] ZOX:[id number] DOB: 18-Mar-1951 DOA: 02/13/2013 PCP: Janalyn Harder, MD  Assessment/Plan: Principal Problem:   Shock Active Problems:   ANEMIA-IRON DEFICIENCY   ESRD (end stage renal disease)   End stage renal disease   GI bleed   TRH to assume care as of 5/2    GI Bleed, coffee-ground emesis Treated with octreotide and Protonix drip history of hematemesis. Hg stable don't feel GI bleed was cause of decreased BP, EGD ON 4/20, duodenal biopsies pending but suspect that are just enlarge Brunner's glands not significant continue PPI  ESRD - , normally TTS at [facility] AVG placed last week is patent   Hypovolemic shock, blood pressure continues to be low monitor added back midodrine for BP support was on as OP    Pneumonia status post treatment with vancomycin and Zosyn Change abx to augmentin  Recheck PCT 5/2  Would complete 7-10 days abx total  Seizure disorder on oral Dilantin    Code Status: full Family Communication: family updated about patient's clinical progress called to update his at the he is stable [phone]  Disposition Plan:  As above    Brief narrative: 62 year old African 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 pylori+2009, seizure disorder, and hyperlipidemia presents with severe lactic acidosis and hypotension. He reports having coffee ground emesis and blood in stools.   Consultants:  Critical care  Gastroenterology  Nephrology  Procedures:  None  Antibiotics:  Augmentin  HPI/Subjective: Blood pressure continues to be low afebrile overnight  Objective: Filed Vitals:   02/17/13 1744 02/17/13 1907 02/17/13 2041 02/18/13 0512  BP: 85/48 83/50 99/41  91/37  Pulse: 110 96 109 110  Temp:  98.7 F (37.1 C) 98.2 F (36.8 C) 98.4 F (36.9 C)  TempSrc:   Oral Oral  Resp: 18 18 18 20   Height:       Weight: 61.8 kg (136 lb 3.9 oz)     SpO2: 98% 100% 100% 96%    Intake/Output Summary (Last 24 hours) at 02/18/13 0817 Last data filed at 02/18/13 5621  Gross per 24 hour  Intake     60 ml  Output   1815 ml  Net  -1755 ml    Exam:  General: Pleasant  Neuro: mute, no focal deficits  HEENT: WNL  Cardiovascular: RRR s M  Lungs: clear  Abdomen: soft, NT, NABS  Ext: no edema    Data Reviewed: Basic Metabolic Panel:  Recent Labs Lab 02/13/13 0539 02/14/13 0825 02/15/13 0430 02/16/13 0210 02/17/13 1300  NA 140 136 138 139 139  K 4.1 4.4 4.3 4.6 3.8  CL 103 98 99 101 99  CO2  --  23 20 21 25   GLUCOSE 198* 163* 120* 109* 145*  BUN 27* 47* 57* 69* 45*  CREATININE 5.60* 8.30* 10.03* 11.74* 7.76*  CALCIUM  --  9.3 8.8 8.5 9.7  PHOS  --   --  6.9* 6.9* 5.5*    Liver Function Tests:  Recent Labs Lab 02/15/13 0430 02/16/13 0210 02/17/13 1300  ALBUMIN 2.5* 2.4* 3.2*    Recent Labs Lab 02/13/13 1322  AMYLASE 248*   No results found for this basename: AMMONIA,  in the last 168 hours  CBC:  Recent Labs Lab 02/13/13 0503  02/13/13 0802  02/14/13 0825 02/15/13 0430 02/16/13 0043 02/16/13 0645 02/17/13 1300  WBC 7.0  --  13.3*  < >  9.2 8.2 7.9 6.6 5.8  NEUTROABS 5.8  --  11.3*  --   --   --   --   --   --   HGB 12.1*  < > 13.2  < > 13.7 12.3* 11.8* 11.0* 11.6*  HCT 36.8*  < > 39.0  < > 40.3 35.5* 34.6* 32.1* 34.2*  MCV 99.5  --  95.1  < > 92.6 93.9 94.3 93.0 93.7  PLT 78*  --  76*  < > 88* 100* 97* 95* 110*  < > = values in this interval not displayed.  Cardiac Enzymes:  Recent Labs Lab 02/13/13 1610 02/13/13 2200 02/16/13 0044 02/16/13 0645 02/16/13 1244  TROPONINI <0.30 <0.30 <0.30 <0.30 <0.30   BNP (last 3 results)  Recent Labs  02/13/13 1322  PROBNP 1218.0*     CBG:  Recent Labs Lab 02/16/13 2019 02/17/13 0014 02/17/13 0356 02/17/13 0748 02/17/13 1203  GLUCAP 70 137* 113* 115* 111*    Recent Results (from the past 240  hour(s))  CULTURE, BLOOD (ROUTINE X 2)     Status: None   Collection Time    02/13/13  8:02 AM      Result Value Range Status   Specimen Description BLOOD   Final   Special Requests BOTTLES DRAWN AEROBIC ONLY LEFT EJ 10CC   Final   Culture  Setup Time 02/13/2013 18:00   Final   Culture     Final   Value:        BLOOD CULTURE RECEIVED NO GROWTH TO DATE CULTURE WILL BE HELD FOR 5 DAYS BEFORE ISSUING A FINAL NEGATIVE REPORT   Report Status PENDING   Incomplete  CULTURE, BLOOD (ROUTINE X 2)     Status: None   Collection Time    02/13/13  8:19 AM      Result Value Range Status   Specimen Description BLOOD LEFT HAND   Final   Special Requests BOTTLES DRAWN AEROBIC ONLY 3CC   Final   Culture  Setup Time 02/13/2013 18:00   Final   Culture     Final   Value: STAPHYLOCOCCUS SPECIES (COAGULASE NEGATIVE)     Note: THE SIGNIFICANCE OF ISOLATING THIS ORGANISM FROM A SINGLE SET OF BLOOD CULTURES WHEN MULTIPLE SETS ARE DRAWN IS UNCERTAIN. PLEASE NOTIFY THE MICROBIOLOGY DEPARTMENT WITHIN ONE WEEK IF SPECIATION AND SENSITIVITIES ARE REQUIRED.     Note: Gram Stain Report Called to,Read Back By and Verified With: JOY HOLLAND ON 02/15/2013 AT 2:46A BY WILEJ   Report Status 02/17/2013 FINAL   Final  MRSA PCR SCREENING     Status: None   Collection Time    02/13/13  9:48 AM      Result Value Range Status   MRSA by PCR NEGATIVE  NEGATIVE Final   Comment:            The GeneXpert MRSA Assay (FDA     approved for NASAL specimens     only), is one component of a     comprehensive MRSA colonization     surveillance program. It is not     intended to diagnose MRSA     infection nor to guide or     monitor treatment for     MRSA infections.     Studies: Dg Chest 1 View  02/13/2013  *RADIOLOGY REPORT*  Clinical Data: Vomiting.  Evaluate for free air.  CHEST - 1 VIEW  Comparison: 01/13/2013  Findings: Normal heart size and pulmonary vascularity.  Shallow inspiration.  There is increased density in the right  lung base which could represent focal infiltration although there is an EKG lead in this region and this may represent the adhesive pad.  No other evidence of focal consolidation.  No airspace disease.  No blunting of costophrenic angles.  No pneumothorax.  No free air demonstrated in the abdomen or mediastinum.  There is a central venous catheter tip in the right atrium, placed via inferior approach.  IMPRESSION: Increased density in the right lung base may represent focal infiltration versus artifact from the EKG lead.  Stable appearance of the inferior central venous catheter.  No evidence of free air in the chest or abdomen.   Original Report Authenticated By: Burman Nieves, M.D.    Dg Chest Port 1 View  02/18/2013  *RADIOLOGY REPORT*  Clinical Data: Follow up respiratory failure, end-stage renal disease  PORTABLE CHEST - 1 VIEW  Comparison: Portable exam 0618 hours compared to 02/16/2013  Findings: Slight rotation to the left. Normal heart size and pulmonary vascularity. Atherosclerotic calcification aorta. Infradiaphragmatic central venous catheter with tips projecting over right atrium. Peribronchial thickening with minimal bibasilar atelectasis. No pleural effusion or pneumothorax. No acute osseous findings.  IMPRESSION: Minimal bibasilar atelectasis.   Original Report Authenticated By: Ulyses Southward, M.D.    Dg Chest Port 1 View  02/16/2013  *RADIOLOGY REPORT*  Clinical Data: Shortness of breath.  PORTABLE CHEST - 1 VIEW  Comparison: 02/14/2013.  Findings: Central line enters from below diaphragm with the tip at the level of the superior aspect of the right atrium/caval atrial junction.  Slight improved aeration right lung base.  Mild elevation right hemidiaphragm.  Heart size within normal limits.  Calcified aorta.  No gross pneumothorax.  Mild central pulmonary vascular prominence without pulmonary edema.  IMPRESSION: Interval slight improved aeration right lung base.   Original Report Authenticated  By: Lacy Duverney, M.D.    Dg Chest Port 1 View  02/14/2013  *RADIOLOGY REPORT*  Clinical Data: Right lower lobe aspiration pneumonia  PORTABLE CHEST - 1 VIEW  Comparison: 02/13/2013; 01/13/2013  Findings: Grossly unchanged cardiac silhouette and mediastinal contours with atherosclerotic calcifications within the thoracic aorta.  Stable position of support apparatus.  There is persistent mild elevation of the right hemidiaphragm with grossly unchanged right basilar heterogeneous air space opacities.  No new focal airspace opacity.  No definite pleural effusion or pneumothorax. Unchanged bones.  IMPRESSION: Grossly unchanged right basilar airspace opacities worrisome for infection and/or aspiration.  A follow-up chest radiograph in 4 to 6 weeks after treatment is recommended to ensure resolution.   Original Report Authenticated By: Tacey Ruiz, MD     Scheduled Meds: . amoxicillin-clavulanate  1 tablet Oral Q12H  . antiseptic oral rinse  15 mL Mouth Rinse BID  . calcium acetate  1,334 mg Oral TID WC  . heparin  3,400 Units Intravenous Once  . midodrine  15 mg Oral TID WC  . pantoprazole  40 mg Oral Daily  . phenytoin  200 mg Oral QHS   Continuous Infusions:   Principal Problem:   Shock Active Problems:   ANEMIA-IRON DEFICIENCY   ESRD (end stage renal disease)   End stage renal disease   GI bleed    Time spent: 40 minutes   [facility]  [facility] Pager [phone]. If 8PM-8AM, please contact night-coverage at [email or web address], password [facility] 02/18/2013, 8:17 AM  LOS: 5 days

## 2013-02-18 NOTE — Progress Notes (Signed)
Utilization review completed.  

## 2013-02-18 NOTE — Progress Notes (Signed)
[facility] Progress Note  Subjective:   Mentally disable/ mute - no family at bedside.  Per NA, refusing to eat, very particular about whom he will allow to do things. Refused blood draw this am. He was noted to have no issues with eating while in the ICU  Objective Filed Vitals:   02/17/13 1907 02/17/13 2041 02/18/13 0512 02/18/13 1007  BP: 83/50 99/41 91/37  90/45  Pulse: 96 109 110 102  Temp: 98.7 F (37.1 C) 98.2 F (36.8 C) 98.4 F (36.9 C) 98.6 F (37 C)  TempSrc:  Oral Oral Oral  Resp: 18 18 20 20   Height:      Weight:      SpO2: 100% 100% 96% 96%   Physical Exam General: Resting in bed, non-communicative but NAD Heart: Tachy regular Lungs: CTA bilaterally. No wheezes, rales, rhonchi Abdomen: Soft, NT, normal BS Extremities: No pretibial edema. SCD's present Dialysis Access: R thigh PC. New L thigh AVG with + bruit/ clean dry insicion  Dialysis Orders: Center: [facility] on TTS.  EDW 56 kg HD Bath 2K2Ca Time 3 hrs 45 mins Heparin 2400 U. Access Right femoral catheter, left thigh AVG (placed 4/25) BFR 400 DFR A1.5 Hectorol 6 mcg IV/HD Epogen 0 Units IV/HD Venofer 0 Profile 4.    Assessment/Plan: 1. GIB- coffee ground emesis - on protonix for now- GI following- slow decrease in hgb, EGD did not reveal obvious bleeding source. Duodenal biopsies pending. Enlarged Brunner's glands suspected and not significant per GI. 2. ESRD -  TTS at [facility], back on reg schedule. Next HD 5/3. 4K bath. AVG placed 4/25 is patent  3. Anemia- Hgb 11.6 s/p PRBC- No ESA's for now but will resume for hgb < 11. No heparin with HD.   4. Secondary hyperparathyroidism- Ca 9.7 (10.3 corrected) Generally controlled as OP, Continue Hectorol 6 and phoslo 5. HTN/volume- SBPS 80's-90's, good O2 sats on RA - OP dose of midodrine for resumed for BP support. EDW ok. UF 1-2 L.  6. GPC bacteremia- one out of two bottles CNS- ID thinks contamination is likely. Zosyn and vanc d/c'd. On  Augmentin 7. Seizure Disorder - On dilantin per primary  Clydie Braun E. Broadus John, PA-C [facility] Pager [phone] 02/18/2013,11:58 AM  LOS: 5 days   Patient seen and examined, agree with above note with above modifications. Pt really seems back to baseline- hgb stable- for dialysis tomorrow (regular day) Possible discharge soon? Annie Sable, MD 02/18/2013      Additional Objective Labs: Basic Metabolic Panel:  Recent Labs Lab 02/15/13 0430 02/16/13 0210 02/17/13 1300  NA 138 139 139  K 4.3 4.6 3.8  CL 99 101 99  CO2 20 21 25   GLUCOSE 120* 109* 145*  BUN 57* 69* 45*  CREATININE 10.03* 11.74* 7.76*  CALCIUM 8.8 8.5 9.7  PHOS 6.9* 6.9* 5.5*   Liver Function Tests:  Recent Labs Lab 02/15/13 0430 02/16/13 0210 02/17/13 1300  ALBUMIN 2.5* 2.4* 3.2*    Recent Labs Lab 02/13/13 1322  AMYLASE 248*   CBC:  Recent Labs Lab 02/13/13 0503  02/13/13 0802  02/14/13 0825 02/15/13 0430 02/16/13 0043 02/16/13 0645 02/17/13 1300  WBC 7.0  --  13.3*  < > 9.2 8.2 7.9 6.6 5.8  NEUTROABS 5.8  --  11.3*  --   --   --   --   --   --   HGB 12.1*  < > 13.2  < > 13.7 12.3* 11.8* 11.0* 11.6*  HCT 36.8*  < >  39.0  < > 40.3 35.5* 34.6* 32.1* 34.2*  MCV 99.5  --  95.1  < > 92.6 93.9 94.3 93.0 93.7  PLT 78*  --  76*  < > 88* 100* 97* 95* 110*  < > = values in this interval not displayed. Blood Culture    Component Value Date/Time   SDES BLOOD LEFT HAND 02/13/2013 0819   SPECREQUEST BOTTLES DRAWN AEROBIC ONLY 3CC 02/13/2013 0819   CULT  Value: STAPHYLOCOCCUS SPECIES (COAGULASE NEGATIVE) Note: THE SIGNIFICANCE OF ISOLATING THIS ORGANISM FROM A SINGLE SET OF BLOOD CULTURES WHEN MULTIPLE SETS ARE DRAWN IS UNCERTAIN. PLEASE NOTIFY THE MICROBIOLOGY DEPARTMENT WITHIN ONE WEEK IF SPECIATION AND SENSITIVITIES ARE REQUIRED. Note: Gram Stain Report Called to,Read Back By and Verified With: JOY HOLLAND ON 02/15/2013 AT 2:46A BY WILEJ 02/13/2013 0819   REPTSTATUS 02/17/2013 FINAL  02/13/2013 0819    Cardiac Enzymes:  Recent Labs Lab 02/13/13 1610 02/13/13 2200 02/16/13 0044 02/16/13 0645 02/16/13 1244  TROPONINI <0.30 <0.30 <0.30 <0.30 <0.30   CBG:  Recent Labs Lab 02/16/13 2019 02/17/13 0014 02/17/13 0356 02/17/13 0748 02/17/13 1203  GLUCAP 70 137* 113* 115* 111*   Studies/Results: Dg Chest Port 1 View  02/18/2013  *RADIOLOGY REPORT*  Clinical Data: Follow up respiratory failure, end-stage renal disease  PORTABLE CHEST - 1 VIEW  Comparison: Portable exam 0618 hours compared to 02/16/2013  Findings: Slight rotation to the left. Normal heart size and pulmonary vascularity. Atherosclerotic calcification aorta. Infradiaphragmatic central venous catheter with tips projecting over right atrium. Peribronchial thickening with minimal bibasilar atelectasis. No pleural effusion or pneumothorax. No acute osseous findings.  IMPRESSION: Minimal bibasilar atelectasis.   Original Report Authenticated By: Ulyses Southward, M.D.    Medications:   . amoxicillin-clavulanate  1 tablet Oral Q12H  . antiseptic oral rinse  15 mL Mouth Rinse BID  . calcium acetate  1,334 mg Oral TID WC  . heparin  3,400 Units Intravenous Once  . midodrine  15 mg Oral TID WC  . pantoprazole  40 mg Oral Daily  . phenytoin  200 mg Oral QHS

## 2013-02-18 NOTE — Evaluation (Signed)
Physical Therapy Evaluation Patient Details Name: [name] MRN: [id number] DOB: 1951-06-05 Today's Date: 02/18/2013 Time: 1145-1200 PT Time Calculation (min): 15 min  PT Assessment / Plan / Recommendation Clinical Impression  Pt is a 62 y/o male with history or mental retatdation.  Unable to complete PT evaluation secondary to pt resistance to participate.  Discharge disposition will depend on caregiver's ability to encourage pt to participate in mobility.  May need to consider SNF placement.       PT Assessment  Patient needs continued PT services    Follow Up Recommendations  SNF;Supervision/Assistance - 24 hour    Does the patient have the potential to tolerate intense rehabilitation      Barriers to Discharge None      Equipment Recommendations  None recommended by PT    Recommendations for Other Services     Frequency Min 2X/week    Precautions / Restrictions Precautions Precautions: Fall Restrictions Weight Bearing Restrictions: No   Pertinent Vitals/Pain       Mobility  Bed Mobility Bed Mobility: Supine to Sit Supine to Sit: 1: +2 Total assist;HOB flat Supine to Sit: Patient Percentage: 0% Details for Bed Mobility Assistance: attempted to transition to sitting was unsuccessful secondary to pt not following commands and resisting manual facilitation.   Transfers Transfers: Not assessed Ambulation/Gait Ambulation/Gait Assistance: Not tested (comment) Wheelchair Mobility Wheelchair Mobility: No    Exercises     PT Diagnosis: Altered mental status  PT Problem List: Decreased mobility;Decreased cognition;Decreased safety awareness PT Treatment Interventions: Functional mobility training   PT Goals Acute Rehab PT Goals PT Goal Formulation: Patient unable to participate in goal setting Time For Goal Achievement: 03/04/13 Potential to Achieve Goals: Fair Pt will go Supine/Side to Sit: with min assist PT Goal: Supine/Side to Sit - Progress: Goal set  today Pt will Sit at Edge of Bed: with min assist;3-5 min PT Goal: Sit at Edge Of Bed - Progress: Goal set today Pt will go Sit to Supine/Side: with mod assist PT Goal: Sit to Supine/Side - Progress: Goal set today Pt will Transfer Bed to Chair/Chair to Bed: with mod assist PT Transfer Goal: Bed to Chair/Chair to Bed - Progress: Goal set today  Visit Information  Last PT Received On: 02/18/13 Assistance Needed: +2    Subjective Data  Subjective: Pt does not communicate verbally   Prior Functioning  Home Living Lives With: Son Available Help at Discharge: Available 24 hours/day;Personal care attendant Additional Comments: unable to obtain full history secondary pt inability to communicate with speech or writting.   Prior Function Level of Independence: Needs assistance Communication Communication: Expressive difficulties;Receptive difficulties;Other (comment) (Pt is mute)    Cognition  Cognition Arousal/Alertness: Awake/alert Behavior During Therapy: Flat affect Overall Cognitive Status: History of cognitive impairments - at baseline    Extremity/Trunk Assessment Right Upper Extremity Assessment RUE ROM/Strength/Tone: Unable to fully assess Left Upper Extremity Assessment LUE ROM/Strength/Tone: Unable to fully assess Right Lower Extremity Assessment RLE ROM/Strength/Tone: Unable to fully assess Left Lower Extremity Assessment LLE ROM/Strength/Tone: Unable to fully assess   Balance Balance Balance Assessed: No  End of Session PT - End of Session Activity Tolerance: Treatment limited secondary to agitation;Treatment limited secondary to medical complications (Comment) Patient left: in bed;with call bell/phone within reach;with bed alarm set Nurse Communication: Mobility status  GP     Eulalie Speights 02/18/2013, 12:41 PM  Donyale Falcon L. Aniyah Nobis DPT [phone]

## 2013-02-18 NOTE — Progress Notes (Signed)
Patient refused to have labs drawn by Lab Tech,pulls arms away and places under blanket.Advised Lab Tech to try again at a later time. [name] Joselita,RN

## 2013-02-18 NOTE — Evaluation (Signed)
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 Will benefit from acute OT with potential follow up OT once his baseline is determined.    OT Assessment  Patient needs continued OT Services    Follow Up Recommendations   (TBD)    Barriers to Discharge None    Equipment Recommendations   (TBD)       Frequency  Min 2X/week    Precautions / Restrictions Precautions Precautions: Fall Restrictions Weight Bearing Restrictions: No       ADL  ADL Comments: Pt not cooperative during attempt at eval. He would turn and look at me when I talked to him, but when I asked him to do something he would look the other way or resist what I was trying to get him to do. I called his primary caregiver and she plans on being here at 1:00 tomorrow to see if pt will do more with therapy when she is there. Carroll Sage  [phone]     OT Diagnosis: Cognitive deficits  OT Problem List: Decreased cognition OT Treatment Interventions: Self-care/ADL training;Patient/family education;Balance training;Therapeutic activities   OT Goals Acute Rehab OT Goals OT Goal Formulation: With family Time For Goal Achievement: 03/04/13 Potential to Achieve Goals: Fair ADL Goals Pt Will Perform Grooming: with set-up;with supervision;Unsupported;Standing at sink ADL Goal: Grooming - Progress: Goal set today Pt Will Transfer to Toilet: with supervision;Ambulation;Comfort height toilet;3-in-1;Grab bars;Regular height toilet ADL Goal: Toilet Transfer - Progress: Goal set today Miscellaneous OT Goals Miscellaneous OT Goal #1: Pt will be S in and OOB for BADLs. OT Goal: Miscellaneous Goal #1 - Progress: Goal set  today  Visit Information  Last OT Received On: 02/18/13 Assistance Needed: +2    Subjective Data  Subjective: Pt is mute   Prior Functioning     Home Living Lives With:  (personal care attendent) Available Help at Discharge: Available 24 hours/day;Personal care attendant Type of Home: House Home Adaptive Equipment: Shower chair with back Additional Comments: Pt's personal care attendent is with him when he is not at dialysis or at [employer] Prior Function Level of Independence: Needs assistance Needs Assistance: Bathing;Meal Prep;Light Housekeeping;Gait;Transfers;Dressing;Feeding;Grooming;Toileting Bath: Moderate Dressing: Supervision/set-up Feeding: Supervision/set-up Grooming: Supervision/set-up Toileting: Supervision/set-up Meal Prep: Total Light Housekeeping: Total Gait Assistance: S---does not use an AD Transfer Assistance: S Able to Take Stairs?: Yes Driving: No Vocation: On disability Communication Communication:  (per sister he totally understands just does not speak) Dominant Hand:  (Unknown)         Vision/Perception Vision - History Baseline Vision:  (Unknown)   Cognition  Cognition Arousal/Alertness: Awake/alert Behavior During Therapy: Flat affect Overall Cognitive Status: No family/caregiver present to determine baseline cognitive functioning    Extremity/Trunk Assessment Right Upper Extremity Assessment RUE ROM/Strength/Tone: Unable to fully assess;Due to impaired cognition Left Upper Extremity Assessment LUE ROM/Strength/Tone: Unable to fully assess;Due to impaired cognition     Mobility Bed Mobility Details for Bed Mobility Assistance: Pt would not try with me, resisted movements           End of Session OT - End of Session Activity Tolerance:  (Limited due to impaired cognition) Patient left: in bed;with call bell/phone within reach;with bed alarm set  Evette Georges [phone] 02/18/2013, 4:35 PM

## 2013-02-19 LAB — CBC
HCT: 34.1 % — ABNORMAL LOW (ref 39.0–52.0)
MCH: 31.8 pg (ref 26.0–34.0)
MCV: 94.2 fL (ref 78.0–100.0)
Platelets: 166 10*3/uL (ref 150–400)
RBC: 3.62 MIL/uL — ABNORMAL LOW (ref 4.22–5.81)

## 2013-02-19 LAB — RENAL FUNCTION PANEL
BUN: 37 mg/dL — ABNORMAL HIGH (ref 6–23)
CO2: 27 mEq/L (ref 19–32)
Calcium: 9.7 mg/dL (ref 8.4–10.5)
Creatinine, Ser: 7.36 mg/dL — ABNORMAL HIGH (ref 0.50–1.35)
Glucose, Bld: 96 mg/dL (ref 70–99)

## 2013-02-19 NOTE — Progress Notes (Signed)
[facility] PROGRESS NOTE  [name] ZOX:[id number] DOB: 14-Apr-1951 DOA: 02/13/2013 PCP: Janalyn Harder, MD  Assessment/Plan: Principal Problem:   Shock Active Problems:   ANEMIA-IRON DEFICIENCY   ESRD (end stage renal disease)   End stage renal disease   GI bleed   [facility] assumed care as of 5/2  GI Bleed, coffee-ground emesis  Treated with octreotide and Protonix drip  history of hematemesis. Hg stable don't feel GI bleed was cause of decreased BP, EGD ON 4/20, duodenal biopsies pending but suspect that are just enlarge Brunner's glands not significant continue PPI   ESRD - , normally TTS at [facility]  AVG placed last week is patent    Hypovolemic shock, blood pressure continues to be low monitor  added back midodrine for BP support was on as OP blood pressure continues to be low In the 50s systolic this morning, hold off DC    Pneumonia status post treatment with vancomycin and Zosyn  Change abx to augmentin  Recheck PCT 5/2  Would complete 7-10 days abx total    Seizure disorder on oral Dilantin      Code Status: full  Family Communication: family updated about patient's clinical progress called to update his at the he is stable [phone]  Disposition Plan: Likely home with caregiver tomorrow    Brief narrative:  62 year old African 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 pylori+2009, seizure disorder, and hyperlipidemia presents with severe lactic acidosis and hypotension. He reports having coffee ground emesis and blood in stools.  Consultants:  Critical care  Gastroenterology  Nephrology Procedures:  None Antibiotics:  Augmentin HPI/Subjective:  Blood pressure continues to be low, recorded in the 50 systolic this morning   Objective: Filed Vitals:   02/19/13 0620 02/19/13 0627 02/19/13 0700 02/19/13 0730  BP: 87/51 82/57 52/35  82/29  Pulse: 95 96  110 112  Temp: 98 F (36.7 C)     TempSrc: Oral     Resp: 17     Height:      Weight: 60.8 kg (134 lb 0.6 oz)     SpO2: 96%       Intake/Output Summary (Last 24 hours) at 02/19/13 0757 Last data filed at 02/18/13 1700  Gross per 24 hour  Intake     30 ml  Output      0 ml  Net     30 ml    Exam: General: Resting in bed, non-communicative but NAD  Heart: Tachy regular  Lungs: CTA bilaterally. No wheezes, rales, rhonchi  Abdomen: Soft, NT, normal BS  Extremities: No pretibial edema. SCD's present  Dialysis Access: R thigh PC. New L thigh AVG with + bruit/ clean dry insicion       Data Reviewed: Basic Metabolic Panel:  Recent Labs Lab 02/14/13 0825 02/15/13 0430 02/16/13 0210 02/17/13 1300 02/19/13 0635  NA 136 138 139 139 142  K 4.4 4.3 4.6 3.8 3.3*  CL 98 99 101 99 99  CO2 23 20 21 25 27   GLUCOSE 163* 120* 109* 145* 96  BUN 47* 57* 69* 45* 37*  CREATININE 8.30* 10.03* 11.74* 7.76* 7.36*  CALCIUM 9.3 8.8 8.5 9.7 9.7  PHOS  --  6.9* 6.9* 5.5* 5.9*    Liver Function Tests:  Recent Labs Lab 02/15/13 0430 02/16/13 0210 02/17/13 1300 02/19/13 0635  ALBUMIN 2.5* 2.4* 3.2* 3.6    Recent Labs Lab 02/13/13 1322  AMYLASE 248*   No results found for this basename: AMMONIA,  in the last 168 hours  CBC:  Recent Labs Lab 02/13/13 0503  02/13/13 0802  02/15/13 0430 02/16/13 0043 02/16/13 0645 02/17/13 1300 02/19/13 0635  WBC 7.0  --  13.3*  < > 8.2 7.9 6.6 5.8 5.4  NEUTROABS 5.8  --  11.3*  --   --   --   --   --   --   HGB 12.1*  < > 13.2  < > 12.3* 11.8* 11.0* 11.6* 11.5*  HCT 36.8*  < > 39.0  < > 35.5* 34.6* 32.1* 34.2* 34.1*  MCV 99.5  --  95.1  < > 93.9 94.3 93.0 93.7 94.2  PLT 78*  --  76*  < > 100* 97* 95* 110* 166  < > = values in this interval not displayed.  Cardiac Enzymes:  Recent Labs Lab 02/13/13 1610 02/13/13 2200 02/16/13 0044 02/16/13 0645 02/16/13 1244  TROPONINI <0.30 <0.30 <0.30 <0.30 <0.30   BNP (last 3  results)  Recent Labs  02/13/13 1322  PROBNP 1218.0*     CBG:  Recent Labs Lab 02/16/13 2019 02/17/13 0014 02/17/13 0356 02/17/13 0748 02/17/13 1203  GLUCAP 70 137* 113* 115* 111*    Recent Results (from the past 240 hour(s))  CULTURE, BLOOD (ROUTINE X 2)     Status: None   Collection Time    02/13/13  8:02 AM      Result Value Range Status   Specimen Description BLOOD   Final   Special Requests BOTTLES DRAWN AEROBIC ONLY LEFT EJ 10CC   Final   Culture  Setup Time 02/13/2013 18:00   Final   Culture     Final   Value:        BLOOD CULTURE RECEIVED NO GROWTH TO DATE CULTURE WILL BE HELD FOR 5 DAYS BEFORE ISSUING A FINAL NEGATIVE REPORT   Report Status PENDING   Incomplete  CULTURE, BLOOD (ROUTINE X 2)     Status: None   Collection Time    02/13/13  8:19 AM      Result Value Range Status   Specimen Description BLOOD LEFT HAND   Final   Special Requests BOTTLES DRAWN AEROBIC ONLY 3CC   Final   Culture  Setup Time 02/13/2013 18:00   Final   Culture     Final   Value: STAPHYLOCOCCUS SPECIES (COAGULASE NEGATIVE)     Note: THE SIGNIFICANCE OF ISOLATING THIS ORGANISM FROM A SINGLE SET OF BLOOD CULTURES WHEN MULTIPLE SETS ARE DRAWN IS UNCERTAIN. PLEASE NOTIFY THE MICROBIOLOGY DEPARTMENT WITHIN ONE WEEK IF SPECIATION AND SENSITIVITIES ARE REQUIRED.     Note: Gram Stain Report Called to,Read Back By and Verified With: JOY HOLLAND ON 02/15/2013 AT 2:46A BY WILEJ   Report Status 02/17/2013 FINAL   Final  MRSA PCR SCREENING     Status: None   Collection Time    02/13/13  9:48 AM      Result Value Range Status   MRSA by PCR NEGATIVE  NEGATIVE Final   Comment:            The GeneXpert MRSA Assay (FDA     approved for NASAL specimens     only), is one component of a     comprehensive MRSA colonization     surveillance program. It is not     intended to diagnose MRSA     infection nor to guide or  monitor treatment for     MRSA infections.     Studies: Dg Chest 1  View  02/13/2013  *RADIOLOGY REPORT*  Clinical Data: Vomiting.  Evaluate for free air.  CHEST - 1 VIEW  Comparison: 01/13/2013  Findings: Normal heart size and pulmonary vascularity.  Shallow inspiration.  There is increased density in the right lung base which could represent focal infiltration although there is an EKG lead in this region and this may represent the adhesive pad.  No other evidence of focal consolidation.  No airspace disease.  No blunting of costophrenic angles.  No pneumothorax.  No free air demonstrated in the abdomen or mediastinum.  There is a central venous catheter tip in the right atrium, placed via inferior approach.  IMPRESSION: Increased density in the right lung base may represent focal infiltration versus artifact from the EKG lead.  Stable appearance of the inferior central venous catheter.  No evidence of free air in the chest or abdomen.   Original Report Authenticated By: Burman Nieves, M.D.    Dg Chest Port 1 View  02/18/2013  *RADIOLOGY REPORT*  Clinical Data: Follow up respiratory failure, end-stage renal disease  PORTABLE CHEST - 1 VIEW  Comparison: Portable exam 0618 hours compared to 02/16/2013  Findings: Slight rotation to the left. Normal heart size and pulmonary vascularity. Atherosclerotic calcification aorta. Infradiaphragmatic central venous catheter with tips projecting over right atrium. Peribronchial thickening with minimal bibasilar atelectasis. No pleural effusion or pneumothorax. No acute osseous findings.  IMPRESSION: Minimal bibasilar atelectasis.   Original Report Authenticated By: Ulyses Southward, M.D.    Dg Chest Port 1 View  02/16/2013  *RADIOLOGY REPORT*  Clinical Data: Shortness of breath.  PORTABLE CHEST - 1 VIEW  Comparison: 02/14/2013.  Findings: Central line enters from below diaphragm with the tip at the level of the superior aspect of the right atrium/caval atrial junction.  Slight improved aeration right lung base.  Mild elevation right  hemidiaphragm.  Heart size within normal limits.  Calcified aorta.  No gross pneumothorax.  Mild central pulmonary vascular prominence without pulmonary edema.  IMPRESSION: Interval slight improved aeration right lung base.   Original Report Authenticated By: Lacy Duverney, M.D.    Dg Chest Port 1 View  02/14/2013  *RADIOLOGY REPORT*  Clinical Data: Right lower lobe aspiration pneumonia  PORTABLE CHEST - 1 VIEW  Comparison: 02/13/2013; 01/13/2013  Findings: Grossly unchanged cardiac silhouette and mediastinal contours with atherosclerotic calcifications within the thoracic aorta.  Stable position of support apparatus.  There is persistent mild elevation of the right hemidiaphragm with grossly unchanged right basilar heterogeneous air space opacities.  No new focal airspace opacity.  No definite pleural effusion or pneumothorax. Unchanged bones.  IMPRESSION: Grossly unchanged right basilar airspace opacities worrisome for infection and/or aspiration.  A follow-up chest radiograph in 4 to 6 weeks after treatment is recommended to ensure resolution.   Original Report Authenticated By: Tacey Ruiz, MD     Scheduled Meds: . amoxicillin-clavulanate  1 tablet Oral Q12H  . antiseptic oral rinse  15 mL Mouth Rinse BID  . calcium acetate  1,334 mg Oral TID WC  . heparin  3,400 Units Intravenous Once  . midodrine  15 mg Oral TID WC  . pantoprazole  40 mg Oral Daily  . phenytoin  200 mg Oral QHS   Continuous Infusions:   Principal Problem:   Shock Active Problems:   ANEMIA-IRON DEFICIENCY   ESRD (end stage renal disease)   End stage renal disease  GI bleed    Time spent: 40 minutes   [facility]  [facility] Pager [phone]. If 8PM-8AM, please contact night-coverage at [email or web address], password [email or web address] 02/19/2013, 7:57 AM  LOS: 6 days

## 2013-02-19 NOTE — Progress Notes (Signed)
[name] Progress Note  Subjective:   Mentally disable/ mute - seen on HD this AM- alert- pointing at things even though BP reading as very low Objective Filed Vitals:   02/19/13 0700 02/19/13 0730 02/19/13 0800 02/19/13 0830  BP: 52/35 82/29 54/29  69/42  Pulse: 110 112 115 113  Temp:      TempSrc:      Resp:      Height:      Weight:      SpO2:       Physical Exam General: Resting in bed, non-communicative but NAD Heart: Tachy regular Lungs: CTA bilaterally. No wheezes, rales, rhonchi Abdomen: Soft, NT, normal BS Extremities: No pretibial edema. SCD's present Dialysis Access: R thigh PC. New L thigh AVG with + bruit/ clean dry insicion  Dialysis Orders: Center: [facility] on TTS.  EDW 56 kg HD Bath 2K2Ca Time 3 hrs 45 mins Heparin 2400 U. Access Right femoral catheter, left thigh AVG (placed 4/25) BFR 400 DFR A1.5 Hectorol 6 mcg IV/HD Epogen 0 Units IV/HD Venofer 0 Profile 4.    Assessment/Plan: 1. GIB- coffee ground emesis - on protonix for now- GI following- hgb stable, EGD did not reveal obvious bleeding source. Duodenal biopsies pending. Enlarged Brunner's glands suspected and not significant per GI. 2. ESRD -  TTS at [facility], back on reg schedule.  4K bath. Thigh AVG placed 4/25 is patent  3. Anemia- Hgb 11.6 s/p PRBC- No ESA's for now but will resume for hgb < 11. No heparin with HD.   4. Secondary hyperparathyroidism- Ca 9.7 (10.3 corrected) Generally controlled as OP, Continue Hectorol 6 and phoslo 5. HTN/volume- SBPS 80's-90's, good O2 sats on RA - OP dose of midodrine for resumed for BP support. EDW ok. UF 1-2 L.  6. GPC bacteremia- one out of two bottles CNS- ID thinks contamination is likely. Zosyn and vanc d/c'd. On Augmentin 7. Seizure Disorder - On dilantin per primary 8. Dispo- seems to be at baseline, discharge soon?  [name]   02/19/2013,8:56 AM  LOS: 6 days        Additional Objective Labs: Basic Metabolic Panel:  Recent  Labs Lab 02/16/13 0210 02/17/13 1300 02/19/13 0635  NA 139 139 142  K 4.6 3.8 3.3*  CL 101 99 99  CO2 21 25 27   GLUCOSE 109* 145* 96  BUN 69* 45* 37*  CREATININE 11.74* 7.76* 7.36*  CALCIUM 8.5 9.7 9.7  PHOS 6.9* 5.5* 5.9*   Liver Function Tests:  Recent Labs Lab 02/16/13 0210 02/17/13 1300 02/19/13 0635  ALBUMIN 2.4* 3.2* 3.6    Recent Labs Lab 02/13/13 1322  AMYLASE 248*   CBC:  Recent Labs Lab 02/13/13 0503  02/13/13 0802  02/15/13 0430 02/16/13 0043 02/16/13 0645 02/17/13 1300 02/19/13 0635  WBC 7.0  --  13.3*  < > 8.2 7.9 6.6 5.8 5.4  NEUTROABS 5.8  --  11.3*  --   --   --   --   --   --   HGB 12.1*  < > 13.2  < > 12.3* 11.8* 11.0* 11.6* 11.5*  HCT 36.8*  < > 39.0  < > 35.5* 34.6* 32.1* 34.2* 34.1*  MCV 99.5  --  95.1  < > 93.9 94.3 93.0 93.7 94.2  PLT 78*  --  76*  < > 100* 97* 95* 110* 166  < > = values in this interval not displayed. Blood Culture    Component Value Date/Time   SDES BLOOD LEFT  HAND 02/13/2013 0819   SPECREQUEST BOTTLES DRAWN AEROBIC ONLY 3CC 02/13/2013 0819   CULT  Value: STAPHYLOCOCCUS SPECIES (COAGULASE NEGATIVE) Note: THE SIGNIFICANCE OF ISOLATING THIS ORGANISM FROM [name] SINGLE SET OF BLOOD CULTURES WHEN MULTIPLE SETS ARE DRAWN IS UNCERTAIN. PLEASE NOTIFY THE MICROBIOLOGY DEPARTMENT WITHIN ONE WEEK IF SPECIATION AND SENSITIVITIES ARE REQUIRED. Note: Gram Stain Report Called to,Read Back By and Verified With: JOY HOLLAND ON 02/15/2013 AT 2:46A BY WILEJ 02/13/2013 0819   REPTSTATUS 02/17/2013 FINAL 02/13/2013 0819    Cardiac Enzymes:  Recent Labs Lab 02/13/13 1610 02/13/13 2200 02/16/13 0044 02/16/13 0645 02/16/13 1244  TROPONINI <0.30 <0.30 <0.30 <0.30 <0.30   CBG:  Recent Labs Lab 02/16/13 2019 02/17/13 0014 02/17/13 0356 02/17/13 0748 02/17/13 1203  GLUCAP 70 137* 113* 115* 111*   Studies/Results: Dg Chest Port 1 View  02/18/2013  *RADIOLOGY REPORT*  Clinical Data: Follow up respiratory failure, end-stage renal  disease  PORTABLE CHEST - 1 VIEW  Comparison: Portable exam 0618 hours compared to 02/16/2013  Findings: Slight rotation to the left. Normal heart size and pulmonary vascularity. Atherosclerotic calcification aorta. Infradiaphragmatic central venous catheter with tips projecting over right atrium. Peribronchial thickening with minimal bibasilar atelectasis. No pleural effusion or pneumothorax. No acute osseous findings.  IMPRESSION: Minimal bibasilar atelectasis.   Original Report Authenticated By: Ulyses Southward, M.D.    Medications:   . amoxicillin-clavulanate  1 tablet Oral Q12H  . antiseptic oral rinse  15 mL Mouth Rinse BID  . calcium acetate  1,334 mg Oral TID WC  . heparin  3,400 Units Intravenous Once  . midodrine  15 mg Oral TID WC  . pantoprazole  40 mg Oral Daily  . phenytoin  200 mg Oral QHS

## 2013-02-19 NOTE — Progress Notes (Signed)
Physical Therapy Treatment Patient Details Name: [name] MRN: [id number] DOB: 13-Sep-1951 Today's Date: 02/19/2013 Time: 3474-2595 PT Time Calculation (min): 27 min  PT Assessment / Plan / Recommendation Comments on Treatment Session  Pt is more cooperative with certain people and caregiver states that her son is able to work well with Mr. Byers.  Do not feel he is a candidate for continued PT due to decreased compliance with structured PT.  He has a w/c for home use and caregiver does not think there is need for other equipment.  Will sign off from PT    Follow Up Recommendations  No PT follow up     Does the patient have the potential to tolerate intense rehabilitation     Barriers to Discharge        Equipment Recommendations  None recommended by PT    Recommendations for Other Services    Frequency     Plan Discharge plan needs to be updated    Precautions / Restrictions     Pertinent Vitals/Pain unknown    Mobility  Bed Mobility Supine to Sit: 1: +2 Total assist;HOB flat;HOB elevated Details for Bed Mobility Assistance: Pt strongly resisted mobilty attempts by me and caregiver.  He was more compliant with young male nursing tech and allowed to be assist to sit  to stand to transfer to chair wither assist Transfers Transfers: Sit to Stand;Stand to Sit Sit to Stand: 3: Mod assist Stand to Sit: 3: Mod assist Ambulation/Gait Ambulation/Gait Assistance: 3: Mod assist Ambulation Distance (Feet): 2 Feet    Exercises     PT Diagnosis:    PT Problem List:   PT Treatment Interventions:     PT Goals Acute Rehab PT Goals PT Goal Formulation: Patient unable to participate in goal setting Time For Goal Achievement: 03/04/13 Potential to Achieve Goals: Fair Pt will go Supine/Side to Sit: with min assist Pt will Sit at Edge of Bed: with min assist;3-5 min Pt will go Sit to Supine/Side: with mod assist Pt will Transfer Bed to Chair/Chair to Bed: with mod  assist  Visit Information  Last PT Received On: 02/19/13    Subjective Data  Subjective: Caregiver , Jerrye Beavers , here and states that she can will have help at home and can manage pt as he is Patient Stated Goal: Caregiver wants to take pt home as soon as possible   Cognition  Cognition Arousal/Alertness: Awake/alert Behavior During Therapy: Flat affect Overall Cognitive Status: History of cognitive impairments - at baseline (per caregiver)    Balance     End of Session PT - End of Session Activity Tolerance: Treatment limited secondary to agitation Patient left: in chair;with family/visitor present   GP    Rosey Bath K. Ovid, [place] [phone] 02/19/2013, 3:18 PM

## 2013-02-19 NOTE — Procedures (Signed)
Patient was seen on dialysis and the procedure was supervised.  BFR 350  Via PC BP is low but pt alert.   Patient appears to be tolerating treatment well  Armida Vickroy A 02/19/2013

## 2013-02-19 NOTE — Progress Notes (Signed)
Nurse tech approached informed this nurse,that they were unable to get blood pressure from this patient,came to his room to assessessed,patient was sitting in the recliner,tried to get bp on the left forearm it was 82/40,rt arm was 85/43 with hr of 65,patient is asypmtomatic,not on distress,m.d on call made aware.no order received.will monitor.

## 2013-02-20 LAB — CULTURE, BLOOD (ROUTINE X 2)

## 2013-02-20 MED ORDER — AMOXICILLIN-POT CLAVULANATE 875-125 MG PO TABS: 1.0000 | ORAL_TABLET | Freq: Two times a day (BID) | ORAL | Status: DC

## 2013-02-20 MED ORDER — RENA-VITE PO TABS
1.0000 | ORAL_TABLET | Freq: Every day | ORAL | Status: DC
  Filled 2013-02-20: qty 1

## 2013-02-20 MED ORDER — AMOXICILLIN-POT CLAVULANATE 875-125 MG PO TABS: 1.0000 | ORAL_TABLET | Freq: Every morning | ORAL | Status: AC

## 2013-02-20 NOTE — Progress Notes (Signed)
[facility] Progress Note  Subjective:   Mentally disabled/ mute.  Resting in bed. Pointing and grunting some which is usually a good sign that he is at his baseline. Some asymptomatic hypotension overnight and this a.m.  Plan is for discharge today  Objective Filed Vitals:   02/19/13 1653 02/19/13 2006 02/19/13 2100 02/20/13 0621  BP: 85/39  95/52 89/60  Pulse: 96  92 110  Temp: 98.7 F (37.1 C)  97.4 F (36.3 C) 97.3 F (36.3 C)  TempSrc: Oral  Oral Oral  Resp: 16  15 18   Height:  5\' 4"  (1.626 m)    Weight:  55.7 kg (122 lb 12.7 oz)    SpO2: 100%  95% 100%   Physical Exam General: Alert, non-communicative but in NAD Heart: RRR Lungs: CTA bilaterally, no wheezes, rales or rhonchi Abdomen: Soft, NT, normal BS Extremities: No LE edema Dialysis Access: Rt thigh PC, New lt thigh AVG with dressing/ + bruit  Dialysis Orders: Center: [facility] on TTS.  EDW 56 kg HD Bath 2K2Ca Time 3 hrs 45 mins Heparin 2400 U. Access Right femoral catheter, left thigh AVG (placed 4/25) BFR 400 DFR A1.5 Hectorol 6 mcg IV/HD Epogen 0 Units IV/HD Venofer 0 Profile 4.   Assessment/Plan: 1. GIB- coffee ground emesis - on protonix for now- GI following- hgb stable, EGD did not reveal obvious bleeding source. Duodenal biopsies pending -  Enlarged Brunner's glands suspected and not significant per GI. 2. ESRD - TTS at [facility], Next HD 5/6.  4K bath. Thigh AVG placed 4/25 is patent  3. Anemia- Hgb 11.6 s/p PRBC- No ESA's for now but will resume for hgb < 11. No heparin with HD. Repeat CBC in am. No heparin at center for 2 treatments, then tight time 2 treatments , then standard 4. Secondary hyperparathyroidism- Ca 9.7 (10.3 corrected) Generally controlled as OP, Continue Hectorol 6 and phoslo 5. HTN/volume- SBPS 80's-90's, Asymptomatic. Good O2 sats on RA - On OP dose of midodrine TID for  BP support. EDW ok.  6. GPC bacteremia- one out of two bottles CNS- ID thinks contamination is likely. Zosyn and  vanc d/c'd. On Augmentin 7. Nutrition - Alb 3.6 - High protein renal diet, Nepro. Add multivitamin 8. Seizure Disorder - On dilantin per primary 9. Dispo- Discharge today  Scot Jun. Broadus John, PA-C [facility] [phone] - Pager 02/20/2013,8:35 AM  LOS: 7 days   Patient seen and examined, agree with above note with above modifications. Fortunately back to baseline mentally - hgb stable- 1 out of 2 pos blood cultures thought to be contam- on augmentin.  For discharge today.   Annie Sable, MD 02/20/2013      Additional Objective Labs: Basic Metabolic Panel:  Recent Labs Lab 02/16/13 0210 02/17/13 1300 02/19/13 0635  NA 139 139 142  K 4.6 3.8 3.3*  CL 101 99 99  CO2 21 25 27   GLUCOSE 109* 145* 96  BUN 69* 45* 37*  CREATININE 11.74* 7.76* 7.36*  CALCIUM 8.5 9.7 9.7  PHOS 6.9* 5.5* 5.9*   Liver Function Tests:  Recent Labs Lab 02/16/13 0210 02/17/13 1300 02/19/13 0635  ALBUMIN 2.4* 3.2* 3.6    Recent Labs Lab 02/13/13 1322  AMYLASE 248*   CBC:  Recent Labs Lab 02/15/13 0430 02/16/13 0043 02/16/13 0645 02/17/13 1300 02/19/13 0635  WBC 8.2 7.9 6.6 5.8 5.4  HGB 12.3* 11.8* 11.0* 11.6* 11.5*  HCT 35.5* 34.6* 32.1* 34.2* 34.1*  MCV 93.9 94.3 93.0 93.7 94.2  PLT 100* 97* 95* 110* 166   Blood Culture    Component Value Date/Time   SDES BLOOD LEFT HAND 02/13/2013 0819   SPECREQUEST BOTTLES DRAWN AEROBIC ONLY 3CC 02/13/2013 0819   CULT  Value: STAPHYLOCOCCUS SPECIES (COAGULASE NEGATIVE) Note: THE SIGNIFICANCE OF ISOLATING THIS ORGANISM FROM A SINGLE SET OF BLOOD CULTURES WHEN MULTIPLE SETS ARE DRAWN IS UNCERTAIN. PLEASE NOTIFY THE MICROBIOLOGY DEPARTMENT WITHIN ONE WEEK IF SPECIATION AND SENSITIVITIES ARE REQUIRED. Note: Gram Stain Report Called to,Read Back By and Verified With: JOY HOLLAND ON 02/15/2013 AT 2:46A BY WILEJ 02/13/2013 0819   REPTSTATUS 02/17/2013 FINAL 02/13/2013 0819    Cardiac Enzymes:  Recent Labs Lab 02/13/13 1610  02/13/13 2200 02/16/13 0044 02/16/13 0645 02/16/13 1244  TROPONINI <0.30 <0.30 <0.30 <0.30 <0.30   CBG:  Recent Labs Lab 02/17/13 0014 02/17/13 0356 02/17/13 0748 02/17/13 1203 02/19/13 2127  GLUCAP 137* 113* 115* 111* 78   Iron Studies: No results found for this basename: IRON, TIBC, TRANSFERRIN, FERRITIN,  in the last 72 hours @lablastinr3 @ Studies/Results: No results found. Medications:   . amoxicillin-clavulanate  1 tablet Oral Q12H  . antiseptic oral rinse  15 mL Mouth Rinse BID  . calcium acetate  1,334 mg Oral TID WC  . heparin  3,400 Units Intravenous Once  . midodrine  15 mg Oral TID WC  . pantoprazole  40 mg Oral Daily  . phenytoin  200 mg Oral QHS

## 2013-02-20 NOTE — Discharge Summary (Addendum)
Physician Discharge Summary  [name] MRN: [id number] DOB/AGE: August 26, 1951 62 y.o.  PCP: Janalyn Harder, MD   Admit date: 02/13/2013 Discharge date: 02/20/2013  Discharge Diagnoses:      Shock Active Problems:   ANEMIA-IRON DEFICIENCY   ESRD (end stage renal disease)   End stage renal disease   GI bleed     Medication List    TAKE these medications       amoxicillin-clavulanate 875-125 MG per tablet  Commonly known as:  AUGMENTIN  Take 1 tablet by mouth every 12 (twelve) hours.     atorvastatin 20 MG tablet  Commonly known as:  LIPITOR  Take 20 mg by mouth at bedtime.     clonazePAM 0.5 MG tablet  Commonly known as:  KLONOPIN  Take 0.5 tablets (0.25 mg total) by mouth 2 (two) times daily.     folic acid-vitamin b complex-vitamin c-selenium-zinc 3 MG Tabs  Take 1 tablet by mouth daily.     midodrine 10 MG tablet  Commonly known as:  PROAMATINE  Take 15 mg by mouth 3 (three) times daily.     pantoprazole 40 MG tablet  Commonly known as:  PROTONIX  Take 40 mg by mouth daily.     phenytoin 100 MG ER capsule  Commonly known as:  DILANTIN  Take 200 mg by mouth at bedtime.     sevelamer carbonate 800 MG tablet  Commonly known as:  RENVELA  Take 800 mg by mouth 3 (three) times daily with meals.        Discharge Condition: Stable  Disposition: 06-Home-Health Care Svc   Consultants:  Critical care  Gastroenterology  Nephrology   Significant Diagnostic Studies: Dg Chest 1 View  02/13/2013  *RADIOLOGY REPORT*  Clinical Data: Vomiting.  Evaluate for free air.  CHEST - 1 VIEW  Comparison: 01/13/2013  Findings: Normal heart size and pulmonary vascularity.  Shallow inspiration.  There is increased density in the right lung base which could represent focal infiltration although there is an EKG lead in this region and this may represent the adhesive pad.  No other evidence of focal consolidation.  No airspace disease.  No blunting of costophrenic angles.  No  pneumothorax.  No free air demonstrated in the abdomen or mediastinum.  There is a central venous catheter tip in the right atrium, placed via inferior approach.  IMPRESSION: Increased density in the right lung base may represent focal infiltration versus artifact from the EKG lead.  Stable appearance of the inferior central venous catheter.  No evidence of free air in the chest or abdomen.   Original Report Authenticated By: Burman Nieves, M.D.    Dg Chest Port 1 View  02/18/2013  *RADIOLOGY REPORT*  Clinical Data: Follow up respiratory failure, end-stage renal disease  PORTABLE CHEST - 1 VIEW  Comparison: Portable exam 0618 hours compared to 02/16/2013  Findings: Slight rotation to the left. Normal heart size and pulmonary vascularity. Atherosclerotic calcification aorta. Infradiaphragmatic central venous catheter with tips projecting over right atrium. Peribronchial thickening with minimal bibasilar atelectasis. No pleural effusion or pneumothorax. No acute osseous findings.  IMPRESSION: Minimal bibasilar atelectasis.   Original Report Authenticated By: Ulyses Southward, M.D.    Dg Chest Port 1 View  02/16/2013  *RADIOLOGY REPORT*  Clinical Data: Shortness of breath.  PORTABLE CHEST - 1 VIEW  Comparison: 02/14/2013.  Findings: Central line enters from below diaphragm with the tip at the level of the superior aspect of the right atrium/caval atrial junction.  Slight improved aeration right lung base.  Mild elevation right hemidiaphragm.  Heart size within normal limits.  Calcified aorta.  No gross pneumothorax.  Mild central pulmonary vascular prominence without pulmonary edema.  IMPRESSION: Interval slight improved aeration right lung base.   Original Report Authenticated By: Lacy Duverney, M.D.    Dg Chest Port 1 View  02/14/2013  *RADIOLOGY REPORT*  Clinical Data: Right lower lobe aspiration pneumonia  PORTABLE CHEST - 1 VIEW  Comparison: 02/13/2013; 01/13/2013  Findings: Grossly unchanged cardiac silhouette  and mediastinal contours with atherosclerotic calcifications within the thoracic aorta.  Stable position of support apparatus.  There is persistent mild elevation of the right hemidiaphragm with grossly unchanged right basilar heterogeneous air space opacities.  No new focal airspace opacity.  No definite pleural effusion or pneumothorax. Unchanged bones.  IMPRESSION: Grossly unchanged right basilar airspace opacities worrisome for infection and/or aspiration.  A follow-up chest radiograph in 4 to 6 weeks after treatment is recommended to ensure resolution.   Original Report Authenticated By: Tacey Ruiz, MD        Microbiology: Recent Results (from the past 240 hour(s))  CULTURE, BLOOD (ROUTINE X 2)     Status: None   Collection Time    02/13/13  8:02 AM      Result Value Range Status   Specimen Description BLOOD   Final   Special Requests BOTTLES DRAWN AEROBIC ONLY LEFT EJ 10CC   Final   Culture  Setup Time 02/13/2013 18:00   Final   Culture     Final   Value:        BLOOD CULTURE RECEIVED NO GROWTH TO DATE CULTURE WILL BE HELD FOR 5 DAYS BEFORE ISSUING A FINAL NEGATIVE REPORT   Report Status PENDING   Incomplete  CULTURE, BLOOD (ROUTINE X 2)     Status: None   Collection Time    02/13/13  8:19 AM      Result Value Range Status   Specimen Description BLOOD LEFT HAND   Final   Special Requests BOTTLES DRAWN AEROBIC ONLY 3CC   Final   Culture  Setup Time 02/13/2013 18:00   Final   Culture     Final   Value: STAPHYLOCOCCUS SPECIES (COAGULASE NEGATIVE)     Note: THE SIGNIFICANCE OF ISOLATING THIS ORGANISM FROM A SINGLE SET OF BLOOD CULTURES WHEN MULTIPLE SETS ARE DRAWN IS UNCERTAIN. PLEASE NOTIFY THE MICROBIOLOGY DEPARTMENT WITHIN ONE WEEK IF SPECIATION AND SENSITIVITIES ARE REQUIRED.     Note: Gram Stain Report Called to,Read Back By and Verified With: JOY HOLLAND ON 02/15/2013 AT 2:46A BY WILEJ   Report Status 02/17/2013 FINAL   Final  MRSA PCR SCREENING     Status: None   Collection  Time    02/13/13  9:48 AM      Result Value Range Status   MRSA by PCR NEGATIVE  NEGATIVE Final   Comment:            The GeneXpert MRSA Assay (FDA     approved for NASAL specimens     only), is one component of a     comprehensive MRSA colonization     surveillance program. It is not     intended to diagnose MRSA     infection nor to guide or     monitor treatment for     MRSA infections.     Labs: Results for orders placed during the hospital encounter of 02/13/13 (from the past 48 hour(s))  CBC     Status: Abnormal   Collection Time    02/19/13  6:35 AM      Result Value Range   WBC 5.4  4.0 - 10.5 K/uL   RBC 3.62 (*) 4.22 - 5.81 MIL/uL   Hemoglobin 11.5 (*) 13.0 - 17.0 g/dL   HCT 16.1 (*) 09.6 - 04.5 %   MCV 94.2  78.0 - 100.0 fL   MCH 31.8  26.0 - 34.0 pg   MCHC 33.7  30.0 - 36.0 g/dL   RDW 40.9 (*) 81.1 - 91.4 %   Platelets 166  150 - 400 K/uL  RENAL FUNCTION PANEL     Status: Abnormal   Collection Time    02/19/13  6:35 AM      Result Value Range   Sodium 142  135 - 145 mEq/L   Potassium 3.3 (*) 3.5 - 5.1 mEq/L   Chloride 99  96 - 112 mEq/L   CO2 27  19 - 32 mEq/L   Glucose, Bld 96  70 - 99 mg/dL   BUN 37 (*) 6 - 23 mg/dL   Creatinine, Ser 7.82 (*) 0.50 - 1.35 mg/dL   Calcium 9.7  8.4 - 95.6 mg/dL   Phosphorus 5.9 (*) 2.3 - 4.6 mg/dL   Albumin 3.6  3.5 - 5.2 g/dL   GFR calc non Af Amer 7 (*) >90 mL/min   GFR calc Af Amer 8 (*) >90 mL/min   Comment:            The eGFR has been calculated     using the CKD EPI equation.     This calculation has not been     validated in all clinical     situations.     eGFR's persistently     <90 mL/min signify     possible Chronic Kidney Disease.  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 pylori+2009, seizure disorder, and hyperlipidemia presents with severe lactic acidosis and hypotension. He reports having coffee ground emesis and blood in stools.    HOSPITAL COURSE:   #1 GI bleeding coffee ground emesis Initially treated with octreotide and Protonix drip Hemoglobin has been stable EGD ON 4/20, duodenal biopsies pending but suspect that are just enlarge Brunner's glands not significant continue PPI  According to gastroenterology,   GI bleed wasn't the cause of decreased BP   #2 end-stage renal disease, hemodialysis on Tuesday Thursday Saturday  #3 seizure disorder on Dilantin  #4 GPC bacteremia one out of 2 bottles ID thinks contamination is likely. Zosyn and vanc d/c'd. On Augmentin for 7 more days  #5 hypotension Continue midodrine Blood pressure tends to run low in the 80, patient asymptomatic  #6 disposition patient discharged home with caregiver, no PT needs at this time Sister aware  Discharge Exam:   Blood pressure 89/60, pulse 110, temperature 97.3 F (36.3 C), temperature source Oral, resp. rate 18, height 5\' 4"  (1.626 m), weight 55.7 kg (122 lb 12.7 oz), SpO2 100.00%.   General: Resting in bed, non-communicative but NAD  Heart: Tachy regular  Lungs: CTA bilaterally. No wheezes, rales, rhonchi  Abdomen: Soft, NT,  normal BS  Extremities: No pretibial edema. SCD's present  Dialysis Access: R thigh PC. New L thigh AVG with + bruit/ clean dry insicion        Future Appointments Provider Department Dept Phone   02/28/2013 1:00 PM Nada Libman, MD [facility] [phone]   03/02/2013 11:00 AM Nilda Riggs, NP [facility] [phone]   03/30/2013 1:15 PM Linward Headland, MD [facility] [phone]        Signed: Richarda Overlie 02/20/2013, 8:52 AM

## 2013-02-20 NOTE — Progress Notes (Addendum)
02/20/2013 10:34 AM  Went in to assess patient earlier this am.  Pt refused medications twice.  Pt also would not let me turn or move him.  Pt is mute, but does not appear in any sort of distress.  Dr. Susie Cassette notified that patient refused all medications, no further orders received.  Will continue to monitor patient. Eunice Blase

## 2013-02-25 ENCOUNTER — Encounter: Payer: Self-pay | Admitting: Surgery

## 2013-02-28 ENCOUNTER — Ambulatory Visit (INDEPENDENT_AMBULATORY_CARE_PROVIDER_SITE_OTHER): Payer: Medicare Other | Admitting: Surgery

## 2013-02-28 ENCOUNTER — Encounter: Payer: Self-pay | Admitting: Surgery

## 2013-02-28 VITALS — BP 95/52 | HR 91 | Ht 64.0 in | Wt 126.0 lb

## 2013-02-28 DIAGNOSIS — N186 End stage renal disease: Secondary | ICD-10-CM

## 2013-02-28 NOTE — Progress Notes (Signed)
[facility] POST OPERATIVE OFFICE NOTE  CC:  F/u for surgery  HPI:  This is a 62 y.o. male who is s/p left thigh graft placement on 02/11/13 by Dr. Myra Gianotti.  Pt was recently discharged from the hospital and is doing well.  He currently dialyzes through a right femoral diatek.   No Known Allergies  Current Outpatient Prescriptions  Medication Sig Dispense Refill  . atorvastatin (LIPITOR) 20 MG tablet Take 20 mg by mouth at bedtime.       . clonazePAM (KLONOPIN) 0.5 MG tablet Take 0.5 tablets (0.25 mg total) by mouth 2 (two) times daily.  30 tablet  3  . folic acid-vitamin b complex-vitamin c-selenium-zinc (DIALYVITE) 3 MG TABS Take 1 tablet by mouth daily.  30 tablet  11  . midodrine (PROAMATINE) 10 MG tablet Take 15 mg by mouth 3 (three) times daily.       . pantoprazole (PROTONIX) 40 MG tablet Take 40 mg by mouth daily.      . phenytoin (DILANTIN) 100 MG ER capsule Take 200 mg by mouth at bedtime.      . sevelamer carbonate (RENVELA) 800 MG tablet Take 800 mg by mouth 3 (three) times daily with meals.       No current facility-administered medications for this visit.     ROS:  See HPI  Physical Exam:  Filed Vitals:   02/28/13 1256  BP: 95/52  Pulse: 91    Incision:  Small opening of distal groin incision, but with good granulation tissue.  Does not appear infected. Extremities:  + thrill and bruit within the graft.   A/P:  This is a 62 y.o. male here for f/u of left thigh graft placement on 02/11/13.  -graft is patent and may be used in 2 weeks. -silver nitrate applied to small opening of distal incision. -f/u as needed. -once the graft is being used successfully, will be able to remove the right femoral catheter.  Doreatha Massed, PA-C [facility] [phone]  Clinic MD:  Pt seen and examined with Dr. Myra Gianotti  I agree with the above. The patient has been seen and examined. There is a good thrill within his left thigh graft. The  distal aspect of his incision has a small amount of superficial separation. I cauterized this with silver nitrate. I do not anticipate his being a problem. His graft should be able to be cannulated in 2 weeks.  Durene Cal

## 2013-03-02 ENCOUNTER — Ambulatory Visit (INDEPENDENT_AMBULATORY_CARE_PROVIDER_SITE_OTHER): Payer: Medicare Other | Admitting: Nurse Practitioner

## 2013-03-02 ENCOUNTER — Encounter: Payer: Self-pay | Admitting: Nurse Practitioner

## 2013-03-02 VITALS — BP 74/51 | HR 96 | Ht <= 58 in | Wt 125.0 lb

## 2013-03-02 DIAGNOSIS — F79 Unspecified intellectual disabilities: Secondary | ICD-10-CM

## 2013-03-02 DIAGNOSIS — R4701 Aphasia: Secondary | ICD-10-CM

## 2013-03-02 DIAGNOSIS — G40309 Generalized idiopathic epilepsy and epileptic syndromes, not intractable, without status epilepticus: Secondary | ICD-10-CM | POA: Insufficient documentation

## 2013-03-02 MED ORDER — PHENYTOIN SODIUM EXTENDED 100 MG PO CAPS: 200.0000 mg | ORAL_CAPSULE | Freq: Every day | ORAL | Status: DC

## 2013-03-02 NOTE — Progress Notes (Signed)
I have read the note, and I agree with the clinical assessment and plan.  

## 2013-03-02 NOTE — Progress Notes (Signed)
HPI: He returns for followup after initial evaluation with Dr. Anne Hahn 09/01/2012. 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 He has a history of a seizure disorder that began 2-1/2 years ago requiring hospitalization. He was placed on clonazepam  but was never on anticonvulsant medications. He was placed on Dilantin by Dr. Anne Hahn, currently 200 mg at night. The caregiver denies that he has had further jerking activity since being on the medication. EEG was normal. At baseline the patient has 24/ 7 care but he is able to feed himself. He has a baseline gait instability and can fall on occasion, no recent falls. CT of the head done 2010 showed atrophy without acute changes. Patient also has end-stage renal disease and goes to dialysis 3 times a week. Date of last seizure November 2013.  Caregiver denies any staring spells, confusion, sleep disturbances, lapses of time, headache and bowel and bladder incontinence.    ROS:   Neg  Physical Exam General: well developed, well nourished, seated, in no evident distress Head: head normocephalic and atraumatic. Oropharynx benign Neck: supple with no carotid or supraclavicular bruits Cardiovascular: regular rate and rhythm, no murmurs Skin: Dialysis catheter right upper leg  Neurologic Exam Mental Status: Alert but mute. MR. Does not follow commands.  Cranial Nerves: Pupils equal, briskly reactive to light. Bilateral proptosis. Extraocular movements full without nystagmus. Patient blinks to threat. Discs were not evaluated.  Motor: Appears to have good strength,  the patient moves all 4 extremities with good symmetry . Sensory.: Minimal response to deep pain stimulation in all 4 extremities.  Coordination:  Unable to perform  finger-to-nose and heel-to-shin Gait and Station: Arises from chair with assistance, gait is unsteady, patient cannot cooperate for tandem testing or Romberg out difficulty.  Reflexes: Depressed and  symmetric. Toes are neutral bilaterally   ASSESSMENT:  Seizure disorder currently on Dilantin with last seizure being in November 2013 Profound mental retardation Mutism Gait disorder End-stage renal disease on dialysis     PLAN: Patient had recent CBC and BMP 02/18/13. Will get total and free Dilantin level Followup in 6 months and when necessary Call for seizure activity  Nilda Riggs, GNP-BC APRN

## 2013-03-02 NOTE — Patient Instructions (Addendum)
Please continue Dilantin at current dose Will check Dilantin level today Followup in 6 months Call for  seizure activity

## 2013-03-07 ENCOUNTER — Telehealth: Payer: Self-pay | Admitting: Nurse Practitioner

## 2013-03-07 NOTE — Telephone Encounter (Signed)
Please call and ask the patient where did he go to get Dilantin level done. It was not done here at checkout after his visit on 03/02/13.

## 2013-03-07 NOTE — Telephone Encounter (Signed)
Left message with caregiver to let us know where Dilantin level was drawn.

## 2013-03-17 ENCOUNTER — Encounter: Payer: Self-pay | Admitting: Neurology

## 2013-03-24 NOTE — Telephone Encounter (Signed)
I spoke to caregiver and they are going to [facility] outpatient facility next Wed and she will ask about getting his Dilantin level checked.

## 2013-03-30 ENCOUNTER — Ambulatory Visit (INDEPENDENT_AMBULATORY_CARE_PROVIDER_SITE_OTHER): Payer: Medicare Other | Admitting: Internal Medicine

## 2013-03-30 ENCOUNTER — Encounter: Payer: Self-pay | Admitting: Internal Medicine

## 2013-03-30 VITALS — BP 92/57 | HR 93 | Ht 60.0 in | Wt 132.2 lb

## 2013-03-30 DIAGNOSIS — K922 Gastrointestinal hemorrhage, unspecified: Secondary | ICD-10-CM

## 2013-03-30 DIAGNOSIS — B354 Tinea corporis: Secondary | ICD-10-CM | POA: Insufficient documentation

## 2013-03-30 MED ORDER — KETOCONAZOLE 2 % EX CREA: TOPICAL_CREAM | Freq: Every day | CUTANEOUS | Status: AC

## 2013-03-30 NOTE — Patient Instructions (Signed)
General Instructions: Your rash is due to a condition called Tinea Corporis, which is a fungal rash.  Treat the area by applying Ketoconazole cream, once per day, for 2-4 weeks. -the dark area may take up to 1 year to lighten, but this is not a sign of treatment failure  At dialysis, we are requesting that they draw a Complete Blood Count and Dilantin level at his next session, and fax the results to [phone].  Please return for a follow-up visit in 6 months.   Treatment Goals:  Goals (1 Years of Data) as of 03/30/13   None      Progress Toward Treatment Goals:    Self Care Goals & Plans:       Care Management & Community Referrals:  Referral 03/30/2013  Referrals made to community resources none     Fungus Infection of the Skin An infection of your skin caused by a fungus is a very common problem. Treatment depends on which part of the body is affected. Types of fungal skin infection include:  Athlete's Foot(Tinea pedis). This infection starts between the toes and may involve the entire sole and sides of foot. It is the most common fungal disease. It is made worse by heat, moisture, and friction. To treat, wash your feet 2 to 3 times daily. Dry thoroughly between the toes. Use medicated foot powder or cream as directed on the package. Plain talc, cornstarch, or rice powder may be dusted into socks and shoes to keep the feet dry. Wearing footwear that allows ventilation is also helpful.  Ringworm (Tinea corporis and tinea capitis). This infection causes scaly red rings to form on the skin or scalp. For skin sores, apply medicated lotion or cream as directed on the package. For the scalp, medicated shampoo may be used with with other therapies. Ringworm of the scalp or fingernails usually requires using oral medicine for 2 to 4 months.  Tinea versicolor. This infection appears as painless, scaly, patchy areas of discolored skin (whitish to light Amedee Cerrone). It is more common in  the summer and favors oily areas of the skin such as those found at the chest, abdomen, back, pubis, neck, and body folds. It can be treated with medicated shampoo or with medicated topical cream. Oral antifungals may be needed for more active infections. The light and/or dark spots may take time to get better and is not a sign of treatment failure. Fungal infections may need to be treated for several weeks to be cured. It is important not to treat fungal infections with steroids or combination medicine that contains an antifungal and steroid as these will make the fungal infection worse. SEEK MEDICAL CARE IF:   You have persistent itching or rawness.  You have an oral temperature above 102 F (38.9 C). Document Released: 11/13/2004 Document Revised: 12/29/2011 Document Reviewed: 01/29/2010 [facility] Patient Information 2013 [place], [place].

## 2013-03-30 NOTE — Assessment & Plan Note (Signed)
The patient has a scaley patch under his left axilla, consistent with tinea corporis vs tinea versicolor. -treat with ketoconazole cream, daily, x2-4 weeks

## 2013-03-30 NOTE — Assessment & Plan Note (Addendum)
The patient presents for follow-up after a hospitalization for upper GI bleed.  The patient's hemoglobin at discharge was 11.5.  The patient notes no further bleeding.  The patient's family members state that he will only let people draw his blood at dialysis, so we have called the patient's HD center [facility]), to request CBC tomorrow with HD, to be faxed to our office. -will f/u results -continue protonix

## 2013-03-30 NOTE — Progress Notes (Signed)
HPI The patient is a 62 y.o. male with a history of cognitive impairment, ESRD, seizures, presenting for a follow-up visit  The pateint was recently hospitalized 4/27-5/4, for upper GIB with hematemesis.  EGD at the time showed nodularity of the duodenal bulb.  Since discharge, the family notes no bleeding, including no hematemesis, BRBPR, or melena.  The patient's family notes a hyperpigmented patch under his left axilla, which they first noticed 1 week ago when bathing the patient.  The area is non-painful, and has not significantly changed in that time period.  ROS: General: no fevers, chills, changes in weight, changes in appetite Skin: no rash HEENT: no blurry vision, hearing changes, sore throat Pulm: no dyspnea, coughing, wheezing CV: no chest pain, palpitations, shortness of breath Abd: no abdominal pain, nausea/vomiting, diarrhea/constipation GU: no dysuria, hematuria, polyuria Ext: no arthralgias, myalgias Neuro: no weakness, numbness, or tingling  Filed Vitals:   03/30/13 1331  BP: 92/57  Pulse: 93    PEX General: alert, cooperative, and in no apparent distress HEENT: pupils equal round and reactive to light, vision grossly intact, oropharynx clear and non-erythematous  Neck: supple, no lymphadenopathy Lungs: clear to ascultation bilaterally, normal work of respiration, no wheezes, rales, ronchi Heart: regular rate and rhythm, no murmurs, gallops, or rubs Abdomen: soft, non-tender, non-distended, normal bowel sounds. Hyperpigmented patch with mild scale noted on the patient's left lateral side, inferior to his left axilla Extremities: no cyanosis, clubbing, or edema Neurologic: alert & oriented X3, cranial nerves II-XII intact, strength grossly intact, sensation intact to light touch  Current Outpatient Prescriptions on File Prior to Visit  Medication Sig Dispense Refill  . atorvastatin (LIPITOR) 20 MG tablet Take 20 mg by mouth at bedtime.       . clonazePAM  (KLONOPIN) 0.5 MG tablet Take 0.5 tablets (0.25 mg total) by mouth 2 (two) times daily.  30 tablet  3  . folic acid-vitamin b complex-vitamin c-selenium-zinc (DIALYVITE) 3 MG TABS Take 1 tablet by mouth daily.  30 tablet  11  . midodrine (PROAMATINE) 10 MG tablet Take 15 mg by mouth 3 (three) times daily.       . pantoprazole (PROTONIX) 40 MG tablet Take 40 mg by mouth daily.      . phenytoin (DILANTIN) 100 MG ER capsule Take 2 capsules (200 mg total) by mouth at bedtime.  60 capsule  6  . sevelamer carbonate (RENVELA) 800 MG tablet Take 800 mg by mouth 3 (three) times daily with meals.       No current facility-administered medications on file prior to visit.    Assessment/Plan

## 2013-03-31 NOTE — Progress Notes (Signed)
Case discussed with Dr. Brown at the time of visit. We reviewed the resident's history and exam and pertinent patient test results. I agree with the assessment, diagnosis and plan of care documented in the resident's note. 

## 2013-04-05 ENCOUNTER — Other Ambulatory Visit: Payer: Self-pay | Admitting: *Deleted

## 2013-04-05 MED ORDER — CLONAZEPAM 0.5 MG PO TABS: 0.2500 mg | ORAL_TABLET | Freq: Two times a day (BID) | ORAL | Status: DC

## 2013-04-07 NOTE — Telephone Encounter (Signed)
Rx faxed in.

## 2013-04-27 ENCOUNTER — Other Ambulatory Visit: Payer: Self-pay | Admitting: Internal Medicine

## 2013-08-03 ENCOUNTER — Other Ambulatory Visit: Payer: Self-pay | Admitting: *Deleted

## 2013-08-04 MED ORDER — PANTOPRAZOLE SODIUM 40 MG PO TBEC: 40.0000 mg | DELAYED_RELEASE_TABLET | Freq: Every day | ORAL | Status: DC

## 2013-08-25 ENCOUNTER — Other Ambulatory Visit: Payer: Self-pay

## 2013-08-26 ENCOUNTER — Telehealth: Payer: Self-pay | Admitting: *Deleted

## 2013-08-26 NOTE — Telephone Encounter (Signed)
Agree with plan 

## 2013-08-26 NOTE — Telephone Encounter (Signed)
Call from pt's caregiver, Carroll Sage - States pt felled 2 -3 days ago and woke up yesterday morning c/o right side pain. But he was able to go to Dialysis yesterday. Also states he has not been eating well and last BM was 2-3 days ago - she has Colace and Miralax.  She's unable to bring pt in today for an appt d/t needing help w/transportation and assistance. She can bring pt Monday- appt has been scheduled for 0900AM. Instructed if pt's symptoms become worse, take him to UC or ED; she agreed.

## 2013-08-29 ENCOUNTER — Ambulatory Visit (INDEPENDENT_AMBULATORY_CARE_PROVIDER_SITE_OTHER): Payer: Medicare Other | Admitting: Internal Medicine

## 2013-08-29 ENCOUNTER — Ambulatory Visit (HOSPITAL_COMMUNITY)
Admission: RE | Admit: 2013-08-29 | Discharge: 2013-08-29 | Disposition: A | Discharge status: Home / Self Care | Payer: Medicare Other | Source: Ambulatory Visit | Attending: Internal Medicine | Admitting: Internal Medicine

## 2013-08-29 ENCOUNTER — Telehealth: Payer: Self-pay | Admitting: *Deleted

## 2013-08-29 VITALS — BP 111/69 | HR 90 | Temp 96.9°F | Ht 60.0 in | Wt 125.1 lb

## 2013-08-29 DIAGNOSIS — R4701 Aphasia: Secondary | ICD-10-CM

## 2013-08-29 DIAGNOSIS — W19XXXA Unspecified fall, initial encounter: Secondary | ICD-10-CM | POA: Insufficient documentation

## 2013-08-29 DIAGNOSIS — R079 Chest pain, unspecified: Secondary | ICD-10-CM

## 2013-08-29 DIAGNOSIS — K5900 Constipation, unspecified: Secondary | ICD-10-CM

## 2013-08-29 DIAGNOSIS — F79 Unspecified intellectual disabilities: Secondary | ICD-10-CM

## 2013-08-29 DIAGNOSIS — N186 End stage renal disease: Secondary | ICD-10-CM

## 2013-08-29 DIAGNOSIS — R0781 Pleurodynia: Secondary | ICD-10-CM

## 2013-08-29 MED ORDER — SENNOSIDES-DOCUSATE SODIUM 8.6-50 MG PO TABS: 1.0000 | ORAL_TABLET | Freq: Every day | ORAL | Status: DC

## 2013-08-29 MED ORDER — ACETAMINOPHEN 325 MG PO TABS: 650.0000 mg | ORAL_TABLET | Freq: Four times a day (QID) | ORAL | Status: DC | PRN

## 2013-08-29 NOTE — Progress Notes (Signed)
  Subjective:    Patient ID: [name], male    DOB: Nov 23, 1950, 62 y.o.   MRN: [id number]  HPI  Pt with hx significant for epilepsy, mental retardation, mutism and ESRD on hemodialysis who presents with his long-term caregiver. Pt had a mechanical fall about a 1 week ago in dialysis without initial report of injury. He woke up 3 days later screaming out in pain and grabbing his right side.  Caregiver concerned about rib fracture and requesting an XRay as advised by dialysis center. Reports that he has been constipated as well, having gone 3-4 days without a bowel movement.  He did have a hard bowel movement yesterday. Denies seizures or syncope.   Review of Systems  Constitutional: Positive for appetite change. Negative for fever and fatigue.       Decreased appetite  HENT: Negative for drooling.   Respiratory: Negative for cough, choking and shortness of breath.   Cardiovascular: Negative for leg swelling.       Right side pain  Gastrointestinal: Positive for constipation.  Musculoskeletal:       Right side pain  Skin: Negative for rash.  Neurological: Negative for seizures and syncope.       Objective:   Physical Exam  Constitutional: He is oriented to person, place, and time. He appears well-developed and well-nourished. No distress.  AA male in wheelchair  HENT:  Head: Normocephalic.    Pink lesion on forehead, appears as if eschar has been picked off  Eyes: Conjunctivae and EOM are normal. Pupils are equal, round, and reactive to light.  Neck: Neck supple.  Cardiovascular: Normal rate, regular rhythm, normal heart sounds and intact distal pulses.   No murmur heard. Pulmonary/Chest: Effort normal and breath sounds normal. He has no wheezes. He exhibits tenderness. He exhibits no deformity.    Pt grunted on initial palpation to right rib area  Abdominal: Soft. Bowel sounds are normal. He exhibits no distension. There is no tenderness. There is no guarding.    Musculoskeletal: He exhibits no edema and no tenderness.  Neurological: He is alert and oriented to person, place, and time.  Skin: Skin is warm and dry.  Psychiatric: He has a normal mood and affect.          Assessment & Plan:  #1 rib pain, right: after fall, likely bruised, no rib fracture on CXR today -manage with Tylenol 650 mg q8h prn pain -contact clinic if no improvement  #2 constipation: start stooll softner -Senakot qhs

## 2013-08-29 NOTE — Telephone Encounter (Signed)
CALLED MS. HAZEL  (PER REQUEST OF DR. SCHOOLER) TO LET HER KNOW THAT MR. Rubinstein'S X-RAY DID NOT SHOW AND BROKEN BONES. TO CONTINUE  TAKING HIS PAIN MEDICATION. TOLD MS. HAZEL THAT IF SHE HAD ANY QUESTIONS TO CALL OUR OFFICE  AT [phone].  Neaveh Belanger STURDIVAT NTII 11-10-014 1:46PM

## 2013-08-29 NOTE — Assessment & Plan Note (Signed)
Hard bowel movement yesterday after 3-4 days without BM.  No blood or mucus.

## 2013-08-29 NOTE — Patient Instructions (Addendum)
We will call you with results of the XRay. We have prescribed a stool softener. We have also prescribed Acetaminophen for his rib pain. If that does not seem to help, please call back to the clinic.

## 2013-08-30 NOTE — Progress Notes (Signed)
Case discussed with Dr. Bosie Clos soon after the resident saw the patient.  We reviewed the resident's history and exam and pertinent patient test results.  I agree with the assessment, diagnosis and plan of care documented in the resident's note.  CXR w/o evidence of pneumothorax or pleural effusion on right.  X-ray was not a rib detail so a non-displaced fracture may have been missed but therapy would not have changed.

## 2013-08-31 ENCOUNTER — Ambulatory Visit: Payer: Medicare Other | Admitting: Nurse Practitioner

## 2013-09-14 ENCOUNTER — Inpatient Hospital Stay (HOSPITAL_COMMUNITY)
Admission: EM | Admit: 2013-09-14 | Discharge: 2013-09-17 | DRG: 377 | Disposition: A | Discharge status: Home / Self Care | Payer: Medicare Other | Attending: Internal Medicine | Admitting: Internal Medicine

## 2013-09-14 ENCOUNTER — Encounter (HOSPITAL_COMMUNITY): Payer: Self-pay | Admitting: Emergency Medicine

## 2013-09-14 ENCOUNTER — Observation Stay (HOSPITAL_COMMUNITY): Payer: Medicare Other

## 2013-09-14 ENCOUNTER — Inpatient Hospital Stay (HOSPITAL_COMMUNITY): Payer: Medicare Other

## 2013-09-14 ENCOUNTER — Encounter (HOSPITAL_COMMUNITY)
Admission: EM | Disposition: A | Discharge status: Home / Self Care | Payer: Self-pay | Source: Home / Self Care | Attending: Internal Medicine

## 2013-09-14 DIAGNOSIS — K219 Gastro-esophageal reflux disease without esophagitis: Secondary | ICD-10-CM | POA: Diagnosis present

## 2013-09-14 DIAGNOSIS — R579 Shock, unspecified: Secondary | ICD-10-CM

## 2013-09-14 DIAGNOSIS — K922 Gastrointestinal hemorrhage, unspecified: Secondary | ICD-10-CM

## 2013-09-14 DIAGNOSIS — I120 Hypertensive chronic kidney disease with stage 5 chronic kidney disease or end stage renal disease: Secondary | ICD-10-CM | POA: Diagnosis present

## 2013-09-14 DIAGNOSIS — M899 Disorder of bone, unspecified: Secondary | ICD-10-CM | POA: Diagnosis present

## 2013-09-14 DIAGNOSIS — N2581 Secondary hyperparathyroidism of renal origin: Secondary | ICD-10-CM | POA: Diagnosis present

## 2013-09-14 DIAGNOSIS — E861 Hypovolemia: Secondary | ICD-10-CM | POA: Diagnosis present

## 2013-09-14 DIAGNOSIS — I509 Heart failure, unspecified: Secondary | ICD-10-CM | POA: Diagnosis present

## 2013-09-14 DIAGNOSIS — N186 End stage renal disease: Secondary | ICD-10-CM | POA: Diagnosis present

## 2013-09-14 DIAGNOSIS — K920 Hematemesis: Secondary | ICD-10-CM

## 2013-09-14 DIAGNOSIS — F79 Unspecified intellectual disabilities: Secondary | ICD-10-CM | POA: Diagnosis present

## 2013-09-14 DIAGNOSIS — K259 Gastric ulcer, unspecified as acute or chronic, without hemorrhage or perforation: Secondary | ICD-10-CM | POA: Diagnosis present

## 2013-09-14 DIAGNOSIS — K2289 Other specified disease of esophagus: Secondary | ICD-10-CM | POA: Diagnosis present

## 2013-09-14 DIAGNOSIS — D649 Anemia, unspecified: Secondary | ICD-10-CM | POA: Diagnosis present

## 2013-09-14 DIAGNOSIS — Z8711 Personal history of peptic ulcer disease: Secondary | ICD-10-CM

## 2013-09-14 DIAGNOSIS — I871 Compression of vein: Secondary | ICD-10-CM | POA: Diagnosis present

## 2013-09-14 DIAGNOSIS — K2960 Other gastritis without bleeding: Secondary | ICD-10-CM | POA: Diagnosis present

## 2013-09-14 DIAGNOSIS — R4701 Aphasia: Secondary | ICD-10-CM | POA: Diagnosis present

## 2013-09-14 DIAGNOSIS — K766 Portal hypertension: Secondary | ICD-10-CM | POA: Diagnosis present

## 2013-09-14 DIAGNOSIS — K2210 Ulcer of esophagus without bleeding: Secondary | ICD-10-CM | POA: Diagnosis present

## 2013-09-14 DIAGNOSIS — E872 Acidosis, unspecified: Secondary | ICD-10-CM | POA: Diagnosis present

## 2013-09-14 DIAGNOSIS — R569 Unspecified convulsions: Secondary | ICD-10-CM

## 2013-09-14 DIAGNOSIS — J690 Pneumonitis due to inhalation of food and vomit: Secondary | ICD-10-CM | POA: Diagnosis present

## 2013-09-14 DIAGNOSIS — G40909 Epilepsy, unspecified, not intractable, without status epilepticus: Secondary | ICD-10-CM | POA: Diagnosis present

## 2013-09-14 DIAGNOSIS — K228 Other specified diseases of esophagus: Secondary | ICD-10-CM | POA: Diagnosis present

## 2013-09-14 DIAGNOSIS — D62 Acute posthemorrhagic anemia: Secondary | ICD-10-CM | POA: Diagnosis not present

## 2013-09-14 DIAGNOSIS — Z79899 Other long term (current) drug therapy: Secondary | ICD-10-CM

## 2013-09-14 DIAGNOSIS — I8501 Esophageal varices with bleeding: Secondary | ICD-10-CM

## 2013-09-14 DIAGNOSIS — Z992 Dependence on renal dialysis: Secondary | ICD-10-CM

## 2013-09-14 DIAGNOSIS — E785 Hyperlipidemia, unspecified: Secondary | ICD-10-CM | POA: Diagnosis present

## 2013-09-14 DIAGNOSIS — I959 Hypotension, unspecified: Secondary | ICD-10-CM | POA: Diagnosis present

## 2013-09-14 DIAGNOSIS — I9589 Other hypotension: Secondary | ICD-10-CM | POA: Diagnosis present

## 2013-09-14 HISTORY — PX: ESOPHAGOGASTRODUODENOSCOPY: SHX5428

## 2013-09-14 LAB — COMPREHENSIVE METABOLIC PANEL
ALT: 18 U/L (ref 0–53)
AST: 23 U/L (ref 0–37)
Albumin: 4.8 g/dL (ref 3.5–5.2)
Alkaline Phosphatase: 164 U/L — ABNORMAL HIGH (ref 39–117)
BUN: 22 mg/dL (ref 6–23)
Calcium: 10.5 mg/dL (ref 8.4–10.5)
Chloride: 89 mEq/L — ABNORMAL LOW (ref 96–112)
GFR calc Af Amer: 8 mL/min — ABNORMAL LOW (ref >90)
Potassium: 3.4 mEq/L — ABNORMAL LOW (ref 3.5–5.1)
Sodium: 146 mEq/L — ABNORMAL HIGH (ref 135–145)
Total Protein: 9.4 g/dL — ABNORMAL HIGH (ref 6.0–8.3)

## 2013-09-14 LAB — CBC WITH DIFFERENTIAL/PLATELET
Basophils Absolute: 0 10*3/uL (ref 0.0–0.1)
Basophils Relative: 0 % (ref 0–1)
Eosinophils Absolute: 0 10*3/uL (ref 0.0–0.7)
Eosinophils Relative: 0 % (ref 0–5)
MCH: 32.7 pg (ref 26.0–34.0)
MCV: 99.3 fL (ref 78.0–100.0)
Neutrophils Relative %: 75 % (ref 43–77)
Platelets: 198 10*3/uL (ref 150–400)
RBC: 4.07 MIL/uL — ABNORMAL LOW (ref 4.22–5.81)
RDW: 14.6 % (ref 11.5–15.5)

## 2013-09-14 LAB — POCT I-STAT, CHEM 8
Calcium, Ion: 1.02 mmol/L — ABNORMAL LOW (ref 1.13–1.30)
Chloride: 89 mEq/L — ABNORMAL LOW (ref 96–112)
Creatinine, Ser: 7.6 mg/dL — ABNORMAL HIGH (ref 0.50–1.35)
HCT: 43 % (ref 39.0–52.0)
Hemoglobin: 14.6 g/dL (ref 13.0–17.0)
Potassium: 3.3 mEq/L — ABNORMAL LOW (ref 3.5–5.1)
Sodium: 143 mEq/L (ref 135–145)
TCO2: 40 mmol/L (ref 0–100)

## 2013-09-14 LAB — SAMPLE TO BLOOD BANK

## 2013-09-14 LAB — PROTIME-INR: INR: 1 (ref 0.00–1.49)

## 2013-09-14 SURGERY — EGD (ESOPHAGOGASTRODUODENOSCOPY)
Anesthesia: Moderate Sedation

## 2013-09-14 MED ORDER — SODIUM CHLORIDE 0.9 % IV SOLN
200.0000 mg | Freq: Every day | INTRAVENOUS | Status: DC
  Administered 2013-09-14 – 2013-09-15 (×2): 200 mg via INTRAVENOUS
  Filled 2013-09-14 (×6): qty 4

## 2013-09-14 MED ORDER — OCTREOTIDE LOAD VIA INFUSION
50.0000 ug | Freq: Once | INTRAVENOUS | Status: AC
  Administered 2013-09-14: 50 ug via INTRAVENOUS
  Filled 2013-09-14: qty 25

## 2013-09-14 MED ORDER — FENTANYL CITRATE 0.05 MG/ML IJ SOLN
INTRAMUSCULAR | Status: DC | PRN
  Administered 2013-09-14: 12.5 ug via INTRAVENOUS

## 2013-09-14 MED ORDER — SODIUM CHLORIDE 0.9 % IV SOLN
8.0000 mg/h | INTRAVENOUS | Status: DC
  Administered 2013-09-14 – 2013-09-15 (×2): 8 mg/h via INTRAVENOUS
  Filled 2013-09-14 (×6): qty 80

## 2013-09-14 MED ORDER — SODIUM CHLORIDE 0.9 % IJ SOLN: 3.0000 mL | Freq: Two times a day (BID) | INTRAMUSCULAR | Status: DC

## 2013-09-14 MED ORDER — SODIUM CHLORIDE 0.9 % IV SOLN
1000.0000 mL | INTRAVENOUS | Status: DC
  Administered 2013-09-14: 1000 mL via INTRAVENOUS

## 2013-09-14 MED ORDER — SODIUM CHLORIDE 0.9 % IV SOLN
25.0000 ug/h | INTRAVENOUS | Status: DC
  Administered 2013-09-14 – 2013-09-15 (×2): 25 ug/h via INTRAVENOUS
  Filled 2013-09-14 (×5): qty 1

## 2013-09-14 MED ORDER — SODIUM CHLORIDE 0.9 % IV SOLN
80.0000 mg | Freq: Once | INTRAVENOUS | Status: AC
  Administered 2013-09-14: 80 mg via INTRAVENOUS
  Filled 2013-09-14: qty 80

## 2013-09-14 MED ORDER — SODIUM CHLORIDE 0.9 % IV SOLN
1000.0000 mL | Freq: Once | INTRAVENOUS | Status: AC
  Administered 2013-09-14: 1000 mL via INTRAVENOUS

## 2013-09-14 MED ORDER — MIDAZOLAM HCL 5 MG/ML IJ SOLN
INTRAMUSCULAR | Status: AC
  Filled 2013-09-14: qty 2

## 2013-09-14 MED ORDER — MIDAZOLAM HCL 10 MG/2ML IJ SOLN
INTRAMUSCULAR | Status: DC | PRN
  Administered 2013-09-14 (×2): 1 mg via INTRAVENOUS
  Administered 2013-09-14: 2 mg via INTRAVENOUS

## 2013-09-14 MED ORDER — ONDANSETRON HCL 4 MG/2ML IJ SOLN: 4.0000 mg | Freq: Three times a day (TID) | INTRAMUSCULAR | Status: DC | PRN

## 2013-09-14 MED ORDER — SODIUM CHLORIDE 0.9 % IV SOLN
INTRAVENOUS | Status: DC
  Administered 2013-09-14: 12:00:00 via INTRAVENOUS

## 2013-09-14 MED ORDER — SODIUM CHLORIDE 0.9 % IV SOLN: 250.0000 mL | INTRAVENOUS | Status: DC | PRN

## 2013-09-14 MED ORDER — SODIUM CHLORIDE 0.9 % IJ SOLN: 3.0000 mL | INTRAMUSCULAR | Status: DC | PRN

## 2013-09-14 MED ORDER — FENTANYL CITRATE 0.05 MG/ML IJ SOLN
INTRAMUSCULAR | Status: AC
  Filled 2013-09-14: qty 2

## 2013-09-14 MED ORDER — SODIUM CHLORIDE 0.9 % IV SOLN: INTRAVENOUS | Status: DC

## 2013-09-14 MED ORDER — SODIUM CHLORIDE 0.9 % IV SOLN
1000.0000 mL | Freq: Once | INTRAVENOUS | Status: AC
Start: 2013-09-14 — End: 2013-09-14
  Administered 2013-09-14: 1000 mL via INTRAVENOUS

## 2013-09-14 NOTE — ED Provider Notes (Addendum)
TIME SEEN: 7:01AM  CHIEF COMPLAINT: Hematemesis versus epistaxis  HPI: Patient is a 62 year old male with a history of portal hypertension, liver disease, esophageal varices and prior gastric ulcer, and standard disease on hemodialysis, superior vena cava syndrome, mental retardation and mutism, epilepsy who presents emergency department via EMS with hematemesis versus epistaxis.  Pt found by caregiver this AM with bright red blood around mouth and nose with unclear source.  In ED, pt is tachycardic and hypotensive.  ROS: Unable to obtain as patient is mute at baseline  PAST MEDICAL HISTORY/PAST SURGICAL HISTORY:  Past Medical History  Diagnosis Date  . Upper respiratory infection 12/12/2009  . Heart murmur, systolic 08/10/2009  . 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 Hypotension 05/26/2009  . Syncope 05/07/2009  . 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 Esophageal varices 11/07/2008  . Redness or discharge of eye 11/06/2008  . Skin tag 08/25/2008  . Gastric ulcer 10/09    antral, with h pylori positive  . Abdominal pain, acute, generalized 06/28/2008  . Congestive heart failure 03/06/2008  . Cellulitis and abscess of leg, except foot 03/06/2008  . Sinus tachycardia 03/06/2008  . Edema 03/06/2008  . Impacted cerumen of both ears 03/06/2008  . Secondary hyperparathyroidism 02/02/2008  . Mute 02/02/2008  . Mental retardation 02/02/2008  . Hyperlipidemia 02/02/2008  . Anemia 02/02/2008  . ESRD (end stage renal disease)     TTS hemodialysis  . GERD (gastroesophageal reflux disease)   . Hypertension 02/02/2008    in history  . Seizures     "non in a while at home"    MEDICATIONS:  Prior to Admission medications   Medication Sig Start Date End Date Taking? Authorizing Provider  acetaminophen (TYLENOL) 325 MG tablet Take 2 tablets (650 mg total) by mouth every 6 (six) hours as needed for moderate pain. 08/29/13   Manuela Schwartz, MD  atorvastatin (LIPITOR) 20 MG tablet Take 1  tablet (20 mg total) by mouth daily. 04/27/13   Linward Headland, MD  clonazePAM (KLONOPIN) 0.5 MG tablet Take 0.5 tablets (0.25 mg total) by mouth 2 (two) times daily. 04/05/13   Linward Headland, MD  folic acid-vitamin b complex-vitamin c-selenium-zinc (DIALYVITE) 3 MG TABS Take 1 tablet by mouth daily. 01/18/13   Ulyess Mort, MD  midodrine (PROAMATINE) 10 MG tablet Take 15 mg by mouth 3 (three) times daily.     Historical Provider, MD  pantoprazole (PROTONIX) 40 MG tablet Take 1 tablet (40 mg total) by mouth daily. 08/03/13   Linward Headland, MD  phenytoin (DILANTIN) 100 MG ER capsule Take 2 capsules (200 mg total) by mouth at bedtime. 03/02/13   Nilda Riggs, NP  senna-docusate (SENOKOT-S) 8.6-50 MG per tablet Take 1 tablet by mouth at bedtime. 08/29/13   Manuela Schwartz, MD  sevelamer carbonate (RENVELA) 800 MG tablet Take 800 mg by mouth 3 (three) times daily with meals.    Historical Provider, MD    ALLERGIES:  No Known Allergies  SOCIAL HISTORY:  History  Substance Use Topics  . Smoking status: Never Smoker   . Smokeless tobacco: Never Used  . Alcohol Use: No    FAMILY HISTORY: No family history on file.  EXAM: BP 79/40  Pulse 125  Temp(Src) 97.3 F (36.3 C) (Oral)  Resp 12  SpO2 100% CONSTITUTIONAL: Alert and follows commands appropriately but is mute at baseline HEAD: Normocephalic EYES: Conjunctivae clear, PERRL; no conjunctival pallor ENT: normal nose; no rhinorrhea; bright red blood in bilateral nares with  no active bleeding appreciated, moist mucous membranes; pharynx without lesions noted, bright red blood in posterior OP NECK: Supple, no meningismus, no LAD  CARD: tachycardic; S1 and S2 appreciated; systolic murmur, no clicks, no rubs, no gallops RESP: Normal chest excursion without splinting or tachypnea; breath sounds clear and equal bilaterally; no wheezes, no rhonchi, no rales,  ABD/GI: Normal bowel sounds; non-distended; soft, non-tender, no rebound, no  guarding BACK:  The back appears normal and is non-tender to palpation, there is no CVA tenderness EXT: Normal ROM in all joints; non-tender to palpation; no edema; normal capillary refill; no cyanosis; 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 Grooming and personal hygiene are appropriate.  MEDICAL DECISION MAKING: Pt here with hematemesis versus epistaxis.  No active vomiting in the ED or active nosebleed.  Pt is hypotensive and tachycardic.  Will place pt in soft restraints as he is fighting staff when placing IVs.  Will start IVF, octreotide given h/o varices without clear source of bleeding, PPI.  Labs pending.  Will need admission.  ED PROGRESS: Patient has a lactate of 4.91. His hemoglobin is 13.3. His blood pressure has improved to the upper 80s after 1 L IV fluid bolus. Heart rate is now in 90s. His sister is not bedside and confirms that he had vomiting of bright red blood that started at 2 AM. No coffee-ground emesis. No further vomiting in the ED.   8:31 AM  Last BP 91/52.  Per dialysis records, this is patient's baseline which his sister confirms. Given his improved blood pressure, hemoglobin 13.3 without further bleeding, I feel he is safe for step down. Will discuss with internal medicine service for admission and [facility] gastroenterology for consultation.  8:43 AM  Spoke with Carman Ching with [facility] who will see in consultation today.  9:23 AM  Spoke with IM resident for admission to stepdown.  Pt stable.  NAD.  BP stable and at baseline.  Family updated.  11:30 AM  Pt still stable with stable BP and no further vomiting or BM.  CRITICAL CARE Performed by: Raelyn Number   Total critical care time: 30 minutes  Critical care time was  exclusive of separately billable procedures and treating other patients.  Critical care was necessary to treat or prevent imminent or life-threatening deterioration.  Critical care was time spent personally by me on the following activities: development of treatment plan with patient and/or surrogate as well as nursing, discussions with consultants, evaluation of patient's response to treatment, examination of patient, obtaining history from patient or surrogate, ordering and performing treatments and interventions, ordering and review of laboratory studies, ordering and review of radiographic studies, pulse oximetry and re-evaluation of patient's condition.   Layla Maw Zyion Leidner, DO 09/14/13 7829  Layla Maw Karole Oo, DO 09/14/13 1142

## 2013-09-14 NOTE — H&P (View-Only) (Signed)
[facility] CONSULT Reason for consult: Hematemsis Referring Physician: Medical Teaching Service  [name] is an 62 y.o. male.  HPI: he was admitted with hematemesis. He has a history of mutism and mental retardation. Most of the history is obtained from his sister was also his power of attorney. Apparently the patient was found by the caregiver with some bright blood around his mouth and nose with some nausea with some hematemesis. He was seen in the emergency room was hypotensive and given fluids and based on a history of liver disease in his records started on IV octreotide. He has had no further bleeding senses admission in his hemoglobin is 14.6 the patient's platelet count is 198. He carries a history of cirrhosis and varices according to his all records, but liver tests have been a normal and I can find no radiographic evidence of cirrhosis. 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 He has had G.I. bleeding 2 years previously with blood in the stomach with no active bleeding source found. He has a hemodialysis patients and apparently has had H. pylori in the past in biopsies and has had a previous history of gastric ulcers. According to the records, he takes protonix at home and his sister [name] cyst. No history of NSAID use he does take acetaminophen. Other problems include seizure disorder. He has a history of. Her vena cava syndrome myocolonus  and reflux.  Past Medical History  Diagnosis Date  . Upper respiratory infection 12/12/2009  . Heart murmur, systolic 08/10/2009  . 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 Hypotension 05/26/2009  . Syncope 05/07/2009  . 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 Esophageal varices 11/07/2008  . Redness or discharge of eye 11/06/2008  . Skin tag 08/25/2008  . Gastric ulcer 10/09    antral, with h pylori positive  . Abdominal pain, acute, generalized 06/28/2008  . Congestive  heart failure 03/06/2008  . Cellulitis and abscess of leg, except foot 03/06/2008  . Sinus tachycardia 03/06/2008  . Edema 03/06/2008  . Impacted cerumen of both ears 03/06/2008  . Secondary hyperparathyroidism 02/02/2008  . Mute 02/02/2008  . Mental retardation 02/02/2008  . Hyperlipidemia 02/02/2008  . Anemia 02/02/2008  . ESRD (end stage renal disease)     TTS hemodialysis  . GERD (gastroesophageal reflux disease)   . Hypertension 02/02/2008    in history  . Seizures     "non in a while at home"    Past Surgical History  Procedure Laterality Date  . Left forearm graft      for HD  . Arteriovenous graft placement  11/22/10    Right thigh AVG  . Thrombectomy and revision of arterioventous (av) goretex  graft    . Thrombectomy and revision of arterioventous (av) goretex  graft  10/22/2012    Procedure: THROMBECTOMY AND REVISION OF ARTERIOVENTOUS (AV) GORETEX  GRAFT;  Surgeon: Larina Earthly, MD;  Location: [facility] OR;  Service: Vascular;  Laterality: Right;  . Thrombectomy w/ embolectomy  11/10/2012    Procedure: THROMBECTOMY ARTERIOVENOUS GORE-TEX GRAFT;  Surgeon: Pryor Ochoa, MD;  Location: [facility] OR;  Service: Vascular;  Laterality: Right;  . Thrombectomy and revision of arterioventous (av) goretex  graft Right 12/08/2012    Procedure: THROMBECTOMY AND REVISION OF ARTERIOVENTOUS (AV) GORETEX  GRAFT right thigh;  Surgeon: Sherren Kerns, MD;  Location: [facility] OR;  Service: Vascular;  Laterality: Right;  Susie Cassette N/A 12/08/2012    Procedure: VENOGRAM;  Surgeon: Sherren Kerns, MD;  Location: [facility] OR;  Service: Vascular;  Laterality: N/A;  Intraoperative Central venogram  . Thrombectomy w/ embolectomy Right 12/12/2012    Procedure: THROMBECTOMY ARTERIOVENOUS GORE-TEX GRAFT;  Surgeon: Nada Libman, MD;  Location: [facility] OR;  Service: Vascular;  Laterality: Right;  . Insertion of dialysis catheter Left 12/14/2012    Procedure: INSERTION OF DIALYSIS CATHETER;  Surgeon: Nada Libman, MD;   Location: [facility] OR;  Service: Vascular;  Laterality: Left;  . Insertion of dialysis catheter Right 01/13/2013    Procedure: INSERTION OF DIALYSIS CATHETER;  Surgeon: Nada Libman, MD;  Location: [facility] OR;  Service: Vascular;  Laterality: Right;  . Removal of a dialysis catheter Left 01/13/2013    Procedure: REMOVAL OF A DIALYSIS CATHETER;  Surgeon: Nada Libman, MD;  Location: [facility] OR;  Service: Vascular;  Laterality: Left;  . Av fistula placement Left 02/11/2013    Procedure: INSERTION OF ARTERIOVENOUS (AV) GORE-TEX GRAFT THIGH;  Surgeon: Nada Libman, MD;  Location: [facility] OR;  Service: Vascular;  Laterality: Left;  using 6mm x 50cm Gore-Tex Vascular Graft  . Esophagogastroduodenoscopy N/A 02/16/2013    Procedure: ESOPHAGOGASTRODUODENOSCOPY (EGD);  Surgeon: Vertell Novak., MD;  Location: [facility] ENDOSCOPY;  Service: Endoscopy;  Laterality: N/A;  bedside    No family history on file.  Social History:  reports that he has never smoked. He has never used smokeless tobacco. He reports that he does not drink alcohol or use illicit drugs.  Allergies: No Known Allergies  Medications; Prior to Admission medications   Medication Sig Start Date End Date Taking? Authorizing Provider  acetaminophen (TYLENOL) 325 MG tablet Take 2 tablets (650 mg total) by mouth every 6 (six) hours as needed for moderate pain. 08/29/13  Yes Manuela Schwartz, MD  atorvastatin (LIPITOR) 20 MG tablet Take 1 tablet (20 mg total) by mouth daily. 04/27/13  Yes Linward Headland, MD  clonazePAM (KLONOPIN) 0.5 MG tablet Take 0.5 tablets (0.25 mg total) by mouth 2 (two) times daily. 04/05/13  Yes Linward Headland, MD  folic acid-vitamin b complex-vitamin c-selenium-zinc (DIALYVITE) 3 MG TABS Take 1 tablet by mouth daily. 01/18/13  Yes Ulyess Mort, MD  midodrine (PROAMATINE) 10 MG tablet Take 15 mg by mouth 3 (three) times daily.    Yes Historical Provider, MD  pantoprazole (PROTONIX) 40 MG tablet Take 1 tablet (40 mg total) by mouth daily.  08/03/13  Yes Linward Headland, MD  phenytoin (DILANTIN) 100 MG ER capsule Take 2 capsules (200 mg total) by mouth at bedtime. 03/02/13  Yes Nilda Riggs, NP  sevelamer carbonate (RENVELA) 800 MG tablet Take 800 mg by mouth 3 (three) times daily with meals.   Yes Historical Provider, MD   . phenytoin (DILANTIN) IV  200 mg Intravenous QHS  . sodium chloride  3 mL Intravenous Q12H  . sodium chloride  3 mL Intravenous Q12H   PRN Meds sodium chloride, ondansetron (ZOFRAN) IV, sodium chloride Results for orders placed during the hospital encounter of 09/14/13 (from the past 48 hour(s))  CBC WITH DIFFERENTIAL     Status: Abnormal   Collection Time    09/14/13  7:20 AM      Result Value Range   WBC 9.3  4.0 - 10.5 K/uL   RBC 4.07 (*) 4.22 - 5.81 MIL/uL   Hemoglobin 13.3  13.0 - 17.0 g/dL   HCT 14.7  82.9 - 56.2 %   MCV 99.3  78.0 - 100.0 fL  MCH 32.7  26.0 - 34.0 pg   MCHC 32.9  30.0 - 36.0 g/dL   RDW 16.1  09.6 - 04.5 %   Platelets 198  150 - 400 K/uL   Neutrophils Relative % 75  43 - 77 %   Neutro Abs 6.9  1.7 - 7.7 K/uL   Lymphocytes Relative 16  12 - 46 %   Lymphs Abs 1.5  0.7 - 4.0 K/uL   Monocytes Relative 9  3 - 12 %   Monocytes Absolute 0.9  0.1 - 1.0 K/uL   Eosinophils Relative 0  0 - 5 %   Eosinophils Absolute 0.0  0.0 - 0.7 K/uL   Basophils Relative 0  0 - 1 %   Basophils Absolute 0.0  0.0 - 0.1 K/uL  COMPREHENSIVE METABOLIC PANEL     Status: Abnormal   Collection Time    09/14/13  7:20 AM      Result Value Range   Sodium 146 (*) 135 - 145 mEq/L   Potassium 3.4 (*) 3.5 - 5.1 mEq/L   Chloride 89 (*) 96 - 112 mEq/L   CO2 39 (*) 19 - 32 mEq/L   Glucose, Bld 137 (*) 70 - 99 mg/dL   BUN 22  6 - 23 mg/dL   Creatinine, Ser 4.09 (*) 0.50 - 1.35 mg/dL   Calcium 81.1  8.4 - 91.4 mg/dL   Total Protein 9.4 (*) 6.0 - 8.3 g/dL   Albumin 4.8  3.5 - 5.2 g/dL   AST 23  0 - 37 U/L   ALT 18  0 - 53 U/L   Alkaline Phosphatase 164 (*) 39 - 117 U/L   Total Bilirubin 0.3  0.3 -  1.2 mg/dL   GFR calc non Af Amer 7 (*) >90 mL/min   GFR calc Af Amer 8 (*) >90 mL/min   Comment: (NOTE)     The eGFR has been calculated using the CKD EPI equation.     This calculation has not been validated in all clinical situations.     eGFR's persistently <90 mL/min signify possible Chronic Kidney     Disease.  LIPASE, BLOOD     Status: Abnormal   Collection Time    09/14/13  7:20 AM      Result Value Range   Lipase 62 (*) 11 - 59 U/L  PROTIME-INR     Status: None   Collection Time    09/14/13  7:20 AM      Result Value Range   Prothrombin Time 13.0  11.6 - 15.2 seconds   INR 1.00  0.00 - 1.49  SAMPLE TO BLOOD BANK     Status: None   Collection Time    09/14/13  7:20 AM      Result Value Range   Blood Bank Specimen SAMPLE AVAILABLE FOR TESTING     Sample Expiration 09/15/2013    CG4 I-STAT (LACTIC ACID)     Status: Abnormal   Collection Time    09/14/13  7:39 AM      Result Value Range   Lactic Acid, Venous 4.91 (*) 0.5 - 2.2 mmol/L  POCT I-STAT, CHEM 8     Status: Abnormal   Collection Time    09/14/13  7:39 AM      Result Value Range   Sodium 143  135 - 145 mEq/L   Potassium 3.3 (*) 3.5 - 5.1 mEq/L   Chloride 89 (*) 96 - 112 mEq/L   BUN 25 (*) 6 -  23 mg/dL   Creatinine, Ser 9.60 (*) 0.50 - 1.35 mg/dL   Glucose, Bld 454 (*) 70 - 99 mg/dL   Calcium, Ion 0.98 (*) 1.13 - 1.30 mmol/L   TCO2 40  0 - 100 mmol/L   Hemoglobin 14.6  13.0 - 17.0 g/dL   HCT 11.9  14.7 - 82.9 %  OCCULT BLOOD, POC DEVICE     Status: Abnormal   Collection Time    09/14/13  7:42 AM      Result Value Range   Fecal Occult Bld POSITIVE (*) NEGATIVE    Dg Chest Port 1 View  09/14/2013   CLINICAL DATA:  Altered mental status.  EXAM: PORTABLE CHEST - 1 VIEW  COMPARISON:  08/29/2013  FINDINGS: Heart is normal size. No confluent airspace opacities. Low lung volumes. No effusions or acute bony abnormality.  IMPRESSION: No active disease.   Electronically Signed   By: Charlett Nose M.D.   On:  09/14/2013 10:52               Blood pressure 103/58, pulse 88, temperature 97.3 F (36.3 C), temperature source Oral, resp. rate 11, SpO2 100.00%.  Physical exam:   General-- African-American male in no distress. Obvious some retardation Heart--normal RRR w/o  M/G Lungs--clear  Abdomen-- soft nontender   Assessment: 1. Hematemesis. This is the 3rd time the patient has had this over the past several years. It is not clear if this is nosebleed or coming from his upper G.I. tract. I think it would be helpful to have an endoscopy to see if there is signs of active or recently in the stomach. Hemoglobin is quite good. 2. Has diagnosis of cirrhosis/varices/portal hypertension. I cannot find anything to document this diagnosis in the records. Last 2 endoscopies have not shown obvious varices. Last CT scan in the system 2009 showed normal liver and spleen. current labs show were normal liver function tests. At this point, I would have to question that diagnosis. 3. ESRD on hemodialysis with multiple vascular issues 4. History of seizure disorder 5. Mental retardation  Plan: 1. Will proceed with diagnostic EGD later today. I have discussed this with the patient's sister who is his POA. 2. When he is stable from his G.I. bleeding, would consider ultrasound of his liver to evaluate for any signs of chronic liver disease or a CT.   Jermar Colter JR,Bailey Kolbe L 09/14/2013, 1:18 PM

## 2013-09-14 NOTE — Consult Note (Signed)
[facility] Renal Consultation Note    Indication for Consultation:  Management of ESRD/hemodialysis; anemia, hypertension/volume and secondary hyperparathyroidism  HPI: [name] is a 62 y.o. male with ESRD, MR, mute on TTS dialysis at [facility].   Presented this am to the ED with vomiting of BRB that started at 2 am without coffee ground emesis (per chart) - found by his caregiver.  Has not vomited since then. Pt initially tachycardic 125 with BP 70s.  Had uneventful dialysis yesterday and got to edw of 55.5 which had been lowered 1 kg about a week ago.  Hgb has been stable in the out patient unit and he generally receives 2800 units heparin with each treatment.  The patient is unable to give any history. Two sisters are in the room with him today. A bedside EGD is planned.  Past Medical History  Diagnosis Date  . Upper respiratory infection 12/12/2009  . Heart murmur, systolic 08/10/2009  . 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 Hypotension 05/26/2009  . Syncope 05/07/2009  . 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 Esophageal varices 11/07/2008  . Redness or discharge of eye 11/06/2008  . Skin tag 08/25/2008  . Gastric ulcer 10/09    antral, with h pylori positive  . Abdominal pain, acute, generalized 06/28/2008  . Congestive heart failure 03/06/2008  . Cellulitis and abscess of leg, except foot 03/06/2008  . Sinus tachycardia 03/06/2008  . Edema 03/06/2008  . Impacted cerumen of both ears 03/06/2008  . Secondary hyperparathyroidism 02/02/2008  . Mute 02/02/2008  . Mental retardation 02/02/2008  . Hyperlipidemia 02/02/2008  . Anemia 02/02/2008  . ESRD (end stage renal disease)     TTS hemodialysis  . GERD (gastroesophageal reflux disease)   . Hypertension 02/02/2008    in history  . Seizures     "non in a while at home"   Past Surgical History  Procedure Laterality Date  . Left forearm graft      for HD  . Arteriovenous graft placement  11/22/10    Right thigh AVG  .  Thrombectomy and revision of arterioventous (av) goretex  graft    . Thrombectomy and revision of arterioventous (av) goretex  graft  10/22/2012    Procedure: THROMBECTOMY AND REVISION OF ARTERIOVENTOUS (AV) GORETEX  GRAFT;  Surgeon: Larina Earthly, MD;  Location: [facility] OR;  Service: Vascular;  Laterality: Right;  . Thrombectomy w/ embolectomy  11/10/2012    Procedure: THROMBECTOMY ARTERIOVENOUS GORE-TEX GRAFT;  Surgeon: Pryor Ochoa, MD;  Location: [facility] OR;  Service: Vascular;  Laterality: Right;  . Thrombectomy and revision of arterioventous (av) goretex  graft Right 12/08/2012    Procedure: THROMBECTOMY AND REVISION OF ARTERIOVENTOUS (AV) GORETEX  GRAFT right thigh;  Surgeon: Sherren Kerns, MD;  Location: [facility] OR;  Service: Vascular;  Laterality: Right;  Susie Cassette N/A 12/08/2012    Procedure: VENOGRAM;  Surgeon: Sherren Kerns, MD;  Location: [facility] OR;  Service: Vascular;  Laterality: N/A;  Intraoperative Central venogram  . Thrombectomy w/ embolectomy Right 12/12/2012    Procedure: THROMBECTOMY ARTERIOVENOUS GORE-TEX GRAFT;  Surgeon: Nada Libman, MD;  Location: [facility] OR;  Service: Vascular;  Laterality: Right;  . Insertion of dialysis catheter Left 12/14/2012    Procedure: INSERTION OF DIALYSIS CATHETER;  Surgeon: Nada Libman, MD;  Location: [facility] OR;  Service: Vascular;  Laterality: Left;  . Insertion of dialysis catheter Right 01/13/2013    Procedure: INSERTION OF DIALYSIS CATHETER;  Surgeon: Nada Libman, MD;  Location:  [facility] OR;  Service: Vascular;  Laterality: Right;  . Removal of a dialysis catheter Left 01/13/2013    Procedure: REMOVAL OF A DIALYSIS CATHETER;  Surgeon: Nada Libman, MD;  Location: [facility] OR;  Service: Vascular;  Laterality: Left;  . Av fistula placement Left 02/11/2013    Procedure: INSERTION OF ARTERIOVENOUS (AV) GORE-TEX GRAFT THIGH;  Surgeon: Nada Libman, MD;  Location: [facility] OR;  Service: Vascular;  Laterality: Left;  using 6mm x 50cm Gore-Tex Vascular Graft  .  Esophagogastroduodenoscopy N/A 02/16/2013    Procedure: ESOPHAGOGASTRODUODENOSCOPY (EGD);  Surgeon: Vertell Novak., MD;  Location: [facility] ENDOSCOPY;  Service: Endoscopy;  Laterality: N/A;  bedside   History reviewed. No pertinent family history. Social History:  reports that he has never smoked. He has never used smokeless tobacco. He reports that he does not drink alcohol or use illicit drugs. No Known Allergies Prior to Admission medications   Medication Sig Start Date End Date Taking? Authorizing Provider  acetaminophen (TYLENOL) 325 MG tablet Take 2 tablets (650 mg total) by mouth every 6 (six) hours as needed for moderate pain. 08/29/13  Yes Manuela Schwartz, MD  atorvastatin (LIPITOR) 20 MG tablet Take 1 tablet (20 mg total) by mouth daily. 04/27/13  Yes Linward Headland, MD  clonazePAM (KLONOPIN) 0.5 MG tablet Take 0.5 tablets (0.25 mg total) by mouth 2 (two) times daily. 04/05/13  Yes Linward Headland, MD  folic acid-vitamin b complex-vitamin c-selenium-zinc (DIALYVITE) 3 MG TABS Take 1 tablet by mouth daily. 01/18/13  Yes Ulyess Mort, MD  midodrine (PROAMATINE) 10 MG tablet Take 15 mg by mouth 3 (three) times daily.    Yes Historical Provider, MD  pantoprazole (PROTONIX) 40 MG tablet Take 1 tablet (40 mg total) by mouth daily. 08/03/13  Yes Linward Headland, MD  phenytoin (DILANTIN) 100 MG ER capsule Take 2 capsules (200 mg total) by mouth at bedtime. 03/02/13  Yes Nilda Riggs, NP  sevelamer carbonate (RENVELA) 800 MG tablet Take 800 mg by mouth 3 (three) times daily with meals.   Yes Historical Provider, MD   Current Facility-Administered Medications  Medication Dose Route Frequency Provider Last Rate Last Dose  . 0.9 %  sodium chloride infusion  250 mL Intravenous PRN Na Li, MD      . 0.9 %  sodium chloride infusion   Intravenous Continuous Na Li, MD 50 mL/hr at 09/14/13 1300    . 0.9 %  sodium chloride infusion   Intravenous Continuous Vertell Novak., MD 20 mL/hr at 09/14/13  1330 20 mL at 09/14/13 1330  . octreotide (SANDOSTATIN) 2 mcg/mL in sodium chloride 0.9 % 250 mL infusion  25-50 mcg/hr Intravenous Continuous Kristen N Ward, DO 12.5 mL/hr at 09/14/13 0819 25 mcg/hr at 09/14/13 0819  . ondansetron (ZOFRAN) injection 4 mg  4 mg Intravenous Q8H PRN Kristen N Ward, DO      . pantoprazole (PROTONIX) 80 mg in sodium chloride 0.9 % 250 mL infusion  8 mg/hr Intravenous Continuous Olivia Mackie, MD 25 mL/hr at 09/14/13 1300 8 mg/hr at 09/14/13 1300  . phenytoin (DILANTIN) 200 mg in sodium chloride 0.9 % 100 mL IVPB  200 mg Intravenous QHS Hessie Diener Markle, RPH      . sodium chloride 0.9 % injection 3 mL  3 mL Intravenous Q12H Na Li, MD      . sodium chloride 0.9 % injection 3 mL  3 mL Intravenous Q12H Dede Query, MD      .  sodium chloride 0.9 % injection 3 mL  3 mL Intravenous PRN Dede Query, MD       Labs: Basic Metabolic Panel:  Recent Labs Lab 09/14/13 0720 09/14/13 0739  NA 146* 143  K 3.4* 3.3*  CL 89* 89*  CO2 39*  --   GLUCOSE 137* 137*  BUN 22 25*  CREATININE 7.43* 7.60*  CALCIUM 10.5  --    Liver Function Tests:  Recent Labs Lab 09/14/13 0720  AST 23  ALT 18  ALKPHOS 164*  BILITOT 0.3  PROT 9.4*  ALBUMIN 4.8    Recent Labs Lab 09/14/13 0720  LIPASE 62*  CBC:  Recent Labs Lab 09/14/13 0720 09/14/13 0739  WBC 9.3  --   NEUTROABS 6.9  --   HGB 13.3 14.6  HCT 40.4 43.0  MCV 99.3  --   PLT 198  --   Studies/Results: Dg Chest Port 1 View  09/14/2013   CLINICAL DATA:  Altered mental status.  EXAM: PORTABLE CHEST - 1 VIEW  COMPARISON:  08/29/2013  FINDINGS: Heart is normal size. No confluent airspace opacities. Low lung volumes. No effusions or acute bony abnormality.  IMPRESSION: No active disease.   Electronically Signed   By: Charlett Nose M.D.   On: 09/14/2013 10:52    ROS: Unable to obtain due to MR and mute status.  Physical Exam: BP is baseline for him Filed Vitals:   09/14/13 0915 09/14/13 1030 09/14/13 1206 09/14/13 1328   BP: 97/58 103/58 101/61   Pulse: 88 88 89 89  Temp:   98 F (36.7 C)   TempSrc:   Oral   Resp: 12 11 10 12   Height:   5\' 5"  (1.651 m)   Weight:   55.9 kg (123 lb 3.8 oz)   SpO2: 100% 100% 98% 100%     General: Well developed, well nourished, in no acute distress. Head: Normocephalic, atraumatic, sclera non-icteric, mucus membranes are moist Neck: Supple. JVD not elevated. Lungs: Clear bilaterally to auscultation without wheezes, rales, or rhonchi. Breathing is unlabored. Heart: RRR with S1 S2. No murmurs, rubs, or gallops appreciated. Abdomen: Soft, non-tender, non-distended with normoactive bowel sounds. No rebound/guarding. No obvious abdominal masses. Lower extremities:without edema or ischemic changes, no open wounds  Neuro: Alert, nonverbal. Moves all extremities spontaneously. Dialysis Access: left thigh graft patent  Dialysis Orders: Center: [facility] TTS 3/75 hour Optiflux 160 100/ A 1.5 left thigh graft EDW 55.5 (decreased from 56.5 11/20, Epo 1000 hectorol 12 ven 50/week heaprin 2800 2 K 2 Ca bath Recent labs:  Hgb 11.3 11/20 with tsat 34% ferritin 547 10/23 iPTH 617 Oct = usual range Assessment/Plan: 1. Hematemesis vs epistaxis - difficult to assess given his  hx esoph variceies (2009), gastric ulcer/MR and mutism.  However, Hgb appears to be stable HE is receiving IV PPI and empiric IV octreotide For EGD momentarily. Discussed with Dr. Randa Evens.  Nothing seen during EGD earlier this year by Dr. Randa Evens. LFTs normal. Pt not hypertensive. 2. ESRD -  TTS - s/p HD yesterday; inpatient unit closed tomorrow due to holiday. Should be able to wait until Friday for next HD - hold heparin K 3.3 3. Hypertension/volume  - CXR NAD; BP 90s. HAs received 2 L IVF since admission per records with IV at 50/hour - BP currently at baseline - once taking pos should d/c IVF 4. Anemia  - Hgb 13.3 - stable - no ESA for now 5. Metabolic bone disease -  Hold on ordering hectorol since  we don't know when  next HD will be- order at that time 6. Nutrition - npo  7. Sz d/o - getting IV dilantin  Sheffield Slider, PA-C [facility] Beeper [phone] 09/14/2013, 1:48 PM     Latashia Koch C

## 2013-09-14 NOTE — H&P (Signed)
Date: 09/14/2013               Patient Name:  [name] MRN: [id number]  DOB: [birth year] Age / Sex: 62 y.o., male   PCP: Linward Headland, MD         Medical Service: Internal Medicine Teaching Service         Attending Physician: Dr. Jonah Blue, DO    First Contact: Dr. Aundria Rud Pager: [phone]  Second Contact: Dr. Shirlee Latch Pager: [phone]       After Hours (After 5p/  First Contact Pager: [phone]  weekends / holidays): Second Contact Pager: [phone]   Chief Complaint: Hematemesis  History of Present Illness:  Patient is a 62 year old male with a PMH of ESRD on HD TTS, portal hypertension and esophageal varices and prior gastric ulcer, superior vena cava syndrome, mental retardation and mutism, and epilepsy who presents emergency department via EMS with hematemesis. Patient is mute but is able to answer yes or no questions.   The history is obtained by chart review and discussion with patient and his sisters  Pt was in his usual state of health until 2 am this morning when he was noted to have gradual onset of nausea and vomiting, and his caregiver noted bright red bloody emesis(amount and frequency unknown).  Denies fever, chills, headache, sore throat, chest pain, chest pressure, palpitation, abdominal pain, dizziness, lightheadedness, LOC or weakness. Patient was brought to the [facility] ED for evaluation and was noted to have tachycardia, hypotension of BP 79/40 and elevated lactic acid of 4.91. He was started on IVF resuscitation and received 2 liters NS bolus. The IMTS is called for admission.  Of note, he was hospitalized for similar problems in April 2014. No significant source of bleeding noted on EGD at the time. He had lactic acidosis of 9.09 and ? Aspiration PNA, which was treated with Abx. Notably, he had PEA arrest during EGD in 2012.    Meds: Current Facility-Administered Medications  Medication Dose Route Frequency Provider Last Rate Last Dose  . 0.9 %  sodium chloride  infusion  250 mL Intravenous PRN Levenia Skalicky, MD      . 0.9 %  sodium chloride infusion   Intravenous Continuous Karee Christopherson, MD 50 mL/hr at 09/14/13 1300    . 0.9 %  sodium chloride infusion   Intravenous Continuous Vertell Novak., MD 20 mL/hr at 09/14/13 1330 20 mL at 09/14/13 1330  . octreotide (SANDOSTATIN) 2 mcg/mL in sodium chloride 0.9 % 250 mL infusion  25-50 mcg/hr Intravenous Continuous Kristen N Ward, DO 12.5 mL/hr at 09/14/13 0819 25 mcg/hr at 09/14/13 0819  . ondansetron (ZOFRAN) injection 4 mg  4 mg Intravenous Q8H PRN Kristen N Ward, DO      . pantoprazole (PROTONIX) 80 mg in sodium chloride 0.9 % 250 mL infusion  8 mg/hr Intravenous Continuous Olivia Mackie, MD 25 mL/hr at 09/14/13 1300 8 mg/hr at 09/14/13 1300  . phenytoin (DILANTIN) 200 mg in sodium chloride 0.9 % 100 mL IVPB  200 mg Intravenous QHS Hessie Diener Markle, RPH      . sodium chloride 0.9 % injection 3 mL  3 mL Intravenous Q12H Yarisbel Miranda, MD      . sodium chloride 0.9 % injection 3 mL  3 mL Intravenous Q12H Markeshia Giebel, MD      . sodium chloride 0.9 % injection 3 mL  3 mL Intravenous PRN Dede Query, MD  Allergies: Allergies as of 09/14/2013  . (No Known Allergies)   Past Medical History  Diagnosis Date  . Upper respiratory infection 12/12/2009  . Heart murmur, systolic 08/10/2009  . 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 Hypotension 05/26/2009  . Syncope 05/07/2009  . 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 Esophageal varices 11/07/2008  . Redness or discharge of eye 11/06/2008  . Skin tag 08/25/2008  . Gastric ulcer 10/09    antral, with h pylori positive  . Abdominal pain, acute, generalized 06/28/2008  . Congestive heart failure 03/06/2008  . Cellulitis and abscess of leg, except foot 03/06/2008  . Sinus tachycardia 03/06/2008  . Edema 03/06/2008  . Impacted cerumen of both ears 03/06/2008  . Secondary hyperparathyroidism 02/02/2008  . Mute 02/02/2008  . Mental retardation 02/02/2008  . Hyperlipidemia 02/02/2008  . Anemia  02/02/2008  . ESRD (end stage renal disease)     TTS hemodialysis  . GERD (gastroesophageal reflux disease)   . Hypertension 02/02/2008    in history  . Seizures     "non in a while at home"   Past Surgical History  Procedure Laterality Date  . Left forearm graft      for HD  . Arteriovenous graft placement  11/22/10    Right thigh AVG  . Thrombectomy and revision of arterioventous (av) goretex  graft    . Thrombectomy and revision of arterioventous (av) goretex  graft  10/22/2012    Procedure: THROMBECTOMY AND REVISION OF ARTERIOVENTOUS (AV) GORETEX  GRAFT;  Surgeon: Larina Earthly, MD;  Location: [facility] OR;  Service: Vascular;  Laterality: Right;  . Thrombectomy w/ embolectomy  11/10/2012    Procedure: THROMBECTOMY ARTERIOVENOUS GORE-TEX GRAFT;  Surgeon: Pryor Ochoa, MD;  Location: [facility] OR;  Service: Vascular;  Laterality: Right;  . Thrombectomy and revision of arterioventous (av) goretex  graft Right 12/08/2012    Procedure: THROMBECTOMY AND REVISION OF ARTERIOVENTOUS (AV) GORETEX  GRAFT right thigh;  Surgeon: Sherren Kerns, MD;  Location: [facility] OR;  Service: Vascular;  Laterality: Right;  Susie Cassette N/A 12/08/2012    Procedure: VENOGRAM;  Surgeon: Sherren Kerns, MD;  Location: [facility] OR;  Service: Vascular;  Laterality: N/A;  Intraoperative Central venogram  . Thrombectomy w/ embolectomy Right 12/12/2012    Procedure: THROMBECTOMY ARTERIOVENOUS GORE-TEX GRAFT;  Surgeon: Nada Libman, MD;  Location: [facility] OR;  Service: Vascular;  Laterality: Right;  . Insertion of dialysis catheter Left 12/14/2012    Procedure: INSERTION OF DIALYSIS CATHETER;  Surgeon: Nada Libman, MD;  Location: [facility] OR;  Service: Vascular;  Laterality: Left;  . Insertion of dialysis catheter Right 01/13/2013    Procedure: INSERTION OF DIALYSIS CATHETER;  Surgeon: Nada Libman, MD;  Location: [facility] OR;  Service: Vascular;  Laterality: Right;  . Removal of a dialysis catheter Left 01/13/2013    Procedure: REMOVAL OF A DIALYSIS  CATHETER;  Surgeon: Nada Libman, MD;  Location: [facility] OR;  Service: Vascular;  Laterality: Left;  . Av fistula placement Left 02/11/2013    Procedure: INSERTION OF ARTERIOVENOUS (AV) GORE-TEX GRAFT THIGH;  Surgeon: Nada Libman, MD;  Location: [facility] OR;  Service: Vascular;  Laterality: Left;  using 6mm x 50cm Gore-Tex Vascular Graft  . Esophagogastroduodenoscopy N/A 02/16/2013    Procedure: ESOPHAGOGASTRODUODENOSCOPY (EGD);  Surgeon: Vertell Novak., MD;  Location: [facility] ENDOSCOPY;  Service: Endoscopy;  Laterality: N/A;  bedside   History reviewed. No pertinent family history. History   Social History  . Marital Status: Single  Spouse Name: N/A    Number of Children: N/A  . Years of Education: N/A   Occupational History  . Not on file.   Social History Main Topics  . Smoking status: Never Smoker   . Smokeless tobacco: Never Used  . Alcohol Use: No  . Drug Use: No  . Sexual Activity: No   Other Topics Concern  . Not on file   Social History Narrative  . No narrative on file    Review of Systems: Review of Systems:  Constitutional:  Denies fever, chills, diaphoresis.  HEENT:  Denies congestion, sore throat, rhinorrhea, sneezing, mouth sores, trouble swallowing, neck pain   Respiratory:  Denies SOB, DOE, cough, and wheezing.   Cardiovascular:  Denies palpitations and leg swelling.   Gastrointestinal:  positive nausea, vomiting, bloody emesis. Denies abdominal pain, diarrhea, constipation, blood in stool and abdominal distention.   Genitourinary:  Denies dysuria, urgency, frequency, hematuria, flank pain and difficulty urinating.   Musculoskeletal:  Denies myalgias, back pain, joint swelling, arthralgias and gait problem.   Skin:  Denies pallor, rash and wound.   Neurological:  Denies dizziness, seizures, syncope, weakness, light-headedness, numbness and headaches.    .    Physical Exam: Blood pressure 94/61, pulse 98, temperature 98 F (36.7 C), temperature source  Oral, resp. rate 14, height 5\' 5"  (1.651 m), weight 123 lb 3.8 oz (55.9 kg), SpO2 100.00%. General: alert. Acute sickness  Head: normocephalic and atraumatic.  Eyes: vision grossly intact, pupils equal, pupils round, pupils reactive to light, no injection and anicteric.  Mouth: pharynx pink and moist, no erythema, and no exudates.  Neck: supple, full ROM, no thyromegaly, no JVD, and no carotid bruits.  Lungs: normal respiratory effort, no accessory muscle use, normal breath sounds, no crackles, and no wheezes. Heart: normal rate, regular rhythm, no murmur, no gallop, and no rub.  Abdomen: soft, non-tender, normal bowel sounds, no distention, no guarding, no rebound tenderness, no hepatomegaly, and no splenomegaly.  Msk: no joint swelling, no joint warmth, and no redness over joints.  Pulses: 2+ DP/PT pulses bilaterally Extremities: No cyanosis, clubbing, edema Neurologic: alert & oriented X3, cranial nerves II-XII intact, strength normal in all extremities, sensation intact to light touch, and gait normal.  Skin: turgor normal and no rashes.  Psych: Oriented X3, memory intact for recent and remote, normally interactive, good eye contact, not anxious appearing, and not depressed appearing.   Lab results: Basic Metabolic Panel:  Recent Labs  16/10/96 0720 09/14/13 0739  Gia Lusher 146* 143  K 3.4* 3.3*  CL 89* 89*  CO2 39*  --   GLUCOSE 137* 137*  BUN 22 25*  CREATININE 7.43* 7.60*  CALCIUM 10.5  --    Liver Function Tests:  Recent Labs  09/14/13 0720  AST 23  ALT 18  ALKPHOS 164*  BILITOT 0.3  PROT 9.4*  ALBUMIN 4.8    Recent Labs  09/14/13 0720  LIPASE 62*   CBC:  Recent Labs  09/14/13 0720 09/14/13 0739  WBC 9.3  --   NEUTROABS 6.9  --   HGB 13.3 14.6  HCT 40.4 43.0  MCV 99.3  --   PLT 198  --     Coagulation:  Recent Labs  09/14/13 0720  LABPROT 13.0  INR 1.00    Imaging results:  Dg Chest Port 1 View  09/14/2013   CLINICAL DATA:  Altered mental  status.  EXAM: PORTABLE CHEST - 1 VIEW  COMPARISON:  08/29/2013  FINDINGS: Heart is  normal size. No confluent airspace opacities. Low lung volumes. No effusions or acute bony abnormality.  IMPRESSION: No active disease.   Electronically Signed   By: Charlett Nose M.D.   On: 09/14/2013 10:52    Assessment & Plan by Problem:  Patient is a 62 year old male with a PMH of ESRD on HD TTS, portal hypertension and esophageal varices and prior gastric ulcer, superior vena cava syndrome, mental retardation and mutism, and epilepsy who presents emergency department via EMS with hematemesis.   # Shock and lactic lactosis    Patient presents with tachycardia HR 125 and hypotension 79/40 in setting of hematemesis since early am on admission.  Denies recent sickness, fever, chills, SOB, or chest pain. He responded well to IVF resuscitation, his VS stabilized within 3-4 hours of IVF. The etiology of his shock is likely due to hypovolemia 2/2 hematemesis. He just had HD yesterday. And family states that he usually had "low BP" after HD.       Sepsis is less likely since he is afebrile without leukocytosis.  No source of infection is identified on admission. CXR is negative. Unable to collect urine since he is HD pt. His lactic acidosis is likely representing tissue hypoperfusion from shock, which should trend down with fluid resuscitation. We will need to r/o adrenal insufficiency.        No indication of anaphylactic shock.  No new drugs or vasodilators. Other DDX including Pneumothorax, pulmonary embolus, cardiac tamponade, aortic dissection are likely given clinical manifestations.     - SDU - repeat lactic acid - A.m. cortisol level - Cycle Troponin and EKG to r/o cardiac etiology - IVF hydration--caution fluid overload in the setting of ESRD. - Pan cultures if spike a fever   # Hematemesis in the setting of esophgeal varices and hx of gastric ulcer in 2012.     Reported to have hematemesis. HH 13-14 on  admission. Baseline HGB 11-12.   - NPO - appreciate GI consult - IV octreotide and protonix drip - ABd U/s to evaluate possible liver disease - follow CBC  # ESRD with HD TTS - renal consulted for HD  # MENTAL RETARDATION and MUTE--unchanged  # History of Seizure, stable. - on home dose Dilantin - continue home dose    IV form while NPO--pharmacy consult for dosage  Resume oral while able - Dilantin level in am ( unable to find level)  # VTE: SCD in setting of Hematemesis  Dispo: Disposition is deferred at this time, awaiting improvement of current medical problems. Anticipated discharge in approximately 1-2 day(s).   The patient does have a current PCP Linward Headland, MD) and does need an [facility] hospital follow-up appointment after discharge.  The patient does not have transportation limitations that hinder transportation to clinic appointments.  SignedDede Query, [id number] IMTS 09/14/2013, 3:33 PM

## 2013-09-14 NOTE — Consult Note (Addendum)
[facility] CONSULT Reason for consult: Hematemsis Referring Physician: Medical Teaching Service  [name] is an 62 y.o. male.  HPI: he was admitted with hematemesis. He has a history of mutism and mental retardation. Most of the history is obtained from his sister was also his power of attorney. Apparently the patient was found by the caregiver with some bright blood around his mouth and nose with some nausea with some hematemesis. He was seen in the emergency room was hypotensive and given fluids and based on a history of liver disease in his records started on IV octreotide. He has had no further bleeding senses admission in his hemoglobin is 14.6 the patient's platelet count is 198. He carries a history of cirrhosis and varices according to his all records, but liver tests have been a normal and I can find no radiographic evidence of cirrhosis. 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 He has had G.I. bleeding 2 years previously with blood in the stomach with no active bleeding source found. He has a hemodialysis patients and apparently has had H. pylori in the past in biopsies and has had a previous history of gastric ulcers. According to the records, he takes protonix at home and his sister [name]. No history of NSAID use he does take acetaminophen. Other problems include seizure disorder. He has a history of. Her vena cava syndrome myocolonus  and reflux.  Past Medical History  Diagnosis Date  . Upper respiratory infection 12/12/2009  . Heart murmur, systolic 08/10/2009  . 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 Hypotension 05/26/2009  . Syncope 05/07/2009  . 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 Esophageal varices 11/07/2008  . Redness or discharge of eye 11/06/2008  . Skin tag 08/25/2008  . Gastric ulcer 10/09    antral, with h pylori positive  . Abdominal pain, acute, generalized 06/28/2008  . Congestive  heart failure 03/06/2008  . Cellulitis and abscess of leg, except foot 03/06/2008  . Sinus tachycardia 03/06/2008  . Edema 03/06/2008  . Impacted cerumen of both ears 03/06/2008  . Secondary hyperparathyroidism 02/02/2008  . Mute 02/02/2008  . Mental retardation 02/02/2008  . Hyperlipidemia 02/02/2008  . Anemia 02/02/2008  . ESRD (end stage renal disease)     TTS hemodialysis  . GERD (gastroesophageal reflux disease)   . Hypertension 02/02/2008    in history  . Seizures     "non in a while at home"    Past Surgical History  Procedure Laterality Date  . Left forearm graft      for HD  . Arteriovenous graft placement  11/22/10    Right thigh AVG  . Thrombectomy and revision of arterioventous (av) goretex  graft    . Thrombectomy and revision of arterioventous (av) goretex  graft  10/22/2012    Procedure: THROMBECTOMY AND REVISION OF ARTERIOVENTOUS (AV) GORETEX  GRAFT;  Surgeon: Larina Earthly, MD;  Location: [facility] OR;  Service: Vascular;  Laterality: Right;  . Thrombectomy w/ embolectomy  11/10/2012    Procedure: THROMBECTOMY ARTERIOVENOUS GORE-TEX GRAFT;  Surgeon: Pryor Ochoa, MD;  Location: [facility] OR;  Service: Vascular;  Laterality: Right;  . Thrombectomy and revision of arterioventous (av) goretex  graft Right 12/08/2012    Procedure: THROMBECTOMY AND REVISION OF ARTERIOVENTOUS (AV) GORETEX  GRAFT right thigh;  Surgeon: Sherren Kerns, MD;  Location: [facility] OR;  Service: Vascular;  Laterality: Right;  Susie Cassette N/A 12/08/2012    Procedure: VENOGRAM;  Surgeon: Sherren Kerns, MD;  Location: [facility] OR;  Service: Vascular;  Laterality: N/A;  Intraoperative Central venogram  . Thrombectomy w/ embolectomy Right 12/12/2012    Procedure: THROMBECTOMY ARTERIOVENOUS GORE-TEX GRAFT;  Surgeon: Nada Libman, MD;  Location: [facility] OR;  Service: Vascular;  Laterality: Right;  . Insertion of dialysis catheter Left 12/14/2012    Procedure: INSERTION OF DIALYSIS CATHETER;  Surgeon: Nada Libman, MD;   Location: [facility] OR;  Service: Vascular;  Laterality: Left;  . Insertion of dialysis catheter Right 01/13/2013    Procedure: INSERTION OF DIALYSIS CATHETER;  Surgeon: Nada Libman, MD;  Location: [facility] OR;  Service: Vascular;  Laterality: Right;  . Removal of a dialysis catheter Left 01/13/2013    Procedure: REMOVAL OF A DIALYSIS CATHETER;  Surgeon: Nada Libman, MD;  Location: [facility] OR;  Service: Vascular;  Laterality: Left;  . Av fistula placement Left 02/11/2013    Procedure: INSERTION OF ARTERIOVENOUS (AV) GORE-TEX GRAFT THIGH;  Surgeon: Nada Libman, MD;  Location: [facility] OR;  Service: Vascular;  Laterality: Left;  using 6mm x 50cm Gore-Tex Vascular Graft  . Esophagogastroduodenoscopy N/A 02/16/2013    Procedure: ESOPHAGOGASTRODUODENOSCOPY (EGD);  Surgeon: Vertell Novak., MD;  Location: [facility] ENDOSCOPY;  Service: Endoscopy;  Laterality: N/A;  bedside    No family history on file.  Social History:  reports that he has never smoked. He has never used smokeless tobacco. He reports that he does not drink alcohol or use illicit drugs.  Allergies: No Known Allergies  Medications; Prior to Admission medications   Medication Sig Start Date End Date Taking? Authorizing Provider  acetaminophen (TYLENOL) 325 MG tablet Take 2 tablets (650 mg total) by mouth every 6 (six) hours as needed for moderate pain. 08/29/13  Yes Manuela Schwartz, MD  atorvastatin (LIPITOR) 20 MG tablet Take 1 tablet (20 mg total) by mouth daily. 04/27/13  Yes Linward Headland, MD  clonazePAM (KLONOPIN) 0.5 MG tablet Take 0.5 tablets (0.25 mg total) by mouth 2 (two) times daily. 04/05/13  Yes Linward Headland, MD  folic acid-vitamin b complex-vitamin c-selenium-zinc (DIALYVITE) 3 MG TABS Take 1 tablet by mouth daily. 01/18/13  Yes Ulyess Mort, MD  midodrine (PROAMATINE) 10 MG tablet Take 15 mg by mouth 3 (three) times daily.    Yes Historical Provider, MD  pantoprazole (PROTONIX) 40 MG tablet Take 1 tablet (40 mg total) by mouth daily.  08/03/13  Yes Linward Headland, MD  phenytoin (DILANTIN) 100 MG ER capsule Take 2 capsules (200 mg total) by mouth at bedtime. 03/02/13  Yes Nilda Riggs, NP  sevelamer carbonate (RENVELA) 800 MG tablet Take 800 mg by mouth 3 (three) times daily with meals.   Yes Historical Provider, MD   . phenytoin (DILANTIN) IV  200 mg Intravenous QHS  . sodium chloride  3 mL Intravenous Q12H  . sodium chloride  3 mL Intravenous Q12H   PRN Meds sodium chloride, ondansetron (ZOFRAN) IV, sodium chloride Results for orders placed during the hospital encounter of 09/14/13 (from the past 48 hour(s))  CBC WITH DIFFERENTIAL     Status: Abnormal   Collection Time    09/14/13  7:20 AM      Result Value Range   WBC 9.3  4.0 - 10.5 K/uL   RBC 4.07 (*) 4.22 - 5.81 MIL/uL   Hemoglobin 13.3  13.0 - 17.0 g/dL   HCT 16.1  09.6 - 04.5 %   MCV 99.3  78.0 - 100.0 fL  MCH 32.7  26.0 - 34.0 pg   MCHC 32.9  30.0 - 36.0 g/dL   RDW 40.9  81.1 - 91.4 %   Platelets 198  150 - 400 K/uL   Neutrophils Relative % 75  43 - 77 %   Neutro Abs 6.9  1.7 - 7.7 K/uL   Lymphocytes Relative 16  12 - 46 %   Lymphs Abs 1.5  0.7 - 4.0 K/uL   Monocytes Relative 9  3 - 12 %   Monocytes Absolute 0.9  0.1 - 1.0 K/uL   Eosinophils Relative 0  0 - 5 %   Eosinophils Absolute 0.0  0.0 - 0.7 K/uL   Basophils Relative 0  0 - 1 %   Basophils Absolute 0.0  0.0 - 0.1 K/uL  COMPREHENSIVE METABOLIC PANEL     Status: Abnormal   Collection Time    09/14/13  7:20 AM      Result Value Range   Sodium 146 (*) 135 - 145 mEq/L   Potassium 3.4 (*) 3.5 - 5.1 mEq/L   Chloride 89 (*) 96 - 112 mEq/L   CO2 39 (*) 19 - 32 mEq/L   Glucose, Bld 137 (*) 70 - 99 mg/dL   BUN 22  6 - 23 mg/dL   Creatinine, Ser 7.82 (*) 0.50 - 1.35 mg/dL   Calcium 95.6  8.4 - 21.3 mg/dL   Total Protein 9.4 (*) 6.0 - 8.3 g/dL   Albumin 4.8  3.5 - 5.2 g/dL   AST 23  0 - 37 U/L   ALT 18  0 - 53 U/L   Alkaline Phosphatase 164 (*) 39 - 117 U/L   Total Bilirubin 0.3  0.3 -  1.2 mg/dL   GFR calc non Af Amer 7 (*) >90 mL/min   GFR calc Af Amer 8 (*) >90 mL/min   Comment: (NOTE)     The eGFR has been calculated using the CKD EPI equation.     This calculation has not been validated in all clinical situations.     eGFR's persistently <90 mL/min signify possible Chronic Kidney     Disease.  LIPASE, BLOOD     Status: Abnormal   Collection Time    09/14/13  7:20 AM      Result Value Range   Lipase 62 (*) 11 - 59 U/L  PROTIME-INR     Status: None   Collection Time    09/14/13  7:20 AM      Result Value Range   Prothrombin Time 13.0  11.6 - 15.2 seconds   INR 1.00  0.00 - 1.49  SAMPLE TO BLOOD BANK     Status: None   Collection Time    09/14/13  7:20 AM      Result Value Range   Blood Bank Specimen SAMPLE AVAILABLE FOR TESTING     Sample Expiration 09/15/2013    CG4 I-STAT (LACTIC ACID)     Status: Abnormal   Collection Time    09/14/13  7:39 AM      Result Value Range   Lactic Acid, Venous 4.91 (*) 0.5 - 2.2 mmol/L  POCT I-STAT, CHEM 8     Status: Abnormal   Collection Time    09/14/13  7:39 AM      Result Value Range   Sodium 143  135 - 145 mEq/L   Potassium 3.3 (*) 3.5 - 5.1 mEq/L   Chloride 89 (*) 96 - 112 mEq/L   BUN 25 (*) 6 -  23 mg/dL   Creatinine, Ser 1.61 (*) 0.50 - 1.35 mg/dL   Glucose, Bld 096 (*) 70 - 99 mg/dL   Calcium, Ion 0.45 (*) 1.13 - 1.30 mmol/L   TCO2 40  0 - 100 mmol/L   Hemoglobin 14.6  13.0 - 17.0 g/dL   HCT 40.9  81.1 - 91.4 %  OCCULT BLOOD, POC DEVICE     Status: Abnormal   Collection Time    09/14/13  7:42 AM      Result Value Range   Fecal Occult Bld POSITIVE (*) NEGATIVE    Dg Chest Port 1 View  09/14/2013   CLINICAL DATA:  Altered mental status.  EXAM: PORTABLE CHEST - 1 VIEW  COMPARISON:  08/29/2013  FINDINGS: Heart is normal size. No confluent airspace opacities. Low lung volumes. No effusions or acute bony abnormality.  IMPRESSION: No active disease.   Electronically Signed   By: Charlett Nose M.D.   On:  09/14/2013 10:52               Blood pressure 103/58, pulse 88, temperature 97.3 F (36.3 C), temperature source Oral, resp. rate 11, SpO2 100.00%.  Physical exam:   General-- African-American male in no distress. Obvious some retardation Heart--normal RRR w/o  M/G Lungs--clear  Abdomen-- soft nontender   Assessment: 1. Hematemesis. This is the 3rd time the patient has had this over the past several years. It is not clear if this is nosebleed or coming from his upper G.I. tract. I think it would be helpful to have an endoscopy to see if there is signs of active or recently in the stomach. Hemoglobin is quite good. 2. Has diagnosis of cirrhosis/varices/portal hypertension. I cannot find anything to document this diagnosis in the records. Last 2 endoscopies have not shown obvious varices. Last CT scan in the system 2009 showed normal liver and spleen. current labs show were normal liver function tests. At this point, I would have to question that diagnosis. 3. ESRD on hemodialysis with multiple vascular issues 4. History of seizure disorder 5. Mental retardation  Plan: 1. Will proceed with diagnostic EGD later today. I have discussed this with the patient's sister who is his POA. 2. When he is stable from his G.I. bleeding, would consider ultrasound of his liver to evaluate for any signs of chronic liver disease or a CT.   Meika Earll JR,Deran Barro L 09/14/2013, 1:18 PM     Received old endoscopy report from 10/09. This was done by Dr. Matthias Hughs. An antral ulcer was found as the source of bleeding. At that time the patient was felt to have mild esophageal varices in the lower part of the esophagus. This apparently was the source of his continuing history of cirrhosis.

## 2013-09-14 NOTE — Op Note (Signed)
[facility] [address] [place], [address]   ENDOSCOPY PROCEDURE REPORT  PATIENT: [name], [name]  MR#: [id number] BIRTHDATE: 1951-06-09 , 62  yrs. old GENDER: Male ENDOSCOPIST:Rolanda Campa Randa Evens, MD REFERRED BY: dialysis patient,medical teaching service PROCEDURE DATE:  09/14/2013 PROCEDURE:EGD ASA CLASS:  class III INDICATIONS:  hematemesis MEDICATION: fentanyl 12.5 mcg, versed 4 mg IV TOPICAL ANESTHETIC:    cetacaine spray  DESCRIPTION OF PROCEDURE:   The procedure had been explained to the patient's sister who is his POA and consent obtain. The patient had previous EGD is following history of hematemesis without previous bleeding sites being discovered. The Pentax upper scope inserted into the esophagus with swallowing. No bleeding in the distal esophagus and no gross evidence of esophageal varices despite question of varices on endoscopy 5 years earlier. Mild erosive esophagitis above GE Junction not actively bleeding. Stomach entered. A moderate amount of coffee ground material coating the stomach. No active bleeding. Duodenum entered and marked modularity seen in the duodenal bulb as in previous endoscopies without active bleeding. 2nd duodenum was normal. Scope is withdrawn back into the stomach and the stomach was vigorously irrigated. No active bleeding was seen. No bleeding lesions were seen. Specifically there were no AVMs or other gross lesions seen such as ulcerations. There are a very few gastroenterology and the immediate antral area. The scope is withdrawn in the initial findings were confirmed. Patient tolerated procedure well.     COMPLICATIONS: None  ENDOSCOPIC IMPRESSION: 1. Upper G.I. Bleeding. No clear source seen although coffee ground material in the stomach. This could be due to AVM no ulceration seen. There was very mild esophageal erosions and mild gastric erosions into the antrum. 2. No gross evidence of esophageal  varices  RECOMMENDATIONS: 1. Continue Protonix 2. Consider a repeat EGD if continues to bleed 3. What favor stopping the octreotide in the morning if hemoglobin stableand youthe 4. We'll give patient clear liquids   _______________________________ eSignedCarman Ching, MD 09/14/2013 3:09 PM  CC: Dr Josem Kaufmann, Dr Deterding   PATIENT NAME:  [name], [name] MR#: [id number]

## 2013-09-14 NOTE — ED Notes (Signed)
Restraints not applied throughout care

## 2013-09-14 NOTE — Interval H&P Note (Signed)
History and Physical Interval Note:  09/14/2013 1:44 PM  [name]  has presented today for surgery, with the diagnosis of hematemesis  The various methods of treatment have been discussed with the patient and family. After consideration of risks, benefits and other options for treatment, the patient has consented to  Procedure(s) with comments: ESOPHAGOGASTRODUODENOSCOPY (EGD) (N/A) - control of bleeding if needed as a surgical intervention .  The patient's history has been reviewed, patient examined, no change in status, stable for surgery.  I have reviewed the patient's chart and labs.  Questions were answered to the patient's satisfaction.     Ruxin Ransome JR,Soloman Mckeithan L

## 2013-09-14 NOTE — ED Notes (Signed)
Pt alert, pt cooperative, not needing restraints at this time. Pt following commands. Pt nonverbal, pt hx of MR, mentation at baseline

## 2013-09-14 NOTE — ED Notes (Signed)
X-ray at bedside

## 2013-09-14 NOTE — ED Notes (Addendum)
Per ems-- pt found with blood around him unsure if he vomited or had nose bleed at 2am. Pt is dialysis pt with shunt in leg. Pt hypotensive. Pt has MR and is non verbal. Pt had episode of vomiting blood 6 months ago

## 2013-09-15 LAB — RENAL FUNCTION PANEL
Albumin: 3.5 g/dL (ref 3.5–5.2)
Albumin: 3.5 g/dL (ref 3.5–5.2)
BUN: 34 mg/dL — ABNORMAL HIGH (ref 6–23)
CO2: 28 mEq/L (ref 19–32)
CO2: 28 mEq/L (ref 19–32)
Calcium: 9.4 mg/dL (ref 8.4–10.5)
Calcium: 9.6 mg/dL (ref 8.4–10.5)
Creatinine, Ser: 8.88 mg/dL — ABNORMAL HIGH (ref 0.50–1.35)
Creatinine, Ser: 8.88 mg/dL — ABNORMAL HIGH (ref 0.50–1.35)
GFR calc Af Amer: 6 mL/min — ABNORMAL LOW (ref >90)
GFR calc Af Amer: 6 mL/min — ABNORMAL LOW (ref >90)
GFR calc non Af Amer: 6 mL/min — ABNORMAL LOW (ref >90)
Phosphorus: 5.3 mg/dL — ABNORMAL HIGH (ref 2.3–4.6)
Potassium: 4.3 mEq/L (ref 3.5–5.1)
Sodium: 146 mEq/L — ABNORMAL HIGH (ref 135–145)

## 2013-09-15 LAB — CBC
HCT: 40.2 % (ref 39.0–52.0)
MCH: 31.3 pg (ref 26.0–34.0)
MCV: 98.3 fL (ref 78.0–100.0)
Platelets: 129 10*3/uL — ABNORMAL LOW (ref 150–400)
RBC: 4.09 MIL/uL — ABNORMAL LOW (ref 4.22–5.81)
WBC: 3.3 10*3/uL — ABNORMAL LOW (ref 4.0–10.5)

## 2013-09-15 LAB — LACTIC ACID, PLASMA: Lactic Acid, Venous: 2 mmol/L (ref 0.5–2.2)

## 2013-09-15 LAB — MAGNESIUM: Magnesium: 2 mg/dL (ref 1.5–2.5)

## 2013-09-15 MED ORDER — DOXERCALCIFEROL 4 MCG/2ML IV SOLN
12.0000 ug | INTRAVENOUS | Status: DC
  Administered 2013-09-16: 12 ug via INTRAVENOUS
  Filled 2013-09-15: qty 6

## 2013-09-15 MED ORDER — ONDANSETRON HCL 4 MG PO TABS: 4.0000 mg | ORAL_TABLET | Freq: Three times a day (TID) | ORAL | Status: DC | PRN

## 2013-09-15 MED ORDER — PANTOPRAZOLE SODIUM 40 MG PO TBEC
40.0000 mg | DELAYED_RELEASE_TABLET | Freq: Two times a day (BID) | ORAL | Status: DC
  Filled 2013-09-15: qty 1

## 2013-09-15 MED ORDER — SODIUM CHLORIDE 0.9 % IV SOLN
8.0000 mg/h | INTRAVENOUS | Status: DC
  Administered 2013-09-15 (×2): 8 mg/h via INTRAVENOUS
  Filled 2013-09-15 (×7): qty 80

## 2013-09-15 MED ORDER — SODIUM CHLORIDE 0.9 % IV SOLN: 200.0000 mg | Freq: Every day | INTRAVENOUS | Status: DC

## 2013-09-15 NOTE — Progress Notes (Signed)
[facility] PROGRESS NOTE Subjective Pt w/o further gross bleeding Hg down a little ? Fluids. Per staff is refusing to take any liquids  Objective: Vital signs in last 24 hours: Temp:  [97.6 F (36.4 C)-98.4 F (36.9 C)] 98.1 F (36.7 C) (11/27 0724) Pulse Rate:  [85-129] 90 (11/27 0724) Resp:  [10-21] 11 (11/27 0724) BP: (93-127)/(55-75) 97/55 mmHg (11/27 0724) SpO2:  [98 %-100 %] 100 % (11/27 0724) Weight:  [55.9 kg (123 lb 3.8 oz)-56 kg (123 lb 7.3 oz)] 56 kg (123 lb 7.3 oz) (11/27 0500)    Intake/Output from previous day: 11/26 0701 - 11/27 0700 In: 2400 [I.V.:2400] Out: -  Intake/Output this shift: Total I/O In: 20 [I.V.:20] Out: -   PE: General--NAD lying in bed Heart-- Lungs-- Abdomen--soft nontender good BSs  Lab Results:  Recent Labs  09/14/13 0720 09/14/13 0739 09/15/13 0500  WBC 9.3  --  3.3*  HGB 13.3 14.6 12.8*  HCT 40.4 43.0 40.2  PLT 198  --  129*   BMET  Recent Labs  09/14/13 0720 09/14/13 0739 09/15/13 0500  NA 146* 143 146*  146*  K 3.4* 3.3* 4.2  4.3  CL 89* 89* 101  101  CO2 39*  --  28  28  CREATININE 7.43* 7.60* 8.88*  8.88*   LFT  Recent Labs  09/14/13 0720  PROT 9.4*  AST 23  ALT 18  ALKPHOS 164*  BILITOT 0.3   PT/INR  Recent Labs  09/14/13 0720  LABPROT 13.0  INR 1.00   PANCREAS  Recent Labs  09/14/13 0720  LIPASE 62*         Studies/Results: US Abdomen Complete  09/14/2013   CLINICAL DATA:  Evaluate for cirrhosis. Elevated LFTs. History of end-stage renal disease.  EXAM: ULTRASOUND ABDOMEN COMPLETE  COMPARISON:  Abdominal CT 05/18/2008  FINDINGS: Gallbladder:  Echogenic structures within the gallbladder are consistent with stones. No gallbladder wall thickening. Patient does not have a sonographic Murphy's sign. Gallbladder is mildly distended.  Common bile duct:  Diameter: Measures 0.3 cm.  Liver:  Liver parenchyma is heterogeneous but no specific signs for cirrhosis. Main portal  vein is patent.  IVC:  No abnormality visualized.  Pancreas:  Not visualized.  Spleen:  Spleen measures 7.9 cm in length without focal abnormality.  Right Kidney:  Length: 9.1 cm. Right kidney is diffusely heterogeneous and contains multiple cysts. Large calcification in the right kidney upper pole measures 1.2 cm. Right kidney is echogenic and poorly characterized. Largest right kidney cyst measures up to 2.5 cm.  Left Kidney:  Length: 8.9 cm. Left kidney is very heterogeneous and there are multiple cystic and nodular structures throughout the kidney. Largest cystic structure measures up to 4.4 cm. Left kidney is very echogenic.  Abdominal aorta:  Mid and distal aorta are not visualized.  IMPRESSION: No gross abnormality to the liver. No evidence for biliary dilatation.  Cholelithiasis.  Both kidneys are very difficult to visualize and have cysts. Suspect that many of the cysts are complex and poorly characterized on this examination. Both kidneys are echogenic and consistent with chronic medical renal disease.  Large calcification in the right kidney upper pole.   Electronically Signed   By: Richarda Overlie M.D.   On: 09/14/2013 16:28   Dg Chest Port 1 View  09/14/2013   CLINICAL DATA:  Altered mental status.  EXAM: PORTABLE CHEST - 1 VIEW  COMPARISON:  08/29/2013  FINDINGS: Heart is normal size. No confluent airspace opacities. Low  lung volumes. No effusions or acute bony abnormality.  IMPRESSION: No active disease.   Electronically Signed   By: Charlett Nose M.D.   On: 09/14/2013 10:52    Medications: I have reviewed the patient's current medications.  Assessment/Plan: 1. Hematemesis. Cause ? Mild erosions in antrum and esophagus. No gross varices. Would stop octreotide and will advance to full liquids. When on floor, would check [place] of liver to look for radigraphic signs of cirrhosis   Brianna Bennett JR,Corene Resnick L 09/15/2013, 9:17 AM

## 2013-09-15 NOTE — Progress Notes (Addendum)
Subjective: [name] is doing well this morning.  He does not seem to be uncomfortable.   Objective: Vital signs in last 24 hours: Filed Vitals:   09/14/13 2349 09/15/13 0400 09/15/13 0500 09/15/13 0724  BP: 99/56 93/59  97/55  Pulse: 95 93  90  Temp: 98 F (36.7 C) 98.1 F (36.7 C)  98.1 F (36.7 C)  TempSrc: Oral Oral  Oral  Resp: 15 12  11   Height:      Weight:   123 lb 7.3 oz (56 kg)   SpO2: 98% 100%  100%   Weight change:   Intake/Output Summary (Last 24 hours) at 09/15/13 0842 Last data filed at 09/15/13 0800  Gross per 24 hour  Intake   1420 ml  Output      0 ml  Net   1420 ml   PEX General: alert, somewhat cooperative, and in no apparent distress HEENT: NCAT Neck: supple, no lymphadenopathy Lungs: clear to ascultation bilaterally, normal work of respiration, no wheezes, rales, ronchi Heart: regular rate and rhythm, no murmurs, gallops, or rubs Abdomen: soft, non-tender, non-distended, normal bowel sounds Extremities: 2+ DP/PT pulses bilaterally, no cyanosis, clubbing, or edema Neurologic: alert & oriented X3, cranial nerves II-XII intact, strength grossly intact, sensation intact to light touch  Lab Results: Basic Metabolic Panel:  Recent Labs Lab 09/14/13 0720 09/14/13 0739 09/15/13 0500  NA 146* 143 146*  146*  K 3.4* 3.3* 4.2  4.3  CL 89* 89* 101  101  CO2 39*  --  28  28  GLUCOSE 137* 137* 111*  113*  BUN 22 25* 33*  34*  CREATININE 7.43* 7.60* 8.88*  8.88*  CALCIUM 10.5  --  9.4  9.6  MG  --   --  2.0  2.0  PHOS  --   --  5.2*  5.3*   Liver Function Tests:  Recent Labs Lab 09/14/13 0720 09/15/13 0500  AST 23  --   ALT 18  --   ALKPHOS 164*  --   BILITOT 0.3  --   PROT 9.4*  --   ALBUMIN 4.8 3.5  3.5    Recent Labs Lab 09/14/13 0720  LIPASE 62*   CBC:  Recent Labs Lab 09/14/13 0720 09/14/13 0739 09/15/13 0500  WBC 9.3  --  3.3*  NEUTROABS 6.9  --   --   HGB 13.3 14.6 12.8*  HCT 40.4 43.0 40.2  MCV 99.3   --  98.3  PLT 198  --  129*   Cardiac Enzymes:  Recent Labs Lab 09/15/13 0500  TROPONINI <0.30   Coagulation:  Recent Labs Lab 09/14/13 0720  LABPROT 13.0  INR 1.00    Micro Results: Recent Results (from the past 240 hour(s))  MRSA PCR SCREENING     Status: None   Collection Time    09/14/13 12:08 PM      Result Value Range Status   MRSA by PCR NEGATIVE  NEGATIVE Final   Comment:            The GeneXpert MRSA Assay (FDA     approved for NASAL specimens     only), is one component of a     comprehensive MRSA colonization     surveillance program. It is not     intended to diagnose MRSA     infection nor to guide or     monitor treatment for     MRSA infections.   Studies/Results: US Abdomen Complete  09/14/2013   CLINICAL DATA:  Evaluate for cirrhosis. Elevated LFTs. History of end-stage renal disease.  EXAM: ULTRASOUND ABDOMEN COMPLETE  COMPARISON:  Abdominal CT 05/18/2008  FINDINGS: Gallbladder:  Echogenic structures within the gallbladder are consistent with stones. No gallbladder wall thickening. Patient does not have a sonographic Murphy's sign. Gallbladder is mildly distended.  Common bile duct:  Diameter: Measures 0.3 cm.  Liver:  Liver parenchyma is heterogeneous but no specific signs for cirrhosis. Main portal vein is patent.  IVC:  No abnormality visualized.  Pancreas:  Not visualized.  Spleen:  Spleen measures 7.9 cm in length without focal abnormality.  Right Kidney:  Length: 9.1 cm. Right kidney is diffusely heterogeneous and contains multiple cysts. Large calcification in the right kidney upper pole measures 1.2 cm. Right kidney is echogenic and poorly characterized. Largest right kidney cyst measures up to 2.5 cm.  Left Kidney:  Length: 8.9 cm. Left kidney is very heterogeneous and there are multiple cystic and nodular structures throughout the kidney. Largest cystic structure measures up to 4.4 cm. Left kidney is very echogenic.  Abdominal aorta:  Mid and  distal aorta are not visualized.  IMPRESSION: No gross abnormality to the liver. No evidence for biliary dilatation.  Cholelithiasis.  Both kidneys are very difficult to visualize and have cysts. Suspect that many of the cysts are complex and poorly characterized on this examination. Both kidneys are echogenic and consistent with chronic medical renal disease.  Large calcification in the right kidney upper pole.   Electronically Signed   By: Richarda Overlie M.D.   On: 09/14/2013 16:28   Dg Chest Port 1 View  09/14/2013   CLINICAL DATA:  Altered mental status.  EXAM: PORTABLE CHEST - 1 VIEW  COMPARISON:  08/29/2013  FINDINGS: Heart is normal size. No confluent airspace opacities. Low lung volumes. No effusions or acute bony abnormality.  IMPRESSION: No active disease.   Electronically Signed   By: Charlett Nose M.D.   On: 09/14/2013 10:52   Medications: I have reviewed the patient's current medications. Scheduled Meds: . phenytoin (DILANTIN) IV  200 mg Intravenous QHS  . sodium chloride  3 mL Intravenous Q12H   Continuous Infusions: . sodium chloride 50 mL/hr at 09/14/13 1300  . sodium chloride 20 mL/hr at 09/15/13 0800  . pantoprozole (PROTONIX) infusion 8 mg/hr (09/15/13 0800)   Assessment/Plan: # Suspected upper GI bleed- patient presented with hematemesis in early AM yesterday, source unclear.  Patient has history of esophageal varices and gastric ulcer seen on EGD in 2009.  EGD with GI (Dr. Randa Evens) yesterday which showed no clear source though coffee ground material in stomach which could be due to AVM; mild esophageal erosions and mild gastric erosions in antrum; no evidence of esophageal varices. Lactic acid 4.9 --> 2.0.  Abdominal ultrasound showed no evidence of cirrhosis. H/H 13-14 on admission, baseline Hgb 11-12; H/H stable (Hgb 12.8 today).  -transfer to med-surg -GI consult, appreciate recs; repeat EGD if recurrent hematemesis -discontinue octreotide gtt per GI recs -continue Protonix,  tried to convert to 40 mg PO BID this morning but patient refusing all PO meds so will have to restart gtt -full liquid diet  -CBC in AM   # ESRD on HD TTS- Cr 8.88, Phos 5.3 on AM labs - renal consulted --> dialysis tomorrow prior to discharge   # Epilepsy- stable though dilantin level sub-therapeutic (4.9) - continue home dose Dilantin, tried to convert to PO this morning, but patient refusing all PO meds so  will have to restart IV  # DVT PPX: SCDs in setting of hematemesis   Dispo: Disposition is deferred at this time, awaiting improvement of current medical problems.  Anticipated discharge in approximately 1-2 day(s).   The patient does have a current PCP Linward Headland, MD) and does need an [facility] hospital follow-up appointment after discharge.   .Services Needed at time of discharge: Y = Yes, Blank = No PT:   OT:   RN:   Equipment:   Other:     LOS: 1 day   Rocco Serene, MD 09/15/2013, 8:42 AM

## 2013-09-15 NOTE — Progress Notes (Signed)
Assessment/Plan:  1. Hematemesis with mild erosions in antrum and esphagus 2. ESRD - TTS - s/p HD yesterday; inpatient unit closed today due to holiday. Should be able to wait until Friday for next HD - hold heparin K 3.3 3. Hypertension/volume - stop IVFs; family needs to assist with nutrition(he takes nothing from the staff) 4. Anemia - Hgb 12.8 - mild drop 5. Metabolic bone disease -order hectoral 6. Sz d/o - getting IV dilantin   Subjective: Interval History: none.  Objective: Vital signs in last 24 hours: Temp:  [97.6 F (36.4 C)-98.4 F (36.9 C)] 98.1 F (36.7 C) (11/27 0724) Pulse Rate:  [85-129] 90 (11/27 0724) Resp:  [10-21] 11 (11/27 0724) BP: (93-127)/(55-75) 97/55 mmHg (11/27 0724) SpO2:  [98 %-100 %] 100 % (11/27 0724) Weight:  [55.9 kg (123 lb 3.8 oz)-56 kg (123 lb 7.3 oz)] 56 kg (123 lb 7.3 oz) (11/27 0500) Weight change:   Intake/Output from previous day: 11/26 0701 - 11/27 0700 In: 2400 [I.V.:2400] Out: -  Intake/Output this shift: Total I/O In: 20 [I.V.:20] Out: -   General appearance: slowed mentation, mute Resp: clear to auscultation bilaterally Cardio: regular rate and rhythm, S1, S2 normal, no murmur, click, rub or gallop Extremities: extremities normal, atraumatic, no cyanosis or edema  Lab Results:  Recent Labs  09/14/13 0720 09/14/13 0739 09/15/13 0500  WBC 9.3  --  3.3*  HGB 13.3 14.6 12.8*  HCT 40.4 43.0 40.2  PLT 198  --  129*   BMET:  Recent Labs  09/14/13 0720 09/14/13 0739 09/15/13 0500  NA 146* 143 146*  146*  K 3.4* 3.3* 4.2  4.3  CL 89* 89* 101  101  CO2 39*  --  28  28  GLUCOSE 137* 137* 111*  113*  BUN 22 25* 33*  34*  CREATININE 7.43* 7.60* 8.88*  8.88*  CALCIUM 10.5  --  9.4  9.6   No results found for this basename: PTH,  in the last 72 hours Iron Studies: No results found for this basename: IRON, TIBC, TRANSFERRIN, FERRITIN,  in the last 72 hours Studies/Results: US Abdomen Complete  09/14/2013    CLINICAL DATA:  Evaluate for cirrhosis. Elevated LFTs. History of end-stage renal disease.  EXAM: ULTRASOUND ABDOMEN COMPLETE  COMPARISON:  Abdominal CT 05/18/2008  FINDINGS: Gallbladder:  Echogenic structures within the gallbladder are consistent with stones. No gallbladder wall thickening. Patient does not have a sonographic Murphy's sign. Gallbladder is mildly distended.  Common bile duct:  Diameter: Measures 0.3 cm.  Liver:  Liver parenchyma is heterogeneous but no specific signs for cirrhosis. Main portal vein is patent.  IVC:  No abnormality visualized.  Pancreas:  Not visualized.  Spleen:  Spleen measures 7.9 cm in length without focal abnormality.  Right Kidney:  Length: 9.1 cm. Right kidney is diffusely heterogeneous and contains multiple cysts. Large calcification in the right kidney upper pole measures 1.2 cm. Right kidney is echogenic and poorly characterized. Largest right kidney cyst measures up to 2.5 cm.  Left Kidney:  Length: 8.9 cm. Left kidney is very heterogeneous and there are multiple cystic and nodular structures throughout the kidney. Largest cystic structure measures up to 4.4 cm. Left kidney is very echogenic.  Abdominal aorta:  Mid and distal aorta are not visualized.  IMPRESSION: No gross abnormality to the liver. No evidence for biliary dilatation.  Cholelithiasis.  Both kidneys are very difficult to visualize and have cysts. Suspect that many of the cysts are complex and poorly  characterized on this examination. Both kidneys are echogenic and consistent with chronic medical renal disease.  Large calcification in the right kidney upper pole.   Electronically Signed   By: Richarda Overlie M.D.   On: 09/14/2013 16:28   Dg Chest Port 1 View  09/14/2013   CLINICAL DATA:  Altered mental status.  EXAM: PORTABLE CHEST - 1 VIEW  COMPARISON:  08/29/2013  FINDINGS: Heart is normal size. No confluent airspace opacities. Low lung volumes. No effusions or acute bony abnormality.  IMPRESSION: No active  disease.   Electronically Signed   By: Charlett Nose M.D.   On: 09/14/2013 10:52   Scheduled: . pantoprazole  40 mg Oral BID  . phenytoin (DILANTIN) IV  200 mg Intravenous QHS  . sodium chloride  3 mL Intravenous Q12H    LOS: 1 day   Tyonna Talerico C 09/15/2013,9:58 AM

## 2013-09-15 NOTE — Progress Notes (Signed)
Pt transferred to 6N07 per MD order. Report called to receiving nurse and all questions answered.

## 2013-09-15 NOTE — H&P (Signed)
INTERNAL MEDICINE TEACHING SERVICE Attending Admission Note  Date: 09/15/2013  Patient name: [name]  Medical record number: [id number]  Date of birth: 18-Apr-1951    I have seen and evaluated [name] and discussed their care with the Residency Team.  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 The hx cannot be obtained from the patient due to mutism and was obtained from chart review and sisters. He is s/p EGD with findings of erosion in antrum and esophagus. AVM? Liver U/S without evidence of cirrhosis. At this time, agree with advancing to liquids. Agree with D/C octreotide and continuing to monitor H/H. No evidence of active bleed at this time. BID PPI.  Jonah Blue, DO, FACP Faculty [facility] Res[facility] 09/15/2013, 11:04 AM

## 2013-09-16 LAB — CBC
HCT: 27.1 % — ABNORMAL LOW (ref 39.0–52.0)
Hemoglobin: 9.2 g/dL — ABNORMAL LOW (ref 13.0–17.0)
MCH: 32.6 pg (ref 26.0–34.0)
MCHC: 33.2 g/dL (ref 30.0–36.0)
MCV: 98.2 fL (ref 78.0–100.0)
MCV: 97.2 fL (ref 78.0–100.0)
Platelets: 149 10*3/uL — ABNORMAL LOW (ref 150–400)
Platelets: 165 10*3/uL (ref 150–400)
RBC: 2.87 MIL/uL — ABNORMAL LOW (ref 4.22–5.81)
RDW: 14.4 % (ref 11.5–15.5)
RDW: 14 % (ref 11.5–15.5)
WBC: 3.7 10*3/uL — ABNORMAL LOW (ref 4.0–10.5)
WBC: 4.5 10*3/uL (ref 4.0–10.5)

## 2013-09-16 LAB — CORTISOL-AM, BLOOD: Cortisol - AM: 9.7 ug/dL (ref 4.3–22.4)

## 2013-09-16 MED ORDER — LIDOCAINE HCL (PF) 1 % IJ SOLN: 5.0000 mL | INTRAMUSCULAR | Status: DC | PRN

## 2013-09-16 MED ORDER — LIDOCAINE-PRILOCAINE 2.5-2.5 % EX CREA
TOPICAL_CREAM | Freq: Once | CUTANEOUS | Status: AC
  Administered 2013-09-16: 12:00:00 via TOPICAL
  Filled 2013-09-16 (×2): qty 5

## 2013-09-16 MED ORDER — DOXERCALCIFEROL 4 MCG/2ML IV SOLN
INTRAVENOUS | Status: AC
  Filled 2013-09-16: qty 6

## 2013-09-16 MED ORDER — LIDOCAINE-PRILOCAINE 2.5-2.5 % EX CREA
1.0000 "application " | TOPICAL_CREAM | CUTANEOUS | Status: DC | PRN
  Filled 2013-09-16: qty 5

## 2013-09-16 MED ORDER — NEPRO/CARBSTEADY PO LIQD
237.0000 mL | ORAL | Status: DC | PRN
  Filled 2013-09-16: qty 237

## 2013-09-16 MED ORDER — SODIUM CHLORIDE 0.9 % IV SOLN: 100.0000 mL | INTRAVENOUS | Status: DC | PRN

## 2013-09-16 MED ORDER — PANTOPRAZOLE SODIUM 40 MG PO TBEC
40.0000 mg | DELAYED_RELEASE_TABLET | Freq: Two times a day (BID) | ORAL | Status: DC
  Administered 2013-09-16 – 2013-09-17 (×2): 40 mg via ORAL
  Filled 2013-09-16 (×3): qty 1

## 2013-09-16 MED ORDER — HEPARIN SODIUM (PORCINE) 1000 UNIT/ML DIALYSIS: 1000.0000 [IU] | INTRAMUSCULAR | Status: DC | PRN

## 2013-09-16 MED ORDER — PHENYTOIN SODIUM EXTENDED 100 MG PO CAPS
200.0000 mg | ORAL_CAPSULE | Freq: Every day | ORAL | Status: DC
  Filled 2013-09-16 (×2): qty 2

## 2013-09-16 MED ORDER — ALTEPLASE 2 MG IJ SOLR
2.0000 mg | Freq: Once | INTRAMUSCULAR | Status: DC | PRN
  Filled 2013-09-16: qty 2

## 2013-09-16 MED ORDER — PENTAFLUOROPROP-TETRAFLUOROETH EX AERO: 1.0000 "application " | INHALATION_SPRAY | CUTANEOUS | Status: DC | PRN

## 2013-09-16 NOTE — Progress Notes (Signed)
Subjective: [name] is doing well this morning, resting then awoke and waved.    Patient went for dialysis this morning but is refusing dialysis, blood draws, and PO meds.  Will call patient's sister and ask her to come to bedside as this has been most effective in past.   Objective: Vital signs in last 24 hours: Filed Vitals:   09/15/13 2115 09/16/13 0517 09/16/13 0709 09/16/13 0743  BP: 105/53 106/58 110/61 111/64  Pulse: 96 87 89 90  Temp: 97.1 F (36.2 C) 98.2 F (36.8 C) 98.1 F (36.7 C) 97.8 F (36.6 C)  TempSrc: Oral Oral Oral Oral  Resp: 20 20 20 20   Height:      Weight:  124 lb 12.5 oz (56.6 kg) 124 lb 5.4 oz (56.4 kg)   SpO2: 96% 99% 98% 98%   Weight change: 1 lb 8.7 oz (0.7 kg)  Intake/Output Summary (Last 24 hours) at 09/16/13 1214 Last data filed at 09/16/13 1100  Gross per 24 hour  Intake      0 ml  Output      0 ml  Net      0 ml   PEX General: alert, somewhat cooperative, and in no apparent distress HEENT: NCAT Neck: supple, no lymphadenopathy Lungs: clear to ascultation bilaterally, normal work of respiration, no wheezes, rales, ronchi Heart: regular rate and rhythm, no murmurs, gallops, or rubs Abdomen: soft, non-tender, non-distended, normal bowel sounds Extremities: 2+ DP/PT pulses bilaterally, no cyanosis, clubbing, or edema Neurologic: alert & oriented X3, cranial nerves II-XII intact, strength grossly intact, sensation intact to light touch  Lab Results: Basic Metabolic Panel:  Recent Labs Lab 09/14/13 0720 09/14/13 0739 09/15/13 0500  NA 146* 143 146*  146*  K 3.4* 3.3* 4.2  4.3  CL 89* 89* 101  101  CO2 39*  --  28  28  GLUCOSE 137* 137* 111*  113*  BUN 22 25* 33*  34*  CREATININE 7.43* 7.60* 8.88*  8.88*  CALCIUM 10.5  --  9.4  9.6  MG  --   --  2.0  2.0  PHOS  --   --  5.2*  5.3*   Liver Function Tests:  Recent Labs Lab 09/14/13 0720 09/15/13 0500  AST 23  --   ALT 18  --   ALKPHOS 164*  --   BILITOT 0.3   --   PROT 9.4*  --   ALBUMIN 4.8 3.5  3.5    Recent Labs Lab 09/14/13 0720  LIPASE 62*   CBC:  Recent Labs Lab 09/14/13 0720  09/15/13 0500 09/16/13 0509  WBC 9.3  --  3.3* 3.7*  NEUTROABS 6.9  --   --   --   HGB 13.3  < > 12.8* 9.0*  HCT 40.4  < > 40.2 27.1*  MCV 99.3  --  98.3 98.2  PLT 198  --  129* 149*  < > = values in this interval not displayed.  Cardiac Enzymes:  Recent Labs Lab 09/15/13 0500  TROPONINI <0.30   Coagulation:  Recent Labs Lab 09/14/13 0720  LABPROT 13.0  INR 1.00    Micro Results: Recent Results (from the past 240 hour(s))  MRSA PCR SCREENING     Status: None   Collection Time    09/14/13 12:08 PM      Result Value Range Status   MRSA by PCR NEGATIVE  NEGATIVE Final   Comment:  The GeneXpert MRSA Assay (FDA     approved for NASAL specimens     only), is one component of a     comprehensive MRSA colonization     surveillance program. It is not     intended to diagnose MRSA     infection nor to guide or     monitor treatment for     MRSA infections.   Studies/Results: US Abdomen Complete  09/14/2013   CLINICAL DATA:  Evaluate for cirrhosis. Elevated LFTs. History of end-stage renal disease.  EXAM: ULTRASOUND ABDOMEN COMPLETE  COMPARISON:  Abdominal CT 05/18/2008  FINDINGS: Gallbladder:  Echogenic structures within the gallbladder are consistent with stones. No gallbladder wall thickening. Patient does not have a sonographic Murphy's sign. Gallbladder is mildly distended.  Common bile duct:  Diameter: Measures 0.3 cm.  Liver:  Liver parenchyma is heterogeneous but no specific signs for cirrhosis. Main portal vein is patent.  IVC:  No abnormality visualized.  Pancreas:  Not visualized.  Spleen:  Spleen measures 7.9 cm in length without focal abnormality.  Right Kidney:  Length: 9.1 cm. Right kidney is diffusely heterogeneous and contains multiple cysts. Large calcification in the right kidney upper pole measures 1.2 cm. Right  kidney is echogenic and poorly characterized. Largest right kidney cyst measures up to 2.5 cm.  Left Kidney:  Length: 8.9 cm. Left kidney is very heterogeneous and there are multiple cystic and nodular structures throughout the kidney. Largest cystic structure measures up to 4.4 cm. Left kidney is very echogenic.  Abdominal aorta:  Mid and distal aorta are not visualized.  IMPRESSION: No gross abnormality to the liver. No evidence for biliary dilatation.  Cholelithiasis.  Both kidneys are very difficult to visualize and have cysts. Suspect that many of the cysts are complex and poorly characterized on this examination. Both kidneys are echogenic and consistent with chronic medical renal disease.  Large calcification in the right kidney upper pole.   Electronically Signed   By: Richarda Overlie M.D.   On: 09/14/2013 16:28   Medications: I have reviewed the patient's current medications. Scheduled Meds: . doxercalciferol  12 mcg Intravenous Q M,W,F-HD  . lidocaine-prilocaine   Topical Once  . phenytoin (DILANTIN) IV  200 mg Intravenous QHS  . sodium chloride  3 mL Intravenous Q12H   Continuous Infusions: . pantoprozole (PROTONIX) infusion 8 mg/hr (09/16/13 0600)   Assessment/Plan: # Suspected upper GI bleed- patient presented with hematemesis in early AM 11/26, source unclear.  Patient has history of esophageal varices and gastric ulcer seen on EGD in 2009.  EGD with GI (Dr. Randa Evens) yesterday which showed no clear source though coffee ground material in stomach which could be due to AVM; mild esophageal erosions and mild gastric erosions in antrum; no evidence of esophageal varices. Lactic acid 4.9 --> 2.0.  Abdominal ultrasound showed no evidence of cirrhosis. H/H 13-14 on admission, baseline Hgb 11-12.  Hgb 9.0 today, would like to repeat CBC to verify, but patient currently refusing; GI thinks may be dilutional.   -GI consult, appreciate recs -continue Protonix, tried to convert to 40 mg PO BID again this  morning but patient refusing all PO meds so will have to continue gtt -full liquid diet  -repeat CBC if patient or patient's sister [name]) will allow   # ESRD on HD TTS- Cr 8.88, Phos 5.3 on yesterday AM labs - renal consulted --> dialysis today prior to discharge if patient or sister will allow (patient may need sedation due to  agitation)  # Epilepsy- stable though dilantin level sub-therapeutic (4.9) - continue home dose Dilantin, tried to convert to PO this morning, but patient refusing all PO meds so will have to continue IV  # DVT PPX: SCDs in setting of hematemesis   Dispo:  Anticipated discharge today.   The patient does have a current PCP Linward Headland, MD) and does need an [name] hospital follow-up appointment after discharge.   .Services Needed at time of discharge: Y = Yes, Blank = No PT:   OT:   RN:   Equipment:   Other:     LOS: 2 days   Rocco Serene, MD 09/16/2013, 12:14 PM

## 2013-09-16 NOTE — Progress Notes (Addendum)
Hemodialysis-Family member at bedside. Pt cooperative and doing well. Unable to remove fluid d/t low bp. Order to keep even per K.Broadus John [place]. BP remains low at rinseback. 99/56. Transported back to unit in stable condition.

## 2013-09-16 NOTE — Procedures (Signed)
Doing quite well with dialysis via groin AVG cannulation which he initially refused today. Family present at bedside now. [name]    1. GI Bleed w/ mild erosions in antrum and esphagus 2. ESRD - TTS - off schedule 3. Hypertension/volume -  4. Anemia - ABLA 5. Metabolic bone disease  6. Sz d/o - getting IV dilantin  Subjective: Interval History: Hgb falling  Objective: Vital signs in last 24 hours: Temp:  [97.1 F (36.2 [name])-98.3 F (36.8 [name])] 97.6 F (36.4 [name]) (11/28 1300) Pulse Rate:  [87-96] 90 (11/28 1430) Resp:  [16-23] 22 (11/28 1411) BP: (105-123)/(53-71) 116/64 mmHg (11/28 1430) SpO2:  [96 %-99 %] 99 % (11/28 1411) Weight:  [56.4 kg (124 lb 5.4 oz)-56.6 kg (124 lb 12.5 oz)] 56.4 kg (124 lb 5.4 oz) (11/28 1411) Weight change: 0.7 kg (1 lb 8.7 oz)  Intake/Output from previous day: 11/27 0701 - 11/28 0700 In: 20 [I.V.:20] Out: -  Intake/Output this shift: Total I/O In: 0  Out: 2 [Urine:2]  General appearance: alert and cooperative Extremities: extremities normal, atraumatic, no cyanosis or edema  Lab Results:  Recent Labs  09/16/13 0509 09/16/13 1400  WBC 3.7* 4.5  HGB 9.0* 9.2*  HCT 27.1* 27.9*  PLT 149* 165   BMET:  Recent Labs  09/14/13 0720 09/14/13 0739 09/15/13 0500  NA 146* 143 146*  146*  K 3.4* 3.3* 4.2  4.3  CL 89* 89* 101  101  CO2 39*  --  28  28  GLUCOSE 137* 137* 111*  113*  BUN 22 25* 33*  34*  CREATININE 7.43* 7.60* 8.88*  8.88*  CALCIUM 10.5  --  9.4  9.6   No results found for this basename: PTH,  in the last 72 hours Iron Studies: No results found for this basename: IRON, TIBC, TRANSFERRIN, FERRITIN,  in the last 72 hours Studies/Results: US Abdomen Complete  09/14/2013   CLINICAL DATA:  Evaluate for cirrhosis. Elevated LFTs. History of end-stage renal disease.  EXAM: ULTRASOUND ABDOMEN COMPLETE  COMPARISON:  Abdominal CT 05/18/2008  FINDINGS: Gallbladder:  Echogenic structures within the gallbladder are  consistent with stones. No gallbladder wall thickening. Patient does not have a sonographic Murphy's sign. Gallbladder is mildly distended.  Common bile duct:  Diameter: Measures 0.3 cm.  Liver:  Liver parenchyma is heterogeneous but no specific signs for cirrhosis. Main portal vein is patent.  IVC:  No abnormality visualized.  Pancreas:  Not visualized.  Spleen:  Spleen measures 7.9 cm in length without focal abnormality.  Right Kidney:  Length: 9.1 cm. Right kidney is diffusely heterogeneous and contains multiple cysts. Large calcification in the right kidney upper pole measures 1.2 cm. Right kidney is echogenic and poorly characterized. Largest right kidney cyst measures up to 2.5 cm.  Left Kidney:  Length: 8.9 cm. Left kidney is very heterogeneous and there are multiple cystic and nodular structures throughout the kidney. Largest cystic structure measures up to 4.4 cm. Left kidney is very echogenic.  Abdominal aorta:  Mid and distal aorta are not visualized.  IMPRESSION: No gross abnormality to the liver. No evidence for biliary dilatation.  Cholelithiasis.  Both kidneys are very difficult to visualize and have cysts. Suspect that many of the cysts are complex and poorly characterized on this examination. Both kidneys are echogenic and consistent with chronic medical renal disease.  Large calcification in the right kidney upper pole.   Electronically Signed   By: Richarda Overlie M.D.   On: 09/14/2013 16:28  Scheduled: . doxercalciferol  12 mcg Intravenous Q M,W,F-HD  . lidocaine-prilocaine   Topical Once  . pantoprazole  40 mg Oral BID  . phenytoin (DILANTIN) IV  200 mg Intravenous QHS  . sodium chloride  3 mL Intravenous Q12H    LOS: 2 days   [name] 09/16/2013,2:50 PM

## 2013-09-16 NOTE — Progress Notes (Signed)
Pt alert in no acute distress, non-interactive has a hx of mental retardation. Situated in HD unit and attemptedx3 to start for HD tx procedures,but pt refused to be cannulated. Charge Nurse spoke and explained to pt the need for his HD tx this am,but pt still refused to start tx by covering the access site everytime an attempt to cannulate. Per charge nurse pt needs to go back to his unit. VSS(see flowsheets). Reports given to [facility] in 6N.

## 2013-09-16 NOTE — Progress Notes (Signed)
[facility] PROGRESS NOTE Subjective Pt refusing to eat or drink, refusing dialysis, and IV restart. Discussion with staff absolutely no sign of clinical bleeding  Objective: Vital signs in last 24 hours: Temp:  [97.1 F (36.2 C)-98.3 F (36.8 C)] 97.8 F (36.6 C) (11/28 0743) Pulse Rate:  [87-96] 90 (11/28 0743) Resp:  [16-20] 20 (11/28 0743) BP: (105-119)/(53-64) 111/64 mmHg (11/28 0743) SpO2:  [96 %-99 %] 98 % (11/28 0743) Weight:  [56.4 kg (124 lb 5.4 oz)-56.6 kg (124 lb 12.5 oz)] 56.4 kg (124 lb 5.4 oz) (11/28 0709) Last BM Date: 09/13/13  Intake/Output from previous day: 11/27 0701 - 11/28 0700 In: 20 [I.V.:20] Out: -  Intake/Output this shift:    PE: General--withdrawn, not responding but awake Heart-- Lungs--clear Abdomen--soft nontender  Lab Results:  Recent Labs  09/14/13 0720 09/14/13 0739 09/15/13 0500 09/16/13 0509  WBC 9.3  --  3.3* 3.7*  HGB 13.3 14.6 12.8* 9.0*  HCT 40.4 43.0 40.2 27.1*  PLT 198  --  129* 149*   BMET  Recent Labs  09/14/13 0720 09/14/13 0739 09/15/13 0500  NA 146* 143 146*  146*  K 3.4* 3.3* 4.2  4.3  CL 89* 89* 101  101  CO2 39*  --  28  28  CREATININE 7.43* 7.60* 8.88*  8.88*   LFT  Recent Labs  09/14/13 0720  PROT 9.4*  AST 23  ALT 18  ALKPHOS 164*  BILITOT 0.3   PT/INR  Recent Labs  09/14/13 0720  LABPROT 13.0  INR 1.00   PANCREAS  Recent Labs  09/14/13 0720  LIPASE 62*         Studies/Results: US Abdomen Complete  09/14/2013   CLINICAL DATA:  Evaluate for cirrhosis. Elevated LFTs. History of end-stage renal disease.  EXAM: ULTRASOUND ABDOMEN COMPLETE  COMPARISON:  Abdominal CT 05/18/2008  FINDINGS: Gallbladder:  Echogenic structures within the gallbladder are consistent with stones. No gallbladder wall thickening. Patient does not have a sonographic Murphy's sign. Gallbladder is mildly distended.  Common bile duct:  Diameter: Measures 0.3 cm.  Liver:  Liver parenchyma is  heterogeneous but no specific signs for cirrhosis. Main portal vein is patent.  IVC:  No abnormality visualized.  Pancreas:  Not visualized.  Spleen:  Spleen measures 7.9 cm in length without focal abnormality.  Right Kidney:  Length: 9.1 cm. Right kidney is diffusely heterogeneous and contains multiple cysts. Large calcification in the right kidney upper pole measures 1.2 cm. Right kidney is echogenic and poorly characterized. Largest right kidney cyst measures up to 2.5 cm.  Left Kidney:  Length: 8.9 cm. Left kidney is very heterogeneous and there are multiple cystic and nodular structures throughout the kidney. Largest cystic structure measures up to 4.4 cm. Left kidney is very echogenic.  Abdominal aorta:  Mid and distal aorta are not visualized.  IMPRESSION: No gross abnormality to the liver. No evidence for biliary dilatation.  Cholelithiasis.  Both kidneys are very difficult to visualize and have cysts. Suspect that many of the cysts are complex and poorly characterized on this examination. Both kidneys are echogenic and consistent with chronic medical renal disease.  Large calcification in the right kidney upper pole.   Electronically Signed   By: Richarda Overlie M.D.   On: 09/14/2013 16:28    Medications: I have reviewed the patient's current medications.  Assessment/Plan: 1. GI Bleed. Minimal findings on EGD, no gross bleeding to explain dec Hg refusing dialysis so may be some dilutional component. Would get  back on IV protonix asap as he allows IV. ? Bleeding scan , repeat EGD if Hg continues to drop.   Jina Olenick JR,Brailynn Breth L 09/16/2013, 11:02 AM

## 2013-09-16 NOTE — Progress Notes (Signed)
Patient has refused dialysis and lab work, MD notified.  Attempted to contact Carroll Sage, no answer, left message.  Contacted sister Wille Celeste who says patient does not like change and she will attempt to have someone come and talk to him.  Awaiting return phone call from sister.

## 2013-09-17 DIAGNOSIS — I959 Hypotension, unspecified: Secondary | ICD-10-CM

## 2013-09-17 MED ORDER — LORAZEPAM 2 MG/ML IJ SOLN
1.0000 mg | Freq: Once | INTRAMUSCULAR | Status: AC
  Administered 2013-09-17: 1 mg via INTRAMUSCULAR
  Filled 2013-09-17: qty 1

## 2013-09-17 MED ORDER — WHITE PETROLATUM GEL
Status: AC
  Filled 2013-09-17: qty 5

## 2013-09-17 MED ORDER — RENA-VITE PO TABS
1.0000 | ORAL_TABLET | Freq: Every day | ORAL | Status: DC
  Filled 2013-09-17: qty 1

## 2013-09-17 MED ORDER — PANTOPRAZOLE SODIUM 40 MG PO TBEC: 40.0000 mg | DELAYED_RELEASE_TABLET | Freq: Two times a day (BID) | ORAL | Status: DC

## 2013-09-17 MED ORDER — LORAZEPAM 2 MG/ML IJ SOLN: 1.0000 mg | Freq: Once | INTRAMUSCULAR | Status: DC

## 2013-09-17 MED ORDER — MIDODRINE HCL 5 MG PO TABS
15.0000 mg | ORAL_TABLET | Freq: Three times a day (TID) | ORAL | Status: DC
  Administered 2013-09-17: 15 mg via ORAL
  Filled 2013-09-17 (×3): qty 3

## 2013-09-17 NOTE — Progress Notes (Signed)
Subjective: Pt asleep in bed, nad.   Objective: Vital signs in last 24 hours: Filed Vitals:   09/16/13 1811 09/16/13 2057 09/17/13 0500 09/17/13 0530  BP: 98/56 95/54 98/56    Pulse: 104 105 96   Temp: 97.6 F (36.4 C) 98.1 F (36.7 C) 98.1 F (36.7 C)   TempSrc:  Oral Oral   Resp: 16 20 20    Height:      Weight: 121 lb 11.1 oz (55.2 kg)   123 lb 14.4 oz (56.2 kg)  SpO2: 100% 97% 100%    Weight change: -7.1 oz (-0.2 kg)  Intake/Output Summary (Last 24 hours) at 09/17/13 0904 Last data filed at 09/17/13 0600  Gross per 24 hour  Intake     15 ml  Output    240 ml  Net   -225 ml   Vitals reviewed. General: resting in bed, NAD HEENT: /at Cardiac: RRR, no rubs, murmurs or gallops Pulm: clear to auscultation bilaterally Abd: soft, nontender, nondistended, BS present Ext: warm and well perfused, no pedal edema Neuro: alert and oriented X3, cranial nerves II-XII grossly intact  Lab Results: Basic Metabolic Panel:  Recent Labs Lab 09/14/13 0720 09/14/13 0739 09/15/13 0500  NA 146* 143 146*  146*  K 3.4* 3.3* 4.2  4.3  CL 89* 89* 101  101  CO2 39*  --  28  28  GLUCOSE 137* 137* 111*  113*  BUN 22 25* 33*  34*  CREATININE 7.43* 7.60* 8.88*  8.88*  CALCIUM 10.5  --  9.4  9.6  MG  --   --  2.0  2.0  PHOS  --   --  5.2*  5.3*   Liver Function Tests:  Recent Labs Lab 09/14/13 0720 09/15/13 0500  AST 23  --   ALT 18  --   ALKPHOS 164*  --   BILITOT 0.3  --   PROT 9.4*  --   ALBUMIN 4.8 3.5  3.5    Recent Labs Lab 09/14/13 0720  LIPASE 62*   CBC:  Recent Labs Lab 09/14/13 0720  09/16/13 0509 09/16/13 1400  WBC 9.3  < > 3.7* 4.5  NEUTROABS 6.9  --   --   --   HGB 13.3  < > 9.0* 9.2*  HCT 40.4  < > 27.1* 27.9*  MCV 99.3  < > 98.2 97.2  PLT 198  < > 149* 165  < > = values in this interval not displayed. Cardiac Enzymes:  Recent Labs Lab 09/15/13 0500  TROPONINI <0.30   Coagulation:  Recent Labs Lab 09/14/13 0720  LABPROT  13.0  INR 1.00   Misc. Labs: none  Micro Results: Recent Results (from the past 240 hour(s))  MRSA PCR SCREENING     Status: None   Collection Time    09/14/13 12:08 PM      Result Value Range Status   MRSA by PCR NEGATIVE  NEGATIVE Final   Comment:            The GeneXpert MRSA Assay (FDA     approved for NASAL specimens     only), is one component of a     comprehensive MRSA colonization     surveillance program. It is not     intended to diagnose MRSA     infection nor to guide or     monitor treatment for     MRSA infections.   Studies/Results: No results found. Medications: Scheduled Meds: . doxercalciferol  12 mcg Intravenous Q M,W,F-HD  . multivitamin  1 tablet Oral QHS  . pantoprazole  40 mg Oral BID  . phenytoin  200 mg Oral QHS  . sodium chloride  3 mL Intravenous Q12H   Continuous Infusions:  PRN Meds:.ondansetron Assessment/Plan: 62 y.o mental retardation, mute, seizures who presented to [facility] with hematemesis.    #Hematemesis with suspected GIB (gastrointestinal bleeding) -This has happed to patient in the past with no obvious source.   -Upper endoscopy with no active source of bleeding. Esophageal erosions and Gastric erosion (at antrum) -He was on a Protonix gtt this admission and home dose of Protonix will be 40 mg bid.  -[facility] following patient and okay with discharge.  Unsure if will do bleeding scan repeat EGD in the future  -trend CBC-pt refusing labs today but will get in HD   #HYPOTENSION -Patient has h/o HTN and hypotension.  He was not able to tolerate HD yesterday due to hypotensive episode though when I spoke to renal today they state pt ran full HD yesterday but will give him HD today due to him missing days of dialysis    -He will get HD today for 2.5 hours  -Monitor BP. This am was 98/56.   -Renal resumed home dose of Midodrine   #End stage renal disease on HD TThSat -HD today for 2.5 hours then may discharge after HD on regular HD  schedule  #Agitation -will give Ativan 1 mg im x 1.  If does not work will repeat    #F/E/N -Pending renal function panel. Pt refusing labs will get labs in HD  -full liq diet ok to transition if pt will eat anything normally does not eat in the hospital   #DVT px  -scds    Dispo: D/c home today with [facility] f/u and f/u with [facility]   The patient does have a current PCP Linward Headland, MD) and does need an [facility] hospital follow-up appointment after discharge.  The patient does not have transportation limitations that hinder transportation to clinic appointments.  .Services Needed at time of discharge: Y = Yes, Blank = No PT:   OT:   RN:   Equipment:   Other:     LOS: 3 days   Annett Gula, MD [phone] 09/17/2013, 9:04 AM

## 2013-09-17 NOTE — Progress Notes (Signed)
Patient back from dialysis, refusing to take his medication from nurse or nephew.  Patient also refusing to eat or drink anything.  Rn attempted to flush saline lock that was in patients right [facility] however site was leaking so RN had to remove.  RN had to have considerable help from nephew to accomplish this as patient was swinging his arms and attempting to pull away so nurse could not touch him.  Did retrieve supplies and attempt to place tourniquet on arm to assess for IV access however patient hitting and scratching nurse, pulling away and being very physically aggressive to the point where there was no chance of obtaining access.  Spoke with oncoming nurse regarding this who will discuss with physician when she makes rounds.

## 2013-09-17 NOTE — Progress Notes (Signed)
[name] 10:08 AM  Subjective: Patient without any obvious complaints and no signs of bleeding but refusing pills to eat etc. labs drawn and his case was discussed both with my partner Dr. Randa Evens in the hospital team Objective: Vital signs stable afebrile no new labs patient not letting me to examine him but denies abdominal pain  Assessment: Multiple medical problems including multiple endoscopies for coffee-ground emesis multiple times in the past  Plan: Continue pump inhibitor call me when necessary explained to family risks of sending him home and hopefully he will function better there but should have close followup at dialysis and try to limit aspirin and nonsteroidals and blood thinners as much is possible  [name] E

## 2013-09-17 NOTE — Progress Notes (Signed)
He was brought in for his dialysis treatment to keep him on schedule as plans are for discharge.  He is refusing his treatment despite family present. Will see pateitn at the kidney center as he had a dialysis session yesterday. [name] C

## 2013-09-17 NOTE — Progress Notes (Signed)
Patient refusing PO Dilantin after multiple attempts.  Also refusing IV restart for RN.  IV team called to attempt IV restart and patient became combative.  No IV access. Teaching service made aware and requested to try PO again later.  Will do so and will continue to monitor.

## 2013-09-17 NOTE — Discharge Summary (Signed)
  Date: 09/17/2013  Patient name: [name]  Medical record number: [id number]  Date of birth: 1951-05-14   This patient has been seen and the plan of care was discussed with the house staff. Please see their note for complete details. I concur with their findings and plan.  Jonah Blue, DO, FACP Faculty [facility] Internal Medicine Residency Program 09/17/2013, 7:57 PM

## 2013-09-17 NOTE — Discharge Summary (Signed)
Name: [name] MRN: [id number] DOB: [birth year] 62 y.o. PCP: Linward Headland, MD  Date of Admission: 09/14/2013  6:45 AM Date of Discharge: 09/17/2013 Attending Physician: Jonah Blue, DO  Discharge Diagnosis: 1. Hematemesis with suspected upper GIB (gastrointestinal bleeding)  2. Hypotension 3. End stage renal disease on HD TThSat  4. Seizure disorder 5. Resolved lactic acidosis   Discharge Medications:   Medication List         acetaminophen 325 MG tablet  Commonly known as:  TYLENOL  Take 2 tablets (650 mg total) by mouth every 6 (six) hours as needed for moderate pain.     atorvastatin 20 MG tablet  Commonly known as:  LIPITOR  Take 1 tablet (20 mg total) by mouth daily.     clonazePAM 0.5 MG tablet  Commonly known as:  KLONOPIN  Take 0.5 tablets (0.25 mg total) by mouth 2 (two) times daily.     folic acid-vitamin b complex-vitamin c-selenium-zinc 3 MG Tabs tablet  Take 1 tablet by mouth daily.     midodrine 10 MG tablet  Commonly known as:  PROAMATINE  Take 15 mg by mouth 3 (three) times daily.     pantoprazole 40 MG tablet  Commonly known as:  PROTONIX  Take 1 tablet (40 mg total) by mouth 2 (two) times daily.     phenytoin 100 MG ER capsule  Commonly known as:  DILANTIN  Take 2 capsules (200 mg total) by mouth at bedtime.     sevelamer carbonate 800 MG tablet  Commonly known as:  RENVELA  Take 800 mg by mouth 3 (three) times daily with meals.        Disposition and follow-up:   [name] was discharged from [facility] in stable condition.  At the hospital follow up visit please address:  1.  Compliance with medications, make sure patient has follow up with [facility] gastroenterology  2.  Labs / imaging needed at time of follow-up: CBC  3.  Pending labs/ test needing follow-up:none   Follow-up Appointments:   Discharge Instructions: Discharge Orders   Future Appointments Provider Department Dept Phone   11/18/2013  10:00 AM Nilda Riggs, NP [facility] [phone]   Future Orders Complete By Expires   Diet - low sodium heart healthy  As directed    Discharge instructions  As directed    Comments:     1) Please take Protonix 2x per day  2) Please follow up in the Internal Medicine clinic [phone] 3) Call and schedule follow up with [facility] [phone] 4) Avoid Aspirin, Ibuprofen containing medications   Increase activity slowly  As directed       Consultations: Treatment Team:  Vertell Novak., MD Lauris Poag, MD  Procedures Performed:  Dg Chest 2 View  08/29/2013   CLINICAL DATA:  62-year- male with right chest pain status post fall. Initial encounter.  EXAM: CHEST  2 VIEW  COMPARISON:  Portable chest radiographs 02/18/2013 and earlier. Two-view chest 06/14/2010.  FINDINGS: Upright AP and lateral views today. Previously-seen inferior approach dual lumen dialysis type catheter no longer visible. Chronically low lung volumes. No pneumothorax, pulmonary edema, pleural effusion or consolidation. Normal cardiac size and mediastinal contours. Visualized tracheal air column is within normal limits. Chronic scoliosis. No displaced rib fracture or acute osseous abnormality identified.  IMPRESSION: Chronic low lung volumes. No acute cardiopulmonary abnormality.   Electronically Signed   By: Si Gaul.D.  On: 08/29/2013 11:16   US Abdomen Complete  09/14/2013   CLINICAL DATA:  Evaluate for cirrhosis. Elevated LFTs. History of end-stage renal disease.  EXAM: ULTRASOUND ABDOMEN COMPLETE  COMPARISON:  Abdominal CT 05/18/2008  FINDINGS: Gallbladder:  Echogenic structures within the gallbladder are consistent with stones. No gallbladder wall thickening. Patient does not have a sonographic Murphy's sign. Gallbladder is mildly distended.  Common bile duct:  Diameter: Measures 0.3 cm.  Liver:  Liver parenchyma is heterogeneous but no specific signs for cirrhosis. Main  portal vein is patent.  IVC:  No abnormality visualized.  Pancreas:  Not visualized.  Spleen:  Spleen measures 7.9 cm in length without focal abnormality.  Right Kidney:  Length: 9.1 cm. Right kidney is diffusely heterogeneous and contains multiple cysts. Large calcification in the right kidney upper pole measures 1.2 cm. Right kidney is echogenic and poorly characterized. Largest right kidney cyst measures up to 2.5 cm.  Left Kidney:  Length: 8.9 cm. Left kidney is very heterogeneous and there are multiple cystic and nodular structures throughout the kidney. Largest cystic structure measures up to 4.4 cm. Left kidney is very echogenic.  Abdominal aorta:  Mid and distal aorta are not visualized.  IMPRESSION: No gross abnormality to the liver. No evidence for biliary dilatation.  Cholelithiasis.  Both kidneys are very difficult to visualize and have cysts. Suspect that many of the cysts are complex and poorly characterized on this examination. Both kidneys are echogenic and consistent with chronic medical renal disease.  Large calcification in the right kidney upper pole.   Electronically Signed   By: Richarda Overlie M.D.   On: 09/14/2013 16:28   Dg Chest Port 1 View  09/14/2013   CLINICAL DATA:  Altered mental status.  EXAM: PORTABLE CHEST - 1 VIEW  COMPARISON:  08/29/2013  FINDINGS: Heart is normal size. No confluent airspace opacities. Low lung volumes. No effusions or acute bony abnormality.  IMPRESSION: No active disease.   Electronically Signed   By: Charlett Nose M.D.   On: 09/14/2013 10:52    Admission HPI:  Chief Complaint: Hematemesis  History of Present Illness:  Patient is a 62 year old male with a PMH of ESRD on HD TTS, portal hypertension and esophageal varices and prior gastric ulcer, superior vena cava syndrome, mental retardation and mutism, and epilepsy who presents emergency department via EMS with hematemesis. Patient is mute but is able to answer yes or no questions. The history is obtained  by chart review and discussion with patient and his sisters  Pt was in his usual state of health until 2 am this morning when he was noted to have gradual onset of nausea and vomiting, and his caregiver noted bright red bloody emesis(amount and frequency unknown). Denies fever, chills, headache, sore throat, chest pain, chest pressure, palpitation, abdominal pain, dizziness, lightheadedness, LOC or weakness. Patient was brought to the [facility] ED for evaluation and was noted to have tachycardia, hypotension of BP 79/40 and elevated lactic acid of 4.91. He was started on IVF resuscitation and received 2 liters NS bolus. The IMTS is called for admission.  Of note, he was hospitalized for similar problems in April 2014. No significant source of bleeding noted on EGD at the time. He had lactic acidosis of 9.09 and ? Aspiration PNA, which was treated with Abx. Notably, he had PEA arrest during EGD in 2012  Review of Systems:  Review of Systems:  Constitutional:  Denies fever, chills, diaphoresis.   HEENT:  Denies congestion, sore throat,  rhinorrhea, sneezing, mouth sores, trouble swallowing, neck pain   Respiratory:  Denies SOB, DOE, cough, and wheezing.   Cardiovascular:  Denies palpitations and leg swelling.   Gastrointestinal:  positive nausea, vomiting, bloody emesis. Denies abdominal pain, diarrhea, constipation, blood in stool and abdominal distention.   Genitourinary:  Denies dysuria, urgency, frequency, hematuria, flank pain and difficulty urinating.   Musculoskeletal:  Denies myalgias, back pain, joint swelling, arthralgias and gait problem.   Skin:  Denies pallor, rash and wound.   Neurological:  Denies dizziness, seizures, syncope, weakness, light-headedness, numbness and headaches.    Physical Exam:  Blood pressure 94/61, pulse 98, temperature 98 F (36.7 C), temperature source Oral, resp. rate 14, height 5\' 5"  (1.651 m), weight 123 lb 3.8 oz (55.9 kg), SpO2 100.00%.  General: alert. Acute  sickness  Head: normocephalic and atraumatic.  Eyes: vision grossly intact, pupils equal, pupils round, pupils reactive to light, no injection and anicteric.  Mouth: pharynx pink and moist, no erythema, and no exudates.  Neck: supple, full ROM, no thyromegaly, no JVD, and no carotid bruits.  Lungs: normal respiratory effort, no accessory muscle use, normal breath sounds, no crackles, and no wheezes. Heart: normal rate, regular rhythm, no murmur, no gallop, and no rub.  Abdomen: soft, non-tender, normal bowel sounds, no distention, no guarding, no rebound tenderness, no hepatomegaly, and no splenomegaly.  Msk: no joint swelling, no joint warmth, and no redness over joints.  Pulses: 2+ DP/PT pulses bilaterally Extremities: No cyanosis, clubbing, edema Neurologic: alert & oriented X3, cranial nerves II-XII intact, strength normal in all extremities, sensation intact to light touch, and gait normal.  Skin: turgor normal and no rashes.  Psych: Oriented X3, memory intact for recent and remote, normally interactive, good eye contact, not anxious appearing, and not depressed appearing.      Hospital Course by problem list: 1. Hematemesis with suspected upper GIB (gastrointestinal bleeding)  2. Hypotension 3. End stage renal disease on HD TThSat  4. Seizure disorder 5. Resolved lactic acidosis   62 y.o mental retardation, mute, seizures who presented to [facility] with hematemesis.   1. Hematemesis with suspected upper GIB (gastrointestinal bleeding)  This has happed three times to patient in the past with no obvious source. He follows with [facility].  They even considered a nosebleed as cause.  He had an endoscopy this admission with no active source of bleeding but he had esophageal erosions and gastric erosion (at antrum).  He was on an Octreotide and Protonix drip and transitioned to Protonix 40 mg bid.  Per [facility] chart review he carries a history of cirrhosis and varices according to  his all records, but liver tests have been a normal and US abdomen this admission did not indicate cirrhosis. 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 He has had G.I. bleeding 2 years previously with blood in the stomach with no active bleeding source found. He also has apparently has had H. pylori in the past in biopsies and has had a previous history of gastric ulcers.  Patient was not bleeding prior to discharge but [facility] considered bleeding scan,  repeat EGD in the future if bleeding occurs again.  We trended his hemoglobin this admission last noted to be 9.2 with pending labs and on admission hemoglobin was 12-13.    2. Hypotension Patient has history of hypertension and hypotension. He was hypotensive episode this admission and his home Midodrine was resumed.  3. End stage renal disease on HD TThSat  Renal was consulted and the patient had dialysis this admission.  He got 2.5 hours of dialysis prior to discharge then went home to follow up on his regular HD schedule   4. Seizure disorder Patient was given Dilantin iv 200 mg qhs.   5. Resolved lactic acidosis  Etiology unclear etiology his blood pressure has been as low as 70s/40s this admission given 2 L of fluids. No cultures were performed.  Lactic acid was 4.91 on admission trended down to 2.0    He was given compression devices for DVT prophylaxis this admission   Discharge Vitals:   BP 98/56  Pulse 96  Temp(Src) 98.1 F (36.7 C) (Oral)  Resp 20  Ht 5\' 5"  (1.651 m)  Wt 123 lb 14.4 oz (56.2 kg)  BMI 20.62 kg/m2  SpO2 100%  Discharge physical exam:  General: resting in bed, NAD, asleep HEENT: [place]/at  Cardiac: RRR, no rubs, murmurs or gallops  Pulm: clear to auscultation bilaterally  Abd: soft, nontender, nondistended, BS present  Ext: warm and well perfused, no pedal edema  Neuro: alert and oriented X3, cranial nerves II-XII grossly  intact  Discharge Labs:  Results for RANSON, BELLUOMINI (MRN [id number]) as of 09/17/2013 12:59  Ref. Range 09/14/2013 07:20 09/14/2013 07:39 09/15/2013 05:00 09/15/2013 05:00  Sodium Latest Range: 135-145 mEq/L 146 (H) 143 146 (H) 146 (H)  Potassium Latest Range: 3.5-5.1 mEq/L 3.4 (L) 3.3 (L) 4.3 4.2  Chloride Latest Range: 96-112 mEq/L 89 (L) 89 (L) 101 101  CO2 Latest Range: 19-32 mEq/L 39 (H)  28 28  BUN Latest Range: 6-23 mg/dL 22 25 (H) 34 (H) 33 (H)  Creatinine Latest Range: 0.50-1.35 mg/dL 8.46 (H) 9.62 (H) 9.52 (H) 8.88 (H)  Calcium Latest Range: 8.4-10.5 mg/dL 84.1  9.6 9.4  GFR calc non Af Amer Latest Range: >90 mL/min 7 (L)  6 (L) 6 (L)  GFR calc Af Amer Latest Range: >90 mL/min 8 (L)  6 (L) 6 (L)  Glucose Latest Range: 70-99 mg/dL 324 (H) 401 (H) 027 (H) 111 (H)  Calcium Ionized Latest Range: 1.13-1.30 mmol/L  1.02 (L)    Phosphorus Latest Range: 2.3-4.6 mg/dL   5.3 (H) 5.2 (H)  Magnesium Latest Range: 1.5-2.5 mg/dL   2.0 2.0  Alkaline Phosphatase Latest Range: 39-117 U/L 164 (H)     Albumin Latest Range: 3.5-5.2 g/dL 4.8  3.5 3.5  Lipase Latest Range: 11-59 U/L 62 (H)     AST Latest Range: 0-37 U/L 23     ALT Latest Range: 0-53 U/L 18     Total Protein Latest Range: 6.0-8.3 g/dL 9.4 (H)     Total Bilirubin Latest Range: 0.3-1.2 mg/dL 0.3     Troponin I Latest Range: <0.30 ng/mL   <0.30   Lactic Acid, Venous Latest Range: 0.5-2.2 mmol/L  4.91 (H) 2.0    Results for TERALD, JUMP (MRN [id number]) as of 09/17/2013 12:59  Ref. Range 09/14/2013 07:20 09/14/2013 07:39 09/15/2013 05:00 09/16/2013 05:09 09/16/2013 14:00  WBC Latest Range: 4.0-10.5 K/uL 9.3  3.3 (L) 3.7 (L) 4.5  RBC Latest Range: 4.22-5.81 MIL/uL 4.07 (L)  4.09 (L) 2.76 (L) 2.87 (L)  Hemoglobin Latest Range: 13.0-17.0 g/dL 47.4 25.9 56.3 (L) 9.0 (L) 9.2 (L)  HCT Latest Range: 39.0-52.0 % 40.4 43.0 40.2 27.1 (L) 27.9 (L)  MCV Latest Range: 78.0-100.0 fL 99.3  98.3 98.2 97.2  MCH Latest Range: 26.0-34.0 pg 32.7  31.3  32.6 32.1  MCHC Latest Range: 30.0-36.0 g/dL 78.2  95.6 21.3 08.6  RDW Latest Range: 11.5-15.5 % 14.6  14.6 14.4 14.0  Platelets Latest Range: 150-400 K/uL 198  129 (L) 149 (L) 165  Neutrophils Relative % Latest Range: 43-77 % 75      Lymphocytes Relative Latest Range: 12-46 % 16      Monocytes Relative Latest Range: 3-12 % 9      Eosinophils Relative Latest Range: 0-5 % 0      Basophils Relative Latest Range: 0-1 % 0      NEUT# Latest Range: 1.7-7.7 K/uL 6.9      Lymphocytes Absolute Latest Range: 0.7-4.0 K/uL 1.5      Monocytes Absolute Latest Range: 0.1-1.0 K/uL 0.9      Eosinophils Absolute Latest Range: 0.0-0.7 K/uL 0.0      Basophils Absolute Latest Range: 0.0-0.1 K/uL 0.0       Results for VALEN, GILLISON (MRN [id number]) as of 09/17/2013 12:59  Ref. Range 09/14/2013 07:20  Prothrombin Time Latest Range: 11.6-15.2 seconds 13.0  INR Latest Range: 0.00-1.49  1.00   Results for NORRIS, BRUMBACH (MRN [id number]) as of 09/17/2013 12:59  Ref. Range 09/15/2013 05:00  Phenytoin Lvl Latest Range: 10.0-20.0 ug/mL 4.9 (L)   Results for KAYMON, DENOMME (MRN [id number]) as of 09/17/2013 12:59  Ref. Range 09/15/2013 05:00  Cortisol - AM Latest Range: 4.3-22.4 ug/dL 9.7   Results for CHEVON, LAUFER (MRN [id number]) as of 09/17/2013 12:59  Ref. Range 09/14/2013 07:42  Fecal Occult Blood, POC Latest Range: NEGATIVE  POSITIVE (A)   Results for orders placed during the hospital encounter of 09/14/13 (from the past 24 hour(s))  CBC     Status: Abnormal   Collection Time    09/16/13  2:00 PM      Result Value Range   WBC 4.5  4.0 - 10.5 K/uL   RBC 2.87 (*) 4.22 - 5.81 MIL/uL   Hemoglobin 9.2 (*) 13.0 - 17.0 g/dL   HCT 59.5 (*) 63.8 - 75.6 %   MCV 97.2  78.0 - 100.0 fL   MCH 32.1  26.0 - 34.0 pg   MCHC 33.0  30.0 - 36.0 g/dL   RDW 43.3  29.5 - 18.8 %   Platelets 165  150 - 400 K/uL    Signed: Annett Gula, MD 09/17/2013, 1:12 PM   Time Spent on Discharge: >30 minutes Services Ordered  on Discharge: none Equipment Ordered on Discharge: none

## 2013-09-17 NOTE — Progress Notes (Signed)
  Date: 09/17/2013  Patient name: [name]  Medical record number: [id number]  Date of birth: 05-15-1951   This patient has been seen and the plan of care was discussed with the house staff. Please see their note for complete details. I concur with their findings with the following additions/corrections:  There is no evidence of bleeding. He needs to take his PPI, perhaps his family member can help. Needs HD today.   Jonah Blue, DO, FACP Faculty [facility] Residency Program 09/17/2013, 12:45 PM

## 2013-09-17 NOTE — Progress Notes (Signed)
Patient sleeping soundly this morning, nurse woke patient up to ask if he wanted anything to eat at which point patient turned his head away.  When nurse attempted to move tray closer to patient to show him what was on the tray patient took his hand and pushed tray away.  Patient refusing to eat or drink anything.

## 2013-09-17 NOTE — Progress Notes (Signed)
Patient has eaten and drank for sisters as well as taken his protonix and Midodrine.  Discharge instructions gone over with sisters, questions answered, verbalized understanding.  Patient dressed by RN and gotten up to wheelchair, transported by RN to front of hospital to be taken home by sisters.  Patient in good condition upon discharge from [facility].

## 2013-09-17 NOTE — Progress Notes (Signed)
Spoke with Campbell Lerner she will be in about 11 AM this morning.   Shirlee Latch MD

## 2013-09-17 NOTE — Progress Notes (Signed)
[facility] Progress Note  Subjective:   sleeping  Objective Filed Vitals:   09/16/13 1811 09/16/13 2057 09/17/13 0500 09/17/13 0530  BP: 98/56 95/54 98/56    Pulse: 104 105 96   Temp: 97.6 F (36.4 C) 98.1 F (36.7 C) 98.1 F (36.7 C)   TempSrc:  Oral Oral   Resp: 16 20 20    Height:      Weight: 55.2 kg (121 lb 11.1 oz)   56.2 kg (123 lb 14.4 oz)  SpO2: 100% 97% 100%    Physical Exam sleeping - did not rouse (s/p ativan) General: breathing easily supine without O2 Heart: RRR Lungs: no wheezes or rales Abdomen: soft Extremities: no LE edmema Dialysis Access: left thigh graft + bruit and thrill Dialysis Orders: Center: [facility] TTS 3/75 hour Optiflux 160 100/ A 1.5 left thigh graft EDW 55.5 (decreased from 56.5 11/20, Epo 1000 hectorol 12 ven 50/week heaprin 2800 2 K 2 Ca bath  Recent labs: Hgb 11.3 11/20 with tsat 34% ferritin 547 10/23 iPTH 617 Oct = usual range  Assessment/Plan: 1. Hematemesis with mild erosions in antrum and esphagus - no heparin HD now and at d/c; refusing po PPI; no more vomiting - believe he would do better taking meds at home in an environment he is familiar with with familiar caregivers. 2. ESRD - TTS - off schedule due to holiday - had HD Friday ran 4 hours with min UF due to BP drop - plan 2.5 hour today to get back on schedule and then d/c home. Will wait until caregiver is here to assist with communication with pt.  Labs can be drawn in HD 3. BP/volume - stop IVFs; family needs to assist with nutrition(he takes nothing from the staff) on midodrine 15 tid  - will start 4. Anemia - Hgb 12.8 >9s - resume ESA; repeat CBC on HD to determine trend; ok for d/c if stable 5. Metabolic bone disease -continue  hectoral 6. Sz d/o - getting IV dilantin; has refused po dilantin 7. MR/agitation - refusing meds; given IM ativan - per admission med list it says he is on klonopin 0.5 bid - need to verify; sedated now;  8. Disp - anticipate d/c post  HD  Sheffield Slider, PA-C [facility] Beeper [phone] 09/17/2013,8:21 AM  LOS: 3 days  Renal Atending Agree with note articulated by Ms Milana Obey C   Additional Objective Labs: Basic Metabolic Panel:  Recent Labs Lab 09/14/13 0720 09/14/13 0739 09/15/13 0500  NA 146* 143 146*  146*  K 3.4* 3.3* 4.2  4.3  CL 89* 89* 101  101  CO2 39*  --  28  28  GLUCOSE 137* 137* 111*  113*  BUN 22 25* 33*  34*  CREATININE 7.43* 7.60* 8.88*  8.88*  CALCIUM 10.5  --  9.4  9.6  PHOS  --   --  5.2*  5.3*   Liver Function Tests:  Recent Labs Lab 09/14/13 0720 09/15/13 0500  AST 23  --   ALT 18  --   ALKPHOS 164*  --   BILITOT 0.3  --   PROT 9.4*  --   ALBUMIN 4.8 3.5  3.5    Recent Labs Lab 09/14/13 0720  LIPASE 62*   CBC:  Recent Labs Lab 09/14/13 0720  09/15/13 0500 09/16/13 0509 09/16/13 1400  WBC 9.3  --  3.3* 3.7* 4.5  NEUTROABS 6.9  --   --   --   --  HGB 13.3  < > 12.8* 9.0* 9.2*  HCT 40.4  < > 40.2 27.1* 27.9*  MCV 99.3  --  98.3 98.2 97.2  PLT 198  --  129* 149* 165  < > = values in this interval not displayed. Blood Culture    Component Value Date/Time   SDES BLOOD LEFT HAND 02/13/2013 0819   SPECREQUEST BOTTLES DRAWN AEROBIC ONLY 3CC 02/13/2013 0819   CULT  Value: STAPHYLOCOCCUS SPECIES (COAGULASE NEGATIVE) Note: THE SIGNIFICANCE OF ISOLATING THIS ORGANISM FROM A SINGLE SET OF BLOOD CULTURES WHEN MULTIPLE SETS ARE DRAWN IS UNCERTAIN. PLEASE NOTIFY THE MICROBIOLOGY DEPARTMENT WITHIN ONE WEEK IF SPECIATION AND SENSITIVITIES ARE REQUIRED. Note: Gram Stain Report Called to,Read Back By and Verified With: JOY HOLLAND ON 02/15/2013 AT 2:46A BY WILEJ 02/13/2013 0819   REPTSTATUS 02/17/2013 FINAL 02/13/2013 0819    Cardiac Enzymes:  Recent Labs Lab 09/15/13 0500  TROPONINI <0.30  Medications:   . doxercalciferol  12 mcg Intravenous Q M,W,F-HD  . pantoprazole  40 mg Oral BID  . phenytoin  200 mg Oral QHS  . sodium  chloride  3 mL Intravenous Q12H

## 2013-09-19 ENCOUNTER — Encounter (HOSPITAL_COMMUNITY): Payer: Self-pay | Admitting: Gastroenterology

## 2013-10-02 ENCOUNTER — Inpatient Hospital Stay (HOSPITAL_COMMUNITY)
Admission: EM | Admit: 2013-10-02 | Discharge: 2013-10-08 | DRG: 377 | Disposition: A | Discharge status: Home / Self Care | Payer: Medicare Other | Attending: Internal Medicine | Admitting: Internal Medicine

## 2013-10-02 ENCOUNTER — Encounter (HOSPITAL_COMMUNITY)
Admission: EM | Disposition: A | Discharge status: Home / Self Care | Payer: Self-pay | Source: Home / Self Care | Attending: Internal Medicine

## 2013-10-02 ENCOUNTER — Encounter (HOSPITAL_COMMUNITY): Payer: Self-pay | Admitting: *Deleted

## 2013-10-02 ENCOUNTER — Emergency Department (HOSPITAL_COMMUNITY): Payer: Medicare Other

## 2013-10-02 DIAGNOSIS — R4701 Aphasia: Secondary | ICD-10-CM | POA: Diagnosis present

## 2013-10-02 DIAGNOSIS — Z79899 Other long term (current) drug therapy: Secondary | ICD-10-CM

## 2013-10-02 DIAGNOSIS — K766 Portal hypertension: Secondary | ICD-10-CM | POA: Diagnosis present

## 2013-10-02 DIAGNOSIS — E872 Acidosis, unspecified: Secondary | ICD-10-CM | POA: Diagnosis present

## 2013-10-02 DIAGNOSIS — K921 Melena: Secondary | ICD-10-CM | POA: Diagnosis present

## 2013-10-02 DIAGNOSIS — F411 Generalized anxiety disorder: Secondary | ICD-10-CM | POA: Diagnosis present

## 2013-10-02 DIAGNOSIS — I959 Hypotension, unspecified: Secondary | ICD-10-CM

## 2013-10-02 DIAGNOSIS — I871 Compression of vein: Secondary | ICD-10-CM | POA: Diagnosis present

## 2013-10-02 DIAGNOSIS — I9589 Other hypotension: Secondary | ICD-10-CM | POA: Diagnosis present

## 2013-10-02 DIAGNOSIS — N2581 Secondary hyperparathyroidism of renal origin: Secondary | ICD-10-CM | POA: Diagnosis present

## 2013-10-02 DIAGNOSIS — D500 Iron deficiency anemia secondary to blood loss (chronic): Secondary | ICD-10-CM | POA: Diagnosis present

## 2013-10-02 DIAGNOSIS — I739 Peripheral vascular disease, unspecified: Secondary | ICD-10-CM | POA: Diagnosis present

## 2013-10-02 DIAGNOSIS — K7460 Unspecified cirrhosis of liver: Secondary | ICD-10-CM | POA: Diagnosis present

## 2013-10-02 DIAGNOSIS — K228 Other specified diseases of esophagus: Secondary | ICD-10-CM | POA: Diagnosis present

## 2013-10-02 DIAGNOSIS — I7025 Atherosclerosis of native arteries of other extremities with ulceration: Secondary | ICD-10-CM | POA: Diagnosis present

## 2013-10-02 DIAGNOSIS — D696 Thrombocytopenia, unspecified: Secondary | ICD-10-CM | POA: Diagnosis present

## 2013-10-02 DIAGNOSIS — G40909 Epilepsy, unspecified, not intractable, without status epilepticus: Secondary | ICD-10-CM | POA: Diagnosis present

## 2013-10-02 DIAGNOSIS — E876 Hypokalemia: Secondary | ICD-10-CM | POA: Diagnosis present

## 2013-10-02 DIAGNOSIS — G40309 Generalized idiopathic epilepsy and epileptic syndromes, not intractable, without status epilepticus: Secondary | ICD-10-CM | POA: Diagnosis present

## 2013-10-02 DIAGNOSIS — E785 Hyperlipidemia, unspecified: Secondary | ICD-10-CM | POA: Diagnosis present

## 2013-10-02 DIAGNOSIS — R569 Unspecified convulsions: Secondary | ICD-10-CM

## 2013-10-02 DIAGNOSIS — N186 End stage renal disease: Secondary | ICD-10-CM

## 2013-10-02 DIAGNOSIS — L98499 Non-pressure chronic ulcer of skin of other sites with unspecified severity: Secondary | ICD-10-CM | POA: Diagnosis present

## 2013-10-02 DIAGNOSIS — K922 Gastrointestinal hemorrhage, unspecified: Secondary | ICD-10-CM

## 2013-10-02 DIAGNOSIS — D631 Anemia in chronic kidney disease: Secondary | ICD-10-CM | POA: Diagnosis present

## 2013-10-02 DIAGNOSIS — M899 Disorder of bone, unspecified: Secondary | ICD-10-CM | POA: Diagnosis present

## 2013-10-02 DIAGNOSIS — K219 Gastro-esophageal reflux disease without esophagitis: Secondary | ICD-10-CM | POA: Diagnosis present

## 2013-10-02 DIAGNOSIS — I509 Heart failure, unspecified: Secondary | ICD-10-CM | POA: Diagnosis present

## 2013-10-02 DIAGNOSIS — K259 Gastric ulcer, unspecified as acute or chronic, without hemorrhage or perforation: Secondary | ICD-10-CM

## 2013-10-02 DIAGNOSIS — K2289 Other specified disease of esophagus: Secondary | ICD-10-CM | POA: Diagnosis present

## 2013-10-02 DIAGNOSIS — F79 Unspecified intellectual disabilities: Secondary | ICD-10-CM | POA: Diagnosis present

## 2013-10-02 DIAGNOSIS — K2210 Ulcer of esophagus without bleeding: Secondary | ICD-10-CM | POA: Diagnosis present

## 2013-10-02 DIAGNOSIS — R579 Shock, unspecified: Secondary | ICD-10-CM | POA: Diagnosis present

## 2013-10-02 DIAGNOSIS — K920 Hematemesis: Principal | ICD-10-CM | POA: Diagnosis present

## 2013-10-02 DIAGNOSIS — D649 Anemia, unspecified: Secondary | ICD-10-CM | POA: Diagnosis present

## 2013-10-02 DIAGNOSIS — Z992 Dependence on renal dialysis: Secondary | ICD-10-CM

## 2013-10-02 DIAGNOSIS — K2960 Other gastritis without bleeding: Secondary | ICD-10-CM | POA: Diagnosis present

## 2013-10-02 DIAGNOSIS — D62 Acute posthemorrhagic anemia: Secondary | ICD-10-CM | POA: Diagnosis present

## 2013-10-02 LAB — COMPREHENSIVE METABOLIC PANEL
ALT: 15 U/L (ref 0–53)
AST: 15 U/L (ref 0–37)
BUN: 14 mg/dL (ref 6–23)
CO2: 36 mEq/L — ABNORMAL HIGH (ref 19–32)
Calcium: 9.6 mg/dL (ref 8.4–10.5)
Chloride: 89 mEq/L — ABNORMAL LOW (ref 96–112)
Creatinine, Ser: 5.25 mg/dL — ABNORMAL HIGH (ref 0.50–1.35)
GFR calc non Af Amer: 10 mL/min — ABNORMAL LOW (ref >90)
Glucose, Bld: 249 mg/dL — ABNORMAL HIGH (ref 70–99)
Total Bilirubin: 0.2 mg/dL — ABNORMAL LOW (ref 0.3–1.2)

## 2013-10-02 LAB — RENAL FUNCTION PANEL
BUN: 31 mg/dL — ABNORMAL HIGH (ref 6–23)
CO2: 32 mEq/L (ref 19–32)
Chloride: 98 mEq/L (ref 96–112)
Creatinine, Ser: 5.46 mg/dL — ABNORMAL HIGH (ref 0.50–1.35)
GFR calc non Af Amer: 10 mL/min — ABNORMAL LOW (ref >90)
Potassium: 3.8 mEq/L (ref 3.5–5.1)

## 2013-10-02 LAB — CBC
HCT: 23.1 % — ABNORMAL LOW (ref 39.0–52.0)
HCT: 21.6 % — ABNORMAL LOW (ref 39.0–52.0)
HCT: 25.7 % — ABNORMAL LOW (ref 39.0–52.0)
HCT: 23 % — ABNORMAL LOW (ref 39.0–52.0)
Hemoglobin: 7.3 g/dL — ABNORMAL LOW (ref 13.0–17.0)
Hemoglobin: 6.8 g/dL — CL (ref 13.0–17.0)
Hemoglobin: 8.7 g/dL — ABNORMAL LOW (ref 13.0–17.0)
Hemoglobin: 8.2 g/dL — ABNORMAL LOW (ref 13.0–17.0)
MCH: 32.5 pg (ref 26.0–34.0)
MCH: 33 pg (ref 26.0–34.0)
MCH: 32.1 pg (ref 26.0–34.0)
MCH: 32.7 pg (ref 26.0–34.0)
MCHC: 33.3 g/dL (ref 30.0–36.0)
MCHC: 32.9 g/dL (ref 30.0–36.0)
MCHC: 35.7 g/dL (ref 30.0–36.0)
MCV: 93.1 fL (ref 78.0–100.0)
MCV: 91.6 fL (ref 78.0–100.0)
Platelets: 133 10*3/uL — ABNORMAL LOW (ref 150–400)
Platelets: 130 10*3/uL — ABNORMAL LOW (ref 150–400)
Platelets: 130 10*3/uL — ABNORMAL LOW (ref 150–400)
RBC: 2.37 MIL/uL — ABNORMAL LOW (ref 4.22–5.81)
RBC: 2.12 MIL/uL — ABNORMAL LOW (ref 4.22–5.81)
RBC: 2.76 MIL/uL — ABNORMAL LOW (ref 4.22–5.81)
RBC: 2.51 MIL/uL — ABNORMAL LOW (ref 4.22–5.81)
RDW: 14.2 % (ref 11.5–15.5)
RDW: 14.1 % (ref 11.5–15.5)
RDW: 15.9 % — ABNORMAL HIGH (ref 11.5–15.5)
WBC: 6.5 10*3/uL (ref 4.0–10.5)
WBC: 7.2 10*3/uL (ref 4.0–10.5)
WBC: 8.6 10*3/uL (ref 4.0–10.5)
WBC: 11.9 10*3/uL — ABNORMAL HIGH (ref 4.0–10.5)

## 2013-10-02 LAB — BASIC METABOLIC PANEL
BUN: 17 mg/dL (ref 6–23)
CO2: 35 mEq/L — ABNORMAL HIGH (ref 19–32)
Calcium: 8.4 mg/dL (ref 8.4–10.5)
Creatinine, Ser: 5.27 mg/dL — ABNORMAL HIGH (ref 0.50–1.35)
GFR calc non Af Amer: 10 mL/min — ABNORMAL LOW (ref >90)
Glucose, Bld: 157 mg/dL — ABNORMAL HIGH (ref 70–99)
Sodium: 147 mEq/L — ABNORMAL HIGH (ref 135–145)

## 2013-10-02 LAB — CBC WITH DIFFERENTIAL/PLATELET
Basophils Absolute: 0 10*3/uL (ref 0.0–0.1)
Basophils Relative: 0 % (ref 0–1)
Eosinophils Relative: 0 % (ref 0–5)
HCT: 26.8 % — ABNORMAL LOW (ref 39.0–52.0)
Hemoglobin: 8.7 g/dL — ABNORMAL LOW (ref 13.0–17.0)
Lymphocytes Relative: 28 % (ref 12–46)
MCHC: 32.5 g/dL (ref 30.0–36.0)
MCV: 98.9 fL (ref 78.0–100.0)
Monocytes Absolute: 0.2 10*3/uL (ref 0.1–1.0)
Monocytes Relative: 4 % (ref 3–12)
RDW: 14.1 % (ref 11.5–15.5)

## 2013-10-02 LAB — AMMONIA: Ammonia: 28 umol/L (ref 11–60)

## 2013-10-02 LAB — OCCULT BLOOD, POC DEVICE: Fecal Occult Bld: POSITIVE — AB

## 2013-10-02 LAB — GLUCOSE, CAPILLARY: Glucose-Capillary: 147 mg/dL — ABNORMAL HIGH (ref 70–99)

## 2013-10-02 LAB — LACTIC ACID, PLASMA
Lactic Acid, Venous: 7.6 mmol/L — ABNORMAL HIGH (ref 0.5–2.2)
Lactic Acid, Venous: 5.3 mmol/L — ABNORMAL HIGH (ref 0.5–2.2)

## 2013-10-02 LAB — APTT: aPTT: 29 seconds (ref 24–37)

## 2013-10-02 LAB — LIPASE, BLOOD: Lipase: 56 U/L (ref 11–59)

## 2013-10-02 LAB — MRSA PCR SCREENING: MRSA by PCR: NEGATIVE

## 2013-10-02 SURGERY — EGD (ESOPHAGOGASTRODUODENOSCOPY)
Anesthesia: Moderate Sedation

## 2013-10-02 MED ORDER — SODIUM CHLORIDE 0.9 % IV SOLN
80.0000 mg | Freq: Once | INTRAVENOUS | Status: AC
  Administered 2013-10-02: 80 mg via INTRAVENOUS
  Filled 2013-10-02: qty 80

## 2013-10-02 MED ORDER — VANCOMYCIN HCL 500 MG IV SOLR: 500.0000 mg | INTRAVENOUS | Status: DC

## 2013-10-02 MED ORDER — SODIUM CHLORIDE 0.9 % IV SOLN
8.0000 mg/h | INTRAVENOUS | Status: AC
  Administered 2013-10-02 – 2013-10-05 (×7): 8 mg/h via INTRAVENOUS
  Filled 2013-10-02 (×11): qty 80

## 2013-10-02 MED ORDER — SODIUM CHLORIDE 0.9 % IV BOLUS (SEPSIS)
500.0000 mL | Freq: Once | INTRAVENOUS | Status: AC
  Administered 2013-10-02: 500 mL via INTRAVENOUS

## 2013-10-02 MED ORDER — DARBEPOETIN ALFA-POLYSORBATE 200 MCG/0.4ML IJ SOLN
200.0000 ug | INTRAMUSCULAR | Status: DC
  Administered 2013-10-04: 200 ug via INTRAVENOUS
  Filled 2013-10-02: qty 0.4

## 2013-10-02 MED ORDER — POTASSIUM CHLORIDE 10 MEQ/100ML IV SOLN
10.0000 meq | INTRAVENOUS | Status: AC
  Administered 2013-10-02 (×2): 10 meq via INTRAVENOUS
  Filled 2013-10-02 (×2): qty 100

## 2013-10-02 MED ORDER — PIPERACILLIN-TAZOBACTAM 3.375 G IVPB
3.3750 g | Freq: Once | INTRAVENOUS | Status: AC
  Administered 2013-10-02: 3.375 g via INTRAVENOUS
  Filled 2013-10-02: qty 50

## 2013-10-02 MED ORDER — SODIUM CHLORIDE 0.9 % IV SOLN: INTRAVENOUS | Status: DC

## 2013-10-02 MED ORDER — PIPERACILLIN-TAZOBACTAM IN DEX 2-0.25 GM/50ML IV SOLN
2.2500 g | Freq: Three times a day (TID) | INTRAVENOUS | Status: DC
  Filled 2013-10-02 (×2): qty 50

## 2013-10-02 MED ORDER — ONDANSETRON HCL 4 MG/2ML IJ SOLN: 4.0000 mg | Freq: Four times a day (QID) | INTRAMUSCULAR | Status: DC | PRN

## 2013-10-02 MED ORDER — ONDANSETRON HCL 4 MG/2ML IJ SOLN: 4.0000 mg | Freq: Four times a day (QID) | INTRAMUSCULAR | Status: DC

## 2013-10-02 MED ORDER — DEXTROSE 5 % IV SOLN
30.0000 ug/min | INTRAVENOUS | Status: DC
  Administered 2013-10-02: 55 ug/min via INTRAVENOUS
  Administered 2013-10-03: 110 ug/min via INTRAVENOUS
  Filled 2013-10-02 (×3): qty 4

## 2013-10-02 MED ORDER — VANCOMYCIN HCL IN DEXTROSE 1-5 GM/200ML-% IV SOLN
1000.0000 mg | Freq: Once | INTRAVENOUS | Status: AC
  Administered 2013-10-02: 1000 mg via INTRAVENOUS
  Filled 2013-10-02: qty 200

## 2013-10-02 MED ORDER — DEXTROSE 5 % IV SOLN
30.0000 ug/min | INTRAVENOUS | Status: DC
  Administered 2013-10-02: 75 ug/min via INTRAVENOUS
  Administered 2013-10-02: 50 ug/min via INTRAVENOUS
  Administered 2013-10-02: 55 ug/min via INTRAVENOUS
  Administered 2013-10-02: 75 ug/min via INTRAVENOUS
  Filled 2013-10-02 (×4): qty 1

## 2013-10-02 MED ORDER — SODIUM CHLORIDE 0.9 % IV SOLN: 250.0000 mL | INTRAVENOUS | Status: DC | PRN

## 2013-10-02 MED ORDER — SODIUM CHLORIDE 0.9 % IV BOLUS (SEPSIS)
1000.0000 mL | Freq: Once | INTRAVENOUS | Status: AC
  Administered 2013-10-02: 1000 mL via INTRAVENOUS

## 2013-10-02 NOTE — Progress Notes (Signed)
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 BP has dropped since moved to ICU > now on phenylephrine 70  Interval Hx: - BP has dropped as above, pt remains awake eyes open, HR 80-90's - full vascular survey by Jasper Riling, NP by [place] > no viable options for CVC placement  Filed Vitals:   10/02/13 0745 10/02/13 0800 10/02/13 0815 10/02/13 0830  BP: 70/44 72/44 79/45  63/31  Pulse:   93 96  Temp:      TempSrc:      Resp: 36 23 23 31   Height:      Weight:      SpO2:  100% 100% 100%    Recent Labs Lab 10/02/13 0305 10/02/13 0700  HGB 8.7* 7.7*  HCT 26.8* 23.1*  WBC 4.0 6.5  PLT 175 133*    Recent Labs Lab 10/02/13 0305 10/02/13 0700  NA 145 147*  K 2.8* 3.0*  CL 89* 95*  CO2 36* 35*  GLUCOSE 249* 157*  BUN 14 17  CREATININE 5.25* 5.27*  CALCIUM 9.6 8.4   Updated Plans:   - Pt well known to Dr Myra Gianotti w [facility] and Jasper Riling has discussed pt with Dr Imogene Burn and with Dr Miles Costain in IR this am. There are no good options for vascular access based on venogram, likely no real options for vascular to perform cutdown, etc. The pt is on his second thigh HD access.  - continue supportive care with fluids, phenylephrine, PRBC as indicated - GI to see 12/14 - Called Renal Dr Arlean Hopping and notified him of admission - in light of extremely poor vascular access and low likelihood that presentation is due to infection, I will d/c his abx to preserve IV for his other meds. If he shows clinical signs infxn then we will restart broad abx - potassium being replaced - discussed current status with his 2 sisters and brother at bedside. Explained the complicating issue of poor IV access. Also broached subject of code status should he decompensate or arrest. They all agree that they want him to be full code under any circumstances.   CC time 45 minutes  Levy Pupa, MD, PhD 10/02/2013, 9:11 AM [facility] [phone] or if  no answer [phone]

## 2013-10-02 NOTE — ED Notes (Signed)
Patient appears more alert at this time. Pointing and requesting things located in room, specifically asking for emesis bag, but doesn't appear to be nauseated. Places emesis bag over face as if using an oxygen mask. Family remains at bedside.

## 2013-10-02 NOTE — ED Provider Notes (Signed)
CSN: [id number]     Arrival date & time 10/02/13  0221 History   First MD Initiated Contact with Patient 10/02/13 0236     Chief Complaint  Patient presents with  . Hematemesis   (Consider location/radiation/quality/duration/timing/severity/associated sxs/prior Treatment) HPI Comments: 62 yo AA male with MMP to include ESRD, h/o hemetemesis, Mute, MR, CHF, abd pain presents with cc of hemetemesis.    Pt recently seen for same on NOV 214.  Admitted.  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    Patient is a 62 y.o. male presenting with vomiting. The history is provided by the patient and the EMS personnel. The history is limited by a language barrier and the condition of the patient (Pt is nonverbal).  Emesis Severity:  Moderate Duration:  3 hours Timing:  Intermittent Number of daily episodes:  2 episodes of bloody emesis Progression:  Unchanged Chronicity:  Recurrent Context: not post-tussive and not self-induced   Relieved by:  Nothing Worsened by:  Nothing tried Ineffective treatments:  None tried Associated symptoms: no abdominal pain, no arthralgias, no chills, no cough, no diarrhea, no fever and no headaches     Past Medical History  Diagnosis Date  . Upper respiratory infection 12/12/2009  . Heart murmur, systolic 08/10/2009  . 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 Hypotension 05/26/2009  . Syncope 05/07/2009  . 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 Esophageal varices 11/07/2008  . Redness or discharge of eye 11/06/2008  . Skin tag 08/25/2008  . Gastric ulcer 10/09    antral, with h pylori positive  . Abdominal pain, acute, generalized 06/28/2008  . Congestive heart failure 03/06/2008  . Cellulitis and abscess of leg, except foot 03/06/2008  . Sinus tachycardia 03/06/2008  . Edema 03/06/2008  . Impacted cerumen of both ears 03/06/2008  . Secondary hyperparathyroidism 02/02/2008  . Mute 02/02/2008  . Mental  retardation 02/02/2008  . Hyperlipidemia 02/02/2008  . Anemia 02/02/2008  . ESRD (end stage renal disease)     TTS hemodialysis  . GERD (gastroesophageal reflux disease)   . Hypertension 02/02/2008    in history  . Seizures     "non in a while at home"   Past Surgical History  Procedure Laterality Date  . Left forearm graft      for HD  . Arteriovenous graft placement  11/22/10    Right thigh AVG  . Thrombectomy and revision of arterioventous (av) goretex  graft    . Thrombectomy and revision of arterioventous (av) goretex  graft  10/22/2012    Procedure: THROMBECTOMY AND REVISION OF ARTERIOVENTOUS (AV) GORETEX  GRAFT;  Surgeon: Larina Earthly, MD;  Location: [facility] OR;  Service: Vascular;  Laterality: Right;  . Thrombectomy w/ embolectomy  11/10/2012    Procedure: THROMBECTOMY ARTERIOVENOUS GORE-TEX GRAFT;  Surgeon: Pryor Ochoa, MD;  Location: [facility] OR;  Service: Vascular;  Laterality: Right;  . Thrombectomy and revision of arterioventous (av) goretex  graft Right 12/08/2012    Procedure: THROMBECTOMY AND REVISION OF ARTERIOVENTOUS (AV) GORETEX  GRAFT right thigh;  Surgeon: Sherren Kerns, MD;  Location: [facility] OR;  Service: Vascular;  Laterality: Right;  Susie Cassette N/A 12/08/2012    Procedure: VENOGRAM;  Surgeon: Sherren Kerns, MD;  Location: [facility] OR;  Service: Vascular;  Laterality: N/A;  Intraoperative Central venogram  . Thrombectomy w/ embolectomy Right 12/12/2012    Procedure: THROMBECTOMY ARTERIOVENOUS GORE-TEX GRAFT;  Surgeon: Nada Libman, MD;  Location: [facility]  OR;  Service: Vascular;  Laterality: Right;  . Insertion of dialysis catheter Left 12/14/2012    Procedure: INSERTION OF DIALYSIS CATHETER;  Surgeon: Nada Libman, MD;  Location: [facility] OR;  Service: Vascular;  Laterality: Left;  . Insertion of dialysis catheter Right 01/13/2013    Procedure: INSERTION OF DIALYSIS CATHETER;  Surgeon: Nada Libman, MD;  Location: [facility] OR;  Service: Vascular;  Laterality: Right;  . Removal of a  dialysis catheter Left 01/13/2013    Procedure: REMOVAL OF A DIALYSIS CATHETER;  Surgeon: Nada Libman, MD;  Location: [facility] OR;  Service: Vascular;  Laterality: Left;  . Av fistula placement Left 02/11/2013    Procedure: INSERTION OF ARTERIOVENOUS (AV) GORE-TEX GRAFT THIGH;  Surgeon: Nada Libman, MD;  Location: [facility] OR;  Service: Vascular;  Laterality: Left;  using 6mm x 50cm Gore-Tex Vascular Graft  . Esophagogastroduodenoscopy N/A 02/16/2013    Procedure: ESOPHAGOGASTRODUODENOSCOPY (EGD);  Surgeon: Vertell Novak., MD;  Location: [facility] ENDOSCOPY;  Service: Endoscopy;  Laterality: N/A;  bedside  . Esophagogastroduodenoscopy N/A 09/14/2013    Procedure: ESOPHAGOGASTRODUODENOSCOPY (EGD);  Surgeon: Vertell Novak., MD;  Location: [facility] ENDOSCOPY;  Service: Endoscopy;  Laterality: N/A;  control of bleeding if needed   No family history on file. History  Substance Use Topics  . Smoking status: Never Smoker   . Smokeless tobacco: Never Used  . Alcohol Use: No    Review of Systems  Unable to perform ROS: Patient nonverbal  Constitutional: Negative for chills.  Gastrointestinal: Positive for vomiting. Negative for abdominal pain and diarrhea.  Musculoskeletal: Negative for arthralgias.  Neurological: Negative for headaches.    Allergies  Review of patient's allergies indicates no known allergies.  Home Medications   Current Outpatient Rx  Name  Route  Sig  Dispense  Refill  . acetaminophen (TYLENOL) 325 MG tablet   Oral   Take 2 tablets (650 mg total) by mouth every 6 (six) hours as needed for moderate pain.   30 tablet   1   . atorvastatin (LIPITOR) 20 MG tablet   Oral   Take 1 tablet (20 mg total) by mouth daily.   30 tablet   11   . clonazePAM (KLONOPIN) 0.5 MG tablet   Oral   Take 0.5 tablets (0.25 mg total) by mouth 2 (two) times daily.   30 tablet   5   . folic acid-vitamin b complex-vitamin c-selenium-zinc (DIALYVITE) 3 MG TABS   Oral   Take 1 tablet by mouth  daily.   30 tablet   11   . midodrine (PROAMATINE) 10 MG tablet   Oral   Take 15 mg by mouth 3 (three) times daily.          . pantoprazole (PROTONIX) 40 MG tablet   Oral   Take 1 tablet (40 mg total) by mouth 2 (two) times daily.   30 tablet   11   . phenytoin (DILANTIN) 100 MG ER capsule   Oral   Take 2 capsules (200 mg total) by mouth at bedtime.   60 capsule   6   . sevelamer carbonate (RENVELA) 800 MG tablet   Oral   Take 800 mg by mouth 3 (three) times daily with meals.          BP 123/95  Temp(Src) 98.9 F (37.2 C) (Oral)  Resp 18 Physical Exam  Nursing note and vitals reviewed. Constitutional: He is oriented to person, place, and time. He appears well-developed and well-nourished.  HENT:  Head: Normocephalic and atraumatic.  Eyes: Conjunctivae and EOM are normal. Pupils are equal, round, and reactive to light.  Neck: Normal range of motion. Neck supple.  Cardiovascular: Normal rate and regular rhythm.  Exam reveals no friction rub.   Murmur heard. Pulmonary/Chest: Effort normal and breath sounds normal. He has no wheezes. He has no rales.  Abdominal: Soft. Bowel sounds are normal. He exhibits no distension and no mass. There is no tenderness. There is no rebound and no guarding.  Musculoskeletal: Normal range of motion.  Fistula in LLE  Neurological: He is alert and oriented to person, place, and time. He has normal reflexes.  Skin: Skin is warm and dry.    ED Course  Procedures (including critical care time) Labs Review Results for orders placed during the hospital encounter of 10/02/13  CBC WITH DIFFERENTIAL      Result Value Range   WBC 4.0  4.0 - 10.5 K/uL   RBC 2.71 (*) 4.22 - 5.81 MIL/uL   Hemoglobin 8.7 (*) 13.0 - 17.0 g/dL   HCT 84.6 (*) 96.2 - 95.2 %   MCV 98.9  78.0 - 100.0 fL   MCH 32.1  26.0 - 34.0 pg   MCHC 32.5  30.0 - 36.0 g/dL   RDW 84.1  32.4 - 40.1 %   Platelets 175  150 - 400 K/uL   Neutrophils Relative % 67  43 - 77 %    Neutro Abs 2.7  1.7 - 7.7 K/uL   Lymphocytes Relative 28  12 - 46 %   Lymphs Abs 1.1  0.7 - 4.0 K/uL   Monocytes Relative 4  3 - 12 %   Monocytes Absolute 0.2  0.1 - 1.0 K/uL   Eosinophils Relative 0  0 - 5 %   Eosinophils Absolute 0.0  0.0 - 0.7 K/uL   Basophils Relative 0  0 - 1 %   Basophils Absolute 0.0  0.0 - 0.1 K/uL  COMPREHENSIVE METABOLIC PANEL      Result Value Range   Sodium 145  135 - 145 mEq/L   Potassium 2.8 (*) 3.5 - 5.1 mEq/L   Chloride 89 (*) 96 - 112 mEq/L   CO2 36 (*) 19 - 32 mEq/L   Glucose, Bld 249 (*) 70 - 99 mg/dL   BUN 14  6 - 23 mg/dL   Creatinine, Ser 0.27 (*) 0.50 - 1.35 mg/dL   Calcium 9.6  8.4 - 25.3 mg/dL   Total Protein 7.2  6.0 - 8.3 g/dL   Albumin 3.8  3.5 - 5.2 g/dL   AST 15  0 - 37 U/L   ALT 15  0 - 53 U/L   Alkaline Phosphatase 134 (*) 39 - 117 U/L   Total Bilirubin 0.2 (*) 0.3 - 1.2 mg/dL   GFR calc non Af Amer 10 (*) >90 mL/min   GFR calc Af Amer 12 (*) >90 mL/min  LACTIC ACID, PLASMA      Result Value Range   Lactic Acid, Venous 7.6 (*) 0.5 - 2.2 mmol/L  LIPASE, BLOOD      Result Value Range   Lipase 56  11 - 59 U/L  APTT      Result Value Range   aPTT 29  24 - 37 seconds  PROTIME-INR      Result Value Range   Prothrombin Time 14.3  11.6 - 15.2 seconds   INR 1.13  0.00 - 1.49  AMMONIA      Result Value  Range   Ammonia 28  11 - 60 umol/L  OCCULT BLOOD, POC DEVICE      Result Value Range   Fecal Occult Bld POSITIVE (*) NEGATIVE  TYPE AND SCREEN      Result Value Range   ABO/RH(D) B POS     Antibody Screen PENDING     Sample Expiration 10/05/2013     Imaging Review No results found.  EKG Interpretation   None       MDM  No diagnosis found. Hematemesis ESRF on HD CHF H/O Hypotension Syncope    62 year old African male with multiple medical problems presents to ER with chief complaint of hematemesis. History obtained from patient's family member as he is mute and has MR. Per the patient's sister the patient had 2  episodes of dark emesis. He has a history of hematemesis and was admitted in November. EGD was unremarkable.  Initial blood pressure was normal, however his pressure dropped while while he was in the ER. IV access was obtained via EJ on Left.   500 ml fluid bolus was initiated.  H/H is 8.7/26.8.  Will continue with NS bolus - additional 1L NS ordered.    3:50 AM Stat page was placed to critical care. Case was discussed with critical care attending who will see patient in ER.  Will cover with Zosyn for improved morbidity and mortality in the face of UGIB.    4:09 AM Case d/w GI on call Dr. Loreta Ave.  Plan for EGD in AM once stabilized. BP improving with IVF.    5:02 AM IVF resuscitation continued.  CC has evaluated and plan for admission.  No change in management at this time.    CRITICAL CARE Performed by: Redgie Grayer, DAVID   Total critical care time: 35  Critical care time was exclusive of separately billable procedures and treating other patients.  Critical care was necessary to treat or prevent imminent or life-threatening deterioration.  Critical care was time spent personally by me on the following activities: development of treatment plan with patient and/or surrogate as well as nursing, discussions with consultants, evaluation of patient's response to treatment, examination of patient, obtaining history from patient or surrogate, ordering and performing treatments and interventions, ordering and review of laboratory studies, ordering and review of radiographic studies, pulse oximetry and re-evaluation of patient's condition.  Angiocath insertion Performed by: Redgie Grayer, DAVID  Consent: Verbal consent obtained. Risks and benefits: risks, benefits and alternatives were discussed Time out: Immediately prior to procedure a "time out" was called to verify the correct patient, procedure, equipment, support staff and site/side marked as required.  Preparation: Patient was prepped and draped in  the usual sterile fashion.  Vein Location: L EJ  Not Ultrasound Guided  Gauge: 18  Normal blood return and flush without difficulty Patient tolerance: Patient tolerated the procedure well with no immediate complications.  7:07 AM Prolonged ER stay awaiting ICU admission.  VS remained the same. Pt still hypotensive, but awake and alert and in NAD.  Plan to t/s to ICU.  Family aware of plan.    Darlys Gales, MD 10/02/13 [phone]

## 2013-10-02 NOTE — Progress Notes (Signed)
ANTIBIOTIC CONSULT NOTE - INITIAL  Pharmacy Consult for Vancomycin and Zosyn  Indication: rule out sepsis  No Known Allergies  Patient Measurements: Height: 5' 4.96" (165 cm) Weight: 123 lb 7.3 oz (56 kg) IBW/kg (Calculated) : 61.41  Vital Signs: Temp: 98.9 F (37.2 C) (12/14 0235) Temp src: Oral (12/14 0235) BP: 91/39 mmHg (12/14 0515) Pulse Rate: 99 (12/14 0515) Intake/Output from previous day:   Intake/Output from this shift:    Labs:  Recent Labs  10/02/13 0305  WBC 4.0  HGB 8.7*  PLT 175  CREATININE 5.25*   Estimated Creatinine Clearance: 11.3 ml/min (by C-G formula based on Cr of 5.25). No results found for this basename: VANCOTROUGH, Leodis Binet, VANCORANDOM, GENTTROUGH, GENTPEAK, GENTRANDOM, TOBRATROUGH, TOBRAPEAK, TOBRARND, AMIKACINPEAK, AMIKACINTROU, AMIKACIN,  in the last 72 hours   Microbiology: Recent Results (from the past 720 hour(s))  MRSA PCR SCREENING     Status: None   Collection Time    09/14/13 12:08 PM      Result Value Range Status   MRSA by PCR NEGATIVE  NEGATIVE Final   Comment:            The GeneXpert MRSA Assay (FDA     approved for NASAL specimens     only), is one component of a     comprehensive MRSA colonization     surveillance program. It is not     intended to diagnose MRSA     infection nor to guide or     monitor treatment for     MRSA infections.    Medical History: Past Medical History  Diagnosis Date  . Upper respiratory infection 12/12/2009  . Heart murmur, systolic 08/10/2009  . 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 Hypotension 05/26/2009  . Syncope 05/07/2009  . 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 Esophageal varices 11/07/2008  . Redness or discharge of eye 11/06/2008  . Skin tag 08/25/2008  . Gastric ulcer 10/09    antral, with h pylori positive  . Abdominal pain, acute, generalized 06/28/2008  . Congestive heart failure 03/06/2008  . Cellulitis and abscess of leg, except foot 03/06/2008  . Sinus tachycardia  03/06/2008  . Edema 03/06/2008  . Impacted cerumen of both ears 03/06/2008  . Secondary hyperparathyroidism 02/02/2008  . Mute 02/02/2008  . Mental retardation 02/02/2008  . Hyperlipidemia 02/02/2008  . Anemia 02/02/2008  . ESRD (end stage renal disease)     TTS hemodialysis  . GERD (gastroesophageal reflux disease)   . Hypertension 02/02/2008    in history  . Seizures     "non in a while at home"    Medications:  APAP  Lipitor  Klonopin  Nephrovite  Proamatine  Protonix  Dilantin  Renvela  Assessment: 62  yo male with UGIB, possible sepsis, for empiric antibiotics  Goal of Therapy:  Vancomycin pre-HD level 15-25  Plan:  Vancomycin 1 g IV now, then 500 mg IV after each HD Zosyn 2.25 g IV q8h  Eddie Candle 10/02/2013,6:03 AM

## 2013-10-02 NOTE — Progress Notes (Signed)
Panic Hcg received from lab 6.8, RN Liborio Nixon notified.

## 2013-10-02 NOTE — Consult Note (Signed)
[facility] Renal Consultation Note    Indication for Consultation:  Management of ESRD/hemodialysis; anemia, hypertension/volume and secondary hyperparathyroidism  HPI: [name] is a 62 y.o. male ESRD patient (TTS HD) with multiple medical problems who presented to the ED with complaints of recurrent hematemesis and a recent admission for similar symptoms 11/26-11/29. EGD during that hospitalization was negative for acute bleed or varices. Outpatient hemoglobins have not returned to his baseline of 10-11.  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  He is resting quietly in bed and does not appear to be in any distress, however he is usually more alert and animated when he feels well. No family is present to assist with history. He completed a full HD treatment on Saturday.  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 11/10/12   Thrombectomy, R thigh graft 12/08/12   Thrombectomy, R thigh graft, shuntogram with no stenosis, good flow, repeat clotting maybe due to chronic hypotension 12/12/12  Thrombectomy, R thigh graft 12/14/12  L femoral tunneled HD cath placement 3/4 - 12/25/12  Seizure witnessed after HD, low BP (chronic), continue dilantin and klonopin, continue midodrine, ^LFT's, mild > follow as op 01/13/13  Removal L thigh cath, placement R femoral diatek 02/11/13  New L thigh AVG placed 4/27-02/20/13  UGIB w CG emesis, acute on chronic hypotension, anemia > egd 4/20 w no bleeding lesion, rx'd with octreotide drip (hx varices) and ppi drip, stable at d/c, chronic hypotension Bp 90's 11/26-11/29/14  UGIB / hematemesis, lactic acidosis, similar to 4/14 admission >> EGD with no active bleeding source, +esoph and antral gastric erosions, Rx with octreotide and PPI drips. Question  diagnosis of cirrhosis >> LFT's were normal and abd [place] of liver did not show signs of cirrhosis      Past Medical History  Diagnosis Date  . Upper respiratory infection 12/12/2009  . Heart murmur, systolic 08/10/2009  . 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 Hypotension 05/26/2009  . Syncope 05/07/2009  . 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 Esophageal varices 11/07/2008  . Redness or discharge of eye 11/06/2008  . Skin tag 08/25/2008  . Gastric ulcer 10/09    antral, with h pylori positive  . Abdominal pain, acute, generalized 06/28/2008  . Congestive heart failure 03/06/2008  . Cellulitis and abscess of leg, except foot 03/06/2008  . Sinus tachycardia 03/06/2008  . Edema 03/06/2008  . Impacted cerumen of both ears 03/06/2008  . Secondary hyperparathyroidism 02/02/2008  . Mute 02/02/2008  . Mental retardation 02/02/2008  . Hyperlipidemia 02/02/2008  . Anemia 02/02/2008  . ESRD (end stage renal disease)     TTS hemodialysis  . GERD (gastroesophageal reflux disease)   . Hypertension 02/02/2008    in history  . Seizures     "non in a while at home"   Past Surgical History  Procedure Laterality Date  . Left forearm graft      for HD  . Arteriovenous graft placement  11/22/10    Right thigh AVG  . Thrombectomy and revision of arterioventous (av) goretex  graft    . Thrombectomy and revision of arterioventous (av) goretex  graft  10/22/2012    Procedure: THROMBECTOMY AND REVISION OF ARTERIOVENTOUS (AV) GORETEX  GRAFT;  Surgeon: Larina Earthly, MD;  Location: [facility] OR;  Service: Vascular;  Laterality: Right;  . Thrombectomy w/ embolectomy  11/10/2012    Procedure: THROMBECTOMY ARTERIOVENOUS GORE-TEX GRAFT;  Surgeon: Pryor Ochoa, MD;  Location: [facility] OR;  Service: Vascular;  Laterality: Right;  . Thrombectomy and revision of arterioventous (av) goretex  graft Right 12/08/2012    Procedure: THROMBECTOMY AND REVISION OF ARTERIOVENTOUS (AV) GORETEX  GRAFT right thigh;  Surgeon: Sherren Kerns,  MD;  Location: [facility] OR;  Service: Vascular;  Laterality: Right;  Susie Cassette N/A 12/08/2012    Procedure: VENOGRAM;  Surgeon: Sherren Kerns, MD;  Location: [facility] OR;  Service: Vascular;  Laterality: N/A;  Intraoperative Central venogram  . Thrombectomy w/ embolectomy Right 12/12/2012    Procedure: THROMBECTOMY ARTERIOVENOUS GORE-TEX GRAFT;  Surgeon: Nada Libman, MD;  Location: [facility] OR;  Service: Vascular;  Laterality: Right;  . Insertion of dialysis catheter Left 12/14/2012    Procedure: INSERTION OF DIALYSIS CATHETER;  Surgeon: Nada Libman, MD;  Location: [facility] OR;  Service: Vascular;  Laterality: Left;  . Insertion of dialysis catheter Right 01/13/2013    Procedure: INSERTION OF DIALYSIS CATHETER;  Surgeon: Nada Libman, MD;  Location: [facility] OR;  Service: Vascular;  Laterality: Right;  . Removal of a dialysis catheter Left 01/13/2013    Procedure: REMOVAL OF A DIALYSIS CATHETER;  Surgeon: Nada Libman, MD;  Location: [facility] OR;  Service: Vascular;  Laterality: Left;  . Av fistula placement Left 02/11/2013    Procedure: INSERTION OF ARTERIOVENOUS (AV) GORE-TEX GRAFT THIGH;  Surgeon: Nada Libman, MD;  Location: [facility] OR;  Service: Vascular;  Laterality: Left;  using 6mm x 50cm Gore-Tex Vascular Graft  . Esophagogastroduodenoscopy N/A 02/16/2013    Procedure: ESOPHAGOGASTRODUODENOSCOPY (EGD);  Surgeon: Vertell Novak., MD;  Location: [facility] ENDOSCOPY;  Service: Endoscopy;  Laterality: N/A;  bedside  . Esophagogastroduodenoscopy N/A 09/14/2013    Procedure: ESOPHAGOGASTRODUODENOSCOPY (EGD);  Surgeon: Vertell Novak., MD;  Location: [facility] ENDOSCOPY;  Service: Endoscopy;  Laterality: N/A;  control of bleeding if needed   History reviewed. No pertinent family history. Social History:  reports that he has never smoked. He has never used smokeless tobacco. He reports that he does not drink alcohol or use illicit drugs. No Known Allergies Prior to Admission medications   Medication Sig Start Date End Date  Taking? Authorizing Provider  acetaminophen (TYLENOL) 325 MG tablet Take 2 tablets (650 mg total) by mouth every 6 (six) hours as needed for moderate pain. 08/29/13  Yes Manuela Schwartz, MD  atorvastatin (LIPITOR) 20 MG tablet Take 1 tablet (20 mg total) by mouth daily. 04/27/13  Yes Linward Headland, MD  clonazePAM (KLONOPIN) 0.5 MG tablet Take 0.5 tablets (0.25 mg total) by mouth 2 (two) times daily. 04/05/13  Yes Linward Headland, MD  folic acid-vitamin b complex-vitamin c-selenium-zinc (DIALYVITE) 3 MG TABS Take 1 tablet by mouth daily. 01/18/13  Yes Ulyess Mort, MD  midodrine (PROAMATINE) 10 MG tablet Take 15 mg by mouth 3 (three) times daily.    Yes Historical Provider, MD  pantoprazole (PROTONIX) 40 MG tablet Take 1 tablet (40 mg total) by mouth 2 (two) times daily. 09/17/13  Yes Annett Gula, MD  phenytoin (DILANTIN) 100 MG ER capsule Take 2 capsules (200 mg total) by mouth at bedtime. 03/02/13  Yes Nilda Riggs, NP  sevelamer carbonate (RENVELA) 800 MG tablet Take 800 mg by mouth 3 (three) times daily with meals.   Yes Historical Provider, MD   Current Facility-Administered Medications  Medication Dose Route Frequency Provider Last Rate Last Dose  . 0.9 %  sodium chloride infusion  250 mL Intravenous PRN Curlene Dolphin, MD      . 0.9 %  sodium chloride infusion   Intravenous Continuous Charna Elizabeth, MD      . pantoprazole (PROTONIX) 80 mg in sodium chloride 0.9 % 250 mL infusion  8 mg/hr Intravenous Continuous Curlene Dolphin, MD 25 mL/hr at 10/02/13 1500 8 mg/hr at 10/02/13 1500  . phenylephrine (NEO-SYNEPHRINE) 10 mg in dextrose 5 % 250 mL infusion  30-200 mcg/min Intravenous Titrated Curlene Dolphin, MD 97.5 mL/hr at 10/02/13 1500 65 mcg/min at 10/02/13 1500   Labs: Basic Metabolic Panel:  Recent Labs Lab 10/02/13 0305 10/02/13 0700  NA 145 147*  K 2.8* 3.0*  CL 89* 95*  CO2 36* 35*  GLUCOSE 249* 157*  BUN 14 17  CREATININE 5.25* 5.27*  CALCIUM 9.6  8.4   Liver Function Tests:  Recent Labs Lab 10/02/13 0305  AST 15  ALT 15  ALKPHOS 134*  BILITOT 0.2*  PROT 7.2  ALBUMIN 3.8    Recent Labs Lab 10/02/13 0305  LIPASE 56    Recent Labs Lab 10/02/13 0305  AMMONIA 28   CBC:  Recent Labs Lab 10/02/13 0305 10/02/13 0700 10/02/13 1020 10/02/13 1500  WBC 4.0 6.5 7.2 8.6  NEUTROABS 2.7  --   --   --   HGB 8.7* 7.7* 7.3* 6.8*  HCT 26.8* 23.1* 21.6* 20.7*  MCV 98.9 97.5 97.7 97.6  PLT 175 133* 130* 130*   CBG:  Recent Labs Lab 10/02/13 0726  GLUCAP 147*   IStudies/Results: Dg Chest Port 1 View  10/02/2013   CLINICAL DATA:  Hematemesis.  EXAM: PORTABLE CHEST - 1 VIEW  COMPARISON:  Chest radiograph performed 09/14/2013  FINDINGS: There is persistent elevation of the right hemidiaphragm. Patchy left-sided airspace opacity raises concern for pneumonia. No pleural effusion or pneumothorax is seen.  The cardiomediastinal silhouette is normal in size. No acute osseous abnormalities are identified.  IMPRESSION: Patchy left-sided airspace opacity, concerning for pneumonia.   Electronically Signed   By: Roanna Raider M.D.   On: 10/02/2013 04:52    ROS: Unable to complete - patient is mute   Physical Exam: Filed Vitals:   10/02/13 1400 10/02/13 1500 10/02/13 1547 10/02/13 1600  BP: 90/47 93/50  89/52  Pulse: 98 99  25  Temp:   98.7 F (37.1 C) 98.7 F (37.1 C)  TempSrc:   Oral Oral  Resp: 24 21  22   Height:      Weight:      SpO2: 100% 100%  42%     General: Calm, short stature, NAD. Head: Normocephalic, atraumatic, mucus membranes are moist Neck: Supple. JVD not elevated. Left EJ IV access. Lungs: Mostly clear. Poor effort. No appreciable wheezes, rales, or rhonchi. Breathing is unlabored. Heart: RRR with S1 S2. No murmurs, rubs, or gallops appreciated. Abdomen: Soft, non-tender, non-distended with normoactive bowel sounds. No rebound/guarding. No obvious abdominal masses. M-S:  Strength and tone appear  normal for age. Lower extremities:without edema or ischemic changes, no open wounds  Neuro: Alert, Moves all extremities spontaneously, Exam limited (mute) Psych:  Exam limited (mute) Dialysis Access: L thigh AVG + bruit/ no evidence of infection  Dialysis: [facility] TTS 3:45hrs   2K/2Ca bath    56kg     Heparin none   L thigh AVG   400/800  Hectorol 12     Epogen  15000    Venofer 50 weekly  Recent labs: Hgb 8.6, Tsat 34%, P 4.9, PTH 617, Alb 4.3  Assessment/Plan: 1. Hematemesis - Mgmt per primary. On PPI. No varices on recent EGD. Plans to hold off on endoscopy until stable.  2. ABLA - Hgb trending down today, now 6.8 < 8.7 upon admission. Max Aranesp. Transfusion of 1 unit PRBCs planned. Follow CBC 3. Hypotension - Soft BPs at baseline. Here 90's systolic on pressors and s/p 1.5L fluid bolus. Blood cx pending. Empiric abx on hold for now due to limited IV access. 4. ESRD -  TTS, K 3.0, repletion in progress. Next HD 12/16, no heparin. 5. Volume - Under EDW. UF as  BP tolerates  6. Metabolic bone disease -  Ca and Phos controlled. Continue low Ca bath and Vit D. Resume Renvela 1 AC once eating. 7. Nutrition - NPO for now. Renal diet and multivitamin once eating. 8. HD access - Uses L thigh AVG/ limited access options. Steady decline in access flows from > 1000 in September to now in the 600's. Shuntogram ordered outpatient, but must be done under sedation due to his MR and anxiety. Consider coordinating during this admit once condition stabilizes. 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 Goals of care - Family desires full code under any circumstances per notes.  Claud Kelp, PA-C [facility] Pager [phone] 10/02/2013, 4:10 PM   I have seen and examined patient, discussed with PA and agree with assessment and plan as outlined above.  62 yo AAM with hx of mutism, anxiety presented with hematemesis.  This is third or fourth admission this year with similar presentation.  He was thought  to have diagnosis or cirrhosis and varices, but evaluation on last admit showed "normal" appearing liver on [place] and normal LFT's.  EGD's this year have not shown varices, just gastric and esophageal erosions and duodenal thickening one time. Transfusion planned today, will follow for dialysis on TTS schedule.  He has known and chronic hypotension with highest BP's in the 90's.  Vinson Moselle MD pager [phone]    cell [phone] 10/02/2013, 6:16 PM

## 2013-10-02 NOTE — Consult Note (Signed)
Unassigned patient Reason for Consult: Hematemesis with anemia. Referring Physician: THP  BOLUWATIFE [name] is an 62 y.o. male.  HPI: 62 year old mute, black male, with multiple medical issues mentioned below, presented to the ER with a history of recurrent hematemesis. He had similar complaints last month, when Dr. Carman Ching did an EGD but no definite source of bleeding was identified and NO esophageal varices were noted. There were a few gastric erosions present.     Past Medical History  Diagnosis Date  . Upper respiratory infection 12/12/2009  . Heart murmur, systolic 08/10/2009  . 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 Hypotension 05/26/2009  . Syncope 05/07/2009  . 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 Esophageal varices 11/07/2008  . Redness or discharge of eye 11/06/2008  . Skin tag 08/25/2008  . Gastric ulcer 10/09    antral, with h pylori positive  . Abdominal pain, acute, generalized 06/28/2008  . Congestive heart failure 03/06/2008  . Cellulitis and abscess of leg, except foot 03/06/2008  . Sinus tachycardia 03/06/2008  . Edema 03/06/2008  . Impacted cerumen of both ears 03/06/2008  . Secondary hyperparathyroidism 02/02/2008  . Mute 02/02/2008  . Mental retardation 02/02/2008  . Hyperlipidemia 02/02/2008  . Anemia 02/02/2008  . ESRD (end stage renal disease)     TTS hemodialysis  . GERD (gastroesophageal reflux disease)   . Hypertension 02/02/2008    in history  . Seizures     "non in a while at home"   Past Surgical History  Procedure Laterality Date  . Left forearm graft      for HD  . Arteriovenous graft placement  11/22/10    Right thigh AVG  . Thrombectomy and revision of arterioventous (av) goretex  graft    . Thrombectomy and revision of arterioventous (av) goretex  graft  10/22/2012    Procedure: THROMBECTOMY AND REVISION OF ARTERIOVENTOUS (AV) GORETEX  GRAFT;  Surgeon: Larina Earthly, MD;  Location: [facility] OR;  Service: Vascular;  Laterality: Right;  . Thrombectomy  w/ embolectomy  11/10/2012    Procedure: THROMBECTOMY ARTERIOVENOUS GORE-TEX GRAFT;  Surgeon: Pryor Ochoa, MD;  Location: [facility] OR;  Service: Vascular;  Laterality: Right;  . Thrombectomy and revision of arterioventous (av) goretex  graft Right 12/08/2012    Procedure: THROMBECTOMY AND REVISION OF ARTERIOVENTOUS (AV) GORETEX  GRAFT right thigh;  Surgeon: Sherren Kerns, MD;  Location: [facility] OR;  Service: Vascular;  Laterality: Right;  Susie Cassette N/A 12/08/2012    Procedure: VENOGRAM;  Surgeon: Sherren Kerns, MD;  Location: [facility] OR;  Service: Vascular;  Laterality: N/A;  Intraoperative Central venogram  . Thrombectomy w/ embolectomy Right 12/12/2012    Procedure: THROMBECTOMY ARTERIOVENOUS GORE-TEX GRAFT;  Surgeon: Nada Libman, MD;  Location: [facility] OR;  Service: Vascular;  Laterality: Right;  . Insertion of dialysis catheter Left 12/14/2012    Procedure: INSERTION OF DIALYSIS CATHETER;  Surgeon: Nada Libman, MD;  Location: [facility] OR;  Service: Vascular;  Laterality: Left;  . Insertion of dialysis catheter Right 01/13/2013    Procedure: INSERTION OF DIALYSIS CATHETER;  Surgeon: Nada Libman, MD;  Location: [facility] OR;  Service: Vascular;  Laterality: Right;  . Removal of a dialysis catheter Left 01/13/2013    Procedure: REMOVAL OF A DIALYSIS CATHETER;  Surgeon: Nada Libman, MD;  Location: [facility] OR;  Service: Vascular;  Laterality: Left;  . Av fistula placement Left 02/11/2013    Procedure: INSERTION OF ARTERIOVENOUS (AV) GORE-TEX GRAFT THIGH;  Surgeon: Fran Lowes  Myra Gianotti, MD;  Location: [facility] OR;  Service: Vascular;  Laterality: Left;  using 6mm x 50cm Gore-Tex Vascular Graft  . Esophagogastroduodenoscopy N/A 02/16/2013    Procedure: ESOPHAGOGASTRODUODENOSCOPY (EGD);  Surgeon: Vertell Novak., MD;  Location: [facility] ENDOSCOPY;  Service: Endoscopy;  Laterality: N/A;  bedside  . Esophagogastroduodenoscopy N/A 09/14/2013    Procedure: ESOPHAGOGASTRODUODENOSCOPY (EGD);  Surgeon: Vertell Novak., MD;  Location: [facility]  ENDOSCOPY;  Service: Endoscopy;  Laterality: N/A;  control of bleeding if needed   History reviewed. No pertinent family history.  Social History:  reports that he has never smoked. He has never used smokeless tobacco. He reports that he does not drink alcohol or use illicit drugs.  Allergies: No Known Allergies  Medications: I have reviewed the patient's current medications.  Results for orders placed during the hospital encounter of 10/02/13 (from the past 48 hour(s))  CBC WITH DIFFERENTIAL     Status: Abnormal   Collection Time    10/02/13  3:05 AM      Result Value Range   WBC 4.0  4.0 - 10.5 K/uL   RBC 2.71 (*) 4.22 - 5.81 MIL/uL   Hemoglobin 8.7 (*) 13.0 - 17.0 g/dL   HCT 40.9 (*) 81.1 - 91.4 %   MCV 98.9  78.0 - 100.0 fL   MCH 32.1  26.0 - 34.0 pg   MCHC 32.5  30.0 - 36.0 g/dL   RDW 78.2  95.6 - 21.3 %   Platelets 175  150 - 400 K/uL   Neutrophils Relative % 67  43 - 77 %   Neutro Abs 2.7  1.7 - 7.7 K/uL   Lymphocytes Relative 28  12 - 46 %   Lymphs Abs 1.1  0.7 - 4.0 K/uL   Monocytes Relative 4  3 - 12 %   Monocytes Absolute 0.2  0.1 - 1.0 K/uL   Eosinophils Relative 0  0 - 5 %   Eosinophils Absolute 0.0  0.0 - 0.7 K/uL   Basophils Relative 0  0 - 1 %   Basophils Absolute 0.0  0.0 - 0.1 K/uL  COMPREHENSIVE METABOLIC PANEL     Status: Abnormal   Collection Time    10/02/13  3:05 AM      Result Value Range   Sodium 145  135 - 145 mEq/L   Potassium 2.8 (*) 3.5 - 5.1 mEq/L   Chloride 89 (*) 96 - 112 mEq/L   CO2 36 (*) 19 - 32 mEq/L   Glucose, Bld 249 (*) 70 - 99 mg/dL   BUN 14  6 - 23 mg/dL   Creatinine, Ser 0.86 (*) 0.50 - 1.35 mg/dL   Calcium 9.6  8.4 - 57.8 mg/dL   Total Protein 7.2  6.0 - 8.3 g/dL   Albumin 3.8  3.5 - 5.2 g/dL   AST 15  0 - 37 U/L   ALT 15  0 - 53 U/L   Alkaline Phosphatase 134 (*) 39 - 117 U/L   Total Bilirubin 0.2 (*) 0.3 - 1.2 mg/dL   GFR calc non Af Amer 10 (*) >90 mL/min   GFR calc Af Amer 12 (*) >90 mL/min   Comment: (NOTE)     The  eGFR has been calculated using the CKD EPI equation.     This calculation has not been validated in all clinical situations.     eGFR's persistently <90 mL/min signify possible Chronic Kidney     Disease.  LACTIC ACID, PLASMA  Status: Abnormal   Collection Time    10/02/13  3:05 AM      Result Value Range   Lactic Acid, Venous 7.6 (*) 0.5 - 2.2 mmol/L  LIPASE, BLOOD     Status: None   Collection Time    10/02/13  3:05 AM      Result Value Range   Lipase 56  11 - 59 U/L  APTT     Status: None   Collection Time    10/02/13  3:05 AM      Result Value Range   aPTT 29  24 - 37 seconds  PROTIME-INR     Status: None   Collection Time    10/02/13  3:05 AM      Result Value Range   Prothrombin Time 14.3  11.6 - 15.2 seconds   INR 1.13  0.00 - 1.49  AMMONIA     Status: None   Collection Time    10/02/13  3:05 AM      Result Value Range   Ammonia 28  11 - 60 umol/L  TYPE AND SCREEN     Status: None   Collection Time    10/02/13  3:05 AM      Result Value Range   ABO/RH(D) B POS     Antibody Screen NEG     Sample Expiration 10/05/2013    OCCULT BLOOD, POC DEVICE     Status: Abnormal   Collection Time    10/02/13  3:30 AM      Result Value Range   Fecal Occult Bld POSITIVE (*) NEGATIVE  CBC     Status: Abnormal   Collection Time    10/02/13  7:00 AM      Result Value Range   WBC 6.5  4.0 - 10.5 K/uL   RBC 2.37 (*) 4.22 - 5.81 MIL/uL   Hemoglobin 7.7 (*) 13.0 - 17.0 g/dL   HCT 16.1 (*) 09.6 - 04.5 %   MCV 97.5  78.0 - 100.0 fL   MCH 32.5  26.0 - 34.0 pg   MCHC 33.3  30.0 - 36.0 g/dL   RDW 40.9  81.1 - 91.4 %   Platelets 133 (*) 150 - 400 K/uL   Comment: REPEATED TO VERIFY     DELTA CHECK NOTED  BASIC METABOLIC PANEL     Status: Abnormal   Collection Time    10/02/13  7:00 AM      Result Value Range   Sodium 147 (*) 135 - 145 mEq/L   Potassium 3.0 (*) 3.5 - 5.1 mEq/L   Chloride 95 (*) 96 - 112 mEq/L   CO2 35 (*) 19 - 32 mEq/L   Glucose, Bld 157 (*) 70 - 99 mg/dL    BUN 17  6 - 23 mg/dL   Creatinine, Ser 7.82 (*) 0.50 - 1.35 mg/dL   Calcium 8.4  8.4 - 95.6 mg/dL   GFR calc non Af Amer 10 (*) >90 mL/min   GFR calc Af Amer 12 (*) >90 mL/min   Comment: (NOTE)     The eGFR has been calculated using the CKD EPI equation.     This calculation has not been validated in all clinical situations.     eGFR's persistently <90 mL/min signify possible Chronic Kidney     Disease.  LACTIC ACID, PLASMA     Status: Abnormal   Collection Time    10/02/13  7:00 AM      Result Value Range  Lactic Acid, Venous 5.3 (*) 0.5 - 2.2 mmol/L  MRSA PCR SCREENING     Status: None   Collection Time    10/02/13  7:16 AM      Result Value Range   MRSA by PCR NEGATIVE  NEGATIVE   Comment:            The GeneXpert MRSA Assay (FDA     approved for NASAL specimens     only), is one component of a     comprehensive MRSA colonization     surveillance program. It is not     intended to diagnose MRSA     infection nor to guide or     monitor treatment for     MRSA infections.  GLUCOSE, CAPILLARY     Status: Abnormal   Collection Time    10/02/13  7:26 AM      Result Value Range   Glucose-Capillary 147 (*) 70 - 99 mg/dL  CBC     Status: Abnormal   Collection Time    10/02/13 10:20 AM      Result Value Range   WBC 7.2  4.0 - 10.5 K/uL   RBC 2.21 (*) 4.22 - 5.81 MIL/uL   Hemoglobin 7.3 (*) 13.0 - 17.0 g/dL   HCT 62.1 (*) 30.8 - 65.7 %   MCV 97.7  78.0 - 100.0 fL   MCH 33.0  26.0 - 34.0 pg   MCHC 33.8  30.0 - 36.0 g/dL   RDW 84.6  96.2 - 95.2 %   Platelets 130 (*) 150 - 400 K/uL   Dg Chest Port 1 View  10/02/2013   CLINICAL DATA:  Hematemesis.  EXAM: PORTABLE CHEST - 1 VIEW  COMPARISON:  Chest radiograph performed 09/14/2013  FINDINGS: There is persistent elevation of the right hemidiaphragm. Patchy left-sided airspace opacity raises concern for pneumonia. No pleural effusion or pneumothorax is seen.  The cardiomediastinal silhouette is normal in size. No acute osseous  abnormalities are identified.  IMPRESSION: Patchy left-sided airspace opacity, concerning for pneumonia.   Electronically Signed   By: Roanna Raider M.D.   On: 10/02/2013 04:52   Review of Systems  Unable to perform ROS  Blood pressure 82/44, pulse 98, temperature 97.3 F (36.3 C), temperature source Oral, resp. rate 25, height 5' 4.96" (1.65 m), weight 53.4 kg (117 lb 11.6 oz), SpO2 100.00%. Physical Exam  Constitutional: He appears well-developed and well-nourished.  HENT:  Head: Normocephalic and atraumatic.  Eyes: Conjunctivae and EOM are normal. Pupils are equal, round, and reactive to light.  Neck: Normal range of motion. Neck supple.  Cardiovascular: Normal rate and regular rhythm.   Respiratory: Effort normal and breath sounds normal.  GI: Soft. Bowel sounds are normal.  Neurological:  Patient is mute and therefore his neuro eval is limited    Assessment/Plan: 1) Hematemesis with anemia and hypotension on pressors now-NO varices noted on a recent EGD done on 09/14/13 Patient has not had any further bleeding this morning. On pressors at the present time. Will hold off on doing an endoscopy till he stabilizes; IV access is also very limited and therefore we will hold off for now. 2) ESRD on HD.  3) SVC syndrome.  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 Lauro Manlove 10/02/2013, 12:22 PM

## 2013-10-02 NOTE — ED Notes (Signed)
Patient here from home with complaint of blood emesis starting this evening. 2 Episodes of blood emesis reported by EMS.

## 2013-10-02 NOTE — H&P (Signed)
PULMONARY  / CRITICAL CARE MEDICINE  Name: [name] MRN: [id number] DOB: 05-04-1951    ADMISSION DATE:  10/02/2013  PRIMARY SERVICE: PCCM  CHIEF COMPLAINT:   GI bleed  BRIEF PATIENT DESCRIPTION:  62 years old with PMH relevant for mental retardation, mutism, ESRD on HD, GI bleed, esophageal varices, CHF, seizures. Presents with  coffee ground emesis, melena and hypotension.  SIGNIFICANT EVENTS / STUDIES:    LINES / TUBES: - Left EJ IV - Right femoral HD catheter  CULTURES: Blood cultures sent.   ANTIBIOTICS: -Zosyn -Vancomycin  HISTORY OF PRESENT ILLNESS:   62 years old with PMH relevant for mental retardation, mutism, ESRD on HD, multiple episodes of GI bleed, the most recent at the end of November, esophageal varices, CHF, seizures. Presents with  coffee ground emesis x 2, melena and hypotension. His lactic acid at admission is 7.6. Fluid resuscitation was started in the ED. Of note, he has history of very complicated IV access. An EJ line was started in the ED. At the time of my examination the patient is awake and alert. His BP is 91/39 which as per his sister at the bedside is not to far from his baseline BP. The sister denies any other symptoms including cough, sputum production, abdominal pain, fever.   PAST MEDICAL HISTORY :  Past Medical History  Diagnosis Date  . Upper respiratory infection 12/12/2009  . Heart murmur, systolic 08/10/2009  . 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 Hypotension 05/26/2009  . Syncope 05/07/2009  . 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 Esophageal varices 11/07/2008  . Redness or discharge of eye 11/06/2008  . Skin tag 08/25/2008  . Gastric ulcer 10/09    antral, with h pylori positive  . Abdominal pain, acute, generalized 06/28/2008  . Congestive heart failure 03/06/2008  . Cellulitis and abscess of leg, except foot 03/06/2008  . Sinus tachycardia 03/06/2008  . Edema 03/06/2008  . Impacted cerumen of both ears 03/06/2008  .  Secondary hyperparathyroidism 02/02/2008  . Mute 02/02/2008  . Mental retardation 02/02/2008  . Hyperlipidemia 02/02/2008  . Anemia 02/02/2008  . ESRD (end stage renal disease)     TTS hemodialysis  . GERD (gastroesophageal reflux disease)   . Hypertension 02/02/2008    in history  . Seizures     "non in a while at home"   Past Surgical History  Procedure Laterality Date  . Left forearm graft      for HD  . Arteriovenous graft placement  11/22/10    Right thigh AVG  . Thrombectomy and revision of arterioventous (av) goretex  graft    . Thrombectomy and revision of arterioventous (av) goretex  graft  10/22/2012    Procedure: THROMBECTOMY AND REVISION OF ARTERIOVENTOUS (AV) GORETEX  GRAFT;  Surgeon: Larina Earthly, MD;  Location: [name] OR;  Service: Vascular;  Laterality: Right;  . Thrombectomy w/ embolectomy  11/10/2012    Procedure: THROMBECTOMY ARTERIOVENOUS GORE-TEX GRAFT;  Surgeon: Pryor Ochoa, MD;  Location: [facility] OR;  Service: Vascular;  Laterality: Right;  . Thrombectomy and revision of arterioventous (av) goretex  graft Right 12/08/2012    Procedure: THROMBECTOMY AND REVISION OF ARTERIOVENTOUS (AV) GORETEX  GRAFT right thigh;  Surgeon: Sherren Kerns, MD;  Location: [facility] OR;  Service: Vascular;  Laterality: Right;  Susie Cassette N/A 12/08/2012    Procedure: VENOGRAM;  Surgeon: Sherren Kerns, MD;  Location: [facility] OR;  Service: Vascular;  Laterality: N/A;  Intraoperative Central venogram  . Thrombectomy w/ embolectomy  Right 12/12/2012    Procedure: THROMBECTOMY ARTERIOVENOUS GORE-TEX GRAFT;  Surgeon: Nada Libman, MD;  Location: [facility] OR;  Service: Vascular;  Laterality: Right;  . Insertion of dialysis catheter Left 12/14/2012    Procedure: INSERTION OF DIALYSIS CATHETER;  Surgeon: Nada Libman, MD;  Location: [facility] OR;  Service: Vascular;  Laterality: Left;  . Insertion of dialysis catheter Right 01/13/2013    Procedure: INSERTION OF DIALYSIS CATHETER;  Surgeon: Nada Libman, MD;  Location:  [facility] OR;  Service: Vascular;  Laterality: Right;  . Removal of a dialysis catheter Left 01/13/2013    Procedure: REMOVAL OF A DIALYSIS CATHETER;  Surgeon: Nada Libman, MD;  Location: [facility] OR;  Service: Vascular;  Laterality: Left;  . Av fistula placement Left 02/11/2013    Procedure: INSERTION OF ARTERIOVENOUS (AV) GORE-TEX GRAFT THIGH;  Surgeon: Nada Libman, MD;  Location: [facility] OR;  Service: Vascular;  Laterality: Left;  using 6mm x 50cm Gore-Tex Vascular Graft  . Esophagogastroduodenoscopy N/A 02/16/2013    Procedure: ESOPHAGOGASTRODUODENOSCOPY (EGD);  Surgeon: Vertell Novak., MD;  Location: [facility] ENDOSCOPY;  Service: Endoscopy;  Laterality: N/A;  bedside  . Esophagogastroduodenoscopy N/A 09/14/2013    Procedure: ESOPHAGOGASTRODUODENOSCOPY (EGD);  Surgeon: Vertell Novak., MD;  Location: [facility] ENDOSCOPY;  Service: Endoscopy;  Laterality: N/A;  control of bleeding if needed   Prior to Admission medications   Medication Sig Start Date End Date Taking? Authorizing Provider  acetaminophen (TYLENOL) 325 MG tablet Take 2 tablets (650 mg total) by mouth every 6 (six) hours as needed for moderate pain. 08/29/13  Yes Manuela Schwartz, MD  atorvastatin (LIPITOR) 20 MG tablet Take 1 tablet (20 mg total) by mouth daily. 04/27/13  Yes Linward Headland, MD  clonazePAM (KLONOPIN) 0.5 MG tablet Take 0.5 tablets (0.25 mg total) by mouth 2 (two) times daily. 04/05/13  Yes Linward Headland, MD  folic acid-vitamin b complex-vitamin c-selenium-zinc (DIALYVITE) 3 MG TABS Take 1 tablet by mouth daily. 01/18/13  Yes Ulyess Mort, MD  midodrine (PROAMATINE) 10 MG tablet Take 15 mg by mouth 3 (three) times daily.    Yes Historical Provider, MD  pantoprazole (PROTONIX) 40 MG tablet Take 1 tablet (40 mg total) by mouth 2 (two) times daily. 09/17/13  Yes Annett Gula, MD  phenytoin (DILANTIN) 100 MG ER capsule Take 2 capsules (200 mg total) by mouth at bedtime. 03/02/13  Yes Nilda Riggs, NP  sevelamer carbonate  (RENVELA) 800 MG tablet Take 800 mg by mouth 3 (three) times daily with meals.   Yes Historical Provider, MD   No Known Allergies  FAMILY HISTORY:  No family history on file. SOCIAL HISTORY:  reports that he has never smoked. He has never used smokeless tobacco. He reports that he does not drink alcohol or use illicit drugs.  REVIEW OF SYSTEMS:  Unable to provide due to mental retardation and mutism.   SUBJECTIVE:   VITAL SIGNS: Temp:  [98.9 F (37.2 C)] 98.9 F (37.2 C) (12/14 0235) Pulse Rate:  [95-103] 99 (12/14 0515) Resp:  [17-43] 31 (12/14 0515) BP: (60-123)/(34-95) 91/39 mmHg (12/14 0515) SpO2:  [89 %-98 %] 98 % (12/14 0515) Weight:  [123 lb 7.3 oz (56 kg)] 123 lb 7.3 oz (56 kg) (12/14 0515) HEMODYNAMICS:   VENTILATOR SETTINGS:   INTAKE / OUTPUT: Intake/Output   None     PHYSICAL EXAMINATION: General: Pleasant male patient in no acute distress. Eyes: Anicteric sclerae. ENT: Oropharynx clear. Dry mucous membranes. No thrush  Lymph: No cervical, supraclavicular, or axillary lymphadenopathy. Heart: Normal S1, S2. Systolic murmur II/VI (known), no rubs, or gallops appreciated. No bruits, equal pulses. Lungs: Normal excursion, no dullness to percussion. Good air movement bilaterally, without wheezes or crackles. Normal upper airway sounds without evidence of stridor. Abdomen: Abdomen soft, non-tender and not distended, normoactive bowel sounds. No hepatosplenomegaly or masses. Musculoskeletal: No clubbing or synovitis. Skin: No rashes or lesions Neuro: No focal neurologic deficits.  LABS:  CBC  Recent Labs Lab 10/02/13 0305  WBC 4.0  HGB 8.7*  HCT 26.8*  PLT 175   Coag's  Recent Labs Lab 10/02/13 0305  APTT 29  INR 1.13   BMET  Recent Labs Lab 10/02/13 0305  NA 145  K 2.8*  CL 89*  CO2 36*  BUN 14  CREATININE 5.25*  GLUCOSE 249*   Electrolytes  Recent Labs Lab 10/02/13 0305  CALCIUM 9.6   Sepsis Markers  Recent Labs Lab  10/02/13 0305  LATICACIDVEN 7.6*   ABG No results found for this basename: PHART, PCO2ART, PO2ART,  in the last 168 hours Liver Enzymes  Recent Labs Lab 10/02/13 0305  AST 15  ALT 15  ALKPHOS 134*  BILITOT 0.2*  ALBUMIN 3.8   Cardiac Enzymes No results found for this basename: TROPONINI, PROBNP,  in the last 168 hours Glucose No results found for this basename: GLUCAP,  in the last 168 hours  Imaging Dg Chest Port 1 View  10/02/2013   CLINICAL DATA:  Hematemesis.  EXAM: PORTABLE CHEST - 1 VIEW  COMPARISON:  Chest radiograph performed 09/14/2013  FINDINGS: There is persistent elevation of the right hemidiaphragm. Patchy left-sided airspace opacity raises concern for pneumonia. No pleural effusion or pneumothorax is seen.  The cardiomediastinal silhouette is normal in size. No acute osseous abnormalities are identified.  IMPRESSION: Patchy left-sided airspace opacity, concerning for pneumonia.   Electronically Signed   By: Roanna Raider M.D.   On: 10/02/2013 04:52     CXR:  Questionable LLL infiltrate. Right lung is clear.   ASSESSMENT / PLAN:  PULMONARY A: 1) Questionable LLL infiltrate. Currently on RA P:   - Given history of indwelling catheters, complicated infections, hypotension and lactic acidosis we will cover empirically with antibiotics.  CARDIOVASCULAR A:  1) Hypotension and lactic acidosis. Acute blood loss anemia vs sepsis   P:  - Complicated vascular access. His BP is not to far from baseline. No bright red blood, Hgb only slightly down when compared to last CBC in November 9.2 -> 8.7. We will hold on central line placement for now. If persistent hypotension, worsening lactic acidosis or drop in his Hgb we will try central access. - Gentle IVF resuscitation given ESRD. - will follow lactate at 7:00 am  RENAL A:   1) ESRD on HD 2) Hypokalemia P:   - Will replenish K carefully and follow BMP  GASTROINTESTINAL A:   1) Upper GI bleed. Hgb 9.2 ->  8.7 P:   - Has left EJ acces and femoral HD catheter - Will get another peripheral IV - Serial CBC q 4 hrs - Will place central line if dropping Hgb, hypotension or worsening acidosis - GI consult called by ED physician. 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  P:  - Type and screen - No indication for transfusion - Will follow CBC as above  INFECTIOUS A:   1) No obvious source of infection. Still cannot  rule out sepsis in the setting of hypotension and lactic acidosis. History of cellulitis. P:   - Will cover empirically with vanc and Zosyn - Will follow cultures  ENDOCRINE A:   1) No issues  NEUROLOGIC A:   1) mental retardation and mutism 2) Mental status at baseline    I have personally obtained a history, examined the patient, evaluated laboratory and imaging results, formulated the assessment and plan and placed orders. CRITICAL CARE: Critical Care Time devoted to patient care services described in this note is 60 minutes.    Pulmonary and Critical Care Medicine [facility] Pager: [phone]  10/02/2013, 6:04 AM

## 2013-10-02 NOTE — Progress Notes (Signed)
eLink Physician-Brief Progress Note Patient Name: [name] DOB: November 15, 1950 MRN: [id number]  Date of Service  10/02/2013   HPI/Events of Note   GI Hemorrhage   Recent Labs Lab 10/02/13 0305 10/02/13 0700 10/02/13 1020 10/02/13 1500  HGB 8.7* 7.7* 7.3* 6.8*    eICU Interventions   Transfuse 1 unit PRBC   Intervention Category Major Interventions: Hemorrhage - evaluation and management  [name] 10/02/2013, 4:00 PM

## 2013-10-02 NOTE — Procedures (Signed)
Arterial Catheter Insertion Procedure Note [name] [id number] 03-Jan-1951  Procedure: Insertion of Arterial Catheter  Indications: Blood pressure monitoring  Procedure Details Consent: Risks of procedure as well as the alternatives and risks of each were explained to the (patient/caregiver).  Consent for procedure obtained. Time Out: Verified patient identification, verified procedure, site/side was marked, verified correct patient position, special equipment/implants available, medications/allergies/relevent history reviewed, required imaging and test results available.  Performed  Maximum sterile technique was used including antiseptics, gloves, hand hygiene and sheet. Skin prep: Chlorhexidine; local anesthetic administered 20 gauge catheter was inserted into right radial artery using the Seldinger technique.  Evaluation Blood flow good; BP tracing good. Complications: No apparent complications.   [name] 10/02/2013

## 2013-10-02 NOTE — ED Notes (Signed)
Restraints not applied to patient, family at bedside, Patient currently not making any purposeful movements to displace IV access.

## 2013-10-03 ENCOUNTER — Encounter (HOSPITAL_COMMUNITY): Payer: Self-pay | Admitting: *Deleted

## 2013-10-03 ENCOUNTER — Encounter (HOSPITAL_COMMUNITY)
Admission: EM | Disposition: A | Discharge status: Home / Self Care | Payer: Self-pay | Source: Home / Self Care | Attending: Internal Medicine

## 2013-10-03 DIAGNOSIS — I959 Hypotension, unspecified: Secondary | ICD-10-CM

## 2013-10-03 DIAGNOSIS — N186 End stage renal disease: Secondary | ICD-10-CM

## 2013-10-03 DIAGNOSIS — K922 Gastrointestinal hemorrhage, unspecified: Secondary | ICD-10-CM

## 2013-10-03 HISTORY — PX: ESOPHAGOGASTRODUODENOSCOPY: SHX5428

## 2013-10-03 LAB — CBC
HCT: 25.4 % — ABNORMAL LOW (ref 39.0–52.0)
HCT: 24.4 % — ABNORMAL LOW (ref 39.0–52.0)
HCT: 17.8 % — ABNORMAL LOW (ref 39.0–52.0)
Hemoglobin: 8.7 g/dL — ABNORMAL LOW (ref 13.0–17.0)
Hemoglobin: 8.7 g/dL — ABNORMAL LOW (ref 13.0–17.0)
Hemoglobin: 8.4 g/dL — ABNORMAL LOW (ref 13.0–17.0)
MCH: 31.8 pg (ref 26.0–34.0)
MCH: 31.7 pg (ref 26.0–34.0)
MCH: 32.6 pg (ref 26.0–34.0)
MCHC: 34.4 g/dL (ref 30.0–36.0)
MCV: 92.3 fL (ref 78.0–100.0)
MCV: 92.1 fL (ref 78.0–100.0)
MCV: 92.2 fL (ref 78.0–100.0)
Platelets: 95 10*3/uL — ABNORMAL LOW (ref 150–400)
RBC: 2.74 MIL/uL — ABNORMAL LOW (ref 4.22–5.81)
RBC: 2.65 MIL/uL — ABNORMAL LOW (ref 4.22–5.81)
RBC: 1.93 MIL/uL — ABNORMAL LOW (ref 4.22–5.81)
RDW: 16.4 % — ABNORMAL HIGH (ref 11.5–15.5)
RDW: 16 % — ABNORMAL HIGH (ref 11.5–15.5)
WBC: 11.6 10*3/uL — ABNORMAL HIGH (ref 4.0–10.5)
WBC: 5.6 10*3/uL (ref 4.0–10.5)

## 2013-10-03 LAB — RENAL FUNCTION PANEL
Albumin: 2.6 g/dL — ABNORMAL LOW (ref 3.5–5.2)
CO2: 27 mEq/L (ref 19–32)
GFR calc Af Amer: 9 mL/min — ABNORMAL LOW (ref >90)
Glucose, Bld: 93 mg/dL (ref 70–99)
Phosphorus: 5.3 mg/dL — ABNORMAL HIGH (ref 2.3–4.6)
Potassium: 3.6 mEq/L (ref 3.5–5.1)
Sodium: 138 mEq/L (ref 135–145)

## 2013-10-03 LAB — LACTIC ACID, PLASMA: Lactic Acid, Venous: 0.8 mmol/L (ref 0.5–2.2)

## 2013-10-03 LAB — PREPARE RBC (CROSSMATCH)

## 2013-10-03 SURGERY — EGD (ESOPHAGOGASTRODUODENOSCOPY)
Anesthesia: Moderate Sedation

## 2013-10-03 MED ORDER — PENTAFLUOROPROP-TETRAFLUOROETH EX AERO: 1.0000 "application " | INHALATION_SPRAY | CUTANEOUS | Status: DC | PRN

## 2013-10-03 MED ORDER — ALTEPLASE 2 MG IJ SOLR
2.0000 mg | Freq: Once | INTRAMUSCULAR | Status: AC | PRN
  Filled 2013-10-03: qty 2

## 2013-10-03 MED ORDER — PHENYLEPHRINE HCL 10 MG/ML IJ SOLN
30.0000 ug/min | INTRAVENOUS | Status: DC
  Filled 2013-10-03: qty 4

## 2013-10-03 MED ORDER — MIDAZOLAM HCL 5 MG/ML IJ SOLN
INTRAMUSCULAR | Status: AC
  Filled 2013-10-03: qty 2

## 2013-10-03 MED ORDER — SODIUM CHLORIDE 0.9 % IV SOLN: INTRAVENOUS | Status: DC

## 2013-10-03 MED ORDER — SODIUM CHLORIDE 0.9 % IV SOLN: 100.0000 mL | INTRAVENOUS | Status: DC | PRN

## 2013-10-03 MED ORDER — MIDAZOLAM HCL 10 MG/2ML IJ SOLN
INTRAMUSCULAR | Status: DC | PRN
  Administered 2013-10-03 (×2): 2 mg via INTRAVENOUS

## 2013-10-03 MED ORDER — HEPARIN SODIUM (PORCINE) 1000 UNIT/ML DIALYSIS
1000.0000 [IU] | INTRAMUSCULAR | Status: DC | PRN
  Filled 2013-10-03: qty 1

## 2013-10-03 MED ORDER — SODIUM CHLORIDE 0.9 % IV SOLN
200.0000 mg | Freq: Every day | INTRAVENOUS | Status: DC
  Administered 2013-10-03 – 2013-10-05 (×3): 200 mg via INTRAVENOUS
  Filled 2013-10-03 (×5): qty 4

## 2013-10-03 MED ORDER — SODIUM CHLORIDE 0.9 % IV SOLN
INTRAVENOUS | Status: DC
  Administered 2013-10-03: 09:00:00 via INTRAVENOUS

## 2013-10-03 MED ORDER — NEPRO/CARBSTEADY PO LIQD
237.0000 mL | ORAL | Status: DC | PRN
  Filled 2013-10-03: qty 237

## 2013-10-03 MED ORDER — LIDOCAINE-PRILOCAINE 2.5-2.5 % EX CREA
1.0000 "application " | TOPICAL_CREAM | CUTANEOUS | Status: DC | PRN
  Filled 2013-10-03: qty 5

## 2013-10-03 MED ORDER — LIDOCAINE HCL (PF) 1 % IJ SOLN: 5.0000 mL | INTRAMUSCULAR | Status: DC | PRN

## 2013-10-03 MED ORDER — FENTANYL CITRATE 0.05 MG/ML IJ SOLN
INTRAMUSCULAR | Status: AC
  Filled 2013-10-03: qty 2

## 2013-10-03 NOTE — Progress Notes (Signed)
[facility] Progress Note    Subjective: per RN had CG emesis last night x 1, had EGD this am which showed mid esoph varices non bleeding, no other findings, no Rx , follow clinically per Dr Elnoria Howard   Exam  Blood pressure 99/51, pulse 98, temperature 97.2 F (36.2 C), temperature source Oral, resp. rate 20, height 5' 4.96" (1.65 m), weight 57.4 kg (126 lb 8.7 oz), SpO2 100.00%. Gen: awake, tracking, no distress Neck: +jvd, mild-mod  Lungs: clear bilat but poor effort, not cooperative  Heart: regular, no rub, M or gallop  Abd: soft, nt, nd, no mass or tenderness, +BS Ext: muscle wasting, no LE or UE edema Neuro: alert, nf, tracks, mute, does not follow commands Access: L thigh AVG patent  Dialysis: [facility] TTS  3:45hrs   2K/2Ca bath   56kg   Heparin none   L thigh AVG   400/800  Hectorol 12   Epogen 15000   Venofer 50 weekly  Recent labs: Hgb 8.6, Tsat 34%, P 4.9, PTH 617, Alb 4.3   Assessment/Plan:  1. Hematemesis: nonbleeding esoph varices per EGD; Hb stable, per GI and primary 2. Anemia of CKD+ABLA: transfused one unit for lowest Hb 6.8, max aranesp 200/wk, maint IV fe 3. Hypotension, chronic: baseline BP 90-110 4. ESRD: HD tomorrow 5. Volume: up 1kg, UF as tol with HD tomorrow 6. MBD/CKD: Ca and Phos controlled. Continue low Ca bath and Vit D. Resume Renvela 1 AC once eating. 7. Nutrition - NPO for now. Renal diet and multivitamin once eating. 8. HD access - Uses L thigh AVG/ limited access options. Steady decline in access flows from > 1000 in September to now in the 600's. Shuntogram ordered outpatient, but must be done under sedation due to his MR and anxiety. Consider coordinating during this admit once condition stabilizes. 9. 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 Goals of care - Family desires full code under any circumstances per notes   Vinson Moselle MD  pager [phone]    cell [phone]  10/03/2013, 12:07 PM   Recent Labs Lab 10/02/13 0700 10/02/13 1535  10/03/13 0404  NA 147* 141 138  K 3.0* 3.8 3.6  CL 95* 98 97  CO2 35* 32 27  GLUCOSE 157* 137* 93  BUN 17 31* 42*  CREATININE 5.27* 5.46* 6.72*  CALCIUM 8.4 8.6 9.4  PHOS  --  4.6 5.3*    Recent Labs Lab 10/02/13 0305 10/02/13 1535 10/03/13 0404  AST 15  --   --   ALT 15  --   --   ALKPHOS 134*  --   --   BILITOT 0.2*  --   --   PROT 7.2  --   --   ALBUMIN 3.8 2.7* 2.6*    Recent Labs Lab 10/02/13 0305  10/03/13 0302 10/03/13 0639 10/03/13 1030  WBC 4.0  < > 11.6* 10.1 9.2  NEUTROABS 2.7  --   --   --   --   HGB 8.7*  < > 8.7* 8.7* 8.4*  HCT 26.8*  < > 25.4* 25.3* 24.4*  MCV 98.9  < > 92.0 92.3 92.1  PLT 175  < > 135* 140* 120*  < > = values in this interval not displayed. Melene Muller ON 10/04/2013] darbepoetin (ARANESP) injection - DIALYSIS  200 mcg Intravenous Q Tue-HD   . sodium chloride    . sodium chloride 20 mL/hr at 10/03/13 0900  . sodium chloride    . pantoprozole (PROTONIX) infusion  8 mg/hr (10/03/13 1100)  . phenylephrine (NEO-SYNEPHRINE) Adult infusion 50 mcg/min (10/03/13 1100)   sodium chloride, sodium chloride, sodium chloride, alteplase, feeding supplement (NEPRO CARB STEADY), heparin, lidocaine (PF), lidocaine-prilocaine, ondansetron (ZOFRAN) IV, pentafluoroprop-tetrafluoroeth

## 2013-10-03 NOTE — Progress Notes (Signed)
PULMONARY  / CRITICAL CARE MEDICINE  Name: [name] MRN: [id number] DOB: 06/21/1951    ADMISSION DATE:  10/02/2013  PRIMARY SERVICE: PCCM  CHIEF COMPLAINT:   GI bleed  BRIEF PATIENT DESCRIPTION:  62 years old with PMH relevant for mental retardation, mutism, ESRD on HD, GI bleed, esophageal varices, CHF, seizures. Presents with  coffee ground emesis, melena and hypotension.  SIGNIFICANT EVENTS / STUDIES:  12/15 EGD>>> upper esophageal varices, ?as source of bleeding, no evidence on EGD  LINES / TUBES: PIV >> unable to place any other central lines  CULTURES: BC x2 12/14>>>   ANTIBIOTICS: Zosyn 12/14>>>12/14 Vancomycin 12/14>>>12/14  SUBJECTIVE:  EGD this am.  No further bleeding noted.   VITAL SIGNS: Temp:  [97.3 F (36.3 C)-98.7 F (37.1 C)] 98.3 F (36.8 C) (12/15 0400) Pulse Rate:  [25-132] 79 (12/15 0915) Resp:  [0-33] 16 (12/15 0915) BP: (79-124)/(32-72) 106/50 mmHg (12/15 0900) SpO2:  [42 %-100 %] 100 % (12/15 0915) Weight:  [126 lb 8.7 oz (57.4 kg)] 126 lb 8.7 oz (57.4 kg) (12/15 0500) INTAKE / OUTPUT: Intake/Output     12/14 0701 - 12/15 0700 12/15 0701 - 12/16 0700   I.V. (mL/kg) 3061 (53.3) 108.1 (1.9)   Blood 350    IV Piggyback 100    Total Intake(mL/kg) 3511 (61.2) 108.1 (1.9)   Emesis/NG output 250    Total Output 250     Net +3261 +108.1          PHYSICAL EXAMINATION: General: Pleasant male patient in no acute distress. Eyes: Anicteric sclerae. ENT: mm dry, no JVD  Heart: Normal S1, S2. Systolic murmur II/VI (known),  Lungs: resps even non labored on [place], essentially clear  Abdomen: Abdomen soft, non-tender and not distended, normoactive bowel sounds. No hepatosplenomegaly or masses. Musculoskeletal: No clubbing or synovitis. Skin: No rashes or lesions, LU thigh graft Neuro: No focal neurologic deficits. Mute  LABS:  CBC  Recent Labs Lab 10/02/13 2304 10/03/13 0302 10/03/13 0639  WBC 9.9 11.6* 10.1  HGB 8.2* 8.7* 8.7*   HCT 23.0* 25.4* 25.3*  PLT 162 135* 140*   Coag's  Recent Labs Lab 10/02/13 0305  APTT 29  INR 1.13   BMET  Recent Labs Lab 10/02/13 0700 10/02/13 1535 10/03/13 0404  NA 147* 141 138  K 3.0* 3.8 3.6  CL 95* 98 97  CO2 35* 32 27  BUN 17 31* 42*  CREATININE 5.27* 5.46* 6.72*  GLUCOSE 157* 137* 93   Electrolytes  Recent Labs Lab 10/02/13 0700 10/02/13 1535 10/03/13 0404  CALCIUM 8.4 8.6 9.4  PHOS  --  4.6 5.3*   Sepsis Markers  Recent Labs Lab 10/02/13 0305 10/02/13 0700  LATICACIDVEN 7.6* 5.3*   Liver Enzymes  Recent Labs Lab 10/02/13 0305 10/02/13 1535 10/03/13 0404  AST 15  --   --   ALT 15  --   --   ALKPHOS 134*  --   --   BILITOT 0.2*  --   --   ALBUMIN 3.8 2.7* 2.6*   Glucose  Recent Labs Lab 10/02/13 0726  GLUCAP 147*    Imaging Dg Chest Port 1 View  10/02/2013   CLINICAL DATA:  Hematemesis.  EXAM: PORTABLE CHEST - 1 VIEW  COMPARISON:  Chest radiograph performed 09/14/2013  FINDINGS: There is persistent elevation of the right hemidiaphragm. Patchy left-sided airspace opacity raises concern for pneumonia. No pleural effusion or pneumothorax is seen.  The cardiomediastinal silhouette is normal in size. No acute osseous  abnormalities are identified.  IMPRESSION: Patchy left-sided airspace opacity, concerning for pneumonia.   Electronically Signed   By: Roanna Raider M.D.   On: 10/02/2013 04:52    ASSESSMENT / PLAN:  PULMONARY A: Questionable LLL infiltrate P:   O2 as needed  F/u CXR as needed Hold abx for now  CARDIOVASCULAR A:  Hypotension - r/t UGI bleed >> baseline systolic blood pressure 75 to 90. lactic acidosis >> resolved. P:  No options for vascular access, have d/w vascular and IR who reviewed recent venogram Gentle volume  Wean pressors as able -- would change goal to SBP 85 - likely baseline based on outpt records   RENAL A:   ESRD on HD Hypokalemia P:   Replete K as needed  Renal following  Planned HD  12/16 per usual TTS schedule   GASTROINTESTINAL A:   Upper GI bleed. 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  P:  Trend cbc  Transfuse for hgb<7  INFECTIOUS A:   No obvious source of infection. P:   Monitor clinically  ENDOCRINE A:   No issues  NEUROLOGIC A:    mental retardation and mutism  Mental status at baseline   WHITEHEART,KATHRYN, NP 10/03/2013  10:31 AM Pager: [phone] or [phone]  Reviewed above, examined pt, and agree with assessment/plan.  Will adjust parameters to wean off pressors.    Coralyn Helling, MD [facility] Pulmonary/Critical Care 10/03/2013, 10:56 AM Pager:  [phone] After 3pm call: [phone]

## 2013-10-03 NOTE — Op Note (Signed)
[facility] [address] [place], [address]   OPERATIVE PROCEDURE REPORT  PATIENT: [name], [name]  MR#: [id number] BIRTHDATE: 1951/10/03  GENDER: Male ENDOSCOPIST: Jeani Hawking, MD ASSISTANT:   Nilsa Nutting, Endo Technician, Dwain Sarna, RN CGRN, and Oletha Blend, technician PROCEDURE DATE: 10/03/2013 PROCEDURE:   EGD, diagnostic ASA CLASS:   Class III INDICATIONS:Hematemesis. MEDICATIONS: Versed 4 mg IV TOPICAL ANESTHETIC:   none  DESCRIPTION OF PROCEDURE:   After the risks benefits and alternatives of the procedure were thoroughly explained, informed consent was obtained.  The Pentax Gastroscope [id number]  endoscope was introduced through the mouth  and advanced to the second portion of the duodenum Without limitations.      The instrument was slowly withdrawn as the mucosa was fully examined.      FINDINGS: In the mid to proximal esophagus there was evidence of varices.  Four columns were identified, but no evidence of bleeding.  The gastric lumen did not exhibit overt portal HTN gastropathy.  No evidence of varices in the lower esohagus.  No bleeding sites in the duodenum.          The scope was then withdrawn from the patient and the procedure terminated.  COMPLICATIONS: There were no complications. IMPRESSION: 1) Mid to upper small esophagus.  RECOMMENDATIONS: 1) Very difficult situation.  I am uncertain if the upper esophageal varices are the source of bleeding.  I will follow closely and make furthe recommendations as the clinical status progresses.  _______________________________ eSignedJeani Hawking, MD 10/03/2013 8:42 AM

## 2013-10-03 NOTE — Progress Notes (Signed)
eLink Physician-Brief Progress Note Patient Name: [name] DOB: August 26, 1951 MRN: [id number]  Date of Service  10/03/2013   HPI/Events of Note   Hg 6.3.  eICU Interventions  Transfuse 1 unit pRBC and Dr. Marchelle Gearing to evaluate for line placement.      [name],[name] 10/03/2013, 7:23 PM

## 2013-10-03 NOTE — Progress Notes (Signed)
MEDICATION RELATED CONSULT NOTE - INITIAL   Pharmacy Consult for Dilantin Indication: h/o seizures  No Known Allergies  Patient Measurements: Height: 5' 4.96" (165 cm) Weight: 126 lb 8.7 oz (57.4 kg) IBW/kg (Calculated) : 61.41 Adjusted Body Weight:   Vital Signs: Temp: 97.2 F (36.2 C) (12/15 1143) Temp src: Oral (12/15 1143) BP: 99/57 mmHg (12/15 1200) Pulse Rate: 104 (12/15 1215) Intake/Output from previous day: 12/14 0701 - 12/15 0700 In: 3511 [I.V.:3061; Blood:350; IV Piggyback:100] Out: 250 [Emesis/NG output:250] Intake/Output from this shift: Total I/O In: 354.4 [I.V.:354.4] Out: -   Labs:  Recent Labs  10/02/13 0305 10/02/13 0700  10/02/13 1535  10/03/13 0302 10/03/13 0404 10/03/13 0639 10/03/13 1030  WBC 4.0 6.5  < >  --   < > 11.6*  --  10.1 9.2  HGB 8.7* 7.7*  < >  --   < > 8.7*  --  8.7* 8.4*  HCT 26.8* 23.1*  < >  --   < > 25.4*  --  25.3* 24.4*  PLT 175 133*  < >  --   < > 135*  --  140* 120*  APTT 29  --   --   --   --   --   --   --   --   CREATININE 5.25* 5.27*  --  5.46*  --   --  6.72*  --   --   PHOS  --   --   --  4.6  --   --  5.3*  --   --   ALBUMIN 3.8  --   --  2.7*  --   --  2.6*  --   --   PROT 7.2  --   --   --   --   --   --   --   --   AST 15  --   --   --   --   --   --   --   --   ALT 15  --   --   --   --   --   --   --   --   ALKPHOS 134*  --   --   --   --   --   --   --   --   BILITOT 0.2*  --   --   --   --   --   --   --   --   < > = values in this interval not displayed. Estimated Creatinine Clearance: 9 ml/min (by C-G formula based on Cr of 6.72).   Microbiology: Recent Results (from the past 720 hour(s))  MRSA PCR SCREENING     Status: None   Collection Time    09/14/13 12:08 PM      Result Value Range Status   MRSA by PCR NEGATIVE  NEGATIVE Final   Comment:            The GeneXpert MRSA Assay (FDA     approved for NASAL specimens     only), is one component of a     comprehensive MRSA colonization   surveillance program. It is not     intended to diagnose MRSA     infection nor to guide or     monitor treatment for     MRSA infections.  MRSA PCR SCREENING     Status: None   Collection Time    10/02/13  7:16 AM  Result Value Range Status   MRSA by PCR NEGATIVE  NEGATIVE Final   Comment:            The GeneXpert MRSA Assay (FDA     approved for NASAL specimens     only), is one component of a     comprehensive MRSA colonization     surveillance program. It is not     intended to diagnose MRSA     infection nor to guide or     monitor treatment for     MRSA infections.  CULTURE, BLOOD (ROUTINE X 2)     Status: None   Collection Time    10/02/13 10:00 AM      Result Value Range Status   Specimen Description BLOOD RIGHT ARM   Final   Special Requests BOTTLES DRAWN AEROBIC AND ANAEROBIC 10CC   Final   Culture  Setup Time     Final   Value: 10/02/2013 17:04     Performed at [facility]   Culture     Final   Value:        BLOOD CULTURE RECEIVED NO GROWTH TO DATE CULTURE WILL BE HELD FOR 5 DAYS BEFORE ISSUING A FINAL NEGATIVE REPORT     Performed at [facility]   Report Status PENDING   Incomplete  CULTURE, BLOOD (ROUTINE X 2)     Status: None   Collection Time    10/02/13 10:28 AM      Result Value Range Status   Specimen Description BLOOD LEFT HAND   Final   Special Requests BOTTLES DRAWN AEROBIC AND ANAEROBIC 10CC   Final   Culture  Setup Time     Final   Value: 10/02/2013 17:04     Performed at [facility]   Culture     Final   Value:        BLOOD CULTURE RECEIVED NO GROWTH TO DATE CULTURE WILL BE HELD FOR 5 DAYS BEFORE ISSUING A FINAL NEGATIVE REPORT     Performed at [facility]   Report Status PENDING   Incomplete    Medical History: Past Medical History  Diagnosis Date  . Heart murmur, systolic 08/10/2009  . Syncope 05/07/2009  . 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 Esophageal varices 11/07/2008  . Gastric ulcer  10/09    antral, with h pylori positive  . Congestive heart failure 03/06/2008  . Cellulitis and abscess of leg, except foot 03/06/2008  . Secondary hyperparathyroidism 02/02/2008  . Mute 02/02/2008  . Mental retardation 02/02/2008  . Hyperlipidemia 02/02/2008  . Anemia 02/02/2008  . ESRD (end stage renal disease)     TTS hemodialysis  . GERD (gastroesophageal reflux disease)   . Hypertension 02/02/2008    in history  . Seizures     "non in a while at home"   Assessment:  Hematemesis, melena, hypotension  Neurology: Mental retardation, mute, h/o seizures. Patient has missed 1 day of Dilantin (according to med rec last dose 12/13). No current seizure activity noted. Will resume home Dilantin dosing and check level in 5 days.   Goal of Therapy:  Dilantin level 10-20  Plan:  Resume home Dilantin 200mg  IV hs   Kjell Brannen S. Merilynn Finland, PharmD, BCPS Clinical Staff Pharmacist Pager [phone]  Misty Stanley Stillinger 10/03/2013,1:48 PM

## 2013-10-03 NOTE — Interval H&P Note (Signed)
History and Physical Interval Note:  10/03/2013 8:01 AM  [name]  has presented today for surgery, with the diagnosis of Hematemesis  The various methods of treatment have been discussed with the patient and family. After consideration of risks, benefits and other options for treatment, the patient has consented to  Procedure(s) with comments: ESOPHAGOGASTRODUODENOSCOPY (EGD) (N/A) - Bedside as a surgical intervention .  The patient's history has been reviewed, patient examined, no change in status, stable for surgery.  I have reviewed the patient's chart and labs.  Questions were answered to the patient's satisfaction.     [name] D

## 2013-10-03 NOTE — H&P (View-Only) (Signed)
eLink Physician-Brief Progress Note Patient Name: [name] DOB: 09/30/1951 MRN: [id number]  Date of Service  10/02/2013   HPI/Events of Note   GI Hemorrhage   Recent Labs Lab 10/02/13 0305 10/02/13 0700 10/02/13 1020 10/02/13 1500  HGB 8.7* 7.7* 7.3* 6.8*    eICU Interventions   Transfuse 1 unit PRBC   Intervention Category Major Interventions: Hemorrhage - evaluation and management  Autry Prust 10/02/2013, 4:00 PM 

## 2013-10-04 ENCOUNTER — Encounter (HOSPITAL_COMMUNITY): Payer: Self-pay | Admitting: Gastroenterology

## 2013-10-04 LAB — CBC
HCT: 19.5 % — ABNORMAL LOW (ref 39.0–52.0)
Hemoglobin: 6.9 g/dL — CL (ref 13.0–17.0)
Hemoglobin: 9.2 g/dL — ABNORMAL LOW (ref 13.0–17.0)
MCH: 31.8 pg (ref 26.0–34.0)
MCH: 31.7 pg (ref 26.0–34.0)
MCHC: 34.9 g/dL (ref 30.0–36.0)
MCHC: 35.4 g/dL (ref 30.0–36.0)
Platelets: 132 10*3/uL — ABNORMAL LOW (ref 150–400)
Platelets: 123 10*3/uL — ABNORMAL LOW (ref 150–400)
RBC: 2.83 MIL/uL — ABNORMAL LOW (ref 4.22–5.81)
RBC: 2.17 MIL/uL — ABNORMAL LOW (ref 4.22–5.81)
RBC: 2.9 MIL/uL — ABNORMAL LOW (ref 4.22–5.81)
RDW: 15.8 % — ABNORMAL HIGH (ref 11.5–15.5)
RDW: 15.7 % — ABNORMAL HIGH (ref 11.5–15.5)
RDW: 15.3 % (ref 11.5–15.5)
WBC: 6.9 10*3/uL (ref 4.0–10.5)
WBC: 4.3 10*3/uL (ref 4.0–10.5)

## 2013-10-04 LAB — RENAL FUNCTION PANEL
Albumin: 2.7 g/dL — ABNORMAL LOW (ref 3.5–5.2)
BUN: 51 mg/dL — ABNORMAL HIGH (ref 6–23)
BUN: 17 mg/dL (ref 6–23)
CO2: 22 mEq/L (ref 19–32)
CO2: 25 mEq/L (ref 19–32)
Calcium: 8.9 mg/dL (ref 8.4–10.5)
Chloride: 99 mEq/L (ref 96–112)
GFR calc Af Amer: 7 mL/min — ABNORMAL LOW (ref >90)
GFR calc Af Amer: 17 mL/min — ABNORMAL LOW (ref >90)
GFR calc non Af Amer: 6 mL/min — ABNORMAL LOW (ref >90)
GFR calc non Af Amer: 14 mL/min — ABNORMAL LOW (ref >90)
Phosphorus: 5.7 mg/dL — ABNORMAL HIGH (ref 2.3–4.6)
Phosphorus: 3.7 mg/dL (ref 2.3–4.6)
Potassium: 3.9 mEq/L (ref 3.5–5.1)
Potassium: 3.5 mEq/L (ref 3.5–5.1)
Sodium: 136 mEq/L (ref 135–145)
Sodium: 139 mEq/L (ref 135–145)

## 2013-10-04 LAB — BASIC METABOLIC PANEL
CO2: 24 mEq/L (ref 19–32)
Calcium: 8.9 mg/dL (ref 8.4–10.5)
Potassium: 3.8 mEq/L (ref 3.5–5.1)
Sodium: 137 mEq/L (ref 135–145)

## 2013-10-04 LAB — TYPE AND SCREEN
ABO/RH(D): B POS
Unit division: 0

## 2013-10-04 MED ORDER — SODIUM CHLORIDE 0.9 % IV SOLN
100.0000 mL | INTRAVENOUS | Status: DC | PRN
  Administered 2013-10-05: 100 mL via INTRAVENOUS

## 2013-10-04 MED ORDER — SODIUM CHLORIDE 0.9 % IV BOLUS (SEPSIS)
500.0000 mL | Freq: Once | INTRAVENOUS | Status: AC
  Administered 2013-10-04: 500 mL via INTRAVENOUS

## 2013-10-04 MED ORDER — PENTAFLUOROPROP-TETRAFLUOROETH EX AERO: 1.0000 "application " | INHALATION_SPRAY | CUTANEOUS | Status: DC | PRN

## 2013-10-04 MED ORDER — ALTEPLASE 2 MG IJ SOLR
2.0000 mg | Freq: Once | INTRAMUSCULAR | Status: AC | PRN
  Filled 2013-10-04: qty 2

## 2013-10-04 MED ORDER — NEPRO/CARBSTEADY PO LIQD
237.0000 mL | ORAL | Status: DC | PRN
  Filled 2013-10-04: qty 237

## 2013-10-04 MED ORDER — HEPARIN SODIUM (PORCINE) 1000 UNIT/ML DIALYSIS
1000.0000 [IU] | INTRAMUSCULAR | Status: DC | PRN
  Filled 2013-10-04: qty 1

## 2013-10-04 MED ORDER — LIDOCAINE HCL (PF) 1 % IJ SOLN: 5.0000 mL | INTRAMUSCULAR | Status: DC | PRN

## 2013-10-04 MED ORDER — DARBEPOETIN ALFA-POLYSORBATE 200 MCG/0.4ML IJ SOLN
INTRAMUSCULAR | Status: AC
  Filled 2013-10-04: qty 0.4

## 2013-10-04 MED ORDER — LIDOCAINE-PRILOCAINE 2.5-2.5 % EX CREA
1.0000 "application " | TOPICAL_CREAM | CUTANEOUS | Status: DC | PRN
  Filled 2013-10-04: qty 5

## 2013-10-04 NOTE — Procedures (Signed)
I was present at this dialysis session, have reviewed the session itself and made  appropriate changes  Vinson Moselle MD (pgr) [phone]    [phone] 10/04/2013, 8:26 AM

## 2013-10-04 NOTE — Progress Notes (Signed)
Pt TX from 2300 via HD this pm. BP stable; did dip; called attending order for bolus; admin.   Drew labs from [employer] post bolus to confirm CBC. (Earlier specimen sent was suspect for dilution). Will continue to monitor.

## 2013-10-04 NOTE — Progress Notes (Signed)
Subjective: No further bleeding overnight.  HGB increased with blood transfusions and pressors discontinued.  Objective: Vital signs in last 24 hours: Temp:  [97.2 F (36.2 C)-98.1 F (36.7 C)] 98 F (36.7 C) (12/16 0400) Pulse Rate:  [63-149] 98 (12/16 0600) Resp:  [0-29] 23 (12/16 0600) BP: (79-130)/(43-72) 104/43 mmHg (12/16 0600) SpO2:  [89 %-100 %] 100 % (12/16 0600) Last BM Date: 10/01/13  Intake/Output from previous day: 12/15 0701 - 12/16 0700 In: 1399.9 [I.V.:930.9; Blood:365; IV Piggyback:104] Out: 0  Intake/Output this shift:    General appearance: alert and no distress GI: soft, non-tender; bowel sounds normal; no masses,  no organomegaly  Lab Results:  Recent Labs  10/03/13 1030 10/03/13 1830 10/04/13 0230  WBC 9.2 5.6 6.9  HGB 8.4* 6.3* 9.0*  HCT 24.4* 17.8* 25.8*  PLT 120* 95* 132*   BMET  Recent Labs  10/03/13 0404 10/04/13 0020 10/04/13 0500  NA 138 136 137  K 3.6 3.9 3.8  CL 97 96 98  CO2 27 22 24   GLUCOSE 93 101* 93  BUN 42* 51* 53*  CREATININE 6.72* 8.27* 8.55*  CALCIUM 9.4 9.1 8.9   LFT  Recent Labs  10/02/13 0305  10/04/13 0020  PROT 7.2  --   --   ALBUMIN 3.8  < > 2.7*  AST 15  --   --   ALT 15  --   --   ALKPHOS 134*  --   --   BILITOT 0.2*  --   --   < > = values in this interval not displayed. PT/INR  Recent Labs  10/02/13 0305  LABPROT 14.3  INR 1.13   Hepatitis Panel No results found for this basename: HEPBSAG, HCVAB, HEPAIGM, HEPBIGM,  in the last 72 hours C-Diff No results found for this basename: CDIFFTOX,  in the last 72 hours Fecal Lactopherrin No results found for this basename: FECLLACTOFRN,  in the last 72 hours  Studies/Results: No results found.  Medications:  Scheduled: . darbepoetin (ARANESP) injection - DIALYSIS  200 mcg Intravenous Q Tue-HD  . phenytoin (DILANTIN) IV  200 mg Intravenous QHS   Continuous: . sodium chloride    . sodium chloride Stopped (10/04/13 0030)  . sodium chloride     . pantoprozole (PROTONIX) infusion 8 mg/hr (10/03/13 2322)  . phenylephrine (NEO-SYNEPHRINE) Adult infusion Stopped (10/03/13 1939)    Assessment/Plan: 1) Hematemesis. 2) Anemia.   This is a frustrating case as the source of bleeding cannot be identified.  He was noted to have mid to upper esophageal varices versus normal variant enlarged blood vessels.  The ultrasound on 09/14/2013 was not definite for any evidence of cirrhosis.  The gastric lumen did not demonstrate any evidence of portal HTN gastropathy and there were not distal esophageal varices.  He does have thrombocytopenia to suggest cirrhosis.  Plan: 1) Monitor HGB. 2) Transfuse as necessary. 3) I will discuss with Radiology if there is any other alternatives to image for varices in light of his renal failure.  LOS: 2 days   Kennedee Kitzmiller D 10/04/2013, 7:29 AM

## 2013-10-04 NOTE — Plan of Care (Signed)
Problem: Phase I Progression Outcomes Goal: Voiding-avoid urinary catheter unless indicated Outcome: Not Applicable Date Met:  10/04/13 Pt anuric, gets HD

## 2013-10-04 NOTE — Progress Notes (Signed)
Pt to HD via Bed

## 2013-10-04 NOTE — Progress Notes (Signed)
PULMONARY  / CRITICAL CARE MEDICINE  Name: [name] MRN: [id number] DOB: [birth year]    ADMISSION DATE:  10/02/2013  PRIMARY SERVICE: PCCM  CHIEF COMPLAINT:   GI bleed  BRIEF PATIENT DESCRIPTION:  62 years old with PMH relevant for mental retardation, mutism, ESRD on HD, GI bleed, esophageal varices, CHF, seizures. Presents with  coffee ground emesis, melena and hypotension.  SIGNIFICANT EVENTS / STUDIES:  12/15 EGD>>> upper esophageal varices, ?as source of bleeding, no evidence on EGD  LINES / TUBES: PIV >> unable to place any other central lines  CULTURES: BC x2 12/14>>>   ANTIBIOTICS: Zosyn 12/14>>>12/14 Vancomycin 12/14>>>12/14  SUBJECTIVE:  In HD nad at rest   VITAL SIGNS: Temp:  [97.2 F (36.2 C)-98.4 F (36.9 C)] 98.4 F (36.9 C) (12/16 0800) Pulse Rate:  [63-149] 110 (12/16 1100) Resp:  [0-29] 20 (12/16 1030) BP: (81-130)/(43-71) 82/52 mmHg (12/16 1100) SpO2:  [89 %-100 %] 96 % (12/16 0800) Weight:  [130 lb 4.7 oz (59.1 kg)] 130 lb 4.7 oz (59.1 kg) (12/16 0800) INTAKE / OUTPUT: Intake/Output     12/15 0701 - 12/16 0700 12/16 0701 - 12/17 0700   I.V. (mL/kg) 930.9 (16.2)    Blood 365    IV Piggyback 104    Total Intake(mL/kg) 1399.9 (24.4)    Emesis/NG output     Total Output 0     Net +1399.9            PHYSICAL EXAMINATION: General: Pleasant male patient in no acute distress. Eyes: Anicteric sclerae. ENT: mm dry, no JVD  Heart: Normal S1, S2. Systolic murmur II/VI  Lungs: resps even non labored on [place], essentially clear  Abdomen: Abdomen soft, non-tender and not distended, normoactive bowel sounds. No hepatosplenomegaly or masses. Musculoskeletal: No clubbing or synovitis. Skin: No rashes or lesions, LU thigh graft Neuro: No focal neurologic deficits. Mute  LABS:  CBC  Recent Labs Lab 10/03/13 1030 10/03/13 1830 10/04/13 0230  WBC 9.2 5.6 6.9  HGB 8.4* 6.3* 9.0*  HCT 24.4* 17.8* 25.8*  PLT 120* 95* 132*   Coag's  Recent  Labs Lab 10/02/13 0305  APTT 29  INR 1.13   BMET  Recent Labs Lab 10/03/13 0404 10/04/13 0020 10/04/13 0500  NA 138 136 137  K 3.6 3.9 3.8  CL 97 96 98  CO2 27 22 24   BUN 42* 51* 53*  CREATININE 6.72* 8.27* 8.55*  GLUCOSE 93 101* 93   Electrolytes  Recent Labs Lab 10/02/13 1535 10/03/13 0404 10/04/13 0020 10/04/13 0500  CALCIUM 8.6 9.4 9.1 8.9  PHOS 4.6 5.3* 5.7*  --    Sepsis Markers  Recent Labs Lab 10/02/13 0305 10/02/13 0700 10/03/13 1100  LATICACIDVEN 7.6* 5.3* 0.8   Liver Enzymes  Recent Labs Lab 10/02/13 0305 10/02/13 1535 10/03/13 0404 10/04/13 0020  AST 15  --   --   --   ALT 15  --   --   --   ALKPHOS 134*  --   --   --   BILITOT 0.2*  --   --   --   ALBUMIN 3.8 2.7* 2.6* 2.7*   Glucose  Recent Labs Lab 10/02/13 0726  GLUCAP 147*    Imaging No results found.  ASSESSMENT / PLAN:  PULMONARY A: Questionable LLL infiltrate P:   O2 as needed  F/u CXR as needed Hold abx for now  CARDIOVASCULAR A:  Hypotension - r/t UGI bleed >> baseline systolic blood pressure 75 to 90. lactic  acidosis >> resolved. P:  No options for vascular access, have d/w vascular and IR who reviewed recent venogram Gentle volume  Wean pressors as able -- change goal to SBP 85 - likely baseline based on outpt records   RENAL A:   ESRD on HD Hypokalemia P:   Replete K as needed  Renal following   HD 12/16 per usual TTS schedule   GASTROINTESTINAL A:   Upper GI bleed. 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  P:  Trend cbc  Transfuse for hgb<7  INFECTIOUS A:   No obvious source of infection. P:   Monitor clinically  ENDOCRINE A:   No issues  NEUROLOGIC A:    mental retardation and mutism  Mental status at baseline  Off pressors 12-16.  Will  arrange for transfer to SDU.  Will ask [facility] to assume care from 12/17 and PCCM sign off.  Brett Canales Minor ACNP [facility] Pollack PCCM Pager [phone] till 3 pm If no answer page [phone] 10/04/2013, 11:24 AM

## 2013-10-04 NOTE — Progress Notes (Signed)
CRITICAL VALUE ALERT  Critical value received:  hgb-6.3  Date of notification:  10/03/13  Time of notification:  1910  Critical value read back:yes  Nurse who received alert:  Montez Morita, RN  MD notified (1st page):  Dr. Molli Knock  Time of first page:  1915  MD notified (2nd page):  Time of second page:  Responding MD:  Dr. Molli Knock  Time MD responded:  [phone]

## 2013-10-04 NOTE — Progress Notes (Signed)
[facility] Progress Note    Subjective: got more prbc's yest, Hb 6.3 >> 9.0   Exam  Blood pressure 105/51, pulse 98, temperature 98 F (36.7 C), temperature source Oral, resp. rate 19, height 5' 4.96" (1.65 m), weight 57.4 kg (126 lb 8.7 oz), SpO2 100.00%. Gen: awake, tracking, no distress Neck: +jvd, mild-mod , +facial edema Lungs: clear bilat but poor effort, not cooperative  Heart: regular, no rub, M or gallop  Abd: soft, nt, nd, no mass or tenderness, +BS Ext: muscle wasting, mild diffuse nonpitting edema of UE's Neuro: alert, nf, tracks, mute, does not follow commands Access: L thigh AVG patent  Dialysis: [facility] TTS  3:45hrs   2K/2Ca bath   56kg   Heparin none   L thigh AVG   400/800  Hectorol 12   Epogen 15000   Venofer 50 weekly  Recent labs: Hgb 8.6, Tsat 34%, P 4.9, PTH 617, Alb 4.3   Assessment/Plan:  1. Hematemesis: question of varices/cirrhosis, no active bleed by EGD; GI evaluating 2. Anemia of CKD+ABLA: max aranesp 200/wk, maint IV fe, tranfusing prn per primary 3. Hypotension, chronic: baseline BP 90-110 4. ESRD: HD today 5. Volume: up 1kg, edematous >> UF 2-3 kg today w HD 6. MBD/CKD: Ca and Phos controlled. Continue low Ca bath and Vit D. Resume Renvela 1 AC once eating. 7. Nutrition - NPO for now. Renal diet and multivitamin once eating. 8. HD access - Uses L thigh AVG/ limited access options. Steady decline in access flows from > 1000 in September to now in the 600's. 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 Goals of care - Family desires full code   Vinson Moselle MD  pager [phone]    cell [phone]  10/04/2013, 8:51 AM   Recent Labs Lab 10/02/13 1535 10/03/13 0404 10/04/13 0020 10/04/13 0500  NA 141 138 136 137  K 3.8 3.6 3.9 3.8  CL 98 97 96 98  CO2 32 27 22 24   GLUCOSE 137* 93 101* 93  BUN 31* 42*  51* 53*  CREATININE 5.46* 6.72* 8.27* 8.55*  CALCIUM 8.6 9.4 9.1 8.9  PHOS 4.6 5.3* 5.7*  --     Recent Labs Lab 10/02/13 0305 10/02/13 1535 10/03/13 0404 10/04/13 0020  AST 15  --   --   --   ALT 15  --   --   --   ALKPHOS 134*  --   --   --   BILITOT 0.2*  --   --   --   PROT 7.2  --   --   --   ALBUMIN 3.8 2.7* 2.6* 2.7*    Recent Labs Lab 10/02/13 0305  10/03/13 1030 10/03/13 1830 10/04/13 0230  WBC 4.0  < > 9.2 5.6 6.9  NEUTROABS 2.7  --   --   --   --   HGB 8.7*  < > 8.4* 6.3* 9.0*  HCT 26.8*  < > 24.4* 17.8* 25.8*  MCV 98.9  < > 92.1 92.2 91.2  PLT 175  < > 120* 95* 132*  < > = values in this interval not displayed. . darbepoetin (ARANESP) injection - DIALYSIS  200 mcg Intravenous Q Tue-HD  . phenytoin (DILANTIN) IV  200 mg Intravenous QHS   . sodium chloride    . sodium chloride Stopped (10/04/13 0030)  . sodium chloride    .  pantoprozole (PROTONIX) infusion 8 mg/hr (10/04/13 0752)  . phenylephrine (NEO-SYNEPHRINE) Adult infusion Stopped (10/03/13 1939)   sodium chloride, sodium chloride, sodium chloride, feeding supplement (NEPRO CARB STEADY), heparin, lidocaine (PF), lidocaine-prilocaine, ondansetron (ZOFRAN) IV, pentafluoroprop-tetrafluoroeth

## 2013-10-05 ENCOUNTER — Inpatient Hospital Stay (HOSPITAL_COMMUNITY): Payer: Medicare Other

## 2013-10-05 LAB — CBC
HCT: 24.9 % — ABNORMAL LOW (ref 39.0–52.0)
HCT: 25.6 % — ABNORMAL LOW (ref 39.0–52.0)
HCT: 26.3 % — ABNORMAL LOW (ref 39.0–52.0)
Hemoglobin: 8.8 g/dL — ABNORMAL LOW (ref 13.0–17.0)
Hemoglobin: 8.9 g/dL — ABNORMAL LOW (ref 13.0–17.0)
MCH: 32.1 pg (ref 26.0–34.0)
MCH: 31.6 pg (ref 26.0–34.0)
MCHC: 35.3 g/dL (ref 30.0–36.0)
MCHC: 34.4 g/dL (ref 30.0–36.0)
MCHC: 33.8 g/dL (ref 30.0–36.0)
MCV: 90.9 fL (ref 78.0–100.0)
MCV: 93.1 fL (ref 78.0–100.0)
Platelets: 124 10*3/uL — ABNORMAL LOW (ref 150–400)
Platelets: 152 10*3/uL (ref 150–400)
Platelets: 145 10*3/uL — ABNORMAL LOW (ref 150–400)
RBC: 2.74 MIL/uL — ABNORMAL LOW (ref 4.22–5.81)
RBC: 2.75 MIL/uL — ABNORMAL LOW (ref 4.22–5.81)
RBC: 2.82 MIL/uL — ABNORMAL LOW (ref 4.22–5.81)
RDW: 15.5 % (ref 11.5–15.5)
WBC: 5.2 10*3/uL (ref 4.0–10.5)
WBC: 5.4 10*3/uL (ref 4.0–10.5)

## 2013-10-05 LAB — RENAL FUNCTION PANEL
CO2: 21 mEq/L (ref 19–32)
Chloride: 98 mEq/L (ref 96–112)
Creatinine, Ser: 5.94 mg/dL — ABNORMAL HIGH (ref 0.50–1.35)
GFR calc Af Amer: 10 mL/min — ABNORMAL LOW (ref >90)
GFR calc non Af Amer: 9 mL/min — ABNORMAL LOW (ref >90)
Phosphorus: 5 mg/dL — ABNORMAL HIGH (ref 2.3–4.6)
Potassium: 3.8 mEq/L (ref 3.5–5.1)
Sodium: 139 mEq/L (ref 135–145)

## 2013-10-05 MED ORDER — ALTEPLASE 2 MG IJ SOLR
2.0000 mg | Freq: Once | INTRAMUSCULAR | Status: DC | PRN
  Filled 2013-10-05: qty 2

## 2013-10-05 MED ORDER — MIDODRINE HCL 5 MG PO TABS: 10.0000 mg | ORAL_TABLET | Freq: Three times a day (TID) | ORAL | Status: DC

## 2013-10-05 MED ORDER — HEPARIN SODIUM (PORCINE) 1000 UNIT/ML DIALYSIS: 1000.0000 [IU] | INTRAMUSCULAR | Status: DC | PRN

## 2013-10-05 MED ORDER — MIDODRINE HCL 5 MG PO TABS
15.0000 mg | ORAL_TABLET | Freq: Three times a day (TID) | ORAL | Status: DC
  Filled 2013-10-05 (×12): qty 3

## 2013-10-05 MED ORDER — SODIUM CHLORIDE 0.9 % IV SOLN: 100.0000 mL | INTRAVENOUS | Status: DC | PRN

## 2013-10-05 MED ORDER — LIDOCAINE HCL (PF) 1 % IJ SOLN: 5.0000 mL | INTRAMUSCULAR | Status: DC | PRN

## 2013-10-05 MED ORDER — IOHEXOL 300 MG/ML  SOLN: 25.0000 mL | INTRAMUSCULAR | Status: AC

## 2013-10-05 MED ORDER — LIDOCAINE-PRILOCAINE 2.5-2.5 % EX CREA
1.0000 "application " | TOPICAL_CREAM | CUTANEOUS | Status: DC | PRN
  Filled 2013-10-05: qty 5

## 2013-10-05 MED ORDER — NEPRO/CARBSTEADY PO LIQD
237.0000 mL | ORAL | Status: DC | PRN
  Filled 2013-10-05: qty 237

## 2013-10-05 MED ORDER — PENTAFLUOROPROP-TETRAFLUOROETH EX AERO: 1.0000 "application " | INHALATION_SPRAY | CUTANEOUS | Status: DC | PRN

## 2013-10-05 MED ORDER — IOHEXOL 300 MG/ML  SOLN
80.0000 mL | Freq: Once | INTRAMUSCULAR | Status: AC | PRN
  Administered 2013-10-05: 80 mL via INTRAVENOUS

## 2013-10-05 NOTE — Progress Notes (Signed)
Internal Medicine Attending  Date: 10/05/2013  Patient name: [name] Medical record number: [id number] Date of birth: [birth year] Age: 62 y.o. Gender: male  I saw and evaluated the patient, reviewed the chart, and discussed his care with house staff. I reviewed the resident's note by Dr. Zada Girt and I agree with the resident's findings and plans as documented in his note.

## 2013-10-05 NOTE — Progress Notes (Signed)
[name] Progress Note  Subjective:   Resting quietly in bed. MR/Mutism limiting ROS. Hypotensive.   Objective Filed Vitals:   10/05/13 0600 10/05/13 0700 10/05/13 0800 10/05/13 0820  BP: 94/54 109/56 85/44   Pulse: 111 83 92   Temp:    98.3 F (36.8 C)  TempSrc:    Oral  Resp: 28 23 24    Height:      Weight:      SpO2: 100% 100% 95%    Physical Exam General: alert, mute, NAD Heart: RRR Lungs: Coarse breath sounds. Poor effort/does not follow commands.  Abdomen: Soft, NT, non-distended, +BS Extremities: SCDs present. No LE edema Dialysis Access: L thigh AVG patent  CT chest shows layering bilat pleural effusions and small long volumes  Dialysis: [facility] TTS  3:45hrs 2K/2Ca bath 56kg Heparin none L thigh AVG 400/800  Hectorol 12 Epogen 15000 Venofer 50 weekly  Recent labs: Hgb 8.6, Tsat 34%, P 4.9, PTH 617, Alb 4.3  Assessment/Plan: 1. Hematemesis: question of varices/cirrhosis, no active bleed by EGD; GI evaluating. For CT chest and ABM today 2. Anemia of CKD+ABLA: Hgb 8.8 s/p 1 unit PRBCs. Max aranesp 200/wk, maint IV fe, tranfusing prn per primary 3. Hypotension, chronic: baseline BP 90-110, SBPs in the 80s today off pressors.  Resume prev dose Midodrine 15 mg TID. 4. ESRD: HD tomorrow, with added K+ bath  Will do extra HD today for volume overload 5. Volume: Up almost 4kg, large goal for him. Would limit further IVF unless symptomatic. UF 3L tomorrow and extend time to 4hrs to facilitate volume removal in setting of hypotension. 6. MBD/CKD: Ca and Phos controlled. Continue low Ca bath and Vit D. Resume Renvela 1 AC once eating. 7. Nutrition - NPO for now. Renal diet and multivitamin once eating. 8. HD access - Uses L thigh AVG/ limited access options. Steady decline in access flows from > 1000 in September to now in the 600's. 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 Goals of care - Family desires full code  [name]. [name] John, PA-C [facility] Pager [phone] 10/05/2013,11:35 AM  LOS: 3 days   I have seen and examined patient, discussed with PA and agree with assessment and plan as outlined above with additions as indicated. [name] pager [phone]    cell [phone] 10/05/2013, 1:20 PM      Additional Objective Labs: Basic Metabolic Panel:  Recent Labs Lab 10/03/13 0404 10/04/13 0020 10/04/13 0500 10/04/13 1830  NA 138 136 137 139  K 3.6 3.9 3.8 3.5  CL 97 96 98 99  CO2 27 22 24 25   GLUCOSE 93 101* 93 78  BUN 42* 51* 53* 17  CREATININE 6.72* 8.27* 8.55* 4.06*  CALCIUM 9.4 9.1 8.9 8.9  PHOS 5.3* 5.7*  --  3.7   Liver Function Tests:  Recent Labs Lab 10/02/13 0305  10/03/13 0404 10/04/13 0020 10/04/13 1830  AST 15  --   --   --   --   ALT 15  --   --   --   --   ALKPHOS 134*  --   --   --   --   BILITOT 0.2*  --   --   --   --   PROT 7.2  --   --   --   --   ALBUMIN  3.8  < > 2.6* 2.7* 2.9*  < > = values in this interval not displayed.  Recent Labs Lab 10/02/13 0305  LIPASE 56   CBC:  Recent Labs Lab 10/02/13 0305  10/03/13 1830 10/04/13 0230 10/04/13 1700 10/04/13 1830 10/05/13 0200  WBC 4.0  < > 5.6 6.9 4.3 5.8 5.2  NEUTROABS 2.7  --   --   --   --   --   --   HGB 8.7*  < > 6.3* 9.0* 6.9* 9.2* 8.8*  HCT 26.8*  < > 17.8* 25.8* 19.5* 26.7* 24.9*  MCV 98.9  < > 92.2 91.2 89.9 92.1 90.9  PLT 175  < > 95* 132* 82* 123* 124*  < > = values in this interval not displayed. Blood Culture    Component Value Date/Time   SDES BLOOD LEFT HAND 10/02/2013 1028   SPECREQUEST BOTTLES DRAWN AEROBIC AND ANAEROBIC 10CC 10/02/2013 1028   CULT  Value:        BLOOD CULTURE RECEIVED NO GROWTH TO DATE CULTURE WILL BE HELD FOR 5 DAYS BEFORE ISSUING A FINAL NEGATIVE REPORT Performed at [facility] 10/02/2013 1028   REPTSTATUS PENDING 10/02/2013 1028     CBG:  Recent Labs Lab 10/02/13 0726  GLUCAP 147*    Studies/Results: No results found. Medications:   . darbepoetin (ARANESP) injection - DIALYSIS  200 mcg Intravenous Q Tue-HD  . midodrine  15 mg Oral TID WC  . phenytoin (DILANTIN) IV  200 mg Intravenous QHS

## 2013-10-05 NOTE — Progress Notes (Signed)
Subjective: No acute events.  Still hypotensive.  Objective: Vital signs in last 24 hours: Temp:  [97.1 F (36.2 C)-98.4 F (36.9 C)] 98.3 F (36.8 C) (12/17 0400) Pulse Rate:  [95-113] 105 (12/17 0500) Resp:  [15-30] 21 (12/17 0500) BP: (67-111)/(46-60) 108/50 mmHg (12/17 0500) SpO2:  [88 %-100 %] 96 % (12/17 0500) Arterial Line BP: (75-122)/(50-64) 109/60 mmHg (12/17 0500) Weight:  [125 lb 3.5 oz (56.8 kg)-132 lb 0.9 oz (59.9 kg)] 132 lb 0.9 oz (59.9 kg) (12/17 0400) Last BM Date: 10/01/13  Intake/Output from previous day: 12/16 0701 - 12/17 0700 In: 897.3 [I.V.:793.3; IV Piggyback:104] Out: 2042  Intake/Output this shift:    General appearance: alert GI: soft, non-tender; bowel sounds normal; no masses,  no organomegaly  Lab Results:  Recent Labs  10/04/13 1700 10/04/13 1830 10/05/13 0200  WBC 4.3 5.8 5.2  HGB 6.9* 9.2* 8.8*  HCT 19.5* 26.7* 24.9*  PLT 82* 123* 124*   BMET  Recent Labs  10/04/13 0020 10/04/13 0500 10/04/13 1830  NA 136 137 139  K 3.9 3.8 3.5  CL 96 98 99  CO2 22 24 25   GLUCOSE 101* 93 78  BUN 51* 53* 17  CREATININE 8.27* 8.55* 4.06*  CALCIUM 9.1 8.9 8.9   LFT  Recent Labs  10/04/13 1830  ALBUMIN 2.9*   PT/INR No results found for this basename: LABPROT, INR,  in the last 72 hours Hepatitis Panel No results found for this basename: HEPBSAG, HCVAB, HEPAIGM, HEPBIGM,  in the last 72 hours C-Diff No results found for this basename: CDIFFTOX,  in the last 72 hours Fecal Lactopherrin No results found for this basename: FECLLACTOFRN,  in the last 72 hours  Studies/Results: No results found.  Medications:  Scheduled: . darbepoetin (ARANESP) injection - DIALYSIS  200 mcg Intravenous Q Tue-HD  . phenytoin (DILANTIN) IV  200 mg Intravenous QHS   Continuous:   Assessment/Plan: 1) GI bleed.  ? Esophageal varices.   2) ESRD. 3) Hypotension - off pressors.   The patient is stable despite the low blood pressures.  He has a  history of a baseline SBP in the 70-80's.  I discussed the patient's case with Radiology.  A CT scan of the chest and ABM will be acceptable even with his ESRD.  This may shed some light on his bleeding.  If he has esophageal varices, I will repeat an EGD with banding.  Plan: 1) CT scan of the chest and ABM with IV contrast. 2) Continue to follow HGB and transfuse as necessary.  LOS: 3 days   Sean Macwilliams D 10/05/2013, 8:04 AM

## 2013-10-05 NOTE — Progress Notes (Signed)
ICU TRANSFER NOTE   Interim History:    [name] is a 62 year old male with a PMH of ESRD on HD TTS, portal hypertension and esophageal varices and prior gastric ulcer, superior vena cava syndrome, mental retardation, mutism, and epilepsy who was re-admitted to the ICU on 10/02/2013 for upper GIB and hypotension requiring pressors. EGD by Dr Elnoria Howard revealed 12/15 oesophageal varices, no bleeding. He was hypotensive and in shock and received pressors in ICU. He received 2 units of blood transfusion and Hb improved 6.3 >>9.0> and 8.8 today. Due to mental retardation/mutism most of the history is obtained from his sister was also his power of attorney.  He had been admitted previously 11/26-11/29 for the same and he had 3 prior episode of the same. He follow with [facility] who have even considered nosebleed as the source. EGD 11/27 no active bleeding.   He was transferred from ICU to IMTS on 10/05/2013. GI will follow up closely as per Dr Elnoria Howard.   Today patient appears comfortable. No visible active bleeding.    Objective:    Vital Signs:   Temp:  [97.1 F (36.2 C)-98.4 F (36.9 C)] 98.3 F (36.8 C) (12/17 0400) Pulse Rate:  [95-113] 105 (12/17 0500) Resp:  [15-30] 21 (12/17 0500) BP: (67-111)/(46-60) 108/50 mmHg (12/17 0500) SpO2:  [88 %-100 %] 96 % (12/17 0500) Arterial Line BP: (75-122)/(50-64) 109/60 mmHg (12/17 0500) Weight:  [125 lb 3.5 oz (56.8 kg)-132 lb 0.9 oz (59.9 kg)] 132 lb 0.9 oz (59.9 kg) (12/17 0400) Last BM Date: 10/01/13   Weights: 24-hour Weight change:   Filed Weights   10/04/13 0800 10/04/13 1202 10/05/13 0400  Weight: 130 lb 4.7 oz (59.1 kg) 125 lb 3.5 oz (56.8 kg) 132 lb 0.9 oz (59.9 kg)     Intake/Output:   Intake/Output Summary (Last 24 hours) at 10/05/13 4098 Last data filed at 10/05/13 0600  Gross per 24 hour  Intake 897.34 ml  Output   2042 ml  Net -1144.66 ml       Physical Exam: Patient has arterial line in right radial artery. Blood  pressure was 90-100/56- 60s by a-line. Pulse rate of 98. General: Pleasant male patient in no acute distress. Pt is mute. He obeys commands.   Eyes: Anicteric sclerae.  ENT: mm dry, no JVD  Heart: Normal S1, S2. Systolic murmur II/VI  Lungs: resps even non labored on [place], essentially clear  Abdomen: Abdomen soft, non-tender and not distended, normoactive bowel sounds. No hepatosplenomegaly or masses.  Musculoskeletal: No clubbing or synovitis.  Skin: No rashes or lesions, LU thigh graft  Neuro: No focal neurologic deficits. Mute   Labs: Basic Metabolic Panel:  Recent Labs Lab 10/02/13 1535 10/03/13 0404 10/04/13 0020 10/04/13 0500 10/04/13 1830  NA 141 138 136 137 139  K 3.8 3.6 3.9 3.8 3.5  CL 98 97 96 98 99  CO2 32 27 22 24 25   GLUCOSE 137* 93 101* 93 78  BUN 31* 42* 51* 53* 17  CREATININE 5.46* 6.72* 8.27* 8.55* 4.06*  CALCIUM 8.6 9.4 9.1 8.9 8.9  PHOS 4.6 5.3* 5.7*  --  3.7    Liver Function Tests:  Recent Labs Lab 10/02/13 0305 10/02/13 1535 10/03/13 0404 10/04/13 0020 10/04/13 1830  AST 15  --   --   --   --   ALT 15  --   --   --   --   ALKPHOS 134*  --   --   --   --  BILITOT 0.2*  --   --   --   --   PROT 7.2  --   --   --   --   ALBUMIN 3.8 2.7* 2.6* 2.7* 2.9*    Recent Labs Lab 10/02/13 0305  LIPASE 56    Recent Labs Lab 10/02/13 0305  AMMONIA 28    CBC:  Recent Labs Lab 10/02/13 0305  10/03/13 1830 10/04/13 0230 10/04/13 1700 10/04/13 1830 10/05/13 0200  WBC 4.0  < > 5.6 6.9 4.3 5.8 5.2  NEUTROABS 2.7  --   --   --   --   --   --   HGB 8.7*  < > 6.3* 9.0* 6.9* 9.2* 8.8*  HCT 26.8*  < > 17.8* 25.8* 19.5* 26.7* 24.9*  MCV 98.9  < > 92.2 91.2 89.9 92.1 90.9  PLT 175  < > 95* 132* 82* 123* 124*  < > = values in this interval not displayed.  CBG:  Recent Labs Lab 10/02/13 0726  GLUCAP 147*   Imaging: No results found.    Medications:    Infusions:     Scheduled Medications: . darbepoetin (ARANESP) injection -  DIALYSIS  200 mcg Intravenous Q Tue-HD  . phenytoin (DILANTIN) IV  200 mg Intravenous QHS     PRN Medications: sodium chloride, sodium chloride, sodium chloride, sodium chloride, sodium chloride, feeding supplement (NEPRO CARB STEADY), feeding supplement (NEPRO CARB STEADY), heparin, heparin, lidocaine (PF), lidocaine (PF), lidocaine-prilocaine, lidocaine-prilocaine, ondansetron (ZOFRAN) IV, pentafluoroprop-tetrafluoroeth, pentafluoroprop-tetrafluoroeth   Assessment/ Plan:   62 year old male with a PMH of ESRD on HD TTS, portal hypertension and esophageal varices and prior gastric ulcer, superior vena cava syndrome, mental retardation, mutism, and epilepsy who was re-admitted to the ICU on 10/02/2013 for upper GIB.  Presumed Upper GI  Bleeding: Patient with past medical history of esophageal ulcers and prior gastric ulcers. EGD performed by Dr. Elnoria Howard, revealed nonbleeding esophageal ulcers without active bleeding. Patient was hypotensive on admission, and required 2 units of blood transfusion, between 12/14 to 12/15. His hemoglobin is currently stable at 8.8 Plan. - off pressors - Dr. Elnoria Howard will follow closely.  - CT scan of the chest and ABM with IV contrast to look for source of bleeding to Dr. Elnoria Howard. - Monitor with CBCs. Hemoglobin Goal of 7.0. - Acute blood loss Anemia:  Hypotension: SBP= 90-100, DBP 50-60, was in shock on admission to the ICU, which were thought to be secondary to upper GI bleeding. Current on midodrine po. Pt also on renal dialysis Plan. -Will be cautious with IV fluids in the setting of hemodialysis. -Continue with po midodrine. -Monitor blood pressure closely  Seizure disorder: Stable without active seizures. Continue with IV Dilantin.  ESRD on hemodialysis: Being followed by the renal.  Dispo: Disposition is deferred at this time, awaiting improvement of current medical problems.  Anticipated discharge in approximately 2-3 day(s).   The patient does have current  PCP (Manson Passey, Alycia Rossetti, MD), therefore is require OPC follow-up after discharge.   The patient does not know have transportation limitations that hinder transportation to clinic appointments.  .Services Needed at time of discharge: Y = Yes, Blank = No PT:   OT:   RN:   Equipment:   Other:     Length of Stay: 3 days   Signed by:  Dow Adolph, MD PGY-I, Internal Medicine Pager [phone] 10/05/2013, 6:33 AM

## 2013-10-06 LAB — RENAL FUNCTION PANEL
BUN: 19 mg/dL (ref 6–23)
CO2: 23 mEq/L (ref 19–32)
Calcium: 9.7 mg/dL (ref 8.4–10.5)
Chloride: 98 mEq/L (ref 96–112)
Glucose, Bld: 65 mg/dL — ABNORMAL LOW (ref 70–99)
Phosphorus: 4.8 mg/dL — ABNORMAL HIGH (ref 2.3–4.6)
Potassium: 3.7 mEq/L (ref 3.5–5.1)
Sodium: 140 mEq/L (ref 135–145)

## 2013-10-06 LAB — CBC
HCT: 28 % — ABNORMAL LOW (ref 39.0–52.0)
Hemoglobin: 9.4 g/dL — ABNORMAL LOW (ref 13.0–17.0)
MCV: 94.9 fL (ref 78.0–100.0)
RDW: 14.7 % (ref 11.5–15.5)
WBC: 6.2 10*3/uL (ref 4.0–10.5)

## 2013-10-06 MED ORDER — MIDODRINE HCL 5 MG PO TABS
ORAL_TABLET | ORAL | Status: AC
  Filled 2013-10-06: qty 3

## 2013-10-06 MED ORDER — PANTOPRAZOLE SODIUM 40 MG PO TBEC
40.0000 mg | DELAYED_RELEASE_TABLET | Freq: Two times a day (BID) | ORAL | Status: DC
  Filled 2013-10-06 (×2): qty 1

## 2013-10-06 MED ORDER — PHENYTOIN 50 MG PO CHEW
200.0000 mg | CHEWABLE_TABLET | Freq: Every day | ORAL | Status: DC
  Filled 2013-10-06 (×2): qty 4

## 2013-10-06 NOTE — Progress Notes (Signed)
[facility] Progress Note    Subjective: no complaints, Hb 8.9   Exam  Blood pressure 116/50, pulse 92, temperature 98.9 F (37.2 C), temperature source Oral, resp. rate 22, height 5' 4.96" (1.65 m), weight 60 kg (132 lb 4.4 oz), SpO2 98.00%. Gen: alert, mute, NAD  Heart: RRR  Lungs: Coarse breath sounds. Poor effort/does not follow commands.  Abd: Soft, NT, non-distended, +BS  Ext: SCDs present. No LE edema  Access: L thigh AVG patent   Dialysis: [facility] TTS  3:45hrs 2K/2Ca bath 56kg Heparin none L thigh AVG 400/800  Hectorol 12 Epogen 15000 Venofer 50 weekly  Recent labs: Hgb 8.6, Tsat 34%, P 4.9, PTH 617, Alb 4.3   Assessment/Plan:  1. Hematemesis / UBIG: resolved, GI eval, unsure cause;  Mild cirrhosis by CT but no varices seen 2. Anemia of CKD/ABL: Hgb 8.8 s/p 1 unit PRBCs. Max aranesp 200/wk, maint IV fe, tranfusing prn per primary 3. Hypotension, chronic: baseline BP 90-110, resumed prev dose Midodrine 15 mg TID. 4. ESRD: HD today, pulm edema and effusions by CT, max UF , tolerate lower BP's on midodrine 5. MBD/CKD: Ca and Phos controlled. Continue low Ca bath and Vit D. Resume Renvela 1 AC once eating. 6. Nutrition - NPO for now. Renal diet and multivitamin once eating. 7. HD access - Uses L thigh AVG/ limited access options. Steady decline in access flows from > 1000 in September to now in the 600's. Shuntogram ordered outpatient, but must be done under sedation due to his MR and anxiety; consider doing this later this admit if stabilizes 8. 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 Goals of care - Family desires full code   Vinson Moselle MD  pager [phone]    cell [phone]  10/06/2013, 12:52 PM   Recent Labs Lab 10/04/13 0020 10/04/13 0500 10/04/13 1830 10/05/13 1210  NA 136 137 139 139  K 3.9 3.8 3.5 3.8  CL 96 98 99 98  CO2 22 24 25 21   GLUCOSE 101* 93 78 67*  BUN 51* 53* 17 24*  CREATININE 8.27* 8.55* 4.06* 5.94*  CALCIUM 9.1 8.9 8.9 8.9  PHOS 5.7*  --   3.7 5.0*    Recent Labs Lab 10/02/13 0305  10/04/13 0020 10/04/13 1830 10/05/13 1210  AST 15  --   --   --   --   ALT 15  --   --   --   --   ALKPHOS 134*  --   --   --   --   BILITOT 0.2*  --   --   --   --   PROT 7.2  --   --   --   --   ALBUMIN 3.8  < > 2.7* 2.9* 2.9*  < > = values in this interval not displayed.  Recent Labs Lab 10/02/13 0305  10/05/13 0200 10/05/13 1030 10/05/13 2230  WBC 4.0  < > 5.2 5.4 5.8  NEUTROABS 2.7  --   --   --   --   HGB 8.7*  < > 8.8* 8.8* 8.9*  HCT 26.8*  < > 24.9* 25.6* 26.3*  MCV 98.9  < > 90.9 93.1 93.3  PLT 175  < > 124* 152 145*  < > = values in this interval not displayed. . darbepoetin (ARANESP) injection - DIALYSIS  200 mcg Intravenous Q Tue-HD  . midodrine  15 mg Oral TID WC  . pantoprazole  40 mg Oral BID  . phenytoin (DILANTIN)  IV  200 mg Intravenous QHS     ondansetron (ZOFRAN) IV

## 2013-10-06 NOTE — Progress Notes (Signed)
Subjective: No acute events.  HGB stable.  Objective: Vital signs in last 24 hours: Temp:  [97.6 F (36.4 C)-98.8 F (37.1 C)] 98.5 F (36.9 C) (12/18 0340) Pulse Rate:  [93-112] 100 (12/18 0340) Resp:  [9-27] 25 (12/18 0340) BP: (101-122)/(37-63) 101/56 mmHg (12/18 0340) SpO2:  [92 %-100 %] 96 % (12/18 0340) Arterial Line BP: (79-119)/(37-61) 80/58 mmHg (12/18 0340) Weight:  [132 lb 4.4 oz (60 kg)] 132 lb 4.4 oz (60 kg) (12/17 1430) Last BM Date: 10/01/13  Intake/Output from previous day: 12/17 0701 - 12/18 0700 In: 595.8 [I.V.:491.8; IV Piggyback:104] Out: 1898  Intake/Output this shift:    General appearance: alert and no distress GI: soft, non-tender; bowel sounds normal; no masses,  no organomegaly  Lab Results:  Recent Labs  10/05/13 0200 10/05/13 1030 10/05/13 2230  WBC 5.2 5.4 5.8  HGB 8.8* 8.8* 8.9*  HCT 24.9* 25.6* 26.3*  PLT 124* 152 145*   BMET  Recent Labs  10/04/13 0500 10/04/13 1830 10/05/13 1210  NA 137 139 139  K 3.8 3.5 3.8  CL 98 99 98  CO2 24 25 21   GLUCOSE 93 78 67*  BUN 53* 17 24*  CREATININE 8.55* 4.06* 5.94*  CALCIUM 8.9 8.9 8.9   LFT  Recent Labs  10/05/13 1210  ALBUMIN 2.9*   PT/INR No results found for this basename: LABPROT, INR,  in the last 72 hours Hepatitis Panel No results found for this basename: HEPBSAG, HCVAB, HEPAIGM, HEPBIGM,  in the last 72 hours C-Diff No results found for this basename: CDIFFTOX,  in the last 72 hours Fecal Lactopherrin No results found for this basename: FECLLACTOFRN,  in the last 72 hours  Studies/Results: Ct Chest W Contrast  10/05/2013   CLINICAL DATA:  Cirrhosis of the liver. Evaluate for esophageal varices.  EXAM: CT CHEST AND ABDOMEN WITH CONTRAST  TECHNIQUE: Multidetector CT imaging of the chest and abdomen was performed following the standard protocol during bolus administration of intravenous contrast.  CONTRAST:  80mL OMNIPAQUE IOHEXOL 300 MG/ML  SOLN  COMPARISON:  None   FINDINGS: CT CHEST FINDINGS  There are moderate bilateral pleural effusions identified. Mild diffuse interstitial edema is identified. Compressive type atelectasis is present in the lower lobes overlying both effusions.  The trachea appears patent. Normal heart size. No pericardial effusion. The stress set calcified atherosclerotic disease affects the thoracic aorta. Right paratracheal lymph node measures 1.2 cm. No significant esophageal varices identified.  There is no axillary or supraclavicular adenopathy identified. Extensive ventral and lateral chest wall collateral vessels are identified. 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  Review of the visualized bony structures is significant for mild thoracic spondylosis no aggressive lytic or sclerotic bone lesions.  CT ABDOMEN FINDINGS  No focal liver abnormality identified. The liver has a mildly nodular contour. No focal liver abnormality noted. Small stones are gallbladder sludge identified within the dependent portion of the gallbladder. No gallbladder wall thickening or pericholecystic fluid. The pancreas is normal. The spleen is normal in appearance.  Normal appearance of both adrenal glands. End stage bilateral cystic kidneys are identified. These are of varying attenuation and complexity. Within the lower pole of the left kidney there is a dominant cyst measuring 4.8 cm, which contains a mural nodule measuring 1 cm, image 50/series 5. The urinary bladder appears collapsed. The prostate gland and seminal vesicles are unremarkable.  Mild calcified atherosclerotic disease involves the abdominal aorta. There is no aneurysm. No upper abdominal adenopathy  identified. There is no pelvic or inguinal adenopathy. Bilateral lower extremity femoral grafts are identified.  No ascites identified. No abnormal fluid collections noted. The stomach appears normal. The small bowel loops have a normal course and caliber without  obstruction. Normal appearance of the colon.  Review of the visualized osseous structures shows mild scoliosis and spondylosis. No aggressive lytic or sclerotic bone lesions.  IMPRESSION: CT chest:  1. Moderate bilateral pleural effusions and interstitial edema consistent with CHF. 2. 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 3. No evidence for esophageal varices.  CT abdomen:  1. No acute findings within the abdomen or pelvis. 2. End-stage cystic kidneys. There are numerous bilateral renal cysts of varying complexity. 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 Urologic consultation is recommended. 3. Mild changes of cirrhosis. 4. Extensive atherosclerotic disease.   Electronically Signed   By: Signa Kell M.D.   On: 10/05/2013 14:08   Ct Abdomen W Contrast  10/05/2013   CLINICAL DATA:  Cirrhosis of the liver. Evaluate for esophageal varices.  EXAM: CT CHEST AND ABDOMEN WITH CONTRAST  TECHNIQUE: Multidetector CT imaging of the chest and abdomen was performed following the standard protocol during bolus administration of intravenous contrast.  CONTRAST:  80mL OMNIPAQUE IOHEXOL 300 MG/ML  SOLN  COMPARISON:  None  FINDINGS: CT CHEST FINDINGS  There are moderate bilateral pleural effusions identified. Mild diffuse interstitial edema is identified. Compressive type atelectasis is present in the lower lobes overlying both effusions.  The trachea appears patent. Normal heart size. No pericardial effusion. The stress set calcified atherosclerotic disease affects the thoracic aorta. Right paratracheal lymph node measures 1.2 cm. No significant esophageal varices identified.  There is no axillary or supraclavicular adenopathy identified. Extensive ventral and lateral chest wall collateral vessels are identified. 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  Review of the visualized bony structures is significant for mild  thoracic spondylosis no aggressive lytic or sclerotic bone lesions.  CT ABDOMEN FINDINGS  No focal liver abnormality identified. The liver has a mildly nodular contour. No focal liver abnormality noted. Small stones are gallbladder sludge identified within the dependent portion of the gallbladder. No gallbladder wall thickening or pericholecystic fluid. The pancreas is normal. The spleen is normal in appearance.  Normal appearance of both adrenal glands. End stage bilateral cystic kidneys are identified. These are of varying attenuation and complexity. Within the lower pole of the left kidney there is a dominant cyst measuring 4.8 cm, which contains a mural nodule measuring 1 cm, image 50/series 5. The urinary bladder appears collapsed. The prostate gland and seminal vesicles are unremarkable.  Mild calcified atherosclerotic disease involves the abdominal aorta. There is no aneurysm. No upper abdominal adenopathy identified. There is no pelvic or inguinal adenopathy. Bilateral lower extremity femoral grafts are identified.  No ascites identified. No abnormal fluid collections noted. The stomach appears normal. The small bowel loops have a normal course and caliber without obstruction. Normal appearance of the colon.  Review of the visualized osseous structures shows mild scoliosis and spondylosis. No aggressive lytic or sclerotic bone lesions.  IMPRESSION: CT chest:  1. Moderate bilateral pleural effusions and interstitial edema consistent with CHF. 2. 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 3. No evidence for esophageal varices.  CT abdomen:  1. No acute findings within the abdomen or pelvis. 2. End-stage cystic kidneys. There are numerous bilateral renal cysts of varying complexity.  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 Urologic consultation is recommended. 3. Mild changes of cirrhosis. 4. Extensive atherosclerotic disease.   Electronically Signed    By: Signa Kell M.D.   On: 10/05/2013 14:08    Medications:  Scheduled: . darbepoetin (ARANESP) injection - DIALYSIS  200 mcg Intravenous Q Tue-HD  . midodrine  15 mg Oral TID WC  . pantoprazole  40 mg Oral BID  . phenytoin (DILANTIN) IV  200 mg Intravenous QHS   Continuous:   Assessment/Plan: 1) GI bleed. 2) Baseline hypotension.   CT scan is neg for esophageal varices, however, there are findings of mild cirrhosis on the CT scan.  His HGB is stable.  Essentially I cannot identify any source of bleeding for him at this time.    Plan: 1) Follow HGB and transfuse as necessary. 2) Signing off.  LOS: 4 days   Julita Ozbun D 10/06/2013, 8:21 AM

## 2013-10-06 NOTE — Progress Notes (Signed)
Subjective: Lying in bed comfortably, awake, but mute.   Objective: Vital signs in last 24 hours: Filed Vitals:   10/06/13 0751 10/06/13 0823 10/06/13 1151 10/06/13 1212  BP: 107/46  116/50   Pulse: 90  92   Temp:  98.8 F (37.1 C)  98.9 F (37.2 C)  TempSrc:  Oral  Oral  Resp: 18  22   Height:      Weight:      SpO2: 95%  98%    Weight change: 1 lb 15.8 oz (0.9 kg)  Intake/Output Summary (Last 24 hours) at 10/06/13 1435 Last data filed at 10/06/13 0900  Gross per 24 hour  Intake 322.33 ml  Output   1898 ml  Net -1575.67 ml   Physical exam-  General appearance: alert,  appears stated age and no distress Lungs: clear to auscultation bilaterally, no crackles or rubs. Heart: regular rate and rhythm, S1, S2 normal, no murmur, click, rub or gallop Abdomen: soft, non-tender; bowel sounds normal; no masses,  no organomegaly Extremities: extremities normal, atraumatic, no cyanosis or edema Pulses: 2+ and symmetric, no pedal edema.  Lab Results: Basic Metabolic Panel:  Recent Labs Lab 10/04/13 1830 10/05/13 1210  NA 139 139  K 3.5 3.8  CL 99 98  CO2 25 21  GLUCOSE 78 67*  BUN 17 24*  CREATININE 4.06* 5.94*  CALCIUM 8.9 8.9  PHOS 3.7 5.0*   Liver Function Tests:  Recent Labs Lab 10/02/13 0305  10/04/13 1830 10/05/13 1210  AST 15  --   --   --   ALT 15  --   --   --   ALKPHOS 134*  --   --   --   BILITOT 0.2*  --   --   --   PROT 7.2  --   --   --   ALBUMIN 3.8  < > 2.9* 2.9*  < > = values in this interval not displayed.  Recent Labs Lab 10/02/13 0305  LIPASE 56    Recent Labs Lab 10/02/13 0305  AMMONIA 28   CBC:  Recent Labs Lab 10/02/13 0305  10/05/13 1030 10/05/13 2230  WBC 4.0  < > 5.4 5.8  NEUTROABS 2.7  --   --   --   HGB 8.7*  < > 8.8* 8.9*  HCT 26.8*  < > 25.6* 26.3*  MCV 98.9  < > 93.1 93.3  PLT 175  < > 152 145*  < > = values in this interval not displayed. CBG:  Recent Labs Lab 10/02/13 0726  GLUCAP 147*    Coagulation:  Recent Labs Lab 10/02/13 0305  LABPROT 14.3  INR 1.13   Micro Results: Recent Results (from the past 240 hour(s))  MRSA PCR SCREENING     Status: None   Collection Time    10/02/13  7:16 AM      Result Value Range Status   MRSA by PCR NEGATIVE  NEGATIVE Final   Comment:            The GeneXpert MRSA Assay (FDA     approved for NASAL specimens     only), is one component of a     comprehensive MRSA colonization     surveillance program. It is not     intended to diagnose MRSA     infection nor to guide or     monitor treatment for     MRSA infections.  CULTURE, BLOOD (ROUTINE X 2)  Status: None   Collection Time    10/02/13 10:00 AM      Result Value Range Status   Specimen Description BLOOD RIGHT ARM   Final   Special Requests BOTTLES DRAWN AEROBIC AND ANAEROBIC 10CC   Final   Culture  Setup Time     Final   Value: 10/02/2013 17:04     Performed at [facility]   Culture     Final   Value:        BLOOD CULTURE RECEIVED NO GROWTH TO DATE CULTURE WILL BE HELD FOR 5 DAYS BEFORE ISSUING A FINAL NEGATIVE REPORT     Performed at [facility]   Report Status PENDING   Incomplete  CULTURE, BLOOD (ROUTINE X 2)     Status: None   Collection Time    10/02/13 10:28 AM      Result Value Range Status   Specimen Description BLOOD LEFT HAND   Final   Special Requests BOTTLES DRAWN AEROBIC AND ANAEROBIC 10CC   Final   Culture  Setup Time     Final   Value: 10/02/2013 17:04     Performed at [facility]   Culture     Final   Value:        BLOOD CULTURE RECEIVED NO GROWTH TO DATE CULTURE WILL BE HELD FOR 5 DAYS BEFORE ISSUING A FINAL NEGATIVE REPORT     Performed at [facility]   Report Status PENDING   Incomplete   Studies/Results: Ct Chest W Contrast  10/05/2013   CLINICAL DATA:  Cirrhosis of the liver. Evaluate for esophageal varices.  EXAM: CT CHEST AND ABDOMEN WITH CONTRAST  IMPRESSION: CT chest:  1. Moderate bilateral  pleural effusions and interstitial edema consistent with CHF. 2. 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 3. No evidence for esophageal varices.  CT abdomen:  1. No acute findings within the abdomen or pelvis. 2. End-stage cystic kidneys. There are numerous bilateral renal cysts of varying complexity. 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 Urologic consultation is recommended. 3. Mild changes of cirrhosis. 4. Extensive atherosclerotic disease.   Electronically Signed   By: Signa Kell M.D.   On: 10/05/2013 14:08   Ct Abdomen W Contrast  10/05/2013   CLINICAL DATA:  Cirrhosis of the liver. Evaluate for esophageal varices.  EXAM: CT CHEST AND ABDOMEN WITH CONTRAST    IMPRESSION: CT chest:  1. Moderate bilateral pleural effusions and interstitial edema consistent with CHF. 2. 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 3. No evidence for esophageal varices.  CT abdomen:  1. No acute findings within the abdomen or pelvis. 2. End-stage cystic kidneys. There are numerous bilateral renal cysts of varying complexity. 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 Urologic consultation is recommended. 3. Mild changes of cirrhosis. 4. Extensive atherosclerotic disease.   Electronically Signed   By: Signa Kell M.D.   On: 10/05/2013 14:08   Medications: I have reviewed the patient's current medications. Scheduled Meds: . darbepoetin (ARANESP) injection - DIALYSIS  200 mcg Intravenous Q Tue-HD  . midodrine  15 mg Oral TID WC  . pantoprazole  40 mg Oral BID  . phenytoin (DILANTIN) IV  200 mg Intravenous QHS   Continuous Infusions:  PRN Meds:.ondansetron (ZOFRAN) IV Assessment/Plan:  Presumed Upper GI Bleeding: Presently stable. Source of bleeding- Not identified. Patient with past medical history of esophageal ulcers and  prior gastric ulcers. EGD performed by Dr. Elnoria Howard, revealed nonbleeding  esophageal ulcers without active bleeding. Patient has had 2 units of blood transfusion, between 12/14 to 12/15. His hemoglobin is currently stable at 8.9, for the past 2 days. Chest CT- Mild changes of Cirrrhosis, multiple kidney cysts, Bilat pleural effusions and interstitial edema- consistent with CHF, chronically sclerosed occluded SVC with extensive chest walls collats, no evid of oesophageal varices. Abd Ct- Multiple cysts in kidneys, Bosniak category 3 cyst, urology consultation recommended. - Gi recs appreciated, has signed off. - Cont Hb checks, daily. Hemoglobin Goal of 7.0.  - Out-pt follow up with Urology in the light of kidney findings on CT and recs. - Talked to pt's sister who says pt takes a regular diet, feeds himself, baths himself. - Pt/Ot eval and treat. - Resume renal diet, as no sign of active bleeding.  Bilat pleural effusions- Seen on CT. Likely due to fluid overload, ESRD on HD, or underlying liver disease- Cirrhosis. Pt asymptomatic. Unlikley infectious- as pt is not coughing or SOB, afebrile, and WBC- 5.8, 10/05/2013  WNL throughout admission. Unlikley malig- as effusions are bilat, no weightloss.  Hypotension: Due to acute blood loss- thought to be secondary to upper GI bleeding. Currently on midodrine po. Pt also on renal dialysis   -Will be cautious with IV fluids in the setting of hemodialysis.  -Continue with po midodrine.  -Monitor blood pressure closely   Seizure disorder: Stable without active seizures. Continue with IV Dilantin.   ESRD on hemodialysis: Being followed by the renal.    Dispo: Disposition is deferred at this time, awaiting improvement of current medical problems.  Anticipated discharge in approximately 1-2 day(s).   The patient does have a current PCP Linward Headland, MD) and does need an [facility] hospital follow-up appointment after discharge.  The patient does not know have transportation limitations that hinder transportation to clinic  appointments.  .Services Needed at time of discharge: Y = Yes, Blank = No PT:   OT:   RN:   Equipment:   Other:     LOS: 4 days   [name] Carina, MD 10/06/2013, 2:35 PM

## 2013-10-06 NOTE — Progress Notes (Signed)
MEDICATION RELATED CONSULT NOTE - Follow-up  Pharmacy Consult for Dilantin Indication: h/o seizures  No Known Allergies  Patient Measurements: Height: 5' 4.96" (165 cm) Weight: 132 lb 4.4 oz (60 kg) IBW/kg (Calculated) : 61.41 Adjusted Body Weight:   Vital Signs: Temp: 98.8 F (37.1 C) (12/18 0823) Temp src: Oral (12/18 0823) BP: 107/46 mmHg (12/18 0751) Pulse Rate: 90 (12/18 0751) Intake/Output from previous day: 12/17 0701 - 12/18 0700 In: 595.8 [I.V.:491.8; IV Piggyback:104] Out: 1898  Intake/Output from this shift:    Labs:  Recent Labs  10/04/13 0020  10/04/13 0500  10/04/13 1830 10/05/13 0200 10/05/13 1030 10/05/13 1210 10/05/13 2230  WBC  --   < >  --   < > 5.8 5.2 5.4  --  5.8  HGB  --   < >  --   < > 9.2* 8.8* 8.8*  --  8.9*  HCT  --   < >  --   < > 26.7* 24.9* 25.6*  --  26.3*  PLT  --   < >  --   < > 123* 124* 152  --  145*  CREATININE 8.27*  --  8.55*  --  4.06*  --   --  5.94*  --   PHOS 5.7*  --   --   --  3.7  --   --  5.0*  --   ALBUMIN 2.7*  --   --   --  2.9*  --   --  2.9*  --   < > = values in this interval not displayed. Estimated Creatinine Clearance: 10.7 ml/min (by C-G formula based on Cr of 5.94).   Microbiology: Recent Results (from the past 720 hour(s))  MRSA PCR SCREENING     Status: None   Collection Time    09/14/13 12:08 PM      Result Value Range Status   MRSA by PCR NEGATIVE  NEGATIVE Final   Comment:            The GeneXpert MRSA Assay (FDA     approved for NASAL specimens     only), is one component of a     comprehensive MRSA colonization     surveillance program. It is not     intended to diagnose MRSA     infection nor to guide or     monitor treatment for     MRSA infections.  MRSA PCR SCREENING     Status: None   Collection Time    10/02/13  7:16 AM      Result Value Range Status   MRSA by PCR NEGATIVE  NEGATIVE Final   Comment:            The GeneXpert MRSA Assay (FDA     approved for NASAL specimens   only), is one component of a     comprehensive MRSA colonization     surveillance program. It is not     intended to diagnose MRSA     infection nor to guide or     monitor treatment for     MRSA infections.  CULTURE, BLOOD (ROUTINE X 2)     Status: None   Collection Time    10/02/13 10:00 AM      Result Value Range Status   Specimen Description BLOOD RIGHT ARM   Final   Special Requests BOTTLES DRAWN AEROBIC AND ANAEROBIC 10CC   Final   Culture  Setup Time  Final   Value: 10/02/2013 17:04     Performed at [facility]   Culture     Final   Value:        BLOOD CULTURE RECEIVED NO GROWTH TO DATE CULTURE WILL BE HELD FOR 5 DAYS BEFORE ISSUING A FINAL NEGATIVE REPORT     Performed at [facility]   Report Status PENDING   Incomplete  CULTURE, BLOOD (ROUTINE X 2)     Status: None   Collection Time    10/02/13 10:28 AM      Result Value Range Status   Specimen Description BLOOD LEFT HAND   Final   Special Requests BOTTLES DRAWN AEROBIC AND ANAEROBIC 10CC   Final   Culture  Setup Time     Final   Value: 10/02/2013 17:04     Performed at [facility]   Culture     Final   Value:        BLOOD CULTURE RECEIVED NO GROWTH TO DATE CULTURE WILL BE HELD FOR 5 DAYS BEFORE ISSUING A FINAL NEGATIVE REPORT     Performed at [facility]   Report Status PENDING   Incomplete    Assessment:   62 yom presented to the hospital with possible GIB. He is chronic phenytoin for seizures. He had missed a dose prior to admission so his home dose was restarted. We are holding off on a level until pt is back at steady state after 5 days. No seizure activity note.   Goal of Therapy:  Dilantin level 10-20  Plan:  1. Continue phenytoin 200mg  QHS 2. F/u seizure activity 3. Will obtain a phenytoin level over the weekend at steady state  Lysle Pearl, PharmD, BCPS Pager # [phone] 10/06/2013 9:48 AM

## 2013-10-06 NOTE — Progress Notes (Signed)
[name] came to CT for a scheduled CAT scan of chest, abdomen.  I got his paperwork, with the CT Chest request on top. Unbeknownst to me, the triage technologist had spoken to Dr Elnoria Howard, and Dr Elnoria Howard had specifically requested a pelvis NOT be done.( typically, protocol is to do a pelvis when abdominal scan is done.) I found this out when I went to complete pts. exams. The pt. was not  charged for the exam.[facility] Portal event # [phone].

## 2013-10-06 NOTE — Progress Notes (Signed)
Internal Medicine Attending  Date: 10/06/2013  Patient name: [name] Medical record number: [id number] Date of birth: Aug 19, 1951 Age: 62 y.o. Gender: male  I saw and evaluated the patient and discussed his care on a.m. rounds with house staff. I reviewed the resident's note by Dr. Mariea Clonts and I agree with the resident's findings and plans as documented in her note.

## 2013-10-06 NOTE — Progress Notes (Signed)
Hemodialysis-See Flowsheet BP low. Pt refusing to take midodrine. 69/30 uf off and saline given x 300cc total. Unable to achieve BP over 70. Mariea Clonts PA at bedside. Attempted to get pt to take midodrine as well with no results. Order to decrease goal to 2L and continue to keep bp over 70 as ordered by Dr. Arlean Hopping. Current bp 80/48 HR 106 and pt is alert. Continue to monitor

## 2013-10-06 NOTE — Progress Notes (Signed)
Utilization review completed.  

## 2013-10-07 LAB — GLUCOSE, CAPILLARY: Glucose-Capillary: 69 mg/dL — ABNORMAL LOW (ref 70–99)

## 2013-10-07 MED ORDER — SODIUM CHLORIDE 0.9 % IV SOLN: 100.0000 mL | INTRAVENOUS | Status: DC | PRN

## 2013-10-07 MED ORDER — PHENYTOIN 50 MG PO CHEW: 200.0000 mg | CHEWABLE_TABLET | Freq: Every day | ORAL | Status: DC

## 2013-10-07 MED ORDER — PENTAFLUOROPROP-TETRAFLUOROETH EX AERO: 1.0000 "application " | INHALATION_SPRAY | CUTANEOUS | Status: DC | PRN

## 2013-10-07 MED ORDER — HEPARIN SODIUM (PORCINE) 1000 UNIT/ML DIALYSIS: 1000.0000 [IU] | INTRAMUSCULAR | Status: DC | PRN

## 2013-10-07 MED ORDER — PHENYTOIN SODIUM 50 MG/ML IJ SOLN
200.0000 mg | Freq: Once | INTRAMUSCULAR | Status: AC
  Administered 2013-10-08: 200 mg via INTRAVENOUS
  Filled 2013-10-07 (×2): qty 4

## 2013-10-07 MED ORDER — PANTOPRAZOLE SODIUM 40 MG IV SOLR
40.0000 mg | Freq: Once | INTRAVENOUS | Status: AC
  Administered 2013-10-08: 40 mg via INTRAVENOUS
  Filled 2013-10-07 (×2): qty 40

## 2013-10-07 MED ORDER — LIDOCAINE HCL (PF) 1 % IJ SOLN: 5.0000 mL | INTRAMUSCULAR | Status: DC | PRN

## 2013-10-07 MED ORDER — PHENYTOIN 50 MG PO CHEW
200.0000 mg | CHEWABLE_TABLET | Freq: Every day | ORAL | Status: DC
  Filled 2013-10-07 (×2): qty 4

## 2013-10-07 MED ORDER — ALTEPLASE 2 MG IJ SOLR: 2.0000 mg | Freq: Once | INTRAMUSCULAR | Status: AC | PRN

## 2013-10-07 MED ORDER — NEPRO/CARBSTEADY PO LIQD: 237.0000 mL | ORAL | Status: DC | PRN

## 2013-10-07 MED ORDER — PHENYTOIN SODIUM EXTENDED 100 MG PO CAPS: 200.0000 mg | ORAL_CAPSULE | Freq: Every day | ORAL | Status: DC

## 2013-10-07 MED ORDER — LIDOCAINE-PRILOCAINE 2.5-2.5 % EX CREA: 1.0000 "application " | TOPICAL_CREAM | CUTANEOUS | Status: DC | PRN

## 2013-10-07 MED ORDER — ACETAMINOPHEN 325 MG PO TABS: 650.0000 mg | ORAL_TABLET | Freq: Once | ORAL | Status: DC

## 2013-10-07 NOTE — Progress Notes (Signed)
Pt refusing all meds even with assistance from sister, pt will not take anything PO and no current IV access (MD aware). Pt is awake and alert, will continue to attempt to get pt to take meds and monitor BP closely.

## 2013-10-07 NOTE — Progress Notes (Signed)
Subjective: Lying in bed comfortably, watching TV, not in any distress. Appears to nod his head in response to questions.  Objective: Vital signs in last 24 hours: Filed Vitals:   10/07/13 1000 10/07/13 1100 10/07/13 1200 10/07/13 1220  BP: 105/49 107/52    Pulse: 97 101    Temp:   98.9 F (37.2 C) 98.9 F (37.2 C)  TempSrc:   Oral Oral  Resp: 23 25    Height:      Weight:      SpO2:    97%   Weight change: -9 lb 7.7 oz (-4.3 kg)  Intake/Output Summary (Last 24 hours) at 10/07/13 1405 Last data filed at 10/06/13 1857  Gross per 24 hour  Intake      0 ml  Output   1035 ml  Net  -1035 ml   Physical exam-  General appearance: NAD, alert, appears stated age and doesn't appear to be in any distress, mute. Lungs: Clear to auscultation bilaterally, no crackles or rubs. Heart: regular rate and rhythm, S1, S2 normal, no murmur, click, rub or gallop Abdomen: soft, non-tender; bowel sounds normal; no masses,  no organomegaly Extremities: extremities normal, atraumatic, no cyanosis or edema Pulses: 2+ and symmetric, no pedal edema.  Lab Results: Basic Metabolic Panel:  Recent Labs Lab 10/05/13 1210 10/06/13 1514  NA 139 140  K 3.8 3.7  CL 98 98  CO2 21 23  GLUCOSE 67* 65*  BUN 24* 19  CREATININE 5.94* 5.62*  CALCIUM 8.9 9.7  PHOS 5.0* 4.8*   Liver Function Tests:  Recent Labs Lab 10/02/13 0305  10/05/13 1210 10/06/13 1514  AST 15  --   --   --   ALT 15  --   --   --   ALKPHOS 134*  --   --   --   BILITOT 0.2*  --   --   --   PROT 7.2  --   --   --   ALBUMIN 3.8  < > 2.9* 3.3*  < > = values in this interval not displayed.  Recent Labs Lab 10/02/13 0305  LIPASE 56    Recent Labs Lab 10/02/13 0305  AMMONIA 28   CBC:  Recent Labs Lab 10/02/13 0305  10/05/13 2230 10/06/13 1400  WBC 4.0  < > 5.8 6.2  NEUTROABS 2.7  --   --   --   HGB 8.7*  < > 8.9* 9.4*  HCT 26.8*  < > 26.3* 28.0*  MCV 98.9  < > 93.3 94.9  PLT 175  < > 145* 158  < > =  values in this interval not displayed. CBG:  Recent Labs Lab 10/02/13 0726 10/07/13 0823  GLUCAP 147* 69*   Coagulation:  Recent Labs Lab 10/02/13 0305  LABPROT 14.3  INR 1.13   Micro Results: Recent Results (from the past 240 hour(s))  MRSA PCR SCREENING     Status: None   Collection Time    10/02/13  7:16 AM      Result Value Range Status   MRSA by PCR NEGATIVE  NEGATIVE Final   Comment:            The GeneXpert MRSA Assay (FDA     approved for NASAL specimens     only), is one component of a     comprehensive MRSA colonization     surveillance program. It is not     intended to diagnose MRSA     infection  nor to guide or     monitor treatment for     MRSA infections.  CULTURE, BLOOD (ROUTINE X 2)     Status: None   Collection Time    10/02/13 10:00 AM      Result Value Range Status   Specimen Description BLOOD RIGHT ARM   Final   Special Requests BOTTLES DRAWN AEROBIC AND ANAEROBIC 10CC   Final   Culture  Setup Time     Final   Value: 10/02/2013 17:04     Performed at [facility]   Culture     Final   Value:        BLOOD CULTURE RECEIVED NO GROWTH TO DATE CULTURE WILL BE HELD FOR 5 DAYS BEFORE ISSUING A FINAL NEGATIVE REPORT     Performed at [facility]   Report Status PENDING   Incomplete  CULTURE, BLOOD (ROUTINE X 2)     Status: None   Collection Time    10/02/13 10:28 AM      Result Value Range Status   Specimen Description BLOOD LEFT HAND   Final   Special Requests BOTTLES DRAWN AEROBIC AND ANAEROBIC 10CC   Final   Culture  Setup Time     Final   Value: 10/02/2013 17:04     Performed at [facility]   Culture     Final   Value:        BLOOD CULTURE RECEIVED NO GROWTH TO DATE CULTURE WILL BE HELD FOR 5 DAYS BEFORE ISSUING A FINAL NEGATIVE REPORT     Performed at [facility]   Report Status PENDING   Incomplete   Studies/Results: Ct Chest W Contrast  10/05/2013   CLINICAL DATA:  Cirrhosis of the liver. Evaluate  for esophageal varices.  EXAM: CT CHEST AND ABDOMEN WITH CONTRAST  IMPRESSION: CT chest:  1. Moderate bilateral pleural effusions and interstitial edema consistent with CHF. 2. 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 3. No evidence for esophageal varices.  CT abdomen:  1. No acute findings within the abdomen or pelvis. 2. End-stage cystic kidneys. There are numerous bilateral renal cysts of varying complexity. 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 Urologic consultation is recommended. 3. Mild changes of cirrhosis. 4. Extensive atherosclerotic disease.   Electronically Signed   By: Signa Kell M.D.   On: 10/05/2013 14:08   Ct Abdomen W Contrast  10/05/2013   CLINICAL DATA:  Cirrhosis of the liver. Evaluate for esophageal varices.  EXAM: CT CHEST AND ABDOMEN WITH CONTRAST    IMPRESSION: CT chest:  1. Moderate bilateral pleural effusions and interstitial edema consistent with CHF. 2. 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 3. No evidence for esophageal varices.  CT abdomen:  1. No acute findings within the abdomen or pelvis. 2. End-stage cystic kidneys. There are numerous bilateral renal cysts of varying complexity. 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 Urologic consultation is recommended. 3. Mild changes of cirrhosis. 4. Extensive atherosclerotic disease.   Electronically Signed   By: Signa Kell M.D.   On: 10/05/2013 14:08   Medications: I have reviewed the patient's current medications. Scheduled Meds: . acetaminophen  650 mg Oral Once  . darbepoetin (ARANESP) injection - DIALYSIS  200 mcg Intravenous Q Tue-HD  . midodrine  15 mg Oral TID WC  . pantoprazole  40 mg Oral BID  . phenytoin  200 mg  Oral QHS   Continuous Infusions:  PRN Meds:.ondansetron (ZOFRAN) IV Assessment/Plan:  Presumed Upper GI Bleeding: Presently stable. Source of bleeding- Not identified.  Patient with past medical history of esophageal ulcers and prior gastric ulcers. EGD performed by Dr. Elnoria Howard, revealed nonbleeding esophageal ulcers without active bleeding. Patient has had 2 units of blood transfusion, between 12/14 to 12/15. His hemoglobin is 9.4 today, stable for the past 3 days. Chest CT- Mild changes of Cirrrhosis, multiple kidney cysts, Bilat pleural effusions and interstitial edema- consistent with CHF, chronically sclerosed occluded SVC with extensive chest walls collats, no evid of oesophageal varices. Abd Ct- Multiple cysts in kidneys, Bosniak category 3 cyst, urology consultation recommended. Pt refusing to take anything orally, family sometimes can encourage him to take his meds. Family has not been by today. - Gi recs appreciated, has signed off. - Cont Hb checks, daily. Hemoglobin Goal of 7.0.  - Out-pt follow up with Urology in the light of kidney findings on CT and recs. - Talked to pt's sister who says pt takes a regular diet, feeds himself, baths himself. - Pt/Ot eval and treat. - Resume renal diet, as no sign of active bleeding. - Transfer to Med- Surg- Tele.   Bilat pleural effusions- Seen on CT. Likely due to fluid overload, ESRD on HD, or underlying liver disease- Cirrhosis. Pt asymptomatic. Unlikley infectious- as pt is not coughing or SOB, afebrile throughout admisssion, and WBC- 5.8, 10/05/2013  WNL throughout admission. Unlikley malig- as effusions are bilat, no weightloss.  Hypotension: Due to acute blood loss- thought to be secondary to upper GI bleeding. Currently on midodrine po. Pt also on renal dialysis.    -Will be cautious with IV fluids in the setting of hemodialysis.  -Continue with po midodrine, currently refusing. Options limited.  -Monitor blood pressure closely   Seizure disorder: Stable without active seizures. Continue with IV Dilantin.   ESRD on hemodialysis: Being followed by the renal. HD- [facility].  Dispo: Disposition is deferred at this  time, awaiting improvement of current medical problems.  Anticipated discharge in approximately 1-2 day(s).   The patient does have a current PCP Linward Headland, MD) and does need an [facility] hospital follow-up appointment after discharge.  The patient does not know have transportation limitations that hinder transportation to clinic appointments.  .Services Needed at time of discharge: Y = Yes, Blank = No PT:   OT:   RN:   Equipment:   Other:     LOS: 5 days   Kennis Carina, MD 10/07/2013, 2:05 PM

## 2013-10-07 NOTE — Progress Notes (Signed)
Subjective:  [place], appears at baseline, indicates no complaints; however, pt refuses to eat or take any oral meds.  Objective: Vital signs in last 24 hours: Temp:  [97.6 F (36.4 C)-98.9 F (37.2 C)] 97.7 F (36.5 C) (12/19 0800) Pulse Rate:  [94-109] 101 (12/19 1100) Resp:  [15-27] 25 (12/19 1100) BP: (74-112)/(30-58) 107/52 mmHg (12/19 1100) SpO2:  [92 %-100 %] 92 % (12/19 0800) Weight:  [55.7 kg (122 lb 12.7 oz)] 55.7 kg (122 lb 12.7 oz) (12/18 1857) Weight change: -4.3 kg (-9 lb 7.7 oz)  Intake/Output from previous day: 12/18 0701 - 12/19 0700 In: -  Out: 1035   Lab Results:  Recent Labs  10/05/13 2230 10/06/13 1400  WBC 5.8 6.2  HGB 8.9* 9.4*  HCT 26.3* 28.0*  PLT 145* 158   BMET:  Recent Labs  10/05/13 1210 10/06/13 1514  NA 139 140  K 3.8 3.7  CL 98 98  CO2 21 23  GLUCOSE 67* 65*  BUN 24* 19  CREATININE 5.94* 5.62*  CALCIUM 8.9 9.7  ALBUMIN 2.9* 3.3*   No results found for this basename: PTH,  in the last 72 hours Iron Studies: No results found for this basename: IRON, TIBC, TRANSFERRIN, FERRITIN,  in the last 72 hours  EXAM: General appearance:  Alert, in no apparent distress  Resp:  CTA without rales, rhonchi, or wheezes Cardio:  RRR without murmur or rub GI:  + BS, soft and nontender Extremities:  No edema Access:  L thigh AVG with + bruit  Dialysis: [facility] TTS  3:45hrs 2K/2Ca bath 56kg Heparin none L thigh AVG 400/800  Hectorol 12 Epogen 15000 Venofer 50 weekly   Assessment/Plan: 1. Hematemesis - resolved, source of bleeding unidentified per GI; mild cirrhosis per CT, but no varices. 2. ESRD - HD on TTS @ [facility]; K 3.7.  HD tomorrow. 3. Hypotension/Volume - BP 107/52 on Midodrine; CHF per CXR 12/17, wt 55.7 kg s/p net UF 1 L yesterday. 4. Anemia - Hgb 9.4 on Aranesp 200 mcg on Tues. 5. Sec HPT - Ca 9.7 (10.3 corrected), P 4.8; no Hectorol, resume Renvela when eating.  6. Nutrition - renal diet and vitamin.   7. HD access - outpatient  shuntogram scheduled for declining access flows, sedation required. 8. 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   LOS: 5 days   LYLES,CHARLES 10/07/2013,11:58 AM  I have seen and examined patient, discussed with PA and agree with assessment and plan as outlined above.  Patient refusing meds including midodrine and refusing to eat per staff as well.  Plan HD tomorrow as tolerated.  Vinson Moselle MD pager [phone]    cell [phone] 10/07/2013, 1:34 PM

## 2013-10-07 NOTE — Progress Notes (Signed)
Internal Medicine Attending  Date: 10/07/2013  Patient name: [name] Medical record number: [id number] Date of birth: 10/17/1951 Age: 63 y.o. Gender: male  I saw and evaluated the patient and discussed his care on a.m. rounds with house staff. I reviewed the resident's note by Dr. Mariea Clonts and I agree with the resident's findings and plans as documented in her note.

## 2013-10-07 NOTE — Progress Notes (Signed)
OT Cancellation Note  Patient Details Name: [name] MRN: [id number] DOB: 12/14/1950   Cancelled Treatment:    Reason Eval/Treat Not Completed: Other (comment) Attempted to get pt to participate with assistance of nephew. Pt not participating at this time. Discussed home situation with sister Wille Celeste). Pt lives with caregiver in 2 story home with bedrooms upstairs. Pt was independent with mobility and dressed himself. Has a shower seat and family assists him with bathing. Encouraged nephew to have his uncle walk with him and staff. Pt likes puzzles. Will attempt again later date.  [facility] Jovanni Rash, OTR/L  [phone] 10/07/2013 10/07/2013, 5:20 PM

## 2013-10-07 NOTE — Progress Notes (Addendum)
PT Cancellation Note  Patient Details Name: JOHNNEY SCARLATA MRN: [id number] DOB: March 20, 1951   Cancelled Treatment:    Reason Eval/Treat Not Completed: Patient declined, no reason specified; pt seen with nephew present and still adamantly refusing to mobilize or cooperate with evaluation. Will attempt again tomorrow.   Dagmar Adcox,CYNDI 10/07/2013, 4:22 PM

## 2013-10-08 LAB — GLUCOSE, CAPILLARY
Glucose-Capillary: 59 mg/dL — ABNORMAL LOW (ref 70–99)
Glucose-Capillary: 63 mg/dL — ABNORMAL LOW (ref 70–99)
Glucose-Capillary: 111 mg/dL — ABNORMAL HIGH (ref 70–99)

## 2013-10-08 LAB — CULTURE, BLOOD (ROUTINE X 2)

## 2013-10-08 MED ORDER — DEXTROSE 50 % IV SOLN
25.0000 mL | Freq: Once | INTRAVENOUS | Status: AC
  Administered 2013-10-08: 25 mL via INTRAVENOUS
  Filled 2013-10-08: qty 50

## 2013-10-08 NOTE — Progress Notes (Signed)
Subjective: Lying in bed, not in any distress. Still refusing oral meds and food. Blood sugars dropped- 59 an d69. Refused orange juice.  Objective: Vital signs in last 24 hours: Filed Vitals:   10/08/13 1705 10/08/13 1709 10/08/13 1716 10/08/13 1720  BP: 86/46 87/48 101/53 104/59  Pulse: 92 93 92 95  Temp:    97.8 F (36.6 C)  TempSrc:    Oral  Resp: 17 18 15 21   Height:      Weight:    115 lb 15.4 oz (52.6 kg)  SpO2:    98%   Weight change: -3 lb 3.1 oz (-1.45 kg)  Intake/Output Summary (Last 24 hours) at 10/08/13 1744 Last data filed at 10/08/13 1720  Gross per 24 hour  Intake      0 ml  Output   1447 ml  Net  -1447 ml   Physical exam-  General appearance: alert, appears stated age and doesn't appear to be in any distress, mute. Lungs:  no crackles or rubs. Heart: regular rate and rhythm, S1, S2 normal, no murmur, click, rub or gallop Abdomen: soft, non-tender; bowel sounds normal; no masses,  no organomegaly Extremities: extremities normal, atraumatic, no cyanosis or edema Pulses: 2+ and symmetric, no pedal edema.  Lab Results: Basic Metabolic Panel:  Recent Labs Lab 10/05/13 1210 10/06/13 1514  NA 139 140  K 3.8 3.7  CL 98 98  CO2 21 23  GLUCOSE 67* 65*  BUN 24* 19  CREATININE 5.94* 5.62*  CALCIUM 8.9 9.7  PHOS 5.0* 4.8*   Liver Function Tests:  Recent Labs Lab 10/02/13 0305  10/05/13 1210 10/06/13 1514  AST 15  --   --   --   ALT 15  --   --   --   ALKPHOS 134*  --   --   --   BILITOT 0.2*  --   --   --   PROT 7.2  --   --   --   ALBUMIN 3.8  < > 2.9* 3.3*  < > = values in this interval not displayed.  Recent Labs Lab 10/02/13 0305  LIPASE 56    Recent Labs Lab 10/02/13 0305  AMMONIA 28   CBC:  Recent Labs Lab 10/02/13 0305  10/05/13 2230 10/06/13 1400  WBC 4.0  < > 5.8 6.2  NEUTROABS 2.7  --   --   --   HGB 8.7*  < > 8.9* 9.4*  HCT 26.8*  < > 26.3* 28.0*  MCV 98.9  < > 93.3 94.9  PLT 175  < > 145* 158  < > = values  in this interval not displayed. CBG:  Recent Labs Lab 10/02/13 0726 10/07/13 0823 10/08/13 1044 10/08/13 1207 10/08/13 1513  GLUCAP 147* 69* 59* 63* 111*   Coagulation:  Recent Labs Lab 10/02/13 0305  LABPROT 14.3  INR 1.13   Micro Results: Recent Results (from the past 240 hour(s))  MRSA PCR SCREENING     Status: None   Collection Time    10/02/13  7:16 AM      Result Value Range Status   MRSA by PCR NEGATIVE  NEGATIVE Final   Comment:            The GeneXpert MRSA Assay (FDA     approved for NASAL specimens     only), is one component of a     comprehensive MRSA colonization     surveillance program. It is not  intended to diagnose MRSA     infection nor to guide or     monitor treatment for     MRSA infections.  CULTURE, BLOOD (ROUTINE X 2)     Status: None   Collection Time    10/02/13 10:00 AM      Result Value Range Status   Specimen Description BLOOD RIGHT ARM   Final   Special Requests BOTTLES DRAWN AEROBIC AND ANAEROBIC 10CC   Final   Culture  Setup Time     Final   Value: 10/02/2013 17:04     Performed at [facility]   Culture     Final   Value: NO GROWTH 5 DAYS     Performed at [facility]   Report Status 10/08/2013 FINAL   Final  CULTURE, BLOOD (ROUTINE X 2)     Status: None   Collection Time    10/02/13 10:28 AM      Result Value Range Status   Specimen Description BLOOD LEFT HAND   Final   Special Requests BOTTLES DRAWN AEROBIC AND ANAEROBIC 10CC   Final   Culture  Setup Time     Final   Value: 10/02/2013 17:04     Performed at [facility]   Culture     Final   Value: NO GROWTH 5 DAYS     Performed at [facility]   Report Status 10/08/2013 FINAL   Final   Studies/Results: Ct Chest W Contrast  10/05/2013   CLINICAL DATA:  Cirrhosis of the liver. Evaluate for esophageal varices.  EXAM: CT CHEST AND ABDOMEN WITH CONTRAST  IMPRESSION: CT chest:  1. Moderate bilateral pleural effusions and interstitial  edema consistent with CHF. 2. 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 3. No evidence for esophageal varices.  CT abdomen:  1. No acute findings within the abdomen or pelvis. 2. End-stage cystic kidneys. There are numerous bilateral renal cysts of varying complexity. 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 Urologic consultation is recommended. 3. Mild changes of cirrhosis. 4. Extensive atherosclerotic disease.   Electronically Signed   By: Signa Kell M.D.   On: 10/05/2013 14:08   Ct Abdomen W Contrast  10/05/2013   CLINICAL DATA:  Cirrhosis of the liver. Evaluate for esophageal varices.  EXAM: CT CHEST AND ABDOMEN WITH CONTRAST    IMPRESSION: CT chest:  1. Moderate bilateral pleural effusions and interstitial edema consistent with CHF. 2. 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 3. No evidence for esophageal varices.  CT abdomen:  1. No acute findings within the abdomen or pelvis. 2. End-stage cystic kidneys. There are numerous bilateral renal cysts of varying complexity. 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 Urologic consultation is recommended. 3. Mild changes of cirrhosis. 4. Extensive atherosclerotic disease.   Electronically Signed   By: Signa Kell M.D.   On: 10/05/2013 14:08   Medications: I have reviewed the patient's current medications. Scheduled Meds: . acetaminophen  650 mg Oral Once  . darbepoetin (ARANESP) injection - DIALYSIS  200 mcg Intravenous Q Tue-HD  . midodrine  15 mg Oral TID WC  . pantoprazole  40 mg Oral BID  . phenytoin (DILANTIN) IV  200 mg Intravenous Once  . phenytoin  200 mg Oral QHS  . [START ON 10/09/2013] phenytoin  200 mg Oral QHS   Continuous Infusions:  PRN Meds:.sodium chloride, sodium chloride, feeding supplement (  NEPRO CARB STEADY), heparin, lidocaine (PF), lidocaine-prilocaine, ondansetron (ZOFRAN) IV,  pentafluoroprop-tetrafluoroeth Assessment/Plan:  Presumed Upper GI Bleeding: Presently stable. Source of bleeding- Not identified. Patient with past medical history of esophageal ulcers and prior gastric ulcers. EGD performed by Dr. Elnoria Howard, revealed nonbleeding esophageal ulcers without active bleeding. Patient has had 2 units of blood transfusion, between 12/14 to 12/15. His hemoglobin stable for the past 4 days. Chest CT- Mild changes of Cirrrhosis, multiple kidney cysts, Bilat pleural effusions and interstitial edema- consistent with CHF, chronically sclerosed occluded SVC with extensive chest walls collats, no evid of oesophageal varices. Abd Ct- Multiple cysts in kidneys, Bosniak category 3 cyst, urology consultation recommended. Pt refusing to take anything orally, family sometimes can encourage him to take his meds. Family coming to see pt today. - Gi recs appreciated, has signed off. - Cont Hb checks, daily. Hemoglobin Goal of 7.0.  - Out-pt follow up with Urology in the light of kidney findings on CT and recs. - Talked to pt's sister who says pt takes a regular diet, feeds himself, baths himself. - Pt/Ot eval and treat- Pt not co-operative. - Resume renal diet, as no sign of active bleeding. - Transfer to Med- Surg- Tele.  - Discharge home today, after dialysis. Called nephrologist about giving pt meds- IV protonix, Iv dilantin, and 25mg  of D50. Nephrologist was ok with this.  - Will be called for followup visit in our clinic.  Bilat pleural effusions- Seen on CT. Likely due to fluid overload, ESRD on HD, or underlying liver disease- Cirrhosis. Pt asymptomatic. Unlikley infectious- as pt is not coughing or SOB, afebrile throughout admisssion, and WBC- 5.8, 10/05/2013  WNL throughout admission. Unlikley malig- as effusions are bilat, no weightloss.  Hypotension: Due to acute blood loss- thought to be secondary to upper GI bleeding. Currently on midodrine po. Pt also on renal dialysis.    -Will  be cautious with IV fluids in the setting of hemodialysis.  -Continue with po midodrine, currently refusing. Options limited.  -Monitor blood pressure closely   Seizure disorder: Stable without active seizures. Continue with IV Dilantin.   ESRD on hemodialysis: Being followed by the renal. HD- [facility]S.  Dispo: Disposition is deferred at this time, awaiting improvement of current medical problems.  Anticipated discharge in approximately 1-2 day(s).   The patient does have a current PCP Linward Headland, MD) and does need an [facility] hospital follow-up appointment after discharge.  The patient does not know have transportation limitations that hinder transportation to clinic appointments.  .Services Needed at time of discharge: Y = Yes, Blank = No PT:   OT:   RN:   Equipment:   Other:     LOS: 6 days   Kennis Carina, MD 10/08/2013, 5:44 PM

## 2013-10-08 NOTE — Progress Notes (Signed)
CBG: 59  Treatment: Patient refuses all oral hypoglycemic treatments and patient does not have IV access.  Patient's sister was contacted and tried to convince patient over the phone to eat or drink something, but the patient continues to refuse.  Patient's sister states that he other sister is on her way to the hospital and should arrive within the next hour and she may be successful in getting her brother to eat/drink. M.D. Notified that patient refusing treatment. Will continue to encourage PO intake by patient.  1230: Patient received dose of IV dextrose during dialysis. See MAR.

## 2013-10-08 NOTE — Progress Notes (Signed)
Patient discharged to home with sister and brother-in-law. Patient AVS reviewed with patient's family. Family verbalized understanding of medications and follow-up appointments.  Patient remains stable; no signs or symptoms of distress.  Pt's family educated to return to the ER in cases of pt having SOB, dizziness, fever, chest pain, or fainting.

## 2013-10-08 NOTE — Progress Notes (Signed)
PT Cancellation Note  Patient Details Name: PHILIPPE GANG MRN: [id number] DOB: 07/04/1951   Cancelled Treatment:      pt off floor at hemodialysis. Will re-attempt to evaluate pt at next available time.    Donnamarie Poag B and E, [place] [phone] 10/08/2013, 1:57 PM

## 2013-10-08 NOTE — Progress Notes (Signed)
Subjective:  No complaints  Objective: Vital signs in last 24 hours: Temp:  [97.7 F (36.5 C)-98.9 F (37.2 C)] 98.3 F (36.8 C) (12/20 0523) Pulse Rate:  [88-103] 92 (12/20 0523) Resp:  [17-26] 18 (12/20 0523) BP: (90-117)/(41-57) 105/51 mmHg (12/20 0523) SpO2:  [92 %-99 %] 99 % (12/20 0523) Weight:  [54.25 kg (119 lb 9.6 oz)] 54.25 kg (119 lb 9.6 oz) (12/20 0440) Weight change: -1.45 kg (-3 lb 3.1 oz)     Lab Results:  Recent Labs  10/05/13 2230 10/06/13 1400  WBC 5.8 6.2  HGB 8.9* 9.4*  HCT 26.3* 28.0*  PLT 145* 158   BMET:  Recent Labs  10/05/13 1210 10/06/13 1514  NA 139 140  K 3.8 3.7  CL 98 98  CO2 21 23  GLUCOSE 67* 65*  BUN 24* 19  CREATININE 5.94* 5.62*  CALCIUM 8.9 9.7  ALBUMIN 2.9* 3.3*   No results found for this basename: PTH,  in the last 72 hours Iron Studies: No results found for this basename: IRON, TIBC, TRANSFERRIN, FERRITIN,  in the last 72 hours  EXAM:  General appearance: Alert, in no apparent distress  Resp: CTA without rales, rhonchi, or wheezes  Cardio: RRR without murmur or rub  GI: + BS, soft and nontender  Extremities: No edema  Access: L thigh AVG with + bruit   Dialysis: [facility] TTS  3:45hrs 2K/2Ca bath 56kg Heparin none L thigh AVG 400/800  Hectorol 12 Epogen 15000 Venofer 50 weekly   Assessment/Plan: 1. Hematemesis - resolved, source of bleeding unidentified per GI; mild cirrhosis by CT but no varices 2. ESRD - HD on TTS @ [facility]; K 3.7. HD pending. 3. Hypotension/Volume - BP 105/51 on Midodrine; CHF per CXR 12/17, wt 54.3 kg, below EDW. 4. Anemia - Hgb 9.4 on Aranesp 200 mcg on Tues. 5. Sec HPT - Ca 9.7 (10.3 corrected), P 4.8; no Hectorol, resume Renvela when eating.  6. Nutrition - renal diet and vitamin.  7. HD access - outpatient shuntogram scheduled for declining access flows, sedation required. 8. 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 Dispo: have d/w primary MD, pt going home today after HD     LOS: 6 days    LYLES,CHARLES 10/08/2013,7:24 AM  I have seen and examined patient, discussed with PA and agree with assessment and plan as outlined above. Vinson Moselle MD pager [phone]    cell [phone] 10/08/2013, 12:24 PM

## 2013-10-08 NOTE — Procedures (Signed)
I was present at this dialysis session, have reviewed the session itself and made  appropriate changes  Vinson Moselle MD (pgr) [phone]    [phone] 10/08/2013, 2:32 PM  d

## 2013-10-09 NOTE — Discharge Summary (Signed)
Name: [name] MRN: [id number] DOB: 1951-01-08 62 y.o. PCP: Linward Headland, MD  Date of Admission: 10/02/2013  2:21 AM Date of Discharge: 10/08/2013 Attending Physician: No att. providers found  Discharge Diagnosis: Principal Problem:   GI bleed Active Problems:   HYPERLIPIDEMIA   ANEMIA-IRON DEFICIENCY   MENTAL RETARDATION   HYPOTENSION   SUPERIOR VENA CAVA SYNDROME   GASTRIC ULCER   ESRD (end stage renal disease)   MUTE   Atherosclerosis of native arteries of the extremities with ulceration(440.23)   Seizure   Generalized convulsive epilepsy without mention of intractable epilepsy   Esophageal erosions   Gastric erosion (at antrum)   GIB (gastrointestinal bleeding)  Discharge Medications:   Medication List         acetaminophen 325 MG tablet  Commonly known as:  TYLENOL  Take 2 tablets (650 mg total) by mouth every 6 (six) hours as needed for moderate pain.     atorvastatin 20 MG tablet  Commonly known as:  LIPITOR  Take 1 tablet (20 mg total) by mouth daily.     clonazePAM 0.5 MG tablet  Commonly known as:  KLONOPIN  Take 0.5 tablets (0.25 mg total) by mouth 2 (two) times daily.     folic acid-vitamin b complex-vitamin c-selenium-zinc 3 MG Tabs tablet  Take 1 tablet by mouth daily.     midodrine 10 MG tablet  Commonly known as:  PROAMATINE  Take 15 mg by mouth 3 (three) times daily.     pantoprazole 40 MG tablet  Commonly known as:  PROTONIX  Take 1 tablet (40 mg total) by mouth 2 (two) times daily.     phenytoin 100 MG ER capsule  Commonly known as:  DILANTIN  Take 2 capsules (200 mg total) by mouth at bedtime.     sevelamer carbonate 800 MG tablet  Commonly known as:  RENVELA  Take 800 mg by mouth 3 (three) times daily with meals.        Disposition and follow-up:   Mr.Ryman W Meikle was discharged from [facility] in Stable condition.  At the hospital follow up visit please address:  1.  Follow up on Patients CBC, to  ensure Hb is stable and pt is not bleeding. Patient now taking all his medications- including Dilantin and protonix and is taking his meals, as he refused to eat or take meds prior to discharge. Any melana stools since discharge.  2. 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 Urologic consultation was recommended. Out-pt refferal to Urology is required to rule out malignacy.  3. Chest CT showed bilateral pleural effusions thought to be due to fluid overload- Follow up as an out-pt if patient becomes symptomatic.  Follow-up Appointments: With [place] in our clinic- 10/25/2012  Discharge Instructions: Discharge Orders   Future Appointments Provider Department Dept Phone   10/25/2013 9:00 AM Belia Heman, MD [facility] [phone]   11/18/2013 10:00 AM Nilda Riggs, NP [facility] [phone]   Future Orders Complete By Expires   Diet - low sodium heart healthy  As directed    Diet - low sodium heart healthy  As directed    Discharge instructions  As directed    Comments:     We will be discharging you home to continue with your previous medication. Please also take Protonix- 40mg   twice a day, this is because of the bleeding from your stomach.  We will like to see you in clinic after the holiday. We will call you to set up an appointment. We will like to check your CBC to be sure that your blood levels are not continuing to drop.  Please if  pts care providers are having problems convincing patient to eat and take his medications when he is back home, pt should be brought back to the hospital.   Increase activity slowly  As directed    Increase activity slowly  As directed       Consultations: Treatment Team:  Maree Krabbe, MD  Procedures Performed:  Ct Chest W Contrast  10/05/2013   CLINICAL DATA:   Cirrhosis of the liver. Evaluate for esophageal varices.  EXAM: CT CHEST AND ABDOMEN WITH CONTRAST  TECHNIQUE: Multidetector CT imaging of the chest and abdomen was performed following the standard protocol during bolus administration of intravenous contrast.  CONTRAST:  80mL OMNIPAQUE IOHEXOL 300 MG/ML  SOLN  COMPARISON:  None  FINDINGS: CT CHEST FINDINGS  There are moderate bilateral pleural effusions identified. Mild diffuse interstitial edema is identified. Compressive type atelectasis is present in the lower lobes overlying both effusions.  The trachea appears patent. Normal heart size. No pericardial effusion. The stress set calcified atherosclerotic disease affects the thoracic aorta. Right paratracheal lymph node measures 1.2 cm. No significant esophageal varices identified.  There is no axillary or supraclavicular adenopathy identified. Extensive ventral and lateral chest wall collateral vessels are identified. 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  Review of the visualized bony structures is significant for mild thoracic spondylosis no aggressive lytic or sclerotic bone lesions.  CT ABDOMEN FINDINGS  No focal liver abnormality identified. The liver has a mildly nodular contour. No focal liver abnormality noted. Small stones are gallbladder sludge identified within the dependent portion of the gallbladder. No gallbladder wall thickening or pericholecystic fluid. The pancreas is normal. The spleen is normal in appearance.  Normal appearance of both adrenal glands. End stage bilateral cystic kidneys are identified. These are of varying attenuation and complexity. Within the lower pole of the left kidney there is a dominant cyst measuring 4.8 cm, which contains a mural nodule measuring 1 cm, image 50/series 5. The urinary bladder appears collapsed. The prostate gland and seminal vesicles are unremarkable.  Mild calcified atherosclerotic disease involves the  abdominal aorta. There is no aneurysm. No upper abdominal adenopathy identified. There is no pelvic or inguinal adenopathy. Bilateral lower extremity femoral grafts are identified.  No ascites identified. No abnormal fluid collections noted. The stomach appears normal. The small bowel loops have a normal course and caliber without obstruction. Normal appearance of the colon.  Review of the visualized osseous structures shows mild scoliosis and spondylosis. No aggressive lytic or sclerotic bone lesions.  IMPRESSION: CT chest:  1. Moderate bilateral pleural effusions and interstitial edema consistent with CHF. 2. 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 3. No evidence for esophageal varices.  CT abdomen:  1. No acute findings within the abdomen or pelvis. 2. End-stage cystic kidneys. There are numerous bilateral renal cysts of varying complexity. 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 Urologic consultation is recommended. 3. Mild changes of cirrhosis. 4. Extensive atherosclerotic disease.   Electronically Signed   By: Signa Kell M.D.   On: 10/05/2013 14:08   Ct Abdomen W Contrast  10/05/2013  CLINICAL DATA:  Cirrhosis of the liver. Evaluate for esophageal varices.  EXAM: CT CHEST AND ABDOMEN WITH CONTRAST  TECHNIQUE: Multidetector CT imaging of the chest and abdomen was performed following the standard protocol during bolus administration of intravenous contrast.  CONTRAST:  80mL OMNIPAQUE IOHEXOL 300 MG/ML  SOLN  COMPARISON:  None  FINDINGS: CT CHEST FINDINGS  There are moderate bilateral pleural effusions identified. Mild diffuse interstitial edema is identified. Compressive type atelectasis is present in the lower lobes overlying both effusions.  The trachea appears patent. Normal heart size. No pericardial effusion. The stress set calcified atherosclerotic disease affects the thoracic aorta. Right paratracheal lymph node measures 1.2 cm. No  significant esophageal varices identified.  There is no axillary or supraclavicular adenopathy identified. Extensive ventral and lateral chest wall collateral vessels are identified. 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  Review of the visualized bony structures is significant for mild thoracic spondylosis no aggressive lytic or sclerotic bone lesions.  CT ABDOMEN FINDINGS  No focal liver abnormality identified. The liver has a mildly nodular contour. No focal liver abnormality noted. Small stones are gallbladder sludge identified within the dependent portion of the gallbladder. No gallbladder wall thickening or pericholecystic fluid. The pancreas is normal. The spleen is normal in appearance.  Normal appearance of both adrenal glands. End stage bilateral cystic kidneys are identified. These are of varying attenuation and complexity. Within the lower pole of the left kidney there is a dominant cyst measuring 4.8 cm, which contains a mural nodule measuring 1 cm, image 50/series 5. The urinary bladder appears collapsed. The prostate gland and seminal vesicles are unremarkable.  Mild calcified atherosclerotic disease involves the abdominal aorta. There is no aneurysm. No upper abdominal adenopathy identified. There is no pelvic or inguinal adenopathy. Bilateral lower extremity femoral grafts are identified.  No ascites identified. No abnormal fluid collections noted. The stomach appears normal. The small bowel loops have a normal course and caliber without obstruction. Normal appearance of the colon.  Review of the visualized osseous structures shows mild scoliosis and spondylosis. No aggressive lytic or sclerotic bone lesions.  IMPRESSION: CT chest:  1. Moderate bilateral pleural effusions and interstitial edema consistent with CHF. 2. 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 3. No evidence for esophageal varices.  CT  abdomen:  1. No acute findings within the abdomen or pelvis. 2. End-stage cystic kidneys. There are numerous bilateral renal cysts of varying complexity. 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 Urologic consultation is recommended. 3. Mild changes of cirrhosis. 4. Extensive atherosclerotic disease.   Electronically Signed   By: Signa Kell M.D.   On: 10/05/2013 14:08   US Abdomen Complete  09/14/2013   CLINICAL DATA:  Evaluate for cirrhosis. Elevated LFTs. History of end-stage renal disease.  EXAM: ULTRASOUND ABDOMEN COMPLETE  COMPARISON:  Abdominal CT 05/18/2008  FINDINGS: Gallbladder:  Echogenic structures within the gallbladder are consistent with stones. No gallbladder wall thickening. Patient does not have a sonographic Murphy's sign. Gallbladder is mildly distended.  Common bile duct:  Diameter: Measures 0.3 cm.  Liver:  Liver parenchyma is heterogeneous but no specific signs for cirrhosis. Main portal vein is patent.  IVC:  No abnormality visualized.  Pancreas:  Not visualized.  Spleen:  Spleen measures 7.9 cm in length without focal abnormality.  Right Kidney:  Length: 9.1 cm. Right kidney is diffusely heterogeneous and contains multiple cysts. Large calcification in the right kidney upper  pole measures 1.2 cm. Right kidney is echogenic and poorly characterized. Largest right kidney cyst measures up to 2.5 cm.  Left Kidney:  Length: 8.9 cm. Left kidney is very heterogeneous and there are multiple cystic and nodular structures throughout the kidney. Largest cystic structure measures up to 4.4 cm. Left kidney is very echogenic.  Abdominal aorta:  Mid and distal aorta are not visualized.  IMPRESSION: No gross abnormality to the liver. No evidence for biliary dilatation.  Cholelithiasis.  Both kidneys are very difficult to visualize and have cysts. Suspect that many of the cysts are complex and poorly characterized on this examination. Both kidneys are echogenic and consistent with chronic  medical renal disease.  Large calcification in the right kidney upper pole.   Electronically Signed   By: Richarda Overlie M.D.   On: 09/14/2013 16:28   Dg Chest Port 1 View  10/02/2013   CLINICAL DATA:  Hematemesis.  EXAM: PORTABLE CHEST - 1 VIEW  COMPARISON:  Chest radiograph performed 09/14/2013  FINDINGS: There is persistent elevation of the right hemidiaphragm. Patchy left-sided airspace opacity raises concern for pneumonia. No pleural effusion or pneumothorax is seen.  The cardiomediastinal silhouette is normal in size. No acute osseous abnormalities are identified.  IMPRESSION: Patchy left-sided airspace opacity, concerning for pneumonia.   Electronically Signed   By: Roanna Raider M.D.   On: 10/02/2013 04:52   Dg Chest Port 1 View  09/14/2013   CLINICAL DATA:  Altered mental status.  EXAM: PORTABLE CHEST - 1 VIEW  COMPARISON:  08/29/2013  FINDINGS: Heart is normal size. No confluent airspace opacities. Low lung volumes. No effusions or acute bony abnormality.  IMPRESSION: No active disease.   Electronically Signed   By: Charlett Nose M.D.   On: 09/14/2013 10:52   Admission HPI:   62 years old with PMH relevant for mental retardation, mutism, ESRD on HD, multiple episodes of GI bleed, the most recent at the end of November, esophageal varices, CHF, seizures. Presents with coffee ground emesis x 2, melena and hypotension. His lactic acid at admission is 7.6. Fluid resuscitation was started in the ED. Of note, he has history of very complicated IV access. An EJ line was started in the ED. At the time of my examination the patient is awake and alert. His BP is 91/39 which as per his sister at the bedside is not to far from his baseline BP. The sister denies any other symptoms including cough, sputum production, abdominal pain, fever. Recent admission for similar symptoms 11/26-11/29. EGD during that hospitalization was negative for acute bleed or varices. Outpatient hemoglobins have not returned to his  baseline of 10-11.   Hospital Course by problem list:   Presumed Upper GI Bleeding: Presented with 2 episodes of coffee ground emesis, melena and hypotension. With admitting Hemoglobin of 8.7.  Initially started on IV antibiotics-Piperacillin- Tazobactam and Vancomycin as there was an initial concern for sepsis, but this was later discontinued as there was low clinical suspicion for infection and in an effort to preserve pt vascular access as venous access was a challenge. Pt was transferred to the Intensive Care Unit from the ED, and had an Intra arterial line and IJ line placed when his Hemoglobin dropped to 6.8, 10/02/2013, he was subsequently given 2 units of blood between- 12/14 and 12/15.  And started on a Pantoprazole infusion. Post transfusion Hemoglobin is 9.2. Pt was started on phenylephrine for persistent hypotension, but patient is hypotensive at baseline and is on Midodrine-  Home meds. EGD performed by Dr. Hung-10/03/2013, revealed nonbleeding esophageal ulcers without active bleeding. Patient was subsequently transferred to the teaching service, 10/05/2013. Chest and Abdominal CT recommended by GI were done to help explain cause of GI bleed, but both did not yield any explanatory.  Patients Intra-arterial and IJ lines were removed, as there was an increasing risk of infection. Hemoglobin remained stable after transfusion, 10/06/2013, Hgb was 9.4. On the 10/06/2013, Patient started refusing further blood draws/venpunctures, or to take any food or meds orally, despite intervention by family members. On discussion with pts sister about pt, she revealed that this was a typical reaction to being in the hospital for prolonged periods, and once he is discharged home he starts taking all his meds and meals, and also patient takes care of himself and feeds himself at home. Patients meds- Dilantin, Pantoprazole were therefore given when pt went in for dialysis and as pt blood glucose started to drop- , 25mg   of IV dextrose was also given during dialysis session, after consulting with Nephrology. Physical and occupational therapy were consulted, but patient would not co-operate and so no recommendations were made. As patient was clinically stable, patient was discharged home, to follow up with [place] in clinic, and family was counselled that if patient continued to refuse meals and meds to come back to the hospital. Patient was discharged to continue pantoprazole at 40mg  twice daily.  Bilat pleural effusions- Seen on CT. Most Likely due to fluid overload, ESRD on HD. Pt completely asymptomatic- pt was not coughing or SOB, afebrile throughout admisssion, and WBC- 5.8, 10/05/2013 WNL. Follow up if symptomatic as an out-patient.  Seizure disorder: Stable without seizures. Discharged home to continue Dilantin at 200mg  daily.    ESRD on hemodialysis: Being followed by the renal, hemodialysis on [facility].   CODE status Was discussed with the family, as per notes by PCCM on 10/02/2013, and they indicated they wanted pt to be Full code.   Discharge Vitals:   BP 104/59  Pulse 95  Temp(Src) 97.8 F (36.6 C) (Oral)  Resp 21  Ht 5' 4.96" (1.65 m)  Wt 115 lb 15.4 oz (52.6 kg)  BMI 19.32 kg/m2  SpO2 98%  Discharge Labs:  No results found for this or any previous visit (from the past 24 hour(s)).  Signed: Kennis Carina, MD 10/11/2013, 2:47 PM   Time Spent on Discharge: 35 minutes Services Ordered on Discharge: None. Equipment Ordered on Discharge: None.

## 2013-10-17 ENCOUNTER — Other Ambulatory Visit (HOSPITAL_COMMUNITY): Payer: Self-pay | Admitting: Nephrology

## 2013-10-17 ENCOUNTER — Encounter (HOSPITAL_COMMUNITY): Payer: Self-pay

## 2013-10-17 DIAGNOSIS — T82868A Thrombosis of vascular prosthetic devices, implants and grafts, initial encounter: Secondary | ICD-10-CM

## 2013-10-17 DIAGNOSIS — N186 End stage renal disease: Secondary | ICD-10-CM

## 2013-10-18 ENCOUNTER — Encounter (HOSPITAL_COMMUNITY): Payer: Self-pay | Admitting: *Deleted

## 2013-10-18 ENCOUNTER — Other Ambulatory Visit: Payer: Self-pay | Admitting: Radiology

## 2013-10-18 SURGERY — Surgical Case
Anesthesia: *Unknown

## 2013-10-18 NOTE — Progress Notes (Signed)
Patient lives with Carroll Sage, his sister [name] is guardian.  I left a message for Mrs Arvilla Market to bring guardianship paper with her in am.

## 2013-10-19 ENCOUNTER — Ambulatory Visit (HOSPITAL_COMMUNITY)
Admission: RE | Admit: 2013-10-19 | Discharge: 2013-10-19 | Disposition: A | Discharge status: Home / Self Care | Payer: Medicare Other | Source: Ambulatory Visit | Attending: Interventional Radiology | Admitting: Interventional Radiology

## 2013-10-19 ENCOUNTER — Ambulatory Visit (HOSPITAL_COMMUNITY): Payer: Medicare Other | Admitting: Certified Registered"

## 2013-10-19 ENCOUNTER — Ambulatory Visit (HOSPITAL_COMMUNITY)
Admission: RE | Admit: 2013-10-19 | Discharge: 2013-10-19 | Disposition: A | Discharge status: Home / Self Care | Payer: Medicare Other | Source: Ambulatory Visit | Attending: Nephrology | Admitting: Nephrology

## 2013-10-19 ENCOUNTER — Encounter (HOSPITAL_COMMUNITY)
Admission: RE | Disposition: A | Discharge status: Home / Self Care | Payer: Self-pay | Source: Ambulatory Visit | Attending: Interventional Radiology

## 2013-10-19 ENCOUNTER — Encounter (HOSPITAL_COMMUNITY): Payer: Self-pay

## 2013-10-19 ENCOUNTER — Encounter (HOSPITAL_COMMUNITY): Payer: Self-pay | Admitting: Anesthesiology

## 2013-10-19 ENCOUNTER — Encounter (HOSPITAL_COMMUNITY): Payer: Medicare Other | Admitting: Certified Registered"

## 2013-10-19 ENCOUNTER — Encounter (HOSPITAL_COMMUNITY): Payer: Self-pay | Admitting: *Deleted

## 2013-10-19 DIAGNOSIS — I120 Hypertensive chronic kidney disease with stage 5 chronic kidney disease or end stage renal disease: Secondary | ICD-10-CM | POA: Diagnosis not present

## 2013-10-19 DIAGNOSIS — Z992 Dependence on renal dialysis: Secondary | ICD-10-CM | POA: Insufficient documentation

## 2013-10-19 DIAGNOSIS — R4701 Aphasia: Secondary | ICD-10-CM | POA: Insufficient documentation

## 2013-10-19 DIAGNOSIS — T82868A Thrombosis of vascular prosthetic devices, implants and grafts, initial encounter: Secondary | ICD-10-CM

## 2013-10-19 DIAGNOSIS — K219 Gastro-esophageal reflux disease without esophagitis: Secondary | ICD-10-CM | POA: Insufficient documentation

## 2013-10-19 DIAGNOSIS — E785 Hyperlipidemia, unspecified: Secondary | ICD-10-CM | POA: Insufficient documentation

## 2013-10-19 DIAGNOSIS — I509 Heart failure, unspecified: Secondary | ICD-10-CM | POA: Diagnosis not present

## 2013-10-19 DIAGNOSIS — N186 End stage renal disease: Secondary | ICD-10-CM | POA: Diagnosis not present

## 2013-10-19 DIAGNOSIS — T82898A Other specified complication of vascular prosthetic devices, implants and grafts, initial encounter: Secondary | ICD-10-CM | POA: Diagnosis present

## 2013-10-19 DIAGNOSIS — F79 Unspecified intellectual disabilities: Secondary | ICD-10-CM | POA: Insufficient documentation

## 2013-10-19 DIAGNOSIS — Y832 Surgical operation with anastomosis, bypass or graft as the cause of abnormal reaction of the patient, or of later complication, without mention of misadventure at the time of the procedure: Secondary | ICD-10-CM | POA: Diagnosis not present

## 2013-10-19 DIAGNOSIS — R569 Unspecified convulsions: Secondary | ICD-10-CM | POA: Diagnosis not present

## 2013-10-19 HISTORY — PX: RADIOLOGY WITH ANESTHESIA: SHX6223

## 2013-10-19 LAB — POCT I-STAT 4, (NA,K, GLUC, HGB,HCT)
Glucose, Bld: 101 mg/dL — ABNORMAL HIGH (ref 70–99)
HCT: 32 % — ABNORMAL LOW (ref 39.0–52.0)
Hemoglobin: 10.9 g/dL — ABNORMAL LOW (ref 13.0–17.0)
Sodium: 147 mEq/L (ref 137–147)

## 2013-10-19 SURGERY — RADIOLOGY WITH ANESTHESIA
Anesthesia: General | Laterality: Right

## 2013-10-19 MED ORDER — SODIUM CHLORIDE 0.9 % IV SOLN
Freq: Once | INTRAVENOUS | Status: AC
  Administered 2013-10-19: 10:00:00 via INTRAVENOUS

## 2013-10-19 MED ORDER — ARTIFICIAL TEARS OP OINT
TOPICAL_OINTMENT | OPHTHALMIC | Status: DC | PRN
  Administered 2013-10-19: 1 via OPHTHALMIC

## 2013-10-19 MED ORDER — KETAMINE HCL 100 MG/ML IJ SOLN
INTRAMUSCULAR | Status: AC
  Filled 2013-10-19: qty 1

## 2013-10-19 MED ORDER — OXYCODONE HCL 5 MG/5ML PO SOLN: 5.0000 mg | Freq: Once | ORAL | Status: DC | PRN

## 2013-10-19 MED ORDER — OXYCODONE HCL 5 MG PO TABS: 5.0000 mg | ORAL_TABLET | Freq: Once | ORAL | Status: DC | PRN

## 2013-10-19 MED ORDER — MIDAZOLAM HCL 5 MG/5ML IJ SOLN
INTRAMUSCULAR | Status: DC | PRN
  Administered 2013-10-19: 0.5 mg via INTRAVENOUS

## 2013-10-19 MED ORDER — HEPARIN SODIUM (PORCINE) 1000 UNIT/ML IJ SOLN
INTRAMUSCULAR | Status: DC | PRN
  Administered 2013-10-19: 3000 [IU] via INTRAVENOUS

## 2013-10-19 MED ORDER — EPHEDRINE SULFATE 50 MG/ML IJ SOLN
INTRAMUSCULAR | Status: DC | PRN
  Administered 2013-10-19 (×2): 5 mg via INTRAVENOUS
  Administered 2013-10-19 (×3): 10 mg via INTRAVENOUS

## 2013-10-19 MED ORDER — IOHEXOL 300 MG/ML  SOLN
150.0000 mL | Freq: Once | INTRAMUSCULAR | Status: AC | PRN
  Administered 2013-10-19: 1 mL via INTRAVENOUS

## 2013-10-19 MED ORDER — ONDANSETRON HCL 4 MG/2ML IJ SOLN: 4.0000 mg | Freq: Four times a day (QID) | INTRAMUSCULAR | Status: DC | PRN

## 2013-10-19 MED ORDER — ONDANSETRON HCL 4 MG/2ML IJ SOLN
INTRAMUSCULAR | Status: DC | PRN
  Administered 2013-10-19: 4 mg via INTRAVENOUS

## 2013-10-19 MED ORDER — FENTANYL CITRATE 0.05 MG/ML IJ SOLN: 25.0000 ug | INTRAMUSCULAR | Status: DC | PRN

## 2013-10-19 MED ORDER — FENTANYL CITRATE 0.05 MG/ML IJ SOLN
INTRAMUSCULAR | Status: DC | PRN
  Administered 2013-10-19 (×2): 25 ug via INTRAVENOUS

## 2013-10-19 NOTE — Anesthesia Postprocedure Evaluation (Signed)
Anesthesia Post Note  Patient: [name]  Procedure(s) Performed: Procedure(s) (LRB): RADIOLOGY WITH ANESTHESIA (Right)  Anesthesia type: General  Patient location: PACU  Post pain: Pain level controlled and Adequate analgesia  Post assessment: Post-op Vital signs reviewed, Patient's Cardiovascular Status Stable, Respiratory Function Stable, Patent Airway and Pain level controlled  Last Vitals:  Filed Vitals:   10/19/13 1345  BP:   Pulse: 76  Temp: 36.4 C  Resp: 10    Post vital signs: Reviewed and stable  Level of consciousness: awake, alert  and oriented  Complications: No apparent anesthesia complications

## 2013-10-19 NOTE — Preoperative (Signed)
Beta Blockers   Reason not to administer Beta Blockers:Not Applicable 

## 2013-10-19 NOTE — Procedures (Signed)
Interventional Radiology Procedure Note  Procedure: Successful declot of left thigh AVG.  PTA of venous anastomotic stenosis to 7 mm.  Complications: None Recommendations: - DC home after recovered from anesthesia - Return to IR for any signs/sx of recurrent stenosis  Signed,  Sterling Big, MD [facility]

## 2013-10-19 NOTE — Anesthesia Preprocedure Evaluation (Signed)
Anesthesia Evaluation  Patient identified by MRN, date of birth, ID band Patient awake    Reviewed: Allergy & Precautions, H&P , NPO status , Patient's Chart, lab work & pertinent test results  Airway Mallampati: II  Neck ROM: full    Dental   Pulmonary          Cardiovascular hypertension, + Peripheral Vascular Disease and +CHF     Neuro/Psych Seizures -,  H/o mental retardation.   GI/Hepatic PUD, GERD-  ,  Endo/Other    Renal/GU ESRF and DialysisRenal disease     Musculoskeletal   Abdominal   Peds  Hematology   Anesthesia Other Findings   Reproductive/Obstetrics                           Anesthesia Physical Anesthesia Plan  ASA: III  Anesthesia Plan: General   Post-op Pain Management:    Induction: Intravenous  Airway Management Planned: LMA  Additional Equipment:   Intra-op Plan:   Post-operative Plan:   Informed Consent: I have reviewed the patients History and Physical, chart, labs and discussed the procedure including the risks, benefits and alternatives for the proposed anesthesia with the patient or authorized representative who has indicated his/her understanding and acceptance.     Plan Discussed with: CRNA, Anesthesiologist and Surgeon  Anesthesia Plan Comments:         Anesthesia Quick Evaluation

## 2013-10-19 NOTE — H&P (Signed)
Agree with PA note and plan to proceed with LEFT thigh AVG declot.  Signed,  Sterling Big, MD [facility]

## 2013-10-19 NOTE — Transfer of Care (Signed)
Immediate Anesthesia Transfer of Care Note  Patient: [name]  Procedure(s) Performed: Procedure(s): RADIOLOGY WITH ANESTHESIA (Right)  Patient Location: PACU  Anesthesia Type:General  Level of Consciousness: awake and patient cooperative  Airway & Oxygen Therapy: Patient Spontanous Breathing and Patient connected to face mask oxygen  Post-op Assessment: Report given to PACU RN  Post vital signs: Reviewed and stable  Complications: No apparent anesthesia complications

## 2013-10-19 NOTE — H&P (Signed)
[name] is an 62 y.o. male.   Chief Complaint: Left thigh dialysis graft clotted Placed 1 yr ago by vascular surgery Has never has issue with graft before now Last use: 12/27- good flow Clotted since then Scheduled now for thrombolysis and possible angioplasty/stent placement. Possible dialysis catheter placement if needed Pt is mentally retarded. Procedure to be performed with anesthesia  HPI: MR; SVC syndrome; gastric ulcer; CHF; mute; HLD; ESRD; HTN; Sz  Past Medical History  Diagnosis Date  . Heart murmur, systolic 08/10/2009  . Syncope 05/07/2009  . 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 Esophageal varices 11/07/2008  . Gastric ulcer 10/09    antral, with h pylori positive  . Congestive heart failure 03/06/2008  . Cellulitis and abscess of leg, except foot 03/06/2008  . Secondary hyperparathyroidism 02/02/2008  . Mute 02/02/2008  . Hyperlipidemia 02/02/2008  . Anemia 02/02/2008  . ESRD (end stage renal disease)     TTS hemodialysis  . GERD (gastroesophageal reflux disease)   . Hypertension 02/02/2008    in history  . Mental retardation 02/02/2008  . Mute   . Seizures     "non in a while at home". none in past year .    Past Surgical History  Procedure Laterality Date  . Left forearm graft      for HD  . Arteriovenous graft placement  11/22/10    Right thigh AVG  . Thrombectomy and revision of arterioventous (av) goretex  graft    . Thrombectomy and revision of arterioventous (av) goretex  graft  10/22/2012    Procedure: THROMBECTOMY AND REVISION OF ARTERIOVENTOUS (AV) GORETEX  GRAFT;  Surgeon: Larina Earthly, MD;  Location: [facility] OR;  Service: Vascular;  Laterality: Right;  . Thrombectomy w/ embolectomy  11/10/2012    Procedure: THROMBECTOMY ARTERIOVENOUS GORE-TEX GRAFT;  Surgeon: Pryor Ochoa, MD;  Location: [facility] OR;  Service: Vascular;  Laterality: Right;  . Thrombectomy and revision of arterioventous (av) goretex  graft Right 12/08/2012    Procedure:  THROMBECTOMY AND REVISION OF ARTERIOVENTOUS (AV) GORETEX  GRAFT right thigh;  Surgeon: Sherren Kerns, MD;  Location: [facility] OR;  Service: Vascular;  Laterality: Right;  Susie Cassette N/A 12/08/2012    Procedure: VENOGRAM;  Surgeon: Sherren Kerns, MD;  Location: [facility] OR;  Service: Vascular;  Laterality: N/A;  Intraoperative Central venogram  . Thrombectomy w/ embolectomy Right 12/12/2012    Procedure: THROMBECTOMY ARTERIOVENOUS GORE-TEX GRAFT;  Surgeon: Nada Libman, MD;  Location: [facility] OR;  Service: Vascular;  Laterality: Right;  . Insertion of dialysis catheter Left 12/14/2012    Procedure: INSERTION OF DIALYSIS CATHETER;  Surgeon: Nada Libman, MD;  Location: [facility] OR;  Service: Vascular;  Laterality: Left;  . Insertion of dialysis catheter Right 01/13/2013    Procedure: INSERTION OF DIALYSIS CATHETER;  Surgeon: Nada Libman, MD;  Location: [facility] OR;  Service: Vascular;  Laterality: Right;  . Removal of a dialysis catheter Left 01/13/2013    Procedure: REMOVAL OF A DIALYSIS CATHETER;  Surgeon: Nada Libman, MD;  Location: [facility] OR;  Service: Vascular;  Laterality: Left;  . Av fistula placement Left 02/11/2013    Procedure: INSERTION OF ARTERIOVENOUS (AV) GORE-TEX GRAFT THIGH;  Surgeon: Nada Libman, MD;  Location: [facility] OR;  Service: Vascular;  Laterality: Left;  using 6mm x 50cm Gore-Tex Vascular Graft  . Esophagogastroduodenoscopy N/A 02/16/2013    Procedure: ESOPHAGOGASTRODUODENOSCOPY (EGD);  Surgeon: Vertell Novak., MD;  Location: [facility] ENDOSCOPY;  Service: Endoscopy;  Laterality: N/A;  bedside  . Esophagogastroduodenoscopy N/A 09/14/2013    Procedure: ESOPHAGOGASTRODUODENOSCOPY (EGD);  Surgeon: Vertell Novak., MD;  Location: [facility] ENDOSCOPY;  Service: Endoscopy;  Laterality: N/A;  control of bleeding if needed  . Esophagogastroduodenoscopy N/A 10/03/2013    Procedure: ESOPHAGOGASTRODUODENOSCOPY (EGD);  Surgeon: Theda Belfast, MD;  Location: [facility] ENDOSCOPY;  Service: Endoscopy;  Laterality: N/A;   Bedside    History reviewed. No pertinent family history. Social History:  reports that he has never smoked. He has never used smokeless tobacco. He reports that he does not drink alcohol or use illicit drugs.  Allergies: No Known Allergies   (Not in a hospital admission)  No results found for this or any previous visit (from the past 48 hour(s)). No results found.  Review of Systems  Constitutional: Negative for fever.  Respiratory: Negative for shortness of breath.   Gastrointestinal: Negative for nausea, vomiting and abdominal pain.  Neurological: Negative for weakness.    There were no vitals taken for this visit. Physical Exam  Constitutional: He appears well-nourished.  Cardiovascular: Normal rate and regular rhythm.   No murmur heard. Respiratory: Effort normal and breath sounds normal. He has no wheezes.  GI: Soft. Bowel sounds are normal. There is no tenderness.  Musculoskeletal: Normal range of motion.  Rt thigh graft - abandoned Lt thigh graft - no pulse or thrill  Neurological: He is alert.  Skin: Skin is warm and dry.  Psychiatric:  Mentally handicapped/retarded pts sister/guardian consented      Assessment/Plan Clotted L thigh dialysis graft Scheduled for thrombolysis and poss pta/stent. Possible catheter if needed pts sister aware of procedure benefits and risks and agreeable to proceed Consent signed and in chart  [place] Droll A 10/19/2013, 11:08 AM

## 2013-10-24 ENCOUNTER — Encounter (HOSPITAL_COMMUNITY): Payer: Self-pay | Admitting: Interventional Radiology

## 2013-10-25 ENCOUNTER — Encounter: Payer: Self-pay | Admitting: Internal Medicine

## 2013-10-25 ENCOUNTER — Encounter: Payer: Medicare Other | Admitting: Internal Medicine

## 2013-10-26 ENCOUNTER — Other Ambulatory Visit: Payer: Self-pay | Admitting: *Deleted

## 2013-10-26 MED ORDER — CLONAZEPAM 0.5 MG PO TABS: 0.2500 mg | ORAL_TABLET | Freq: Two times a day (BID) | ORAL | Status: DC

## 2013-10-26 NOTE — Telephone Encounter (Signed)
Rx called in to pharmacy. 

## 2013-11-18 ENCOUNTER — Ambulatory Visit: Payer: Medicare Other | Admitting: Nurse Practitioner

## 2013-12-27 ENCOUNTER — Telehealth: Payer: Self-pay | Admitting: *Deleted

## 2013-12-27 ENCOUNTER — Encounter (HOSPITAL_COMMUNITY): Payer: Self-pay | Admitting: *Deleted

## 2013-12-27 ENCOUNTER — Encounter (HOSPITAL_COMMUNITY): Payer: Self-pay | Admitting: Pharmacy Technician

## 2013-12-27 ENCOUNTER — Other Ambulatory Visit (HOSPITAL_COMMUNITY): Payer: Self-pay | Admitting: Nephrology

## 2013-12-27 DIAGNOSIS — N186 End stage renal disease: Secondary | ICD-10-CM

## 2013-12-27 DIAGNOSIS — T82868A Thrombosis of vascular prosthetic devices, implants and grafts, initial encounter: Secondary | ICD-10-CM

## 2013-12-27 NOTE — Telephone Encounter (Signed)
[name] DikeJennifer at [facility] called to arrange a declot of [name]'s Left thigh graft tomorrow, Dr. Hart RochesterLawson reviewed previous studies done in IR and felt like patient could be best served there; possible thrombolysis under anesthesia. [name] DikeJennifer will set this up with interventional radiology.

## 2013-12-27 NOTE — Progress Notes (Signed)
POA is [name], patient's care giver will speak with Ms [name] tonight and have the best number to reach her when patient arrives in am.

## 2013-12-28 ENCOUNTER — Encounter (HOSPITAL_COMMUNITY): Admission: RE | Payer: Self-pay | Source: Ambulatory Visit

## 2013-12-28 ENCOUNTER — Ambulatory Visit (HOSPITAL_COMMUNITY): Admission: RE | Admit: 2013-12-28 | Payer: Medicare Other | Source: Ambulatory Visit | Admitting: Vascular Surgery

## 2013-12-28 ENCOUNTER — Encounter (HOSPITAL_COMMUNITY): Payer: Self-pay

## 2013-12-28 ENCOUNTER — Ambulatory Visit (HOSPITAL_COMMUNITY): Payer: Medicare Other | Admitting: Anesthesiology

## 2013-12-28 ENCOUNTER — Encounter (HOSPITAL_COMMUNITY): Payer: Self-pay | Admitting: Surgery

## 2013-12-28 ENCOUNTER — Ambulatory Visit (HOSPITAL_COMMUNITY)
Admission: RE | Admit: 2013-12-28 | Discharge: 2013-12-28 | Disposition: A | Discharge status: Home / Self Care | Payer: Medicare Other | Source: Ambulatory Visit | Attending: Interventional Radiology | Admitting: Interventional Radiology

## 2013-12-28 ENCOUNTER — Encounter (HOSPITAL_COMMUNITY): Payer: Medicare Other | Admitting: Anesthesiology

## 2013-12-28 ENCOUNTER — Encounter (HOSPITAL_COMMUNITY)
Admission: RE | Disposition: A | Discharge status: Home / Self Care | Payer: Self-pay | Source: Ambulatory Visit | Attending: Interventional Radiology

## 2013-12-28 ENCOUNTER — Ambulatory Visit (HOSPITAL_COMMUNITY)
Admission: RE | Admit: 2013-12-28 | Discharge: 2013-12-28 | Disposition: A | Discharge status: Home / Self Care | Payer: Medicare Other | Source: Ambulatory Visit | Attending: Nephrology | Admitting: Nephrology

## 2013-12-28 DIAGNOSIS — G40909 Epilepsy, unspecified, not intractable, without status epilepticus: Secondary | ICD-10-CM | POA: Insufficient documentation

## 2013-12-28 DIAGNOSIS — I509 Heart failure, unspecified: Secondary | ICD-10-CM | POA: Insufficient documentation

## 2013-12-28 DIAGNOSIS — N2581 Secondary hyperparathyroidism of renal origin: Secondary | ICD-10-CM | POA: Insufficient documentation

## 2013-12-28 DIAGNOSIS — R011 Cardiac murmur, unspecified: Secondary | ICD-10-CM | POA: Insufficient documentation

## 2013-12-28 DIAGNOSIS — T82868A Thrombosis of vascular prosthetic devices, implants and grafts, initial encounter: Secondary | ICD-10-CM

## 2013-12-28 DIAGNOSIS — K219 Gastro-esophageal reflux disease without esophagitis: Secondary | ICD-10-CM | POA: Insufficient documentation

## 2013-12-28 DIAGNOSIS — N186 End stage renal disease: Secondary | ICD-10-CM | POA: Insufficient documentation

## 2013-12-28 DIAGNOSIS — Y849 Medical procedure, unspecified as the cause of abnormal reaction of the patient, or of later complication, without mention of misadventure at the time of the procedure: Secondary | ICD-10-CM | POA: Insufficient documentation

## 2013-12-28 DIAGNOSIS — Z992 Dependence on renal dialysis: Secondary | ICD-10-CM | POA: Insufficient documentation

## 2013-12-28 DIAGNOSIS — F79 Unspecified intellectual disabilities: Secondary | ICD-10-CM | POA: Insufficient documentation

## 2013-12-28 DIAGNOSIS — T82898A Other specified complication of vascular prosthetic devices, implants and grafts, initial encounter: Secondary | ICD-10-CM | POA: Insufficient documentation

## 2013-12-28 DIAGNOSIS — I120 Hypertensive chronic kidney disease with stage 5 chronic kidney disease or end stage renal disease: Secondary | ICD-10-CM | POA: Insufficient documentation

## 2013-12-28 DIAGNOSIS — E785 Hyperlipidemia, unspecified: Secondary | ICD-10-CM | POA: Insufficient documentation

## 2013-12-28 DIAGNOSIS — R4701 Aphasia: Secondary | ICD-10-CM | POA: Insufficient documentation

## 2013-12-28 HISTORY — PX: RADIOLOGY WITH ANESTHESIA: SHX6223

## 2013-12-28 LAB — POCT I-STAT 4, (NA,K, GLUC, HGB,HCT)
Glucose, Bld: 79 mg/dL (ref 70–99)
HCT: 40 % (ref 39.0–52.0)
Hemoglobin: 13.6 g/dL (ref 13.0–17.0)
Potassium: 5.3 mEq/L (ref 3.7–5.3)
Sodium: 150 mEq/L — ABNORMAL HIGH (ref 137–147)

## 2013-12-28 SURGERY — THROMBECTOMY AND REVISION OF ARTERIOVENTOUS (AV) GORETEX  GRAFT
Anesthesia: General | Laterality: Left

## 2013-12-28 SURGERY — RADIOLOGY WITH ANESTHESIA
Anesthesia: General | Laterality: Left

## 2013-12-28 MED ORDER — HEPARIN SODIUM (PORCINE) 1000 UNIT/ML IJ SOLN
INTRAMUSCULAR | Status: AC
  Administered 2013-12-28: 3000 [IU] via INTRAVENOUS
  Filled 2013-12-28: qty 1

## 2013-12-28 MED ORDER — FENTANYL CITRATE 0.05 MG/ML IJ SOLN: 25.0000 ug | INTRAMUSCULAR | Status: DC | PRN

## 2013-12-28 MED ORDER — FENTANYL CITRATE 0.05 MG/ML IJ SOLN
INTRAMUSCULAR | Status: DC | PRN
  Administered 2013-12-28: 50 ug via INTRAVENOUS

## 2013-12-28 MED ORDER — ALTEPLASE 100 MG IV SOLR
2.0000 mg | Freq: Once | INTRAVENOUS | Status: DC
  Filled 2013-12-28: qty 2

## 2013-12-28 MED ORDER — SODIUM CHLORIDE 0.9 % IV SOLN
INTRAVENOUS | Status: DC
  Administered 2013-12-28 (×3): via INTRAVENOUS

## 2013-12-28 MED ORDER — PROPOFOL 10 MG/ML IV BOLUS
INTRAVENOUS | Status: DC | PRN
  Administered 2013-12-28: 180 mg via INTRAVENOUS

## 2013-12-28 MED ORDER — LIDOCAINE HCL (CARDIAC) 20 MG/ML IV SOLN
INTRAVENOUS | Status: DC | PRN
  Administered 2013-12-28: 60 mg via INTRAVENOUS

## 2013-12-28 MED ORDER — MIDAZOLAM HCL 5 MG/5ML IJ SOLN
INTRAMUSCULAR | Status: DC | PRN
  Administered 2013-12-28: 1 mg via INTRAVENOUS

## 2013-12-28 MED ORDER — IOHEXOL 300 MG/ML  SOLN
100.0000 mL | Freq: Once | INTRAMUSCULAR | Status: AC | PRN
  Administered 2013-12-28: 60 mL via INTRAVENOUS

## 2013-12-28 NOTE — H&P (Signed)
[name] is an 63 y.o. male.   Chief Complaint: ESRD Last use of Lt thigh dialysis graft successfully was 3/7--without issue per caretaker Yesterday dialysis was unsuccessful secondary to clot Scheduled now for Lt thigh dialysis graft thrombolysis and possible angioplasty/stent placement. Possible dialysis catheter placement if needed. To be performed with anesthesia--pt is mentally retarded Has had this same procedure 10/19/13--successful  HPI: ESRD; SVC syndrome; esoph varices; CHF; Mute; HLD; HTN; Sz disorder  Past Medical History  Diagnosis Date  . Heart murmur, systolic 08/10/2009  . Syncope 05/07/2009  . 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 Esophageal varices 11/07/2008  . Gastric ulcer 10/09    antral, with h pylori positive  . Congestive heart failure 03/06/2008  . Cellulitis and abscess of leg, except foot 03/06/2008  . Secondary hyperparathyroidism 02/02/2008  . Mute 02/02/2008  . Hyperlipidemia 02/02/2008  . Anemia 02/02/2008  . ESRD (end stage renal disease)     TTS hemodialysis  . GERD (gastroesophageal reflux disease)   . Hypertension 02/02/2008    in history  . Mental retardation 02/02/2008  . Mute   . Seizures     "non in a while at home". none in past year .    Past Surgical History  Procedure Laterality Date  . Left forearm graft      for HD  . Arteriovenous graft placement  11/22/10    Right thigh AVG  . Thrombectomy and revision of arterioventous (av) goretex  graft    . Thrombectomy and revision of arterioventous (av) goretex  graft  10/22/2012    Procedure: THROMBECTOMY AND REVISION OF ARTERIOVENTOUS (AV) GORETEX  GRAFT;  Surgeon: Larina Earthlyodd F Early, MD;  [facility];  Service: Vascular;  Laterality: Right;  . Thrombectomy w/ embolectomy  11/10/2012    Procedure: THROMBECTOMY ARTERIOVENOUS GORE-TEX GRAFT;  Surgeon: Pryor OchoaJames D Lawson, MD;  [facility];  Service: Vascular;  Laterality: Right;  . Thrombectomy and revision of arterioventous (av)  goretex  graft Right 12/08/2012    Procedure: THROMBECTOMY AND REVISION OF ARTERIOVENTOUS (AV) GORETEX  GRAFT right thigh;  Surgeon: Sherren Kernsharles E Fields, MD;  [facility];  Service: Vascular;  Laterality: Right;  Susie Cassette. Venogram N/A 12/08/2012    Procedure: VENOGRAM;  Surgeon: Sherren Kernsharles E Fields, MD;  [facility];  Service: Vascular;  Laterality: N/A;  Intraoperative Central venogram  . Thrombectomy w/ embolectomy Right 12/12/2012    Procedure: THROMBECTOMY ARTERIOVENOUS GORE-TEX GRAFT;  Surgeon: Nada LibmanVance W Brabham, MD;  [facility];  Service: Vascular;  Laterality: Right;  . Insertion of dialysis catheter Left 12/14/2012    Procedure: INSERTION OF DIALYSIS CATHETER;  Surgeon: Nada LibmanVance W Brabham, MD;  [facility];  Service: Vascular;  Laterality: Left;  . Insertion of dialysis catheter Right 01/13/2013    Procedure: INSERTION OF DIALYSIS CATHETER;  Surgeon: Nada LibmanVance W Brabham, MD;  [facility];  Service: Vascular;  Laterality: Right;  . Removal of a dialysis catheter Left 01/13/2013    Procedure: REMOVAL OF A DIALYSIS CATHETER;  Surgeon: Nada LibmanVance W Brabham, MD;  Location: [facility] OR;  Service: Vascular;  Laterality: Left;  . Av fistula placement Left 02/11/2013    Procedure: INSERTION OF ARTERIOVENOUS (AV) GORE-TEX GRAFT THIGH;  Surgeon: Nada LibmanVance W Brabham, MD;  Location: [facility] OR;  Service: Vascular;  Laterality: Left;  using 6mm x 50cm Gore-Tex Vascular Graft  . Esophagogastroduodenoscopy N/A 02/16/2013    Procedure: ESOPHAGOGASTRODUODENOSCOPY (EGD);  Surgeon: Vertell NovakJames L Edwards Jr., MD;  [facility];  Service:  Endoscopy;  Laterality: N/A;  bedside  . Esophagogastroduodenoscopy N/A 09/14/2013    Procedure: ESOPHAGOGASTRODUODENOSCOPY (EGD);  Surgeon: Vertell Novak., MD;  Location: [facility] ENDOSCOPY;  Service: Endoscopy;  Laterality: N/A;  control of bleeding if needed  . Esophagogastroduodenoscopy N/A 10/03/2013    Procedure: ESOPHAGOGASTRODUODENOSCOPY (EGD);  Surgeon: Theda Belfast, MD;  Location: [facility]  ENDOSCOPY;  Service: Endoscopy;  Laterality: N/A;  Bedside  . Radiology with anesthesia Right 10/19/2013    Procedure: RADIOLOGY WITH ANESTHESIA;  Surgeon: Malachy Moan, MD;  Location: [facility] OR;  Service: Radiology;  Laterality: Right;    History reviewed. No pertinent family history. Social History:  reports that he has never smoked. He has never used smokeless tobacco. He reports that he does not drink alcohol or use illicit drugs.  Allergies: No Known Allergies   (Not in a hospital admission)  No results found for this or any previous visit (from the past 48 hour(s)). No results found.  Review of Systems  Constitutional: Negative for fever and weight loss.  Respiratory: Negative for cough.   Cardiovascular: Negative for chest pain.  Gastrointestinal: Negative for nausea, vomiting and abdominal pain.  Neurological: Negative for dizziness and weakness.    There were no vitals taken for this visit. Physical Exam  Constitutional: He appears well-developed and well-nourished.  mentally retarded  Cardiovascular: Normal rate and regular rhythm.   Murmur heard. Respiratory: Effort normal and breath sounds normal. He has no wheezes.  GI: Soft. Bowel sounds are normal. There is no tenderness.  Musculoskeletal: Normal range of motion.  Neurological:  Non verbal; grunts-- but follows most commands  Skin: Skin is warm and dry.  Psychiatric:  consented sister- POA over phone     Assessment/Plan ESRD L thigh dialysis graft clotted Scheduled now for thrombolysis and poss pta/stent placement; poss dialysis catheter if needed Must use anesthesia --pt mentally retarded pts sister and caretaker both aware of procedure benefits and risks and agreeable to proceed Consent signed and in chart---consented sister (POA) over phone  Lyah Millirons A 12/28/2013, 1:02 PM

## 2013-12-28 NOTE — Transfer of Care (Signed)
Immediate Anesthesia Transfer of Care Note  Patient: [name]  Procedure(s) Performed: Procedure(s): RADIOLOGY WITH ANESTHESIA (Left)  Patient Location: PACU  Anesthesia Type:General  Level of Consciousness: awake and patient cooperative  Airway & Oxygen Therapy: Patient Spontanous Breathing and Patient connected to nasal cannula oxygen  Post-op Assessment: Report given to PACU RN, Post -op Vital signs reviewed and stable, Patient moving all extremities and Patient moving all extremities X 4  Post vital signs: Reviewed and stable  Complications: No apparent anesthesia complications

## 2013-12-28 NOTE — H&P (Signed)
Agree.  Patient seen and examined.  For declot of left thigh AVGG today under general anesthesia.

## 2013-12-28 NOTE — Anesthesia Preprocedure Evaluation (Signed)
Anesthesia Evaluation  Patient identified by MRN, date of birth, ID band Patient awake    Reviewed: Allergy & Precautions, H&P , NPO status , Patient's Chart, lab work & pertinent test results  Airway Mallampati: II      Dental   Pulmonary          Cardiovascular hypertension, + Peripheral Vascular Disease and +CHF     Neuro/Psych Seizures -,     GI/Hepatic PUD, GERD-  ,  Endo/Other    Renal/GU Renal disease     Musculoskeletal   Abdominal   Peds  Hematology  (+) anemia ,   Anesthesia Other Findings   Reproductive/Obstetrics                           Anesthesia Physical Anesthesia Plan  ASA: IV  Anesthesia Plan: General   Post-op Pain Management:    Induction: Intravenous  Airway Management Planned: LMA  Additional Equipment:   Intra-op Plan:   Post-operative Plan: Extubation in OR  Informed Consent: I have reviewed the patients History and Physical, chart, labs and discussed the procedure including the risks, benefits and alternatives for the proposed anesthesia with the patient or authorized representative who has indicated his/her understanding and acceptance.   Dental advisory given  Plan Discussed with: CRNA and Anesthesiologist  Anesthesia Plan Comments:         Anesthesia Quick Evaluation

## 2013-12-28 NOTE — Anesthesia Procedure Notes (Signed)
Procedure Name: LMA Insertion Date/Time: 12/28/2013 3:06 PM Performed by: Coralee RudFLORES, Zamya Culhane Pre-anesthesia Checklist: Patient identified, Emergency Drugs available, Suction available and Patient being monitored Patient Re-evaluated:Patient Re-evaluated prior to inductionOxygen Delivery Method: Circle system utilized Preoxygenation: Pre-oxygenation with 100% oxygen Intubation Type: IV induction LMA: LMA with gastric port inserted LMA Size: 4.0 Number of attempts: 1 Placement Confirmation: positive ETCO2 Tube secured with: Tape Dental Injury: Teeth and Oropharynx as per pre-operative assessment

## 2013-12-29 ENCOUNTER — Encounter (HOSPITAL_COMMUNITY): Payer: Self-pay | Admitting: Interventional Radiology

## 2013-12-29 ENCOUNTER — Telehealth (HOSPITAL_COMMUNITY): Payer: Self-pay | Admitting: *Deleted

## 2014-01-04 NOTE — Anesthesia Postprocedure Evaluation (Signed)
  Anesthesia Post-op Note  Patient: [name]  Procedure(s) Performed: Procedure(s): RADIOLOGY WITH ANESTHESIA (Left)  Patient Location: PACU  Anesthesia Type:General  Level of Consciousness: awake  Airway and Oxygen Therapy: Patient Spontanous Breathing  Post-op Pain: mild  Post-op Assessment: Post-op Vital signs reviewed  Post-op Vital Signs: Reviewed  Complications: No apparent anesthesia complications

## 2014-01-24 ENCOUNTER — Encounter (HOSPITAL_COMMUNITY): Payer: Self-pay | Admitting: *Deleted

## 2014-01-24 ENCOUNTER — Other Ambulatory Visit: Payer: Self-pay | Admitting: *Deleted

## 2014-01-24 MED ORDER — DEXTROSE 5 % IV SOLN
1.5000 g | INTRAVENOUS | Status: AC
  Administered 2014-01-25: 1.5 g via INTRAVENOUS
  Filled 2014-01-24: qty 1.5

## 2014-01-24 NOTE — Progress Notes (Signed)
Pt's usual caregiver, Carroll SageHazel Foster has been ill for past month. Her son, Dolly RiasDecatur [name] is now taking care of [name]. He verified health hx, allergies and meds. He was given pre-op instructions. [name] is the POA and Mr. Toni ArthursFuller is trying to get in contact with her this evening. I asked him to get a phone # that she will be available for us to call her in the am after 8:30 AM.  Mr. Toni ArthursFuller states that he will bring pt to hospital in AM, but William Daltonenee Chatney who is his one-on-one caregiver during the day will be here with pt until Mr. Toni ArthursFuller can return around 12 noon.

## 2014-01-25 ENCOUNTER — Encounter (HOSPITAL_COMMUNITY)
Admission: RE | Disposition: A | Discharge status: Home / Self Care | Payer: Self-pay | Source: Ambulatory Visit | Attending: Vascular Surgery

## 2014-01-25 ENCOUNTER — Encounter (HOSPITAL_COMMUNITY): Payer: Medicare Other | Admitting: Anesthesiology

## 2014-01-25 ENCOUNTER — Encounter (HOSPITAL_COMMUNITY): Payer: Self-pay | Admitting: Anesthesiology

## 2014-01-25 ENCOUNTER — Ambulatory Visit (HOSPITAL_COMMUNITY): Payer: Medicare Other | Admitting: Anesthesiology

## 2014-01-25 ENCOUNTER — Ambulatory Visit (HOSPITAL_COMMUNITY)
Admission: RE | Admit: 2014-01-25 | Discharge: 2014-01-25 | Disposition: A | Discharge status: Home / Self Care | Payer: Medicare Other | Source: Ambulatory Visit | Attending: Vascular Surgery | Admitting: Vascular Surgery

## 2014-01-25 DIAGNOSIS — D649 Anemia, unspecified: Secondary | ICD-10-CM | POA: Insufficient documentation

## 2014-01-25 DIAGNOSIS — I120 Hypertensive chronic kidney disease with stage 5 chronic kidney disease or end stage renal disease: Secondary | ICD-10-CM | POA: Insufficient documentation

## 2014-01-25 DIAGNOSIS — R4701 Aphasia: Secondary | ICD-10-CM | POA: Insufficient documentation

## 2014-01-25 DIAGNOSIS — I749 Embolism and thrombosis of unspecified artery: Secondary | ICD-10-CM | POA: Insufficient documentation

## 2014-01-25 DIAGNOSIS — T82898A Other specified complication of vascular prosthetic devices, implants and grafts, initial encounter: Secondary | ICD-10-CM | POA: Insufficient documentation

## 2014-01-25 DIAGNOSIS — I871 Compression of vein: Secondary | ICD-10-CM | POA: Insufficient documentation

## 2014-01-25 DIAGNOSIS — Y832 Surgical operation with anastomosis, bypass or graft as the cause of abnormal reaction of the patient, or of later complication, without mention of misadventure at the time of the procedure: Secondary | ICD-10-CM | POA: Insufficient documentation

## 2014-01-25 DIAGNOSIS — N186 End stage renal disease: Secondary | ICD-10-CM

## 2014-01-25 DIAGNOSIS — I739 Peripheral vascular disease, unspecified: Secondary | ICD-10-CM | POA: Insufficient documentation

## 2014-01-25 DIAGNOSIS — R569 Unspecified convulsions: Secondary | ICD-10-CM | POA: Insufficient documentation

## 2014-01-25 DIAGNOSIS — E785 Hyperlipidemia, unspecified: Secondary | ICD-10-CM | POA: Insufficient documentation

## 2014-01-25 DIAGNOSIS — I509 Heart failure, unspecified: Secondary | ICD-10-CM | POA: Insufficient documentation

## 2014-01-25 DIAGNOSIS — Z8711 Personal history of peptic ulcer disease: Secondary | ICD-10-CM | POA: Insufficient documentation

## 2014-01-25 DIAGNOSIS — R011 Cardiac murmur, unspecified: Secondary | ICD-10-CM | POA: Insufficient documentation

## 2014-01-25 DIAGNOSIS — K219 Gastro-esophageal reflux disease without esophagitis: Secondary | ICD-10-CM | POA: Insufficient documentation

## 2014-01-25 DIAGNOSIS — F79 Unspecified intellectual disabilities: Secondary | ICD-10-CM | POA: Insufficient documentation

## 2014-01-25 DIAGNOSIS — Z992 Dependence on renal dialysis: Secondary | ICD-10-CM | POA: Insufficient documentation

## 2014-01-25 DIAGNOSIS — N2581 Secondary hyperparathyroidism of renal origin: Secondary | ICD-10-CM | POA: Insufficient documentation

## 2014-01-25 HISTORY — PX: THROMBECTOMY AND REVISION OF ARTERIOVENTOUS (AV) GORETEX  GRAFT: SHX6120

## 2014-01-25 SURGERY — THROMBECTOMY AND REVISION OF ARTERIOVENTOUS (AV) GORETEX  GRAFT
Anesthesia: General | Site: Thigh | Laterality: Left

## 2014-01-25 MED ORDER — KETAMINE HCL 100 MG/ML IJ SOLN
INTRAMUSCULAR | Status: AC
  Filled 2014-01-25: qty 1

## 2014-01-25 MED ORDER — CHLORHEXIDINE GLUCONATE CLOTH 2 % EX PADS: 6.0000 | MEDICATED_PAD | Freq: Once | CUTANEOUS | Status: DC

## 2014-01-25 MED ORDER — IBUPROFEN 400 MG PO TABS
400.0000 mg | ORAL_TABLET | Freq: Once | ORAL | Status: AC
  Administered 2014-01-25: 400 mg via ORAL
  Filled 2014-01-25: qty 1

## 2014-01-25 MED ORDER — ONDANSETRON HCL 4 MG/2ML IJ SOLN
INTRAMUSCULAR | Status: AC
  Filled 2014-01-25: qty 2

## 2014-01-25 MED ORDER — HEPARIN SODIUM (PORCINE) 5000 UNIT/ML IJ SOLN
INTRAMUSCULAR | Status: DC | PRN
  Administered 2014-01-25: 13:00:00

## 2014-01-25 MED ORDER — EPHEDRINE SULFATE 50 MG/ML IJ SOLN
INTRAMUSCULAR | Status: DC | PRN
  Administered 2014-01-25: 10 mg via INTRAVENOUS
  Administered 2014-01-25: 5 mg via INTRAVENOUS

## 2014-01-25 MED ORDER — PROPOFOL 10 MG/ML IV BOLUS
INTRAVENOUS | Status: DC | PRN
  Administered 2014-01-25: 50 mg via INTRAVENOUS

## 2014-01-25 MED ORDER — FENTANYL CITRATE 0.05 MG/ML IJ SOLN
INTRAMUSCULAR | Status: AC
  Filled 2014-01-25: qty 5

## 2014-01-25 MED ORDER — ARTIFICIAL TEARS OP OINT
TOPICAL_OINTMENT | OPHTHALMIC | Status: AC
  Filled 2014-01-25: qty 3.5

## 2014-01-25 MED ORDER — LIDOCAINE HCL (CARDIAC) 20 MG/ML IV SOLN
INTRAVENOUS | Status: DC | PRN
  Administered 2014-01-25: 40 mg via INTRAVENOUS

## 2014-01-25 MED ORDER — PROPOFOL 10 MG/ML IV BOLUS
INTRAVENOUS | Status: AC
  Filled 2014-01-25: qty 20

## 2014-01-25 MED ORDER — SODIUM CHLORIDE 0.9 % IV SOLN
INTRAVENOUS | Status: DC
  Administered 2014-01-25: 13:00:00 via INTRAVENOUS

## 2014-01-25 MED ORDER — 0.9 % SODIUM CHLORIDE (POUR BTL) OPTIME
TOPICAL | Status: DC | PRN
  Administered 2014-01-25: 1000 mL

## 2014-01-25 MED ORDER — LIDOCAINE HCL (CARDIAC) 20 MG/ML IV SOLN
INTRAVENOUS | Status: AC
  Filled 2014-01-25: qty 5

## 2014-01-25 MED ORDER — FENTANYL CITRATE 0.05 MG/ML IJ SOLN
INTRAMUSCULAR | Status: DC | PRN
  Administered 2014-01-25: 25 ug via INTRAVENOUS
  Administered 2014-01-25: 50 ug via INTRAVENOUS

## 2014-01-25 MED ORDER — ONDANSETRON HCL 4 MG/2ML IJ SOLN
INTRAMUSCULAR | Status: DC | PRN
  Administered 2014-01-25: 4 mg via INTRAVENOUS

## 2014-01-25 MED ORDER — OXYCODONE HCL 5 MG PO TABS: 5.0000 mg | ORAL_TABLET | Freq: Four times a day (QID) | ORAL | Status: DC | PRN

## 2014-01-25 MED ORDER — PHENYLEPHRINE HCL 10 MG/ML IJ SOLN
10.0000 mg | INTRAMUSCULAR | Status: DC | PRN
  Administered 2014-01-25: 25 ug/min via INTRAVENOUS

## 2014-01-25 MED ORDER — PHENYLEPHRINE HCL 10 MG/ML IJ SOLN
INTRAMUSCULAR | Status: DC | PRN
  Administered 2014-01-25: 160 ug via INTRAVENOUS

## 2014-01-25 SURGICAL SUPPLY — 40 items
ADH SKN CLS APL DERMABOND .7 (GAUZE/BANDAGES/DRESSINGS) ×1
ARMBAND PINK RESTRICT EXTREMIT (MISCELLANEOUS) ×1 IMPLANT
BLADE SURG ROTATE 9660 (MISCELLANEOUS) ×2 IMPLANT
CANISTER SUCTION 2500CC (MISCELLANEOUS) ×3 IMPLANT
CATH EMB 5FR 80CM (CATHETERS) ×3 IMPLANT
CLIP TI MEDIUM 6 (CLIP) ×3 IMPLANT
CLIP TI WIDE RED SMALL 6 (CLIP) ×3 IMPLANT
COVER SURGICAL LIGHT HANDLE (MISCELLANEOUS) ×3 IMPLANT
DERMABOND ADVANCED (GAUZE/BANDAGES/DRESSINGS) ×2
DERMABOND ADVANCED .7 DNX12 (GAUZE/BANDAGES/DRESSINGS) ×1 IMPLANT
ELECT REM PT RETURN 9FT ADLT (ELECTROSURGICAL) ×3
ELECTRODE REM PT RTRN 9FT ADLT (ELECTROSURGICAL) ×1 IMPLANT
GEL ULTRASOUND 20GR AQUASONIC (MISCELLANEOUS) IMPLANT
GLOVE BIO SURGEON STRL SZ 6.5 (GLOVE) ×2 IMPLANT
GLOVE BIO SURGEONS STRL SZ 6.5 (GLOVE) ×2
GLOVE BIOGEL PI IND STRL 7.0 (GLOVE) IMPLANT
GLOVE BIOGEL PI INDICATOR 7.0 (GLOVE) ×4
GLOVE SS BIOGEL STRL SZ 7 (GLOVE) ×1 IMPLANT
GLOVE SUPERSENSE BIOGEL SZ 7 (GLOVE) ×2
GLOVE SURG SS PI 7.0 STRL IVOR (GLOVE) ×4 IMPLANT
GOWN STRL REUS W/ TWL LRG LVL3 (GOWN DISPOSABLE) ×3 IMPLANT
GOWN STRL REUS W/ TWL XL LVL3 (GOWN DISPOSABLE) IMPLANT
GOWN STRL REUS W/TWL LRG LVL3 (GOWN DISPOSABLE) ×6
GOWN STRL REUS W/TWL XL LVL3 (GOWN DISPOSABLE) ×9
HEMASHIELD FINESSE CARDIO (Vascular Products) ×3 IMPLANT
KIT BASIN OR (CUSTOM PROCEDURE TRAY) ×3 IMPLANT
KIT ROOM TURNOVER OR (KITS) ×3 IMPLANT
NS IRRIG 1000ML POUR BTL (IV SOLUTION) ×3 IMPLANT
PACK CV ACCESS (CUSTOM PROCEDURE TRAY) ×3 IMPLANT
PAD ARMBOARD 7.5X6 YLW CONV (MISCELLANEOUS) ×6 IMPLANT
PATCH HEMASHIELD FINESS CARDIO (Vascular Products) IMPLANT
SUT PROLENE 6 0 BV (SUTURE) ×3 IMPLANT
SUT PROLENE 6 0 C 1 24 (SUTURE) ×2 IMPLANT
SUT VIC AB 2-0 CTX 36 (SUTURE) ×2 IMPLANT
SUT VIC AB 3-0 SH 27 (SUTURE) ×3
SUT VIC AB 3-0 SH 27X BRD (SUTURE) ×1 IMPLANT
TOWEL OR 17X24 6PK STRL BLUE (TOWEL DISPOSABLE) ×3 IMPLANT
TOWEL OR 17X26 10 PK STRL BLUE (TOWEL DISPOSABLE) ×3 IMPLANT
UNDERPAD 30X30 INCONTINENT (UNDERPADS AND DIAPERS) ×3 IMPLANT
WATER STERILE IRR 1000ML POUR (IV SOLUTION) ×3 IMPLANT

## 2014-01-25 NOTE — Progress Notes (Signed)
Pt uncooperative with staff .Cregiver  able to dress pt and place in wheelchair. Home instructions to caregiver. Hr 110-118, mildly agitated.more cooperative with caregiver Dr. Krista BlueSinger updated and caregiver to take home. Dialysis in am.

## 2014-01-25 NOTE — Anesthesia Procedure Notes (Signed)
Procedure Name: LMA Insertion Date/Time: 01/25/2014 1:32 PM Performed by: Sharlene DoryWALKER, Lian Pounds E Pre-anesthesia Checklist: Patient identified, Emergency Drugs available, Suction available, Patient being monitored and Timeout performed Patient Re-evaluated:Patient Re-evaluated prior to inductionOxygen Delivery Method: Circle system utilized Preoxygenation: Pre-oxygenation with 100% oxygen Intubation Type: IV induction and Inhalational induction Ventilation: Mask ventilation without difficulty LMA: LMA inserted LMA Size: 4.0 Number of attempts: 1 Placement Confirmation: positive ETCO2 and breath sounds checked- equal and bilateral Tube secured with: Tape Dental Injury: Teeth and Oropharynx as per pre-operative assessment

## 2014-01-25 NOTE — H&P (Signed)
Vascular Surgery H&P  Chief Complaint: Patient with end-stage renal disease with recurrent thrombosis left thigh AV graft IR  HPI: [name] is a 63 y.o. male who presents for evaluation of clotted left AV graft. Patient has had multiple procedures in IR for clotted AV graft left thigh. Today he is scheduled for attempt at surgical revision with thrombectomy. Apparently he had severe stenosis at venous anastomosis that last procedure.   Past Medical History  Diagnosis Date  . Heart murmur, systolic 08/10/2009  . Syncope 05/07/2009  . 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 Esophageal varices 11/07/2008  . Gastric ulcer 10/09    antral, with h pylori positive  . Congestive heart failure 03/06/2008  . Cellulitis and abscess of leg, except foot 03/06/2008  . Secondary hyperparathyroidism 02/02/2008  . Mute 02/02/2008  . Hyperlipidemia 02/02/2008  . Anemia 02/02/2008  . ESRD (end stage renal disease)     TTS hemodialysis  . GERD (gastroesophageal reflux disease)   . Hypertension 02/02/2008    in history  . Mental retardation 02/02/2008  . Mute   . Seizures     "non in a while at home". none in past year .   Past Surgical History  Procedure Laterality Date  . Left forearm graft      for HD  . Arteriovenous graft placement  11/22/10    Right thigh AVG  . Thrombectomy and revision of arterioventous (av) goretex  graft    . Thrombectomy and revision of arterioventous (av) goretex  graft  10/22/2012    Procedure: THROMBECTOMY AND REVISION OF ARTERIOVENTOUS (AV) GORETEX  GRAFT;  Surgeon: Larina Earthlyodd F Early, MD;  [facility];  Service: Vascular;  Laterality: Right;  . Thrombectomy w/ embolectomy  11/10/2012    Procedure: THROMBECTOMY ARTERIOVENOUS GORE-TEX GRAFT;  Surgeon: Pryor OchoaJames D Rosbel Buckner, MD;  [facility];  Service: Vascular;  Laterality: Right;  . Thrombectomy and revision of arterioventous (av) goretex  graft Right 12/08/2012    Procedure: THROMBECTOMY AND REVISION OF  ARTERIOVENTOUS (AV) GORETEX  GRAFT right thigh;  Surgeon: Sherren Kernsharles E Fields, MD;  [facility];  Service: Vascular;  Laterality: Right;  Susie Cassette. Venogram N/A 12/08/2012    Procedure: VENOGRAM;  Surgeon: Sherren Kernsharles E Fields, MD;  [facility];  Service: Vascular;  Laterality: N/A;  Intraoperative Central venogram  . Thrombectomy w/ embolectomy Right 12/12/2012    Procedure: THROMBECTOMY ARTERIOVENOUS GORE-TEX GRAFT;  Surgeon: Nada LibmanVance W Brabham, MD;  [facility];  Service: Vascular;  Laterality: Right;  . Insertion of dialysis catheter Left 12/14/2012    Procedure: INSERTION OF DIALYSIS CATHETER;  Surgeon: Nada LibmanVance W Brabham, MD;  [facility];  Service: Vascular;  Laterality: Left;  . Insertion of dialysis catheter Right 01/13/2013    Procedure: INSERTION OF DIALYSIS CATHETER;  Surgeon: Nada LibmanVance W Brabham, MD;  [facility];  Service: Vascular;  Laterality: Right;  . Removal of a dialysis catheter Left 01/13/2013    Procedure: REMOVAL OF A DIALYSIS CATHETER;  Surgeon: Nada LibmanVance W Brabham, MD;  Location: [facility] OR;  Service: Vascular;  Laterality: Left;  . Av fistula placement Left 02/11/2013    Procedure: INSERTION OF ARTERIOVENOUS (AV) GORE-TEX GRAFT THIGH;  Surgeon: Nada LibmanVance W Brabham, MD;  Location: [facility] OR;  Service: Vascular;  Laterality: Left;  using 6mm x 50cm Gore-Tex Vascular Graft  . Esophagogastroduodenoscopy N/A 02/16/2013    Procedure: ESOPHAGOGASTRODUODENOSCOPY (EGD);  Surgeon: Vertell NovakJames L Edwards Jr., MD;  [facility];  Service: Endoscopy;  Laterality: N/A;  bedside  . Esophagogastroduodenoscopy N/A 09/14/2013    Procedure: ESOPHAGOGASTRODUODENOSCOPY (EGD);  Surgeon: Vertell Novak., MD;  Location[facility]OSCOPY;  Service: Endoscopy;  Laterality: N/A;  control of bleeding if needed  . Esophagogastroduodenoscopy N/A 10/03/2013    Procedure: ESOPHAGOGASTRODUODENOSCOPY (EGD);  Surgeon: Theda Belfast, MD;  Location: [facility] ENDOSCOPY;  Service: Endoscopy;  Laterality: N/A;  Bedside  . Radiology with  anesthesia Right 10/19/2013    Procedure: RADIOLOGY WITH ANESTHESIA;  Surgeon: Malachy Moan, MD;  Location: [facility] OR;  Service: Radiology;  Laterality: Right;  . Radiology with anesthesia Left 12/28/2013    Procedure: RADIOLOGY WITH ANESTHESIA;  Surgeon: Reola Calkins, MD;  Location: [facility] OR;  Service: Radiology;  Laterality: Left;   History   Social History  . Marital Status: Single    Spouse Name: N/A    Number of Children: N/A  . Years of Education: N/A   Social History Main Topics  . Smoking status: Never Smoker   . Smokeless tobacco: Never Used  . Alcohol Use: No  . Drug Use: No  . Sexual Activity: No   Other Topics Concern  . None   Social History Narrative  . None   History reviewed. No pertinent family history. No Known Allergies Prior to Admission medications   Medication Sig Start Date End Date Taking? Authorizing Provider  atorvastatin (LIPITOR) 20 MG tablet Take 20 mg by mouth daily.   Yes Historical Provider, MD  clonazePAM (KLONOPIN) 0.5 MG tablet Take 0.25 mg by mouth 2 (two) times daily.   Yes Historical Provider, MD  folic acid-vitamin b complex-vitamin c-selenium-zinc (DIALYVITE) 3 MG TABS tablet Take 1 tablet by mouth daily.   Yes Historical Provider, MD  lidocaine (LMX) 4 % cream Apply 1 application topically 3 (three) times a week. On Tuesday, Thursday, and Saturday   Yes Historical Provider, MD  midodrine (PROAMATINE) 10 MG tablet Take 15 mg by mouth 3 (three) times daily.    Yes Historical Provider, MD  pantoprazole (PROTONIX) 40 MG tablet Take 40 mg by mouth daily.   Yes Historical Provider, MD  phenytoin (DILANTIN) 100 MG ER capsule Take 200 mg by mouth at bedtime.   Yes Historical Provider, MD  sevelamer carbonate (RENVELA) 800 MG tablet Take 800 mg by mouth 3 (three) times daily with meals.   Yes Historical Provider, MD     Positive ROS: Unable to obtain  All other systems have been reviewed and were otherwise negative with the exception of those  mentioned in the HPI and as above.  Physical Exam: Filed Vitals:   01/25/14 0853  BP: 103/67  Pulse: 83  Temp: 97.7 F (36.5 C)  Resp: 20    General: Alert, no acute distress HEENT: Normal for age Cardiovascular: Regular rate and rhythm. Carotid pulses 2+, no bruits audible Respiratory: Clear to auscultation. No cyanosis, no use of accessory musculature GI: No organomegaly, abdomen is soft and non-tender Skin: No lesions in the area of chief complaint  Psychiatric: Patient is competent for consent with normal mood and affect Musculoskeletal: No obvious deformities Extremities: Left thigh AV graft is pulseles   Assessment/Plan:  Plan attempt at thrombectomy and revision left thigh AV graft today under general anesthesia   Josephina Gip, MD 01/25/2014 1:19 PM

## 2014-01-25 NOTE — Discharge Instructions (Signed)
° ° °  01/25/2014 [name] [id number] [birth year]  Surgeon(s): Pryor OchoaJames D Lawson, MD  Procedure(s): THROMBECTOMY AND REVISION OF LEFT THIGH ARTERIOVENTOUS (AV) GORETEX  GRAFT WITH PATCH ANGIOPLASTY  x May stick graft immediately   May stick graft on designated area only:   x Do not stick graft over incision in groin

## 2014-01-25 NOTE — Anesthesia Preprocedure Evaluation (Addendum)
Anesthesia Evaluation  Patient identified by MRN, date of birth, ID band Patient awake  General Assessment Comment:Patient genrally uncooperative c IV  Reviewed: Allergy & Precautions, H&P , NPO status , Patient's Chart, lab work & pertinent test results  Airway Mallampati: II      Dental  (+) Teeth Intact, Dental Advisory Given   Pulmonary  breath sounds clear to auscultation        Cardiovascular hypertension, + Peripheral Vascular Disease and +CHF Rhythm:Regular Rate:Normal     Neuro/Psych Seizures -,     GI/Hepatic PUD, GERD-  ,  Endo/Other    Renal/GU Renal disease     Musculoskeletal   Abdominal   Peds  Hematology   Anesthesia Other Findings   Reproductive/Obstetrics                          Anesthesia Physical Anesthesia Plan  ASA: III  Anesthesia Plan: General   Post-op Pain Management:    Induction: Intravenous and Inhalational  Airway Management Planned: LMA  Additional Equipment:   Intra-op Plan:   Post-operative Plan: Extubation in OR  Informed Consent: I have reviewed the patients History and Physical, chart, labs and discussed the procedure including the risks, benefits and alternatives for the proposed anesthesia with the patient or authorized representative who has indicated his/her understanding and acceptance.     Plan Discussed with: CRNA and Surgeon  Anesthesia Plan Comments: (Telephone consent , no family)        Anesthesia Quick Evaluation

## 2014-01-25 NOTE — Transfer of Care (Signed)
Immediate Anesthesia Transfer of Care Note  Patient: Rowe Clackeal W Braid  Procedure(s) Performed: Procedure(s): THROMBECTOMY AND REVISION OF LEFT THIGH ARTERIOVENTOUS (AV) GORETEX  GRAFT WITH PATCH ANGIOPLASTY (Left)  Patient Location: PACU  Anesthesia Type:General  Level of Consciousness: awake and alert   Airway & Oxygen Therapy: Patient Spontanous Breathing and Patient connected to nasal cannula oxygen  Post-op Assessment: Report given to PACU RN, Post -op Vital signs reviewed and stable and Patient moving all extremities X 4  Post vital signs: Reviewed and stable  Complications: No apparent anesthesia complications

## 2014-01-25 NOTE — Op Note (Signed)
OPERATIVE REPORT  Date of Surgery: 01/25/2014  Surgeon: Josephina GipJames Sally Menard, MD  Assistant: Lorrine KinSamantha Rhyne,PA  Pre-op Diagnosis: End Stage Renal Disease with recurrent thrombosis left thigh AV graft  Post-op Diagnosis-same  Procedure: Procedure(s): THROMBECTOMY AND REVISION OF LEFT THIGH ARTERIOVENTOUS (AV) GORETEX  GRAFT WITH PATCH ANGIOPLASTY of venous anastomosis  Anesthesia: LMA  EBL: Minimal  Complications: None  Procedure Details: Patient was taken to the operating room placed in supine position at which time satisfactory Gen.-LMA anesthesia was measured. Left thigh and groin area were prepped Betadine scrub and solution draped in routine sterile manner. Longitudinal incision was made in the left inguinal area through the previous scar. The Gore-Tex to femoral vein anastomosis was dissected free. Proximal and distal control the common femoral vein was obtained. The graft is pulseless. No heparin was used. Longitudinal opening was made in the graft overlying the venous anastomosis. There was a tight stenosis at the venous anastomosis which was very focal in nature. The graft was filled with fresh thrombus. 5 Fogarty catheter was passed proximally around to the arterial anastomosis without difficulty excellent inflow reestablished. The longitudinal opening was extended through the tip of the anastomosis up into the native common femoral vein proximally and back through the graft distally over a distance of about 5-6 cm. this was then repaired using a Dacron patch which was fashioned appropriately and sewn in place with 6-0 Prolene. Prior to completion of this appropriate flushing was performed anastomosis completed and there was excellent pulse and thrill in the graft and good Doppler flow. No protamine was given. Adequate hemostasis achieved wound closed in layers with Vicryl in a subcuticular fashion with Dermabond patient in the recovery room in stable condition   Josephina GipJames Emelina Hinch,  MD 01/25/2014 3:06 PM

## 2014-01-26 NOTE — Anesthesia Postprocedure Evaluation (Signed)
  Anesthesia Post-op Note  Patient: [name]  Procedure(s) Performed: Procedure(s): THROMBECTOMY AND REVISION OF LEFT THIGH ARTERIOVENTOUS (AV) GORETEX  GRAFT WITH PATCH ANGIOPLASTY (Left)  Patient Location: PACU  Anesthesia Type:General  Level of Consciousness: awake and alert   Airway and Oxygen Therapy: Patient Spontanous Breathing  Post-op Pain: none  Post-op Assessment: Post-op Vital signs reviewed  Post-op Vital Signs: stable  Last Vitals:  Filed Vitals:   01/25/14 1600  BP:   Pulse:   Temp: 36.1 C  Resp:     Complications: No apparent anesthesia complications

## 2014-01-30 ENCOUNTER — Encounter (HOSPITAL_COMMUNITY): Payer: Self-pay | Admitting: Vascular Surgery

## 2014-02-02 ENCOUNTER — Other Ambulatory Visit: Payer: Self-pay | Admitting: Neurology

## 2014-03-01 ENCOUNTER — Ambulatory Visit (INDEPENDENT_AMBULATORY_CARE_PROVIDER_SITE_OTHER): Payer: Medicare Other | Admitting: Nurse Practitioner

## 2014-03-01 ENCOUNTER — Encounter: Payer: Self-pay | Admitting: Nurse Practitioner

## 2014-03-01 VITALS — BP 85/57 | HR 104 | Ht 60.0 in | Wt 118.0 lb

## 2014-03-01 DIAGNOSIS — F79 Unspecified intellectual disabilities: Secondary | ICD-10-CM

## 2014-03-01 DIAGNOSIS — G40309 Generalized idiopathic epilepsy and epileptic syndromes, not intractable, without status epilepticus: Secondary | ICD-10-CM

## 2014-03-01 DIAGNOSIS — R4701 Aphasia: Secondary | ICD-10-CM

## 2014-03-01 NOTE — Progress Notes (Signed)
I have read the note, and I agree with the clinical assessment and plan.  Charles K Willis   

## 2014-03-01 NOTE — Progress Notes (Signed)
[facility]  PATIENT: [name] DOB: [birth year]   REASON FOR VISIT: Followup for generalized seizure disorder   HISTORY OF PRESENT ILLNESS: Mr. Wellmann, 63 year old male returns for followup. He has a history of generalized seizure disorder but caregiver claims he has not had seizures since last seen which was May of last year. He is currently on Dilantin 200 mg at night. 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 He is able to feed himself but he requires 24/7 care. He is also on dialysis for end-stage renal disease. At baseline he has a gait instability , he has not had recent falls.. There has been no bowel or bladder incontinence. He returns for reevaluation.  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 He has a history of a seizure disorder that began 2-1/2 years ago requiring hospitalization. He was placed on clonazepam but was never on anticonvulsant medications. He was placed on Dilantin by Dr. Anne Hahn, currently 200 mg at night. The caregiver denies that he has had further jerking activity since being on the medication. EEG was normal. At baseline the patient has 24/ 7 care but he is able to feed himself. He has a baseline gait instability and can fall on occasion, no recent falls. CT of the head done 2010 showed atrophy without acute changes. Patient also has end-stage renal disease and goes to dialysis 3 times a week. Date of last seizure November 2013. Caregiver denies any staring spells, confusion, sleep disturbances, lapses of time, headache and bowel and bladder incontinence.     REVIEW OF SYSTEMS: Full 14 system review of systems performed and notable only for those listed, all others are neg:  Constitutional: N/A  Cardiovascular: N/A  Ear/Nose/Throat: N/A  Skin: N/A  Eyes: N/A  Respiratory: N/A  Gastroitestinal: N/A  Hematology/Lymphatic: N/A  Endocrine: N/A Musculoskeletal:N/A  Allergy/Immunology: N/A    Neurological: N/A Psychiatric: N/A Sleep : NA   ALLERGIES: No Known Allergies  HOME MEDICATIONS: Outpatient Prescriptions Prior to Visit  Medication Sig Dispense Refill  . atorvastatin (LIPITOR) 20 MG tablet Take 20 mg by mouth daily.      . clonazePAM (KLONOPIN) 0.5 MG tablet Take 0.25 mg by mouth 2 (two) times daily.      . folic acid-vitamin b complex-vitamin c-selenium-zinc (DIALYVITE) 3 MG TABS tablet Take 1 tablet by mouth daily.      Marland Kitchen lidocaine (LMX) 4 % cream Apply 1 application topically 3 (three) times a week. On Tuesday, Thursday, and Saturday      . midodrine (PROAMATINE) 10 MG tablet Take 15 mg by mouth 3 (three) times daily.       Marland Kitchen oxyCODONE (ROXICODONE) 5 MG immediate release tablet Take 1 tablet (5 mg total) by mouth every 6 (six) hours as needed for severe pain.  30 tablet  0  . pantoprazole (PROTONIX) 40 MG tablet Take 40 mg by mouth daily.      . phenytoin (DILANTIN) 100 MG ER capsule Take 2 capsules (200 mg total) by mouth at bedtime.  60 capsule  0  . sevelamer carbonate (RENVELA) 800 MG tablet Take 800 mg by mouth 3 (three) times daily with meals.       No facility-administered medications prior to visit.    PAST MEDICAL HISTORY: Past Medical History  Diagnosis Date  . Heart murmur, systolic 08/10/2009  . Syncope 05/07/2009  . 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 Esophageal varices 11/07/2008  . Gastric ulcer  10/09    antral, with h pylori positive  . Congestive heart failure 03/06/2008  . Cellulitis and abscess of leg, except foot 03/06/2008  . Secondary hyperparathyroidism 02/02/2008  . Mute 02/02/2008  . Hyperlipidemia 02/02/2008  . Anemia 02/02/2008  . ESRD (end stage renal disease)     TTS hemodialysis  . GERD (gastroesophageal reflux disease)   . Hypertension 02/02/2008    in history  . Mental retardation 02/02/2008  . Mute   . Seizures     "non in a while at home". none in past year .    PAST SURGICAL HISTORY: Past Surgical  History  Procedure Laterality Date  . Left forearm graft      for HD  . Arteriovenous graft placement  11/22/10    Right thigh AVG  . Thrombectomy and revision of arterioventous (av) goretex  graft    . Thrombectomy and revision of arterioventous (av) goretex  graft  10/22/2012    Procedure: THROMBECTOMY AND REVISION OF ARTERIOVENTOUS (AV) GORETEX  GRAFT;  Surgeon: Larina Earthly, MD;  [facility] OR;  Service: Vascular;  Laterality: Right;  . Thrombectomy w/ embolectomy  11/10/2012    Procedure: THROMBECTOMY ARTERIOVENOUS GORE-TEX GRAFT;  Surgeon: Pryor Ochoa, MD;  [facility];  Service: Vascular;  Laterality: Right;  . Thrombectomy and revision of arterioventous (av) goretex  graft Right 12/08/2012    Procedure: THROMBECTOMY AND REVISION OF ARTERIOVENTOUS (AV) GORETEX  GRAFT right thigh;  Surgeon: Sherren Kerns, MD;  Location: [facility] OR;  Service: Vascular;  Laterality: Right;  Susie Cassette N/A 12/08/2012    Procedure: VENOGRAM;  Surgeon: Sherren Kerns, MD;  Location: [facility] OR;  Service: Vascular;  Laterality: N/A;  Intraoperative Central venogram  . Thrombectomy w/ embolectomy Right 12/12/2012    Procedure: THROMBECTOMY ARTERIOVENOUS GORE-TEX GRAFT;  Surgeon: Nada Libman, MD;  Location: [facility] OR;  Service: Vascular;  Laterality: Right;  . Insertion of dialysis catheter Left 12/14/2012    Procedure: INSERTION OF DIALYSIS CATHETER;  Surgeon: Nada Libman, MD;  Location: [facility] OR;  Service: Vascular;  Laterality: Left;  . Insertion of dialysis catheter Right 01/13/2013    Procedure: INSERTION OF DIALYSIS CATHETER;  Surgeon: Nada Libman, MD;  Location: [facility] OR;  Service: Vascular;  Laterality: Right;  . Removal of a dialysis catheter Left 01/13/2013    Procedure: REMOVAL OF A DIALYSIS CATHETER;  Surgeon: Nada Libman, MD;  Location: [facility] OR;  Service: Vascular;  Laterality: Left;  . Av fistula placement Left 02/11/2013    Procedure: INSERTION OF ARTERIOVENOUS (AV) GORE-TEX GRAFT THIGH;  Surgeon:  Nada Libman, MD;  Location: [facility] OR;  Service: Vascular;  Laterality: Left;  using 6mm x 50cm Gore-Tex Vascular Graft  . Esophagogastroduodenoscopy N/A 02/16/2013    Procedure: ESOPHAGOGASTRODUODENOSCOPY (EGD);  Surgeon: Vertell Novak., MD;  Location: [facility] ENDOSCOPY;  Service: Endoscopy;  Laterality: N/A;  bedside  . Esophagogastroduodenoscopy N/A 09/14/2013    Procedure: ESOPHAGOGASTRODUODENOSCOPY (EGD);  Surgeon: Vertell Novak., MD;  Location: [facility] ENDOSCOPY;  Service: Endoscopy;  Laterality: N/A;  control of bleeding if needed  . Esophagogastroduodenoscopy N/A 10/03/2013    Procedure: ESOPHAGOGASTRODUODENOSCOPY (EGD);  Surgeon: Theda Belfast, MD;  Location: [facility] ENDOSCOPY;  Service: Endoscopy;  Laterality: N/A;  Bedside  . Radiology with anesthesia Right 10/19/2013    Procedure: RADIOLOGY WITH ANESTHESIA;  Surgeon: Malachy Moan, MD;  Location: [facility] OR;  Service: Radiology;  Laterality: Right;  . Radiology with anesthesia Left 12/28/2013  Procedure: RADIOLOGY WITH ANESTHESIA;  Surgeon: Reola Calkins, MD;  Location[facility] OR;  Service: Radiology;  Laterality: Left;  . Thrombectomy and revision of arterioventous (av) goretex  graft Left 01/25/2014    Procedure: THROMBECTOMY AND REVISION OF LEFT THIGH ARTERIOVENTOUS (AV) GORETEX  GRAFT WITH PATCH ANGIOPLASTY;  Surgeon: Pryor Ochoa, MD;  Location: [facility] OR;  Service: Vascular;  Laterality: Left;    FAMILY HISTORY: History reviewed. No pertinent family history.  SOCIAL HISTORY: History   Social History  . Marital Status: Single    Spouse Name: N/A    Number of Children: 0  . Years of Education: N/A   Occupational History  .     Social History Main Topics  . Smoking status: Never Smoker   . Smokeless tobacco: Never Used  . Alcohol Use: No  . Drug Use: No  . Sexual Activity: No   Other Topics Concern  . Not on file   Social History Narrative   Patient is living with care providers.    Patient is right handed.    Patient  does not have any children.    Patient is on disability            PHYSICAL EXAM  Filed Vitals:   03/01/14 1056  BP: 85/57  Pulse: 104  Height: 5' (1.524 m)  Weight: 118 lb (53.524 kg)   Body mass index is 23.05 kg/(m^2).  Generalized: Well developed, in no acute distress  Head: normocephalic and atraumatic,. Oropharynx benign  Neck: Supple, no carotid bruits  Cardiac: Regular rate rhythm, no murmur    Neurological examination   Mentation: Alert but mute, MR .Does not follow all commands.   Cranial nerve II-XII: Pupils were equal round reactive to light, bilateral proptosis extraocular movements were full, patient blinks to threat.Disc were not evaluated.  Uvula tongue midline. Motor: Appears to have good strength, the patient moves all 4 extremities  Sensory: Withdraws to pain Coordination: Unable to perform  Reflexes: Symmetric upper and lower progress, plantar responses were neutral  Gait and Station: Rising up from seated position with assistance, unsteady gait, stooped posture Unable to perform tandem gait.  DIAGNOSTIC DATA (LABS, IMAGING, TESTING) - I reviewed patient records, labs, notes, testing and imaging myself where available.  Lab Results  Component Value Date   WBC 6.2 10/06/2013   HGB 13.6 12/28/2013   HCT 40.0 12/28/2013   MCV 94.9 10/06/2013   PLT 158 10/06/2013      Component Value Date/Time   NA 150* 12/28/2013 1448   K 5.3 12/28/2013 1448   CL 98 10/06/2013 1514   CO2 23 10/06/2013 1514   GLUCOSE 79 12/28/2013 1448   BUN 19 10/06/2013 1514   CREATININE 5.62* 10/06/2013 1514   CALCIUM 9.7 10/06/2013 1514   PROT 7.2 10/02/2013 0305   ALBUMIN 3.3* 10/06/2013 1514   AST 15 10/02/2013 0305   ALT 15 10/02/2013 0305   ALKPHOS 134* 10/02/2013 0305   BILITOT 0.2* 10/02/2013 0305   GFRNONAA 10* 10/06/2013 1514   GFRAA 11* 10/06/2013 1514     ASSESSMENT AND PLAN  63 y.o. year old male  has a past medical history of generalized seizure disorder  currently on Dilantin 200 mg at bedtime with last seizure being in November 2013. Profound mental retardation. Mutism. Gait disorder, end-stage renal disease  Continue Dilantin at current dose, will renew once labs back Will check free and total level at the next blood draw at dialysis, RX to caregiver Call  for any seizure activity Follow up yearly and when necessary Nilda Riggs, [facility], [facility], APRN  [facility] [address] [place], [place] [address] [phone]

## 2014-03-01 NOTE — Patient Instructions (Signed)
Continue Dilantin at current dose, will renew once labs back Will check free and total level at the next blood draw at dialysis Call for any seizure activity Follow up yearly and when necessary

## 2014-03-03 ENCOUNTER — Other Ambulatory Visit: Payer: Self-pay | Admitting: Neurology

## 2014-03-21 ENCOUNTER — Telehealth: Payer: Self-pay | Admitting: Neurology

## 2014-03-21 NOTE — Telephone Encounter (Signed)
The primary care physician has done blood work, the Dilantin total level was only 1.8, free level 0.3. Not clear from this bloodwork whether or not the patient is taking the medication. By history, it does not sound like the patient has had a recent seizure, last one noted was in 2013.

## 2014-04-07 ENCOUNTER — Other Ambulatory Visit: Payer: Self-pay | Admitting: Neurology

## 2014-04-10 ENCOUNTER — Inpatient Hospital Stay (HOSPITAL_COMMUNITY): Payer: Medicare Other

## 2014-04-10 ENCOUNTER — Encounter (HOSPITAL_COMMUNITY)
Admission: AD | Disposition: A | Discharge status: Home / Self Care | Payer: Self-pay | Source: Ambulatory Visit | Attending: Vascular Surgery

## 2014-04-10 ENCOUNTER — Encounter (HOSPITAL_COMMUNITY): Payer: Medicare Other | Admitting: Anesthesiology

## 2014-04-10 ENCOUNTER — Encounter (HOSPITAL_COMMUNITY): Payer: Self-pay

## 2014-04-10 ENCOUNTER — Inpatient Hospital Stay (HOSPITAL_COMMUNITY)
Admission: AD | Admit: 2014-04-10 | Discharge: 2014-04-27 | DRG: 264 | Disposition: A | Discharge status: Home / Self Care | Payer: Medicare Other | Source: Ambulatory Visit | Attending: Oncology | Admitting: Oncology

## 2014-04-10 ENCOUNTER — Inpatient Hospital Stay (HOSPITAL_COMMUNITY): Payer: Medicare Other | Admitting: Anesthesiology

## 2014-04-10 DIAGNOSIS — R578 Other shock: Secondary | ICD-10-CM | POA: Diagnosis not present

## 2014-04-10 DIAGNOSIS — G9340 Encephalopathy, unspecified: Secondary | ICD-10-CM | POA: Diagnosis not present

## 2014-04-10 DIAGNOSIS — K219 Gastro-esophageal reflux disease without esophagitis: Secondary | ICD-10-CM | POA: Diagnosis present

## 2014-04-10 DIAGNOSIS — T82898A Other specified complication of vascular prosthetic devices, implants and grafts, initial encounter: Principal | ICD-10-CM | POA: Diagnosis present

## 2014-04-10 DIAGNOSIS — I120 Hypertensive chronic kidney disease with stage 5 chronic kidney disease or end stage renal disease: Secondary | ICD-10-CM | POA: Diagnosis present

## 2014-04-10 DIAGNOSIS — G40909 Epilepsy, unspecified, not intractable, without status epilepticus: Secondary | ICD-10-CM | POA: Diagnosis present

## 2014-04-10 DIAGNOSIS — I70209 Unspecified atherosclerosis of native arteries of extremities, unspecified extremity: Secondary | ICD-10-CM | POA: Diagnosis present

## 2014-04-10 DIAGNOSIS — Z515 Encounter for palliative care: Secondary | ICD-10-CM

## 2014-04-10 DIAGNOSIS — R112 Nausea with vomiting, unspecified: Secondary | ICD-10-CM | POA: Diagnosis not present

## 2014-04-10 DIAGNOSIS — R404 Transient alteration of awareness: Secondary | ICD-10-CM | POA: Diagnosis not present

## 2014-04-10 DIAGNOSIS — T82868A Thrombosis of vascular prosthetic devices, implants and grafts, initial encounter: Secondary | ICD-10-CM

## 2014-04-10 DIAGNOSIS — N186 End stage renal disease: Secondary | ICD-10-CM | POA: Diagnosis present

## 2014-04-10 DIAGNOSIS — R7309 Other abnormal glucose: Secondary | ICD-10-CM | POA: Diagnosis not present

## 2014-04-10 DIAGNOSIS — I8500 Esophageal varices without bleeding: Secondary | ICD-10-CM | POA: Diagnosis not present

## 2014-04-10 DIAGNOSIS — Z992 Dependence on renal dialysis: Secondary | ICD-10-CM

## 2014-04-10 DIAGNOSIS — N2581 Secondary hyperparathyroidism of renal origin: Secondary | ICD-10-CM | POA: Diagnosis present

## 2014-04-10 DIAGNOSIS — G40309 Generalized idiopathic epilepsy and epileptic syndromes, not intractable, without status epilepticus: Secondary | ICD-10-CM

## 2014-04-10 DIAGNOSIS — T380X5A Adverse effect of glucocorticoids and synthetic analogues, initial encounter: Secondary | ICD-10-CM | POA: Diagnosis not present

## 2014-04-10 DIAGNOSIS — T82868D Thrombosis of vascular prosthetic devices, implants and grafts, subsequent encounter: Secondary | ICD-10-CM

## 2014-04-10 DIAGNOSIS — R579 Shock, unspecified: Secondary | ICD-10-CM

## 2014-04-10 DIAGNOSIS — Y841 Kidney dialysis as the cause of abnormal reaction of the patient, or of later complication, without mention of misadventure at the time of the procedure: Secondary | ICD-10-CM | POA: Diagnosis present

## 2014-04-10 DIAGNOSIS — E2749 Other adrenocortical insufficiency: Secondary | ICD-10-CM | POA: Diagnosis present

## 2014-04-10 DIAGNOSIS — D696 Thrombocytopenia, unspecified: Secondary | ICD-10-CM | POA: Diagnosis present

## 2014-04-10 DIAGNOSIS — G8918 Other acute postprocedural pain: Secondary | ICD-10-CM | POA: Diagnosis not present

## 2014-04-10 DIAGNOSIS — R4701 Aphasia: Secondary | ICD-10-CM

## 2014-04-10 DIAGNOSIS — I871 Compression of vein: Secondary | ICD-10-CM | POA: Diagnosis present

## 2014-04-10 DIAGNOSIS — E875 Hyperkalemia: Secondary | ICD-10-CM | POA: Diagnosis present

## 2014-04-10 DIAGNOSIS — R569 Unspecified convulsions: Secondary | ICD-10-CM

## 2014-04-10 DIAGNOSIS — Z66 Do not resuscitate: Secondary | ICD-10-CM | POA: Diagnosis not present

## 2014-04-10 DIAGNOSIS — N039 Chronic nephritic syndrome with unspecified morphologic changes: Secondary | ICD-10-CM

## 2014-04-10 DIAGNOSIS — I959 Hypotension, unspecified: Secondary | ICD-10-CM

## 2014-04-10 DIAGNOSIS — D500 Iron deficiency anemia secondary to blood loss (chronic): Secondary | ICD-10-CM

## 2014-04-10 DIAGNOSIS — D631 Anemia in chronic kidney disease: Secondary | ICD-10-CM | POA: Diagnosis present

## 2014-04-10 DIAGNOSIS — I7092 Chronic total occlusion of artery of the extremities: Secondary | ICD-10-CM | POA: Diagnosis present

## 2014-04-10 DIAGNOSIS — F79 Unspecified intellectual disabilities: Secondary | ICD-10-CM

## 2014-04-10 DIAGNOSIS — D62 Acute posthemorrhagic anemia: Secondary | ICD-10-CM | POA: Diagnosis not present

## 2014-04-10 HISTORY — PX: PATCH ANGIOPLASTY: SHX6230

## 2014-04-10 HISTORY — DX: Other complications of anesthesia, initial encounter: T88.59XA

## 2014-04-10 HISTORY — PX: THROMBECTOMY AND REVISION OF ARTERIOVENTOUS (AV) GORETEX  GRAFT: SHX6120

## 2014-04-10 HISTORY — DX: Adverse effect of unspecified anesthetic, initial encounter: T41.45XA

## 2014-04-10 LAB — CBC
HCT: 21.4 % — ABNORMAL LOW (ref 39.0–52.0)
HEMOGLOBIN: 6.8 g/dL — AB (ref 13.0–17.0)
MCH: 30.9 pg (ref 26.0–34.0)
MCHC: 31.8 g/dL (ref 30.0–36.0)
MCV: 97.3 fL (ref 78.0–100.0)
PLATELETS: 177 10*3/uL (ref 150–400)
RBC: 2.2 MIL/uL — ABNORMAL LOW (ref 4.22–5.81)
RDW: 14.9 % (ref 11.5–15.5)
WBC: 9 10*3/uL (ref 4.0–10.5)

## 2014-04-10 LAB — COMPREHENSIVE METABOLIC PANEL
ALBUMIN: 3.2 g/dL — AB (ref 3.5–5.2)
ALT: 11 U/L (ref 0–53)
AST: 15 U/L (ref 0–37)
Alkaline Phosphatase: 97 U/L (ref 39–117)
BUN: 60 mg/dL — ABNORMAL HIGH (ref 6–23)
CALCIUM: 8.7 mg/dL (ref 8.4–10.5)
CO2: 18 mEq/L — ABNORMAL LOW (ref 19–32)
Chloride: 107 mEq/L (ref 96–112)
Creatinine, Ser: 13.24 mg/dL — ABNORMAL HIGH (ref 0.50–1.35)
GFR calc non Af Amer: 3 mL/min — ABNORMAL LOW (ref >90)
GFR, EST AFRICAN AMERICAN: 4 mL/min — AB (ref >90)
Glucose, Bld: 177 mg/dL — ABNORMAL HIGH (ref 70–99)
Potassium: 5.3 mEq/L (ref 3.7–5.3)
Sodium: 149 mEq/L — ABNORMAL HIGH (ref 137–147)
TOTAL PROTEIN: 5.3 g/dL — AB (ref 6.0–8.3)
Total Bilirubin: 0.3 mg/dL (ref 0.3–1.2)

## 2014-04-10 LAB — POCT I-STAT 4, (NA,K, GLUC, HGB,HCT)
GLUCOSE: 173 mg/dL — AB (ref 70–99)
Glucose, Bld: 90 mg/dL (ref 70–99)
HEMATOCRIT: 35 % — AB (ref 39.0–52.0)
HEMATOCRIT: 29 % — AB (ref 39.0–52.0)
Hemoglobin: 11.9 g/dL — ABNORMAL LOW (ref 13.0–17.0)
Hemoglobin: 9.9 g/dL — ABNORMAL LOW (ref 13.0–17.0)
POTASSIUM: 6.5 meq/L — AB (ref 3.7–5.3)
Potassium: 6.1 mEq/L — ABNORMAL HIGH (ref 3.7–5.3)
SODIUM: 142 meq/L (ref 137–147)
Sodium: 141 mEq/L (ref 137–147)

## 2014-04-10 LAB — TROPONIN I

## 2014-04-10 LAB — LACTIC ACID, PLASMA: Lactic Acid, Venous: 5.4 mmol/L — ABNORMAL HIGH (ref 0.5–2.2)

## 2014-04-10 LAB — PREPARE RBC (CROSSMATCH)

## 2014-04-10 LAB — GLUCOSE, CAPILLARY: Glucose-Capillary: 136 mg/dL — ABNORMAL HIGH (ref 70–99)

## 2014-04-10 SURGERY — THROMBECTOMY AND REVISION OF ARTERIOVENTOUS (AV) GORETEX  GRAFT
Anesthesia: General | Site: Leg Upper | Laterality: Left

## 2014-04-10 MED ORDER — FENTANYL CITRATE 0.05 MG/ML IJ SOLN
INTRAMUSCULAR | Status: AC
  Filled 2014-04-10: qty 5

## 2014-04-10 MED ORDER — LABETALOL HCL 5 MG/ML IV SOLN: 10.0000 mg | INTRAVENOUS | Status: DC | PRN

## 2014-04-10 MED ORDER — PHENYLEPHRINE HCL 10 MG/ML IJ SOLN
INTRAMUSCULAR | Status: AC
  Filled 2014-04-10: qty 1

## 2014-04-10 MED ORDER — ONDANSETRON HCL 4 MG/2ML IJ SOLN
4.0000 mg | Freq: Four times a day (QID) | INTRAMUSCULAR | Status: DC | PRN
  Administered 2014-04-14 – 2014-04-15 (×2): 4 mg via INTRAVENOUS
  Filled 2014-04-10 (×3): qty 2

## 2014-04-10 MED ORDER — ACETAMINOPHEN 325 MG PO TABS: 650.0000 mg | ORAL_TABLET | ORAL | Status: DC | PRN

## 2014-04-10 MED ORDER — DIALYVITE 3000 3 MG PO TABS
1.0000 | ORAL_TABLET | Freq: Every day | ORAL | Status: DC
Start: 2014-04-10 — End: 2014-04-10

## 2014-04-10 MED ORDER — ACETAMINOPHEN 650 MG RE SUPP: 325.0000 mg | RECTAL | Status: DC | PRN

## 2014-04-10 MED ORDER — HEPARIN SODIUM (PORCINE) 1000 UNIT/ML IJ SOLN
INTRAMUSCULAR | Status: AC
  Filled 2014-04-10: qty 1

## 2014-04-10 MED ORDER — HYDROCORTISONE SOD SUCCINATE 100 MG IJ SOLR
50.0000 mg | Freq: Four times a day (QID) | INTRAMUSCULAR | Status: DC
  Administered 2014-04-10 – 2014-04-23 (×50): 50 mg via INTRAVENOUS
  Filled 2014-04-10 (×56): qty 1

## 2014-04-10 MED ORDER — PRISMASOL BGK 4/2.5 32-4-2.5 MEQ/L IV SOLN
INTRAVENOUS | Status: DC
  Administered 2014-04-10 – 2014-04-11 (×4): via INTRAVENOUS_CENTRAL
  Filled 2014-04-10 (×8): qty 5000

## 2014-04-10 MED ORDER — PHENYLEPHRINE 40 MCG/ML (10ML) SYRINGE FOR IV PUSH (FOR BLOOD PRESSURE SUPPORT)
PREFILLED_SYRINGE | INTRAVENOUS | Status: AC
  Filled 2014-04-10: qty 10

## 2014-04-10 MED ORDER — SODIUM CHLORIDE 0.9 % IR SOLN
Status: DC | PRN
  Administered 2014-04-10: 10:00:00

## 2014-04-10 MED ORDER — RENA-VITE PO TABS
1.0000 | ORAL_TABLET | Freq: Every day | ORAL | Status: DC
  Administered 2014-04-13 – 2014-04-20 (×6): 1 via ORAL
  Administered 2014-04-21 – 2014-04-23 (×3): via ORAL
  Administered 2014-04-24 – 2014-04-26 (×3): 1 via ORAL
  Filled 2014-04-10 (×19): qty 1

## 2014-04-10 MED ORDER — PROTAMINE SULFATE 10 MG/ML IV SOLN
INTRAVENOUS | Status: DC | PRN
  Administered 2014-04-10 (×3): 10 mg via INTRAVENOUS
  Administered 2014-04-10: 20 mg via INTRAVENOUS

## 2014-04-10 MED ORDER — CALCIUM CHLORIDE 10 % IV SOLN
INTRAVENOUS | Status: DC | PRN
  Administered 2014-04-10 (×2): 500 mg via INTRAVENOUS

## 2014-04-10 MED ORDER — CEFAZOLIN SODIUM-DEXTROSE 2-3 GM-% IV SOLR
INTRAVENOUS | Status: AC
  Filled 2014-04-10: qty 50

## 2014-04-10 MED ORDER — PROPOFOL 10 MG/ML IV BOLUS
INTRAVENOUS | Status: DC | PRN
  Administered 2014-04-10: 20 mg via INTRAVENOUS
  Administered 2014-04-10: 80 mg via INTRAVENOUS

## 2014-04-10 MED ORDER — SODIUM CHLORIDE 0.9 % IV SOLN
1.0000 g | Freq: Once | INTRAVENOUS | Status: DC
  Filled 2014-04-10: qty 10

## 2014-04-10 MED ORDER — SODIUM CHLORIDE 0.9 % IV SOLN
200.0000 mg | Freq: Every day | INTRAVENOUS | Status: DC
  Administered 2014-04-10 – 2014-04-18 (×9): 200 mg via INTRAVENOUS
  Filled 2014-04-10 (×14): qty 4

## 2014-04-10 MED ORDER — ACETAMINOPHEN 325 MG PO TABS: 325.0000 mg | ORAL_TABLET | ORAL | Status: DC | PRN

## 2014-04-10 MED ORDER — SENNA 8.6 MG PO TABS
1.0000 | ORAL_TABLET | Freq: Two times a day (BID) | ORAL | Status: DC
  Administered 2014-04-13 – 2014-04-27 (×22): 8.6 mg via ORAL
  Filled 2014-04-10 (×36): qty 1

## 2014-04-10 MED ORDER — PANTOPRAZOLE SODIUM 40 MG IV SOLR
40.0000 mg | INTRAVENOUS | Status: DC
  Administered 2014-04-10 – 2014-04-13 (×4): 40 mg via INTRAVENOUS
  Filled 2014-04-10 (×5): qty 40

## 2014-04-10 MED ORDER — OXYCODONE HCL 5 MG PO TABS: 5.0000 mg | ORAL_TABLET | ORAL | Status: DC | PRN

## 2014-04-10 MED ORDER — MORPHINE SULFATE 2 MG/ML IJ SOLN: 2.0000 mg | INTRAMUSCULAR | Status: DC | PRN

## 2014-04-10 MED ORDER — ALBUTEROL SULFATE HFA 108 (90 BASE) MCG/ACT IN AERS
INHALATION_SPRAY | RESPIRATORY_TRACT | Status: AC
  Filled 2014-04-10: qty 6.7

## 2014-04-10 MED ORDER — FENTANYL CITRATE 0.05 MG/ML IJ SOLN
50.0000 ug | INTRAMUSCULAR | Status: DC | PRN
  Administered 2014-04-15: 100 ug via INTRAVENOUS
  Administered 2014-04-18: 50 ug via INTRAVENOUS
  Filled 2014-04-10 (×2): qty 2

## 2014-04-10 MED ORDER — SODIUM POLYSTYRENE SULFONATE 15 GM/60ML PO SUSP
30.0000 g | ORAL | Status: DC
  Filled 2014-04-10: qty 120

## 2014-04-10 MED ORDER — PROPOFOL 10 MG/ML IV BOLUS
INTRAVENOUS | Status: AC
  Filled 2014-04-10: qty 20

## 2014-04-10 MED ORDER — FENTANYL CITRATE 0.05 MG/ML IJ SOLN: 25.0000 ug | INTRAMUSCULAR | Status: DC | PRN

## 2014-04-10 MED ORDER — PRISMASOL BGK 4/2.5 32-4-2.5 MEQ/L IV SOLN
INTRAVENOUS | Status: DC
  Administered 2014-04-10: 23:00:00 via INTRAVENOUS_CENTRAL
  Filled 2014-04-10: qty 5000

## 2014-04-10 MED ORDER — ATORVASTATIN CALCIUM 20 MG PO TABS
20.0000 mg | ORAL_TABLET | Freq: Every day | ORAL | Status: DC
  Administered 2014-04-13 – 2014-04-26 (×13): 20 mg via ORAL
  Filled 2014-04-10 (×19): qty 1

## 2014-04-10 MED ORDER — MIDODRINE HCL 5 MG PO TABS
15.0000 mg | ORAL_TABLET | Freq: Three times a day (TID) | ORAL | Status: DC
  Administered 2014-04-12 – 2014-04-16 (×9): 15 mg via ORAL
  Administered 2014-04-17: 7.5 mg via ORAL
  Administered 2014-04-18: 5 mg via ORAL
  Filled 2014-04-10 (×26): qty 3

## 2014-04-10 MED ORDER — SODIUM CHLORIDE 0.9 % IV SOLN
INTRAVENOUS | Status: DC | PRN
  Administered 2014-04-10: 15:00:00 via INTRAVENOUS

## 2014-04-10 MED ORDER — OXYCODONE HCL 5 MG PO TABS: 5.0000 mg | ORAL_TABLET | Freq: Once | ORAL | Status: DC | PRN

## 2014-04-10 MED ORDER — CEFAZOLIN SODIUM-DEXTROSE 2-3 GM-% IV SOLR
2.0000 g | Freq: Once | INTRAVENOUS | Status: AC
  Administered 2014-04-10: 2 g via INTRAVENOUS

## 2014-04-10 MED ORDER — 0.9 % SODIUM CHLORIDE (POUR BTL) OPTIME
TOPICAL | Status: DC | PRN
  Administered 2014-04-10: 1000 mL

## 2014-04-10 MED ORDER — NOREPINEPHRINE BITARTRATE 1 MG/ML IV SOLN
2.0000 ug/min | INTRAVENOUS | Status: DC
  Filled 2014-04-10: qty 4

## 2014-04-10 MED ORDER — OXYCODONE HCL 5 MG/5ML PO SOLN: 5.0000 mg | Freq: Once | ORAL | Status: DC | PRN

## 2014-04-10 MED ORDER — SODIUM CHLORIDE 0.9 % IV SOLN
INTRAVENOUS | Status: DC
  Administered 2014-04-19 – 2014-04-24 (×4): via INTRAVENOUS

## 2014-04-10 MED ORDER — HEPARIN SODIUM (PORCINE) 1000 UNIT/ML DIALYSIS
1000.0000 [IU] | INTRAMUSCULAR | Status: DC | PRN
  Administered 2014-04-11: 3000 [IU] via INTRAVENOUS_CENTRAL
  Administered 2014-04-11: 5000 [IU] via INTRAVENOUS_CENTRAL
  Filled 2014-04-10 (×3): qty 3

## 2014-04-10 MED ORDER — HYDRALAZINE HCL 20 MG/ML IJ SOLN: 10.0000 mg | INTRAMUSCULAR | Status: DC | PRN

## 2014-04-10 MED ORDER — HEPARIN SODIUM (PORCINE) 1000 UNIT/ML IJ SOLN
INTRAMUSCULAR | Status: DC | PRN
  Administered 2014-04-10 (×3): 5 mL via INTRAVENOUS

## 2014-04-10 MED ORDER — PHENYLEPHRINE HCL 10 MG/ML IJ SOLN
10.0000 mg | INTRAMUSCULAR | Status: DC | PRN
  Administered 2014-04-10: 40 ug/min via INTRAVENOUS
  Administered 2014-04-10 (×2): 100 ug/min via INTRAVENOUS
  Administered 2014-04-10: 150 ug/min via INTRAVENOUS

## 2014-04-10 MED ORDER — CINACALCET HCL 30 MG PO TABS
30.0000 mg | ORAL_TABLET | Freq: Two times a day (BID) | ORAL | Status: DC
  Administered 2014-04-14 – 2014-04-27 (×21): 30 mg via ORAL
  Filled 2014-04-10 (×38): qty 1

## 2014-04-10 MED ORDER — ALBUMIN HUMAN 5 % IV SOLN
INTRAVENOUS | Status: DC | PRN
  Administered 2014-04-10 (×2): via INTRAVENOUS

## 2014-04-10 MED ORDER — PHENOL 1.4 % MT LIQD: 1.0000 | OROMUCOSAL | Status: DC | PRN

## 2014-04-10 MED ORDER — LIDOCAINE HCL (PF) 1 % IJ SOLN
INTRAMUSCULAR | Status: AC
  Filled 2014-04-10: qty 30

## 2014-04-10 MED ORDER — SODIUM CHLORIDE 0.9 % FOR CRRT
INTRAVENOUS_CENTRAL | Status: DC | PRN
  Administered 2014-04-11: 18:00:00 via INTRAVENOUS_CENTRAL
  Filled 2014-04-10: qty 1000

## 2014-04-10 MED ORDER — PHENYLEPHRINE HCL 10 MG/ML IJ SOLN
INTRAMUSCULAR | Status: DC | PRN
  Administered 2014-04-10: 120 ug via INTRAVENOUS
  Administered 2014-04-10 (×3): 80 ug via INTRAVENOUS
  Administered 2014-04-10: 120 ug via INTRAVENOUS
  Administered 2014-04-10: 80 ug via INTRAVENOUS

## 2014-04-10 MED ORDER — PROMETHAZINE HCL 25 MG/ML IJ SOLN: 6.2500 mg | INTRAMUSCULAR | Status: DC | PRN

## 2014-04-10 MED ORDER — THROMBIN 20000 UNITS EX SOLR
CUTANEOUS | Status: AC
  Filled 2014-04-10: qty 20000

## 2014-04-10 MED ORDER — DEXTROSE 50 % IV SOLN
INTRAVENOUS | Status: AC
  Filled 2014-04-10: qty 50

## 2014-04-10 MED ORDER — PROTAMINE SULFATE 10 MG/ML IV SOLN
INTRAVENOUS | Status: AC
  Filled 2014-04-10: qty 5

## 2014-04-10 MED ORDER — PRISMASOL BGK 4/2.5 32-4-2.5 MEQ/L IV SOLN
INTRAVENOUS | Status: DC
  Administered 2014-04-10: 23:00:00 via INTRAVENOUS_CENTRAL
  Filled 2014-04-10 (×2): qty 5000

## 2014-04-10 MED ORDER — EPINEPHRINE HCL 0.1 MG/ML IJ SOSY
PREFILLED_SYRINGE | INTRAMUSCULAR | Status: AC
  Filled 2014-04-10: qty 10

## 2014-04-10 MED ORDER — METOPROLOL TARTRATE 1 MG/ML IV SOLN: 2.0000 mg | INTRAVENOUS | Status: DC | PRN

## 2014-04-10 MED ORDER — SODIUM CHLORIDE 0.9 % IV SOLN
INTRAVENOUS | Status: DC
  Filled 2014-04-10: qty 1

## 2014-04-10 MED ORDER — MIDAZOLAM HCL 2 MG/2ML IJ SOLN
INTRAMUSCULAR | Status: AC
  Filled 2014-04-10: qty 2

## 2014-04-10 MED ORDER — INSULIN ASPART 100 UNIT/ML ~~LOC~~ SOLN
0.0000 [IU] | SUBCUTANEOUS | Status: DC
  Administered 2014-04-10 – 2014-04-11 (×2): 2 [IU] via SUBCUTANEOUS
  Administered 2014-04-11 – 2014-04-12 (×8): 3 [IU] via SUBCUTANEOUS
  Administered 2014-04-13 (×2): 2 [IU] via SUBCUTANEOUS
  Administered 2014-04-13: 3 [IU] via SUBCUTANEOUS
  Administered 2014-04-13 – 2014-04-14 (×3): 2 [IU] via SUBCUTANEOUS
  Administered 2014-04-14: 0 [IU] via SUBCUTANEOUS
  Administered 2014-04-14 – 2014-04-15 (×3): 2 [IU] via SUBCUTANEOUS
  Administered 2014-04-15 – 2014-04-16 (×2): 3 [IU] via SUBCUTANEOUS
  Administered 2014-04-17 (×2): 2 [IU] via SUBCUTANEOUS
  Administered 2014-04-18: 3 [IU] via SUBCUTANEOUS
  Administered 2014-04-18: 2 [IU] via SUBCUTANEOUS
  Administered 2014-04-18: 3 [IU] via SUBCUTANEOUS
  Administered 2014-04-19: 5 [IU] via SUBCUTANEOUS
  Administered 2014-04-19: 3 [IU] via SUBCUTANEOUS
  Administered 2014-04-19 (×3): 2 [IU] via SUBCUTANEOUS
  Administered 2014-04-20: 5 [IU] via SUBCUTANEOUS
  Administered 2014-04-20 – 2014-04-21 (×5): 2 [IU] via SUBCUTANEOUS
  Administered 2014-04-22: 5 [IU] via SUBCUTANEOUS
  Administered 2014-04-22 – 2014-04-25 (×9): 2 [IU] via SUBCUTANEOUS
  Administered 2014-04-26: 3 [IU] via SUBCUTANEOUS
  Administered 2014-04-26 (×2): 2 [IU] via SUBCUTANEOUS
  Administered 2014-04-26: 3 [IU] via SUBCUTANEOUS
  Administered 2014-04-27: 2 [IU] via SUBCUTANEOUS
  Administered 2014-04-27: 3 [IU] via SUBCUTANEOUS

## 2014-04-10 MED ORDER — ONDANSETRON HCL 4 MG/2ML IJ SOLN
INTRAMUSCULAR | Status: AC
  Filled 2014-04-10: qty 2

## 2014-04-10 MED ORDER — SEVELAMER CARBONATE 800 MG PO TABS
800.0000 mg | ORAL_TABLET | Freq: Three times a day (TID) | ORAL | Status: DC
  Administered 2014-04-14 – 2014-04-21 (×15): 800 mg via ORAL
  Filled 2014-04-10 (×38): qty 1

## 2014-04-10 MED ORDER — EPINEPHRINE HCL 1 MG/ML IJ SOLN
INTRAMUSCULAR | Status: DC | PRN
  Administered 2014-04-10: .02 mg via INTRAVENOUS
  Administered 2014-04-10 (×3): .01 mg via INTRAVENOUS

## 2014-04-10 MED ORDER — PANTOPRAZOLE SODIUM 40 MG PO TBEC: 40.0000 mg | DELAYED_RELEASE_TABLET | Freq: Every day | ORAL | Status: DC

## 2014-04-10 MED ORDER — CLONAZEPAM 0.5 MG PO TABS
0.2500 mg | ORAL_TABLET | Freq: Two times a day (BID) | ORAL | Status: DC
  Administered 2014-04-13 – 2014-04-26 (×22): 0.25 mg via ORAL
  Filled 2014-04-10 (×24): qty 1

## 2014-04-10 MED ORDER — LORAZEPAM 2 MG/ML IJ SOLN
2.0000 mg | Freq: Once | INTRAMUSCULAR | Status: AC
  Filled 2014-04-10: qty 1

## 2014-04-10 MED ORDER — EPHEDRINE SULFATE 50 MG/ML IJ SOLN
INTRAMUSCULAR | Status: DC | PRN
  Administered 2014-04-10 (×2): 10 mg via INTRAVENOUS
  Administered 2014-04-10: 5 mg via INTRAVENOUS
  Administered 2014-04-10 (×3): 10 mg via INTRAVENOUS
  Administered 2014-04-10: 5 mg via INTRAVENOUS

## 2014-04-10 MED ORDER — INSULIN REGULAR HUMAN 100 UNIT/ML IJ SOLN
10.0000 [IU] | INTRAMUSCULAR | Status: AC
  Administered 2014-04-10: 10 [IU] via SUBCUTANEOUS
  Filled 2014-04-10: qty 0.1

## 2014-04-10 MED ORDER — LORAZEPAM 2 MG/ML IJ SOLN
INTRAMUSCULAR | Status: AC
  Administered 2014-04-10: 2 mg
  Filled 2014-04-10: qty 1

## 2014-04-10 MED ORDER — EPHEDRINE SULFATE 50 MG/ML IJ SOLN
INTRAMUSCULAR | Status: AC
  Filled 2014-04-10: qty 1

## 2014-04-10 MED ORDER — DIPHENHYDRAMINE HCL 50 MG/ML IJ SOLN: 25.0000 mg | Freq: Four times a day (QID) | INTRAMUSCULAR | Status: DC | PRN

## 2014-04-10 MED ORDER — FENTANYL CITRATE 0.05 MG/ML IJ SOLN
INTRAMUSCULAR | Status: DC | PRN
Start: 2014-04-10 — End: 2014-04-10
  Administered 2014-04-10 (×4): 50 ug via INTRAVENOUS
  Administered 2014-04-10: 25 ug via INTRAVENOUS

## 2014-04-10 MED ORDER — SODIUM CHLORIDE 0.9 % IV SOLN
INTRAVENOUS | Status: DC | PRN
  Administered 2014-04-10 (×2): via INTRAVENOUS

## 2014-04-10 MED ORDER — GUAIFENESIN-DM 100-10 MG/5ML PO SYRP: 15.0000 mL | ORAL_SOLUTION | ORAL | Status: DC | PRN

## 2014-04-10 MED ORDER — ALBUTEROL SULFATE HFA 108 (90 BASE) MCG/ACT IN AERS
INHALATION_SPRAY | RESPIRATORY_TRACT | Status: DC | PRN
  Administered 2014-04-10: 6 via RESPIRATORY_TRACT

## 2014-04-10 MED ORDER — DEXTROSE 50 % IV SOLN
INTRAVENOUS | Status: DC | PRN
  Administered 2014-04-10: .5 via INTRAVENOUS

## 2014-04-10 MED ORDER — DEXTROSE 5 % IV SOLN
2.0000 ug/min | INTRAVENOUS | Status: DC
  Administered 2014-04-10: 16 ug/min via INTRAVENOUS
  Administered 2014-04-11: 15 ug/min via INTRAVENOUS
  Administered 2014-04-11: 16 ug/min via INTRAVENOUS
  Administered 2014-04-11: 15 ug/min via INTRAVENOUS
  Administered 2014-04-11: 12 ug/min via INTRAVENOUS
  Administered 2014-04-12: 16 ug/min via INTRAVENOUS
  Administered 2014-04-13: 2 ug/min via INTRAVENOUS
  Administered 2014-04-15: 5 ug/min via INTRAVENOUS
  Administered 2014-04-17: 3 ug/min via INTRAVENOUS
  Administered 2014-04-18: 10 ug/min via INTRAVENOUS
  Administered 2014-04-18: 6 ug/min via INTRAVENOUS
  Administered 2014-04-19: 9 ug/min via INTRAVENOUS
  Administered 2014-04-19: 3 ug/min via INTRAVENOUS
  Administered 2014-04-20: 5 ug/min via INTRAVENOUS
  Filled 2014-04-10 (×22): qty 4

## 2014-04-10 MED ORDER — PHENYTOIN SODIUM EXTENDED 100 MG PO CAPS: 200.0000 mg | ORAL_CAPSULE | Freq: Every day | ORAL | Status: DC

## 2014-04-10 MED ORDER — SURGIFOAM 100 EX MISC
CUTANEOUS | Status: DC | PRN
  Administered 2014-04-10: 13:00:00 via TOPICAL

## 2014-04-10 MED ORDER — MEPERIDINE HCL 25 MG/ML IJ SOLN: 6.2500 mg | INTRAMUSCULAR | Status: DC | PRN

## 2014-04-10 MED ORDER — ESMOLOL HCL 10 MG/ML IV SOLN
INTRAVENOUS | Status: AC
  Filled 2014-04-10: qty 10

## 2014-04-10 SURGICAL SUPPLY — 46 items
ADH SKN CLS APL DERMABOND .7 (GAUZE/BANDAGES/DRESSINGS) ×2
BLADE SURG ROTATE 9660 (MISCELLANEOUS) ×1 IMPLANT
BOOT SUTURE AID YELLOW STND (SUTURE) ×1 IMPLANT
CANISTER SUCTION 2500CC (MISCELLANEOUS) ×3 IMPLANT
CATH EMB 4FR 80CM (CATHETERS) ×3 IMPLANT
CLIP TI MEDIUM 6 (CLIP) ×3 IMPLANT
CLIP TI WIDE RED SMALL 6 (CLIP) ×3 IMPLANT
COVER SURGICAL LIGHT HANDLE (MISCELLANEOUS) ×3 IMPLANT
DERMABOND ADVANCED (GAUZE/BANDAGES/DRESSINGS) ×1
DERMABOND ADVANCED .7 DNX12 (GAUZE/BANDAGES/DRESSINGS) ×2 IMPLANT
DRAPE INCISE IOBAN 85X60 (DRAPES) ×1 IMPLANT
ELECT REM PT RETURN 9FT ADLT (ELECTROSURGICAL) ×3
ELECTRODE REM PT RTRN 9FT ADLT (ELECTROSURGICAL) ×2 IMPLANT
GAUZE SPONGE 4X4 16PLY XRAY LF (GAUZE/BANDAGES/DRESSINGS) ×1 IMPLANT
GEL ULTRASOUND 20GR AQUASONIC (MISCELLANEOUS) IMPLANT
GLOVE BIO SURGEON STRL SZ 6.5 (GLOVE) ×3 IMPLANT
GLOVE BIO SURGEON STRL SZ7.5 (GLOVE) ×3 IMPLANT
GLOVE BIOGEL PI IND STRL 6.5 (GLOVE) IMPLANT
GLOVE BIOGEL PI IND STRL 7.0 (GLOVE) IMPLANT
GLOVE BIOGEL PI INDICATOR 6.5 (GLOVE) ×2
GLOVE BIOGEL PI INDICATOR 7.0 (GLOVE) ×1
GLOVE ECLIPSE 6.0 STRL STRAW (GLOVE) ×1 IMPLANT
GLOVE SURG SS PI 7.0 STRL IVOR (GLOVE) ×4 IMPLANT
GOWN STRL REUS W/ TWL LRG LVL3 (GOWN DISPOSABLE) ×6 IMPLANT
GOWN STRL REUS W/ TWL XL LVL3 (GOWN DISPOSABLE) IMPLANT
GOWN STRL REUS W/TWL LRG LVL3 (GOWN DISPOSABLE) ×18
GOWN STRL REUS W/TWL XL LVL3 (GOWN DISPOSABLE) ×6
GRAFT GORETEX STRT 4-7X45 (Vascular Products) ×1 IMPLANT
KIT BASIN OR (CUSTOM PROCEDURE TRAY) ×3 IMPLANT
KIT ROOM TURNOVER OR (KITS) ×3 IMPLANT
LOOP VESSEL MAXI BLUE (MISCELLANEOUS) ×2 IMPLANT
LOOP VESSEL MINI RED (MISCELLANEOUS) IMPLANT
NS IRRIG 1000ML POUR BTL (IV SOLUTION) ×3 IMPLANT
PACK CV ACCESS (CUSTOM PROCEDURE TRAY) ×3 IMPLANT
PAD ARMBOARD 7.5X6 YLW CONV (MISCELLANEOUS) ×6 IMPLANT
PATCH VASCULAR VASCU GUARD 1X6 (Vascular Products) ×1 IMPLANT
SPONGE GAUZE 4X4 12PLY STER LF (GAUZE/BANDAGES/DRESSINGS) ×1 IMPLANT
SPONGE SURGIFOAM ABS GEL 100 (HEMOSTASIS) ×1 IMPLANT
SUT PROLENE 6 0 CC (SUTURE) ×12 IMPLANT
SUT PROLENE 7 0 BV1 MDA (SUTURE) ×2 IMPLANT
SUT VIC AB 3-0 SH 27 (SUTURE) ×9
SUT VIC AB 3-0 SH 27X BRD (SUTURE) ×2 IMPLANT
SUT VICRYL 4-0 PS2 18IN ABS (SUTURE) ×3 IMPLANT
TOWEL OR 17X24 6PK STRL BLUE (TOWEL DISPOSABLE) ×3 IMPLANT
TOWEL OR 17X26 10 PK STRL BLUE (TOWEL DISPOSABLE) ×3 IMPLANT
WATER STERILE IRR 1000ML POUR (IV SOLUTION) ×3 IMPLANT

## 2014-04-10 NOTE — Progress Notes (Signed)
Call to Pt.'s sister, Margaretmary LombardJanie Mills, established consent for procedure.

## 2014-04-10 NOTE — Procedures (Signed)
Name:  [name] MRN:  [id number] DOB:  11-29-1950  PROCEDURE NOTE  Procedure:  Ultrasound-guided central venous catheter placement.  Indications:  Need for intravenous access and hemodynamic monitoring.  Consent:  Consent was implied due to the emergency nature of the procedure.  Anesthesia:  A total of 10 mL of 1% Lidocaine was used for local infiltration anesthesia.  Procedure summary:  Appropriate equipment was assembled.  The patient was identified as [name] and safety timeout was performed. The patient was placed in Trendelenburg position.  Sterile technique was used. The patient's right groin was prepped using chlorhexidine / alcohol scrub and the field was draped in usual sterile fashion with full body drape. The right femoral vein was identified by ultrasound, the patency was evaluated. After the adequate anesthesia was achieved, the vein was cannulated with the introducer needle under sonographic guidance without difficulty. A guide wire was advanced through the introducer needle, which was then withdrawn. A small skin incision was made at the point of wire entry, the dilator was inserted over the guide wire and appropriate dilation was obtained. The dilator was removed and triple-lumen HD catheter was advanced over the guide wire, which was then removed.  All ports were aspirated and flushed with normal saline without difficulty. The catheter was secured into place with sutures. Antibiotic patch was placed and sterile dressing was applied.  Complications:  No immediate complications were noted.  Hemodynamic parameters and oxygenation remained stable throughout the procedure.  Estimated blood loss:  Less then 5 mL.  Orlean BradfordK. Zubelevitskiy, M.D. Pulmonary and Critical Care Medicine [facility] Pager: [phone]  04/10/2014, 6:22 PM

## 2014-04-10 NOTE — Discharge Instructions (Signed)

## 2014-04-10 NOTE — H&P (Signed)
VASCULAR [facility] VEIN SPECIALISTS SHORT STAY H&P  CC:  [name]  HPI: Pt with [name] [name].  Last thrombectomy 2 months ago.  Pt mentally retarded history provided by family.  [name] placed left [name] 2014.  Past Medical History  Diagnosis Date  . Heart murmur, systolic 08/10/2009  . Syncope 05/07/2009  . 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 Esophageal varices 11/07/2008  . Gastric ulcer 10/09    antral, with h pylori positive  . Congestive heart failure 03/06/2008  . Cellulitis and abscess of leg, except foot 03/06/2008  . Secondary hyperparathyroidism 02/02/2008  . Mute 02/02/2008  . Hyperlipidemia 02/02/2008  . Anemia 02/02/2008  . ESRD (end stage renal disease)     TTS hemodialysis  . GERD (gastroesophageal reflux disease)   . Hypertension 02/02/2008    in history  . Mental retardation 02/02/2008  . Mute   . Seizures     "non in a while at home". none in past year .  Marland Kitchen. Complication of anesthesia     in the past BP has dropped     FH:  Non-Contributory  Social HX History  Substance Use Topics  . Smoking status: Never Smoker   . Smokeless tobacco: Never Used  . Alcohol Use: No    Allergies No Known Allergies  Medications Current Facility-Administered Medications  Medication Dose Route Frequency Provider Last Rate Last Dose  . 0.9 %  sodium chloride infusion   Intravenous Continuous Sherren Kernsharles E Nonnie Pickney, MD      . 0.9 % irrigation (POUR BTL)    PRN Sherren Kernsharles E Henryk Ursin, MD   1,000 mL at 04/10/14 0949  . heparin 6,000 Units in sodium chloride irrigation 0.9 % 500 mL irrigation    PRN Sherren Kernsharles E Vaudie Engebretsen, MD        PHYSICAL EXAM  Filed Vitals:   04/10/14 0705  BP: 100/54  Pulse: 85  Temp: 97.8 F (36.6 C)  Resp: 16    General:  WDWN in NAD HENT: WNL Eyes: Pupils equal Pulmonary: normal non-labored breathing , without Rales, rhonchi,  wheezing Cardiac: RRR, Vascular Exam/Pulses:  No flow left [name]  Extremities without ischemic  changes, no Gangrene , no cellulitis; no open wounds;   Impression: thrombosed left [name]  Plan: Thrombectomy revision today.  Risk benefit complication discussed with family bleeding, [name] thrombosis, infection among others.   Jennalyn Cawley E @TODAY @ 9:53 AM

## 2014-04-10 NOTE — Transfer of Care (Signed)
Immediate Anesthesia Transfer of Care Note  Patient: [name]  Procedure(s) Performed: Procedure(s): THROMBECTOMY AND REVISION OF ARTERIOVENTOUS (AV) GORETEX  GRAFT (Left) PATCH ANGIOPLASTY LEFT SFA (Left)  Patient Location: PACU  Anesthesia Type:General  Level of Consciousness: awake, alert  and patient cooperative  Airway & Oxygen Therapy: Patient Spontanous Breathing and Patient connected to face mask oxygen  Post-op Assessment: Report given to PACU RN, Post -op Vital signs reviewed and stable and Patient moving all extremities X 4  Post vital signs: HR 110s, BP 90s/40s, requiring Neosynephrine gtt 100-150 mcg/min, RR 20 bpm.  [facility]  Transitioning to Levophed gtt (from Neosynephrine).    Complications: No apparent anesthesia complications

## 2014-04-10 NOTE — Consult Note (Signed)
PULMONARY / CRITICAL CARE MEDICINE  Name: [name] MRN: [id number] DOB: 11/18/1950    ADMISSION DATE:  04/10/2014 CONSULTATION DATE:  04/10/2014  REFERRING MD :  Darrick Penna PRIMARY SERVICE:  Vascular  CHIEF COMPLAINT:  Hypotension  BRIEF PATIENT DESCRIPTION: 63 yo baseline non verbal with ESRD on HD admitted for revision of thrombosed R AV graft. The procedure was complicated by injury to left superficial femoral artery requiring patch and significant blood loss.  Brought to PACU hypotensive, requiring vasopressors.   SIGNIFICANT EVENTS / STUDIES:  6/22  Revision of thrombosed AV graft complicated by arterial injury requiring patch, blood loss and hypotension  LINES / TUBES:  CULTURES:  ANTIBIOTICS: Cefazolin 6/22 x 1  The patient is encephalopathic and unable to provide history, which was obtained for available medical records.  HISTORY OF PRESENT ILLNESS:  63 yo baseline nonverbal with ESRD on HD admitted for revision of thrombosed R AV graft. The procedure was complicated by injury to left superficial femoral artery requiring patch and significant blood loss.  Brought to PACU hypotensive, requiring vasopressors.   PAST MEDICAL HISTORY :  Past Medical History  Diagnosis Date  . Heart murmur, systolic 08/10/2009  . Syncope 05/07/2009  . 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 Esophageal varices 11/07/2008  . Gastric ulcer 10/09    antral, with h pylori positive  . Congestive heart failure 03/06/2008  . Cellulitis and abscess of leg, except foot 03/06/2008  . Secondary hyperparathyroidism 02/02/2008  . Mute 02/02/2008  . Hyperlipidemia 02/02/2008  . Anemia 02/02/2008  . ESRD (end stage renal disease)     TTS hemodialysis  . GERD (gastroesophageal reflux disease)   . Hypertension 02/02/2008    in history  . Mental retardation 02/02/2008  . Mute   . Seizures     "non in a while at home". none in past year .  Marland Kitchen Complication of anesthesia     in the past BP has  dropped    Past Surgical History  Procedure Laterality Date  . Left forearm graft      for HD  . Arteriovenous graft placement  11/22/10    Right thigh AVG  . Thrombectomy and revision of arterioventous (av) goretex  graft    . Thrombectomy and revision of arterioventous (av) goretex  graft  10/22/2012    Procedure: THROMBECTOMY AND REVISION OF ARTERIOVENTOUS (AV) GORETEX  GRAFT;  Surgeon: Larina Earthly, MD;  Location: [facility] OR;  Service: Vascular;  Laterality: Right;  . Thrombectomy w/ embolectomy  11/10/2012    Procedure: THROMBECTOMY ARTERIOVENOUS GORE-TEX GRAFT;  Surgeon: Pryor Ochoa, MD;  Location: [facility] OR;  Service: Vascular;  Laterality: Right;  . Thrombectomy and revision of arterioventous (av) goretex  graft Right 12/08/2012    Procedure: THROMBECTOMY AND REVISION OF ARTERIOVENTOUS (AV) GORETEX  GRAFT right thigh;  Surgeon: Sherren Kerns, MD;  Location: [facility] OR;  Service: Vascular;  Laterality: Right;  Susie Cassette N/A 12/08/2012    Procedure: VENOGRAM;  Surgeon: Sherren Kerns, MD;  Location: [facility] OR;  Service: Vascular;  Laterality: N/A;  Intraoperative Central venogram  . Thrombectomy w/ embolectomy Right 12/12/2012    Procedure: THROMBECTOMY ARTERIOVENOUS GORE-TEX GRAFT;  Surgeon: Nada Libman, MD;  Location: [facility] OR;  Service: Vascular;  Laterality: Right;  . Insertion of dialysis catheter Left 12/14/2012    Procedure: INSERTION OF DIALYSIS CATHETER;  Surgeon: Nada Libman, MD;  Location: [facility] OR;  Service: Vascular;  Laterality: Left;  . Insertion of dialysis  catheter Right 01/13/2013    Procedure: INSERTION OF DIALYSIS CATHETER;  Surgeon: Nada LibmanVance W Brabham, MD;  [facility] OR;  Service: Vascular;  Laterality: Right;  . Removal of a dialysis catheter Left 01/13/2013    Procedure: REMOVAL OF A DIALYSIS CATHETER;  Surgeon: Nada LibmanVance W Brabham, MD;  Location: [facility] OR;  Service: Vascular;  Laterality: Left;  . Av fistula placement Left 02/11/2013    Procedure: INSERTION OF ARTERIOVENOUS (AV)  GORE-TEX GRAFT THIGH;  Surgeon: Nada LibmanVance W Brabham, MD;  Location: [facility] OR;  Service: Vascular;  Laterality: Left;  using 6mm x 50cm Gore-Tex Vascular Graft  . Esophagogastroduodenoscopy N/A 02/16/2013    Procedure: ESOPHAGOGASTRODUODENOSCOPY (EGD);  Surgeon: Vertell NovakJames L Edwards Jr., MD;  [facility];  Service: Endoscopy;  Laterality: N/A;  bedside  . Esophagogastroduodenoscopy N/A 09/14/2013    Procedure: ESOPHAGOGASTRODUODENOSCOPY (EGD);  Surgeon: Vertell NovakJames L Edwards Jr., MD;  [facility];  Service: Endoscopy;  Laterality: N/A;  control of bleeding if needed  . Esophagogastroduodenoscopy N/A 10/03/2013    Procedure: ESOPHAGOGASTRODUODENOSCOPY (EGD);  Surgeon: Theda BelfastPatrick D Hung, MD;  [facility];  Service: Endoscopy;  Laterality: N/A;  Bedside  . Radiology with anesthesia Right 10/19/2013    Procedure: RADIOLOGY WITH ANESTHESIA;  Surgeon: Malachy MoanHeath McCullough, MD;  [facility];  Service: Radiology;  Laterality: Right;  . Radiology with anesthesia Left 12/28/2013    Procedure: RADIOLOGY WITH ANESTHESIA;  Surgeon: Reola CalkinsGlenn T Yamagata, MD;  [facility];  Service: Radiology;  Laterality: Left;  . Thrombectomy and revision of arterioventous (av) goretex  graft Left 01/25/2014    Procedure: THROMBECTOMY AND REVISION OF LEFT THIGH ARTERIOVENTOUS (AV) GORETEX  GRAFT WITH PATCH ANGIOPLASTY;  Surgeon: Pryor OchoaJames D Lawson, MD;  Location: [facility] OR;  Service: Vascular;  Laterality: Left;   Prior to Admission medications   Medication Sig Start Date End Date Taking? Authorizing Provider  atorvastatin (LIPITOR) 20 MG tablet Take 20 mg by mouth at bedtime.    Yes Historical Provider, MD  cinacalcet (SENSIPAR) 30 MG tablet Take 30 mg by mouth 2 (two) times daily with a meal.   Yes Historical Provider, MD  clonazePAM (KLONOPIN) 0.5 MG tablet Take 0.25 mg by mouth 2 (two) times daily.   Yes Historical Provider, MD  folic acid-vitamin b complex-vitamin c-selenium-zinc (DIALYVITE) 3 MG TABS tablet Take 1 tablet  by mouth daily.   Yes Historical Provider, MD  lidocaine (LMX) 4 % cream Apply 1 application topically 3 (three) times a week. On Tuesday, Thursday, and Saturday   Yes Historical Provider, MD  midodrine (PROAMATINE) 10 MG tablet Take 15 mg by mouth 3 (three) times daily.    Yes Historical Provider, MD  pantoprazole (PROTONIX) 40 MG tablet Take 40 mg by mouth daily.   Yes Historical Provider, MD  phenytoin (DILANTIN) 100 MG ER capsule Take 200 mg by mouth at bedtime.   Yes Historical Provider, MD  sevelamer carbonate (RENVELA) 800 MG tablet Take 800 mg by mouth 3 (three) times daily with meals.   Yes Historical Provider, MD   No Known Allergies  FAMILY HISTORY:  History reviewed. No pertinent family history.  SOCIAL HISTORY:  reports that he has never smoked. He has never used smokeless tobacco. He reports that he does not drink alcohol or use illicit drugs.  REVIEW OF SYSTEMS:  Unable to provide.  INTERVAL HISTORY:  VITAL SIGNS: Temp:  [96.4 F (35.8 C)-97.8 F (36.6 C)] 96.4 F (35.8 C) (06/22 1550) Pulse Rate:  [85-113] 109 (06/22 1615) Resp:  [  14-34] 17 (06/22 1630) BP: (64-128)/(32-73) 97/64 mmHg (06/22 1630) SpO2:  [98 %-100 %] 100 % (06/22 1615) Weight:  [53.524 kg (118 lb)] 53.524 kg (118 lb) (06/22 0705)  HEMODYNAMICS:   VENTILATOR SETTINGS:   INTAKE / OUTPUT: Intake/Output     06/21 0701 - 06/22 0700 06/22 0701 - 06/23 0700   I.V. (mL/kg)  900 (16.8)   IV Piggyback  500   Total Intake(mL/kg)  1400 (26.2)   Blood  1000   Total Output   1000   Net   +400          PHYSICAL EXAMINATION: General:  No distress Neuro:  Encephalopathic, but opens eyes to stimulation HEENT:  Moist oral membranes, oxygen mask is on, L EJ IV noted Cardiovascular:  Regular, no murmurs Lungs:  Bilateral diminished air entry Abdomen:  Soft, nontender, bowel sounds diminished Musculoskeletal:  Moves all extremities, no edema Skin:  Surgical site L groin intact, old scars / graft L  groin  LABS: CBC  Recent Labs Lab 04/10/14 1011 04/10/14 1440 04/10/14 1602  WBC  --   --  9.0  HGB 11.9* 9.9* 6.8*  HCT 35.0* 29.0* 21.4*  PLT  --   --  177   Coag's No results found for this basename: APTT, INR,  in the last 168 hours  BMET  Recent Labs Lab 04/10/14 1011 04/10/14 1440  NA 142 141  K 6.1* 6.5*  GLUCOSE 90 173*   Electrolytes No results found for this basename: CALCIUM, MG, PHOS,  in the last 168 hours Sepsis Markers No results found for this basename: LATICACIDVEN, PROCALCITON, O2SATVEN,  in the last 168 hours ABG No results found for this basename: PHART, PCO2ART, PO2ART,  in the last 168 hours Liver Enzymes No results found for this basename: AST, ALT, ALKPHOS, BILITOT, ALBUMIN,  in the last 168 hours Cardiac Enzymes No results found for this basename: TROPONINI, PROBNP,  in the last 168 hours Glucose No results found for this basename: GLUCAP,  in the last 168 hours  IMAGES: Dg Chest Port 1 View  04/10/2014   CLINICAL DATA:  Renal failure, postoperative evaluation, fluid overload  EXAM: PORTABLE CHEST - 1 VIEW  COMPARISON:  None.  FINDINGS: Low lung volumes. Normal heart size and vascularity. Minor left base atelectasis is evident. Negative for edema or CHF. No pneumothorax. Trachea is midline. Atherosclerosis of the aorta. Gastric distention noted in the left upper quadrant.  IMPRESSION: Low volume exam with left basilar atelectasis.   Electronically Signed   By: Ruel Favorsrevor  Shick M.D.   On: 04/10/2014 17:05   ASSESSMENT / PLAN:  PULMONARY A:   No active issues P:   Goal SpO2>92 Supplemental oxygen Incentive spirometry / flutter valve / pulmonary hygiene  CARDIOVASCULAR A:  Chronic hypotension Shock ( hemorrhagic / hypovolemic / vasodilatory from general anesthesia; doubt septic ) Occluded L groin AV graft s/p attempted repair, femoral artery injury and patch Occluded R groin AV graft Chronically occluded SVC Difficult IV access P:   Goal MAP>65 Trend troponin / lactate Levophed gtt Midodrine when able  RENAL A:   ESRD on HD Hyperkalemia P:   Renal consulted ( Dr. Hyman HopesWebb, by Dr. Darrick PennaFields, 6/22 ) Trend BMP Place temporary HD cath CVVH Treated with Insulin Give Ca 1   GASTROINTESTINAL A:   GERD on PPI P:   NPO Protonix, convert to IV  HEMATOLOGIC A:  Acute blood loss anemia VTE Px P:  Trend CBC Transfuse additional 2 units PRBC  INFECTIOUS A:   Doubt acute infection P:   Prophylactic abx per vascular as above  ENDOCRINE  A:  Adrenal insufficiency ( cortisol 9 09/15/2013 ) At risk for hypo / hyperglycemia P:   Hydrocortisone 50 mg q6h SSI  NEUROLOGIC A:   Baseline nonverbal History of seizures, none recent Post op pain P:   Fentanyl PRN Dilantin, convert to IV  I have personally obtained history, examined patient, evaluated and interpreted laboratory and imaging results, reviewed medical records, formulated assessment / plan and placed orders.  CRITICAL CARE:  The patient is critically ill with multiple organ systems failure and requires high complexity decision making for assessment and support, frequent evaluation and titration of therapies, application of advanced monitoring technologies and extensive interpretation of multiple databases. Critical Care Time devoted to patient care services described in this note is 45 minutes.   Lonia Farber, MD Pulmonary and Critical Care Medicine [facility] Pager: [phone]  04/10/2014, 4:58 PM

## 2014-04-10 NOTE — Progress Notes (Signed)
Dr Noreene LarssonJoslin aware hgb 6.8 and hct 21.4

## 2014-04-10 NOTE — Op Note (Signed)
Procedure: #1 patch angioplasty left superficial femoral artery #2 thrombectomy left thigh graft #3 patch angioplasty left venous anastomosis #4 new interposition graft arterial limb 4 mm PTFE  Preoperative diagnosis: Thrombosed left thigh AV graft  Postoperative diagnosis: Same  Anesthesia: Gen.  Assistant: Karsten RoKim Trinh PA-C, Doreatha MassedSamantha Rhyne PA-C  Operative findings: Dense scar tissue left groin. Iatrogenic injury to the anterior wall of left superficial femoral artery requiring bovine pericardial patch  Operative details: After obtaining informed consent from the patient's guardian, the patient is. The patient was placed in supine position on the operating table. After induction anesthesia patient's entire left groin and anterior thigh were prepped and draped in usual sterile fashion. Longitudinal incision was made her pre-existing scar in the left groin. Incision was carried into the saphenous tissues down to level of the venous limb of the graft. There was dense scar tissue surrounding this. Dissection was carried down to level the venous anastomosis. During the course of dissecting out the vein on the medial side fissure femoral artery was inadvertently injured on its anterior wall. I tried to place several repair sutures in this but the artery would not hold these well and he hole was becoming larger. Digital pressure was used to control bleeding while I dissected out the common femoral artery at the inguinal ligament. There was also dense scar tissue surrounding the entire artery. I was able to identify the common femoral artery and place a vessel loop around this. I then identified the profunda femoris artery which was fairly small a vessel loop was also placed around this. After tedious dissection the superficial femoral artery was dissected free circumferentially below the area of injury. Superficial femoral artery was densely encased in scar tissue. After this had all been completely freed up, the  patient was given 5000 units of intravenous heparin. After 2 minutes of circulation time, the artery was controlled proximally and distally with vessel loops on the common femoral superficial femoral and profunda femoris arteries. There was a long longitudinal tear on the anterior wall of superficial femoral artery. I had to debride the edges of this. The artery was opened all the way down to the origin of the previous arterial anastomosis and I still could not get a reasonable area to sew the artery. Therefore the old arterial anastomosis was taken down completely. A bovine pericardial patch was then brought up in the operative field and sewn on as a patch angioplasty from the origin of the SFA to approximately 3 cm distally. Just prior completion anastomosis was forebled backbled and thoroughly flushed the anastomosis was secured clamps released there was pulsatile flow the superficial femoral artery immediately. He was good Doppler flow in the dorsalis pedis and posterior tibial areas of the foot. This was packed with thrombin Gelfoam. Attention was then turned back to the venous anastomosis. I continued to dissect out the common femoral vein above and below the area anastomosis. A vessel loop was placed around the proximal and distal portions of the vein. A longitudinal opening was then made in the hood of the venous limb of the graft. This carried down through a recently placed Dacron patch. There was dense scar tissue and intimal hyperplasia in this area. All this was completely taken down. The vein was controlled proximally and distally with him the clamps. After I had dissected the vein longitudinally passed level of stenosis, a PTFE patch was constructed by opening a 47 mm graft longitudinally and the 7 mm end. Assistance of onset patch angioplasty  using a running 7-0 Prolene suture. There was good backbleeding from the vein. Just prior completion anastomosis was forebled backbled and thoroughly flushed.  The patch extended approximately 3 cm onto the anterior wall of the common femoral vein and then onto the anterior portion of the old venous limb. #4 for a catheter was then used to thrombectomize the graft from the arterial limb to the venous limb. All thrombotic material was removed and arterialized plug was retrieved. There was excellent venous back bleeding. The graft was controlled with a fistula clamp. The 4 mm end of the 47 mm PTFE graft was then cut to length. The superficial femoral artery was controlled proximally and distally with Vesseloops. A longitudinal opening was made in the anterior wall of the bovine patch. The 4 mm end of the graft was then sewn end of graft to side of patch using running 6-0 Prolene suture. Just prior to completion of the anastomosis it was forebled backbled and thoroughly flushed. A new 4 mm graft was then clamped just above the anastomosis. It was cut to length and then sewn after beveling it to the old portion of the arterial limb the graft. This was done end-to-end using a running 6-0 Prolene suture. Just prior completion of the anastomosis it was forebled backbled and thoroughly flushed. The anastomosis was secured clamps and there was good Doppler flow in the graft in the. There was not a palpable thrill but the patient had become hypotensive at this point with a blood pressure in the low 80s. Patient was given 50 mg of protamine as his pressure improved. The Doppler flow in the graft was also slightly improved. I did not feel that any other interventions could be done to make the graft flow better at this point. Reasonable hemostasis was obtained. The deep layers of the groin were closed in multiple layers or running 3-0 Vicryl suture. The skin was then closed with 4 Vicryl subcuticular stitch. Dermabond was applied the skin. The patient's blood pressure had improved by the conclusion of the case although he was still slightly hypotensive with a systolic blood pressure  sometimes below 90 on Neo-Synephrine. The patient was extubated and taken to recovery room in otherwise stable condition. Instrument sponge and needle count was correct at the end of the case. If this graft occludes in the near future it should not be revised.  Fabienne Brunsharles Fields, MD [facility] [place] Office: [phone] Pager: [phone]

## 2014-04-10 NOTE — Progress Notes (Signed)
Pt with no audible or palpable flow left thigh graft.  Not a candidate for further revision.  Currently requiring pressor support for BP.  Discussed with Dr Hyman HopesWebb.  Will need 2S bed and CVVH since on pressors.  Discussed with Dr Danise MinaPat Wright from [facility] who will also see pt.  Fabienne Brunsharles Fields, MD [facility] [place] Office: [phone] Pager: [phone]

## 2014-04-10 NOTE — Progress Notes (Signed)
Call to Dr. Darrick PennaFields for operative consent order.

## 2014-04-10 NOTE — Progress Notes (Signed)
Dr. Jean RosenthalJackson, has been into see pt., IV & bld. Draw will be done in OR

## 2014-04-10 NOTE — Progress Notes (Signed)
Levophed started

## 2014-04-10 NOTE — Anesthesia Preprocedure Evaluation (Addendum)
Anesthesia Evaluation  Patient identified by MRN, date of birth, ID band Patient confused    Reviewed: Allergy & Precautions, H&P , NPO status , Patient's Chart, lab work & pertinent test results, Unable to perform ROS - Chart review only  History of Anesthesia Complications (+) history of anesthetic complications  Airway       Dental   Pulmonary neg pulmonary ROS,  breath sounds clear to auscultation        Cardiovascular hypertension, Pt. on medications + Peripheral Vascular Disease (h/o SVC syndrome) Rhythm:Regular Rate:Normal  '14 ECHO: EF 60-65%, valves OK   Neuro/Psych Seizures -, Well Controlled,  PSYCHIATRIC DISORDERS Anxiety Mute, mental deficiency    GI/Hepatic Neg liver ROS, GERD-  Medicated and Controlled,  Endo/Other  negative endocrine ROS  Renal/GU ESRF and DialysisRenal disease     Musculoskeletal   Abdominal   Peds  (+) mental retardation Hematology   Anesthesia Other Findings   Reproductive/Obstetrics                         Anesthesia Physical Anesthesia Plan  ASA: III  Anesthesia Plan: General   Post-op Pain Management:    Induction: Inhalational  Airway Management Planned: LMA  Additional Equipment:   Intra-op Plan:   Post-operative Plan:   Informed Consent: I have reviewed the patients History and Physical, chart, labs and discussed the procedure including the risks, benefits and alternatives for the proposed anesthesia with the patient or authorized representative who has indicated his/her understanding and acceptance.   Consent reviewed with POA and History available from chart only  Plan Discussed with: CRNA and Surgeon  Anesthesia Plan Comments: (Plan routine monitors, GA with inhalational induction Check i-Stat after pt asleep)        Anesthesia Quick Evaluation

## 2014-04-10 NOTE — Progress Notes (Signed)
Dr Noreene LarssonJoslin at bedside.  Aware of BP.  Levophed ordered, labs to be sent.  pcxr ordered.

## 2014-04-10 NOTE — Plan of Care (Signed)
Problem: Phase I Progression Outcomes Goal: Voiding-avoid urinary catheter unless indicated Outcome: Not Applicable Date Met:  [birth year] anuric

## 2014-04-10 NOTE — Consult Note (Signed)
Referring Provider: No ref. provider found Primary Care Physician:  Janalyn Harder, MD Primary Nephrologist:  Dr. Arrie Aran  Reason for Consultation:  Medical management of complications of ESRD and acute shock  HPI: 63 yo baseline non verbal with ESRD on HD admitted for revision of thrombosed R AV graft. The procedure was complicated by injury to left superficial femoral artery requiring patch and significant blood loss. Brought to PACU hypotensive, requiring vasopressors.Patient is unfortunately running out of access plans. Now has a temporary dialysis catheter    Past Medical History  Diagnosis Date  . Heart murmur, systolic 08/10/2009  . Syncope 05/07/2009  . 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 Esophageal varices 11/07/2008  . Gastric ulcer 10/09    antral, with h pylori positive  . Congestive heart failure 03/06/2008  . Cellulitis and abscess of leg, except foot 03/06/2008  . Secondary hyperparathyroidism 02/02/2008  . Mute 02/02/2008  . Hyperlipidemia 02/02/2008  . Anemia 02/02/2008  . ESRD (end stage renal disease)     TTS hemodialysis  . GERD (gastroesophageal reflux disease)   . Hypertension 02/02/2008    in history  . Mental retardation 02/02/2008  . Mute   . Seizures     "non in a while at home". none in past year .  Marland Kitchen Complication of anesthesia     in the past BP has dropped     Past Surgical History  Procedure Laterality Date  . Left forearm graft      for HD  . Arteriovenous graft placement  11/22/10    Right thigh AVG  . Thrombectomy and revision of arterioventous (av) goretex  graft    . Thrombectomy and revision of arterioventous (av) goretex  graft  10/22/2012    Procedure: THROMBECTOMY AND REVISION OF ARTERIOVENTOUS (AV) GORETEX  GRAFT;  Surgeon: Larina Earthly, MD;  Location: [facility] OR;  Service: Vascular;  Laterality: Right;  . Thrombectomy w/ embolectomy  11/10/2012    Procedure: THROMBECTOMY ARTERIOVENOUS GORE-TEX GRAFT;  Surgeon: Pryor Ochoa, MD;   Location: [facility] OR;  Service: Vascular;  Laterality: Right;  . Thrombectomy and revision of arterioventous (av) goretex  graft Right 12/08/2012    Procedure: THROMBECTOMY AND REVISION OF ARTERIOVENTOUS (AV) GORETEX  GRAFT right thigh;  Surgeon: Sherren Kerns, MD;  Location: [facility] OR;  Service: Vascular;  Laterality: Right;  Susie Cassette N/A 12/08/2012    Procedure: VENOGRAM;  Surgeon: Sherren Kerns, MD;  Location: [facility] OR;  Service: Vascular;  Laterality: N/A;  Intraoperative Central venogram  . Thrombectomy w/ embolectomy Right 12/12/2012    Procedure: THROMBECTOMY ARTERIOVENOUS GORE-TEX GRAFT;  Surgeon: Nada Libman, MD;  Location: [facility] OR;  Service: Vascular;  Laterality: Right;  . Insertion of dialysis catheter Left 12/14/2012    Procedure: INSERTION OF DIALYSIS CATHETER;  Surgeon: Nada Libman, MD;  Location: [facility] OR;  Service: Vascular;  Laterality: Left;  . Insertion of dialysis catheter Right 01/13/2013    Procedure: INSERTION OF DIALYSIS CATHETER;  Surgeon: Nada Libman, MD;  Location: [facility] OR;  Service: Vascular;  Laterality: Right;  . Removal of a dialysis catheter Left 01/13/2013    Procedure: REMOVAL OF A DIALYSIS CATHETER;  Surgeon: Nada Libman, MD;  Location: [facility] OR;  Service: Vascular;  Laterality: Left;  . Av fistula placement Left 02/11/2013    Procedure: INSERTION OF ARTERIOVENOUS (AV) GORE-TEX GRAFT THIGH;  Surgeon: Nada Libman, MD;  Location: [facility] OR;  Service: Vascular;  Laterality: Left;  using 6mm x 50cm  Gore-Tex Vascular Graft  . Esophagogastroduodenoscopy N/A 02/16/2013    Procedure: ESOPHAGOGASTRODUODENOSCOPY (EGD);  Surgeon: Vertell NovakJames L Edwards Jr., MD;  [facility];  Service: Endoscopy;  Laterality: N/A;  bedside  . Esophagogastroduodenoscopy N/A 09/14/2013    Procedure: ESOPHAGOGASTRODUODENOSCOPY (EGD);  Surgeon: Vertell NovakJames L Edwards Jr., MD;  [facility];  Service: Endoscopy;  Laterality: N/A;  control of bleeding if needed  . Esophagogastroduodenoscopy N/A  10/03/2013    Procedure: ESOPHAGOGASTRODUODENOSCOPY (EGD);  Surgeon: Theda BelfastPatrick D Hung, MD;  [facility];  Service: Endoscopy;  Laterality: N/A;  Bedside  . Radiology with anesthesia Right 10/19/2013    Procedure: RADIOLOGY WITH ANESTHESIA;  Surgeon: Malachy MoanHeath McCullough, MD;  [facility];  Service: Radiology;  Laterality: Right;  . Radiology with anesthesia Left 12/28/2013    Procedure: RADIOLOGY WITH ANESTHESIA;  Surgeon: Reola CalkinsGlenn T Yamagata, MD;  [facility];  Service: Radiology;  Laterality: Left;  . Thrombectomy and revision of arterioventous (av) goretex  graft Left 01/25/2014    Procedure: THROMBECTOMY AND REVISION OF LEFT THIGH ARTERIOVENTOUS (AV) GORETEX  GRAFT WITH PATCH ANGIOPLASTY;  Surgeon: Pryor OchoaJames D Lawson, MD;  Location: [facility] OR;  Service: Vascular;  Laterality: Left;    Prior to Admission medications   Medication Sig Start Date End Date Taking? Authorizing Provider  atorvastatin (LIPITOR) 20 MG tablet Take 20 mg by mouth at bedtime.    Yes Historical Provider, MD  cinacalcet (SENSIPAR) 30 MG tablet Take 30 mg by mouth 2 (two) times daily with a meal.   Yes Historical Provider, MD  clonazePAM (KLONOPIN) 0.5 MG tablet Take 0.25 mg by mouth 2 (two) times daily.   Yes Historical Provider, MD  folic acid-vitamin b complex-vitamin c-selenium-zinc (DIALYVITE) 3 MG TABS tablet Take 1 tablet by mouth daily.   Yes Historical Provider, MD  lidocaine (LMX) 4 % cream Apply 1 application topically 3 (three) times a week. On Tuesday, Thursday, and Saturday   Yes Historical Provider, MD  midodrine (PROAMATINE) 10 MG tablet Take 15 mg by mouth 3 (three) times daily.    Yes Historical Provider, MD  pantoprazole (PROTONIX) 40 MG tablet Take 40 mg by mouth daily.   Yes Historical Provider, MD  phenytoin (DILANTIN) 100 MG ER capsule Take 200 mg by mouth at bedtime.   Yes Historical Provider, MD  sevelamer carbonate (RENVELA) 800 MG tablet Take 800 mg by mouth 3 (three) times daily with meals.   Yes  Historical Provider, MD    Current Facility-Administered Medications  Medication Dose Route Frequency Provider Last Rate Last Dose  . 0.9 %  sodium chloride infusion   Intravenous Continuous Sherren Kernsharles E Fields, MD      . acetaminophen (TYLENOL) tablet 325-650 mg  325-650 mg Oral Q4H PRN Ames CoupeSamantha J Rhyne, PA-C       Or  . acetaminophen (TYLENOL) suppository 325-650 mg  325-650 mg Rectal Q4H PRN Samantha J Rhyne, PA-C      . atorvastatin (LIPITOR) tablet 20 mg  20 mg Oral QHS Samantha J Rhyne, PA-C      . cinacalcet (SENSIPAR) tablet 30 mg  30 mg Oral BID WC Samantha J Rhyne, PA-C      . clonazePAM (KLONOPIN) tablet 0.25 mg  0.25 mg Oral BID Samantha J Rhyne, PA-C      . diphenhydrAMINE (BENADRYL) injection 25 mg  25 mg Intravenous Q6H PRN Konstantin Zubelevitskiy, MD      . fentaNYL (SUBLIMAZE) injection 50-100 mcg  50-100 mcg Intravenous Q2H PRN Lonia FarberKonstantin Zubelevitskiy, MD      .  heparin injection 1,000-6,000 Units  1,000-6,000 Units CRRT PRN Garnetta BuddyMartin W Webb, MD      . hydrocortisone sodium succinate (SOLU-CORTEF) 100 mg/2 mL injection 50 mg  50 mg Intravenous Q6H Lonia FarberKonstantin Zubelevitskiy, MD   50 mg at 04/10/14 1843  . insulin aspart (novoLOG) injection 0-15 Units  0-15 Units Subcutaneous 6 times per day Lonia FarberKonstantin Zubelevitskiy, MD      . midodrine (PROAMATINE) tablet 15 mg  15 mg Oral TID Ames CoupeSamantha J Rhyne, PA-C      . multivitamin (RENA-VIT) tablet 1 tablet  1 tablet Oral QHS Anabel BeneSendra Yang, RPH      . norepinephrine (LEVOPHED) 4 mg in dextrose 5 % 250 mL infusion  2-50 mcg/min Intravenous Titrated Kipp Broodavid Joslin, MD 48.8 mL/hr at 04/10/14 1846 13 mcg/min at 04/10/14 1846  . ondansetron (ZOFRAN) injection 4 mg  4 mg Intravenous Q6H PRN Samantha J Rhyne, PA-C      . pantoprazole (PROTONIX) injection 40 mg  40 mg Intravenous Q24H Lonia FarberKonstantin Zubelevitskiy, MD   40 mg at 04/10/14 1840  . phenol (CHLORASEPTIC) mouth spray 1 spray  1 spray Mouth/Throat PRN Samantha J Rhyne, PA-C      . phenytoin  (DILANTIN) 200 mg in sodium chloride 0.9 % 100 mL IVPB  200 mg Intravenous QHS Lonia FarberKonstantin Zubelevitskiy, MD      . prismasol BGK 4/2.5 5,000 mL dialysis replacement fluid   CRRT Continuous Garnetta BuddyMartin W Webb, MD      . prismasol BGK 4/2.5 5,000 mL dialysis replacement fluid   CRRT Continuous Garnetta BuddyMartin W Webb, MD      . prismasol BGK 4/2.5 5,000 mL dialysis solution   CRRT Continuous Garnetta BuddyMartin W Webb, MD      . senna [facility](SENOKOT) tablet 8.6 mg  1 tablet Oral BID Samantha J Rhyne, PA-C      . sevelamer carbonate (RENVELA) tablet 800 mg  800 mg Oral TID WC Samantha J Rhyne, PA-C      . sodium chloride 0.9 % primer fluid for CRRT   CRRT PRN Garnetta BuddyMartin W Webb, MD        Allergies as of 04/08/2014  . (No Known Allergies)    History reviewed. No pertinent family history.  History   Social History  . Marital Status: Single    Spouse Name: N/A    Number of Children: 0  . Years of Education: N/A   Occupational History  .     Social History Main Topics  . Smoking status: Never Smoker   . Smokeless tobacco: Never Used  . Alcohol Use: No  . Drug Use: No  . Sexual Activity: No   Other Topics Concern  . Not on file   Social History Narrative   Patient is living with care providers.    Patient is right handed.    Patient does not have any children.    Patient is on disability           Review of Systems:  Unresponsive not intubated but requiring pressors  Physical Exam: Vital signs in last 24 hours: Temp:  [96.1 F (35.6 C)-97.8 F (36.6 C)] 96.1 F (35.6 C) (06/22 2000) Pulse Rate:  [85-133] 127 (06/22 1930) Resp:  [10-34] 21 (06/22 1930) BP: (60-128)/(27-73) 73/47 mmHg (06/22 1930) SpO2:  [98 %-100 %] 100 % (06/22 1930) Weight:  [53.524 kg (118 lb)-57.1 kg (125 lb 14.1 oz)] 57.1 kg (125 lb 14.1 oz) (06/22 1800)   General:  Ill appearing Head:  Microcephalic Eyes:  Sclera clear,  no icterus.   Conjunctiva pink. Ears:  Normal auditory acuity. Nose:  No deformity, discharge,  or  lesions. Mouth:  No deformity or lesions, dentition normal. Neck:  Supple; no masses or thyromegaly. JVP not elevated Lungs:  Clear throughout to auscultation.   No wheezes, crackles, or rhonchi. No acute distress. Heart:  Regular rate and rhythm; no murmurs, clicks, rubs,  or gallops. Abdomen:  Soft, nontender and nondistended. No masses, hepatosplenomegaly or hernias noted. Normal bowel sounds, without guarding, and without rebound.   Msk:  Symmetrical without gross deformities. Normal posture. Pulses:  No carotid, renal, femoral bruits. DP and PT symmetrical and equal Extremities:  Without clubbing or edema.   Intake/Output from previous day:   Intake/Output this shift:    Lab Results:  Recent Labs  04/10/14 1011 04/10/14 1440 04/10/14 1602  WBC  --   --  9.0  HGB 11.9* 9.9* 6.8*  HCT 35.0* 29.0* 21.4*  PLT  --   --  177   BMET  Recent Labs  04/10/14 1011 04/10/14 1440 04/10/14 1602  NA 142 141 149*  K 6.1* 6.5* 5.3  CL  --   --  107  CO2  --   --  18*  GLUCOSE 90 173* 177*  BUN  --   --  60*  CREATININE  --   --  13.24*  CALCIUM  --   --  8.7   LFT  Recent Labs  04/10/14 1602  PROT 5.3*  ALBUMIN 3.2*  AST 15  ALT 11  ALKPHOS 97  BILITOT 0.3   PT/INR No results found for this basename: LABPROT, INR,  in the last 72 hours Hepatitis Panel No results found for this basename: HEPBSAG, HCVAB, HEPAIGM, HEPBIGM,  in the last 72 hours  Studies/Results: Dg Chest Port 1 View  04/10/2014   CLINICAL DATA:  Renal failure, postoperative evaluation, fluid overload  EXAM: PORTABLE CHEST - 1 VIEW  COMPARISON:  None.  FINDINGS: Low lung volumes. Normal heart size and vascularity. Minor left base atelectasis is evident. Negative for edema or CHF. No pneumothorax. Trachea is midline. Atherosclerosis of the aorta. Gastric distention noted in the left upper quadrant.  IMPRESSION: Low volume exam with left basilar atelectasis.   Electronically Signed   By: Ruel Favors  M.D.   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

## 2014-04-10 NOTE — OR Nursing (Signed)
Caregiver Chauncey Mannenee Canty with [name] in [place] and answered all questions.

## 2014-04-10 NOTE — Anesthesia Postprocedure Evaluation (Signed)
  Anesthesia Post-op Note  Patient: [name]  Procedure(s) Performed: Procedure(s): THROMBECTOMY AND REVISION OF ARTERIOVENTOUS (AV) GORETEX  GRAFT (Left) PATCH ANGIOPLASTY LEFT SFA (Left)  Patient Location: PACU  Anesthesia Type:General  Level of Consciousness: awake and alert   Airway and Oxygen Therapy: Patient Spontanous Breathing and Patient connected to nasal cannula oxygen  Post-op Pain: mild  Post-op Assessment: Post-op Vital signs reviewed, Respiratory Function Stable, Patent Airway and Pain level controlled  Post-op Vital Signs: Reviewed  Last Vitals:  Filed Vitals:   04/10/14 1800  BP: 60/27  Pulse:   Temp:   Resp: 14    Complications: No apparent anesthesia complications

## 2014-04-11 ENCOUNTER — Inpatient Hospital Stay (HOSPITAL_COMMUNITY): Payer: Medicare Other | Admitting: Certified Registered Nurse Anesthetist

## 2014-04-11 ENCOUNTER — Other Ambulatory Visit: Payer: Medicare Other

## 2014-04-11 ENCOUNTER — Other Ambulatory Visit: Payer: Self-pay

## 2014-04-11 ENCOUNTER — Inpatient Hospital Stay (HOSPITAL_COMMUNITY): Payer: Medicare Other

## 2014-04-11 ENCOUNTER — Encounter (HOSPITAL_COMMUNITY): Payer: Self-pay | Admitting: Vascular Surgery

## 2014-04-11 ENCOUNTER — Encounter (HOSPITAL_COMMUNITY)
Admission: AD | Disposition: A | Discharge status: Home / Self Care | Payer: Self-pay | Source: Ambulatory Visit | Attending: Vascular Surgery

## 2014-04-11 ENCOUNTER — Encounter (HOSPITAL_COMMUNITY): Payer: Medicare Other | Admitting: Certified Registered Nurse Anesthetist

## 2014-04-11 DIAGNOSIS — R579 Shock, unspecified: Secondary | ICD-10-CM | POA: Diagnosis not present

## 2014-04-11 DIAGNOSIS — N186 End stage renal disease: Secondary | ICD-10-CM

## 2014-04-11 DIAGNOSIS — R578 Other shock: Secondary | ICD-10-CM | POA: Diagnosis not present

## 2014-04-11 DIAGNOSIS — I8500 Esophageal varices without bleeding: Secondary | ICD-10-CM | POA: Diagnosis not present

## 2014-04-11 DIAGNOSIS — D500 Iron deficiency anemia secondary to blood loss (chronic): Secondary | ICD-10-CM | POA: Diagnosis not present

## 2014-04-11 DIAGNOSIS — T82898A Other specified complication of vascular prosthetic devices, implants and grafts, initial encounter: Secondary | ICD-10-CM | POA: Diagnosis not present

## 2014-04-11 DIAGNOSIS — G9340 Encephalopathy, unspecified: Secondary | ICD-10-CM | POA: Diagnosis not present

## 2014-04-11 HISTORY — PX: INSERTION OF DIALYSIS CATHETER: SHX1324

## 2014-04-11 LAB — RENAL FUNCTION PANEL
Albumin: 3.8 g/dL (ref 3.5–5.2)
Albumin: 3.3 g/dL — ABNORMAL LOW (ref 3.5–5.2)
BUN: 41 mg/dL — ABNORMAL HIGH (ref 6–23)
BUN: 35 mg/dL — AB (ref 6–23)
CALCIUM: 8.4 mg/dL (ref 8.4–10.5)
CO2: 20 meq/L (ref 19–32)
CO2: 22 mEq/L (ref 19–32)
CREATININE: 8.86 mg/dL — AB (ref 0.50–1.35)
CREATININE: 7.97 mg/dL — AB (ref 0.50–1.35)
Calcium: 8.7 mg/dL (ref 8.4–10.5)
Chloride: 102 mEq/L (ref 96–112)
Chloride: 96 mEq/L (ref 96–112)
GFR calc Af Amer: 7 mL/min — ABNORMAL LOW (ref >90)
GFR calc non Af Amer: 6 mL/min — ABNORMAL LOW (ref >90)
GFR calc non Af Amer: 6 mL/min — ABNORMAL LOW (ref >90)
GFR, EST AFRICAN AMERICAN: 6 mL/min — AB (ref >90)
GLUCOSE: 182 mg/dL — AB (ref 70–99)
Glucose, Bld: 205 mg/dL — ABNORMAL HIGH (ref 70–99)
PHOSPHORUS: 4.2 mg/dL (ref 2.3–4.6)
POTASSIUM: 5.4 meq/L — AB (ref 3.7–5.3)
Phosphorus: 4.9 mg/dL — ABNORMAL HIGH (ref 2.3–4.6)
Potassium: 6.5 mEq/L (ref 3.7–5.3)
Sodium: 142 mEq/L (ref 137–147)
Sodium: 136 mEq/L — ABNORMAL LOW (ref 137–147)

## 2014-04-11 LAB — TROPONIN I
Troponin I: <0.3 ng/mL (ref <0.30)
Troponin I: <0.3 ng/mL (ref <0.30)

## 2014-04-11 LAB — BASIC METABOLIC PANEL
BUN: 32 mg/dL — ABNORMAL HIGH (ref 6–23)
CALCIUM: 8.3 mg/dL — AB (ref 8.4–10.5)
CHLORIDE: 101 meq/L (ref 96–112)
CO2: 23 mEq/L (ref 19–32)
Creatinine, Ser: 7.63 mg/dL — ABNORMAL HIGH (ref 0.50–1.35)
GFR calc non Af Amer: 7 mL/min — ABNORMAL LOW (ref >90)
GFR, EST AFRICAN AMERICAN: 8 mL/min — AB (ref >90)
Glucose, Bld: 167 mg/dL — ABNORMAL HIGH (ref 70–99)
Potassium: 5 mEq/L (ref 3.7–5.3)
Sodium: 139 mEq/L (ref 137–147)

## 2014-04-11 LAB — POTASSIUM: Potassium: 5.1 mEq/L (ref 3.7–5.3)

## 2014-04-11 LAB — CBC
HCT: 34.7 % — ABNORMAL LOW (ref 39.0–52.0)
HEMOGLOBIN: 11.7 g/dL — AB (ref 13.0–17.0)
MCH: 30.2 pg (ref 26.0–34.0)
MCHC: 33.7 g/dL (ref 30.0–36.0)
MCV: 89.7 fL (ref 78.0–100.0)
Platelets: 75 10*3/uL — ABNORMAL LOW (ref 150–400)
RBC: 3.87 MIL/uL — AB (ref 4.22–5.81)
RDW: 15.8 % — ABNORMAL HIGH (ref 11.5–15.5)
WBC: 12.2 10*3/uL — ABNORMAL HIGH (ref 4.0–10.5)

## 2014-04-11 LAB — GLUCOSE, CAPILLARY
GLUCOSE-CAPILLARY: 121 mg/dL — AB (ref 70–99)
GLUCOSE-CAPILLARY: 155 mg/dL — AB (ref 70–99)
GLUCOSE-CAPILLARY: 165 mg/dL — AB (ref 70–99)
GLUCOSE-CAPILLARY: 174 mg/dL — AB (ref 70–99)
Glucose-Capillary: 155 mg/dL — ABNORMAL HIGH (ref 70–99)
Glucose-Capillary: 180 mg/dL — ABNORMAL HIGH (ref 70–99)
Glucose-Capillary: 151 mg/dL — ABNORMAL HIGH (ref 70–99)

## 2014-04-11 LAB — MRSA PCR SCREENING: MRSA by PCR: NEGATIVE

## 2014-04-11 LAB — LACTIC ACID, PLASMA
LACTIC ACID, VENOUS: 4.7 mmol/L — AB (ref 0.5–2.2)
Lactic Acid, Venous: 5.5 mmol/L — ABNORMAL HIGH (ref 0.5–2.2)

## 2014-04-11 SURGERY — INSERTION OF DIALYSIS CATHETER
Anesthesia: Monitor Anesthesia Care | Site: Groin | Laterality: Bilateral

## 2014-04-11 MED ORDER — HEPARIN SODIUM (PORCINE) 1000 UNIT/ML IJ SOLN
INTRAMUSCULAR | Status: AC
  Filled 2014-04-11: qty 1

## 2014-04-11 MED ORDER — DEXTROSE 5 % IV SOLN
100.0000 [IU] | INTRAVENOUS | Status: DC | PRN
  Administered 2014-04-11: .03 [IU]/min via INTRAVENOUS

## 2014-04-11 MED ORDER — PRISMASOL BGK 0/2.5 32-2.5 MEQ/L IV SOLN
INTRAVENOUS | Status: DC
  Administered 2014-04-11 (×2): via INTRAVENOUS_CENTRAL
  Filled 2014-04-11 (×13): qty 5000

## 2014-04-11 MED ORDER — BIOTENE DRY MOUTH MT LIQD: 15.0000 mL | Freq: Two times a day (BID) | OROMUCOSAL | Status: DC

## 2014-04-11 MED ORDER — LIDOCAINE HCL (PF) 1 % IJ SOLN
INTRAMUSCULAR | Status: DC | PRN
  Administered 2014-04-11: 30 mL

## 2014-04-11 MED ORDER — SODIUM CHLORIDE 0.9 % IJ SOLN
INTRAMUSCULAR | Status: AC
  Filled 2014-04-11: qty 10

## 2014-04-11 MED ORDER — PRISMASOL BGK 0/2.5 32-2.5 MEQ/L IV SOLN
INTRAVENOUS | Status: DC
  Filled 2014-04-11 (×2): qty 5000

## 2014-04-11 MED ORDER — FENTANYL CITRATE 0.05 MG/ML IJ SOLN
INTRAMUSCULAR | Status: DC | PRN
  Administered 2014-04-11: 25 ug via INTRAVENOUS

## 2014-04-11 MED ORDER — DEXTROSE 5 % IV SOLN: 0.3000 [IU]/min | INTRAVENOUS | Status: DC

## 2014-04-11 MED ORDER — PROPOFOL 10 MG/ML IV BOLUS
INTRAVENOUS | Status: AC
  Filled 2014-04-11: qty 20

## 2014-04-11 MED ORDER — METOCLOPRAMIDE HCL 5 MG/ML IJ SOLN
10.0000 mg | Freq: Three times a day (TID) | INTRAMUSCULAR | Status: DC
  Administered 2014-04-11 – 2014-04-26 (×46): 10 mg via INTRAVENOUS
  Filled 2014-04-11 (×54): qty 2

## 2014-04-11 MED ORDER — CHLORHEXIDINE GLUCONATE CLOTH 2 % EX PADS: 6.0000 | MEDICATED_PAD | Freq: Once | CUTANEOUS | Status: DC

## 2014-04-11 MED ORDER — VASOPRESSIN 20 UNIT/ML IJ SOLN: 0.0300 [IU]/min | INTRAMUSCULAR | Status: DC

## 2014-04-11 MED ORDER — MIDAZOLAM HCL 2 MG/2ML IJ SOLN
INTRAMUSCULAR | Status: AC
  Filled 2014-04-11: qty 2

## 2014-04-11 MED ORDER — LIDOCAINE HCL (PF) 1 % IJ SOLN
INTRAMUSCULAR | Status: AC
  Filled 2014-04-11: qty 30

## 2014-04-11 MED ORDER — HEPARIN SODIUM (PORCINE) 1000 UNIT/ML IJ SOLN
INTRAMUSCULAR | Status: DC | PRN
Start: 2014-04-11 — End: 2014-04-11
  Administered 2014-04-11: 5 mL

## 2014-04-11 MED ORDER — HEPARIN SODIUM (PORCINE) 5000 UNIT/ML IJ SOLN
INTRAMUSCULAR | Status: DC | PRN
  Administered 2014-04-11: 13:00:00

## 2014-04-11 MED ORDER — VASOPRESSIN 20 UNIT/ML IJ SOLN
0.0300 [IU]/min | INTRAVENOUS | Status: DC
  Administered 2014-04-12 – 2014-04-16 (×4): 0.03 [IU]/min via INTRAVENOUS
  Filled 2014-04-11 (×7): qty 2.5

## 2014-04-11 MED ORDER — SODIUM CHLORIDE 0.9 % IJ SOLN
10.0000 mL | INTRAMUSCULAR | Status: DC | PRN
  Administered 2014-04-11: 40 mL
  Administered 2014-04-17 – 2014-04-23 (×2): 10 mL
  Administered 2014-04-23 – 2014-04-24 (×2): 20 mL
  Administered 2014-04-24: 10 mL
  Administered 2014-04-24: 20 mL
  Administered 2014-04-25: 10 mL
  Administered 2014-04-25: 20 mL

## 2014-04-11 MED ORDER — LIDOCAINE HCL (CARDIAC) 20 MG/ML IV SOLN
INTRAVENOUS | Status: AC
  Filled 2014-04-11: qty 5

## 2014-04-11 MED ORDER — FENTANYL CITRATE 0.05 MG/ML IJ SOLN
INTRAMUSCULAR | Status: AC
  Filled 2014-04-11: qty 5

## 2014-04-11 MED ORDER — SODIUM CHLORIDE 0.9 % IJ SOLN
10.0000 mL | Freq: Two times a day (BID) | INTRAMUSCULAR | Status: DC
  Administered 2014-04-11 (×2): 40 mL
  Administered 2014-04-12: 20 mL
  Administered 2014-04-13 – 2014-04-17 (×7): 10 mL
  Administered 2014-04-17: 20 mL
  Administered 2014-04-17 – 2014-04-18 (×2): 40 mL
  Administered 2014-04-18: 10 mL
  Administered 2014-04-19: 30 mL
  Administered 2014-04-19 – 2014-04-25 (×7): 10 mL

## 2014-04-11 MED ORDER — VASOPRESSIN 20 UNIT/ML IJ SOLN
0.0300 [IU]/min | INTRAVENOUS | Status: DC
  Filled 2014-04-11: qty 2.5

## 2014-04-11 MED ORDER — SODIUM CHLORIDE 0.9 % IV SOLN
INTRAVENOUS | Status: DC
  Administered 2014-04-11: 18:00:00 via INTRAVENOUS

## 2014-04-11 MED ORDER — CHLORHEXIDINE GLUCONATE 0.12 % MT SOLN: 15.0000 mL | Freq: Two times a day (BID) | OROMUCOSAL | Status: DC

## 2014-04-11 MED ORDER — CHLORHEXIDINE GLUCONATE CLOTH 2 % EX PADS
6.0000 | MEDICATED_PAD | Freq: Once | CUTANEOUS | Status: AC
  Administered 2014-04-11: 6 via TOPICAL

## 2014-04-11 MED ORDER — SODIUM CHLORIDE 0.9 % IV SOLN
INTRAVENOUS | Status: DC | PRN
  Administered 2014-04-11: 14:00:00 via INTRAVENOUS

## 2014-04-11 MED ORDER — 0.9 % SODIUM CHLORIDE (POUR BTL) OPTIME
TOPICAL | Status: DC | PRN
Start: 2014-04-11 — End: 2014-04-11
  Administered 2014-04-11: 1000 mL

## 2014-04-11 MED ORDER — FENTANYL CITRATE 0.05 MG/ML IJ SOLN: 25.0000 ug | INTRAMUSCULAR | Status: DC | PRN

## 2014-04-11 SURGICAL SUPPLY — 49 items
BAG DECANTER FOR FLEXI CONT (MISCELLANEOUS) ×3 IMPLANT
BIOPATCH RED 1 DISK 7.0 (GAUZE/BANDAGES/DRESSINGS) ×1 IMPLANT
BIOPATCH RED 1IN DISK 7.0MM (GAUZE/BANDAGES/DRESSINGS) ×1
BLADE 10 SAFETY STRL DISP (BLADE) ×3 IMPLANT
CATH CANNON HEMO 15F 50CM (CATHETERS) IMPLANT
CATH CANNON HEMO 15FR 19 (HEMODIALYSIS SUPPLIES) IMPLANT
CATH CANNON HEMO 15FR 23CM (HEMODIALYSIS SUPPLIES) IMPLANT
CATH CANNON HEMO 15FR 31CM (HEMODIALYSIS SUPPLIES) ×2 IMPLANT
CATH CANNON HEMO 15FR 32 (HEMODIALYSIS SUPPLIES) IMPLANT
CATH CANNON HEMO 15FR 32CM (HEMODIALYSIS SUPPLIES) ×3 IMPLANT
CATH STRAIGHT 5FR 65CM (CATHETERS) IMPLANT
CHLORAPREP W/TINT 26ML (MISCELLANEOUS) ×1 IMPLANT
COVER PROBE W GEL 5X96 (DRAPES) ×2 IMPLANT
COVER SURGICAL LIGHT HANDLE (MISCELLANEOUS) ×3 IMPLANT
DECANTER SPIKE VIAL GLASS SM (MISCELLANEOUS) IMPLANT
DRAPE C-ARM 42X72 X-RAY (DRAPES) ×1 IMPLANT
DRAPE CHEST BREAST 15X10 FENES (DRAPES) ×3 IMPLANT
DRSG TEGADERM 4X4.75 (GAUZE/BANDAGES/DRESSINGS) ×2 IMPLANT
GAUZE SPONGE 2X2 8PLY STRL LF (GAUZE/BANDAGES/DRESSINGS) ×1 IMPLANT
GAUZE SPONGE 4X4 16PLY XRAY LF (GAUZE/BANDAGES/DRESSINGS) ×5 IMPLANT
GLOVE BIO SURGEON STRL SZ7.5 (GLOVE) ×3 IMPLANT
GLOVE SURG SS PI 7.5 STRL IVOR (GLOVE) ×4 IMPLANT
GOWN STRL REUS W/ TWL LRG LVL3 (GOWN DISPOSABLE) ×2 IMPLANT
GOWN STRL REUS W/TWL LRG LVL3 (GOWN DISPOSABLE) ×6
KIT BASIN OR (CUSTOM PROCEDURE TRAY) ×3 IMPLANT
KIT ROOM TURNOVER OR (KITS) ×3 IMPLANT
NDL 18GX1X1/2 (RX/OR ONLY) (NEEDLE) ×1 IMPLANT
NDL HYPO 25GX1X1/2 BEV (NEEDLE) ×1 IMPLANT
NEEDLE 18GX1X1/2 (RX/OR ONLY) (NEEDLE) ×3 IMPLANT
NEEDLE HYPO 25GX1X1/2 BEV (NEEDLE) ×3 IMPLANT
NS IRRIG 1000ML POUR BTL (IV SOLUTION) ×3 IMPLANT
PACK SURGICAL SETUP 50X90 (CUSTOM PROCEDURE TRAY) ×3 IMPLANT
PAD ARMBOARD 7.5X6 YLW CONV (MISCELLANEOUS) ×6 IMPLANT
SET MICROPUNCTURE 5F STIFF (MISCELLANEOUS) ×2 IMPLANT
SMART SITE NEEDLE FREE VALVE ×6 IMPLANT
SPONGE GAUZE 2X2 STER 10/PKG (GAUZE/BANDAGES/DRESSINGS) ×2
SUT ETHILON 3 0 PS 1 (SUTURE) ×3 IMPLANT
SUT VICRYL 4-0 PS2 18IN ABS (SUTURE) ×3 IMPLANT
SYR 20CC LL (SYRINGE) ×6 IMPLANT
SYR 5ML LL (SYRINGE) ×3 IMPLANT
SYR CONTROL 10ML LL (SYRINGE) ×3 IMPLANT
SYRINGE 10CC LL (SYRINGE) ×3 IMPLANT
TAPE CLOTH SURG 4X10 WHT LF (GAUZE/BANDAGES/DRESSINGS) ×2 IMPLANT
TOWEL OR 17X24 6PK STRL BLUE (TOWEL DISPOSABLE) ×3 IMPLANT
TOWEL OR 17X26 10 PK STRL BLUE (TOWEL DISPOSABLE) ×3 IMPLANT
WATER STERILE IRR 1000ML POUR (IV SOLUTION) ×3 IMPLANT
WIRE AMPLATZ SS-J .035X180CM (WIRE) IMPLANT
WIRE BENTSON .035X145CM (WIRE) ×2 IMPLANT
WIRE ROSEN-J .035X180CM (WIRE) ×2 IMPLANT

## 2014-04-11 NOTE — Op Note (Signed)
    Patient name: Rowe Clackeal W Meskill MRN: [id number] DOB: 28-Mar-1951 Sex: male  04/10/2014 - 04/11/2014 Pre-operative Diagnosis: End-stage renal disease Post-operative diagnosis:  Same Surgeon:  Jorge NyBRABHAM IV, V. WELLS Assistants:  None Procedure:   #1: Placement of left femoral vein ultrasound-guided triple-lumen catheter   #2: Removal of hemodialysis catheter   #3: Placement of right femoral tunneled dialysis catheter Anesthesia:  MAC Blood Loss:  See anesthesia record Specimens:  None   Indications:  The patient underwent a failed attempt at revision of his left thigh dialysis graft by Dr. Darrick Pennafields yesterday.  He had a temporary catheter placed in the right groin which is not functioning properly.  He needs new permanent access  Procedure:  The patient was identified in the holding area [facility]  The patient was then placed supine on the table. MAC anesthesia was administered.  The patient was prepped and draped in the usual sterile fashion.  A time out was called and antibiotics were administered.  The patient required significant amounts of hemodynamic support.  He has an occluded superior vena cava and the only access he has a IJ PIV.  He was receiving leave that through the dialysis catheter.  Before switching this out to a dual-lumen hemodialysis catheter he needed more central access.  I tried to cannulate the common femoral vein bilaterally under ultrasound guidance.  Because of all the scar tissue I could not visualize the vein.  I tried to cannulate this blindly but was unsuccessful.  I therefore elected to stick the femoral vein in the mid thigh which I could visualize under ultrasound guidance.  This was cannulated with a micropuncture needle under ultrasound guidance.  An 018 wire was advanced without resistance followed by micropuncture sheath.  A Rosen wire was then placed in the subcutaneous tract dilated.  I confirmed with fluoroscopy that the wire went into the iliac vein.   A triple-lumen catheter was then placed without resistance.  All 3 ports flushed and aspirated.  Fluoroscopy confirmed the tip of the catheter was in the external iliac vein.  The catheter was then sutured into position.  Next attention was turned towards the right groin.  The existing catheter was wired with 8035 Pollyann Kennedyosen wire had been removed.  A introducer sheath was then placed through the venotomy.  I initially advanced a 50 cm Diatek catheter, however this was clearly too long.  I therefore switched out to 27 cm catheter.  The catheter tip was in the inferior vena cava.  A separate skin exit site was selected.  The skin was anesthetized with 1% lidocaine.  A #11 blade was used to make a skin neck.  A subcutaneous tunnel was then created and then dilated.  The cath was brought through the tunnel and the cuff situated the skin exit site.  Both ports flushed and aspirated without difficulty.  The catheter sewn into position with 3-0 nylon.  The incision in the groin was closed with 4-0 Vicryl.  Fluoroscopic imaging revealed that the tip of the catheter was in good position within the inferior vena cava.  Sterile dressings were applied   Disposition:  To PACU in stable condition.   Juleen ChinaV. Wells Brabham, M.D. [facility] [place] Office: [phone] Pager:  [phone]

## 2014-04-11 NOTE — Progress Notes (Signed)
eLink Physician-Brief Progress Note Patient Name: [name] DOB: 1951-03-07 MRN: [id number]  Date of Service  04/11/2014   HPI/Events of Note   Patient on vasopressin. But no orders. MAP 58 on [id number] and vasopressin  eICU Interventions  Vasopressin order sent   Intervention Category Intermediate Interventions: Other:  Ladesha Pacini 04/11/2014, 9:40 PM

## 2014-04-11 NOTE — Plan of Care (Signed)
Problem: Phase I Progression Outcomes Goal: Hemodynamically stable Outcome: Not Progressing Remains on norepinephrine drip.

## 2014-04-11 NOTE — H&P (Signed)
Patient name: [name] MRN: [id number] DOB: [birth year] Sex: male    No chief complaint on file.   HISTORY OF PRESENT ILLNESS: Non functioning thigh graft, now getting HD via temp fem cath  Past Medical History  Diagnosis Date  . Heart murmur, systolic 08/10/2009  . Syncope 05/07/2009  . 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 Esophageal varices 11/07/2008  . Gastric ulcer 10/09    antral, with h pylori positive  . Congestive heart failure 03/06/2008  . Cellulitis and abscess of leg, except foot 03/06/2008  . Secondary hyperparathyroidism 02/02/2008  . Mute 02/02/2008  . Hyperlipidemia 02/02/2008  . Anemia 02/02/2008  . ESRD (end stage renal disease)     TTS hemodialysis  . GERD (gastroesophageal reflux disease)   . Hypertension 02/02/2008    in history  . Mental retardation 02/02/2008  . Mute   . Seizures     "non in a while at home". none in past year .  Marland Kitchen. Complication of anesthesia     in the past BP has dropped     Past Surgical History  Procedure Laterality Date  . Left forearm graft      for HD  . Arteriovenous graft placement  11/22/10    Right thigh AVG  . Thrombectomy and revision of arterioventous (av) goretex  graft    . Thrombectomy and revision of arterioventous (av) goretex  graft  10/22/2012    Procedure: THROMBECTOMY AND REVISION OF ARTERIOVENTOUS (AV) GORETEX  GRAFT;  Surgeon: Larina Earthlyodd F Early, MD;  [facility];  Service: Vascular;  Laterality: Right;  . Thrombectomy w/ embolectomy  11/10/2012    Procedure: THROMBECTOMY ARTERIOVENOUS GORE-TEX GRAFT;  Surgeon: Pryor OchoaJames D Lawson, MD;  [facility];  Service: Vascular;  Laterality: Right;  . Thrombectomy and revision of arterioventous (av) goretex  graft Right 12/08/2012    Procedure: THROMBECTOMY AND REVISION OF ARTERIOVENTOUS (AV) GORETEX  GRAFT right thigh;  Surgeon: Sherren Kernsharles E Fields, MD;  [facility];  Service: Vascular;  Laterality: Right;  Susie Cassette. Venogram N/A 12/08/2012    Procedure:  VENOGRAM;  Surgeon: Sherren Kernsharles E Fields, MD;  [facility];  Service: Vascular;  Laterality: N/A;  Intraoperative Central venogram  . Thrombectomy w/ embolectomy Right 12/12/2012    Procedure: THROMBECTOMY ARTERIOVENOUS GORE-TEX GRAFT;  Surgeon: Nada LibmanVance W Brabham, MD;  [facility];  Service: Vascular;  Laterality: Right;  . Insertion of dialysis catheter Left 12/14/2012    Procedure: INSERTION OF DIALYSIS CATHETER;  Surgeon: Nada LibmanVance W Brabham, MD;  [facility];  Service: Vascular;  Laterality: Left;  . Insertion of dialysis catheter Right 01/13/2013    Procedure: INSERTION OF DIALYSIS CATHETER;  Surgeon: Nada LibmanVance W Brabham, MD;  [facility];  Service: Vascular;  Laterality: Right;  . Removal of a dialysis catheter Left 01/13/2013    Procedure: REMOVAL OF A DIALYSIS CATHETER;  Surgeon: Nada LibmanVance W Brabham, MD;  [facility]: [facility] OR;  Service: Vascular;  Laterality: Left;  . Av fistula placement Left 02/11/2013    Procedure: INSERTION OF ARTERIOVENOUS (AV) GORE-TEX GRAFT THIGH;  Surgeon: Nada LibmanVance W Brabham, MD;  Location: [facility] OR;  Service: Vascular;  Laterality: Left;  using 6mm x 50cm Gore-Tex Vascular Graft  . Esophagogastroduodenoscopy N/A 02/16/2013    Procedure: ESOPHAGOGASTRODUODENOSCOPY (EGD);  Surgeon: Vertell NovakJames L Edwards Jr., MD;  [facility];  Service: Endoscopy;  Laterality: N/A;  bedside  . Esophagogastroduodenoscopy N/A 09/14/2013    Procedure: ESOPHAGOGASTRODUODENOSCOPY (EGD);  Surgeon: Tresea MallJames L Edwards  Montez HagemanJr., MD;  [facility];  Service: Endoscopy;  Laterality: N/A;  control of bleeding if needed  . Esophagogastroduodenoscopy N/A 10/03/2013    Procedure: ESOPHAGOGASTRODUODENOSCOPY (EGD);  Surgeon: Theda BelfastPatrick D Hung, MD;  [facility];  Service: Endoscopy;  Laterality: N/A;  Bedside  . Radiology with anesthesia Right 10/19/2013    Procedure: RADIOLOGY WITH ANESTHESIA;  Surgeon: Malachy MoanHeath McCullough, MD;  [facility];  Service: Radiology;  Laterality: Right;  . Radiology with  anesthesia Left 12/28/2013    Procedure: RADIOLOGY WITH ANESTHESIA;  Surgeon: Reola CalkinsGlenn T Yamagata, MD;  [facility];  Service: Radiology;  Laterality: Left;  . Thrombectomy and revision of arterioventous (av) goretex  graft Left 01/25/2014    Procedure: THROMBECTOMY AND REVISION OF LEFT THIGH ARTERIOVENTOUS (AV) GORETEX  GRAFT WITH PATCH ANGIOPLASTY;  Surgeon: Pryor OchoaJames D Lawson, MD;  [facility];  Service: Vascular;  Laterality: Left;    History   Social History  . Marital Status: Single    Spouse Name: N/A    Number of Children: 0  . Years of Education: N/A   Occupational History  .     Social History Main Topics  . Smoking status: Never Smoker   . Smokeless tobacco: Never Used  . Alcohol Use: No  . Drug Use: No  . Sexual Activity: No   Other Topics Concern  . Not on file   Social History Narrative   Patient is living with care providers.    Patient is right handed.    Patient does not have any children.    Patient is on disability           History reviewed. No pertinent family history.  Allergies as of 04/08/2014  . (No Known Allergies)    No current facility-administered medications on file prior to encounter.   Current Outpatient Prescriptions on File Prior to Encounter  Medication Sig Dispense Refill  . atorvastatin (LIPITOR) 20 MG tablet Take 20 mg by mouth at bedtime.       . clonazePAM (KLONOPIN) 0.5 MG tablet Take 0.25 mg by mouth 2 (two) times daily.      . folic acid-vitamin b complex-vitamin c-selenium-zinc (DIALYVITE) 3 MG TABS tablet Take 1 tablet by mouth daily.      Marland Kitchen. lidocaine (LMX) 4 % cream Apply 1 application topically 3 (three) times a week. On Tuesday, Thursday, and Saturday      . midodrine (PROAMATINE) 10 MG tablet Take 15 mg by mouth 3 (three) times daily.       . pantoprazole (PROTONIX) 40 MG tablet Take 40 mg by mouth daily.      . sevelamer carbonate (RENVELA) 800 MG tablet Take 800 mg by mouth 3 (three) times daily with meals.          PHYSICAL EXAMINATION:   Vital signs are BP 87/54  Pulse 125  Temp(Src) 96.6 F (35.9 C) (Axillary)  Resp 20  Ht 5' (1.524 m)  Wt 129 lb 10.1 oz (58.8 kg)  BMI 25.32 kg/m2  SpO2 100% General: The patient appears their stated age. Very alert HEENT:  No gross abnormalities Pulmonary:  Non labored breathing Musculoskeletal: There are no major deformities. Neurologic: Non-verbal Skin: There are no ulcer or rashes noted. Psychiatric: The patient has normal affect. Cardiovascular: occluded left thigh AVGG  Assessment: ESRD  Plan: Diatek to right groin.  [name] gave consent  V. Charlena CrossWells Brabham IV, M.D. [facility] [place] Office: [phone] Pager:  [phone]

## 2014-04-11 NOTE — Progress Notes (Addendum)
CRRT started around 1830. Beginning at 1900, access and return pressures reading continuously high. No kinks present in lines, dressing taken down and noted to be positioned well. Pt repositioned several times, lines both flushed and aspirated well, access and return lines switched, but nothing was effective. Dr. Marisue HumbleSanford notified and gave orders to check a potassium level, heparin-lock the HD catheter and stop CRRT. Orders to resume in AM. Last potassium level was 5.4 and if potassium remains under 5.5, per MD it is okay to change all CRRT fluid orders to 4/2.5 Prismasol in AM. BMP collected. Dr. Hyman HopesWebb informed as well. Will continue to monitor pt.  Access and Return catheters heparin-locked with 2.4cc and 2.6cc of Heparin respectively. Doses verified by Alyce PaganAllison Haggard, RN.

## 2014-04-11 NOTE — OR Nursing (Signed)
Patient unable to answer questions. I tried to reach the patient's sister and emergency contact, [name], via telephone to answer questions but was unsuccessful. I reviewed the consent and saw that it had been signed by [name]. I reviewed his chart to assess for npo status,known allergies, and past surgical history as well as confirmed my findings with the short stay nurse who was taking care of the patient.

## 2014-04-11 NOTE — Transfer of Care (Signed)
Immediate Anesthesia Transfer of Care Note  Patient: [name]  Procedure(s) Performed: Procedure(s): INSERTION OF DIALYSIS CATHETER RIGHT FEMORAL VEIN AND INSERTION OF TRIPLE LUMEN LEFT FEMORAL VEIN CENTRAL LINE; REMOVAL OF DIALYSIS CATHETER IN RIGHT FEMORAL VEIN. (Bilateral)  Patient Location: PACU  Anesthesia Type:MAC  Level of Consciousness: awake and alert   Airway & Oxygen Therapy: Patient Spontanous Breathing and Patient connected to nasal cannula oxygen  Post-op Assessment: Report given to PACU RN and Post -op Vital signs reviewed and stable  Post vital signs: Reviewed and stable  Complications: No apparent anesthesia complications

## 2014-04-11 NOTE — Progress Notes (Signed)
[facility] ROUNDING NOTE   Subjective:   Interval History:  Awake and alert, femoral catheter is very sluggish  Objective:  Vital signs in last 24 hours:  Temp:  [95.7 F (35.4 C)-97.4 F (36.3 C)] 96.6 F (35.9 C) (06/23 0729) Pulse Rate:  [76-133] 101 (06/23 0715) Resp:  [9-41] 16 (06/23 0715) BP: (60-153)/(24-73) 86/61 mmHg (06/23 0715) SpO2:  [90 %-100 %] 100 % (06/23 0715) Weight:  [57.1 kg (125 lb 14.1 oz)-58.8 kg (129 lb 10.1 oz)] 58.8 kg (129 lb 10.1 oz) (06/23 0500)  Weight change:  Filed Weights   04/10/14 0705 04/10/14 1800 04/11/14 0500  Weight: 53.524 kg (118 lb) 57.1 kg (125 lb 14.1 oz) 58.8 kg (129 lb 10.1 oz)    Intake/Output: I/O last 3 completed shifts: In: 3216.1 [I.V.:1866.1; Blood:850; IV Piggyback:500] Out: 2408 [Emesis/NG output:200; UJWJX:9147Other:1208; Blood:1000]   Intake/Output this shift:    SVC syndrome CVS- RRR   RS- CTA ABD- BS present soft non-distended EXT- no edema   Basic Metabolic Panel:  Recent Labs Lab 04/10/14 1011 04/10/14 1440 04/10/14 1602 04/11/14 0500  NA 142 141 149* 142  K 6.1* 6.5* 5.3 6.5*  CL  --   --  107 102  CO2  --   --  18* 20  GLUCOSE 90 173* 177* 182*  BUN  --   --  60* 41*  CREATININE  --   --  13.24* 8.86*  CALCIUM  --   --  8.7 8.4  PHOS  --   --   --  4.9*    Liver Function Tests:  Recent Labs Lab 04/10/14 1602 04/11/14 0500  AST 15  --   ALT 11  --   ALKPHOS 97  --   BILITOT 0.3  --   PROT 5.3*  --   ALBUMIN 3.2* 3.8   No results found for this basename: LIPASE, AMYLASE,  in the last 168 hours No results found for this basename: AMMONIA,  in the last 168 hours  CBC:  Recent Labs Lab 04/10/14 1011 04/10/14 1440 04/10/14 1602 04/11/14 0500  WBC  --   --  9.0 12.2*  HGB 11.9* 9.9* 6.8* 11.7*  HCT 35.0* 29.0* 21.4* 34.7*  MCV  --   --  97.3 89.7  PLT  --   --  177 75*    Cardiac Enzymes:  Recent Labs Lab 04/10/14 1824 04/10/14 2330 04/11/14 0513  TROPONINI  <0.30 <0.30 <0.30    BNP: No components found with this basename: POCBNP,   CBG:  Recent Labs Lab 04/10/14 1934 04/10/14 2348 04/11/14 0344  GLUCAP 136* 121* 155*    Microbiology: Results for orders placed during the hospital encounter of 04/10/14  MRSA PCR SCREENING     Status: None   Collection Time    04/11/14 12:56 AM      Result Value Ref Range Status   MRSA by PCR NEGATIVE  NEGATIVE Final   Comment:            The GeneXpert MRSA Assay (FDA     approved for NASAL specimens     only), is one component of a     comprehensive MRSA colonization     surveillance program. It is not     intended to diagnose MRSA     infection nor to guide or     monitor treatment for     MRSA infections.    Coagulation Studies: No results found for this basename: LABPROT,  INR,  in the last 72 hours  Urinalysis: No results found for this basename: COLORURINE, APPERANCEUR, LABSPEC, PHURINE, GLUCOSEU, HGBUR, BILIRUBINUR, KETONESUR, PROTEINUR, UROBILINOGEN, NITRITE, LEUKOCYTESUR,  in the last 72 hours    Imaging: Dg Chest Port 1 View  04/10/2014   CLINICAL DATA:  Renal failure, postoperative evaluation, fluid overload  EXAM: PORTABLE CHEST - 1 VIEW  COMPARISON:  None.  FINDINGS: Low lung volumes. Normal heart size and vascularity. Minor left base atelectasis is evident. Negative for edema or CHF. No pneumothorax. Trachea is midline. Atherosclerosis of the aorta. Gastric distention noted in the left upper quadrant.  IMPRESSION: Low volume exam with left basilar atelectasis.   Electronically Signed   By: Ruel Favorsrevor  Shick M.D.   On: 04/10/2014 17:05     Medications:   . sodium chloride    . norepinephrine (LEVOPHED) Adult infusion 12 mcg/min (04/11/14 0700)  . dialysis replacement fluid (prismasate)    . dialysis replacement fluid (prismasate)    . dialysate (PRISMASATE)     . atorvastatin  20 mg Oral QHS  . cinacalcet  30 mg Oral BID WC  . clonazePAM  0.25 mg Oral BID  . hydrocortisone  sodium succinate  50 mg Intravenous Q6H  . insulin aspart  0-15 Units Subcutaneous 6 times per day  . midodrine  15 mg Oral TID  . multivitamin  1 tablet Oral QHS  . pantoprazole (PROTONIX) IV  40 mg Intravenous Q24H  . phenytoin (DILANTIN) IV  200 mg Intravenous QHS  . senna  1 tablet Oral BID  . sevelamer carbonate  800 mg Oral TID WC   acetaminophen, acetaminophen, diphenhydrAMINE, fentaNYL, heparin, ondansetron, phenol, sodium chloride  Assessment/ Plan:   ESRD- CRRT therapy  ANEMIA- Hb increased 11.7  MBD- controlled  HTN/VOL-stable still hypotensive   ACCESS- Will need to discuss options today   OTHER- hyperkalemia will remove K from replacement   LOS: 1 WEBB,MARTIN W @TODAY @7 :43 AM

## 2014-04-11 NOTE — Anesthesia Postprocedure Evaluation (Signed)
Anesthesia Post Note  Patient: [name]  Procedure(s) Performed: Procedure(s) (LRB): INSERTION OF DIALYSIS CATHETER RIGHT FEMORAL VEIN; INSERTION OF TRIPLE LUMEN LEFT FEMORAL VEIN CENTRAL LINE; REMOVAL OF DIALYSIS CATHETER IN RIGHT FEMORAL VEIN. (Bilateral)  Anesthesia type: MAC  Patient location: PACU  Post pain: Pain level controlled and Adequate analgesia  Post assessment: Post-op Vital signs reviewed, Patient's Cardiovascular Status Stable and Respiratory Function Stable  Last Vitals:  Filed Vitals:   04/11/14 1629  BP: 93/64  Pulse: 123  Temp:   Resp: 23    Post vital signs: Reviewed and stable  Level of consciousness: awake, alert  and oriented  Complications: No apparent anesthesia complications

## 2014-04-11 NOTE — Progress Notes (Signed)
[facility] [place]  Subjective  - Pt awake looks around not really interactive or mobile   Objective 81/58 98 96.6 F (35.9 C) (Axillary) 16 100%  Intake/Output Summary (Last 24 hours) at 04/11/14 0831 Last data filed at 04/11/14 0800  Gross per 24 hour  Intake 3351.14 ml  Output   2466 ml  Net 885.14 ml   Still on Levophed BP 75-100 Left groin incision clean no hematoma Feet warm doppler signals bilat CVVH in progress right femoral catheter Left thigh graft no flow   Assessment/Planning: S/p graft thrombectomy with repair of left SFA yesterday overall stable but still requiring pressor support.  Marginal function of femoral catheter, left thigh graft occluded.  1.  Will convert to femoral tunneled cath later today 2.  Will need potassium corrected prior to OR cont CVVH repeat K around 10 am 3.  Try to wean levophed tolerate SBP 80 per Dr Hyman HopesWebb 4.  Attempted to call sister Veva HolesJamie Mills [phone] to update her and obtain consent.  Got her voicemail left her number to call.   FIELDS,CHARLES E 04/11/2014 8:31 AM --  Laboratory Lab Results:  Recent Labs  04/10/14 1602 04/11/14 0500  WBC 9.0 12.2*  HGB 6.8* 11.7*  HCT 21.4* 34.7*  PLT 177 75*   BMET  Recent Labs  04/10/14 1602 04/11/14 0500  NA 149* 142  K 5.3 6.5*  CL 107 102  CO2 18* 20  GLUCOSE 177* 182*  BUN 60* 41*  CREATININE 13.24* 8.86*  CALCIUM 8.7 8.4    COAG Lab Results  Component Value Date   INR 1.13 10/02/2013   INR 1.00 09/14/2013   INR 1.47 02/13/2013   No results found for this basename: PTT

## 2014-04-11 NOTE — Anesthesia Preprocedure Evaluation (Addendum)
Anesthesia Evaluation  Patient identified by MRN, date of birth, ID band Patient confused and Patient unresponsive    Reviewed: Allergy & Precautions, H&P , NPO status , Patient's Chart, lab work & pertinent test results, Unable to perform ROS - Chart review only  History of Anesthesia Complications (+) DIFFICULT AIRWAY and history of anesthetic complications  Airway   Neck ROM: Limited    Dental   Pulmonary neg pulmonary ROS,  breath sounds clear to auscultation        Cardiovascular hypertension, Pt. on medications + Peripheral Vascular Disease (h/o SVC syndrome) Rhythm:Regular Rate:Tachycardia  '14 ECHO: EF 60-65%, valves OK   Neuro/Psych Seizures -, Well Controlled,  PSYCHIATRIC DISORDERS Anxiety Mute, mental deficiency    GI/Hepatic Neg liver ROS, GERD-  Medicated and Controlled,  Endo/Other  negative endocrine ROS  Renal/GU ESRF and DialysisRenal disease     Musculoskeletal   Abdominal   Peds  (+) mental retardation Hematology   Anesthesia Other Findings   Reproductive/Obstetrics                          Anesthesia Physical Anesthesia Plan  ASA: IV and emergent  Anesthesia Plan: MAC   Post-op Pain Management:    Induction:   Airway Management Planned: Nasal Cannula  Additional Equipment:   Intra-op Plan:   Post-operative Plan:   Informed Consent: I have reviewed the patients History and Physical, chart, labs and discussed the procedure including the risks, benefits and alternatives for the proposed anesthesia with the patient or authorized representative who has indicated his/her understanding and acceptance.     Plan Discussed with: CRNA and Anesthesiologist  Anesthesia Plan Comments: (Requiring high dose norepinephrine for hemodynamic support K-5.1  Plan MAC  Kipp Broodavid Joslin, MD)       Anesthesia Quick Evaluation

## 2014-04-11 NOTE — Addendum Note (Signed)
Addendum created 04/11/14 0846 by Bishop LimboJennifer S. Carter, CRNA   Modules edited: Charges VN

## 2014-04-11 NOTE — Progress Notes (Signed)
PULMONARY / CRITICAL CARE MEDICINE  Name: [name] MRN: [id number] DOB: [birth year]    ADMISSION DATE:  04/10/2014 CONSULTATION DATE:  04/10/2014  REFERRING MD :  Darrick Penna PRIMARY SERVICE:  Vascular  CHIEF COMPLAINT:  Hypotension  BRIEF PATIENT DESCRIPTION: 63 yo baseline non verbal with ESRD on HD admitted for revision of thrombosed R AV graft. The procedure was complicated by injury to left superficial femoral artery requiring patch and significant blood loss.  Brought to PACU hypotensive, requiring vasopressors.   SIGNIFICANT EVENTS / STUDIES:  6/22  Revision of thrombosed AV graft complicated by arterial injury requiring patch, blood loss and hypotension  LINES / TUBES: R F HD cath 6/22 >>>  CULTURES:  ANTIBIOTICS: Cefazolin 6/22 x 1  INTERVAL HISTORY:  CRRT started last night  VITAL SIGNS: Temp:  [95.7 F (35.4 C)-97.4 F (36.3 C)] 96.6 F (35.9 C) (06/23 0729) Pulse Rate:  [76-133] 98 (06/23 0800) Resp:  [9-41] 16 (06/23 0800) BP: (60-153)/(24-73) 81/58 mmHg (06/23 0800) SpO2:  [90 %-100 %] 100 % (06/23 0800) Weight:  [57.1 kg (125 lb 14.1 oz)-58.8 kg (129 lb 10.1 oz)] 58.8 kg (129 lb 10.1 oz) (06/23 0500)  HEMODYNAMICS:   VENTILATOR SETTINGS:   INTAKE / OUTPUT: Intake/Output     06/22 0701 - 06/23 0700 06/23 0701 - 06/24 0700   I.V. (mL/kg) 1946.1 (33.1) 55 (0.9)   Blood 850    IV Piggyback 500    Total Intake(mL/kg) 3296.1 (56.1) 55 (0.9)   Emesis/NG output 200    Other 1208 58   Blood 1000    Total Output 2408 58   Net +888.1 -3          PHYSICAL EXAMINATION: General:  Comfortable, no distress Neuro:  Non verbal, but more awake than last night HEENT:  PERRL Cardiovascular:  Regular, no murmurs Lungs:  Bilateral diminished air entry Abdomen:  Soft, nontender, bowel sounds diminished Musculoskeletal:  Moves all extremities Skin:  Surgical site L groin intact, old scars / graft L groin, new HD cath site intact  LABS: CBC  Recent Labs Lab  04/10/14 1440 04/10/14 1602 04/11/14 0500  WBC  --  9.0 12.2*  HGB 9.9* 6.8* 11.7*  HCT 29.0* 21.4* 34.7*  PLT  --  177 75*   Coag's No results found for this basename: APTT, INR,  in the last 168 hours  BMET  Recent Labs Lab 04/10/14 1440 04/10/14 1602 04/11/14 0500  NA 141 149* 142  K 6.5* 5.3 6.5*  CL  --  107 102  CO2  --  18* 20  BUN  --  60* 41*  CREATININE  --  13.24* 8.86*  GLUCOSE 173* 177* 182*   Electrolytes  Recent Labs Lab 04/10/14 1602 04/11/14 0500  CALCIUM 8.7 8.4  PHOS  --  4.9*   Sepsis Markers  Recent Labs Lab 04/10/14 1824 04/10/14 2300 04/11/14 0513  LATICACIDVEN 5.4* 4.7* 5.5*   ABG No results found for this basename: PHART, PCO2ART, PO2ART,  in the last 168 hours Liver Enzymes  Recent Labs Lab 04/10/14 1602 04/11/14 0500  AST 15  --   ALT 11  --   ALKPHOS 97  --   BILITOT 0.3  --   ALBUMIN 3.2* 3.8   Cardiac Enzymes  Recent Labs Lab 04/10/14 1824 04/10/14 2330 04/11/14 0513  TROPONINI <0.30 <0.30 <0.30   Glucose  Recent Labs Lab 04/10/14 1934 04/10/14 2348 04/11/14 0344  GLUCAP 136* 121* 155*    IMAGES: Dg  Chest Port 1 View  04/10/2014   CLINICAL DATA:  Renal failure, postoperative evaluation, fluid overload  EXAM: PORTABLE CHEST - 1 VIEW  COMPARISON:  None.  FINDINGS: Low lung volumes. Normal heart size and vascularity. Minor left base atelectasis is evident. Negative for edema or CHF. No pneumothorax. Trachea is midline. Atherosclerosis of the aorta. Gastric distention noted in the left upper quadrant.  IMPRESSION: Low volume exam with left basilar atelectasis.   Electronically Signed   By: Ruel Favors M.D.   On: 04/10/2014 17:05   ASSESSMENT / PLAN:  PULMONARY A:   No active issues P:   Goal SpO2>92 Supplemental oxygen Incentive spirometry / flutter valve / pulmonary hygiene  CARDIOVASCULAR A:  Chronic hypotension Shock ( hemorrhagic / hypovolemic / vasodilatory from general anesthesia; doubt  septic ) Occluded L groin AV graft s/p attempted repair, femoral artery injury and patch Occluded R groin AV graft Chronically occluded SVC Difficult IV access P:  Goal SBP>80 Levophed gtt, titrate to off Midodrine Lipitor  RENAL A:   ESRD on HD Hyperkalemia P:   Renal consulted ( Dr. Hyman Hopes, by Dr. Darrick Penna, 6/22 ) Trend BMP CVVH IR to exchange R groin cath for tunneled Sensipar / Renvela   GASTROINTESTINAL A:   Nausea / vomiting post op  GERD on PPI P:   Diet when able Protonix Start Reglan scheduled Zofran PRM  HEMATOLOGIC A:  Acute blood loss anemia, resolved after transfusion VTE Px P:  Trend CBC  INFECTIOUS A:   Doubt acute infection P:   Monitor off abx  ENDOCRINE  A:  Adrenal insufficiency ( cortisol 9 09/15/2013 ) At risk for hypo / hyperglycemia P:   Hydrocortisone 50 mg q6h SSI  NEUROLOGIC A:   Baseline nonverbal History of seizures, none recent Post op pain P:   Fentanyl PRN Dilantin Preadmission Klonopin bid  I have personally obtained history, examined patient, evaluated and interpreted laboratory and imaging results, reviewed medical records, formulated assessment / plan and placed orders.  CRITICAL CARE:  The patient is critically ill with multiple organ systems failure and requires high complexity decision making for assessment and support, frequent evaluation and titration of therapies, application of advanced monitoring technologies and extensive interpretation of multiple databases. Critical Care Time devoted to patient care services described in this note is 35 minutes.   Lonia Farber, MD Pulmonary and Critical Care Medicine [facility] Pager: [phone]  04/11/2014, 8:31 AM

## 2014-04-11 NOTE — Progress Notes (Signed)
Utilization Review Completed.Dowell, Deborah T6/23/2015  

## 2014-04-12 DIAGNOSIS — R4701 Aphasia: Secondary | ICD-10-CM

## 2014-04-12 DIAGNOSIS — R569 Unspecified convulsions: Secondary | ICD-10-CM

## 2014-04-12 LAB — RENAL FUNCTION PANEL
ALBUMIN: 2.9 g/dL — AB (ref 3.5–5.2)
Albumin: 3.1 g/dL — ABNORMAL LOW (ref 3.5–5.2)
BUN: 35 mg/dL — ABNORMAL HIGH (ref 6–23)
BUN: 28 mg/dL — ABNORMAL HIGH (ref 6–23)
CHLORIDE: 98 meq/L (ref 96–112)
CO2: 22 meq/L (ref 19–32)
CO2: 24 meq/L (ref 19–32)
CREATININE: 8.49 mg/dL — AB (ref 0.50–1.35)
CREATININE: 6 mg/dL — AB (ref 0.50–1.35)
Calcium: 8.9 mg/dL (ref 8.4–10.5)
Calcium: 8.7 mg/dL (ref 8.4–10.5)
Chloride: 99 mEq/L (ref 96–112)
GFR calc Af Amer: 7 mL/min — ABNORMAL LOW (ref >90)
GFR calc Af Amer: 10 mL/min — ABNORMAL LOW (ref >90)
GFR, EST NON AFRICAN AMERICAN: 6 mL/min — AB (ref >90)
GFR, EST NON AFRICAN AMERICAN: 9 mL/min — AB (ref >90)
GLUCOSE: 177 mg/dL — AB (ref 70–99)
Glucose, Bld: 147 mg/dL — ABNORMAL HIGH (ref 70–99)
POTASSIUM: 5.1 meq/L (ref 3.7–5.3)
Phosphorus: 4.6 mg/dL (ref 2.3–4.6)
Phosphorus: 4.3 mg/dL (ref 2.3–4.6)
Potassium: 5.2 mEq/L (ref 3.7–5.3)
Sodium: 137 mEq/L (ref 137–147)
Sodium: 135 mEq/L — ABNORMAL LOW (ref 137–147)

## 2014-04-12 LAB — CBC
HEMATOCRIT: 23.4 % — AB (ref 39.0–52.0)
HEMOGLOBIN: 8 g/dL — AB (ref 13.0–17.0)
MCH: 30.3 pg (ref 26.0–34.0)
MCHC: 34.2 g/dL (ref 30.0–36.0)
MCV: 88.6 fL (ref 78.0–100.0)
Platelets: 64 10*3/uL — ABNORMAL LOW (ref 150–400)
RBC: 2.64 MIL/uL — AB (ref 4.22–5.81)
RDW: 16.1 % — ABNORMAL HIGH (ref 11.5–15.5)
WBC: 8.4 10*3/uL (ref 4.0–10.5)

## 2014-04-12 LAB — GLUCOSE, CAPILLARY
GLUCOSE-CAPILLARY: 134 mg/dL — AB (ref 70–99)
GLUCOSE-CAPILLARY: 170 mg/dL — AB (ref 70–99)
Glucose-Capillary: 174 mg/dL — ABNORMAL HIGH (ref 70–99)
Glucose-Capillary: 136 mg/dL — ABNORMAL HIGH (ref 70–99)
Glucose-Capillary: 123 mg/dL — ABNORMAL HIGH (ref 70–99)
Glucose-Capillary: 176 mg/dL — ABNORMAL HIGH (ref 70–99)

## 2014-04-12 MED ORDER — PRISMASOL BGK 4/2.5 32-4-2.5 MEQ/L IV SOLN
INTRAVENOUS | Status: DC
  Administered 2014-04-12 – 2014-04-16 (×7): via INTRAVENOUS_CENTRAL
  Filled 2014-04-12 (×16): qty 5000

## 2014-04-12 MED ORDER — PRISMASOL BGK 4/2.5 32-4-2.5 MEQ/L IV SOLN
INTRAVENOUS | Status: DC
  Administered 2014-04-12 – 2014-04-17 (×32): via INTRAVENOUS_CENTRAL
  Filled 2014-04-12 (×80): qty 5000

## 2014-04-12 MED ORDER — ALTEPLASE 100 MG IV SOLR
4.0000 mg | Freq: Once | INTRAVENOUS | Status: AC
  Administered 2014-04-12: 4 mg
  Filled 2014-04-12: qty 4

## 2014-04-12 MED ORDER — PRISMASOL BGK 4/2.5 32-4-2.5 MEQ/L IV SOLN
INTRAVENOUS | Status: DC
  Administered 2014-04-12 – 2014-04-17 (×5): via INTRAVENOUS_CENTRAL
  Filled 2014-04-12 (×9): qty 5000

## 2014-04-12 NOTE — Progress Notes (Signed)
[facility] ROUNDING NOTE   Subjective:   Interval History: tunneled catheter placed yesterday   Objective:  Vital signs in last 24 hours:  Temp:  [96.9 F (36.1 C)-100.8 F (38.2 C)] 96.9 F (36.1 C) (06/24 1117) Pulse Rate:  [69-132] 71 (06/24 1145) Resp:  [9-39] 19 (06/24 1215) BP: (53-133)/(23-116) 81/42 mmHg (06/24 1215) SpO2:  [98 %-100 %] 100 % (06/24 1145) Weight:  [56.3 kg (124 lb 1.9 oz)] 56.3 kg (124 lb 1.9 oz) (06/24 0400)  Weight change: 2.775 kg (6 lb 1.9 oz) Filed Weights   04/10/14 1800 04/11/14 0500 04/12/14 0400  Weight: 57.1 kg (125 lb 14.1 oz) 58.8 kg (129 lb 10.1 oz) 56.3 kg (124 lb 1.9 oz)    Intake/Output: I/O last 3 completed shifts: In: 3625.7 [I.V.:2567.7; Blood:850; IV Piggyback:208] Out: 1664 [Other:1664]   Intake/Output this shift:  Total I/O In: 303.1 [I.V.:303.1] Out: 308 [Other:308]  SVC syndrome  CVS- RRR  RS- CTA  ABD- BS present soft non-distended  EXT- no edema    Basic Metabolic Panel:  Recent Labs Lab 04/10/14 1602 04/11/14 0500 04/11/14 0955 04/11/14 1600 04/11/14 2130 04/12/14 0400  NA 149* 142  --  136* 139 137  K 5.3 6.5* 5.1 5.4* 5.0 5.2  CL 107 102  --  96 101 99  CO2 18* 20  --  22 23 22   GLUCOSE 177* 182*  --  205* 167* 177*  BUN 60* 41*  --  35* 32* 35*  CREATININE 13.24* 8.86*  --  7.97* 7.63* 8.49*  CALCIUM 8.7 8.4  --  8.7 8.3* 8.9  PHOS  --  4.9*  --  4.2  --  4.6    Liver Function Tests:  Recent Labs Lab 04/10/14 1602 04/11/14 0500 04/11/14 1600 04/12/14 0400  AST 15  --   --   --   ALT 11  --   --   --   ALKPHOS 97  --   --   --   BILITOT 0.3  --   --   --   PROT 5.3*  --   --   --   ALBUMIN 3.2* 3.8 3.3* 3.1*   No results found for this basename: LIPASE, AMYLASE,  in the last 168 hours No results found for this basename: AMMONIA,  in the last 168 hours  CBC:  Recent Labs Lab 04/10/14 1011 04/10/14 1440 04/10/14 1602 04/11/14 0500 04/12/14 0400  WBC  --   --   9.0 12.2* 8.4  HGB 11.9* 9.9* 6.8* 11.7* 8.0*  HCT 35.0* 29.0* 21.4* 34.7* 23.4*  MCV  --   --  97.3 89.7 88.6  PLT  --   --  177 75* 64*    Cardiac Enzymes:  Recent Labs Lab 04/10/14 1824 04/10/14 2330 04/11/14 0513  TROPONINI <0.30 <0.30 <0.30    BNP: No components found with this basename: POCBNP,   CBG:  Recent Labs Lab 04/11/14 1955 04/11/14 2352 04/12/14 0312 04/12/14 0724 04/12/14 1113  GLUCAP 151* 165* 174* 134* 136*    Microbiology: Results for orders placed during the hospital encounter of 04/10/14  MRSA PCR SCREENING     Status: None   Collection Time    04/11/14 12:56 AM      Result Value Ref Range Status   MRSA by PCR NEGATIVE  NEGATIVE Final   Comment:            The GeneXpert MRSA Assay (FDA     approved for  NASAL specimens     only), is one component of a     comprehensive MRSA colonization     surveillance program. It is not     intended to diagnose MRSA     infection nor to guide or     monitor treatment for     MRSA infections.    Coagulation Studies: No results found for this basename: LABPROT, INR,  in the last 72 hours  Urinalysis: No results found for this basename: COLORURINE, APPERANCEUR, LABSPEC, PHURINE, GLUCOSEU, HGBUR, BILIRUBINUR, KETONESUR, PROTEINUR, UROBILINOGEN, NITRITE, LEUKOCYTESUR,  in the last 72 hours    Imaging: Dg Chest Port 1 View  04/10/2014   CLINICAL DATA:  Renal failure, postoperative evaluation, fluid overload  EXAM: PORTABLE CHEST - 1 VIEW  COMPARISON:  None.  FINDINGS: Low lung volumes. Normal heart size and vascularity. Minor left base atelectasis is evident. Negative for edema or CHF. No pneumothorax. Trachea is midline. Atherosclerosis of the aorta. Gastric distention noted in the left upper quadrant.  IMPRESSION: Low volume exam with left basilar atelectasis.   Electronically Signed   By: Ruel Favorsrevor  Shick M.D.   On: 04/10/2014 17:05   Dg Abd Portable 1v  04/11/2014   CLINICAL DATA:  Femoral hemodialysis  catheter  EXAM: PORTABLE ABDOMEN - 1 VIEW  COMPARISON:  Portable exam 1759 hr compared to 12/14/2012  FINDINGS: Dual lumen RIGHT femoral dialysis catheter with tip projecting at L1-L2 disc space level.  LEFT femoral line tip projects over LEFT supra-acetabular region.  Visualized bowel gas pattern normal.  Minimally prominent stool in rectum.  Bones demineralized.  IMPRESSION: Tip of RIGHT femoral line projects at L1-L2 disc space.  Additional LEFT femoral line tip projecting over LEFT supra-acetabular region.   Electronically Signed   By: Ulyses SouthwardMark  Boles M.D.   On: 04/11/2014 18:09     Medications:   . sodium chloride 10 mL/hr at 04/11/14 2000  . norepinephrine (LEVOPHED) Adult infusion 6 mcg/min (04/12/14 1200)  . dialysis replacement fluid (prismasate) 300 mL/hr at 04/12/14 0604  . dialysis replacement fluid (prismasate) 200 mL/hr at 04/12/14 0604  . dialysate (PRISMASATE) 2,000 mL/hr at 04/12/14 0944  . vasopressin (PITRESSIN) infusion - *FOR SHOCK* 0.03 Units/min (04/12/14 1200)   . atorvastatin  20 mg Oral QHS  . cinacalcet  30 mg Oral BID WC  . clonazePAM  0.25 mg Oral BID  . hydrocortisone sodium succinate  50 mg Intravenous Q6H  . insulin aspart  0-15 Units Subcutaneous 6 times per day  . metoCLOPramide (REGLAN) injection  10 mg Intravenous 3 times per day  . midodrine  15 mg Oral TID  . multivitamin  1 tablet Oral QHS  . pantoprazole (PROTONIX) IV  40 mg Intravenous Q24H  . phenytoin (DILANTIN) IV  200 mg Intravenous QHS  . senna  1 tablet Oral BID  . sevelamer carbonate  800 mg Oral TID WC  . sodium chloride  10-40 mL Intracatheter Q12H   acetaminophen, acetaminophen, diphenhydrAMINE, fentaNYL, ondansetron, phenol, sodium chloride, sodium chloride  Assessment/ Plan:  ESRD- CRRT therapy  ANEMIA- Hb now  8.0 consider transfusion MBD- controlled  HTN/VOL-stable still hypotensive but chronic issue ACCESS- Very limited choices may be able to continue catheter  Hyperkalemia  resolved         LOS: 2 WEBB,MARTIN W @TODAY @1 :17 PM

## 2014-04-12 NOTE — Progress Notes (Signed)
Paged Dr. Darrick Pennaeterding - access would not allow treatment to continue.  Filter had clotted at 1530 and treatment was resumed at 1615.  Access would not allow the treatment to continue - able to flush and obtain blood return on both ports.  Decreased blood infusion rate to assist and repositioned pt.  Order obtained to TPA the access and resume treatment after.  Order placed.  IV Team notified to enable them to instill TPA.

## 2014-04-12 NOTE — Progress Notes (Signed)
PULMONARY / CRITICAL CARE MEDICINE  Name: [name] MRN: [id number] DOB: 1951-06-25    ADMISSION DATE:  04/10/2014 CONSULTATION DATE:  04/10/2014  REFERRING MD :  Darrick Penna PRIMARY SERVICE:  Vascular  CHIEF COMPLAINT:  Hypotension  BRIEF PATIENT DESCRIPTION: 63 yo baseline non verbal with ESRD on HD admitted for revision of thrombosed R AV graft. The procedure was complicated by injury to left superficial femoral artery requiring patch and significant blood loss.  Brought to PACU hypotensive, requiring vasopressors.   SIGNIFICANT EVENTS / STUDIES:  6/22  Revision of thrombosed AV graft complicated by arterial injury requiring patch, blood loss and hypotension  LINES / TUBES: R F HD cath 6/22 >>> 6/23 Tunneled R F HD cath 6/23 >>> L F TLC 6/23 >>>  CULTURES:  ANTIBIOTICS: Cefazolin 6/22 x 1  INTERVAL HISTORY:  IV access as above.  Refusing oral medications.  VITAL SIGNS: Temp:  [97.3 F (36.3 C)-100.8 F (38.2 C)] 97.3 F (36.3 C) (06/24 0727) Pulse Rate:  [85-133] 102 (06/24 0730) Resp:  [10-41] 31 (06/24 0730) BP: (74-133)/(39-116) 74/58 mmHg (06/24 0730) SpO2:  [97 %-100 %] 100 % (06/24 0730) Weight:  [56.3 kg (124 lb 1.9 oz)] 56.3 kg (124 lb 1.9 oz) (06/24 0400)  HEMODYNAMICS:   VENTILATOR SETTINGS:   INTAKE / OUTPUT: Intake/Output     06/23 0701 - 06/24 0700 06/24 0701 - 06/25 0700   I.V. (mL/kg) 1774.7 (31.5) 28.1 (0.5)   Blood     IV Piggyback 208    Total Intake(mL/kg) 1982.7 (35.2) 28.1 (0.5)   Emesis/NG output     Other 456 111   Blood     Total Output 456 111   Net +1526.7 -82.9          PHYSICAL EXAMINATION: General:  Resting comfortable Neuro:  Non verbal, does not follow commands HEENT:  PERRL Cardiovascular:  Regular, no murmurs Lungs:  Bilateral diminished air entry Abdomen:  Soft, nontender, bowel sounds diminished Musculoskeletal:  Moves all extremities Skin:  Intact  LABS: CBC  Recent Labs Lab 04/10/14 1602 04/11/14 0500  04/12/14 0400  WBC 9.0 12.2* 8.4  HGB 6.8* 11.7* 8.0*  HCT 21.4* 34.7* 23.4*  PLT 177 75* 64*   Coag's No results found for this basename: APTT, INR,  in the last 168 hours  BMET  Recent Labs Lab 04/11/14 1600 04/11/14 2130 04/12/14 0400  NA 136* 139 137  K 5.4* 5.0 5.2  CL 96 101 99  CO2 22 23 22   BUN 35* 32* 35*  CREATININE 7.97* 7.63* 8.49*  GLUCOSE 205* 167* 177*   Electrolytes  Recent Labs Lab 04/11/14 0500 04/11/14 1600 04/11/14 2130 04/12/14 0400  CALCIUM 8.4 8.7 8.3* 8.9  PHOS 4.9* 4.2  --  4.6   Sepsis Markers  Recent Labs Lab 04/10/14 1824 04/10/14 2300 04/11/14 0513  LATICACIDVEN 5.4* 4.7* 5.5*   ABG No results found for this basename: PHART, PCO2ART, PO2ART,  in the last 168 hours Liver Enzymes  Recent Labs Lab 04/10/14 1602 04/11/14 0500 04/11/14 1600 04/12/14 0400  AST 15  --   --   --   ALT 11  --   --   --   ALKPHOS 97  --   --   --   BILITOT 0.3  --   --   --   ALBUMIN 3.2* 3.8 3.3* 3.1*   Cardiac Enzymes  Recent Labs Lab 04/10/14 1824 04/10/14 2330 04/11/14 0513  TROPONINI <0.30 <0.30 <0.30   Glucose  Recent Labs Lab 04/11/14 0725 04/11/14 1129 04/11/14 1656 04/11/14 1955 04/11/14 2352 04/12/14 0312  GLUCAP 155* 174* 180* 151* 165* 174*   IMAGES: Dg Chest Port 1 View  04/10/2014   CLINICAL DATA:  Renal failure, postoperative evaluation, fluid overload  EXAM: PORTABLE CHEST - 1 VIEW  COMPARISON:  None.  FINDINGS: Low lung volumes. Normal heart size and vascularity. Minor left base atelectasis is evident. Negative for edema or CHF. No pneumothorax. Trachea is midline. Atherosclerosis of the aorta. Gastric distention noted in the left upper quadrant.  IMPRESSION: Low volume exam with left basilar atelectasis.   Electronically Signed   By: Ruel Favors M.D.   On: 04/10/2014 17:05   Dg Abd Portable 1v  04/11/2014   CLINICAL DATA:  Femoral hemodialysis catheter  EXAM: PORTABLE ABDOMEN - 1 VIEW  COMPARISON:  Portable  exam 1759 hr compared to 12/14/2012  FINDINGS: Dual lumen RIGHT femoral dialysis catheter with tip projecting at L1-L2 disc space level.  LEFT femoral line tip projects over LEFT supra-acetabular region.  Visualized bowel gas pattern normal.  Minimally prominent stool in rectum.  Bones demineralized.  IMPRESSION: Tip of RIGHT femoral line projects at L1-L2 disc space.  Additional LEFT femoral line tip projecting over LEFT supra-acetabular region.   Electronically Signed   By: Ulyses Southward M.D.   On: 04/11/2014 18:09   ASSESSMENT / PLAN:  PULMONARY A:   No active issues P:   Goal SpO2>92 Supplemental oxygen Incentive spirometry / flutter valve / pulmonary hygiene  CARDIOVASCULAR A:  Chronic hypotension Shock ( hemorrhagic / hypovolemic / vasodilatory from general anesthesia; doubt septic ) Occluded L groin AV graft s/p attempted repair, femoral artery injury and patch Occluded R groin AV graft Chronically occluded SVC Difficult IV access P:  Goal SBP>80 Levophed gtt, titrate to off Vasopressin gtt, off after Levophed d/c'd Midodrine Lipitor  RENAL A:   ESRD on HD Hyperkalemia P:   Renal consulted ( Dr. Hyman Hopes, by Dr. Darrick Penna, 6/22 ) Trend BMP CVVH Sensipar / Elizebeth Brooking   GASTROINTESTINAL A:   Nausea / vomiting post op  GERD on PPI P:   Diet when able Protonix Reglan scheduled Zofran PRM  HEMATOLOGIC A:  Acute blood loss anemia, resolved after transfusion Thrombocytopenia  VTE Px P:  Trend CBC HIT panel Avoid Heparin SCD  INFECTIOUS A:   Doubt acute infection P:   Monitor off abx  ENDOCRINE  A:  Adrenal insufficiency ( cortisol 9 09/15/2013 ) Hyperglycemia, likely steroid induced P:   Hydrocortisone 50 mg q6h SSI  NEUROLOGIC A:   Baseline nonverbal History of seizures, none recent Post op pain P:   Fentanyl PRN Dilantin Preadmission Klonopin bid  I have personally obtained history, examined patient, evaluated and interpreted laboratory and  imaging results, reviewed medical records, formulated assessment / plan and placed orders.  CRITICAL CARE:  The patient is critically ill with multiple organ systems failure and requires high complexity decision making for assessment and support, frequent evaluation and titration of therapies, application of advanced monitoring technologies and extensive interpretation of multiple databases. Critical Care Time devoted to patient care services described in this note is 35 minutes.   Lonia Farber, MD Pulmonary and Critical Care Medicine [facility] Pager: [phone]  04/12/2014, 8:31 AM

## 2014-04-12 NOTE — Progress Notes (Signed)
IV team unable to withdraw TPA from HD cath. Dr. Darrick Pennaeterding notified. Per MD order, we will leave TPA instilled overnight and will attempt to restart CRRT in the morning. Will continue to monitor pt.

## 2014-04-12 NOTE — Progress Notes (Addendum)
  [facility] Progress Note  04/12/2014 7:31 AM 1 Day Post-Op  Subjective:  Quiet-appears comfortable  Tm 100.8 now afebrile HR 90's-130's regular 80's-100's systolic 100% 2LO2NC  Gtts: Levophed Vasopressin  Filed Vitals:   04/12/14 0727  BP:   Pulse:   Temp: 97.3 F (36.3 C)  Resp:     Physical Exam: Incisions:  Left groin wound is c/d/i Extremities:  Cannot palpate pedal pulses  CBC    Component Value Date/Time   WBC 8.4 04/12/2014 0400   RBC 2.64* 04/12/2014 0400   HGB 8.0* 04/12/2014 0400   HCT 23.4* 04/12/2014 0400   PLT 64* 04/12/2014 0400   MCV 88.6 04/12/2014 0400   MCH 30.3 04/12/2014 0400   MCHC 34.2 04/12/2014 0400   RDW 16.1* 04/12/2014 0400   LYMPHSABS 1.1 10/02/2013 0305   MONOABS 0.2 10/02/2013 0305   EOSABS 0.0 10/02/2013 0305   BASOSABS 0.0 10/02/2013 0305    BMET    Component Value Date/Time   NA 137 04/12/2014 0400   K 5.2 04/12/2014 0400   CL 99 04/12/2014 0400   CO2 22 04/12/2014 0400   GLUCOSE 177* 04/12/2014 0400   BUN 35* 04/12/2014 0400   CREATININE 8.49* 04/12/2014 0400   CALCIUM 8.9 04/12/2014 0400   GFRNONAA 6* 04/12/2014 0400   GFRAA 7* 04/12/2014 0400    INR    Component Value Date/Time   INR 1.13 10/02/2013 0305     Intake/Output Summary (Last 24 hours) at 04/12/14 0731 Last data filed at 04/12/14 0645  Gross per 24 hour  Intake 1982.67 ml  Output    456 ml  Net 1526.67 ml     Assessment:  63 y.o. male is s/p:  #1 patch angioplasty left superficial femoral artery #2 thrombectomy left thigh graft #3 patch angioplasty left venous anastomosis #4 new interposition graft arterial limb 4 mm PTFE 2 Days Post-Op and #1: Placement of left femoral vein ultrasound-guided triple-lumen catheter  #2: Removal of hemodialysis catheter  #3: Placement of right femoral tunneled dialysis catheter  1 Day Post-Op  Plan: -diatek was not working well this am, but CRRT has been restarted this am.  Very difficult situation  as this is his last access site. -still requiring pressors    Doreatha MassedSamantha Rhyne, PA-C [facility] [phone] 04/12/2014 7:31 AM    Agree with above Doppler signal left DP  Groin incision healing Dialysis cath and triple lumen currently working Trying to wean pressors Care giver has been contacted to encourage pt to take PO  Fabienne Brunsharles Ibrahem Volkman, MD [facility] [place] Office: [phone] Pager: [phone]

## 2014-04-13 ENCOUNTER — Encounter (HOSPITAL_COMMUNITY): Payer: Self-pay | Admitting: Surgery

## 2014-04-13 LAB — RENAL FUNCTION PANEL
ALBUMIN: 2.9 g/dL — AB (ref 3.5–5.2)
ALBUMIN: 3 g/dL — AB (ref 3.5–5.2)
BUN: 33 mg/dL — AB (ref 6–23)
BUN: 23 mg/dL (ref 6–23)
CALCIUM: 8.7 mg/dL (ref 8.4–10.5)
CHLORIDE: 97 meq/L (ref 96–112)
CO2: 22 mEq/L (ref 19–32)
CO2: 22 mEq/L (ref 19–32)
Calcium: 9 mg/dL (ref 8.4–10.5)
Chloride: 99 mEq/L (ref 96–112)
Creatinine, Ser: 6.86 mg/dL — ABNORMAL HIGH (ref 0.50–1.35)
Creatinine, Ser: 4.67 mg/dL — ABNORMAL HIGH (ref 0.50–1.35)
GFR calc Af Amer: 9 mL/min — ABNORMAL LOW (ref >90)
GFR calc non Af Amer: 8 mL/min — ABNORMAL LOW (ref >90)
GFR calc non Af Amer: 12 mL/min — ABNORMAL LOW (ref >90)
GFR, EST AFRICAN AMERICAN: 14 mL/min — AB (ref >90)
GLUCOSE: 120 mg/dL — AB (ref 70–99)
Glucose, Bld: 146 mg/dL — ABNORMAL HIGH (ref 70–99)
PHOSPHORUS: 3.9 mg/dL (ref 2.3–4.6)
Phosphorus: 5.2 mg/dL — ABNORMAL HIGH (ref 2.3–4.6)
Potassium: 5.3 mEq/L (ref 3.7–5.3)
Potassium: 4.9 mEq/L (ref 3.7–5.3)
SODIUM: 136 meq/L — AB (ref 137–147)
Sodium: 134 mEq/L — ABNORMAL LOW (ref 137–147)

## 2014-04-13 LAB — GLUCOSE, CAPILLARY
GLUCOSE-CAPILLARY: 108 mg/dL — AB (ref 70–99)
GLUCOSE-CAPILLARY: 125 mg/dL — AB (ref 70–99)
GLUCOSE-CAPILLARY: 157 mg/dL — AB (ref 70–99)
Glucose-Capillary: 114 mg/dL — ABNORMAL HIGH (ref 70–99)
Glucose-Capillary: 141 mg/dL — ABNORMAL HIGH (ref 70–99)
Glucose-Capillary: 132 mg/dL — ABNORMAL HIGH (ref 70–99)
Glucose-Capillary: 90 mg/dL (ref 70–99)

## 2014-04-13 LAB — CBC
HCT: 17.9 % — ABNORMAL LOW (ref 39.0–52.0)
HEMOGLOBIN: 6 g/dL — AB (ref 13.0–17.0)
MCH: 30.5 pg (ref 26.0–34.0)
MCHC: 33.5 g/dL (ref 30.0–36.0)
MCV: 90.9 fL (ref 78.0–100.0)
PLATELETS: 60 10*3/uL — AB (ref 150–400)
RBC: 1.97 MIL/uL — ABNORMAL LOW (ref 4.22–5.81)
RDW: 15.8 % — ABNORMAL HIGH (ref 11.5–15.5)
WBC: 7.1 10*3/uL (ref 4.0–10.5)

## 2014-04-13 LAB — PREPARE RBC (CROSSMATCH)

## 2014-04-13 LAB — HEMOGLOBIN AND HEMATOCRIT, BLOOD
HCT: 22.6 % — ABNORMAL LOW (ref 39.0–52.0)
HEMOGLOBIN: 7.7 g/dL — AB (ref 13.0–17.0)

## 2014-04-13 MED ORDER — NEPRO/CARBSTEADY PO LIQD
237.0000 mL | Freq: Two times a day (BID) | ORAL | Status: DC
  Administered 2014-04-14 – 2014-04-27 (×11): 237 mL via ORAL
  Filled 2014-04-13 (×21): qty 237

## 2014-04-13 NOTE — Progress Notes (Signed)
eLink Physician-Brief Progress Note Patient Name: [name] DOB: May 24, 1951 MRN: [id number]  Date of Service  04/13/2014   HPI/Events of Note   Hb 6.  eICU Interventions  Will transfuse 1 unit PRBC.   Intervention Category Major Interventions: Other:  Fraser Busche 04/13/2014, 5:37 AM

## 2014-04-13 NOTE — Plan of Care (Signed)
Problem: Phase II Progression Outcomes Goal: Tolerates diet Outcome: Not Progressing Pt refuses

## 2014-04-13 NOTE — Progress Notes (Signed)
Called and spoke with pts sister Mrs Arvilla MarketMills, arranged for meeting with CCM at 1pm. Also spoke with CCM regarding placement of NG tube. Pt refuses to take any PO intake and not cooperative with taking meds. MD deferred placement of NG until after the discussion with sister tomorrow.

## 2014-04-13 NOTE — Plan of Care (Signed)
Problem: Phase II Progression Outcomes Goal: Ambulates up to 600 ft. in hall x 1 Outcome: Not Progressing Bedrest

## 2014-04-13 NOTE — Progress Notes (Addendum)
INITIAL NUTRITION ASSESSMENT  DOCUMENTATION CODES Per approved criteria  -Not Applicable   INTERVENTION: Nepro Shake po BID, each supplement provides 425 kcal and 19 grams protein RD to follow for nutrition care plan  NUTRITION DIAGNOSIS: Inadequate oral intake related to refusal to eat as evidenced by chart review  Goal: Pt to meet >/= 90% of their estimated nutrition needs   Monitor:  TF initiaiton, PO intake, goals of care, weight, labs, I/O's  Reason for Assessment: Consult  63 y.o. male  Admitting Dx: hypotension   ASSESSMENT: [name] baseline non-verbal with ESRD on HD admitted for revision of thrombosed R AV graft. The procedure was complicated by injury to left superficial femoral artery requiring patch and significant blood loss. Brought to PACU hypotensive, requiring vasopressors.  Patient s/p procedure 6/23: #1  Placement of left femoral vein ultrasound-guided triple-lumen catheter  #2  Removal of hemodialysis catheter  #3  Placement of right femoral tunneled dialysis catheter  Patient currently on Full Liquids, however, refusing to eat. RD consulted for TF initiation & management. Spoke with RN -- plan is to hold off on feeding tube placement.  Revisit care plan tomorrow with family.  RD unable to obtain nutrition hx or complete Nutrition Focused Physical Exam at this time.  Height: Ht Readings from Last 1 Encounters:  04/10/14 5' (1.524 m)    Weight: Wt Readings from Last 1 Encounters:  04/13/14 128 lb 15.5 oz (58.5 kg)    Ideal Body Weight: 106 lb  % Ideal Body Weight: 120%  Wt Readings from Last 10 Encounters:  04/13/14 128 lb 15.5 oz (58.5 kg)  04/13/14 128 lb 15.5 oz (58.5 kg)  04/13/14 128 lb 15.5 oz (58.5 kg)  03/01/14 118 lb (53.524 kg)  01/25/14 115 lb 6 oz (52.334 kg)  01/25/14 115 lb 6 oz (52.334 kg)  12/28/13 114 lb 3 oz (51.795 kg)  12/28/13 114 lb 3 oz (51.795 kg)  10/19/13 116 lb (52.617 kg)  10/19/13 116 lb (52.617 kg)     Usual Body Weight: 118 lb  % Usual Body Weight: 108%  BMI:  Body mass index is 25.19 kg/(m^2).  Estimated Nutritional Needs: Kcal: 1700-1900 Protein: 80-90 gm Fluid: 1.7-1.9 L  Skin: healed wound to coccyx  Diet Order: Full Liquid  EDUCATION NEEDS: -No education needs identified at this time   Intake/Output Summary (Last 24 hours) at 04/13/14 1033 Last data filed at 04/13/14 1000  Gross per 24 hour  Intake 1352.66 ml  Output    268 ml  Net 1084.66 ml    Labs:   Recent Labs Lab 04/12/14 0400 04/12/14 1550 04/13/14 0445  NA 137 135* 134*  K 5.2 5.1 5.3  CL 99 98 97  CO2 22 24 22   BUN 35* 28* 33*  CREATININE 8.49* 6.00* 6.86*  CALCIUM 8.9 8.7 9.0  PHOS 4.6 4.3 5.2*  GLUCOSE 177* 147* 146*    CBG (last 3)   Recent Labs  04/12/14 2314 04/13/14 0318 04/13/14 0733  GLUCAP 176* 141* 132*    Scheduled Meds: . atorvastatin  20 mg Oral QHS  . cinacalcet  30 mg Oral BID WC  . clonazePAM  0.25 mg Oral BID  . hydrocortisone sodium succinate  50 mg Intravenous Q6H  . insulin aspart  0-15 Units Subcutaneous 6 times per day  . metoCLOPramide (REGLAN) injection  10 mg Intravenous 3 times per day  . midodrine  15 mg Oral TID  . multivitamin  1 tablet Oral QHS  .  pantoprazole (PROTONIX) IV  40 mg Intravenous Q24H  . phenytoin (DILANTIN) IV  200 mg Intravenous QHS  . senna  1 tablet Oral BID  . sevelamer carbonate  800 mg Oral TID WC  . sodium chloride  10-40 mL Intracatheter Q12H    Continuous Infusions: . sodium chloride 10 mL/hr at 04/11/14 2000  . norepinephrine (LEVOPHED) Adult infusion 4 mcg/min (04/13/14 0900)  . dialysis replacement fluid (prismasate) 300 mL/hr at 04/12/14 0604  . dialysis replacement fluid (prismasate) 200 mL/hr at 04/12/14 0604  . dialysate (PRISMASATE) 2,000 mL/hr at 04/13/14 1028  . vasopressin (PITRESSIN) infusion - *FOR SHOCK* 0.03 Units/min (04/13/14 0900)    Past Medical History  Diagnosis Date  . Heart murmur,  systolic 08/10/2009  . Syncope 05/07/2009  . 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 Esophageal varices 11/07/2008  . Gastric ulcer 10/09    antral, with h pylori positive  . Congestive heart failure 03/06/2008  . Cellulitis and abscess of leg, except foot 03/06/2008  . Secondary hyperparathyroidism 02/02/2008  . [place] 02/02/2008  . Hyperlipidemia 02/02/2008  . Anemia 02/02/2008  . ESRD (end stage renal disease)     TTS hemodialysis  . GERD (gastroesophageal reflux disease)   . Hypertension 02/02/2008    in history  . Mental retardation 02/02/2008  . M[place]   . Seizures     "non in a while at home". none in past year .  Marland Kitchen. Complication of anesthesia     in the past BP has dropped     Past Surgical History  Procedure Laterality Date  . Left forearm graft      for HD  . Arteriovenous graft placement  11/22/10    Right thigh AVG  . Thrombectomy and revision of arterioventous (av) goretex  graft    . Thrombectomy and revision of arterioventous (av) goretex  graft  10/22/2012    Procedure: THROMBECTOMY AND REVISION OF ARTERIOVENTOUS (AV) GORETEX  GRAFT;  Surgeon: Larina Earthlyodd F Early, MD;  [facility];  Service: Vascular;  Laterality: Right;  . Thrombectomy w/ embolectomy  11/10/2012    Procedure: THROMBECTOMY ARTERIOVENOUS GORE-TEX GRAFT;  Surgeon: Pryor OchoaJames D Lawson, MD;  [facility];  Service: Vascular;  Laterality: Right;  . Thrombectomy and revision of arterioventous (av) goretex  graft Right 12/08/2012    Procedure: THROMBECTOMY AND REVISION OF ARTERIOVENTOUS (AV) GORETEX  GRAFT right thigh;  Surgeon: Sherren Kernsharles E Fields, MD;  [facility];  Service: Vascular;  Laterality: Right;  Susie Cassette. Venogram N/A 12/08/2012    Procedure: VENOGRAM;  Surgeon: Sherren Kernsharles E Fields, MD;  [facility];  Service: Vascular;  Laterality: N/A;  Intraoperative Central venogram  . Thrombectomy w/ embolectomy Right 12/12/2012    Procedure: THROMBECTOMY ARTERIOVENOUS GORE-TEX GRAFT;  Surgeon: Nada LibmanVance W Brabham, MD;   [facility];  Service: Vascular;  Laterality: Right;  . Insertion of dialysis catheter Left 12/14/2012    Procedure: INSERTION OF DIALYSIS CATHETER;  Surgeon: Nada LibmanVance W Brabham, MD;  [facility];  Service: Vascular;  Laterality: Left;  . Insertion of dialysis catheter Right 01/13/2013    Procedure: INSERTION OF DIALYSIS CATHETER;  Surgeon: Nada LibmanVance W Brabham, MD;  [facility];  Service: Vascular;  Laterality: Right;  . Removal of a dialysis catheter Left 01/13/2013    Procedure: REMOVAL OF A DIALYSIS CATHETER;  Surgeon: Nada LibmanVance W Brabham, MD;  [facility]: [facility] OR;  Service: Vascular;  Laterality: Left;  . Av fistula placement Left 02/11/2013    Procedure: INSERTION OF ARTERIOVENOUS (  AV) GORE-TEX GRAFT THIGH;  Surgeon: Nada Libman, MD;  Location: [facility] OR;  Service: Vascular;  Laterality: Left;  using 6mm x 50cm Gore-Tex Vascular Graft  . Esophagogastroduodenoscopy N/A 02/16/2013    Procedure: ESOPHAGOGASTRODUODENOSCOPY (EGD);  Surgeon: Vertell Novak., MD;  Location: [facility] ENDOSCOPY;  Service: Endoscopy;  Laterality: N/A;  bedside  . Esophagogastroduodenoscopy N/A 09/14/2013    Procedure: ESOPHAGOGASTRODUODENOSCOPY (EGD);  Surgeon: Vertell Novak., MD;  Location[facility] ENDOSCOPY;  Service: Endoscopy;  Laterality: N/A;  control of bleeding if needed  . Esophagogastroduodenoscopy N/A 10/03/2013    Procedure: ESOPHAGOGASTRODUODENOSCOPY (EGD);  Surgeon: Theda Belfast, MD;  Location: [facility] ENDOSCOPY;  Service: Endoscopy;  Laterality: N/A;  Bedside  . Radiology with anesthesia Right 10/19/2013    Procedure: RADIOLOGY WITH ANESTHESIA;  Surgeon: Malachy Moan, MD;  Location: [facility] OR;  Service: Radiology;  Laterality: Right;  . Radiology with anesthesia Left 12/28/2013    Procedure: RADIOLOGY WITH ANESTHESIA;  Surgeon: Reola Calkins, MD;  Location: [facility] OR;  Service: Radiology;  Laterality: Left;  . Thrombectomy and revision of arterioventous (av) goretex  graft Left 01/25/2014    Procedure: THROMBECTOMY AND  REVISION OF LEFT THIGH ARTERIOVENTOUS (AV) GORETEX  GRAFT WITH PATCH ANGIOPLASTY;  Surgeon: Pryor Ochoa, MD;  Location: [facility] OR;  Service: Vascular;  Laterality: Left;  . Thrombectomy and revision of arterioventous (av) goretex  graft Left 04/10/2014    Procedure: THROMBECTOMY AND REVISION OF ARTERIOVENTOUS (AV) GORETEX  GRAFT;  Surgeon: Sherren Kerns, MD;  Location: [facility] OR;  Service: Vascular;  Laterality: Left;  . Patch angioplasty Left 04/10/2014    Procedure: PATCH ANGIOPLASTY LEFT SFA;  Surgeon: Sherren Kerns, MD;  Location: [facility] OR;  Service: Vascular;  Laterality: Left;    Maureen Chatters, RD, LDN Pager #: [phone] After-Hours Pager #: [phone]

## 2014-04-13 NOTE — Progress Notes (Signed)
PULMONARY / CRITICAL CARE MEDICINE  Name: [name] MRN: [id number] DOB: 21-May-1951    ADMISSION DATE:  04/10/2014 CONSULTATION DATE:  04/10/2014  REFERRING MD :  Darrick PennaFields PRIMARY SERVICE:  Vascular  CHIEF COMPLAINT:  Hypotension  BRIEF PATIENT DESCRIPTION: 63 yo baseline non verbal with ESRD on HD admitted for revision of thrombosed R AV graft. The procedure was complicated by injury to left superficial femoral artery requiring patch and significant blood loss.  Brought to PACU hypotensive, requiring vasopressors.   SIGNIFICANT EVENTS / STUDIES:  6/22  Revision of thrombosed AV graft complicated by arterial injury requiring patch, blood loss and hypotension  LINES / TUBES: R F HD cath 6/22 >>> 6/23 Tunneled R F HD cath 6/23 >>> L F TLC 6/23 >>>  CULTURES:  ANTIBIOTICS: Cefazolin 6/22 x 1  INTERVAL HISTORY:  Anemic, Hb 6, transfuse 1 u PRBC  VITAL SIGNS: Temp:  [96.9 F (36.1 C)-98 F (36.7 C)] 97.7 F (36.5 C) (06/25 0825) Pulse Rate:  [41-101] 84 (06/25 0825) Resp:  [9-22] 12 (06/25 0825) BP: (50-137)/(26-122) 100/51 mmHg (06/25 0825) SpO2:  [82 %-100 %] 100 % (06/25 0825) Weight:  [58.5 kg (128 lb 15.5 oz)] 58.5 kg (128 lb 15.5 oz) (06/25 0423)  HEMODYNAMICS:   VENTILATOR SETTINGS:   INTAKE / OUTPUT: Intake/Output     06/24 0701 - 06/25 0700 06/25 0701 - 06/26 0700   P.O. 120    I.V. (mL/kg) 910.8 (15.6)    Blood 12.5 300   IV Piggyback 104    Total Intake(mL/kg) 1147.3 (19.6) 300 (5.1)   Other 384    Total Output 384     Net +763.3 +300        Urine Occurrence       PHYSICAL EXAMINATION: General:  Resting comfortable Neuro:  Does not follow commands, non verbal HEENT:  PERRL Cardiovascular:  Regular, no murmurs Lungs:  CTAB Abdomen:  Soft, nontender, bowel sounds diminished Musculoskeletal:  Moves all extremities Skin:  Intact  LABS: CBC  Recent Labs Lab 04/11/14 0500 04/12/14 0400 04/13/14 0443  WBC 12.2* 8.4 7.1  HGB 11.7* 8.0* 6.0*   HCT 34.7* 23.4* 17.9*  PLT 75* 64* 60*   Coag's No results found for this basename: APTT, INR,  in the last 168 hours  BMET  Recent Labs Lab 04/12/14 0400 04/12/14 1550 04/13/14 0445  NA 137 135* 134*  K 5.2 5.1 5.3  CL 99 98 97  CO2 22 24 22   BUN 35* 28* 33*  CREATININE 8.49* 6.00* 6.86*  GLUCOSE 177* 147* 146*   Electrolytes  Recent Labs Lab 04/12/14 0400 04/12/14 1550 04/13/14 0445  CALCIUM 8.9 8.7 9.0  PHOS 4.6 4.3 5.2*   Sepsis Markers  Recent Labs Lab 04/10/14 1824 04/10/14 2300 04/11/14 0513  LATICACIDVEN 5.4* 4.7* 5.5*   ABG No results found for this basename: PHART, PCO2ART, PO2ART,  in the last 168 hours Liver Enzymes  Recent Labs Lab 04/10/14 1602  04/12/14 0400 04/12/14 1550 04/13/14 0445  AST 15  --   --   --   --   ALT 11  --   --   --   --   ALKPHOS 97  --   --   --   --   BILITOT 0.3  --   --   --   --   ALBUMIN 3.2*  < > 3.1* 2.9* 2.9*  < > = values in this interval not displayed. Cardiac Enzymes  Recent Labs Lab  04/10/14 1824 04/10/14 2330 04/11/14 0513  TROPONINI <0.30 <0.30 <0.30   Glucose  Recent Labs Lab 04/12/14 0724 04/12/14 1113 04/12/14 1520 04/12/14 1912 04/12/14 2314 04/13/14 0318  GLUCAP 134* 136* 123* 170* 176* 141*   IMAGES: Dg Abd Portable 1v  04/11/2014   CLINICAL DATA:  Femoral hemodialysis catheter  EXAM: PORTABLE ABDOMEN - 1 VIEW  COMPARISON:  Portable exam 1759 hr compared to 12/14/2012  FINDINGS: Dual lumen RIGHT femoral dialysis catheter with tip projecting at L1-L2 disc space level.  LEFT femoral line tip projects over LEFT supra-acetabular region.  Visualized bowel gas pattern normal.  Minimally prominent stool in rectum.  Bones demineralized.  IMPRESSION: Tip of RIGHT femoral line projects at L1-L2 disc space.  Additional LEFT femoral line tip projecting over LEFT supra-acetabular region.   Electronically Signed   By: Ulyses SouthwardMark  Boles M.D.   On: 04/11/2014 18:09   ASSESSMENT /  PLAN:  PULMONARY A:   No active issues P:   Goal SpO2>92 Supplemental oxygen Incentive spirometry / flutter valve / pulmonary hygiene  CARDIOVASCULAR A:  Chronic hypotension Shock ( hemorrhagic / hypovolemic / vasodilatory from general anesthesia; doubt septic ) Occluded L groin AV graft s/p attempted repair, femoral artery injury and patch Occluded R groin AV graft Chronically occluded SVC Difficult IV access P:  Goal SBP>80 Levophed gtt / Vasopressin gtt Midodrine Lipitor  RENAL A:   ESRD on HD Hyperkalemia P:   Renal consulted ( Dr. Hyman HopesWebb, by Dr. Darrick PennaFields, 6/22 ) Trend BMP CVVH Sensipar / Elizebeth Brookingenvela May need revision of HD access as high resistance    GASTROINTESTINAL A:   Nausea / vomiting post op, resolved GERD on PPI P:   Protonix Reglan scheduled Zofran PRM Place feeding tube  TF per Nutritionist  HEMATOLOGIC A:  Acute blood loss anemia Thrombocytopenia  VTE Px P:  Trend CBC HIT panel Avoid Heparin SCD Transfuse 1 unit of PRBC  INFECTIOUS A:   Doubt acute infection P:   Monitor off abx  ENDOCRINE  A:  Adrenal insufficiency ( cortisol 9 09/15/2013 ) Hyperglycemia, likely steroid induced P:   Hydrocortisone 50 mg q6h SSI  NEUROLOGIC A:   Baseline nonverbal History of seizures, none recent Post op pain P:   Fentanyl PRN Dilantin Preadmission Klonopin bid  I have personally obtained history, examined patient, evaluated and interpreted laboratory and imaging results, reviewed medical records, formulated assessment / plan and placed orders.  CRITICAL CARE:  The patient is critically ill with multiple organ systems failure and requires high complexity decision making for assessment and support, frequent evaluation and titration of therapies, application of advanced monitoring technologies and extensive interpretation of multiple databases. Critical Care Time devoted to patient care services described in this note is 35 minutes.    Lonia FarberZUBELEVITSKIY, KONSTANTIN, MD Pulmonary and Critical Care Medicine [facility] Pager: [phone]  04/13/2014, 8:38 AM

## 2014-04-13 NOTE — Progress Notes (Signed)
[facility]  Subjective  - still refusing to eat   Objective 95/46 71 97.3 F (36.3 C) (Oral) 16 100%  Intake/Output Summary (Last 24 hours) at 04/13/14 [id number] Last data filed at 04/13/14 0700  Gross per 24 hour  Intake 1055.16 ml  Output    273 ml  Net 782.16 ml   Still on levophed vasopressin Left and right groins clean no erythema or drainage Doppler signals in feet Abdomen soft  Assessment/Planning: Anemia unknown etiology no hematoma no evidence of GI bleed ? Renal related receiving transfusion today Right femoral catheter function marginal If patient continues to refuse PO intake will need to consider feeding tube versus palliative care consult. Would defer above decision to renal service   FIELDS,CHARLES E 04/13/2014 8:24 AM --  Laboratory Lab Results:  Recent Labs  04/12/14 0400 04/13/14 0443  WBC 8.4 7.1  HGB 8.0* 6.0*  HCT 23.4* 17.9*  PLT 64* 60*   BMET  Recent Labs  04/12/14 1550 04/13/14 0445  NA 135* 134*  K 5.1 5.3  CL 98 97  CO2 24 22  GLUCOSE 147* 146*  BUN 28* 33*  CREATININE 6.00* 6.86*  CALCIUM 8.7 9.0    COAG Lab Results  Component Value Date   INR 1.13 10/02/2013   INR 1.00 09/14/2013   INR 1.47 02/13/2013   No results found for this basename: PTT

## 2014-04-13 NOTE — Plan of Care (Signed)
Problem: Phase II Progression Outcomes Goal: Hemodynamically stable Outcome: Not Progressing Requiring levo and vasopressin.

## 2014-04-13 NOTE — Progress Notes (Signed)
[facility] ROUNDING NOTE   Subjective:   Interval History: refusing to eat appears to have poorly functioning catheter   Objective:  Vital signs in last 24 hours:  Temp:  [96.4 F (35.8 C)-98 F (36.7 C)] 96.4 F (35.8 C) (06/25 1130) Pulse Rate:  [41-101] 78 (06/25 1215) Resp:  [9-22] 12 (06/25 1215) BP: (50-137)/(26-122) 99/53 mmHg (06/25 1215) SpO2:  [82 %-100 %] 100 % (06/25 1215) Weight:  [58.5 kg (128 lb 15.5 oz)] 58.5 kg (128 lb 15.5 oz) (06/25 0423)  Weight change: 2.2 kg (4 lb 13.6 oz) Filed Weights   04/11/14 0500 04/12/14 0400 04/13/14 0423  Weight: 58.8 kg (129 lb 10.1 oz) 56.3 kg (124 lb 1.9 oz) 58.5 kg (128 lb 15.5 oz)    Intake/Output: I/O last 3 completed shifts: In: 2243.4 [P.O.:120; I.V.:1798.9; Blood:12.5; IV Piggyback:312] Out: 456 [Other:456]   Intake/Output this shift:  Total I/O In: 520 [I.V.:220; Blood:300] Out: 149 [Other:149]  SVC syndrome  CVS- RRR  RS- CTA  ABD- BS present soft non-distended  EXT- no edema    Basic Metabolic Panel:  Recent Labs Lab 04/11/14 0500  04/11/14 1600 04/11/14 2130 04/12/14 0400 04/12/14 1550 04/13/14 0445  NA 142  --  136* 139 137 135* 134*  K 6.5*  < > 5.4* 5.0 5.2 5.1 5.3  CL 102  --  96 101 99 98 97  CO2 20  --  22 23 22 24 22   GLUCOSE 182*  --  205* 167* 177* 147* 146*  BUN 41*  --  35* 32* 35* 28* 33*  CREATININE 8.86*  --  7.97* 7.63* 8.49* 6.00* 6.86*  CALCIUM 8.4  --  8.7 8.3* 8.9 8.7 9.0  PHOS 4.9*  --  4.2  --  4.6 4.3 5.2*  < > = values in this interval not displayed.  Liver Function Tests:  Recent Labs Lab 04/10/14 1602 04/11/14 0500 04/11/14 1600 04/12/14 0400 04/12/14 1550 04/13/14 0445  AST 15  --   --   --   --   --   ALT 11  --   --   --   --   --   ALKPHOS 97  --   --   --   --   --   BILITOT 0.3  --   --   --   --   --   PROT 5.3*  --   --   --   --   --   ALBUMIN 3.2* 3.8 3.3* 3.1* 2.9* 2.9*   No results found for this basename: LIPASE, AMYLASE,   in the last 168 hours No results found for this basename: AMMONIA,  in the last 168 hours  CBC:  Recent Labs Lab 04/10/14 1602 04/11/14 0500 04/12/14 0400 04/13/14 0443 04/13/14 1115  WBC 9.0 12.2* 8.4 7.1  --   HGB 6.8* 11.7* 8.0* 6.0* 7.7*  HCT 21.4* 34.7* 23.4* 17.9* 22.6*  MCV 97.3 89.7 88.6 90.9  --   PLT 177 75* 64* 60*  --     Cardiac Enzymes:  Recent Labs Lab 04/10/14 1824 04/10/14 2330 04/11/14 0513  TROPONINI <0.30 <0.30 <0.30    BNP: No components found with this basename: POCBNP,   CBG:  Recent Labs Lab 04/12/14 1912 04/12/14 2314 04/13/14 0318 04/13/14 0733 04/13/14 1127  GLUCAP 170* 176* 141* 132* 108*    Microbiology: Results for orders placed during the hospital encounter of 04/10/14  MRSA PCR SCREENING     Status:  None   Collection Time    04/11/14 12:56 AM      Result Value Ref Range Status   MRSA by PCR NEGATIVE  NEGATIVE Final   Comment:            The GeneXpert MRSA Assay (FDA     approved for NASAL specimens     only), is one component of a     comprehensive MRSA colonization     surveillance program. It is not     intended to diagnose MRSA     infection nor to guide or     monitor treatment for     MRSA infections.    Coagulation Studies: No results found for this basename: LABPROT, INR,  in the last 72 hours  Urinalysis: No results found for this basename: COLORURINE, APPERANCEUR, LABSPEC, PHURINE, GLUCOSEU, HGBUR, BILIRUBINUR, KETONESUR, PROTEINUR, UROBILINOGEN, NITRITE, LEUKOCYTESUR,  in the last 72 hours    Imaging: Dg Abd Portable 1v  04/11/2014   CLINICAL DATA:  Femoral hemodialysis catheter  EXAM: PORTABLE ABDOMEN - 1 VIEW  COMPARISON:  Portable exam 1759 hr compared to 12/14/2012  FINDINGS: Dual lumen RIGHT femoral dialysis catheter with tip projecting at L1-L2 disc space level.  LEFT femoral line tip projects over LEFT supra-acetabular region.  Visualized bowel gas pattern normal.  Minimally prominent stool in  rectum.  Bones demineralized.  IMPRESSION: Tip of RIGHT femoral line projects at L1-L2 disc space.  Additional LEFT femoral line tip projecting over LEFT supra-acetabular region.   Electronically Signed   By: Ulyses SouthwardMark  Boles M.D.   On: 04/11/2014 18:09     Medications:   . sodium chloride 10 mL/hr at 04/11/14 2000  . norepinephrine (LEVOPHED) Adult infusion 3 mcg/min (04/13/14 1200)  . dialysis replacement fluid (prismasate) 300 mL/hr at 04/12/14 0604  . dialysis replacement fluid (prismasate) 200 mL/hr at 04/12/14 0604  . dialysate (PRISMASATE) 2,000 mL/hr at 04/13/14 1028  . vasopressin (PITRESSIN) infusion - *FOR SHOCK* 0.03 Units/min (04/13/14 1200)   . atorvastatin  20 mg Oral QHS  . cinacalcet  30 mg Oral BID WC  . clonazePAM  0.25 mg Oral BID  . hydrocortisone sodium succinate  50 mg Intravenous Q6H  . insulin aspart  0-15 Units Subcutaneous 6 times per day  . metoCLOPramide (REGLAN) injection  10 mg Intravenous 3 times per day  . midodrine  15 mg Oral TID  . multivitamin  1 tablet Oral QHS  . pantoprazole (PROTONIX) IV  40 mg Intravenous Q24H  . phenytoin (DILANTIN) IV  200 mg Intravenous QHS  . senna  1 tablet Oral BID  . sevelamer carbonate  800 mg Oral TID WC  . sodium chloride  10-40 mL Intracatheter Q12H   acetaminophen, acetaminophen, diphenhydrAMINE, fentaNYL, ondansetron, phenol, sodium chloride, sodium chloride  Assessment/ Plan:    Assessment/ Plan:   ESRD- CRRT therapy  ANEMIA- Hb 7.7 MBD- controlled  HTN/VOL-stable still hypotensive  ACCESS-  Poor functioning catheter no further options OTHER- hyperkalemia will remove K from replacement  k 5.3      LOS: 3 Mumtaz Lovins W @TODAY @12 :27 PM

## 2014-04-13 NOTE — Plan of Care (Signed)
Problem: Phase II Progression Outcomes Goal: Barriers To Progression Addressed/Resolved Outcome: Not Progressing -CRRT stopped d/t catheter clotting. -Difficult to assess pt needs and determine understanding of staff requests.

## 2014-04-13 NOTE — Progress Notes (Signed)
CRITICAL VALUE ALERT  Critical value received:  Hgb 6.0  Date of notification:  04/13/14  Time of notification:  0535  Critical value read back:yes  Nurse who received alert:  Okey DupreJennifer Parrish, RN  elink called   Responding MD:  Dr. Craige CottaSood  Time MD responded:  [phone]

## 2014-04-14 DIAGNOSIS — Z515 Encounter for palliative care: Secondary | ICD-10-CM

## 2014-04-14 DIAGNOSIS — Z5189 Encounter for other specified aftercare: Secondary | ICD-10-CM

## 2014-04-14 LAB — TYPE AND SCREEN
ABO/RH(D): B POS
Antibody Screen: POSITIVE
DAT, IgG: NEGATIVE
UNIT DIVISION: 0
UNIT DIVISION: 0
Unit division: 0

## 2014-04-14 LAB — GLUCOSE, CAPILLARY
GLUCOSE-CAPILLARY: 99 mg/dL (ref 70–99)
GLUCOSE-CAPILLARY: 123 mg/dL — AB (ref 70–99)
GLUCOSE-CAPILLARY: 139 mg/dL — AB (ref 70–99)
Glucose-Capillary: 93 mg/dL (ref 70–99)
Glucose-Capillary: 130 mg/dL — ABNORMAL HIGH (ref 70–99)

## 2014-04-14 LAB — RENAL FUNCTION PANEL
ALBUMIN: 3.2 g/dL — AB (ref 3.5–5.2)
Albumin: 3.2 g/dL — ABNORMAL LOW (ref 3.5–5.2)
BUN: 16 mg/dL (ref 6–23)
BUN: 12 mg/dL (ref 6–23)
CALCIUM: 8.1 mg/dL — AB (ref 8.4–10.5)
CHLORIDE: 96 meq/L (ref 96–112)
CO2: 21 meq/L (ref 19–32)
CO2: 26 mEq/L (ref 19–32)
CREATININE: 3.48 mg/dL — AB (ref 0.50–1.35)
Calcium: 8.7 mg/dL (ref 8.4–10.5)
Chloride: 99 mEq/L (ref 96–112)
Creatinine, Ser: 2.24 mg/dL — ABNORMAL HIGH (ref 0.50–1.35)
GFR calc Af Amer: 20 mL/min — ABNORMAL LOW (ref >90)
GFR calc Af Amer: 34 mL/min — ABNORMAL LOW (ref >90)
GFR calc non Af Amer: 29 mL/min — ABNORMAL LOW (ref >90)
GFR, EST NON AFRICAN AMERICAN: 17 mL/min — AB (ref >90)
GLUCOSE: 140 mg/dL — AB (ref 70–99)
Glucose, Bld: 112 mg/dL — ABNORMAL HIGH (ref 70–99)
PHOSPHORUS: 2.9 mg/dL (ref 2.3–4.6)
POTASSIUM: 4.5 meq/L (ref 3.7–5.3)
POTASSIUM: 4.8 meq/L (ref 3.7–5.3)
Phosphorus: 3.4 mg/dL (ref 2.3–4.6)
Sodium: 136 mEq/L — ABNORMAL LOW (ref 137–147)
Sodium: 137 mEq/L (ref 137–147)

## 2014-04-14 LAB — CBC
HCT: 21.9 % — ABNORMAL LOW (ref 39.0–52.0)
HEMOGLOBIN: 7.3 g/dL — AB (ref 13.0–17.0)
MCH: 30.2 pg (ref 26.0–34.0)
MCHC: 33.3 g/dL (ref 30.0–36.0)
MCV: 90.5 fL (ref 78.0–100.0)
PLATELETS: 43 10*3/uL — AB (ref 150–400)
RBC: 2.42 MIL/uL — AB (ref 4.22–5.81)
RDW: 15.3 % (ref 11.5–15.5)
WBC: 8.3 10*3/uL (ref 4.0–10.5)

## 2014-04-14 LAB — HEPARIN INDUCED THROMBOCYTOPENIA PNL
Heparin Induced Plt Ab: NEGATIVE
Patient O.D.: 0.057
UFH HIGH DOSE UFH H: 0 %
UFH LOW DOSE 0.1 IU/ML: 0 %
UFH Low Dose 0.5 IU/mL: 0 % Release
UFH SRA Result: NEGATIVE

## 2014-04-14 MED ORDER — PANTOPRAZOLE SODIUM 40 MG PO TBEC: 40.0000 mg | DELAYED_RELEASE_TABLET | Freq: Every day | ORAL | Status: DC

## 2014-04-14 MED ORDER — BISACODYL 5 MG PO TBEC: 5.0000 mg | DELAYED_RELEASE_TABLET | Freq: Every day | ORAL | Status: DC | PRN

## 2014-04-14 MED ORDER — PANTOPRAZOLE SODIUM 40 MG IV SOLR
40.0000 mg | INTRAVENOUS | Status: DC
  Administered 2014-04-14: 40 mg via INTRAVENOUS
  Filled 2014-04-14: qty 40

## 2014-04-14 NOTE — Consult Note (Signed)
Patient ZO:XWRU NADAV SWINDELL      DOB: 30-Jan-1951      EAV:[id number]     Consult Note from the Palliative Medicine Team at [facility]    Consult Requested by: Dr. Marin Shutter     PCP: Janalyn Harder, MD Reason for Consultation: GOC and options     Phone Number:[phone]  Assessment of patients Current state: [name] is a 63 yo male admitted with ESRD on hemodialysis but with severe vascular access problems. He has a right femoral tunneled HD catheter that is poorly functional and no other options for access to administer hemodialysis. PMH significant for mental retardation, mute, seizures, CHF, SVC syndrome, esophageal varices, gastric ulcer.   I discussed with sisters Wille Celeste - HCPOA [phone] (h), [phone] (c), [phone] (w)) and Cookie. We discussed [name] and his poor prognosis with difficult vascular access for dialysis. He is currently receiving CRRT through poorly functional tunneled HD catheter placed 6/23 and already required TPA to get functioning and nursing reporting issues with use today but it is currently functioning. Family is very tearful with the realization that this is the last option for access to continue dialysis. They wish to continue dialysis for as long as possible. They do not want to make any further decisions (i.e. DNR) yet. They seem open to considering other options for comfort care when they officially loose access for dialysis - but at this point not until then. They were quite shocked when told that without dialysis prognosis is likely days to weeks. I gave them Hard Choices booklet and they plan to discuss with the rest of their family. I will continue to follow up on Monday - please call [phone] with any needs over the weekend.   Goals of Care: 1.  Code Status: FULL   2. Scope of Treatment: Continue all available and offered medical interventions. Family seems open to comfort options when he is unable to continue dialysis.    4. Disposition: To be  determined on outcomes.    3. Symptom Management:   1. Pain: Fentanyl prn.  2. Bowel Regimen: Senakot scheduled. Dulcolax daily prn.  3. Fever: Acetaminophen prn.   4. Psychosocial: Emotional support provided to patient and family.    Patient Documents Completed or Given: Document Given Completed  Advanced Directives Pkt    MOST    DNR    Gone from My Sight    Hard Choices yes     Brief HPI: 62 yo male with ESRD and requiring hemodialysis but his only option for vascular access is poorly functioning.    ROS: Unable to assess - mute and not following commands.     PMH:  Past Medical History  Diagnosis Date  . Heart murmur, systolic 08/10/2009  . Syncope 05/07/2009  . 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 Esophageal varices 11/07/2008  . Gastric ulcer 10/09    antral, with h pylori positive  . Congestive heart failure 03/06/2008  . Cellulitis and abscess of leg, except foot 03/06/2008  . Secondary hyperparathyroidism 02/02/2008  . Mute 02/02/2008  . Hyperlipidemia 02/02/2008  . Anemia 02/02/2008  . ESRD (end stage renal disease)     TTS hemodialysis  . GERD (gastroesophageal reflux disease)   . Hypertension 02/02/2008    in history  . Mental retardation 02/02/2008  . Mute   . Seizures     "non in a while at home". none in past year .  Marland Kitchen Complication of anesthesia     in the  past BP has dropped      PSH: Past Surgical History  Procedure Laterality Date  . Left forearm graft      for HD  . Arteriovenous graft placement  11/22/10    Right thigh AVG  . Thrombectomy and revision of arterioventous (av) goretex  graft    . Thrombectomy and revision of arterioventous (av) goretex  graft  10/22/2012    Procedure: THROMBECTOMY AND REVISION OF ARTERIOVENTOUS (AV) GORETEX  GRAFT;  Surgeon: Larina Earthlyodd F Early, MD;  [facility];  Service: Vascular;  Laterality: Right;  . Thrombectomy w/ embolectomy  11/10/2012    Procedure: THROMBECTOMY ARTERIOVENOUS GORE-TEX GRAFT;   Surgeon: Pryor OchoaJames D Lawson, MD;  [name];  Service: Vascular;  Laterality: Right;  . Thrombectomy and revision of arterioventous (av) goretex  graft Right 12/08/2012    Procedure: THROMBECTOMY AND REVISION OF ARTERIOVENTOUS (AV) GORETEX  GRAFT right thigh;  Surgeon: Sherren Kernsharles E Fields, MD;  [facility];  Service: Vascular;  Laterality: Right;  Susie Cassette. Venogram N/A 12/08/2012    Procedure: VENOGRAM;  Surgeon: Sherren Kernsharles E Fields, MD;  [facility];  Service: Vascular;  Laterality: N/A;  Intraoperative Central venogram  . Thrombectomy w/ embolectomy Right 12/12/2012    Procedure: THROMBECTOMY ARTERIOVENOUS GORE-TEX GRAFT;  Surgeon: Nada LibmanVance W Brabham, MD;  [facility];  Service: Vascular;  Laterality: Right;  . Insertion of dialysis catheter Left 12/14/2012    Procedure: INSERTION OF DIALYSIS CATHETER;  Surgeon: Nada LibmanVance W Brabham, MD;  [facility];  Service: Vascular;  Laterality: Left;  . Insertion of dialysis catheter Right 01/13/2013    Procedure: INSERTION OF DIALYSIS CATHETER;  Surgeon: Nada LibmanVance W Brabham, MD;  [facility];  Service: Vascular;  Laterality: Right;  . Removal of a dialysis catheter Left 01/13/2013    Procedure: REMOVAL OF A DIALYSIS CATHETER;  Surgeon: Nada LibmanVance W Brabham, MD;  [facility];  Service: Vascular;  Laterality: Left;  . Av fistula placement Left 02/11/2013    Procedure: INSERTION OF ARTERIOVENOUS (AV) GORE-TEX GRAFT THIGH;  Surgeon: Nada LibmanVance W Brabham, MD;  Location: [facility] OR;  Service: Vascular;  Laterality: Left;  using 6mm x 50cm Gore-Tex Vascular Graft  . Esophagogastroduodenoscopy N/A 02/16/2013    Procedure: ESOPHAGOGASTRODUODENOSCOPY (EGD);  Surgeon: Vertell NovakJames L Edwards Jr., MD;  [facility];  Service: Endoscopy;  Laterality: N/A;  bedside  . Esophagogastroduodenoscopy N/A 09/14/2013    Procedure: ESOPHAGOGASTRODUODENOSCOPY (EGD);  Surgeon: Vertell NovakJames L Edwards Jr., MD;  [facility];  Service: Endoscopy;  Laterality: N/A;  control of bleeding if needed  .  Esophagogastroduodenoscopy N/A 10/03/2013    Procedure: ESOPHAGOGASTRODUODENOSCOPY (EGD);  Surgeon: Theda BelfastPatrick D Hung, MD;  [facility];  Service: Endoscopy;  Laterality: N/A;  Bedside  . Radiology with anesthesia Right 10/19/2013    Procedure: RADIOLOGY WITH ANESTHESIA;  Surgeon: Malachy MoanHeath McCullough, MD;  [facility];  Service: Radiology;  Laterality: Right;  . Radiology with anesthesia Left 12/28/2013    Procedure: RADIOLOGY WITH ANESTHESIA;  Surgeon: Reola CalkinsGlenn T Yamagata, MD;  [facility];  Service: Radiology;  Laterality: Left;  . Thrombectomy and revision of arterioventous (av) goretex  graft Left 01/25/2014    Procedure: THROMBECTOMY AND REVISION OF LEFT THIGH ARTERIOVENTOUS (AV) GORETEX  GRAFT WITH PATCH ANGIOPLASTY;  Surgeon: Pryor OchoaJames D Lawson, MD;  [facility];  Service: Vascular;  Laterality: Left;  . Thrombectomy and revision of arterioventous (av) goretex  graft Left 04/10/2014    Procedure: THROMBECTOMY AND REVISION OF ARTERIOVENTOUS (AV) GORETEX  GRAFT;  Surgeon: Sherren Kerns, MD;  Location: [facility] OR;  Service: Vascular;  Laterality: Left;  . Patch angioplasty Left 04/10/2014    Procedure: PATCH ANGIOPLASTY LEFT SFA;  Surgeon: Sherren Kerns, MD;  Location: [facility] OR;  Service: Vascular;  Laterality: Left;  . Insertion of dialysis catheter Bilateral 04/11/2014    Procedure: INSERTION OF DIALYSIS CATHETER RIGHT FEMORAL VEIN; INSERTION OF TRIPLE LUMEN LEFT FEMORAL VEIN CENTRAL LINE; REMOVAL OF DIALYSIS CATHETER IN RIGHT FEMORAL VEIN.;  Surgeon: Nada Libman, MD;  [facility] OR;  Service: Vascular;  Laterality: Bilateral;   I have reviewed the FH and SH and  If appropriate update it with new information. No Known Allergies Scheduled Meds: . atorvastatin  20 mg Oral QHS  . cinacalcet  30 mg Oral BID WC  . clonazePAM  0.25 mg Oral BID  . feeding supplement (NEPRO CARB STEADY)  237 mL Oral BID BM  . hydrocortisone sodium succinate  50 mg Intravenous Q6H  . insulin aspart  0-15  Units Subcutaneous 6 times per day  . metoCLOPramide (REGLAN) injection  10 mg Intravenous 3 times per day  . midodrine  15 mg Oral TID  . multivitamin  1 tablet Oral QHS  . pantoprazole (PROTONIX) IV  40 mg Intravenous Q24H  . phenytoin (DILANTIN) IV  200 mg Intravenous QHS  . senna  1 tablet Oral BID  . sevelamer carbonate  800 mg Oral TID WC  . sodium chloride  10-40 mL Intracatheter Q12H   Continuous Infusions: . sodium chloride 10 mL/hr at 04/14/14 1500  . norepinephrine (LEVOPHED) Adult infusion 2 mcg/min (04/14/14 1500)  . dialysis replacement fluid (prismasate) 300 mL/hr at 04/14/14 0616  . dialysis replacement fluid (prismasate) 200 mL/hr at 04/14/14 0122  . dialysate (PRISMASATE) 2,000 mL/hr at 04/14/14 1206  . vasopressin (PITRESSIN) infusion - *FOR SHOCK* 0.03 Units/min (04/14/14 1500)   PRN Meds:.acetaminophen, acetaminophen, diphenhydrAMINE, fentaNYL, ondansetron, phenol, sodium chloride, sodium chloride    BP 98/78  Pulse 25  Temp(Src) 97.2 F (36.2 C) (Oral)  Resp 11  Ht 5' (1.524 m)  Wt 61.3 kg (135 lb 2.3 oz)  BMI 26.39 kg/m2  SpO2 100%   PPS: 30% at best   Intake/Output Summary (Last 24 hours) at 04/14/14 1521 Last data filed at 04/14/14 1500  Gross per 24 hour  Intake  847.6 ml  Output    735 ml  Net  112.6 ml   LBM: unknown                        Physical Exam:  General: NAD, resting comfortably HEENT: [place]/AT, no JVD, moist mucous membranes Chest: CTA throughout, no labored breathing, symmetric CVS: RRR, S1 S2 Abdomen: Soft, NT, ND, +BS Ext: No edema, warm to touch Neuro: Awake, alert, unable to assess orientation, unable to follow commands  Labs: CBC    Component Value Date/Time   WBC 8.3 04/14/2014 0400   RBC 2.42* 04/14/2014 0400   HGB 7.3* 04/14/2014 0400   HCT 21.9* 04/14/2014 0400   PLT 43* 04/14/2014 0400   MCV 90.5 04/14/2014 0400   MCH 30.2 04/14/2014 0400   MCHC 33.3 04/14/2014 0400   RDW 15.3 04/14/2014 0400   LYMPHSABS 1.1  10/02/2013 0305   MONOABS 0.2 10/02/2013 0305   EOSABS 0.0 10/02/2013 0305   BASOSABS 0.0 10/02/2013 0305    BMET    Component Value Date/Time   NA 136* 04/14/2014 0400   K 4.5 04/14/2014 0400  CL 96 04/14/2014 0400   CO2 21 04/14/2014 0400   GLUCOSE 112* 04/14/2014 0400   BUN 16 04/14/2014 0400   CREATININE 3.48* 04/14/2014 0400   CALCIUM 8.7 04/14/2014 0400   GFRNONAA 17* 04/14/2014 0400   GFRAA 20* 04/14/2014 0400    CMP     Component Value Date/Time   NA 136* 04/14/2014 0400   K 4.5 04/14/2014 0400   CL 96 04/14/2014 0400   CO2 21 04/14/2014 0400   GLUCOSE 112* 04/14/2014 0400   BUN 16 04/14/2014 0400   CREATININE 3.48* 04/14/2014 0400   CALCIUM 8.7 04/14/2014 0400   PROT 5.3* 04/10/2014 1602   ALBUMIN 3.2* 04/14/2014 0400   AST 15 04/10/2014 1602   ALT 11 04/10/2014 1602   ALKPHOS 97 04/10/2014 1602   BILITOT 0.3 04/10/2014 1602   GFRNONAA 17* 04/14/2014 0400   GFRAA 20* 04/14/2014 0400     Time In Time Out Total Time Spent with Patient Total Overall Time  1330 1450 70min 80min    Greater than 50%  of this time was spent counseling and coordinating care related to the above assessment and plan.  Yong ChannelAlicia Parker, NP Palliative Medicine Team Pager # [phone] (M-F 8a-5p) Team Phone # [phone] (Nights/Weekends)

## 2014-04-14 NOTE — Progress Notes (Signed)
UR Completed.  Vangie BickerBrown, Sokhna Christoph Jane [phone] 04/14/2014

## 2014-04-14 NOTE — Progress Notes (Signed)
[facility] R[facility]ING NOTE   Subjective:   Interval History: ate but catheter sluggish  Critical care meeting with family   Objective:  Vital signs in last 24 hours:  Temp:  [94.3 F (34.6 C)-97.4 F (36.3 C)] 97.2 F (36.2 C) (06/26 1200) Pulse Rate:  [25-124] 25 (06/26 0600) Resp:  [9-32] 32 (06/26 1300) BP: (79-158)/(26-121) 96/36 mmHg (06/26 1300) SpO2:  [88 %-100 %] 100 % (06/26 1200) Weight:  [61.3 kg (135 lb 2.3 oz)] 61.3 kg (135 lb 2.3 oz) (06/26 0600)  Weight change: 2.8 kg (6 lb 2.8 oz) Filed Weights   04/12/14 0400 04/13/14 0423 04/14/14 0600  Weight: 56.3 kg (124 lb 1.9 oz) 58.5 kg (128 lb 15.5 oz) 61.3 kg (135 lb 2.3 oz)    Intake/Output: I/O last 3 completed shifts: In: 1660.2 [I.V.:1139.7; Blood:312.5; IV Piggyback:208] Out: 806 [Other:806]   Intake/Output this shift:  Total I/O In: 169 [I.V.:169] Out: 144 [Other:144]  SVC syndrome  CVS- RRR  RS- CTA  ABD- BS present soft non-distended  EXT- no edema    Basic Metabolic Panel:  Recent Labs Lab 04/12/14 0400 04/12/14 1550 04/13/14 0445 04/13/14 1630 04/14/14 0400  NA 137 135* 134* 136* 136*  K 5.2 5.1 5.3 4.9 4.5  CL 99 98 97 99 96  CO2 22 24 22 22 21   GLUCOSE 177* 147* 146* 120* 112*  BUN 35* 28* 33* 23 16  CREATININE 8.49* 6.00* 6.86* 4.67* 3.48*  CALCIUM 8.9 8.7 9.0 8.7 8.7  PHOS 4.6 4.3 5.2* 3.9 3.4    Liver Function Tests:  Recent Labs Lab 04/10/14 1602  04/12/14 0400 04/12/14 1550 04/13/14 0445 04/13/14 1630 04/14/14 0400  AST 15  --   --   --   --   --   --   ALT 11  --   --   --   --   --   --   ALKPHOS 97  --   --   --   --   --   --   BILITOT 0.3  --   --   --   --   --   --   PROT 5.3*  --   --   --   --   --   --   ALBUMIN 3.2*  < > 3.1* 2.9* 2.9* 3.0* 3.2*  < > = values in this interval not displayed. No results found for this basename: LIPASE, AMYLASE,  in the last 168 hours No results found for this basename: AMMONIA,  in the last 168  hours  CBC:  Recent Labs Lab 04/10/14 1602 04/11/14 0500 04/12/14 0400 04/13/14 0443 04/13/14 1115 04/14/14 0400  WBC 9.0 12.2* 8.4 7.1  --  8.3  HGB 6.8* 11.7* 8.0* 6.0* 7.7* 7.3*  HCT 21.4* 34.7* 23.4* 17.9* 22.6* 21.9*  MCV 97.3 89.7 88.6 90.9  --  90.5  PLT 177 75* 64* 60*  --  43*    Cardiac Enzymes:  Recent Labs Lab 04/10/14 1824 04/10/14 2330 04/11/14 0513  TROPONINI <0.30 <0.30 <0.30    BNP: No components found with this basename: POCBNP,   CBG:  Recent Labs Lab 04/13/14 1920 04/13/14 2354 04/14/14 0323 04/14/14 0714 04/14/14 1217  GLUCAP 125* 157* 93 99 123*    Microbiology: Results for orders placed during the hospital encounter of 04/10/14  MRSA PCR SCREENING     Status: None   Collection Time    04/11/14 12:56 AM  Result Value Ref Range Status   MRSA by PCR NEGATIVE  NEGATIVE Final   Comment:            The GeneXpert MRSA Assay (FDA     approved for NASAL specimens     only), is one component of a     comprehensive MRSA colonization     surveillance program. It is not     intended to diagnose MRSA     infection nor to guide or     monitor treatment for     MRSA infections.    Coagulation Studies: No results found for this basename: LABPROT, INR,  in the last 72 hours  Urinalysis: No results found for this basename: COLORURINE, APPERANCEUR, LABSPEC, PHURINE, GLUCOSEU, HGBUR, BILIRUBINUR, KETONESUR, PROTEINUR, UROBILINOGEN, NITRITE, LEUKOCYTESUR,  in the last 72 hours    Imaging: No results found.   Medications:   . sodium chloride 10 mL/hr at 04/14/14 1300  . norepinephrine (LEVOPHED) Adult infusion 2 mcg/min (04/14/14 1300)  . dialysis replacement fluid (prismasate) 300 mL/hr at 04/14/14 0616  . dialysis replacement fluid (prismasate) 200 mL/hr at 04/14/14 0122  . dialysate (PRISMASATE) 2,000 mL/hr at 04/14/14 1206  . vasopressin (PITRESSIN) infusion - *FOR SHOCK* 0.03 Units/min (04/14/14 1300)   . atorvastatin  20 mg  Oral QHS  . cinacalcet  30 mg Oral BID WC  . clonazePAM  0.25 mg Oral BID  . feeding supplement (NEPRO CARB STEADY)  237 mL Oral BID BM  . hydrocortisone sodium succinate  50 mg Intravenous Q6H  . insulin aspart  0-15 Units Subcutaneous 6 times per day  . metoCLOPramide (REGLAN) injection  10 mg Intravenous 3 times per day  . midodrine  15 mg Oral TID  . multivitamin  1 tablet Oral QHS  . pantoprazole (PROTONIX) IV  40 mg Intravenous Q24H  . phenytoin (DILANTIN) IV  200 mg Intravenous QHS  . senna  1 tablet Oral BID  . sevelamer carbonate  800 mg Oral TID WC  . sodium chloride  10-40 mL Intracatheter Q12H   acetaminophen, acetaminophen, diphenhydrAMINE, fentaNYL, ondansetron, phenol, sodium chloride, sodium chloride    Assessment/ Plan:   ESRD- CRRT therapy K 4.5  CO2 21 ANEMIA- Hb 7.3 today MBD- controlled Ca 8.7  P 3.4  Albumin 3.2 HTN/VOL-stable still hypotensive  BP 96/36 levophed and vasopressin ACCESS- Poor functioning catheter no further options   Unfortunately there are no other options left and family meeting to be held today OTHER- hyperkalemia will remove K from replacement k 5.3      LOS: 4 WEBB,MARTIN W @TODAY @1 :50 PM

## 2014-04-14 NOTE — Progress Notes (Signed)
PULMONARY / CRITICAL CARE MEDICINE  Name: [name] MRN: [id number] DOB: 1951/10/13    ADMISSION DATE:  04/10/2014 CONSULTATION DATE:  04/10/2014  REFERRING MD :  Darrick PennaFields PRIMARY SERVICE:  Vascular  CHIEF COMPLAINT:  Hypotension  BRIEF PATIENT DESCRIPTION: 63 yo baseline non verbal with ESRD on HD admitted for revision of thrombosed R AV graft. The procedure was complicated by injury to left superficial femoral artery requiring patch and significant blood loss.  Brought to PACU hypotensive, requiring vasopressors.   SIGNIFICANT EVENTS / STUDIES:  6/22  Revision of thrombosed AV graft complicated by arterial injury requiring patch, blood loss and hypotension  LINES / TUBES: R F HD cath 6/22 >>> 6/23 Tunneled R F HD cath 6/23 >>> L F TLC 6/23 >>>  CULTURES:  ANTIBIOTICS: Cefazolin 6/22 x 1  INTERVAL HISTORY:  No acute overnight issues; poorly functional tunneled HD catheter  VITAL SIGNS: Temp:  [94.3 F (34.6 C)-97.4 F (36.3 C)] 97 F (36.1 C) (06/26 0713) Pulse Rate:  [25-124] 25 (06/26 0600) Resp:  [9-23] 11 (06/26 0800) BP: (79-158)/(26-121) 101/58 mmHg (06/26 0800) SpO2:  [88 %-100 %] 100 % (06/26 0800) Weight:  [61.3 kg (135 lb 2.3 oz)] 61.3 kg (135 lb 2.3 oz) (06/26 0600)  HEMODYNAMICS:   VENTILATOR SETTINGS:   INTAKE / OUTPUT: Intake/Output     06/25 0701 - 06/26 0700 06/26 0701 - 06/27 0700   P.O.     I.V. (mL/kg) 732.5 (11.9) 36.5 (0.6)   Blood 300    IV Piggyback 104    Total Intake(mL/kg) 1136.5 (18.5) 36.5 (0.6)   Other 806 26   Total Output 806 26   Net +330.5 +10.5          PHYSICAL EXAMINATION: General:  Resting comfortable Neuro:  Does not follow commands, non verbal HEENT:  PERRL Cardiovascular:  Regular Lungs:  Clear Abdomen:  Soft, nontender, bowel sounds diminished Musculoskeletal:  Moves all extremities Skin:  Intact  LABS: CBC  Recent Labs Lab 04/12/14 0400 04/13/14 0443 04/13/14 1115 04/14/14 0400  WBC 8.4 7.1  --   8.3  HGB 8.0* 6.0* 7.7* 7.3*  HCT 23.4* 17.9* 22.6* 21.9*  PLT 64* 60*  --  43*   Coag's No results found for this basename: APTT, INR,  in the last 168 hours  BMET  Recent Labs Lab 04/13/14 0445 04/13/14 1630 04/14/14 0400  NA 134* 136* 136*  K 5.3 4.9 4.5  CL 97 99 96  CO2 22 22 21   BUN 33* 23 16  CREATININE 6.86* 4.67* 3.48*  GLUCOSE 146* 120* 112*   Electrolytes  Recent Labs Lab 04/13/14 0445 04/13/14 1630 04/14/14 0400  CALCIUM 9.0 8.7 8.7  PHOS 5.2* 3.9 3.4   Sepsis Markers  Recent Labs Lab 04/10/14 1824 04/10/14 2300 04/11/14 0513  LATICACIDVEN 5.4* 4.7* 5.5*   ABG No results found for this basename: PHART, PCO2ART, PO2ART,  in the last 168 hours Liver Enzymes  Recent Labs Lab 04/10/14 1602  04/13/14 0445 04/13/14 1630 04/14/14 0400  AST 15  --   --   --   --   ALT 11  --   --   --   --   ALKPHOS 97  --   --   --   --   BILITOT 0.3  --   --   --   --   ALBUMIN 3.2*  < > 2.9* 3.0* 3.2*  < > = values in this interval not displayed. Cardiac Enzymes  Recent Labs Lab 04/10/14 1824 04/10/14 2330 04/11/14 0513  TROPONINI <0.30 <0.30 <0.30   Glucose  Recent Labs Lab 04/13/14 1127 04/13/14 1524 04/13/14 1920 04/13/14 2354 04/14/14 0323 04/14/14 0714  GLUCAP 108* 90 125* 157* 93 99   IMAGES: No results found. ASSESSMENT / PLAN:  PULMONARY A:   No active issues P:   Goal SpO2>92 Supplemental oxygen PRN  CARDIOVASCULAR A:  Chronic hypotension Shock ( hemorrhagic / hypovolemic / vasodilatory from general anesthesia; doubt septic ) Occluded L groin AV graft s/p attempted repair, femoral artery injury and patch Occluded R groin AV graft Chronically occluded SVC Difficult IV access P:  Goal SBP>80 Levophed gtt / Vasopressin gtt Midodrine and Lipitor when able  RENAL A:   ESRD on HD Hyperkalemia P:   Renal consulted ( Dr. Hyman HopesWebb, by Dr. Darrick PennaFields, 6/22 ) Trend BMP CVVH Sensipar / Elizebeth Brookingenvela May not be able to dialyze  further as the only access now failing     GASTROINTESTINAL A:   Nausea / vomiting post op, resolved GERD on PPI P:   Protonix Reglan scheduled Zofran PRM Place feeding tube and TF per Nutritionist after goals of care are established  HEMATOLOGIC A:  Acute blood loss anemia Thrombocytopenia  VTE Px P:  Trend CBC HIT panel Avoid Heparin SCD  INFECTIOUS A:   Doubt acute infection P:   Monitor off abx  ENDOCRINE  A:  Adrenal insufficiency ( cortisol 9 09/15/2013 ) Hyperglycemia, likely steroid induced P:   Hydrocortisone 50 mg q6h SSI  NEUROLOGIC A:   Baseline nonverbal History of seizures, none recent Post op pain P:   Fentanyl PRN Dilantin Klonopin bid when able  I have personally obtained history, examined patient, evaluated and interpreted laboratory and imaging results, reviewed medical records, formulated assessment / plan and placed orders.  CRITICAL CARE:  The patient is critically ill with multiple organ systems failure and requires high complexity decision making for assessment and support, frequent evaluation and titration of therapies, application of advanced monitoring technologies and extensive interpretation of multiple databases. Critical Care Time devoted to patient care services described in this note is 35 minutes.   Lonia FarberZUBELEVITSKIY, KONSTANTIN, MD Pulmonary and Critical Care Medicine [facility] Pager: [phone]  04/14/2014, 8:37 AM

## 2014-04-14 NOTE — Progress Notes (Addendum)
  [facility] Progress Note  04/14/2014 9:29 AM 3 Days Post-Op  Subjective:  Non verbal-appears comfortable.  Per RN, still having trouble with diatek cath.  afebrile  Filed Vitals:   04/14/14 0900  BP: 107/47  Pulse:   Temp:   Resp: 15    Physical Exam: Incisions:  Left groin is c/d/i Extremities:  + doppler signals Bilateral DP  CBC    Component Value Date/Time   WBC 8.3 04/14/2014 0400   RBC 2.42* 04/14/2014 0400   HGB 7.3* 04/14/2014 0400   HCT 21.9* 04/14/2014 0400   PLT 43* 04/14/2014 0400   MCV 90.5 04/14/2014 0400   MCH 30.2 04/14/2014 0400   MCHC 33.3 04/14/2014 0400   RDW 15.3 04/14/2014 0400   LYMPHSABS 1.1 10/02/2013 0305   MONOABS 0.2 10/02/2013 0305   EOSABS 0.0 10/02/2013 0305   BASOSABS 0.0 10/02/2013 0305    BMET    Component Value Date/Time   NA 136* 04/14/2014 0400   K 4.5 04/14/2014 0400   CL 96 04/14/2014 0400   CO2 21 04/14/2014 0400   GLUCOSE 112* 04/14/2014 0400   BUN 16 04/14/2014 0400   CREATININE 3.48* 04/14/2014 0400   CALCIUM 8.7 04/14/2014 0400   GFRNONAA 17* 04/14/2014 0400   GFRAA 20* 04/14/2014 0400    INR    Component Value Date/Time   INR 1.13 10/02/2013 0305     Intake/Output Summary (Last 24 hours) at 04/14/14 0929 Last data filed at 04/14/14 0900  Gross per 24 hour  Intake    755 ml  Output    824 ml  Net    -69 ml     Assessment:  63 y.o. male is s/p:  #1 patch angioplasty left superficial femoral artery #2 thrombectomy left thigh graft #3 patch angioplasty left venous anastomosis #4 new interposition graft arterial limb 4 mm PTFE  4 Days Post-Op  and  #1: Placement of left femoral vein ultrasound-guided triple-lumen catheter  #2: Removal of hemodialysis catheter  #3: Placement of right femoral tunneled dialysis catheter  3 Day Post-Op   Plan: -diatek still continues to not work well.  Difficult situation as pt does not have any other options for access.  Per RN, family will meet today at  1pm.    Doreatha MassedSamantha Rhyne, PA-C [facility] [phone] 04/14/2014 9:29 AM   Eating some Left groin incision healing Feet warm Still on levophed vasopressin Appreciate critical care, Renal assistance  Fabienne Brunsharles Fields, MD [facility] [place] Office: [phone] Pager: [phone]

## 2014-04-14 NOTE — Progress Notes (Signed)
Full note to follow:  I discussed with sisters [name] - HCPOA [phone]), [phone] (c), [phone] (w)) and [name]. We discussed [name] and his poor prognosis with difficult vascular access for dialysis. He is currently receiving CRRT through poorly functional tunneled HD catheter placed 6/23 and already required TPA to get functioning and nursing reporting issues with use today but it is currently functioning. Family is very tearful with the realization that this is the last option for access to continue dialysis. They wish to continue dialysis for as long as possible. They do not want to make any further decisions (i.e. DNR) yet. They seem open to considering other options for comfort care when they officially loose access for dialysis - but at this point not until then. I gave them Hard Choices booklet and they plan to discuss with the rest of their family. I will continue to follow up on Monday - please call [phone] with any needs over the weekend.  Yong ChannelAlicia Parker, NP Palliative Medicine Team Pager # [phone] (M-F 8a-5p) Team Phone # [phone] (Nights/Weekends)

## 2014-04-15 LAB — GLUCOSE, CAPILLARY
GLUCOSE-CAPILLARY: 171 mg/dL — AB (ref 70–99)
GLUCOSE-CAPILLARY: 111 mg/dL — AB (ref 70–99)
Glucose-Capillary: 135 mg/dL — ABNORMAL HIGH (ref 70–99)
Glucose-Capillary: 136 mg/dL — ABNORMAL HIGH (ref 70–99)
Glucose-Capillary: 70 mg/dL (ref 70–99)
Glucose-Capillary: 98 mg/dL (ref 70–99)

## 2014-04-15 LAB — CBC
HEMATOCRIT: 19.5 % — AB (ref 39.0–52.0)
HEMATOCRIT: 25.7 % — AB (ref 39.0–52.0)
Hemoglobin: 6.5 g/dL — CL (ref 13.0–17.0)
Hemoglobin: 8.7 g/dL — ABNORMAL LOW (ref 13.0–17.0)
MCH: 30.8 pg (ref 26.0–34.0)
MCH: 32.1 pg (ref 26.0–34.0)
MCHC: 33.3 g/dL (ref 30.0–36.0)
MCHC: 33.9 g/dL (ref 30.0–36.0)
MCV: 92.4 fL (ref 78.0–100.0)
MCV: 94.8 fL (ref 78.0–100.0)
Platelets: 57 10*3/uL — ABNORMAL LOW (ref 150–400)
Platelets: 42 10*3/uL — ABNORMAL LOW (ref 150–400)
RBC: 2.11 MIL/uL — ABNORMAL LOW (ref 4.22–5.81)
RBC: 2.71 MIL/uL — AB (ref 4.22–5.81)
RDW: 15.4 % (ref 11.5–15.5)
RDW: 14.9 % (ref 11.5–15.5)
WBC: 5.8 10*3/uL (ref 4.0–10.5)
WBC: 5.9 10*3/uL (ref 4.0–10.5)

## 2014-04-15 LAB — RENAL FUNCTION PANEL
ALBUMIN: 3 g/dL — AB (ref 3.5–5.2)
Albumin: 2.9 g/dL — ABNORMAL LOW (ref 3.5–5.2)
BUN: 10 mg/dL (ref 6–23)
BUN: 8 mg/dL (ref 6–23)
CHLORIDE: 99 meq/L (ref 96–112)
CO2: 26 mEq/L (ref 19–32)
CO2: 26 meq/L (ref 19–32)
CREATININE: 1.73 mg/dL — AB (ref 0.50–1.35)
Calcium: 7.9 mg/dL — ABNORMAL LOW (ref 8.4–10.5)
Calcium: 7.9 mg/dL — ABNORMAL LOW (ref 8.4–10.5)
Chloride: 101 mEq/L (ref 96–112)
Creatinine, Ser: 1.56 mg/dL — ABNORMAL HIGH (ref 0.50–1.35)
GFR calc Af Amer: 46 mL/min — ABNORMAL LOW (ref >90)
GFR calc Af Amer: 52 mL/min — ABNORMAL LOW (ref >90)
GFR calc non Af Amer: 40 mL/min — ABNORMAL LOW (ref >90)
GFR calc non Af Amer: 45 mL/min — ABNORMAL LOW (ref >90)
Glucose, Bld: 184 mg/dL — ABNORMAL HIGH (ref 70–99)
Glucose, Bld: 114 mg/dL — ABNORMAL HIGH (ref 70–99)
Phosphorus: 2.3 mg/dL (ref 2.3–4.6)
Phosphorus: 2.1 mg/dL — ABNORMAL LOW (ref 2.3–4.6)
Potassium: 4.8 mEq/L (ref 3.7–5.3)
Potassium: 4.3 mEq/L (ref 3.7–5.3)
Sodium: 137 mEq/L (ref 137–147)
Sodium: 137 mEq/L (ref 137–147)

## 2014-04-15 LAB — PREPARE RBC (CROSSMATCH)

## 2014-04-15 MED ORDER — PANTOPRAZOLE SODIUM 40 MG PO TBEC
40.0000 mg | DELAYED_RELEASE_TABLET | Freq: Every day | ORAL | Status: DC
  Administered 2014-04-15 – 2014-04-27 (×11): 40 mg via ORAL
  Filled 2014-04-15 (×8): qty 1

## 2014-04-15 NOTE — Progress Notes (Signed)
[facility] [place]  Subjective  - Awake alert, eating   Objective 108/43 61 97.5 F (36.4 C) (Axillary) 10 100%  Intake/Output Summary (Last 24 hours) at 04/15/14 1057 Last data filed at 04/15/14 1000  Gross per 24 hour  Intake 1515.2 ml  Output   1070 ml  Net  445.2 ml   Still on Levophed Left foot 2+ DP pulse, left groin incision CVVH  Assessment/Planning: POD #5 attempted thigh graft thrombectomy Still pressor dependent trying to wean per CCM Renal CVVH  [name],[name] 04/15/2014 10:57 AM --  Laboratory Lab Results:  Recent Labs  04/14/14 0400 04/15/14 0425  WBC 8.3 5.8  HGB 7.3* 6.5*  HCT 21.9* 19.5*  PLT 43* 57*   BMET  Recent Labs  04/14/14 1645 04/15/14 0425  NA 137 137  K 4.8 4.8  CL 99 101  CO2 26 26  GLUCOSE 140* 184*  BUN 12 10  CREATININE 2.24* 1.73*  CALCIUM 8.1* 7.9*    COAG Lab Results  Component Value Date   INR 1.13 10/02/2013   INR 1.00 09/14/2013   INR 1.47 02/13/2013   No results found for this basename: PTT

## 2014-04-15 NOTE — Progress Notes (Signed)
eLink Physician-Brief Progress Note Patient Name: [name] DOB: 11-21-1950 MRN: [id number]  Date of Service  04/15/2014   HPI/Events of Note   Lab Results  Component Value Date   WBC 5.8 04/15/2014   HGB 6.5* 04/15/2014   HCT 19.5* 04/15/2014   MCV 92.4 04/15/2014   PLT 57* 04/15/2014     eICU Interventions  Will transfuse 1 unit PRBC.   Intervention Category Major Interventions: Other:  SOOD,VINEET 04/15/2014, 5:26 AM

## 2014-04-15 NOTE — Progress Notes (Signed)
PULMONARY / CRITICAL CARE MEDICINE  Name: [name] MRN: [id number] DOB: 07/30/1951    ADMISSION DATE:  04/10/2014 CONSULTATION DATE:  04/10/2014  REFERRING MD :  Darrick PennaFields PRIMARY SERVICE:  Vascular  CHIEF COMPLAINT:  Hypotension  BRIEF PATIENT DESCRIPTION: 63 yo baseline non verbal with ESRD on HD admitted for revision of thrombosed R AV graft. The procedure was complicated by injury to left superficial femoral artery requiring patch and significant blood loss.  Brought to PACU hypotensive, requiring vasopressors.   SIGNIFICANT EVENTS / STUDIES:  6/22  Revision of thrombosed AV graft complicated by arterial injury requiring patch, blood loss and hypotension  LINES / TUBES: R F HD cath 6/22 >>> 6/23 Tunneled R F HD cath 6/23 >>> L F TLC 6/23 >>>  CULTURES:  ANTIBIOTICS: Cefazolin 6/22 x 1  INTERVAL HISTORY:  No acute overnight issues;  HD catheter working well for CVVH On warmer No obvious pain Remains on pressors  VITAL SIGNS: Temp:  [95 F (35 C)-97.2 F (36.2 C)] 95.9 F (35.5 C) (06/27 0300) Pulse Rate:  [59-97] 88 (06/27 0700) Resp:  [10-32] 17 (06/27 0700) BP: (75-127)/(24-78) 100/46 mmHg (06/27 0700) SpO2:  [95 %-100 %] 100 % (06/27 0700) Weight:  [60.8 kg (134 lb 0.6 oz)] 60.8 kg (134 lb 0.6 oz) (06/27 0700)  HEMODYNAMICS:   VENTILATOR SETTINGS:   INTAKE / OUTPUT: Intake/Output     06/26 0701 - 06/27 0700 06/27 0701 - 06/28 0700   P.O. 220    I.V. (mL/kg) 766.4 (12.6)    Blood     NG/GT 100    IV Piggyback 105    Total Intake(mL/kg) 1191.4 (19.6)    Other 971    Total Output 971     Net +220.4            PHYSICAL EXAMINATION: General:  Resting comfortable Neuro:  Does not follow commands, non verbal HEENT:  PERRL Cardiovascular:  Regular, no rub Lungs:  Clear Abdomen:  Soft, nontender, bowel sounds diminished Musculoskeletal:  Moves all extremities Skin:  Intact  LABS: CBC  Recent Labs Lab 04/13/14 0443 04/13/14 1115 04/14/14 0400  04/15/14 0425  WBC 7.1  --  8.3 5.8  HGB 6.0* 7.7* 7.3* 6.5*  HCT 17.9* 22.6* 21.9* 19.5*  PLT 60*  --  43* 57*   Coag's No results found for this basename: APTT, INR,  in the last 168 hours  BMET  Recent Labs Lab 04/14/14 0400 04/14/14 1645 04/15/14 0425  NA 136* 137 137  K 4.5 4.8 4.8  CL 96 99 101  CO2 21 26 26   BUN 16 12 10   CREATININE 3.48* 2.24* 1.73*  GLUCOSE 112* 140* 184*   Electrolytes  Recent Labs Lab 04/14/14 0400 04/14/14 1645 04/15/14 0425  CALCIUM 8.7 8.1* 7.9*  PHOS 3.4 2.9 2.3   Sepsis Markers  Recent Labs Lab 04/10/14 1824 04/10/14 2300 04/11/14 0513  LATICACIDVEN 5.4* 4.7* 5.5*   ABG No results found for this basename: PHART, PCO2ART, PO2ART,  in the last 168 hours Liver Enzymes  Recent Labs Lab 04/10/14 1602  04/14/14 0400 04/14/14 1645 04/15/14 0425  AST 15  --   --   --   --   ALT 11  --   --   --   --   ALKPHOS 97  --   --   --   --   BILITOT 0.3  --   --   --   --   ALBUMIN 3.2*  < >  3.2* 3.2* 3.0*  < > = values in this interval not displayed. Cardiac Enzymes  Recent Labs Lab 04/10/14 1824 04/10/14 2330 04/11/14 0513  TROPONINI <0.30 <0.30 <0.30   Glucose  Recent Labs Lab 04/14/14 0714 04/14/14 1217 04/14/14 1522 04/14/14 2032 04/14/14 2351 04/15/14 0344  GLUCAP 99 123* 139* 130* 136* 171*   IMAGES: No results found. ASSESSMENT / PLAN:  PULMONARY A:   No active issues P:   Goal SpO2>92 Supplemental oxygen PRN  CARDIOVASCULAR A:  Chronic hypotension Shock ( hemorrhagic / hypovolemic / vasodilatory from general anesthesia; doubt septic ) Occluded L groin AV graft s/p attempted repair, femoral artery injury and patch Occluded R groin AV graft Chronically occluded SVC Difficult IV access P:  Goal SBP>80 Levophed gtt / Vasopressin gtt Midodrine and Lipitor when able  RENAL A:   ESRD on HD Hyperkalemia P:   Renal following ( Dr. Hyman HopesWebb, by Dr. Darrick PennaFields, 6/22 ) Trend BMP CVVH while on  pressors Sensipar / Renvela    GASTROINTESTINAL A:   Nausea / vomiting post op, resolved GERD on PPI P:   Protonix Reglan scheduled Zofran PRM Advance PO  HEMATOLOGIC A:  Acute blood loss anemia Thrombocytopenia , HIT panel neg VTE Px P:  Trend CBC, transfuse 1 u for Hb <7 Avoid Heparin SCD  INFECTIOUS A:   Doubt acute infection P:   Monitor off abx  ENDOCRINE  A:  Adrenal insufficiency ( cortisol 9 09/15/2013 ) Hyperglycemia, likely steroid induced P:   Hydrocortisone 50 mg q6h SSI  NEUROLOGIC A:   Baseline nonverbal History of seizures, none recent Post op pain P:   Fentanyl PRN Dilantin Klonopin bid   GLOBAL - palliative conversation ongoing, sisters do want to ct HD while access lasts, hope we can get hi off pressors with PRBCs today -unclear cause of shock/?blood loss   I have personally obtained history, examined patient, evaluated and interpreted laboratory and imaging results, reviewed medical records, formulated assessment / plan and placed orders.  CRITICAL CARE:  The patient is critically ill with multiple organ systems failure and requires high complexity decision making for assessment and support, frequent evaluation and titration of therapies, application of advanced monitoring technologies and extensive interpretation of multiple databases. Critical Care Time devoted to patient care services described in this note is 32 minutes.   Oretha MilchALVA,RAKESH V., MD Cyril Mourningakesh Alva MD. FCCP. [facility] Pager [phone] If no response call [phone]    04/15/2014, 7:55 AM

## 2014-04-15 NOTE — Progress Notes (Signed)
[facility] ROUNDING NOTE   Subjective:   Interval History: Blood pressure improved  Objective:  Vital signs in last 24 hours:  Temp:  [95 F (35 C)-97.4 F (36.3 C)] 97.4 F (36.3 C) (06/27 0900) Pulse Rate:  [59-97] 62 (06/27 0900) Resp:  [0-32] 10 (06/27 0900) BP: (75-127)/(24-78) 117/40 mmHg (06/27 0900) SpO2:  [95 %-100 %] 100 % (06/27 0900) Weight:  [60.8 kg (134 lb 0.6 oz)] 60.8 kg (134 lb 0.6 oz) (06/27 0700)  Weight change: -0.5 kg (-1 lb 1.6 oz) Filed Weights   04/13/14 0423 04/14/14 0600 04/15/14 0700  Weight: 58.5 kg (128 lb 15.5 oz) 61.3 kg (135 lb 2.3 oz) 60.8 kg (134 lb 0.6 oz)    Intake/Output: I/O last 3 completed shifts: In: 1603.4 [P.O.:220; I.V.:1074.4; NG/GT:100; IV Piggyback:209] Out: 1401 [Other:1401]   Intake/Output this shift:  Total I/O In: 83 [I.V.:68; Blood:15] Out: 64 [Other:64] General: Resting comfortable  Neuro: Does not follow commands, non verbal SVC syndrome CVS- RRR RS- CTA ABD- BS present soft non-distended EXT- no edema   Basic Metabolic Panel:  Recent Labs Lab 04/13/14 0445 04/13/14 1630 04/14/14 0400 04/14/14 1645 04/15/14 0425  NA 134* 136* 136* 137 137  K 5.3 4.9 4.5 4.8 4.8  CL 97 99 96 99 101  CO2 22 22 21 26 26   GLUCOSE 146* 120* 112* 140* 184*  BUN 33* 23 16 12 10   CREATININE 6.86* 4.67* 3.48* 2.24* 1.73*  CALCIUM 9.0 8.7 8.7 8.1* 7.9*  PHOS 5.2* 3.9 3.4 2.9 2.3    Liver Function Tests:  Recent Labs Lab 04/10/14 1602  04/13/14 0445 04/13/14 1630 04/14/14 0400 04/14/14 1645 04/15/14 0425  AST 15  --   --   --   --   --   --   ALT 11  --   --   --   --   --   --   ALKPHOS 97  --   --   --   --   --   --   BILITOT 0.3  --   --   --   --   --   --   PROT 5.3*  --   --   --   --   --   --   ALBUMIN 3.2*  < > 2.9* 3.0* 3.2* 3.2* 3.0*  < > = values in this interval not displayed. No results found for this basename: LIPASE, AMYLASE,  in the last 168 hours No results found for this  basename: AMMONIA,  in the last 168 hours  CBC:  Recent Labs Lab 04/11/14 0500 04/12/14 0400 04/13/14 0443 04/13/14 1115 04/14/14 0400 04/15/14 0425  WBC 12.2* 8.4 7.1  --  8.3 5.8  HGB 11.7* 8.0* 6.0* 7.7* 7.3* 6.5*  HCT 34.7* 23.4* 17.9* 22.6* 21.9* 19.5*  MCV 89.7 88.6 90.9  --  90.5 92.4  PLT 75* 64* 60*  --  43* 57*    Cardiac Enzymes:  Recent Labs Lab 04/10/14 1824 04/10/14 2330 04/11/14 0513  TROPONINI <0.30 <0.30 <0.30    BNP: No components found with this basename: POCBNP,   CBG:  Recent Labs Lab 04/14/14 1522 04/14/14 2032 04/14/14 2351 04/15/14 0344 04/15/14 0850  GLUCAP 139* 130* 136* 171* 98    Microbiology: Results for orders placed during the hospital encounter of 04/10/14  MRSA PCR SCREENING     Status: None   Collection Time    04/11/14 12:56 AM      Result  Value Ref Range Status   MRSA by PCR NEGATIVE  NEGATIVE Final   Comment:            The GeneXpert MRSA Assay (FDA     approved for NASAL specimens     only), is one component of a     comprehensive MRSA colonization     surveillance program. It is not     intended to diagnose MRSA     infection nor to guide or     monitor treatment for     MRSA infections.    Coagulation Studies: No results found for this basename: LABPROT, INR,  in the last 72 hours  Urinalysis: No results found for this basename: COLORURINE, APPERANCEUR, LABSPEC, PHURINE, GLUCOSEU, HGBUR, BILIRUBINUR, KETONESUR, PROTEINUR, UROBILINOGEN, NITRITE, LEUKOCYTESUR,  in the last 72 hours    Imaging: No results found.   Medications:   . sodium chloride 10 mL/hr at 04/15/14 0900  . norepinephrine (LEVOPHED) Adult infusion 4 mcg/min (04/15/14 0900)  . dialysis replacement fluid (prismasate) 300 mL/hr at 04/15/14 0000  . dialysis replacement fluid (prismasate) 200 mL/hr at 04/15/14 0306  . dialysate (PRISMASATE) 2,000 mL/hr at 04/15/14 0830  . vasopressin (PITRESSIN) infusion - *FOR SHOCK* 0.03 Units/min  (04/15/14 0900)   . atorvastatin  20 mg Oral QHS  . cinacalcet  30 mg Oral BID WC  . clonazePAM  0.25 mg Oral BID  . feeding supplement (NEPRO CARB STEADY)  237 mL Oral BID BM  . hydrocortisone sodium succinate  50 mg Intravenous Q6H  . insulin aspart  0-15 Units Subcutaneous 6 times per day  . metoCLOPramide (REGLAN) injection  10 mg Intravenous 3 times per day  . midodrine  15 mg Oral TID  . multivitamin  1 tablet Oral QHS  . pantoprazole  40 mg Oral Q1200  . phenytoin (DILANTIN) IV  200 mg Intravenous QHS  . senna  1 tablet Oral BID  . sevelamer carbonate  800 mg Oral TID WC  . 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

## 2014-04-16 LAB — RENAL FUNCTION PANEL
ALBUMIN: 3.2 g/dL — AB (ref 3.5–5.2)
ALBUMIN: 3.1 g/dL — AB (ref 3.5–5.2)
BUN: 6 mg/dL (ref 6–23)
BUN: 7 mg/dL (ref 6–23)
CHLORIDE: 102 meq/L (ref 96–112)
CO2: 28 mEq/L (ref 19–32)
CO2: 27 mEq/L (ref 19–32)
CREATININE: 1.47 mg/dL — AB (ref 0.50–1.35)
Calcium: 7.9 mg/dL — ABNORMAL LOW (ref 8.4–10.5)
Calcium: 7.9 mg/dL — ABNORMAL LOW (ref 8.4–10.5)
Chloride: 101 mEq/L (ref 96–112)
Creatinine, Ser: 1.58 mg/dL — ABNORMAL HIGH (ref 0.50–1.35)
GFR, EST AFRICAN AMERICAN: 51 mL/min — AB (ref >90)
GFR, EST AFRICAN AMERICAN: 56 mL/min — AB (ref >90)
GFR, EST NON AFRICAN AMERICAN: 44 mL/min — AB (ref >90)
GFR, EST NON AFRICAN AMERICAN: 48 mL/min — AB (ref >90)
Glucose, Bld: 88 mg/dL (ref 70–99)
Glucose, Bld: 116 mg/dL — ABNORMAL HIGH (ref 70–99)
PHOSPHORUS: 2.3 mg/dL (ref 2.3–4.6)
Phosphorus: 2.3 mg/dL (ref 2.3–4.6)
Potassium: 5 mEq/L (ref 3.7–5.3)
Potassium: 4.7 mEq/L (ref 3.7–5.3)
SODIUM: 140 meq/L (ref 137–147)
SODIUM: 138 meq/L (ref 137–147)

## 2014-04-16 LAB — GLUCOSE, CAPILLARY
GLUCOSE-CAPILLARY: 101 mg/dL — AB (ref 70–99)
GLUCOSE-CAPILLARY: 179 mg/dL — AB (ref 70–99)
GLUCOSE-CAPILLARY: 73 mg/dL (ref 70–99)
Glucose-Capillary: 102 mg/dL — ABNORMAL HIGH (ref 70–99)
Glucose-Capillary: 99 mg/dL (ref 70–99)

## 2014-04-16 LAB — CBC
HCT: 24.1 % — ABNORMAL LOW (ref 39.0–52.0)
Hemoglobin: 8.2 g/dL — ABNORMAL LOW (ref 13.0–17.0)
MCH: 32 pg (ref 26.0–34.0)
MCHC: 34 g/dL (ref 30.0–36.0)
MCV: 94.1 fL (ref 78.0–100.0)
PLATELETS: 52 10*3/uL — AB (ref 150–400)
RBC: 2.56 MIL/uL — AB (ref 4.22–5.81)
RDW: 15 % (ref 11.5–15.5)
WBC: 7.9 10*3/uL (ref 4.0–10.5)

## 2014-04-16 NOTE — Progress Notes (Signed)
[facility] [place]  Subjective  - Lethargic this morning  Objective 103/47 58 95.5 F (35.3 C) (Axillary) 13 100%  Intake/Output Summary (Last 24 hours) at 04/16/14 [id number] Last data filed at 04/16/14 0800  Gross per 24 hour  Intake 1031.38 ml  Output  -1233 ml  Net 2264.38 ml   Less pressor than yesterday Left groin healing  Feet warm  Assessment/Planning: Overall slow improvement Continue to try to wean pressors  FIELDS,CHARLES E 04/16/2014 8:22 AM --  Laboratory Lab Results:  Recent Labs  04/15/14 1252 04/16/14 0450  WBC 5.9 7.9  HGB 8.7* 8.2*  HCT 25.7* 24.1*  PLT 42* 52*   BMET  Recent Labs  04/15/14 1610 04/16/14 0450  NA 137 140  K 4.3 5.0  CL 99 102  CO2 26 28  GLUCOSE 114* 88  BUN 8 6  CREATININE 1.56* 1.58*  CALCIUM 7.9* 7.9*    COAG Lab Results  Component Value Date   INR 1.13 10/02/2013   INR 1.00 09/14/2013   INR 1.47 02/13/2013   No results found for this basename: PTT

## 2014-04-16 NOTE — Progress Notes (Signed)
Patient has refused food/medication entire shift.  Will not accept food/liquids from staff or family.  Spoke with sisters, Margaretha GlassingLoretta (Cookie) and GhanaJanie regarding issue during visitation.  Patient pleasant, mostly cooperative, just refuses to eat.  Continue to encourage patient.

## 2014-04-16 NOTE — Consult Note (Signed)
I have reviewed this case with our NP and agree with the Assessment and Plan as stated.  Melissa L. Taylor, MD MBA [facility] Pall[facility] Team at [facility] Phone: [phone] Pager: [phone]   

## 2014-04-16 NOTE — Progress Notes (Signed)
[facility] ROUNDING NOTE   Subjective:   Interval History: not much change not eating this morning back on pressors  Objective:  Vital signs in last 24 hours:  Temp:  [95.5 F (35.3 C)-97.5 F (36.4 C)] 96.1 F (35.6 C) (06/28 0827) Pulse Rate:  [55-121] 58 (06/28 0800) Resp:  [8-25] 13 (06/28 0800) BP: (64-141)/(32-112) 103/47 mmHg (06/28 0800) SpO2:  [92 %-100 %] 100 % (06/28 0800) Weight:  [59.421 kg (131 lb)] 59.421 kg (131 lb) (06/28 0700)  Weight change: -1.379 kg (-3 lb 0.6 oz) Filed Weights   04/14/14 0600 04/15/14 0700 04/16/14 0700  Weight: 61.3 kg (135 lb 2.3 oz) 60.8 kg (134 lb 0.6 oz) 59.421 kg (131 lb)    Intake/Output: I/O last 3 completed shifts: In: 1586 [P.O.:300; I.V.:1062; Blood:15; IV Piggyback:209] Out: -765    Intake/Output this shift:  Total I/O In: 37.8 [I.V.:37.8] Out: 27 [Other:27]  Neuro: Does not follow commands, non verbal  SVC syndrome  CVS- RRR  RS- CTA  ABD- BS present soft non-distended  EXT- no edema    Basic Metabolic Panel:  Recent Labs Lab 04/14/14 0400 04/14/14 1645 04/15/14 0425 04/15/14 1610 04/16/14 0450  NA 136* 137 137 137 140  K 4.5 4.8 4.8 4.3 5.0  CL 96 99 101 99 102  CO2 21 26 26 26 28   GLUCOSE 112* 140* 184* 114* 88  BUN 16 12 10 8 6   CREATININE 3.48* 2.24* 1.73* 1.56* 1.58*  CALCIUM 8.7 8.1* 7.9* 7.9* 7.9*  PHOS 3.4 2.9 2.3 2.1* 2.3    Liver Function Tests:  Recent Labs Lab 04/10/14 1602  04/14/14 0400 04/14/14 1645 04/15/14 0425 04/15/14 1610 04/16/14 0450  AST 15  --   --   --   --   --   --   ALT 11  --   --   --   --   --   --   ALKPHOS 97  --   --   --   --   --   --   BILITOT 0.3  --   --   --   --   --   --   PROT 5.3*  --   --   --   --   --   --   ALBUMIN 3.2*  < > 3.2* 3.2* 3.0* 2.9* 3.2*  < > = values in this interval not displayed. No results found for this basename: LIPASE, AMYLASE,  in the last 168 hours No results found for this basename: AMMONIA,  in the  last 168 hours  CBC:  Recent Labs Lab 04/13/14 0443 04/13/14 1115 04/14/14 0400 04/15/14 0425 04/15/14 1252 04/16/14 0450  WBC 7.1  --  8.3 5.8 5.9 7.9  HGB 6.0* 7.7* 7.3* 6.5* 8.7* 8.2*  HCT 17.9* 22.6* 21.9* 19.5* 25.7* 24.1*  MCV 90.9  --  90.5 92.4 94.8 94.1  PLT 60*  --  43* 57* 42* 52*    Cardiac Enzymes:  Recent Labs Lab 04/10/14 1824 04/10/14 2330 04/11/14 0513  TROPONINI <0.30 <0.30 <0.30    BNP: No components found with this basename: POCBNP,   CBG:  Recent Labs Lab 04/15/14 1523 04/15/14 2028 04/16/14 0018 04/16/14 0348 04/16/14 0823  GLUCAP 70 111* 179* 73 102*    Microbiology: Results for orders placed during the hospital encounter of 04/10/14  MRSA PCR SCREENING     Status: None   Collection Time    04/11/14 12:56 AM  Result Value Ref Range Status   MRSA by PCR NEGATIVE  NEGATIVE Final   Comment:            The GeneXpert MRSA Assay (FDA     approved for NASAL specimens     only), is one component of a     comprehensive MRSA colonization     surveillance program. It is not     intended to diagnose MRSA     infection nor to guide or     monitor treatment for     MRSA infections.    Coagulation Studies: No results found for this basename: LABPROT, INR,  in the last 72 hours  Urinalysis: No results found for this basename: COLORURINE, APPERANCEUR, LABSPEC, PHURINE, GLUCOSEU, HGBUR, BILIRUBINUR, KETONESUR, PROTEINUR, UROBILINOGEN, NITRITE, LEUKOCYTESUR,  in the last 72 hours    Imaging: No results found.   Medications:   . sodium chloride 10 mL/hr at 04/16/14 0800  . norepinephrine (LEVOPHED) Adult infusion 5.013 mcg/min (04/16/14 0800)  . dialysis replacement fluid (prismasate) 300 mL/hr at 04/15/14 1636  . dialysis replacement fluid (prismasate) 200 mL/hr at 04/16/14 0536  . dialysate (PRISMASATE) 2,000 mL/hr at 04/16/14 0535  . vasopressin (PITRESSIN) infusion - *FOR SHOCK* 0.03 Units/min (04/16/14 0800)   .  atorvastatin  20 mg Oral QHS  . cinacalcet  30 mg Oral BID WC  . clonazePAM  0.25 mg Oral BID  . feeding supplement (NEPRO CARB STEADY)  237 mL Oral BID BM  . hydrocortisone sodium succinate  50 mg Intravenous Q6H  . insulin aspart  0-15 Units Subcutaneous 6 times per day  . metoCLOPramide (REGLAN) injection  10 mg Intravenous 3 times per day  . midodrine  15 mg Oral TID  . multivitamin  1 tablet Oral QHS  . pantoprazole  40 mg Oral Q1200  . phenytoin (DILANTIN) IV  200 mg Intravenous QHS  . senna  1 tablet Oral BID  . sevelamer carbonate  800 mg Oral TID WC  . 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

## 2014-04-16 NOTE — Progress Notes (Signed)
PULMONARY / CRITICAL CARE MEDICINE  Name: [name] MRN: [id number] DOB: [birth year]    ADMISSION DATE:  04/10/2014 CONSULTATION DATE:  04/10/2014  REFERRING MD :  Darrick PennaFields PRIMARY SERVICE:  Vascular  CHIEF COMPLAINT:  Hypotension  BRIEF PATIENT DESCRIPTION: 63 yo baseline non verbal with ESRD on HD admitted for revision of thrombosed R AV graft. The procedure was complicated by injury to left superficial femoral artery requiring patch and significant blood loss.  Brought to PACU hypotensive, requiring vasopressors.   SIGNIFICANT EVENTS / STUDIES:  6/22  Revision of thrombosed AV graft complicated by arterial injury requiring patch, blood loss and hypotension  LINES / TUBES: R F HD cath 6/22 >>> 6/23 Tunneled R F HD cath 6/23 >>> L F TLC 6/23 >>>  CULTURES:  ANTIBIOTICS: Cefazolin 6/22 x 1  INTERVAL HISTORY:  No acute overnight issues;  HD catheter working well for CVVH On warmer No obvious pain Back  on low dose pressors RN concerned about aspiration  VITAL SIGNS: Temp:  [95.5 F (35.3 C)-96.7 F (35.9 C)] 96.1 F (35.6 C) (06/28 0827) Pulse Rate:  [55-121] 62 (06/28 1000) Resp:  [8-25] 11 (06/28 1000) BP: (64-141)/(32-112) 90/48 mmHg (06/28 1000) SpO2:  [92 %-100 %] 100 % (06/28 1000) Weight:  [59.421 kg (131 lb)] 59.421 kg (131 lb) (06/28 0700)  HEMODYNAMICS:   VENTILATOR SETTINGS:   INTAKE / OUTPUT: Intake/Output     06/27 0701 - 06/28 0700 06/28 0701 - 06/29 0700   P.O. 300    I.V. (mL/kg) 623.6 (10.5) 113.4 (1.9)   Blood 15    NG/GT     IV Piggyback 104    Total Intake(mL/kg) 1042.6 (17.5) 113.4 (1.9)   Other -1201 83   Total Output -1201 83   Net +2243.6 +30.4          PHYSICAL EXAMINATION: General:  Resting comfortable Neuro:  Does not follow commands, non verbal HEENT:  PERRL Cardiovascular:  Regular, no rub Lungs:  Clear Abdomen:  Soft, nontender, bowel sounds diminished Musculoskeletal:  Moves all extremities Skin:   Intact  LABS: CBC  Recent Labs Lab 04/15/14 0425 04/15/14 1252 04/16/14 0450  WBC 5.8 5.9 7.9  HGB 6.5* 8.7* 8.2*  HCT 19.5* 25.7* 24.1*  PLT 57* 42* 52*   Coag's No results found for this basename: APTT, INR,  in the last 168 hours  BMET  Recent Labs Lab 04/15/14 0425 04/15/14 1610 04/16/14 0450  NA 137 137 140  K 4.8 4.3 5.0  CL 101 99 102  CO2 26 26 28   BUN 10 8 6   CREATININE 1.73* 1.56* 1.58*  GLUCOSE 184* 114* 88   Electrolytes  Recent Labs Lab 04/15/14 0425 04/15/14 1610 04/16/14 0450  CALCIUM 7.9* 7.9* 7.9*  PHOS 2.3 2.1* 2.3   Sepsis Markers  Recent Labs Lab 04/10/14 1824 04/10/14 2300 04/11/14 0513  LATICACIDVEN 5.4* 4.7* 5.5*   ABG No results found for this basename: PHART, PCO2ART, PO2ART,  in the last 168 hours Liver Enzymes  Recent Labs Lab 04/10/14 1602  04/15/14 0425 04/15/14 1610 04/16/14 0450  AST 15  --   --   --   --   ALT 11  --   --   --   --   ALKPHOS 97  --   --   --   --   BILITOT 0.3  --   --   --   --   ALBUMIN 3.2*  < > 3.0* 2.9* 3.2*  < > =  values in this interval not displayed. Cardiac Enzymes  Recent Labs Lab 04/10/14 1824 04/10/14 2330 04/11/14 0513  TROPONINI <0.30 <0.30 <0.30   Glucose  Recent Labs Lab 04/15/14 1115 04/15/14 1523 04/15/14 2028 04/16/14 0018 04/16/14 0348 04/16/14 0823  GLUCAP 135* 70 111* 179* 73 102*   IMAGES: No results found. ASSESSMENT / PLAN:  PULMONARY A:   No active issues P:   Goal SpO2>92 Supplemental oxygen PRN  CARDIOVASCULAR A:  Chronic hypotension Shock ( hemorrhagic / hypovolemic / vasodilatory from general anesthesia; doubt septic ) Occluded L groin AV graft s/p attempted repair, femoral artery injury and patch Occluded R groin AV graft Chronically occluded SVC Difficult IV access P:  Goal SBP>80 Levophed gtt / Vasopressin gtt Midodrine and Lipitor when able  RENAL A:   ESRD on HD Hyperkalemia P:   Renal following ( Dr. Hyman HopesWebb, by Dr.  Darrick PennaFields, 6/22 ) Trend BMP CVVH while on pressors, then transition to iHD Sensipar / Renvela    GASTROINTESTINAL A:   Nausea / vomiting post op, resolved GERD on PPI Concern for dysphagia P:   Protonix Reglan scheduled Zofran PRM Swallow eval  HEMATOLOGIC A:  Acute blood loss anemia Thrombocytopenia , HIT panel neg VTE Px P:  Trend CBC, transfuse 1 u for Hb <7 Avoid Heparin SCD  INFECTIOUS A:   Doubt acute infection P:   Monitor off abx  ENDOCRINE  A:  Adrenal insufficiency ( cortisol 9 09/15/2013 ) Hyperglycemia, likely steroid induced P:   Hydrocortisone 50 mg q6h SSI  NEUROLOGIC A:   Baseline nonverbal History of seizures, none recent Post op pain P:   Fentanyl PRN Dilantin Klonopin bid   GLOBAL - palliative conversation ongoing, sisters do want to ct HD while access lasts, hope we can get him off pressors -unclear cause of shock/?blood loss   I have personally obtained history, examined patient, evaluated and interpreted laboratory and imaging results, reviewed medical records, formulated assessment / plan and placed orders.  CRITICAL CARE:  The patient is critically ill with multiple organ systems failure and requires high complexity decision making for assessment and support, frequent evaluation and titration of therapies, application of advanced monitoring technologies and extensive interpretation of multiple databases. Critical Care Time devoted to patient care services described in this note is 32 minutes.   Oretha MilchALVA,RAKESH V., MD Cyril Mourningakesh Alva MD. FCCP. [facility] Pager [phone] If no response call [phone]    04/16/2014, 11:25 AM

## 2014-04-17 ENCOUNTER — Inpatient Hospital Stay (HOSPITAL_COMMUNITY): Payer: Medicare Other

## 2014-04-17 DIAGNOSIS — I959 Hypotension, unspecified: Secondary | ICD-10-CM

## 2014-04-17 LAB — CBC
HEMATOCRIT: 22.8 % — AB (ref 39.0–52.0)
HEMOGLOBIN: 7.7 g/dL — AB (ref 13.0–17.0)
MCH: 32.1 pg (ref 26.0–34.0)
MCHC: 33.8 g/dL (ref 30.0–36.0)
MCV: 95 fL (ref 78.0–100.0)
Platelets: 64 10*3/uL — ABNORMAL LOW (ref 150–400)
RBC: 2.4 MIL/uL — ABNORMAL LOW (ref 4.22–5.81)
RDW: 15.2 % (ref 11.5–15.5)
WBC: 8 10*3/uL (ref 4.0–10.5)

## 2014-04-17 LAB — GLUCOSE, CAPILLARY
GLUCOSE-CAPILLARY: 109 mg/dL — AB (ref 70–99)
GLUCOSE-CAPILLARY: 125 mg/dL — AB (ref 70–99)
Glucose-Capillary: 103 mg/dL — ABNORMAL HIGH (ref 70–99)
Glucose-Capillary: 124 mg/dL — ABNORMAL HIGH (ref 70–99)
Glucose-Capillary: 124 mg/dL — ABNORMAL HIGH (ref 70–99)
Glucose-Capillary: 82 mg/dL (ref 70–99)
Glucose-Capillary: 93 mg/dL (ref 70–99)
Glucose-Capillary: 95 mg/dL (ref 70–99)

## 2014-04-17 LAB — RENAL FUNCTION PANEL
ALBUMIN: 3.3 g/dL — AB (ref 3.5–5.2)
Albumin: 3.1 g/dL — ABNORMAL LOW (ref 3.5–5.2)
BUN: 8 mg/dL (ref 6–23)
BUN: 17 mg/dL (ref 6–23)
CALCIUM: 8.3 mg/dL — AB (ref 8.4–10.5)
CALCIUM: 8.1 mg/dL — AB (ref 8.4–10.5)
CO2: 25 mEq/L (ref 19–32)
CO2: 25 meq/L (ref 19–32)
CREATININE: 1.62 mg/dL — AB (ref 0.50–1.35)
CREATININE: 3.03 mg/dL — AB (ref 0.50–1.35)
Chloride: 101 mEq/L (ref 96–112)
Chloride: 99 mEq/L (ref 96–112)
GFR calc Af Amer: 50 mL/min — ABNORMAL LOW (ref >90)
GFR calc Af Amer: 23 mL/min — ABNORMAL LOW (ref >90)
GFR calc non Af Amer: 43 mL/min — ABNORMAL LOW (ref >90)
GFR calc non Af Amer: 20 mL/min — ABNORMAL LOW (ref >90)
GLUCOSE: 115 mg/dL — AB (ref 70–99)
GLUCOSE: 135 mg/dL — AB (ref 70–99)
PHOSPHORUS: 2.1 mg/dL — AB (ref 2.3–4.6)
PHOSPHORUS: 3.4 mg/dL (ref 2.3–4.6)
Potassium: 4.6 mEq/L (ref 3.7–5.3)
Potassium: 3.8 mEq/L (ref 3.7–5.3)
Sodium: 138 mEq/L (ref 137–147)
Sodium: 138 mEq/L (ref 137–147)

## 2014-04-17 LAB — IRON AND TIBC
Iron: 61 ug/dL (ref 42–135)
SATURATION RATIOS: 35 % (ref 20–55)
TIBC: 175 ug/dL — AB (ref 215–435)
UIBC: 114 ug/dL — ABNORMAL LOW (ref 125–400)

## 2014-04-17 LAB — FERRITIN: Ferritin: 284 ng/mL (ref 22–322)

## 2014-04-17 LAB — FOLATE: FOLATE: 17.9 ng/mL

## 2014-04-17 LAB — VITAMIN B12: VITAMIN B 12: 861 pg/mL (ref 211–911)

## 2014-04-17 LAB — RETICULOCYTES
RBC.: 2.3 MIL/uL — AB (ref 4.22–5.81)
Retic Count, Absolute: 52.9 10*3/uL (ref 19.0–186.0)
Retic Ct Pct: 2.3 % (ref 0.4–3.1)

## 2014-04-17 MED ORDER — HEPARIN SODIUM (PORCINE) 1000 UNIT/ML IJ SOLN
5000.0000 [IU] | Freq: Once | INTRAMUSCULAR | Status: AC
  Administered 2014-04-17: 5000 [IU] via INTRAVENOUS
  Filled 2014-04-17: qty 5

## 2014-04-17 MED ORDER — ALTEPLASE 100 MG IV SOLR
4.0000 mg | Freq: Once | INTRAVENOUS | Status: AC
  Administered 2014-04-17: 4 mg
  Filled 2014-04-17: qty 4

## 2014-04-17 MED ORDER — HEPARIN SODIUM (PORCINE) 1000 UNIT/ML IJ SOLN
2400.0000 [IU] | Freq: Once | INTRAMUSCULAR | Status: AC
  Administered 2014-04-17: 2400 [IU] via INTRAVENOUS

## 2014-04-17 MED ORDER — HEPARIN SODIUM (PORCINE) 1000 UNIT/ML IJ SOLN
5000.0000 [IU] | Freq: Once | INTRAMUSCULAR | Status: DC
  Filled 2014-04-17: qty 5

## 2014-04-17 MED ORDER — SODIUM PHOSPHATE 3 MMOLE/ML IV SOLN
20.0000 mmol | Freq: Once | INTRAVENOUS | Status: AC
  Administered 2014-04-17: 20 mmol via INTRAVENOUS
  Filled 2014-04-17: qty 6.67

## 2014-04-17 MED ORDER — HEPARIN SODIUM (PORCINE) 1000 UNIT/ML IJ SOLN
2600.0000 [IU] | Freq: Once | INTRAMUSCULAR | Status: AC
  Administered 2014-04-17: 2600 [IU] via INTRAVENOUS

## 2014-04-17 MED ORDER — ALTEPLASE 100 MG IV SOLR
2.0000 mg | Freq: Once | INTRAVENOUS | Status: AC
  Administered 2014-04-17: 2 mg
  Filled 2014-04-17: qty 2

## 2014-04-17 NOTE — Progress Notes (Signed)
eLink Physician-Brief Progress Note Patient Name: [name] DOB: July 02, 1951 MRN: [id number]  Date of Service  04/17/2014   HPI/Events of Note  Hypophosphatemia in the setting of ESRD on CRRT   eICU Interventions  Plan: Replaced with 20 mmol Na Phos IV   Intervention Category Intermediate Interventions: Electrolyte abnormality - evaluation and management  DETERDING,ELIZABETH 04/17/2014, 4:46 AM

## 2014-04-17 NOTE — Progress Notes (Signed)
I spoke with [name]' nurse today who says that he has had more issues with his HD catheter functioning and is planning for TPA today. He continues to not eat well for us here in the hospital - his sisters tell me that this is typical behavior for him and that he eats well when at home. I will continue to follow how his catheter is functioning and will follow up with family likely tomorrow - no one at bedside presently.   Yong ChannelAlicia Shaunessy Dobratz, NP Palliative Medicine Team Pager # [phone] (M-F 8a-5p) Team Phone # [phone] (Nights/Weekends)

## 2014-04-17 NOTE — Progress Notes (Signed)
SLP Cancellation Note  Patient Details Name: [name] MRN: [id number] DOB: [birth year]   Cancelled treatment:         Attempted bedside swallow evaluation.  Pt refused all po's offered, pushing this examiner's hand away and turning his head away.  Max. Encouragement and various items were offered, all with the same response from the pt.  RN and Nursing tech both report that pt has refused po's all day today.  Will reattempt 6/30.   Maryjo RochesterWillis, Lori T 04/17/2014, 1:45 PM

## 2014-04-17 NOTE — Progress Notes (Signed)
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  11/10/12 Thrombectomy, R thigh graft  12/08/12 Thrombectomy, R thigh graft, shuntogram with no stenosis, good flow, repeat clotting maybe due to chronic hypotension  12/12/12 Thrombectomy, R thigh graft  12/14/12 L femoral tunneled HD cath placement  3/4 - 12/25/12 Seizure witnessed after HD, low BP (chronic), continue dilantin and klonopin, continue midodrine, ^LFT's, mild > follow as op  01/13/13 Removal L thigh cath, placement R femoral diatek  02/11/13 New L thigh AVG placed  4/27-02/20/13 UGIB w CG emesis, acute on chronic hypotension, anemia > egd 4/20 w no bleeding lesion, rx'd with octreotide drip (hx varices) and ppi drip, stable at d/c, chronic hypotension Bp 90's on midodrine, chronic mental retardation, also hx SVC occlusion, mute, epilepsy. Characteristically will refuse meds and most meals when he is feeling better in the hospital but can be convinced to eat/ take pills sometimes by family  11/26-11/29/14 >> UGIB / hematemesis, lactic acidosis, similar to 4/14 admission >> EGD with no active bleeding source, +esoph and antral gastric erosions, Rx with octreotide and PPI drips. Question diagnosis of cirrhosis >> LFT's were normal and abd US of liver did not show signs of cirrhosis 12/14 - 10/08/13 >> presented with CG emesis, melena, low BP's, Hb 8.7.  Rec'd short course of AB for r/o sepsis but stopped then due to low suspicion.  ICU with a-line due to low BP's, transfused, PPI drip, pressors for hypotension, EGD by Dr Elnoria HowardHung  esoph ulcers w/o active bleeding.  Not explanation for GIB per GI. A-line removed and pressors stopped due to hx of low BP chronically.  Then pt refused to eat, refused blood draws and pills. Family said this was a typical  reaction to being in the hospital, and that once he was home he would start taking all his meds again.  Would not cooperate with PT /OT consults so pt eventually was dc'd home on PPI Rx.    Objective: Vital signs in last 24 hours: Temp:  [96.5 F (35.8 C)-98.7 F (37.1 C)] 98.5 F (36.9 C) (06/29 1129) Pulse Rate:  [57-100] 97 (06/29 1100) Resp:  [8-22] 17 (06/29 1100) BP: (82-114)/(36-72) 88/63 mmHg (06/29 1100) SpO2:  [95 %-100 %] 100 % (06/29 1100) Weight:  [60.5 kg (133 lb 6.1 oz)] 60.5 kg (133 lb 6.1 oz) (06/29 0600) Weight change: 1.079 kg (2 lb 6.1 oz)  Intake/Output from previous day: 06/28 0701 - 06/29 0700 In: 965.3 [I.V.:822.3; IV Piggyback:143] Out: 807  Intake/Output this shift: Total I/O In: 300.6 [I.V.:128.6; IV Piggyback:172] Out: -   Lab Results:  Recent Labs  04/16/14 0450 04/17/14 0400  WBC 7.9 8.0  HGB 8.2* 7.7*  HCT 24.1* 22.8*  PLT 52* 64*   BMET:  Recent Labs  04/16/14 1600 04/17/14 0400  NA 138 138  K 4.7 4.6  CL 101 101  CO2 27 25  GLUCOSE 116* 115*  BUN 7 8  CREATININE 1.47* 1.62*  CALCIUM 7.9* 8.3*  ALBUMIN 3.1* 3.3*   No results found for this basename: PTH,  in the last 72 hours Iron Studies: No results found for this basename: IRON, TIBC, TRANSFERRIN, FERRITIN,  in the last 72 hours  Studies/Results: No results found.  EXAM: General appearance:  Alert, non-verbal, in  no apparent distress Resp:  CTA without rales, rhonchi, or wheezes Cardio:  RRR without murmur or rub GI:  + BS, soft and nontender Extremities:  No edema Access:  R femoral catheter  Dialysis Orders:  [facility] 3:45    56 kg      2K/2Ca      400/A1.5      Profile 4     Heparin holding (3500 U at center)     L thigh AVG Hectorol 11 mcg     No Aranesp or Venofer  Assessment/Plan: 1. Hemorrhagic shock - Hb lowest 6.8, sec to blood loss with femoral artery injury during attempted repair of occluded R thigh AVG 6/22; using R femoral catheter now, on  vasopressors. 2. ESRD - CRRT started 6.22 , poorly functioning catheter, no other access options; K 4.6.   3. Chronic hypotension/Volume - BP 88/63, currently on pressors; outpatient Midodrine; wt 60.5 kg, CXR today showed new mild pulmonary edema. 4. Anemia - Hgb down to 7.7, no outpatient Aranesp or Fe. 5. Sec HPT - Ca 8.3 (8.9 corrected), P 2.1; Hectorol 11 mcg, Sensipar 30 mg bid, Renvela with meals. 6. Nutrition - Alb 3.3, renal diet, but refusing most food; multivitamin 7. Failed L thigh AVG- attempted revision on 6/22, now AVG abandoned. R thigh TDC placed on 6/23. Pt is near end-stage access. Occluded SVC.     LOS: 7 days   LYLES,CHARLES 04/17/2014,11:42 AM  Pt seen, examined, agree w assess/plan as above with additions as indicated. Pt has chronic hypotension and likely does not need pressors, keep SBP over 75 during weaning, will stop CRRT and plan HD later today when off of pressors. Also he often refuses meds and meals when in the hospital acc to family. TPA HD cath.  Vinson Moselleob Schertz MD pager [phone]    cell [phone] 04/17/2014, 12:28 PM

## 2014-04-17 NOTE — Progress Notes (Signed)
Per MD Schertz instruction, levophed weaned down to maintain a SBP>60 as long patient's mentation remained the same.  Patient's blood pressure decreased to SBP 50s. MD Schertz made aware.  Levophed increased.  Per MD Schertz, stop trying to wean down levophed and use levophed to maintain SBP > or = 85.

## 2014-04-17 NOTE — Progress Notes (Signed)
Multiple attempts made to give patient breakfast and morning medications.  Pt refusing at this time and falling asleep.  Will continue to monitor.

## 2014-04-17 NOTE — Progress Notes (Signed)
CRRT alarming and unable to run despite all efforts to troubleshoot and continue treatment.  Unable to draw back blood from either port of HD catheter; both ports flush, the blue (return) port more sluggish.  MD Darrick PennaFields and MD Schertz made aware.  CRRT stopped, catheter flushed and heparin locked per protocol.  Will continue to monitor.

## 2014-04-17 NOTE — Progress Notes (Signed)
[facility]  Subjective  - eating some   Objective 110/60 93 97.4 F (36.3 C) (Oral) 17 100%  Intake/Output Summary (Last 24 hours) at 04/17/14 0718 Last data filed at 04/17/14 0700  Gross per 24 hour  Intake  888.3 ml  Output    807 ml  Net   81.3 ml   Left groin healing no drainage No hematoma Abdomen soft non tender   Assessment/Planning: Still requiring pressors Anemia unknown etiology, not in his groin, does not really have reason for abdominal bleeding No evidence of GI bleeding Will send anemia panel Continue current plan No real access options catheter still marginal function  FIELDS,CHARLES E 04/17/2014 7:18 AM --  Laboratory Lab Results:  Recent Labs  04/16/14 0450 04/17/14 0400  WBC 7.9 8.0  HGB 8.2* 7.7*  HCT 24.1* 22.8*  PLT 52* 64*   BMET  Recent Labs  04/16/14 1600 04/17/14 0400  NA 138 138  K 4.7 4.6  CL 101 101  CO2 27 25  GLUCOSE 116* 115*  BUN 7 8  CREATININE 1.47* 1.62*  CALCIUM 7.9* 8.3*    COAG Lab Results  Component Value Date   INR 1.13 10/02/2013   INR 1.00 09/14/2013   INR 1.47 02/13/2013   No results found for this basename: PTT

## 2014-04-17 NOTE — Progress Notes (Signed)
PULMONARY / CRITICAL CARE MEDICINE  Name: [name] MRN: [id number] DOB: Jul 03, 1951    ADMISSION DATE:  04/10/2014 CONSULTATION DATE:  04/10/2014  REFERRING MD :  Darrick PennaFields PRIMARY SERVICE:  Vascular  CHIEF COMPLAINT:  Hypotension  BRIEF PATIENT DESCRIPTION: 63 yo baseline non verbal with ESRD on HD admitted for revision of thrombosed R AV graft. The procedure was complicated by injury to left superficial femoral artery requiring patch and significant blood loss.  Brought to PACU hypotensive, requiring vasopressors.   SIGNIFICANT EVENTS / STUDIES:  6/22  Revision of thrombosed AV graft complicated by arterial injury requiring patch, blood loss and hypotension  LINES / TUBES: R F HD cath 6/22 >>> 6/23 Tunneled R F HD cath 6/23 >>> L F TLC 6/23 >>>  CULTURES: None  ANTIBIOTICS: Cefazolin 6/22 x 1  INTERVAL HISTORY:  HD catheter no longer working.  Awake but not following commands.  VITAL SIGNS: Temp:  [96.4 F (35.8 C)-98.7 F (37.1 C)] 97.5 F (36.4 C) (06/29 0735) Pulse Rate:  [58-100] 89 (06/29 0930) Resp:  [8-22] 14 (06/29 0930) BP: (85-114)/(36-72) 94/72 mmHg (06/29 0930) SpO2:  [95 %-100 %] 100 % (06/29 0930) Weight:  [60.5 kg (133 lb 6.1 oz)] 60.5 kg (133 lb 6.1 oz) (06/29 0600)  HEMODYNAMICS:   VENTILATOR SETTINGS:   INTAKE / OUTPUT: Intake/Output     06/28 0701 - 06/29 0700 06/29 0701 - 06/30 0700   P.O.     I.V. (mL/kg) 822.3 (13.6) 64.3 (1.1)   Blood     IV Piggyback 143 86   Total Intake(mL/kg) 965.3 (16) 150.3 (2.5)   Other 807    Total Output 807     Net +158.3 +150.3          PHYSICAL EXAMINATION: General:  Resting comfortable Neuro:  Does not follow commands, non verbal HEENT:  PERRL Cardiovascular:  Regular, no rub Lungs:  Clear Abdomen:  Soft, nontender, bowel sounds diminished Musculoskeletal:  Moves all extremities Skin:  Intact  LABS: CBC  Recent Labs Lab 04/15/14 1252 04/16/14 0450 04/17/14 0400  WBC 5.9 7.9 8.0  HGB  8.7* 8.2* 7.7*  HCT 25.7* 24.1* 22.8*  PLT 42* 52* 64*   Coag's No results found for this basename: APTT, INR,  in the last 168 hours  BMET  Recent Labs Lab 04/16/14 0450 04/16/14 1600 04/17/14 0400  NA 140 138 138  K 5.0 4.7 4.6  CL 102 101 101  CO2 28 27 25   BUN 6 7 8   CREATININE 1.58* 1.47* 1.62*  GLUCOSE 88 116* 115*   Electrolytes  Recent Labs Lab 04/16/14 0450 04/16/14 1600 04/17/14 0400  CALCIUM 7.9* 7.9* 8.3*  PHOS 2.3 2.3 2.1*   Sepsis Markers  Recent Labs Lab 04/10/14 1824 04/10/14 2300 04/11/14 0513  LATICACIDVEN 5.4* 4.7* 5.5*   ABG No results found for this basename: PHART, PCO2ART, PO2ART,  in the last 168 hours Liver Enzymes  Recent Labs Lab 04/10/14 1602  04/16/14 0450 04/16/14 1600 04/17/14 0400  AST 15  --   --   --   --   ALT 11  --   --   --   --   ALKPHOS 97  --   --   --   --   BILITOT 0.3  --   --   --   --   ALBUMIN 3.2*  < > 3.2* 3.1* 3.3*  < > = values in this interval not displayed. Cardiac Enzymes  Recent Labs Lab  04/10/14 1824 04/10/14 2330 04/11/14 0513  TROPONINI <0.30 <0.30 <0.30   Glucose  Recent Labs Lab 04/16/14 1130 04/16/14 1520 04/16/14 1908 04/16/14 2353 04/17/14 0352 04/17/14 0732  GLUCAP 101* 99 103* 124* 109* 93   IMAGES: Dg Chest Port 1 View  04/17/2014   CLINICAL DATA:  Clinical pneumonitis -pneumonia  EXAM: PORTABLE CHEST - 1 VIEW  COMPARISON:  Portable chest x-ray of April 10, 2014  FINDINGS: The lungs are adequately inflated. The interstitial markings have increased bilaterally. The hemidiaphragms remain visible. There is no pleural effusion. The cardiac silhouette is top-normal in size. The central pulmonary vascularity is mildly prominent. The bony structures are unremarkable.  IMPRESSION: New mild pulmonary interstitial edema of cardiac or noncardiac cause.   Electronically Signed   By: David  SwazilandJordan   On: 04/17/2014 08:13   ASSESSMENT / PLAN:  PULMONARY A:   No active issues P:    - Goal SpO2>92 - Supplemental oxygen PRN  CARDIOVASCULAR A:  Chronic hypotension Shock ( hemorrhagic / hypovolemic / vasodilatory from general anesthesia; doubt septic ) Occluded L groin AV graft s/p attempted repair, femoral artery injury and patch Occluded R groin AV graft Chronically occluded SVC Difficult IV access P:  - Goal SBP>80 - Levophed gtt / Vasopressin gtt - Midodrine and Lipitor  RENAL A:   ESRD on HD Hyperkalemia P:   - Renal following and will address catheter today. - Trend BMP - CVVH while on pressors, if able to fix catheter. - Sensipar / Renvela   GASTROINTESTINAL A:   Nausea / vomiting post op, resolved GERD on PPI Concern for dysphagia P:   - Protonix - Reglan scheduled - Zofran PRN - Diet ordered (patient is refusing food however).  HEMATOLOGIC A:  Acute blood loss anemia Thrombocytopenia , HIT panel neg VTE Px P:  - Trend CBC, transfuse 1 u for Hb <7. - Avoid Heparin. - SCD.  INFECTIOUS A:   Doubt acute infection P:   - Monitor off abx.  ENDOCRINE  A:  Adrenal insufficiency ( cortisol 9 09/15/2013 ) Hyperglycemia, likely steroid induced P:   - Hydrocortisone 50 mg q6h. - SSI.  NEUROLOGIC A:   Baseline nonverbal History of seizures, none recent Post op pain P:   - Fentanyl PRN - Dilantin - Klonopin bid   GLOBAL - Will attempt to address catheter today, if unable then will likely transition to comfort care.  I have personally obtained history, examined patient, evaluated and interpreted laboratory and imaging results, reviewed medical records, formulated assessment / plan and placed orders.  CRITICAL CARE:  The patient is critically ill with multiple organ systems failure and requires high complexity decision making for assessment and support, frequent evaluation and titration of therapies, application of advanced monitoring technologies and extensive interpretation of multiple databases. Critical Care Time devoted to  patient care services described in this note is 35 minutes.   Alyson ReedyWesam G. [name], M.D. [facility]. Pager: [phone]. After hours pager: [phone].  04/17/2014, 10:07 AM

## 2014-04-17 NOTE — Progress Notes (Signed)
Per MD Schertz; TPA blue return port over night and resume HD tomorrow.  Will continue to monitor.

## 2014-04-18 LAB — GLUCOSE, CAPILLARY
GLUCOSE-CAPILLARY: 121 mg/dL — AB (ref 70–99)
GLUCOSE-CAPILLARY: 90 mg/dL (ref 70–99)
GLUCOSE-CAPILLARY: 163 mg/dL — AB (ref 70–99)
GLUCOSE-CAPILLARY: 159 mg/dL — AB (ref 70–99)
GLUCOSE-CAPILLARY: 85 mg/dL (ref 70–99)

## 2014-04-18 LAB — RENAL FUNCTION PANEL
ALBUMIN: 3.1 g/dL — AB (ref 3.5–5.2)
Albumin: 3.3 g/dL — ABNORMAL LOW (ref 3.5–5.2)
BUN: 22 mg/dL (ref 6–23)
BUN: 7 mg/dL (ref 6–23)
CALCIUM: 8.4 mg/dL (ref 8.4–10.5)
CALCIUM: 8.1 mg/dL — AB (ref 8.4–10.5)
CHLORIDE: 100 meq/L (ref 96–112)
CO2: 23 mEq/L (ref 19–32)
CO2: 29 meq/L (ref 19–32)
CREATININE: 1.81 mg/dL — AB (ref 0.50–1.35)
Chloride: 99 mEq/L (ref 96–112)
Creatinine, Ser: 4.01 mg/dL — ABNORMAL HIGH (ref 0.50–1.35)
GFR calc Af Amer: 17 mL/min — ABNORMAL LOW (ref >90)
GFR calc Af Amer: 44 mL/min — ABNORMAL LOW (ref >90)
GFR calc non Af Amer: 14 mL/min — ABNORMAL LOW (ref >90)
GFR calc non Af Amer: 38 mL/min — ABNORMAL LOW (ref >90)
GLUCOSE: 111 mg/dL — AB (ref 70–99)
GLUCOSE: 113 mg/dL — AB (ref 70–99)
POTASSIUM: 4.2 meq/L (ref 3.7–5.3)
Phosphorus: 4.3 mg/dL (ref 2.3–4.6)
Phosphorus: 2.4 mg/dL (ref 2.3–4.6)
Potassium: 3 mEq/L — ABNORMAL LOW (ref 3.7–5.3)
Sodium: 138 mEq/L (ref 137–147)
Sodium: 142 mEq/L (ref 137–147)

## 2014-04-18 MED ORDER — HEPARIN SODIUM (PORCINE) 1000 UNIT/ML DIALYSIS
1500.0000 [IU] | INTRAMUSCULAR | Status: DC | PRN
  Administered 2014-04-18: 1500 [IU] via INTRAVENOUS_CENTRAL

## 2014-04-18 MED ORDER — SODIUM CHLORIDE 0.9 % IV SOLN: 100.0000 mL | INTRAVENOUS | Status: DC | PRN

## 2014-04-18 MED ORDER — DARBEPOETIN ALFA-POLYSORBATE 60 MCG/0.3ML IJ SOLN
INTRAMUSCULAR | Status: AC
  Administered 2014-04-18: 60 ug via INTRAVENOUS
  Filled 2014-04-18: qty 0.3

## 2014-04-18 MED ORDER — PENTAFLUOROPROP-TETRAFLUOROETH EX AERO: 1.0000 "application " | INHALATION_SPRAY | CUTANEOUS | Status: DC | PRN

## 2014-04-18 MED ORDER — MIDODRINE HCL 5 MG PO TABS
10.0000 mg | ORAL_TABLET | Freq: Three times a day (TID) | ORAL | Status: DC
  Administered 2014-04-18 – 2014-04-27 (×26): 10 mg via ORAL
  Filled 2014-04-18 (×31): qty 2

## 2014-04-18 MED ORDER — DARBEPOETIN ALFA-POLYSORBATE 60 MCG/0.3ML IJ SOLN
60.0000 ug | INTRAMUSCULAR | Status: DC
  Administered 2014-04-18: 60 ug via INTRAVENOUS
  Filled 2014-04-18: qty 0.3

## 2014-04-18 MED ORDER — LIDOCAINE HCL (PF) 1 % IJ SOLN: 5.0000 mL | INTRAMUSCULAR | Status: DC | PRN

## 2014-04-18 MED ORDER — HEPARIN SODIUM (PORCINE) 1000 UNIT/ML DIALYSIS: 1000.0000 [IU] | INTRAMUSCULAR | Status: DC | PRN

## 2014-04-18 MED ORDER — LIDOCAINE-PRILOCAINE 2.5-2.5 % EX CREA
1.0000 "application " | TOPICAL_CREAM | CUTANEOUS | Status: DC | PRN
  Filled 2014-04-18: qty 5

## 2014-04-18 MED ORDER — ALTEPLASE 2 MG IJ SOLR
2.0000 mg | Freq: Once | INTRAMUSCULAR | Status: AC | PRN
  Filled 2014-04-18: qty 2

## 2014-04-18 MED ORDER — NEPRO/CARBSTEADY PO LIQD
237.0000 mL | ORAL | Status: DC | PRN
  Filled 2014-04-18: qty 237

## 2014-04-18 NOTE — Progress Notes (Signed)
Progress Note from the Palliative Medicine Team at [facility]  Subjective: [name] has received TPA for his HD catheter again yesterday and we are awaiting to see if this will improve and make catheter functional. SLP is present and attempting to assess swallowing but he is refusing food and drink - but eventually he accepted ensure pudding with apple chunks. He appears comfortable but slightly agitated at times.   This afternoon he is receiving HD but his levophed had to be increased to 10 mcg from 3 mcg to tolerate according to nursing. I have called [name] to update her on where we are and how his catheter is still problematic. We also discussed still being on vasopressors and the increased need during HD. [name] said she needed to call me back because she was with a client. I waited 25 minutes without call back - I will try again tomorrow.    Objective: No Known Allergies Scheduled Meds: . atorvastatin  20 mg Oral QHS  . cinacalcet  30 mg Oral BID WC  . clonazePAM  0.25 mg Oral BID  . darbepoetin (ARANESP) injection - DIALYSIS  60 mcg Intravenous Q Tue-HD  . feeding supplement (NEPRO CARB STEADY)  237 mL Oral BID BM  . hydrocortisone sodium succinate  50 mg Intravenous Q6H  . insulin aspart  0-15 Units Subcutaneous 6 times per day  . metoCLOPramide (REGLAN) injection  10 mg Intravenous 3 times per day  . midodrine  10 mg Oral TID WC  . multivitamin  1 tablet Oral QHS  . pantoprazole  40 mg Oral Q1200  . phenytoin (DILANTIN) IV  200 mg Intravenous QHS  . senna  1 tablet Oral BID  . sevelamer carbonate  800 mg Oral TID WC  . sodium chloride  10-40 mL Intracatheter Q12H   Continuous Infusions: . sodium chloride 10 mL/hr at 04/18/14 0700  . norepinephrine (LEVOPHED) Adult infusion 10 mcg/min (04/18/14 1400)  . dialysis replacement fluid (prismasate) 300 mL/hr at 04/16/14 1112  . dialysis replacement fluid (prismasate) 200 mL/hr at 04/17/14 0400  . dialysate (PRISMASATE) 2,000 mL/hr  at 04/17/14 0504  . vasopressin (PITRESSIN) infusion - *FOR SHOCK* Stopped (04/17/14 1200)   PRN Meds:.sodium chloride, sodium chloride, acetaminophen, acetaminophen, alteplase, bisacodyl, diphenhydrAMINE, feeding supplement (NEPRO CARB STEADY), fentaNYL, heparin, heparin, lidocaine (PF), lidocaine-prilocaine, ondansetron, pentafluoroprop-tetrafluoroeth, phenol, sodium chloride, sodium chloride  BP 95/53  Pulse 95  Temp(Src) 98 F (36.7 C) (Axillary)  Resp 14  Ht 5' (1.524 m)  Wt 62.3 kg (137 lb 5.6 oz)  BMI 26.82 kg/m2  SpO2 100%   PPS: 30%     Intake/Output Summary (Last 24 hours) at 04/18/14 1509 Last data filed at 04/18/14 1400  Gross per 24 hour  Intake  796.4 ml  Output      0 ml  Net  796.4 ml      LBM: 04/18/14      Physical Exam:  General: NAD, resting comfortably  HEENT: [place]/AT, no JVD, moist mucous membranes  Chest: CTA throughout, no labored breathing, symmetric  CVS: RRR, S1 S2  Abdomen: Soft, NT, ND, +BS  Ext: No edema, warm to touch  Neuro: Awake, alert, unable to assess orientation, able to follow some simple commands    Labs: CBC    Component Value Date/Time   WBC 8.0 04/17/2014 0400   RBC 2.30* 04/17/2014 0800   RBC 2.40* 04/17/2014 0400   HGB 7.7* 04/17/2014 0400   HCT 22.8* 04/17/2014 0400   PLT 64* 04/17/2014  0400   MCV 95.0 04/17/2014 0400   MCH 32.1 04/17/2014 0400   MCHC 33.8 04/17/2014 0400   RDW 15.2 04/17/2014 0400   LYMPHSABS 1.1 10/02/2013 0305   MONOABS 0.2 10/02/2013 0305   EOSABS 0.0 10/02/2013 0305   BASOSABS 0.0 10/02/2013 0305    BMET    Component Value Date/Time   NA 138 04/18/2014 0420   K 4.2 04/18/2014 0420   CL 99 04/18/2014 0420   CO2 23 04/18/2014 0420   GLUCOSE 111* 04/18/2014 0420   BUN 22 04/18/2014 0420   CREATININE 4.01* 04/18/2014 0420   CALCIUM 8.4 04/18/2014 0420   GFRNONAA 14* 04/18/2014 0420   GFRAA 17* 04/18/2014 0420    CMP     Component Value Date/Time   NA 138 04/18/2014 0420   K 4.2 04/18/2014 0420   CL 99  04/18/2014 0420   CO2 23 04/18/2014 0420   GLUCOSE 111* 04/18/2014 0420   BUN 22 04/18/2014 0420   CREATININE 4.01* 04/18/2014 0420   CALCIUM 8.4 04/18/2014 0420   PROT 5.3* 04/10/2014 1602   ALBUMIN 3.1* 04/18/2014 0420   AST 15 04/10/2014 1602   ALT 11 04/10/2014 1602   ALKPHOS 97 04/10/2014 1602   BILITOT 0.3 04/10/2014 1602   GFRNONAA 14* 04/18/2014 0420   GFRAA 17* 04/18/2014 0420    Assessment and Plan: 1. Code Status: FULL 2. Symptom Control: 1. Pain: Fentanyl prn.  2. Bowel Regimen: Senakot scheduled. Dulcolax daily prn. 3. Fever: Acetaminophen prn.  3. Psycho/Social: Emotional support provided to patient and family.  4. Disposition: To be determined on outcomes.     Time In Time Out Total Time Spent with Patient Total Overall Time  1030 1100 20min 30min    Greater than 50%  of this time was spent counseling and coordinating care related to the above assessment and plan.  Yong ChannelAlicia Parker, NP Palliative Medicine Team Pager # [phone] (M-F 8a-5p) Team Phone # [phone] (Nights/Weekends)

## 2014-04-18 NOTE — Progress Notes (Signed)
  [facility] Progress Note  04/18/2014 11:06 AM 7 Days Post-Op  Subjective: Alert this morning. Sitting up in bed. Had first bowel movement since admission today. No melena or hematochezia.   Tmax 98.5 BP sys 50s-110s O2 97%   Filed Vitals:   04/18/14 1045  BP: 78/42  Pulse: 116  Temp:   Resp: 16    Physical Exam: Incisions:  Left groin incision clean and intact. Left thigh catheter in place.  Extremities: palpable DP pulses bilaterally  CBC    Component Value Date/Time   WBC 8.0 04/17/2014 0400   RBC 2.30* 04/17/2014 0800   RBC 2.40* 04/17/2014 0400   HGB 7.7* 04/17/2014 0400   HCT 22.8* 04/17/2014 0400   PLT 64* 04/17/2014 0400   MCV 95.0 04/17/2014 0400   MCH 32.1 04/17/2014 0400   MCHC 33.8 04/17/2014 0400   RDW 15.2 04/17/2014 0400   LYMPHSABS 1.1 10/02/2013 0305   MONOABS 0.2 10/02/2013 0305   EOSABS 0.0 10/02/2013 0305   BASOSABS 0.0 10/02/2013 0305    BMET    Component Value Date/Time   NA 138 04/18/2014 0420   K 4.2 04/18/2014 0420   CL 99 04/18/2014 0420   CO2 23 04/18/2014 0420   GLUCOSE 111* 04/18/2014 0420   BUN 22 04/18/2014 0420   CREATININE 4.01* 04/18/2014 0420   CALCIUM 8.4 04/18/2014 0420   GFRNONAA 14* 04/18/2014 0420   GFRAA 17* 04/18/2014 0420    INR    Component Value Date/Time   INR 1.13 10/02/2013 0305     Intake/Output Summary (Last 24 hours) at 04/18/14 1106 Last data filed at 04/18/14 1000  Gross per 24 hour  Intake 798.73 ml  Output      0 ml  Net 798.73 ml     Assessment:  63 y.o. male is s/p:  Patch angioplasty L SFA, thrombectomy left thigh graft, patch angioplasty left venous anastomosis, new interposition graft arterial limb 4mm PTFE 7 Days Post-Op  Placement of right femoral tunneled catheter 6 Days Post-Op  Plan: -Still requiring pressors.  -Creatinine elevated to 4.01 today. Per nephrology, will try HD today if BP will tolerate.  -Anemia of unknown etiology: Hgb 7.7 today. No melena or hematochezia  with BM today. Aranesp started today per nephrology.    Maris BergerKimberly Trinh, PA-C [facility] Office: [phone] Pager: [phone] 04/18/2014 11:06 AM

## 2014-04-18 NOTE — Evaluation (Signed)
Clinical/Bedside Swallow Evaluation Patient Details  Name: [name] MRN: [id number] Date of Birth: 12/23/1950  Today's Date: 04/18/2014 Time: 1040-1057 SLP Time Calculation (min): 17 min  Past Medical History:  Past Medical History  Diagnosis Date  . Heart murmur, systolic 08/10/2009  . Syncope 05/07/2009  . 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 Esophageal varices 11/07/2008  . Gastric ulcer 10/09    antral, with h pylori positive  . Congestive heart failure 03/06/2008  . Cellulitis and abscess of leg, except foot 03/06/2008  . Secondary hyperparathyroidism 02/02/2008  . Mute 02/02/2008  . Hyperlipidemia 02/02/2008  . Anemia 02/02/2008  . ESRD (end stage renal disease)     TTS hemodialysis  . GERD (gastroesophageal reflux disease)   . Hypertension 02/02/2008    in history  . Mental retardation 02/02/2008  . Mute   . Seizures     "non in a while at home". none in past year .  Marland Kitchen Complication of anesthesia     in the past BP has dropped    Past Surgical History:  Past Surgical History  Procedure Laterality Date  . Left forearm graft      for HD  . Arteriovenous graft placement  11/22/10    Right thigh AVG  . Thrombectomy and revision of arterioventous (av) goretex  graft    . Thrombectomy and revision of arterioventous (av) goretex  graft  10/22/2012    Procedure: THROMBECTOMY AND REVISION OF ARTERIOVENTOUS (AV) GORETEX  GRAFT;  Surgeon: Larina Earthly, MD;  Location: [facility] OR;  Service: Vascular;  Laterality: Right;  . Thrombectomy w/ embolectomy  11/10/2012    Procedure: THROMBECTOMY ARTERIOVENOUS GORE-TEX GRAFT;  Surgeon: Pryor Ochoa, MD;  Location: [facility] OR;  Service: Vascular;  Laterality: Right;  . Thrombectomy and revision of arterioventous (av) goretex  graft Right 12/08/2012    Procedure: THROMBECTOMY AND REVISION OF ARTERIOVENTOUS (AV) GORETEX  GRAFT right thigh;  Surgeon: Sherren Kerns, MD;  Location: [facility] OR;  Service: Vascular;  Laterality: Right;  Susie Cassette N/A 12/08/2012    Procedure: VENOGRAM;  Surgeon: Sherren Kerns, MD;  Location: [facility] OR;  Service: Vascular;  Laterality: N/A;  Intraoperative Central venogram  . Thrombectomy w/ embolectomy Right 12/12/2012    Procedure: THROMBECTOMY ARTERIOVENOUS GORE-TEX GRAFT;  Surgeon: Nada Libman, MD;  Location: [facility] OR;  Service: Vascular;  Laterality: Right;  . Insertion of dialysis catheter Left 12/14/2012    Procedure: INSERTION OF DIALYSIS CATHETER;  Surgeon: Nada Libman, MD;  Location: [facility] OR;  Service: Vascular;  Laterality: Left;  . Insertion of dialysis catheter Right 01/13/2013    Procedure: INSERTION OF DIALYSIS CATHETER;  Surgeon: Nada Libman, MD;  Location: [facility] OR;  Service: Vascular;  Laterality: Right;  . Removal of a dialysis catheter Left 01/13/2013    Procedure: REMOVAL OF A DIALYSIS CATHETER;  Surgeon: Nada Libman, MD;  Location: [facility] OR;  Service: Vascular;  Laterality: Left;  . Av fistula placement Left 02/11/2013    Procedure: INSERTION OF ARTERIOVENOUS (AV) GORE-TEX GRAFT THIGH;  Surgeon: Nada Libman, MD;  Location: [facility] OR;  Service: Vascular;  Laterality: Left;  using 6mm x 50cm Gore-Tex Vascular Graft  . Esophagogastroduodenoscopy N/A 02/16/2013    Procedure: ESOPHAGOGASTRODUODENOSCOPY (EGD);  Surgeon: Vertell Novak., MD;  Location: [facility] ENDOSCOPY;  Service: Endoscopy;  Laterality: N/A;  bedside  . Esophagogastroduodenoscopy N/A 09/14/2013    Procedure: ESOPHAGOGASTRODUODENOSCOPY (EGD);  Surgeon: Vertell Novak., MD;  Location[facility]  ENDOSCOPY;  Service: Endoscopy;  Laterality: N/A;  control of bleeding if needed  . Esophagogastroduodenoscopy N/A 10/03/2013    Procedure: ESOPHAGOGASTRODUODENOSCOPY (EGD);  Surgeon: Theda BelfastPatrick D Hung, MD;  [facility];  Service: Endoscopy;  Laterality: N/A;  Bedside  . Radiology with anesthesia Right 10/19/2013    Procedure: RADIOLOGY WITH ANESTHESIA;  Surgeon: Malachy MoanHeath McCullough, MD;  [facility];  Service: Radiology;   Laterality: Right;  . Radiology with anesthesia Left 12/28/2013    Procedure: RADIOLOGY WITH ANESTHESIA;  Surgeon: Reola CalkinsGlenn T Yamagata, MD;  [facility];  Service: Radiology;  Laterality: Left;  . Thrombectomy and revision of arterioventous (av) goretex  graft Left 01/25/2014    Procedure: THROMBECTOMY AND REVISION OF LEFT THIGH ARTERIOVENTOUS (AV) GORETEX  GRAFT WITH PATCH ANGIOPLASTY;  Surgeon: Pryor OchoaJames D Lawson, MD;  [facility];  Service: Vascular;  Laterality: Left;  . Thrombectomy and revision of arterioventous (av) goretex  graft Left 04/10/2014    Procedure: THROMBECTOMY AND REVISION OF ARTERIOVENTOUS (AV) GORETEX  GRAFT;  Surgeon: Sherren Kernsharles E Fields, MD;  [facility];  Service: Vascular;  Laterality: Left;  . Patch angioplasty Left 04/10/2014    Procedure: PATCH ANGIOPLASTY LEFT SFA;  Surgeon: Sherren Kernsharles E Fields, MD;  [facility];  Service: Vascular;  Laterality: Left;  . Insertion of dialysis catheter Bilateral 04/11/2014    Procedure: INSERTION OF DIALYSIS CATHETER RIGHT FEMORAL VEIN; INSERTION OF TRIPLE LUMEN LEFT FEMORAL VEIN CENTRAL LINE; REMOVAL OF DIALYSIS CATHETER IN RIGHT FEMORAL VEIN.;  Surgeon: Nada LibmanVance W Brabham, MD;  Location: [facility] OR;  Service: Vascular;  Laterality: Bilateral;   HPI:  [name] is a 63 yo male admitted with ESRD on hemodialysis but with severe vascular access problems. He has a right femoral tunneled HD catheter that is poorly functional and no other options for access to administer hemodialysis. PMH significant for mental retardation, mute, seizures, CHF, SVC syndrome, esophageal varices, gastric ulcer.    Assessment / Plan / Recommendation Clinical Impression  Pt demonstrates cognitive impairment impacting willingness to accept PO from unfamiliar caregivers. RN and MD concerned that pt has been coughing when given solids foods brought by family (burgers and fries). Pt was described to be impulsive. With SLP pt was observed to have adequate tolerance of purees,  but slow mastication and and transit of solids with some lingual pumping. He was very resistant to thin liquids but did accept one cup sip with oral holding, but no evidence of aspiration. Overall pt is likely have decreased safety with meals due to impulsivity and mild oral dysphagia. SLP will f/u at lunch time to observe pt again with more solid textures and self feeding, but expect that pts ability to adhere to comepensatory strategies or diet modification will be poor. May need to work directly with family, though they are not currently present.     Aspiration Risk  Moderate    Diet Recommendation Regular;Thin liquid   Liquid Administration via: Cup;Straw Medication Administration: Whole meds with puree Supervision: Staff to assist with self feeding;Trained caregiver to feed patient;Full supervision/cueing for compensatory strategies Postural Changes and/or Swallow Maneuvers: Seated upright 90 degrees    Other  Recommendations Oral Care Recommendations: Oral care BID Other Recommendations: Have oral suction available   Follow Up Recommendations  None    Frequency and Duration min 2x/week  1 week   Pertinent Vitals/Pain NA    SLP Swallow Goals     Swallow Study Prior Functional Status       General HPI:  [name] is a 63 yo male admitted with ESRD on hemodialysis but with severe vascular access problems. He has a right femoral tunneled HD catheter that is poorly functional and no other options for access to administer hemodialysis. PMH significant for mental retardation, mute, seizures, CHF, SVC syndrome, esophageal varices, gastric ulcer.  Type of Study: Bedside swallow evaluation Diet Prior to this Study: Regular;Thin liquids Temperature Spikes Noted: No Respiratory Status: Room air History of Recent Intubation: No Behavior/Cognition: Alert;Requires cueing;Doesn't follow directions;Uncooperative Oral Cavity - Dentition: Adequate natural dentition Self-Feeding Abilities:  Needs assist Patient Positioning: Upright in bed Baseline Vocal Quality:  (mute) Volitional Cough: Cognitively unable to elicit Volitional Swallow: Unable to elicit    Oral/Motor/Sensory Function Overall Oral Motor/Sensory Function: Other (comment) (does not follow commands)   Ice Chips     Thin Liquid Thin Liquid: Impaired Presentation: Cup Oral Phase Impairments: Impaired anterior to posterior transit Oral Phase Functional Implications: Oral holding;Prolonged oral transit Pharyngeal  Phase Impairments: Suspected delayed Swallow    Nectar Thick Nectar Thick Liquid: Not tested   Honey Thick Honey Thick Liquid: Not tested   Puree Puree: Within functional limits   Solid   GO    Solid: Impaired Presentation: Spoon Oral Phase Impairments: Reduced lingual movement/coordination;Impaired mastication Pharyngeal Phase Impairments: Suspected delayed Swallow      Harlon DittyBonnie DeBlois, MA CCC-SLP [phone]  [name], [name] 04/18/2014,11:03 AM

## 2014-04-18 NOTE — Progress Notes (Signed)
Speech Language Pathology Treatment: Dysphagia  Patient Details Name: [name] MRN: [id number] DOB: 09/23/1951 Today's Date: 04/18/2014 Time: 1330-1410 SLP Time Calculation (min): 40 min  Assessment / Plan / Recommendation Clinical Impression  SLP provided maximal contextual cueing to encourage pt to attempt POs. Sister also arrived and pt refused all attempts she mad to feed him. Discussed possibility of an esophageal dysphagia causing a cough response given sister's report that she has noticed he has been coughing more in the hospital. Pt is usually mobile, up to a chair for meals; laying in bed may be negatively impacting esophageal motility. Provided education to sister and RN regarding basic esophageal strategies, also downgraded diet to a mechanical soft to facilitate mastication. Will f/u in am attempt trials one more time.    HPI HPI: [name] is a 63 yo male admitted with ESRD on hemodialysis but with severe vascular access problems. He has a right femoral tunneled HD catheter that is poorly functional and no other options for access to administer hemodialysis. PMH significant for mental retardation, mute, seizures, CHF, SVC syndrome, esophageal varices, gastric ulcer.    Pertinent Vitals NA  SLP Plan  Continue with current plan of care    Recommendations Diet recommendations: Dysphagia 3 (mechanical soft);Thin liquid Liquids provided via: Straw Medication Administration: Whole meds with puree Supervision: Staff to assist with self feeding;Trained caregiver to feed patient;Full supervision/cueing for compensatory strategies Compensations: Slow rate;Small sips/bites;Follow solids with liquid Postural Changes and/or Swallow Maneuvers: Seated upright 90 degrees;Upright 30-60 min after meal              Oral Care Recommendations: Oral care BID Follow up Recommendations: None Plan: Continue with current plan of care    GO    [facility] LLCBonnie DeBlois, MA CCC-SLP [phone]  [name],  [name] 04/18/2014, 2:18 PM

## 2014-04-18 NOTE — Progress Notes (Signed)
Subjective:  Ate breakfast and took some meds including midodrine  Objective: Vital signs in last 24 hours: Temp:  [97.4 F (36.3 C)-98.5 F (36.9 C)] 97.8 F (36.6 C) (06/30 0800) Pulse Rate:  [57-127] 109 (06/30 0900) Resp:  [5-42] 13 (06/30 0830) BP: (50-103)/(31-63) 85/48 mmHg (06/30 0900) SpO2:  [87 %-100 %] 95 % (06/30 0900) Weight:  [60.9 kg (134 lb 4.2 oz)] 60.9 kg (134 lb 4.2 oz) (06/30 0500) Weight change: 0.4 kg (14.1 oz)  Intake/Output from previous day: 06/29 0701 - 06/30 0700 In: 924.2 [P.O.:30; I.V.:543.2; IV Piggyback:311] Out: -  Intake/Output this shift: Total I/O In: 53.8 [I.V.:53.8] Out: -   Lab Results:  Recent Labs  04/16/14 0450 04/17/14 0400  WBC 7.9 8.0  HGB 8.2* 7.7*  HCT 24.1* 22.8*  PLT 52* 64*   BMET:   Recent Labs  04/17/14 1830 04/18/14 0420  NA 138 138  K 3.8 4.2  CL 99 99  CO2 25 23  GLUCOSE 135* 111*  BUN 17 22  CREATININE 3.03* 4.01*  CALCIUM 8.1* 8.4  ALBUMIN 3.1* 3.1*   No results found for this basename: PTH,  in the last 72 hours Iron Studies:   Recent Labs  04/17/14 0800  IRON 61  TIBC 175*  FERRITIN 284    Studies/Results: Dg Chest Port 1 View  04/17/2014   CLINICAL DATA:  Clinical pneumonitis -pneumonia  EXAM: PORTABLE CHEST - 1 VIEW  COMPARISON:  Portable chest x-ray of April 10, 2014  FINDINGS: The lungs are adequately inflated. The interstitial markings have increased bilaterally. The hemidiaphragms remain visible. There is no pleural effusion. The cardiac silhouette is top-normal in size. The central pulmonary vascularity is mildly prominent. The bony structures are unremarkable.  IMPRESSION: New mild pulmonary interstitial edema of cardiac or noncardiac cause.   Electronically Signed   By: David  Martinique   On: 04/17/2014 08:13    EXAM: Alert, non-verbal, in no apparent distress, some facial edema CTA without rales, rhonchi, or wheezes RRR without murmur or rub Abd soft, + BS and nontender No LE  edema R femoral HD catheter in place  Dialysis Orders:  TTS @ [place] 3:45    56 kg      2K/2Ca      400/A1.5      Profile 4     Heparin holding due to fem artery injury (3500 U at center)     L thigh AVG Hectorol 11 mcg     No Aranesp or Venofer  Assessment/Plan:                                                                                         1. Shock- remains on pressors, unable to wean, BP dropped into 50's yesterday w attempt to wean.  Midodrine by mouth for element of chronic hypotension 2. Pulm edema- by CXR, not real symptomatic 3. Malfunctioning HD cath- cath TPA'd yesterday and then again the venous limb was TPA'd overnight.   4. Anemia due to CKD and ABL- due to fem artery injury during attempted R thigh AVG thrombectomy 5. Failed L thigh AVG- unsuccessful attempt to salvage AVG  on 6/22. R thigh TDC placed on 6/23   6. ESRD on HD 7. Chronic hypotension/volume- on midodrine, 4kg up by weights- Hgb down to 7.7, no outpatient Aranesp or Fe. 8. Sec HPT - Ca 8.3 (8.9 corrected), P 2.1; Hectorol 11 mcg, Sensipar 30 mg bid, Renvela with meals. 9. Nutrition - Alb 3.3, renal diet; multivitamin 10. Palliative care- PC team has met with family, poor QOL. This is last access, if this fails will go to hospice care   Plan- start Aranesp, attempt HD today if catheter will work and if BP will tolerate, UF 3 kg as tolerated. Prognosis is guarded  Kelly Splinter MD (pgr) [phone]    ([phone] 04/18/2014, 9:46 AM

## 2014-04-18 NOTE — Progress Notes (Signed)
PULMONARY / CRITICAL CARE MEDICINE  Name: [name] MRN: [id number] DOB: 1951/09/29    ADMISSION DATE:  04/10/2014 CONSULTATION DATE:  04/10/2014  REFERRING MD :  Darrick PennaFields PRIMARY SERVICE:  Vascular  CHIEF COMPLAINT:  Hypotension  BRIEF PATIENT DESCRIPTION: 63 yo baseline non verbal with ESRD on HD admitted for revision of thrombosed R AV graft. The procedure was complicated by injury to left superficial femoral artery requiring patch and significant blood loss.  Brought to PACU hypotensive, requiring vasopressors.   SIGNIFICANT EVENTS / STUDIES:  6/22  Revision of thrombosed AV graft complicated by arterial injury requiring patch, blood loss and hypotension  LINES / TUBES: R F HD cath 6/22 >>> 6/23 Tunneled R F HD cath 6/23 >>> L F TLC 6/23 >>>  CULTURES: None  ANTIBIOTICS: Cefazolin 6/22 x 1  INTERVAL HISTORY:  Catheter tPA yesterday, will remove tPA today and evaluate use of catheter to determine HD vs CVVH needs.  VITAL SIGNS: Temp:  [97.4 F (36.3 C)-98.5 F (36.9 C)] 97.8 F (36.6 C) (06/30 0800) Pulse Rate:  [57-127] 98 (06/30 0830) Resp:  [5-42] 13 (06/30 0830) BP: (50-103)/(31-72) 96/54 mmHg (06/30 0830) SpO2:  [87 %-100 %] 98 % (06/30 0830) Weight:  [134 lb 4.2 oz (60.9 kg)] 134 lb 4.2 oz (60.9 kg) (06/30 0500)  HEMODYNAMICS:   VENTILATOR SETTINGS:   INTAKE / OUTPUT: Intake/Output     06/29 0701 - 06/30 0700 06/30 0701 - 07/01 0700   P.O. 30    I.V. (mL/kg) 543.2 (8.9)    Other 40    IV Piggyback 311    Total Intake(mL/kg) 924.2 (15.2)    Other     Total Output       Net +924.2           PHYSICAL EXAMINATION: General: Resting comfortable Neuro: Does not follow commands, non verbal HEENT: PERRL Cardiovascular: Regular, no rub Lungs: Clear Abdomen: Soft, nontender, bowel sounds diminished Musculoskeletal:  Moves all extremities Skin:  Intact  LABS: CBC  Recent Labs Lab 04/15/14 1252 04/16/14 0450 04/17/14 0400  WBC 5.9 7.9 8.0  HGB  8.7* 8.2* 7.7*  HCT 25.7* 24.1* 22.8*  PLT 42* 52* 64*   Coag's No results found for this basename: APTT, INR,  in the last 168 hours  BMET  Recent Labs Lab 04/17/14 0400 04/17/14 1830 04/18/14 0420  NA 138 138 138  K 4.6 3.8 4.2  CL 101 99 99  CO2 25 25 23   BUN 8 17 22   CREATININE 1.62* 3.03* 4.01*  GLUCOSE 115* 135* 111*   Electrolytes  Recent Labs Lab 04/17/14 0400 04/17/14 1830 04/18/14 0420  CALCIUM 8.3* 8.1* 8.4  PHOS 2.1* 3.4 4.3   Sepsis Markers No results found for this basename: LATICACIDVEN, PROCALCITON, O2SATVEN,  in the last 168 hours  ABG No results found for this basename: PHART, PCO2ART, PO2ART,  in the last 168 hours Liver Enzymes  Recent Labs Lab 04/17/14 0400 04/17/14 1830 04/18/14 0420  ALBUMIN 3.3* 3.1* 3.1*   Cardiac Enzymes No results found for this basename: TROPONINI, PROBNP,  in the last 168 hours Glucose  Recent Labs Lab 04/17/14 1126 04/17/14 1527 04/17/14 2001 04/17/14 2312 04/18/14 0346 04/18/14 0816  GLUCAP 125* 82 124* 95 121* 90   IMAGES: Dg Chest Port 1 View  04/17/2014   CLINICAL DATA:  Clinical pneumonitis -pneumonia  EXAM: PORTABLE CHEST - 1 VIEW  COMPARISON:  Portable chest x-ray of April 10, 2014  FINDINGS: The lungs are adequately  inflated. The interstitial markings have increased bilaterally. The hemidiaphragms remain visible. There is no pleural effusion. The cardiac silhouette is top-normal in size. The central pulmonary vascularity is mildly prominent. The bony structures are unremarkable.  IMPRESSION: New mild pulmonary interstitial edema of cardiac or noncardiac cause.   Electronically Signed   By: David  SwazilandJordan   On: 04/17/2014 08:13   ASSESSMENT / PLAN:  PULMONARY A:   No active issues P:   - Goal SpO2>92 - Supplemental oxygen PRN  CARDIOVASCULAR A:  Chronic hypotension Shock ( hemorrhagic / hypovolemic / vasodilatory from general anesthesia; doubt septic ) Occluded L groin AV graft s/p  attempted repair, femoral artery injury and patch Occluded R groin AV graft Chronically occluded SVC Difficult IV access P:  - Goal SBP>80 - Levophed gtt / Vasopressin gtt - Midodrine and Lipitor - Increase midodrine to 10 mg TID  RENAL A:   ESRD on HD Hyperkalemia P:   - Renal following and will address catheter today. - Trend BMP - ? If CVVH will be tolerated today. - Sensipar / Renvela   GASTROINTESTINAL A:   Nausea / vomiting post op, resolved GERD on PPI Concern for dysphagia P:   - Protonix - Reglan scheduled - Zofran PRN - Diet as ordered.  HEMATOLOGIC A:  Acute blood loss anemia Thrombocytopenia , HIT panel neg VTE Px P:  - Trend CBC, transfuse 1 u for Hb <7. - Avoid Heparin. - SCD.  INFECTIOUS A:   Doubt acute infection P:   - Monitor off abx.  ENDOCRINE  A:  Adrenal insufficiency ( cortisol 9 09/15/2013 ) Hyperglycemia, likely steroid induced P:   - Hydrocortisone 50 mg q6h. - SSI.  NEUROLOGIC A:   Baseline nonverbal History of seizures, none recent Post op pain P:   - Fentanyl PRN - Dilantin - Klonopin bid   GLOBAL - Hopefully HD catheter situation will be addressed today, if unable then will likely transition to comfort care.  I have personally obtained history, examined patient, evaluated and interpreted laboratory and imaging results, reviewed medical records, formulated assessment / plan and placed orders.  CRITICAL CARE:  The patient is critically ill with multiple organ systems failure and requires high complexity decision making for assessment and support, frequent evaluation and titration of therapies, application of advanced monitoring technologies and extensive interpretation of multiple databases. Critical Care Time devoted to patient care services described in this note is 35 minutes.   Alyson ReedyWesam G. [name], M.D. [facility] Pulmonary/Critical Care Medicine. Pager: [phone]. After hours pager: [phone].  04/18/2014, 8:52  AM

## 2014-04-18 NOTE — Progress Notes (Addendum)
[facility] [place]  Subjective  - waved to me   Objective 111/58 88 98 F (36.7 C) (Axillary) 17 95%  Intake/Output Summary (Last 24 hours) at 04/18/14 1351 Last data filed at 04/18/14 1200  Gross per 24 hour  Intake 766.58 ml  Output      0 ml  Net 766.58 ml   Left groin healing 2+ DP pulse Still on pressors  Assessment/Planning: No significant change Continue current pressor wean Anemia panel suggests not blood loss anemia, iron and retic counts normal  FIELDS,CHARLES E 04/18/2014 1:51 PM --  Laboratory Lab Results:  Recent Labs  04/16/14 0450 04/17/14 0400  WBC 7.9 8.0  HGB 8.2* 7.7*  HCT 24.1* 22.8*  PLT 52* 64*   BMET  Recent Labs  04/17/14 1830 04/18/14 0420  NA 138 138  K 3.8 4.2  CL 99 99  CO2 25 23  GLUCOSE 135* 111*  BUN 17 22  CREATININE 3.03* 4.01*  CALCIUM 8.1* 8.4    COAG Lab Results  Component Value Date   INR 1.13 10/02/2013   INR 1.00 09/14/2013   INR 1.47 02/13/2013   No results found for this basename: PTT

## 2014-04-19 DIAGNOSIS — I959 Hypotension, unspecified: Secondary | ICD-10-CM | POA: Diagnosis not present

## 2014-04-19 DIAGNOSIS — R579 Shock, unspecified: Secondary | ICD-10-CM | POA: Diagnosis not present

## 2014-04-19 LAB — GLUCOSE, CAPILLARY
GLUCOSE-CAPILLARY: 150 mg/dL — AB (ref 70–99)
GLUCOSE-CAPILLARY: 163 mg/dL — AB (ref 70–99)
Glucose-Capillary: 99 mg/dL (ref 70–99)
Glucose-Capillary: 128 mg/dL — ABNORMAL HIGH (ref 70–99)
Glucose-Capillary: 105 mg/dL — ABNORMAL HIGH (ref 70–99)
Glucose-Capillary: 220 mg/dL — ABNORMAL HIGH (ref 70–99)
Glucose-Capillary: 146 mg/dL — ABNORMAL HIGH (ref 70–99)

## 2014-04-19 LAB — TYPE AND SCREEN
ABO/RH(D): B POS
Antibody Screen: NEGATIVE
UNIT DIVISION: 0
Unit division: 0

## 2014-04-19 LAB — CBC
HEMATOCRIT: 24.5 % — AB (ref 39.0–52.0)
HEMOGLOBIN: 8.3 g/dL — AB (ref 13.0–17.0)
MCH: 32.5 pg (ref 26.0–34.0)
MCHC: 33.9 g/dL (ref 30.0–36.0)
MCV: 96.1 fL (ref 78.0–100.0)
Platelets: 165 10*3/uL (ref 150–400)
RBC: 2.55 MIL/uL — AB (ref 4.22–5.81)
RDW: 15.7 % — ABNORMAL HIGH (ref 11.5–15.5)
WBC: 8.9 10*3/uL (ref 4.0–10.5)

## 2014-04-19 LAB — BASIC METABOLIC PANEL
BUN: 10 mg/dL (ref 6–23)
CHLORIDE: 97 meq/L (ref 96–112)
CO2: 29 mEq/L (ref 19–32)
Calcium: 8 mg/dL — ABNORMAL LOW (ref 8.4–10.5)
Creatinine, Ser: 3.1 mg/dL — ABNORMAL HIGH (ref 0.50–1.35)
GFR calc non Af Amer: 20 mL/min — ABNORMAL LOW (ref >90)
GFR, EST AFRICAN AMERICAN: 23 mL/min — AB (ref >90)
GLUCOSE: 160 mg/dL — AB (ref 70–99)
POTASSIUM: 3.6 meq/L — AB (ref 3.7–5.3)
SODIUM: 138 meq/L (ref 137–147)

## 2014-04-19 LAB — PHOSPHORUS: Phosphorus: 3.2 mg/dL (ref 2.3–4.6)

## 2014-04-19 LAB — MAGNESIUM: Magnesium: 2.2 mg/dL (ref 1.5–2.5)

## 2014-04-19 MED ORDER — PHENYTOIN SODIUM EXTENDED 100 MG PO CAPS
200.0000 mg | ORAL_CAPSULE | Freq: Every day | ORAL | Status: DC
  Administered 2014-04-19 – 2014-04-26 (×8): 200 mg via ORAL
  Filled 2014-04-19 (×10): qty 2

## 2014-04-19 MED ORDER — PNEUMOCOCCAL VAC POLYVALENT 25 MCG/0.5ML IJ INJ
0.5000 mL | INJECTION | INTRAMUSCULAR | Status: AC
  Administered 2014-04-25: 0.5 mL via INTRAMUSCULAR
  Filled 2014-04-19 (×2): qty 0.5

## 2014-04-19 NOTE — Progress Notes (Signed)
SLP Cancellation Note  Patient Details Name: [name] MRN: [id number] DOB: 09-Jun-1951   Cancelled treatment:       Reason Eval/Treat Not Completed: Other (comment). Pt sleeping. RN reports he did eat some soft pancakes at breakfast without coughing/choking. She agrees that softer diet is beneficial. Education completed with family yesterday. If pt tolerating current diet, no further SLP intervention needed at this time. Will sign off, please reorder if needed.   Harlon DittyBonnie Miku Udall, MA CCC-SLP [phone]  [name], Marquee Fuchs Caroline 04/19/2014, 11:45 AM

## 2014-04-19 NOTE — Progress Notes (Addendum)
NUTRITION FOLLOW UP  DOCUMENTATION CODES Per approved criteria  -Not Applicable   INTERVENTION:  If supportive care desired, recommend nutrition support initiation  Continue Nepro Shake po BID, each supplement provides 425 kcal and 19 grams protein RD to follow for nutrition care plan  NUTRITION DIAGNOSIS: Inadequate oral intake related to lethargy, poor appetite as evidenced by PO intake 0-25%, ongoing  Goal: Pt to meet >/= 90% of their estimated nutrition needs, unmet  Monitor:  PO & supplemental intake, goals of care, weight, labs, I/O's  ASSESSMENT: [name] baseline non-verbal with ESRD on HD admitted for revision of thrombosed R AV graft. The procedure was complicated by injury to left superficial femoral artery requiring patch and significant blood loss. Brought to PACU hypotensive, requiring vasopressors.  Patient s/p procedure 6/23: #1  Placement of left femoral vein ultrasound-guided triple-lumen catheter  #2  Removal of hemodialysis catheter  #3  Placement of right femoral tunneled dialysis catheter  Patient receiving HD therapy.  Currently on a Dys 3, thin liquid diet.  PO intake remains very poor.  Sometimes refusing meals.  Ate 25% this AM per Nurse Tech.  Palliative Care Team following.    Patient not meeting nutrition needs given HD.  ? goal of care regarding nutrition.  Height: Ht Readings from Last 1 Encounters:  04/10/14 5' (1.524 m)    Weight: Wt Readings from Last 1 Encounters:  04/19/14 131 lb 13.4 oz (59.8 kg)    BMI:  Body mass index is 25.75 kg/(m^2).  Estimated Nutritional Needs: Kcal: 1700-1900 Protein: 80-90 gm Fluid: 1.7-1.9 L  Skin: healed wound to coccyx  Diet Order: Dysphagia 3, thin liquids   Intake/Output Summary (Last 24 hours) at 04/19/14 1448 Last data filed at 04/19/14 1400  Gross per 24 hour  Intake 1282.15 ml  Output   2471 ml  Net -1188.85 ml    Labs:   Recent Labs Lab 04/18/14 0420 04/18/14 1810  04/19/14 0353  NA 138 142 138  K 4.2 3.0* 3.6*  CL 99 100 97  CO2 23 29 29   BUN 22 7 10   CREATININE 4.01* 1.81* 3.10*  CALCIUM 8.4 8.1* 8.0*  MG  --   --  2.2  PHOS 4.3 2.4 3.2  GLUCOSE 111* 113* 160*    CBG (last 3)   Recent Labs  04/19/14 0344 04/19/14 0713 04/19/14 1124  GLUCAP 150* 128* 163*    Scheduled Meds: . atorvastatin  20 mg Oral QHS  . cinacalcet  30 mg Oral BID WC  . clonazePAM  0.25 mg Oral BID  . darbepoetin (ARANESP) injection - DIALYSIS  60 mcg Intravenous Q Tue-HD  . feeding supplement (NEPRO CARB STEADY)  237 mL Oral BID BM  . hydrocortisone sodium succinate  50 mg Intravenous Q6H  . insulin aspart  0-15 Units Subcutaneous 6 times per day  . metoCLOPramide (REGLAN) injection  10 mg Intravenous 3 times per day  . midodrine  10 mg Oral TID WC  . multivitamin  1 tablet Oral QHS  . pantoprazole  40 mg Oral Q1200  . phenytoin  200 mg Oral QHS  . [START ON 04/20/2014] pneumococcal 23 valent vaccine  0.5 mL Intramuscular Tomorrow-1000  . senna  1 tablet Oral BID  . sevelamer carbonate  800 mg Oral TID WC  . sodium chloride  10-40 mL Intracatheter Q12H    Continuous Infusions: . sodium chloride 10 mL/hr at 04/19/14 0700  . norepinephrine (LEVOPHED) Adult infusion 4 mcg/min (04/19/14 1400)  .  vasopressin (PITRESSIN) infusion - *FOR SHOCK* Stopped (04/17/14 1200)    Past Medical History  Diagnosis Date  . Heart murmur, systolic 08/10/2009  . Syncope 05/07/2009  . 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 Esophageal varices 11/07/2008  . Gastric ulcer 10/09    antral, with h pylori positive  . Congestive heart failure 03/06/2008  . Cellulitis and abscess of leg, except foot 03/06/2008  . Secondary hyperparathyroidism 02/02/2008  . [place] 02/02/2008  . Hyperlipidemia 02/02/2008  . Anemia 02/02/2008  . ESRD (end stage renal disease)     TTS hemodialysis  . GERD (gastroesophageal reflux disease)   . Hypertension 02/02/2008    in history  . Mental  retardation 02/02/2008  . [place]   . Seizures     "non in a while at home". none in past year .  Marland Kitchen. Complication of anesthesia     in the past BP has dropped     Past Surgical History  Procedure Laterality Date  . Left forearm graft      for HD  . Arteriovenous graft placement  11/22/10    Right thigh AVG  . Thrombectomy and revision of arterioventous (av) goretex  graft    . Thrombectomy and revision of arterioventous (av) goretex  graft  10/22/2012    Procedure: THROMBECTOMY AND REVISION OF ARTERIOVENTOUS (AV) GORETEX  GRAFT;  Surgeon: Larina Earthlyodd F Early, MD;  [facility];  Service: Vascular;  Laterality: Right;  . Thrombectomy w/ embolectomy  11/10/2012    Procedure: THROMBECTOMY ARTERIOVENOUS GORE-TEX GRAFT;  Surgeon: Pryor OchoaJames D Lawson, MD;  [facility];  Service: Vascular;  Laterality: Right;  . Thrombectomy and revision of arterioventous (av) goretex  graft Right 12/08/2012    Procedure: THROMBECTOMY AND REVISION OF ARTERIOVENTOUS (AV) GORETEX  GRAFT right thigh;  Surgeon: Sherren Kernsharles E Fields, MD;  [facility];  Service: Vascular;  Laterality: Right;  Susie Cassette. Venogram N/A 12/08/2012    Procedure: VENOGRAM;  Surgeon: Sherren Kernsharles E Fields, MD;  [facility];  Service: Vascular;  Laterality: N/A;  Intraoperative Central venogram  . Thrombectomy w/ embolectomy Right 12/12/2012    Procedure: THROMBECTOMY ARTERIOVENOUS GORE-TEX GRAFT;  Surgeon: Nada LibmanVance W Brabham, MD;  [facility];  Service: Vascular;  Laterality: Right;  . Insertion of dialysis catheter Left 12/14/2012    Procedure: INSERTION OF DIALYSIS CATHETER;  Surgeon: Nada LibmanVance W Brabham, MD;  [facility];  Service: Vascular;  Laterality: Left;  . Insertion of dialysis catheter Right 01/13/2013    Procedure: INSERTION OF DIALYSIS CATHETER;  Surgeon: Nada LibmanVance W Brabham, MD;  [facility];  Service: Vascular;  Laterality: Right;  . Removal of a dialysis catheter Left 01/13/2013    Procedure: REMOVAL OF A DIALYSIS CATHETER;  Surgeon: Nada LibmanVance W Brabham,  MD;  [facility];  Service: Vascular;  Laterality: Left;  . Av fistula placement Left 02/11/2013    Procedure: INSERTION OF ARTERIOVENOUS (AV) GORE-TEX GRAFT THIGH;  Surgeon: Nada LibmanVance W Brabham, MD;  Location: [facility] OR;  Service: Vascular;  Laterality: Left;  using 6mm x 50cm Gore-Tex Vascular Graft  . Esophagogastroduodenoscopy N/A 02/16/2013    Procedure: ESOPHAGOGASTRODUODENOSCOPY (EGD);  Surgeon: Vertell NovakJames L Edwards Jr., MD;  [facility];  Service: Endoscopy;  Laterality: N/A;  bedside  . Esophagogastroduodenoscopy N/A 09/14/2013    Procedure: ESOPHAGOGASTRODUODENOSCOPY (EGD);  Surgeon: Vertell NovakJames L Edwards Jr., MD;  [facility];  Service: Endoscopy;  Laterality: N/A;  control of bleeding if needed  . Esophagogastroduodenoscopy N/A 10/03/2013    Procedure: ESOPHAGOGASTRODUODENOSCOPY (  EGD);  Surgeon: Theda Belfast, MD;  Location: [facility] ENDOSCOPY;  Service: Endoscopy;  Laterality: N/A;  Bedside  . Radiology with anesthesia Right 10/19/2013    Procedure: RADIOLOGY WITH ANESTHESIA;  Surgeon: Malachy Moan, MD;  Location: [facility] OR;  Service: Radiology;  Laterality: Right;  . Radiology with anesthesia Left 12/28/2013    Procedure: RADIOLOGY WITH ANESTHESIA;  Surgeon: Reola Calkins, MD;  Location: [facility] OR;  Service: Radiology;  Laterality: Left;  . Thrombectomy and revision of arterioventous (av) goretex  graft Left 01/25/2014    Procedure: THROMBECTOMY AND REVISION OF LEFT THIGH ARTERIOVENTOUS (AV) GORETEX  GRAFT WITH PATCH ANGIOPLASTY;  Surgeon: Pryor Ochoa, MD;  [facility];  Service: Vascular;  Laterality: Left;  . Thrombectomy and revision of arterioventous (av) goretex  graft Left 04/10/2014    Procedure: THROMBECTOMY AND REVISION OF ARTERIOVENTOUS (AV) GORETEX  GRAFT;  Surgeon: Sherren Kerns, MD;  Location: [facility] OR;  Service: Vascular;  Laterality: Left;  . Patch angioplasty Left 04/10/2014    Procedure: PATCH ANGIOPLASTY LEFT SFA;  Surgeon: Sherren Kerns, MD;  Location: [facility] OR;   Service: Vascular;  Laterality: Left;  . Insertion of dialysis catheter Bilateral 04/11/2014    Procedure: INSERTION OF DIALYSIS CATHETER RIGHT FEMORAL VEIN; INSERTION OF TRIPLE LUMEN LEFT FEMORAL VEIN CENTRAL LINE; REMOVAL OF DIALYSIS CATHETER IN RIGHT FEMORAL VEIN.;  Surgeon: Nada Libman, MD;  [facility];  Service: Vascular;  Laterality: Bilateral;    Maureen Chatters, RD, LDN Pager #: [phone] After-Hours Pager #: [phone]

## 2014-04-19 NOTE — Progress Notes (Signed)
Subjective:  Ate breakfast and took some meds including midodrine, 2.4 kg off w HD yest  Objective: Vital signs in last 24 hours: Temp:  [97.5 F (36.4 C)-98.7 F (37.1 C)] 98.3 F (36.8 C) (07/01 0719) Pulse Rate:  [74-121] 94 (07/01 1000) Resp:  [0-31] 17 (07/01 1000) BP: (68-116)/(40-75) 91/52 mmHg (07/01 1000) SpO2:  [95 %-100 %] 97 % (07/01 1000) Weight:  [59.8 kg (131 lb 13.4 oz)-62.3 kg (137 lb 5.6 oz)] 59.8 kg (131 lb 13.4 oz) (07/01 0500) Weight change: 1.4 kg (3 lb 1.4 oz)  Intake/Output from previous day: 06/30 0701 - 07/01 0700 In: 1193.9 [P.O.:60; I.V.:1029.9; IV Piggyback:104] Out: 2471  Intake/Output this shift: Total I/O In: 228.8 [P.O.:120; I.V.:108.8] Out: -   Lab Results:  Recent Labs  04/17/14 0400 04/19/14 0353  WBC 8.0 8.9  HGB 7.7* 8.3*  HCT 22.8* 24.5*  PLT 64* 165   BMET:   Recent Labs  04/18/14 0420 04/18/14 1810 04/19/14 0353  NA 138 142 138  K 4.2 3.0* 3.6*  CL 99 100 97  CO2 23 29 29   GLUCOSE 111* 113* 160*  BUN 22 7 10   CREATININE 4.01* 1.81* 3.10*  CALCIUM 8.4 8.1* 8.0*  ALBUMIN 3.1* 3.3*  --    No results found for this basename: PTH,  in the last 72 hours Iron Studies:   Recent Labs  04/17/14 0800  IRON 61  TIBC 175*  FERRITIN 284    Studies/Results: No results found.  EXAM: Alert, non-verbal, in no apparent distress, some facial edema CTA without rales, rhonchi, or wheezes RRR without murmur or rub Abd soft, + BS and nontender No LE edema R femoral HD catheter in place  Dialysis Orders:  TT[facility] @ [facility] 3h 45min   56 kg    2K/2Ca   400/A1.5   P4  Heparin tight hep due to fem art injury (3500 U at center)  New R thigh TDC Hectorol 11 mcg     No Aranesp or Venofer  Assessment:                                                                                         1. Shock- improving, taking his midodrine now, BP up and pressors down 2. Malfunctioning HD cath- cath TPA'd x 2, worked OK 6/30 for HD  3. Anemia  due to CKD and ABL- due to fem artery injury/bleed, started aranesp 4. 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 R thigh TDC placed on 6/23   5. ESRD on HD 6. Chronic hypotension on midodrine 7. Volume- still up 3-4 kg by wts 8. HPTH cont meds 9. Nutrition - Alb 3.3, renal diet; multivitamin 10. Palliative care- PC team is working with family on EOL issues  Plan- HD tomorrow, UF as tolerated. Wean pressors   Vinson Moselleob Leandrea Ackley MD (pgr) [phone]    ([phone]) [phone] 04/19/2014, 11:22 AM

## 2014-04-19 NOTE — Progress Notes (Addendum)
  [facility] Progress Note  04/19/2014 10:12 AM 8 Days Post-Op  Subjective:  Sleeping-wakes to voice and stimulation  afebrile  Filed Vitals:   04/19/14 0800  BP: 113/60  Pulse: 99  Temp:   Resp: 13    Physical Exam: Incisions:  Left groin incision is c/d/i Extremities:  + palpable bilateral DP  CBC    Component Value Date/Time   WBC 8.9 04/19/2014 0353   RBC 2.55* 04/19/2014 0353   RBC 2.30* 04/17/2014 0800   HGB 8.3* 04/19/2014 0353   HCT 24.5* 04/19/2014 0353   PLT 165 04/19/2014 0353   MCV 96.1 04/19/2014 0353   MCH 32.5 04/19/2014 0353   MCHC 33.9 04/19/2014 0353   RDW 15.7* 04/19/2014 0353   LYMPHSABS 1.1 10/02/2013 0305   MONOABS 0.2 10/02/2013 0305   EOSABS 0.0 10/02/2013 0305   BASOSABS 0.0 10/02/2013 0305    BMET    Component Value Date/Time   NA 138 04/19/2014 0353   K 3.6* 04/19/2014 0353   CL 97 04/19/2014 0353   CO2 29 04/19/2014 0353   GLUCOSE 160* 04/19/2014 0353   BUN 10 04/19/2014 0353   CREATININE 3.10* 04/19/2014 0353   CALCIUM 8.0* 04/19/2014 0353   GFRNONAA 20* 04/19/2014 0353   GFRAA 23* 04/19/2014 0353    INR    Component Value Date/Time   INR 1.13 10/02/2013 0305     Intake/Output Summary (Last 24 hours) at 04/19/14 1012 Last data filed at 04/19/14 0900  Gross per 24 hour  Intake 1218.8 ml  Output   2471 ml  Net -1252.2 ml     Assessment:  63 y.o. male is s/p:  Patch angioplasty L SFA, thrombectomy left thigh graft, patch angioplasty left venous anastomosis, new interposition graft arterial limb 4mm PTFE  8 Days Post-Op  Plan: -pt's Levophed was increased yesterday for dialysis to 8010mcg-is down to 5mcg this am and pt is tolerating.  Tried to wean completely yesterday and systolic pressure to 50's.  Continue to try to wean -catheter working ok for dialysis at this point-last option for access -hgb improved today -DVT prophylaxis:  SCD's -appreciate palliative care assistance    Doreatha MassedSamantha Rhyne, PA-C [facility] [phone] 04/19/2014 10:12 AM   Awake and alert Still pressor dependent Incision healing left groin 2+ DP pulse  No real change in plan Need to try to get him out of bed  Fabienne Brunsharles Fields, MD [facility] [place] Office: [phone] Pager: [phone]

## 2014-04-19 NOTE — Progress Notes (Signed)
PULMONARY / CRITICAL CARE MEDICINE  Name: Rowe Clackeal W Helinski MRN: [id number] DOB: Mar 03, 1951    ADMISSION DATE:  04/10/2014 CONSULTATION DATE:  04/10/2014  REFERRING MD :  Darrick PennaFields PRIMARY SERVICE:  Vascular  CHIEF COMPLAINT:  Hypotension  BRIEF PATIENT DESCRIPTION: 63 yo baseline non verbal with ESRD on HD admitted for revision of thrombosed R AV graft. The procedure was complicated by injury to left superficial femoral artery requiring patch and significant blood loss.  Brought to PACU hypotensive, requiring vasopressors.   SIGNIFICANT EVENTS / STUDIES:  6/22  Revision of thrombosed AV graft complicated by arterial injury requiring patch, blood loss and hypotension  LINES / TUBES: R F HD cath 6/22 >>> 6/23 Tunneled R F HD cath 6/23 >>> L F TLC 6/23 >>>  CULTURES: None  ANTIBIOTICS: Cefazolin 6/22 x 1  INTERVAL HISTORY:  Required higher dose levophed (10 from 3 mcg) during HD yesterday, now dropping slowly.  VITAL SIGNS: Temp:  [97.5 F (36.4 C)-98.7 F (37.1 C)] 98.3 F (36.8 C) (07/01 0719) Pulse Rate:  [74-121] 94 (07/01 1000) Resp:  [0-31] 17 (07/01 1000) BP: (68-116)/(40-75) 91/52 mmHg (07/01 1000) SpO2:  [95 %-100 %] 97 % (07/01 1000) Weight:  [131 lb 13.4 oz (59.8 kg)-137 lb 5.6 oz (62.3 kg)] 131 lb 13.4 oz (59.8 kg) (07/01 0500)  HEMODYNAMICS:   VENTILATOR SETTINGS:   INTAKE / OUTPUT: Intake/Output     06/30 0701 - 07/01 0700 07/01 0701 - 07/02 0700   P.O. 60 120   I.V. (mL/kg) 1029.9 (17.2) 108.8 (1.8)   Other     IV Piggyback 104    Total Intake(mL/kg) 1193.9 (20) 228.8 (3.8)   Other 2471    Total Output 2471     Net -1277.1 +228.8        Stool Occurrence 4 x     PHYSICAL EXAMINATION: General: Resting comfortable Neuro: Does not follow commands, non verbal HEENT: PERRL Cardiovascular: Regular, no rub Lungs: Clear Abdomen: Soft, nontender, bowel sounds diminished Musculoskeletal:  Moves all extremities Skin:  Intact  LABS: CBC  Recent Labs Lab  04/16/14 0450 04/17/14 0400 04/19/14 0353  WBC 7.9 8.0 8.9  HGB 8.2* 7.7* 8.3*  HCT 24.1* 22.8* 24.5*  PLT 52* 64* 165   Coag's No results found for this basename: APTT, INR,  in the last 168 hours  BMET  Recent Labs Lab 04/18/14 0420 04/18/14 1810 04/19/14 0353  NA 138 142 138  K 4.2 3.0* 3.6*  CL 99 100 97  CO2 23 29 29   BUN 22 7 10   CREATININE 4.01* 1.81* 3.10*  GLUCOSE 111* 113* 160*   Electrolytes  Recent Labs Lab 04/18/14 0420 04/18/14 1810 04/19/14 0353  CALCIUM 8.4 8.1* 8.0*  MG  --   --  2.2  PHOS 4.3 2.4 3.2   Sepsis Markers No results found for this basename: LATICACIDVEN, PROCALCITON, O2SATVEN,  in the last 168 hours  ABG No results found for this basename: PHART, PCO2ART, PO2ART,  in the last 168 hours Liver Enzymes  Recent Labs Lab 04/17/14 1830 04/18/14 0420 04/18/14 1810  ALBUMIN 3.1* 3.1* 3.3*   Cardiac Enzymes No results found for this basename: TROPONINI, PROBNP,  in the last 168 hours Glucose  Recent Labs Lab 04/18/14 1146 04/18/14 1520 04/18/14 1926 04/18/14 2326 04/19/14 0344 04/19/14 0713  GLUCAP 163* 99 159* 85 150* 128*   IMAGES: No results found. ASSESSMENT / PLAN:  PULMONARY A:   No active issues P:   - Goal SpO2>92 -  Supplemental oxygen PRN  CARDIOVASCULAR A:  Chronic hypotension Shock (for unknown reason, would be helpful to have a PAC but no access possibilities here) Occluded L groin AV graft s/p attempted repair, femoral artery injury and patch Occluded R groin AV graft Chronically occluded SVC Difficult IV access P:  - Goal SBP>80 - Continue Levophed gtt / D/C Vasopressin gtt - Midodrine and Lipitor - Increased midodrine to 10 mg TID - Would be helpful to place a PAC but no access. - Will order echo, hopefully will shed some light, doubt septic shock however.  RENAL A:   ESRD on HD Hyperkalemia P:   - Renal following. - Trend BMP - ? If CVVH will be tolerated today. - Sensipar /  Renvela   GASTROINTESTINAL A:   Nausea / vomiting post op, resolved GERD on PPI Concern for dysphagia P:   - Protonix - Reglan scheduled - Zofran PRN - Diet as ordered.  HEMATOLOGIC A:  Acute blood loss anemia Thrombocytopenia , HIT panel neg VTE Px P:  - Trend CBC, transfuse 1 u for Hb <7. - Avoid Heparin. - SCD.  INFECTIOUS A:   Doubt acute infection P:   - Monitor off abx.  ENDOCRINE  A:  Adrenal insufficiency ( cortisol 9 09/15/2013 ) Hyperglycemia, likely steroid induced P:   - Hydrocortisone 50 mg q6h. - SSI.  NEUROLOGIC A:   Baseline nonverbal History of seizures, none recent Post op pain P:   - Fentanyl PRN - Dilantin - Klonopin bid   GLOBAL - Continue HD, palliative care attempting to make contact with family, will let them spearhead these efforts but ultimately the recommendations are DNR status.  I have personally obtained history, examined patient, evaluated and interpreted laboratory and imaging results, reviewed medical records, formulated assessment / plan and placed orders.  CRITICAL CARE:  The patient is critically ill with multiple organ systems failure and requires high complexity decision making for assessment and support, frequent evaluation and titration of therapies, application of advanced monitoring technologies and extensive interpretation of multiple databases. Critical Care Time devoted to patient care services described in this note is 35 minutes.   Alyson ReedyWesam G. [name], M.D. [facility]/Critical Care Medicine. Pager: [phone]. After hours pager: [phone].  04/19/2014, 11:21 AM

## 2014-04-20 DIAGNOSIS — I959 Hypotension, unspecified: Secondary | ICD-10-CM | POA: Diagnosis not present

## 2014-04-20 DIAGNOSIS — R569 Unspecified convulsions: Secondary | ICD-10-CM | POA: Diagnosis not present

## 2014-04-20 DIAGNOSIS — N186 End stage renal disease: Secondary | ICD-10-CM | POA: Diagnosis not present

## 2014-04-20 DIAGNOSIS — R579 Shock, unspecified: Secondary | ICD-10-CM | POA: Diagnosis not present

## 2014-04-20 LAB — RENAL FUNCTION PANEL
ALBUMIN: 3 g/dL — AB (ref 3.5–5.2)
ANION GAP: 15 (ref 5–15)
BUN: 24 mg/dL — AB (ref 6–23)
CHLORIDE: 95 meq/L — AB (ref 96–112)
CO2: 28 meq/L (ref 19–32)
Calcium: 7.9 mg/dL — ABNORMAL LOW (ref 8.4–10.5)
Creatinine, Ser: 6.21 mg/dL — ABNORMAL HIGH (ref 0.50–1.35)
GFR calc Af Amer: 10 mL/min — ABNORMAL LOW (ref >90)
GFR calc non Af Amer: 8 mL/min — ABNORMAL LOW (ref >90)
GLUCOSE: 129 mg/dL — AB (ref 70–99)
POTASSIUM: 3.8 meq/L (ref 3.7–5.3)
Phosphorus: 3.4 mg/dL (ref 2.3–4.6)
Sodium: 138 mEq/L (ref 137–147)

## 2014-04-20 LAB — CBC
HEMATOCRIT: 24.9 % — AB (ref 39.0–52.0)
Hemoglobin: 8.2 g/dL — ABNORMAL LOW (ref 13.0–17.0)
MCH: 31.9 pg (ref 26.0–34.0)
MCHC: 32.9 g/dL (ref 30.0–36.0)
MCV: 96.9 fL (ref 78.0–100.0)
Platelets: 231 10*3/uL (ref 150–400)
RBC: 2.57 MIL/uL — AB (ref 4.22–5.81)
RDW: 16.2 % — AB (ref 11.5–15.5)
WBC: 9.8 10*3/uL (ref 4.0–10.5)

## 2014-04-20 LAB — GLUCOSE, CAPILLARY
GLUCOSE-CAPILLARY: 174 mg/dL — AB (ref 70–99)
Glucose-Capillary: 115 mg/dL — ABNORMAL HIGH (ref 70–99)
Glucose-Capillary: 201 mg/dL — ABNORMAL HIGH (ref 70–99)
Glucose-Capillary: 135 mg/dL — ABNORMAL HIGH (ref 70–99)
Glucose-Capillary: 92 mg/dL (ref 70–99)

## 2014-04-20 MED ORDER — HEPARIN SODIUM (PORCINE) 1000 UNIT/ML DIALYSIS: 1000.0000 [IU] | INTRAMUSCULAR | Status: DC | PRN

## 2014-04-20 MED ORDER — LIDOCAINE HCL (PF) 1 % IJ SOLN: 5.0000 mL | INTRAMUSCULAR | Status: DC | PRN

## 2014-04-20 MED ORDER — HEPARIN SODIUM (PORCINE) 1000 UNIT/ML DIALYSIS
1500.0000 [IU] | INTRAMUSCULAR | Status: DC | PRN
  Administered 2014-04-20: 1500 [IU] via INTRAVENOUS_CENTRAL

## 2014-04-20 MED ORDER — NEPRO/CARBSTEADY PO LIQD
237.0000 mL | ORAL | Status: DC | PRN
  Filled 2014-04-20: qty 237

## 2014-04-20 MED ORDER — LIDOCAINE-PRILOCAINE 2.5-2.5 % EX CREA
1.0000 "application " | TOPICAL_CREAM | CUTANEOUS | Status: DC | PRN
  Filled 2014-04-20: qty 5

## 2014-04-20 MED ORDER — SODIUM CHLORIDE 0.9 % IV SOLN: 100.0000 mL | INTRAVENOUS | Status: DC | PRN

## 2014-04-20 MED ORDER — ALTEPLASE 2 MG IJ SOLR: 2.0000 mg | Freq: Once | INTRAMUSCULAR | Status: DC | PRN

## 2014-04-20 MED ORDER — PENTAFLUOROPROP-TETRAFLUOROETH EX AERO: 1.0000 "application " | INHALATION_SPRAY | CUTANEOUS | Status: DC | PRN

## 2014-04-20 NOTE — Progress Notes (Signed)
Subjective: doing OK, no verbal response, no problems overnight  Objective: Vital signs in last 24 hours: Temp:  [97.8 F (36.6 C)-99.7 F (37.6 C)] 97.8 F (36.6 C) (07/02 0724) Pulse Rate:  [77-120] 77 (07/02 0700) Resp:  [0-46] 12 (07/02 0700) BP: (57-127)/(27-111) 90/44 mmHg (07/02 0700) SpO2:  [91 %-100 %] 100 % (07/02 0700) Weight:  [60.3 kg (132 lb 15 oz)] 60.3 kg (132 lb 15 oz) (07/02 0600) Weight change: -2 kg (-4 lb 6.6 oz)  Intake/Output from previous day: 07/01 0701 - 07/02 0700 In: 1229.7 [P.O.:600; I.V.:629.7] Out: -  Intake/Output this shift: Total I/O In: 68.8 [I.V.:68.8] Out: -   Lab Results:  Recent Labs  04/19/14 0353  WBC 8.9  HGB 8.3*  HCT 24.5*  PLT 165   BMET:   Recent Labs  04/18/14 0420 04/18/14 1810 04/19/14 0353  NA 138 142 138  K 4.2 3.0* 3.6*  CL 99 100 97  CO2 23 29 29   GLUCOSE 111* 113* 160*  BUN 22 7 10   CREATININE 4.01* 1.81* 3.10*  CALCIUM 8.4 8.1* 8.0*  ALBUMIN 3.1* 3.3*  --    No results found for this basename: PTH,  in the last 72 hours Iron Studies:  No results found for this basename: IRON, TIBC, TRANSFERRIN, FERRITIN,  in the last 72 hours  Studies/Results: No results found.  EXAM: Alert, non-verbal, in no apparent distress, some facial edema CTA without rales, rhonchi, or wheezes RRR without murmur or rub Abd soft, + BS and nontender No LE edema R femoral HD catheter in place  Dialysis Orders:  [facility] @ [facility] 3h 45min   56 kg    2K/2Ca   400/A1.5   P4  Heparin tight hep due to fem art injury (3500 U at center)  New R thigh TDC Hectorol 11 mcg     No Aranesp or Venofer  Assessment:                                                                                         1. Shock- improving, taking all his home meds now which is helping greatly 2. Malfunctioning HD cath- required TPA but worked fine on 6/30 3. Anemia due to CKD / ABL- due to fem artery injury/bleed, started aranesp 4. Failed L thigh AVG-  unsuccessful attempt to salvage AVG on 6/22. R thigh TDC placed on 6/23   5. ESRD on HD 6. Chronic hypotension on midodrine- usual BP 90's, 80's while on HD 7. Volume- still up 3-4 kg by wts 8. HPTH cont meds 9. Nutrition - Alb 3.3, renal diet; multivitamin 10. Palliative care- PC team is working with family on EOL issues  Plan- HD today, will try to do upstairs if pressors can be weaned off  Vinson Moselleob Jesalyn Finazzo MD (pgr) [phone]    [phone] 04/20/2014, 9:59 AM

## 2014-04-20 NOTE — Progress Notes (Signed)
UR Completed.  [name] [phone] 04/20/2014  

## 2014-04-20 NOTE — Care Management Note (Signed)
    Page 1 of 1   04/20/2014     12:51:59 PM CARE MANAGEMENT NOTE 04/20/2014  Patient:  [name] [name] [name]   Account Number:  [id number]  Date Initiated:  04/14/2014  Documentation initiated by:  Avie ArenasBROWN,[name]  Subjective/Objective Assessment:   Non functioning - clotted off HD cath.     Action/Plan:   Anticipated DC Date:  04/18/2014   Anticipated DC Plan:  HOSPICE MEDICAL FACILITY      DC Planning Services  CM consult      Choice offered to / List presented to:             Status of service:  In process, will continue to follow Medicare Important Message given?  YES (If response is "NO", the following Medicare IM given date fields will be blank) Date Medicare IM given:  04/20/2014 Medicare IM given by:  Geneal Huebert Date Additional Medicare IM given:   Additional Medicare IM given by:    Discharge Disposition:    Per UR Regulation:  Reviewed for med. necessity/level of care/duration of stay  If discussed at Long Length of Stay Meetings, dates discussed:    Comments:  ContactMargaretmary [name]:  [name] [name] [phone]                  [facility] Center[facility]er,[name] [phone]  04/20/14 1100 Jonny Longino RN MSN BSN CCM Weaning levophed-pt to go to HD for dialysis today.  04-17-14 6:25am  Avie ArenasSarah [name], RNBSN [phone] Hospice consulted - sisters want to continue dialysis while sites last - back on pressors.  04-14-14 11:40am Avie ArenasSarah [name], RNBSN [phone] No dialysis access.  On CRRT now but access failing. Palliative consult placed.   Prior to admission was at home with caregivers.

## 2014-04-20 NOTE — Progress Notes (Signed)
PULMONARY / CRITICAL CARE MEDICINE  Name: [name] MRN: [id number] DOB: 1951-09-28    ADMISSION DATE:  04/10/2014 CONSULTATION DATE:  04/10/2014  REFERRING MD :  Darrick PennaFields PRIMARY SERVICE:  Vascular  CHIEF COMPLAINT:  Hypotension  BRIEF PATIENT DESCRIPTION: 63 yo baseline non verbal with ESRD on HD admitted for revision of thrombosed R AV graft. The procedure was complicated by injury to left superficial femoral artery requiring patch and significant blood loss.  Brought to PACU hypotensive, requiring vasopressors.   SIGNIFICANT EVENTS / STUDIES:  6/22  Revision of thrombosed AV graft complicated by arterial injury requiring patch, blood loss and hypotension  LINES / TUBES: R F HD cath 6/22 >>> 6/23 Tunneled R F HD cath 6/23 >>> L F TLC 6/23 >>>  CULTURES: None  ANTIBIOTICS: Cefazolin 6/22 x 1  INTERVAL HISTORY:  Pressor demand dropping overnight, down to 4 mcg.  VITAL SIGNS: Temp:  [97.8 F (36.6 C)-99.7 F (37.6 C)] 97.8 F (36.6 C) (07/02 0724) Pulse Rate:  [77-120] 77 (07/02 0700) Resp:  [0-46] 12 (07/02 0700) BP: (57-127)/(27-111) 90/44 mmHg (07/02 0700) SpO2:  [91 %-100 %] 100 % (07/02 0700) Weight:  [132 lb 15 oz (60.3 kg)] 132 lb 15 oz (60.3 kg) (07/02 0600)  HEMODYNAMICS:   VENTILATOR SETTINGS:   INTAKE / OUTPUT: Intake/Output     07/01 0701 - 07/02 0700 07/02 0701 - 07/03 0700   P.O. 600    I.V. (mL/kg) 629.7 (10.4) 68.8 (1.1)   IV Piggyback     Total Intake(mL/kg) 1229.7 (20.4) 68.8 (1.1)   Other     Total Output       Net +1229.7 +68.8        Stool Occurrence 1 x     PHYSICAL EXAMINATION: General: Resting comfortable Neuro: Does not follow commands, non verbal HEENT: PERRL Cardiovascular: Regular, no rub Lungs: Clear Abdomen: Soft, nontender, bowel sounds diminished Musculoskeletal:  Moves all extremities Skin:  Intact  LABS: CBC  Recent Labs Lab 04/16/14 0450 04/17/14 0400 04/19/14 0353  WBC 7.9 8.0 8.9  HGB 8.2* 7.7* 8.3*   HCT 24.1* 22.8* 24.5*  PLT 52* 64* 165   Coag's No results found for this basename: APTT, INR,  in the last 168 hours  BMET  Recent Labs Lab 04/18/14 0420 04/18/14 1810 04/19/14 0353  NA 138 142 138  K 4.2 3.0* 3.6*  CL 99 100 97  CO2 23 29 29   BUN 22 7 10   CREATININE 4.01* 1.81* 3.10*  GLUCOSE 111* 113* 160*   Electrolytes  Recent Labs Lab 04/18/14 0420 04/18/14 1810 04/19/14 0353  CALCIUM 8.4 8.1* 8.0*  MG  --   --  2.2  PHOS 4.3 2.4 3.2   Sepsis Markers No results found for this basename: LATICACIDVEN, PROCALCITON, O2SATVEN,  in the last 168 hours  ABG No results found for this basename: PHART, PCO2ART, PO2ART,  in the last 168 hours Liver Enzymes  Recent Labs Lab 04/17/14 1830 04/18/14 0420 04/18/14 1810  ALBUMIN 3.1* 3.1* 3.3*   Cardiac Enzymes No results found for this basename: TROPONINI, PROBNP,  in the last 168 hours Glucose  Recent Labs Lab 04/19/14 1124 04/19/14 1523 04/19/14 1916 04/19/14 2325 04/20/14 0353 04/20/14 0720  GLUCAP 163* 105* 220* 146* 115* 201*   IMAGES: No results found. ASSESSMENT / PLAN:  PULMONARY A:   No active issues P:   - Goal SpO2>92 - Supplemental oxygen PRN  CARDIOVASCULAR A:  Chronic hypotension Shock (for unknown reason, would be  helpful to have a PAC but no access possibilities here) Occluded L groin AV graft s/p attempted repair, femoral artery injury and patch Occluded R groin AV graft Chronically occluded SVC Difficult IV access No accepting his midodrine PO, chronically runs BP in the 90 range P:  - Goal SBP of 80 - Continue Levophed gtt / D/C Vasopressin gtt - Increased midodrine to 10 mg TID, now patient is willing to take it  RENAL A:   ESRD on HD Hyperkalemia P:   - Renal following. - Trend BMP - Will attempt HD in dialysis unit today. - Sensipar / Renvela   GASTROINTESTINAL A:   Nausea / vomiting post op, resolved GERD on PPI Concern for dysphagia P:   - Protonix -  Reglan scheduled - Zofran PRN - Diet as ordered.  HEMATOLOGIC A:  Acute blood loss anemia Thrombocytopenia , HIT panel neg VTE Px P:  - Trend CBC, transfuse 1 u for Hb <7. - Avoid Heparin. - SCD.  INFECTIOUS A:   Doubt acute infection P:   - Monitor off abx.  ENDOCRINE  A:  Adrenal insufficiency ( cortisol 9 09/15/2013 ) Hyperglycemia, likely steroid induced P:   - Hydrocortisone 50 mg q6h. - SSI.  NEUROLOGIC A:   Baseline nonverbal History of seizures, none recent Post op pain P:   - Fentanyl PRN - Dilantin - Klonopin bid   GLOBAL - Will accept SBP in the 80's, hopefully be able to get off pressors today and be able to dialyze in the dialysis unit.  Family still wishes for full code unless patient has no further access for HD.  I have personally obtained history, examined patient, evaluated and interpreted laboratory and imaging results, reviewed medical records, formulated assessment / plan and placed orders.  CRITICAL CARE:  The patient is critically ill with multiple organ systems failure and requires high complexity decision making for assessment and support, frequent evaluation and titration of therapies, application of advanced monitoring technologies and extensive interpretation of multiple databases. Critical Care Time devoted to patient care services described in this note is 35 minutes.   Alyson ReedyWesam G. [name], M.D. [facility]/Critical Care Medicine. Pager: [phone]. After hours pager: [phone].  04/20/2014, 10:04 AM

## 2014-04-20 NOTE — Progress Notes (Signed)
No family at bedside. Will wait to see how [name] does in dialysis and will attempt to speak with family tomorrow. They last told me that they desire full treatment and did not feel it was their place "to take any chances away from him." Willing to discuss options and decisions if this dialysis catheter fails. I have not been able to have further discussion with family since our initial meeting. They were planning to discuss options with their other siblings after initial consult meting. They have my contact information but have not contacted me so I assume there is no change in goals. Will attempt to further discuss tomorrow.  Yong ChannelAlicia Shann Merrick, NP Palliative Medicine Team Pager # [phone] (M-F 8a-5p) Team Phone # [phone] (Nights/Weekends)

## 2014-04-20 NOTE — Progress Notes (Signed)
Patient only eating ensure pudding and regular pudding from tray refuses to eat anything else

## 2014-04-20 NOTE — Progress Notes (Signed)
[facility]  Subjective  - POD 10   Objective 90/44 77 97.8 F (36.6 C) (Oral) 12 100%  Intake/Output Summary (Last 24 hours) at 04/20/14 16100812 Last data filed at 04/20/14 0600  Gross per 24 hour  Intake 1189.7 ml  Output      0 ml  Net 1189.7 ml   Left groin incision healing 2+ dp pulse left foot   Assessment/Planning: No real surgical issues at this point Groin incision essentially healed No further access sites Pressor dependent Will discuss with Renal service transfer to them as primary service.  FIELDS,CHARLES E 04/20/2014 8:12 AM --  Laboratory Lab Results:  Recent Labs  04/19/14 0353  WBC 8.9  HGB 8.3*  HCT 24.5*  PLT 165   BMET  Recent Labs  04/18/14 1810 04/19/14 0353  NA 142 138  K 3.0* 3.6*  CL 100 97  CO2 29 29  GLUCOSE 113* 160*  BUN 7 10  CREATININE 1.81* 3.10*  CALCIUM 8.1* 8.0*    COAG Lab Results  Component Value Date   INR 1.13 10/02/2013   INR 1.00 09/14/2013   INR 1.47 02/13/2013   No results found for this basename: PTT

## 2014-04-20 NOTE — Evaluation (Signed)
Physical Therapy Evaluation Patient Details Name: [name] MRN: [id number] DOB: 12/17/1950 Today's Date: 04/20/2014   History of Present Illness  63 yo baseline non verbal with ESRD on HD admitted for revision of thrombosed R AV graft. The procedure was complicated by injury to left superficial femoral artery requiring patch and significant blood loss. Pt with multiple problems with HD access.  Clinical Impression  Pt admitted with above. Pt currently with functional limitations due to the deficits listed below (see PT Problem List).  Pt will benefit from skilled PT to increase their independence and safety with mobility to allow discharge to the venue listed below.  Per nursing, Vascular, Critical Care and Nephrology all agreeable to OOB.     Follow Up Recommendations Home health PT;Supervision/Assistance - 24 hour    Equipment Recommendations  Other (comment) (To be assesed)    Recommendations for Other Services       Precautions / Restrictions Precautions Precautions: Fall      Mobility  Bed Mobility Overal bed mobility: Needs Assistance Bed Mobility: Supine to Sit     Supine to sit: +2 for physical assistance;Max assist     General bed mobility comments: Verbal/tactile cues for technique. Assist to bring legs over and elevate trunk.  Transfers Overall transfer level: Needs assistance Equipment used: 2 person hand held assist Transfers: Sit to/from [employer] Sit to Stand: +2 physical assistance;Mod assist Stand pivot transfers: +2 physical assistance;Mod assist       General transfer comment: Assist to bring hips and trunk up. Pt stepping around to chair but began sitting prior to being in middle of chair and hips pushed over to allow sitting in center of chair.  Ambulation/Gait                Stairs            Wheelchair Mobility    Modified Rankin (Stroke Patients Only)       Balance Overall balance assessment: Needs  assistance Sitting-balance support: Bilateral upper extremity supported;Feet supported Sitting balance-Leahy Scale: Poor Sitting balance - Comments: Sat EOB x 4 minutes with mod A initially and then min guard.   Standing balance support: Bilateral upper extremity supported Standing balance-Leahy Scale: Poor Standing balance comment: 2 person hand held assist.                             Pertinent Vitals/Pain VSS    Home Living Family/patient expects to be discharged to:: Private residence Living Arrangements: Other (Comment) (caregivers) Available Help at Discharge: Personal care attendant;Available 24 hours/day Type of Home: House         Home Equipment: Shower seat      Prior Function Level of Independence: Needs assistance   Gait / Transfers Assistance Needed: Per nurse family states pt has been amb at home.           Hand Dominance        Extremity/Trunk Assessment   Upper Extremity Assessment: Defer to OT evaluation           Lower Extremity Assessment: Generalized weakness         Communication   Communication: Expressive difficulties (Pt mute)  Cognition Arousal/Alertness: Awake/alert Behavior During Therapy: WFL for tasks assessed/performed Overall Cognitive Status: Difficult to assess                      General Comments  Exercises        Assessment/Plan    PT Assessment Patient needs continued PT services  PT Diagnosis Difficulty walking;Generalized weakness   PT Problem List Decreased strength;Decreased activity tolerance;Decreased balance;Decreased mobility;Decreased knowledge of use of DME;Decreased knowledge of precautions  PT Treatment Interventions DME instruction;Gait training;Functional mobility training;Therapeutic activities;Balance training;Patient/family education;Therapeutic exercise   PT Goals (Current goals can be found in the Care Plan section) Acute Rehab PT Goals Patient Stated Goal: Pt  mute PT Goal Formulation: Patient unable to participate in goal setting Time For Goal Achievement: 05/04/14 Potential to Achieve Goals: Good    Frequency Min 3X/week   Barriers to discharge        Co-evaluation               End of Session Equipment Utilized During Treatment: Gait belt Activity Tolerance: Patient tolerated treatment well Patient left: in chair;with call bell/phone within reach;with nursing/sitter in room (pt with sitter.) Nurse Communication: Mobility status         Time: 1013-1030 PT Time Calculation (min): 17 min   Charges:   PT Evaluation $Initial PT Evaluation Tier I: 1 Procedure PT Treatments $Gait Training: 8-22 mins   PT G Codes:          Claudie Brickhouse 2014/05/08, 2:30 PM  [facility] PT [phone]

## 2014-04-21 DIAGNOSIS — Z5189 Encounter for other specified aftercare: Secondary | ICD-10-CM | POA: Diagnosis not present

## 2014-04-21 DIAGNOSIS — R579 Shock, unspecified: Secondary | ICD-10-CM | POA: Diagnosis not present

## 2014-04-21 DIAGNOSIS — R4701 Aphasia: Secondary | ICD-10-CM | POA: Diagnosis not present

## 2014-04-21 DIAGNOSIS — N186 End stage renal disease: Secondary | ICD-10-CM | POA: Diagnosis not present

## 2014-04-21 LAB — BASIC METABOLIC PANEL
Anion gap: 13 (ref 5–15)
BUN: 9 mg/dL (ref 6–23)
CALCIUM: 8.1 mg/dL — AB (ref 8.4–10.5)
CO2: 29 mEq/L (ref 19–32)
CREATININE: 3.39 mg/dL — AB (ref 0.50–1.35)
Chloride: 98 mEq/L (ref 96–112)
GFR calc Af Amer: 20 mL/min — ABNORMAL LOW (ref >90)
GFR calc non Af Amer: 18 mL/min — ABNORMAL LOW (ref >90)
GLUCOSE: 126 mg/dL — AB (ref 70–99)
Potassium: 3.9 mEq/L (ref 3.7–5.3)
Sodium: 140 mEq/L (ref 137–147)

## 2014-04-21 LAB — CBC
HEMATOCRIT: 25 % — AB (ref 39.0–52.0)
HEMOGLOBIN: 8.2 g/dL — AB (ref 13.0–17.0)
MCH: 32.2 pg (ref 26.0–34.0)
MCHC: 32.8 g/dL (ref 30.0–36.0)
MCV: 98 fL (ref 78.0–100.0)
Platelets: 220 10*3/uL (ref 150–400)
RBC: 2.55 MIL/uL — ABNORMAL LOW (ref 4.22–5.81)
RDW: 16.5 % — ABNORMAL HIGH (ref 11.5–15.5)
WBC: 9.6 10*3/uL (ref 4.0–10.5)

## 2014-04-21 LAB — LACTIC ACID, PLASMA: Lactic Acid, Venous: 3 mmol/L — ABNORMAL HIGH (ref 0.5–2.2)

## 2014-04-21 LAB — GLUCOSE, CAPILLARY
GLUCOSE-CAPILLARY: 144 mg/dL — AB (ref 70–99)
Glucose-Capillary: 127 mg/dL — ABNORMAL HIGH (ref 70–99)
Glucose-Capillary: 115 mg/dL — ABNORMAL HIGH (ref 70–99)
Glucose-Capillary: 76 mg/dL (ref 70–99)
Glucose-Capillary: 123 mg/dL — ABNORMAL HIGH (ref 70–99)

## 2014-04-21 LAB — MAGNESIUM: MAGNESIUM: 2.2 mg/dL (ref 1.5–2.5)

## 2014-04-21 LAB — PHOSPHORUS: Phosphorus: 2 mg/dL — ABNORMAL LOW (ref 2.3–4.6)

## 2014-04-21 MED ORDER — ALTEPLASE 2 MG IJ SOLR
2.0000 mg | Freq: Once | INTRAMUSCULAR | Status: AC
  Administered 2014-04-21: 2 mg
  Filled 2014-04-21: qty 2

## 2014-04-21 MED ORDER — SODIUM CHLORIDE 0.9 % IV BOLUS (SEPSIS)
250.0000 mL | Freq: Once | INTRAVENOUS | Status: AC
  Administered 2014-04-21: 250 mL via INTRAVENOUS

## 2014-04-21 NOTE — Plan of Care (Signed)
Was called by Dr Arlean HoppingSchertz for a possible ICU transfer and taking over the care of the patient.   Patient who is followed by internal medicine teaching service, Dr. Manson PasseyBrown his PCP. Was admitted by vascular surgery after declotting of his vascular access resulted in bleeding complication and shock. The patient apparently chronically runs low blood pressure, currently with  blood pressure of 83/39 and on Levophed, vasopressin along with Solu-Cortef.   Plan was to get rid of vasopressors and then transfer the patient to the floor if he remains stable. Have requested Dr. Arlean HoppingSchertz to make sure patient is stable for the next few hours off of pressors. Thereafter internal medicine team can be called for transfer as they are the primary for the patient. Also call Dr. Kendrick FriesMcQuaid critical care to discuss patient's care.   Once patient is stable off of pressors, kindly get in touch with internal medicine teaching service. If not please page flow manager and get in touch with team 1.  Filed Vitals:   04/21/14 1215 04/21/14 1230 04/21/14 1245 04/21/14 1300  BP: 67/33 69/45 91/45  83/39  Pulse: 80 85 100 93  Temp:      TempSrc:      Resp: 12 14 20 18   Height:      Weight:      SpO2: 100% 100% 100% 99%

## 2014-04-21 NOTE — Progress Notes (Signed)
[facility] PCCM  [name] has been off of levophed for several hours now, during which time he worked with PT (up in chair, stood) and has remained alert and interactive at his baseline, eating, etc.  RN states that occasionally his SBP would drop into 70's but has otherwise been stable.  Will transfer to floor, IMTS.  Suspect he will be hypotensive on the floor as he always is.  Would advise nursing that a change in mental status or respiratory status would be a cause for alarm, but to suspect asymptomatic hypotension tonight.  PCCM to sign off as of 7/4 AM.  Heber CarolinaBrent McQuaid, MD [facility] Pager: [phone] Cell: [phone] If no response, call [phone]

## 2014-04-21 NOTE — Progress Notes (Signed)
Subjective: doing well, had to go back on pressors after HD yest  Objective: Vital signs in last 24 hours: Temp:  [97.3 F (36.3 C)-99.8 F (37.7 C)] 98 F (36.7 C) (07/03 0724) Pulse Rate:  [60-130] 105 (07/03 1015) Resp:  [10-28] 19 (07/03 1015) BP: (62-123)/(30-82) 86/44 mmHg (07/03 1015) SpO2:  [91 %-100 %] 99 % (07/03 1015) Weight:  [58.5 kg (128 lb 15.5 oz)-62 kg (136 lb 11 oz)] 58.5 kg (128 lb 15.5 oz) (07/03 0543) Weight change: 1.7 kg (3 lb 12 oz)  Intake/Output from previous day: 07/02 0701 - 07/03 0700 In: 7017 [P.O.:520; I.V.:419; IV Piggyback:500] Out: 2457  Intake/Output this shift: Total I/O In: 94.8 [I.V.:94.8] Out: -   Lab Results:  Recent Labs  04/20/14 1722 04/21/14 0410  WBC 9.8 9.6  HGB 8.2* 8.2*  HCT 24.9* 25.0*  PLT 231 220   BMET:   Recent Labs  04/18/14 1810  04/20/14 1722 04/21/14 0410  NA 142  < > 138 140  K 3.0*  < > 3.8 3.9  CL 100  < > 95* 98  CO2 29  < > 28 29  GLUCOSE 113*  < > 129* 126*  BUN 7  < > 24* 9  CREATININE 1.81*  < > 6.21* 3.39*  CALCIUM 8.1*  < > 7.9* 8.1*  ALBUMIN 3.3*  --  3.0*  --   < > = values in this interval not displayed. No results found for this basename: PTH,  in the last 72 hours Iron Studies:  No results found for this basename: IRON, TIBC, TRANSFERRIN, FERRITIN,  in the last 72 hours  Studies/Results: No results found.  EXAM: Alert, non-verbal, in no apparent distress, some facial edema CTA without rales, rhonchi, or wheezes RRR without murmur or rub Abd soft, + BS and nontender No LE edema R femoral HD catheter in place  Dialysis Orders:  TTS @ [facility] 3h 65mn   56 kg    2K/2Ca   400/A1.5   P4  Heparin tight hep due to fem art injury (3500 U at center)  New R thigh TDC Hectorol 11 mcg     No Aranesp or Venofer  Assessment:                                                                                         1. Shock- improved, would wean off pressors for BP over 70 and not restart, have  d/w primary MD 2. Malfunctioning HD cath- required TPA but worked fine on 6/30 3. Anemia due to CKD / ABL- due to fem artery injury/bleed, stable now, started aranesp 4. 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 R thigh TDC placed on 6/23   5. ESRD on HD 6. Chronic hypotension- on midodrine, usual BP 90's at highest  7. Volume- improved, 2.5kg up now 8. HPTH cont meds 9. Nutrition - Alb 3.3, renal diet; multivitamin 10. Palliative care- PC team has met with family about EOL issues 11. 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

## 2014-04-21 NOTE — Evaluation (Signed)
Occupational Therapy Evaluation Patient Details Name: [name] MRN: [id number] DOB: June 20, 1951 Today's Date: 04/21/2014    History of Present Illness 63 yo baseline non verbal with ESRD on HD admitted for revision of thrombosed R AV graft. The procedure was complicated by injury to left superficial femoral artery requiring patch and significant blood loss. Pt with multiple problems with HD access.   Clinical Impression   Patient is s/p Rt AV graft revision surgery resulting in functional limitations due to the deficits listed below (see OT problem list).  Patient will benefit from skilled OT acutely to increase independence and safety with ADLS to allow discharge SNF. Pt not following simple commands. Ot to follow acutely for adl retaining and cognitive recovery.     Follow Up Recommendations  SNF    Equipment Recommendations  None recommended by OT    Recommendations for Other Services       Precautions / Restrictions Precautions Precautions: Fall      Mobility Bed Mobility Overal bed mobility: Needs Assistance Bed Mobility: Supine to Sit;Sit to Supine     Supine to sit: Max assist;HOB elevated Sit to supine: +2 for physical assistance;Mod assist   General bed mobility comments: not following verbal cues. Pt very restless  Transfers Overall transfer level: Needs assistance Equipment used: 1 person hand held assist Transfers: Sit to/from [employer] Sit to Stand: Mod assist Stand pivot transfers: Mod assist       General transfer comment: pt requires (A) to anterior weight shift    Balance Overall balance assessment: Needs assistance Sitting-balance support: Bilateral upper extremity supported;Feet supported Sitting balance-Leahy Scale: Poor     Standing balance support: Bilateral upper extremity supported;During functional activity Standing balance-Leahy Scale: Poor                              ADL Overall ADL's : Needs  assistance/impaired         Upper Body Bathing: Total assistance   Lower Body Bathing: Total assistance                         General ADL Comments: Pt asleep on arrival and awakes with tactile input. pt very impulsive and attempting to get oob. pt transfered to chair. pt once in chair impulsively attempting to exit bed. pt not following simple commands. Pt required incr (A) back to bed     Vision                     Perception     Praxis      Pertinent Vitals/Pain BP monitored and stable map > 60 throughout session RN requesting SBP >70 at all times     Hand Dominance  (unable to determine)   Extremity/Trunk Assessment Upper Extremity Assessment Upper Extremity Assessment: Overall WFL for tasks assessed   Lower Extremity Assessment Lower Extremity Assessment: Defer to PT evaluation   Cervical / Trunk Assessment Cervical / Trunk Assessment: Normal   Communication Communication Communication: Other (comment) (non verbal )   Cognition Arousal/Alertness: Awake/alert Behavior During Therapy: Restless Overall Cognitive Status: No family/caregiver present to determine baseline cognitive functioning                     General Comments       Exercises       Shoulder Instructions      Home Living Family/patient  expects to be discharged to:: Private residence Living Arrangements: Other (Comment) Available Help at Discharge: Personal care attendant;Available 24 hours/day Type of Home: House                       Home Equipment: Shower seat   Additional Comments: all information obtained  from PT evaluation. Pt non verbal and no family present      Prior Functioning/Environment Level of Independence: Needs assistance  Gait / Transfers Assistance Needed: Per nurse family states pt has been amb at home.          OT Diagnosis: Generalized weakness;Cognitive deficits   OT Problem List: Decreased strength;Decreased  activity tolerance;Impaired balance (sitting and/or standing);Decreased cognition;Decreased safety awareness;Decreased knowledge of use of DME or AE;Decreased knowledge of precautions;Obesity;Cardiopulmonary status limiting activity   OT Treatment/Interventions: Self-care/ADL training;Therapeutic exercise;DME and/or AE instruction;Therapeutic activities;Cognitive remediation/compensation;Patient/family education;Balance training    OT Goals(Current goals can be found in the care plan section) Acute Rehab OT Goals Patient Stated Goal: Pt mute OT Goal Formulation: Patient unable to participate in goal setting Time For Goal Achievement: 05/05/14 Potential to Achieve Goals: Fair  OT Frequency: Min 2X/week   Barriers to D/C:            Co-evaluation              End of Session Nurse Communication: Mobility status;Precautions  Activity Tolerance: Patient tolerated treatment well Patient left: in bed;with call bell/phone within reach;with bed alarm set;with nursing/sitter in room   Time: 1100-1119 OT Time Calculation (min): 19 min Charges:  OT General Charges $OT Visit: 1 Procedure OT Evaluation $Initial OT Evaluation Tier I: 1 Procedure OT Treatments $Therapeutic Activity: 8-22 mins G-Codes:    Harolyn Rutherford 2014-05-18, 1:02 PM Pager: [phone]

## 2014-04-21 NOTE — Progress Notes (Signed)
Spoke with Dr. Eliott Nineunham regarding SBP 60s-70s. HR 80. Levo was not on when patient returned from HD, and is now currently on 8mcg. Will administer 250cc NS bolus at this time. Per MD, may given second bolus if BP remains low. Thresa RossA. Haggard RN

## 2014-04-21 NOTE — Progress Notes (Signed)
   Daily Progress Note  Assessment/Planning: POD #11 s/p Failed T&R L thigh AVG, POD #11 R fem TDC   Pt essential end-access except for secondary catheter (i.e. Translumbar, transhepatic)  Shock of unknown etiology continues.  Pt off levophed yesterday back on overnight after HD  Subjective  - 11 Days Post-Op  Minimally responsive  Objective Filed Vitals:   04/21/14 0645 04/21/14 0700 04/21/14 0724 04/21/14 0800  BP: 87/49 85/47  94/49  Pulse: 83 79  97  Temp:   98 F (36.7 C)   TempSrc:   Oral   Resp: 16 14  19   Height:      Weight:      SpO2: 94% 95%  96%    Intake/Output Summary (Last 24 hours) at 04/21/14 0841 Last data filed at 04/21/14 0800  Gross per 24 hour  Intake 1423.96 ml  Output   2457 ml  Net -1033.04 ml    PULM  CTAB CV  RRR GI  soft, NTND VASC  R TDC in place, L groin incision intact  Laboratory CBC    Component Value Date/Time   WBC 9.6 04/21/2014 0410   HGB 8.2* 04/21/2014 0410   HCT 25.0* 04/21/2014 0410   PLT 220 04/21/2014 0410    BMET    Component Value Date/Time   NA 140 04/21/2014 0410   K 3.9 04/21/2014 0410   CL 98 04/21/2014 0410   CO2 29 04/21/2014 0410   GLUCOSE 126* 04/21/2014 0410   BUN 9 04/21/2014 0410   CREATININE 3.39* 04/21/2014 0410   CALCIUM 8.1* 04/21/2014 0410   GFRNONAA 18* 04/21/2014 0410   GFRAA 20* 04/21/2014 0410    Leonides SakeBrian Demetrius Barrell, MD [facility] Office: [phone] Pager: [phone]  04/21/2014, 8:41 AM

## 2014-04-21 NOTE — Progress Notes (Signed)
I spoke with sister [name] over the phone. She is "hopeful" and "feels better" about how things are going but does acknowledge the issues with his blood pressure. No changes in goals. She has no further questions or concerns at this time. I have spoken with Dr. Phillips OdorGolding and asked her to speak with them this weekend if she is able since she is familiar with this patient and family.   [name] ChannelAlicia Daxon Kyne, NP Palliative Medicine Team Pager # [phone] (M-F 8a-5p) Team Phone # [phone] (Nights/Weekends)

## 2014-04-21 NOTE — Progress Notes (Signed)
Patient got up out of bed to the chair with PT this morning, but did not comply with being in the chair. Patient continuously tried to get up and move back to the bed, so patient was then placed back in the bed.   Levophed is being titrated to maintain a systolic BP of 70 or greater at this time. Will continue to monitor.

## 2014-04-21 NOTE — Progress Notes (Signed)
Received patient transfered from 2S, in stable condition.  SBP 92/44, P 103, R 15, sat 98, temp 98.2. Patient alert, calm, aware of surroundings.  Femoral triple lumen catheter in Rt thigh, HD catheter in Lt femoral. Both dressings intact and sites clean and dry.  Sacral decubitus covered by foam dressing. Not following verbal commands but held out arm to receive blood pressure cuff.  Placed in camera room and sitter present to protect lines.  Alik Mawson K

## 2014-04-21 NOTE — Progress Notes (Signed)
Received call in regards to transfer of care for [name] by Dr. Kendrick FriesMcQuaid. IMTS will resume care 7am 04/22/14.   Signed: Darden PalmerSamaya Modell Fendrick, MD PGY-2, Internal Medicine Resident Pager: [phone]  04/21/2014,4:41 PM

## 2014-04-21 NOTE — Progress Notes (Signed)
[facility]  Discussed with renal who knows him well Rapidly wean levophed now, then transfer to floor  [name] CarolinaBrent Eurika Sandy, MD [facility] Pager: [phone] Cell: [phone] If no response, call [phone]

## 2014-04-21 NOTE — Progress Notes (Addendum)
PULMONARY / CRITICAL CARE MEDICINE  Name: [name] MRN: [id number] DOB: [birth year]    ADMISSION DATE:  04/10/2014 CONSULTATION DATE:  04/10/2014  REFERRING MD :  Darrick PennaFields PRIMARY SERVICE:  Vascular  CHIEF COMPLAINT:  Hypotension  BRIEF PATIENT DESCRIPTION: 63 yo baseline non verbal with ESRD on HD admitted for revision of thrombosed R AV graft. The procedure was complicated by injury to left superficial femoral artery requiring patch and significant blood loss.  Brought to PACU hypotensive, requiring vasopressors.   SIGNIFICANT EVENTS / STUDIES:  6/22  Revision of thrombosed AV graft complicated by arterial injury requiring patch, blood loss and hypotension 6/23 R fem HD cath  7/2   LINES / TUBES: R F HD cath 6/22 >>> 6/23 Tunneled R F HD cath 6/23 >>> L F TLC 6/23 >>>  CULTURES: None  ANTIBIOTICS: Cefazolin 6/22 x 1  INTERVAL HISTORY:  HD in dialysis unit 7/2, removed 2.7 L, now on levophed again  VITAL SIGNS: Temp:  [97.3 F (36.3 C)-99.8 F (37.7 C)] 98 F (36.7 C) (07/03 0724) Pulse Rate:  [60-130] 97 (07/03 0800) Resp:  [10-27] 19 (07/03 0800) BP: (62-117)/(30-76) 94/49 mmHg (07/03 0800) SpO2:  [91 %-100 %] 96 % (07/03 0800) Weight:  [58.5 kg (128 lb 15.5 oz)-62 kg (136 lb 11 oz)] 58.5 kg (128 lb 15.5 oz) (07/03 0543)  HEMODYNAMICS:   VENTILATOR SETTINGS:   INTAKE / OUTPUT: Intake/Output     07/02 0701 - 07/03 0700 07/03 0701 - 07/04 0700   P.O. 520    I.V. (mL/kg) 419 (7.2) 28.8 (0.5)   IV Piggyback 500    Total Intake(mL/kg) 1439 (24.6) 28.8 (0.5)   Other 2457    Total Output 2457     Net -1018 +28.8         PHYSICAL EXAMINATION:  Gen: no acute distress HEENT: NCAT, PERRL, EOMi,  PULM: CTA B CV: RRR, no mgr, no JVD AB: BS+, soft, nontender, no hsm Ext: warm, no edema, no clubbing, no cyanosis Derm: groin CVL sites c/d/i Neuro: non-verbal, awake, does not follow commands for me, makes purposeful spontaneous movements with all four  ext   LABS: CBC  Recent Labs Lab 04/19/14 0353 04/20/14 1722 04/21/14 0410  WBC 8.9 9.8 9.6  HGB 8.3* 8.2* 8.2*  HCT 24.5* 24.9* 25.0*  PLT 165 231 220   Coag's No results found for this basename: APTT, INR,  in the last 168 hours  BMET  Recent Labs Lab 04/19/14 0353 04/20/14 1722 04/21/14 0410  NA 138 138 140  K 3.6* 3.8 3.9  CL 97 95* 98  CO2 29 28 29   BUN 10 24* 9  CREATININE 3.10* 6.21* 3.39*  GLUCOSE 160* 129* 126*   Electrolytes  Recent Labs Lab 04/19/14 0353 04/20/14 1722 04/21/14 0410  CALCIUM 8.0* 7.9* 8.1*  MG 2.2  --  2.2  PHOS 3.2 3.4 2.0*   Sepsis Markers No results found for this basename: LATICACIDVEN, PROCALCITON, O2SATVEN,  in the last 168 hours  ABG No results found for this basename: PHART, PCO2ART, PO2ART,  in the last 168 hours Liver Enzymes  Recent Labs Lab 04/18/14 0420 04/18/14 1810 04/20/14 1722  ALBUMIN 3.1* 3.3* 3.0*   Cardiac Enzymes No results found for this basename: TROPONINI, PROBNP,  in the last 168 hours Glucose  Recent Labs Lab 04/20/14 0720 04/20/14 1154 04/20/14 2114 04/20/14 2353 04/21/14 0332 04/21/14 0721  GLUCAP 201* 135* 92 174* 127* 115*   IMAGES: No results found. ASSESSMENT /  PLAN:  PULMONARY A:   No active issues P:   - Goal SpO2>92 - Supplemental oxygen PRN  CARDIOVASCULAR A:  Chronic hypotension > SBP chronically in 80 - 90 range Shock (adrenal insufficiency?, volume depletion post HD?) Occluded L groin AV graft s/p attempted repair, femoral artery injury and patch Occluded R groin AV graft Chronically occluded SVC Difficult IV access  P:  - contniue HC 50q6 - Goal SBP of 80 - wean levophed aggressively - continue midodrine to 10 mg TID  RENAL A:   ESRD on HD  Hyperkalemia resolved P:   - Renal following. - Trend BMP - HD per renal - Sensipar / Renvela   GASTROINTESTINAL A:   Nausea / vomiting post op, resolved GERD on PPI Concern for dysphagia P:   -  Protonix - Reglan scheduled - Zofran PRN - Diet as ordered.  HEMATOLOGIC A:  Acute blood loss anemia > stable Thrombocytopenia resolved VTE Px P:  - Trend CBC, transfuse 1 u for Hb <7. - Avoid Heparin. - SCD.  INFECTIOUS A:   Doubt acute infection P:   - Monitor off abx.  ENDOCRINE  A:  Adrenal insufficiency ( cortisol 9 09/15/2013 ) Hyperglycemia, likely steroid induced P:   - Hydrocortisone 50 mg q6h. - SSI.  NEUROLOGIC A:   Baseline nonverbal History of seizures, none recent Post op pain P:   - Fentanyl PRN - Dilantin - Klonopin bid   Global : Appreciate palliative care; I strongly question our aggressive resuscitation considering his baseline comorbid illnesses and limitations.    I have personally obtained history, examined patient, evaluated and interpreted laboratory and imaging results, reviewed medical records, formulated assessment / plan and placed orders.  CRITICAL CARE:  The patient is critically ill with multiple organ systems failure and requires high complexity decision making for assessment and support, frequent evaluation and titration of therapies, application of advanced monitoring technologies and extensive interpretation of multiple databases. Critical Care Time devoted to patient care services described in this note is 35 minutes.   Heber CarolinaBrent McQuaid, MD [facility] Pager: [phone] Cell: [phone] If no response, call [phone]   04/21/2014, 9:35 AM

## 2014-04-22 LAB — BASIC METABOLIC PANEL
Anion gap: 13 (ref 5–15)
BUN: 20 mg/dL (ref 6–23)
CO2: 28 mEq/L (ref 19–32)
Calcium: 8.2 mg/dL — ABNORMAL LOW (ref 8.4–10.5)
Chloride: 102 mEq/L (ref 96–112)
Creatinine, Ser: 6.07 mg/dL — ABNORMAL HIGH (ref 0.50–1.35)
GFR, EST AFRICAN AMERICAN: 10 mL/min — AB (ref >90)
GFR, EST NON AFRICAN AMERICAN: 9 mL/min — AB (ref >90)
Glucose, Bld: 116 mg/dL — ABNORMAL HIGH (ref 70–99)
POTASSIUM: 4.6 meq/L (ref 3.7–5.3)
SODIUM: 143 meq/L (ref 137–147)

## 2014-04-22 LAB — CBC
HEMATOCRIT: 22.9 % — AB (ref 39.0–52.0)
HEMOGLOBIN: 7.4 g/dL — AB (ref 13.0–17.0)
MCH: 32.5 pg (ref 26.0–34.0)
MCHC: 32.3 g/dL (ref 30.0–36.0)
MCV: 100.4 fL — AB (ref 78.0–100.0)
Platelets: 227 10*3/uL (ref 150–400)
RBC: 2.28 MIL/uL — ABNORMAL LOW (ref 4.22–5.81)
RDW: 17.3 % — ABNORMAL HIGH (ref 11.5–15.5)
WBC: 8.8 10*3/uL (ref 4.0–10.5)

## 2014-04-22 LAB — GLUCOSE, CAPILLARY
GLUCOSE-CAPILLARY: 131 mg/dL — AB (ref 70–99)
GLUCOSE-CAPILLARY: 72 mg/dL (ref 70–99)
Glucose-Capillary: 99 mg/dL (ref 70–99)
Glucose-Capillary: 221 mg/dL — ABNORMAL HIGH (ref 70–99)
Glucose-Capillary: 125 mg/dL — ABNORMAL HIGH (ref 70–99)

## 2014-04-22 MED ORDER — LIDOCAINE-PRILOCAINE 2.5-2.5 % EX CREA: 1.0000 "application " | TOPICAL_CREAM | CUTANEOUS | Status: DC | PRN

## 2014-04-22 MED ORDER — SODIUM CHLORIDE 0.9 % IV SOLN: 100.0000 mL | INTRAVENOUS | Status: DC | PRN

## 2014-04-22 MED ORDER — HEPARIN SODIUM (PORCINE) 1000 UNIT/ML DIALYSIS
1000.0000 [IU] | INTRAMUSCULAR | Status: DC | PRN
Start: 2014-04-22 — End: 2014-04-22

## 2014-04-22 MED ORDER — NEPRO/CARBSTEADY PO LIQD
237.0000 mL | ORAL | Status: DC | PRN
  Filled 2014-04-22: qty 237

## 2014-04-22 MED ORDER — HEPARIN SODIUM (PORCINE) 1000 UNIT/ML DIALYSIS: 1200.0000 [IU] | INTRAMUSCULAR | Status: DC | PRN

## 2014-04-22 MED ORDER — SODIUM CHLORIDE 0.9 % IV SOLN
100.0000 mL | INTRAVENOUS | Status: DC | PRN
Start: 2014-04-22 — End: 2014-04-22

## 2014-04-22 MED ORDER — ALTEPLASE 2 MG IJ SOLR: 2.0000 mg | Freq: Once | INTRAMUSCULAR | Status: DC | PRN

## 2014-04-22 MED ORDER — PENTAFLUOROPROP-TETRAFLUOROETH EX AERO: 1.0000 "application " | INHALATION_SPRAY | CUTANEOUS | Status: DC | PRN

## 2014-04-22 MED ORDER — LIDOCAINE HCL (PF) 1 % IJ SOLN: 5.0000 mL | INTRAMUSCULAR | Status: DC | PRN

## 2014-04-22 NOTE — Progress Notes (Signed)
Subjective: ICU Transfer Interval History  [name] is a [age] year old man with ESRD on HD, mental retardation (non-verbal), a history of seizures, chronic hypotension, and CHF transferring from the ICU to the IM teaching service. He underwent a thrombectomy and revision of a thrombosed L AV graft on 6/22 which was complicated by an femoral artery injury requiring patching with significant blood loss. He had a temporary catheter placed in the R groin which was replaced with permanent access in the R groin on 6/23. He was admitted to the ICU as he was hypotensive requiring pressors. He was on norepinephrine, vasopressin, hydrocortisone sodium succinate and midodrine 10 mg TID in the ICU. His condition has improved and he has been weaned off the vasopressin and norepinephrine. His SBP is usually 90's at highest. He has undergone his TTS HD schedule.   Objective: Vital signs in last 24 hours: Filed Vitals:   04/22/14 1119 04/22/14 1141 04/22/14 1218 04/22/14 1305  BP: 76/43 88/43 87/45  90/48  Pulse: 88 89 102 101  Temp:   97.6 F (36.4 C) 98 F (36.7 C)  TempSrc:   Oral Oral  Resp:   16 18  Height:      Weight:   129 lb 13.6 oz (58.9 kg)   SpO2:   96% 98%   Weight change: -7 lb 11.5 oz (-3.5 kg)  Intake/Output Summary (Last 24 hours) at 04/22/14 1705 Last data filed at 04/22/14 1459  Gross per 24 hour  Intake 507.17 ml  Output    802 ml  Net -294.83 ml   BP 90/48  Pulse 101  Temp(Src) 98 F (36.7 C) (Oral)  Resp 18  Ht 5' (1.524 m)  Wt 129 lb 13.6 oz (58.9 kg)  BMI 25.36 kg/m2  SpO2 98%  General Appearance:    Somnolent during dialysis,, non-responsive, appears comfortable  HEENT:    Normocephalic, without obvious abnormality, atraumatic, spontaneous eye movement with intermittent eye contact  Lungs:     Clear to auscultation bilaterally, respirations unlabored  Chest wall:    No tenderness or deformity  Heart:    Regular rate and rhythm, S1 and S2 normal, 2-3/6 blowing  systolic ejection murmur best heard at the apex  Genitalia:    Normal male without lesion or discharge  Extremities:   L groin with triple lumen catheter in place, scar tissue palpable, no thrill appreciated. R groin HD cath in place, no thrill appreciated.  Pulses:   2+ and symmetric all extremities   Lab Results: Basic Metabolic Panel:  Recent Labs  04/54/905/12/04 1722 04/21/14 0410 04/22/14 0810  NA 138 140 143  K 3.8 3.9 4.6  CL 95* 98 102  CO2 28 29 28   GLUCOSE 129* 126* 116*  BUN 24* 9 20  CREATININE 6.21* 3.39* 6.07*  CALCIUM 7.9* 8.1* 8.2*  MG  --  2.2  --   PHOS 3.4 2.0*  --    Liver Function Tests:  Recent Labs  04/20/14 1722  ALBUMIN 3.0*   No results found for this basename: LIPASE, AMYLASE,  in the last 72 hours No results found for this basename: AMMONIA,  in the last 72 hours  CBC:  Recent Labs  04/21/14 0410 04/22/14 0935  WBC 9.6 8.8  HGB 8.2* 7.4*  HCT 25.0* 22.9*  MCV 98.0 100.4*  PLT 220 227   Cardiac Enzymes: No results found for this basename: CKTOTAL, CKMB, CKMBINDEX, TROPONINI,  in the last 72 hours BNP: No results found for  this basename: PROBNP,  in the last 72 hours D-Dimer: No results found for this basename: DDIMER,  in the last 72 hours CBG:  Recent Labs  04/21/14 1542 04/21/14 1959 04/22/14 04/22/14 0405 04/22/14 1304 04/22/14 1609  GLUCAP 76 123* 131* 99 72 221*   Hemoglobin A1C: No results found for this basename: HGBA1C,  in the last 72 hours Fasting Lipid Panel: No results found for this basename: CHOL, HDL, LDLCALC, TRIG, CHOLHDL, LDLDIRECT,  in the last 72 hours Thyroid Function Tests: No results found for this basename: TSH, T4TOTAL, FREET4, T3FREE, THYROIDAB,  in the last 72 hours Anemia Panel: No results found for this basename: VITAMINB12, FOLATE, FERRITIN, TIBC, IRON, RETICCTPCT,  in the last 72 hours Coagulation: No results found for this basename: LABPROT, INR,  in the last 72 hours Urine Drug  Screen: Drugs of Abuse  No results found for this basename: labopia,  cocainscrnur,  labbenz,  amphetmu,  thcu,  labbarb    Alcohol Level: No results found for this basename: ETH,  in the last 72 hours Urinalysis: No results found for this basename: COLORURINE, APPERANCEUR, LABSPEC, PHURINE, GLUCOSEU, HGBUR, BILIRUBINUR, KETONESUR, PROTEINUR, UROBILINOGEN, NITRITE, LEUKOCYTESUR,  in the last 72 hours Misc. Labs: none  Micro Results: No results found for this or any previous visit (from the past 240 hour(s)). Studies/Results: No results found. Medications: I have reviewed the patient's current medications. Scheduled Meds: . atorvastatin  20 mg Oral QHS  . cinacalcet  30 mg Oral BID WC  . clonazePAM  0.25 mg Oral BID  . darbepoetin (ARANESP) injection - DIALYSIS  60 mcg Intravenous Q Tue-HD  . feeding supplement (NEPRO CARB STEADY)  237 mL Oral BID BM  . hydrocortisone sodium succinate  50 mg Intravenous Q6H  . insulin aspart  0-15 Units Subcutaneous 6 times per day  . metoCLOPramide (REGLAN) injection  10 mg Intravenous 3 times per day  . midodrine  10 mg Oral TID WC  . multivitamin  1 tablet Oral QHS  . pantoprazole  40 mg Oral Q1200  . phenytoin  200 mg Oral QHS  . pneumococcal 23 valent vaccine  0.5 mL Intramuscular Tomorrow-1000  . senna  1 tablet Oral BID  . sodium chloride  10-40 mL Intracatheter Q12H   Continuous Infusions: . sodium chloride 10 mL/hr at 04/21/14 2217  . vasopressin (PITRESSIN) infusion - *FOR SHOCK* Stopped (04/17/14 1200)   PRN Meds:.acetaminophen, acetaminophen, bisacodyl, diphenhydrAMINE, fentaNYL, ondansetron, phenol, sodium chloride Assessment/Plan: Active Problems:   Clotted renal dialysis arteriovenous graft   Palliative care encounter  1. Hypotension: BP has been 69-95/36-51 this morning which seems like his usual range. Likely due to adrenal insufficiency. -Goal SBP > 75, consider pressors if hypotensive below baseline -Continue midodrine,  hydrocortisone  2. ESRD on HD: Patient is tolerating HD via R groin catheter well. -Appreciate renal recs -Speak with palliative care regarding goals of care -TTS HD  3. Anemia: Hgb 7.4 down from 8.2, likely related to femoral artery bleed and chronic disease -continue to monitor  4. Adrenal insufficiency: Likely contributing to hypotension.  -cont hydrocortisone  5. Mental retardation: Patient appears at baseline from chart review. Non-verbal and non-engaging. He shook his head yes regardless of question.  Dispo: Disposition is deferred at this time, awaiting improvement of current medical problems.  Anticipated discharge in approximately 4 day(s).   The patient does have a current PCP Linward Headland, MD) and does need an [facility] hospital follow-up appointment after discharge.  The patient does  not know have transportation limitations that hinder transportation to clinic appointments.  .Services Needed at time of discharge: Y = Yes, Blank = No PT:   OT:   RN:   Equipment:   Other:     LOS: 12 days   Lorenda HatchetAdam L Kristy Schomburg, MD 04/22/2014, 5:05 PM

## 2014-04-22 NOTE — Progress Notes (Signed)
Pt goal changed to 2.5 and time extended to 4 hours per Dr. Arlean HoppingSchertz. SBP to stay >75 for UF to remain on. Pt alert, vss

## 2014-04-22 NOTE — Procedures (Signed)
I was present at this dialysis session, have reviewed the session itself and made  appropriate changes  Vinson Moselleob Dajai Wahlert MD (pgr) [phone]    [phone]) [phone] 04/22/2014, 11:45 AM

## 2014-04-22 NOTE — Progress Notes (Signed)
[facility] Progress Note  Assessment/Plan: 1. Shock- improved,  off pressors for BP again; and would not restart,Dr. Jonnie Finner d/w primary MD on 7/3 2. Malfunctioning HD cath- qb 400 7/4 3. Anemia due to CKD / ABLA- due to fem artery injury/bleed, stable now 8.2 - CBC pending, 60 Aranesp given 6/30 dose q Tuesday 4. 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 R thigh TDC placed on 6/23  5. ESRD on HD TTS labs pending 6. Chronic hypotension- on midodrine, usual BP 90's at highest - 80s today - have decreased dialysate temp to 36 7. Volume- improved still above edw with bedscales 8. HPTH cont meds P lopw at 2 on 7/3 - will stop renvela for now - renal pending 9. Nutrition - Alb 3.3, renal diet; multivitamin - nepro ordered between meals 10. Palliative care- PC team has met with family about EOL issues 11. 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 Doing better, approaching dry wt and otherwise stable from a renal standpoint. BP's are always low so this should not keep him in the hospital.   Kelly Splinter MD pager [phone]    cell [phone] 04/22/2014, 11:43 AM    Subjective:   nonverbal  Objective Filed Vitals:   04/22/14 0757 04/22/14 0802 04/22/14 0830 04/22/14 0838  BP: 95/47 81/36 92/48  86/39  Pulse: 99 97 96 88  Temp: 98 F (36.7 C)     TempSrc: Oral     Resp: 18     Height:      Weight: 59.8 kg (131 lb 13.4 oz)     SpO2: 98%      Physical Exam General: nonverbal NAD on HD Heart: RRR Lungs: no rales or wheezes Abdomen: soft NT Extremities: no LE edema Dialysis Access:  Right fem cath Qb 400 (also IV access left groin)  Dialysis Orders: [facility]  3h 1mn 56 kg 2K/2Ca 400/A1.5 P4 Heparin tight hep due to fem  art injury (3500 U at center) New R thigh TDC Hectorol 11 mcg No Aranesp or Venofer   Additional Objective Labs: Basic Metabolic Panel:  Recent Labs Lab 04/19/14 0353 04/20/14 1722 04/21/14 0410  NA 138 138 140  K 3.6* 3.8 3.9  CL 97 95* 98  CO2 29 28 29   GLUCOSE 160* 129* 126*  BUN 10 24* 9  CREATININE 3.10* 6.21* 3.39*  CALCIUM 8.0* 7.9* 8.1*  PHOS 3.2 3.4 2.0*   Liver Function Tests:  Recent Labs Lab 04/18/14 0420 04/18/14 1810 04/20/14 1722  ALBUMIN 3.1* 3.3* 3.0*   CBC:  Recent Labs Lab 04/16/14 0450 04/17/14 0400 04/19/14 0353 04/20/14 1722 04/21/14 0410  WBC 7.9 8.0 8.9 9.8 9.6  HGB 8.2* 7.7* 8.3* 8.2* 8.2*  HCT 24.1* 22.8* 24.5* 24.9* 25.0*  MCV 94.1 95.0 96.1 96.9 98.0  PLT 52* 64* 165 231 220   Blood Culture    Component Value Date/Time   SDES BLOOD LEFT HAND 10/02/2013 1028   SPECREQUEST BOTTLES DRAWN AEROBIC AND ANAEROBIC 10CC 10/02/2013 1028   CULT  Value: NO GROWTH 5 DAYS [facility]12/14/2014 1028   REPTSTATUS 10/08/2013 FINAL 10/02/2013 1028   Recent Labs Lab 04/21/14 1132 04/21/14 1542 04/21/14 1959 04/22/14 04/22/14 0405  GLUCAP 144* 76 123*  131* 99  Medications: . sodium chloride 10 mL/hr at 04/21/14 2217  . vasopressin (PITRESSIN) infusion - *FOR SHOCK* Stopped (04/17/14 1200)   . atorvastatin  20 mg Oral QHS  . cinacalcet  30 mg Oral BID WC  . clonazePAM  0.25 mg Oral BID  . darbepoetin (ARANESP) injection - DIALYSIS  60 mcg Intravenous Q Tue-HD  . feeding supplement (NEPRO CARB STEADY)  237 mL Oral BID BM  . hydrocortisone sodium succinate  50 mg Intravenous Q6H  . insulin aspart  0-15 Units Subcutaneous 6 times per day  . metoCLOPramide (REGLAN) injection  10 mg Intravenous 3 times per day  . midodrine  10 mg Oral TID WC  . multivitamin  1 tablet Oral QHS  . pantoprazole  40 mg Oral Q1200  . phenytoin  200 mg Oral QHS  . pneumococcal 23 valent vaccine  0.5 mL Intramuscular Tomorrow-1000  .  senna  1 tablet Oral BID  . sevelamer carbonate  800 mg Oral TID WC  . sodium chloride  10-40 mL Intracatheter Q12H

## 2014-04-22 NOTE — Progress Notes (Signed)
   Daily Progress Note  Assessment/Planning: POD #12 s/p Failed T&R L thigh AVG, POD #11 R fem TDC    Appreciate IM taking over this patient  Nothing more to offer from a vascular viewpoint  Subjective  - 11 Days Post-Op  No events[facility]  Objective Filed Vitals:   04/22/14 0838 04/22/14 0908 04/22/14 0939 04/22/14 0945  BP: 86/39 82/38 69/38  80/38  Pulse: 88 92 99 97  Temp:      TempSrc:      Resp:      Height:      Weight:      SpO2:        Intake/Output Summary (Last 24 hours) at 04/22/14 0953 Last data filed at 04/22/14 0900  Gross per 24 hour  Intake 224.75 ml  Output      0 ml  Net 224.75 ml   VASC  R fem TDC in place, L TLC in point, L groin incision healed  Laboratory CBC    Component Value Date/Time   WBC 9.6 04/21/2014 0410   HGB 8.2* 04/21/2014 0410   HCT 25.0* 04/21/2014 0410   PLT 220 04/21/2014 0410    BMET    Component Value Date/Time   NA 143 04/22/2014 0810   K 4.6 04/22/2014 0810   CL 102 04/22/2014 0810   CO2 28 04/22/2014 0810   GLUCOSE 116* 04/22/2014 0810   BUN 20 04/22/2014 0810   CREATININE 6.07* 04/22/2014 0810   CALCIUM 8.2* 04/22/2014 0810   GFRNONAA 9* 04/22/2014 0810   GFRAA 10* 04/22/2014 0810    Leonides SakeBrian Mathhew Buysse, MD [facility] [place] Office: [phone] Pager: [phone]  04/22/2014, 9:53 AM

## 2014-04-23 DIAGNOSIS — D649 Anemia, unspecified: Secondary | ICD-10-CM

## 2014-04-23 DIAGNOSIS — D72829 Elevated white blood cell count, unspecified: Secondary | ICD-10-CM

## 2014-04-23 DIAGNOSIS — E2749 Other adrenocortical insufficiency: Secondary | ICD-10-CM

## 2014-04-23 DIAGNOSIS — Z992 Dependence on renal dialysis: Secondary | ICD-10-CM

## 2014-04-23 DIAGNOSIS — F79 Unspecified intellectual disabilities: Secondary | ICD-10-CM

## 2014-04-23 LAB — GLUCOSE, CAPILLARY
GLUCOSE-CAPILLARY: 116 mg/dL — AB (ref 70–99)
GLUCOSE-CAPILLARY: 122 mg/dL — AB (ref 70–99)
GLUCOSE-CAPILLARY: 133 mg/dL — AB (ref 70–99)
GLUCOSE-CAPILLARY: 138 mg/dL — AB (ref 70–99)
Glucose-Capillary: 95 mg/dL (ref 70–99)
Glucose-Capillary: 150 mg/dL — ABNORMAL HIGH (ref 70–99)

## 2014-04-23 LAB — BASIC METABOLIC PANEL
Anion gap: 15 (ref 5–15)
BUN: 16 mg/dL (ref 6–23)
CO2: 26 meq/L (ref 19–32)
Calcium: 8.5 mg/dL (ref 8.4–10.5)
Chloride: 99 mEq/L (ref 96–112)
Creatinine, Ser: 3.75 mg/dL — ABNORMAL HIGH (ref 0.50–1.35)
GFR calc Af Amer: 18 mL/min — ABNORMAL LOW (ref >90)
GFR, EST NON AFRICAN AMERICAN: 16 mL/min — AB (ref >90)
Glucose, Bld: 135 mg/dL — ABNORMAL HIGH (ref 70–99)
Potassium: 4.9 mEq/L (ref 3.7–5.3)
Sodium: 140 mEq/L (ref 137–147)

## 2014-04-23 LAB — CBC
HEMATOCRIT: 24.6 % — AB (ref 39.0–52.0)
Hemoglobin: 7.8 g/dL — ABNORMAL LOW (ref 13.0–17.0)
MCH: 32.2 pg (ref 26.0–34.0)
MCHC: 31.7 g/dL (ref 30.0–36.0)
MCV: 101.7 fL — ABNORMAL HIGH (ref 78.0–100.0)
Platelets: 245 10*3/uL (ref 150–400)
RBC: 2.42 MIL/uL — AB (ref 4.22–5.81)
RDW: 17.8 % — ABNORMAL HIGH (ref 11.5–15.5)
WBC: 16.2 10*3/uL — ABNORMAL HIGH (ref 4.0–10.5)

## 2014-04-23 LAB — IRON AND TIBC
IRON: 66 ug/dL (ref 42–135)
Saturation Ratios: 35 % (ref 20–55)
TIBC: 188 ug/dL — ABNORMAL LOW (ref 215–435)
UIBC: 122 ug/dL — AB (ref 125–400)

## 2014-04-23 LAB — FERRITIN: Ferritin: 158 ng/mL (ref 22–322)

## 2014-04-23 MED ORDER — HYDROCORTISONE SOD SUCCINATE 100 MG IJ SOLR
25.0000 mg | Freq: Four times a day (QID) | INTRAMUSCULAR | Status: DC
  Administered 2014-04-23 – 2014-04-25 (×8): 25 mg via INTRAVENOUS
  Filled 2014-04-23 (×12): qty 0.5

## 2014-04-23 MED ORDER — DOXERCALCIFEROL 4 MCG/2ML IV SOLN
11.0000 ug | INTRAVENOUS | Status: DC
  Administered 2014-04-25 – 2014-04-27 (×2): 11 ug via INTRAVENOUS
  Filled 2014-04-23 (×2): qty 6

## 2014-04-23 NOTE — Progress Notes (Signed)
Goals of care meeting scheduled for 11AM- per patient's sister request on 04/24/14. [place] in [place]. [name] is known to me from a prior consult done in 2012 and also as an IMTS clinic patient. Hopefully we can assist this family given his terminal access issues.  Full consult to follow.  [name] MaltaElizabeth Golding, DO Palliative Medicine [phone]

## 2014-04-23 NOTE — Progress Notes (Signed)
Date: 04/23/2014  Patient name: [name]  Medical record number: [id number]  Date of birth: 03/03/1951   This patient has been seen and the plan of care was discussed with the house staff including Drs Valentino Saxon and Burtis Junes. Please see their note for complete details. I concur with their findings with the following additions/corrections: Pt is lying in bed with sitter at side. Per sitter, he sat up once but other wise sedentary. No PO intake. HRRR. + BS, soft. Opens eyes to voice and will nod head. His stress dose steroids are being titrated down. Palliative care is to talk further with family about GOC. Last dilantin level was 12/2012. Per Dr Arlean Hopping, pt needs no further inpt tx from a renal standpoint. Will finalize GOC and D/C home.   Burns Spain, MD 04/23/2014, 11:13 AM

## 2014-04-23 NOTE — Progress Notes (Addendum)
[facility] Progress Note  Assessment/Plan: 1. Shock- improved, off pressors for BP again; and would not restart,Dr. Jonnie Finner d/w primary MD on 7/3 2. Malfunctioning HD cath- qb 400 7/4 resolved 3. Anemia due to CKD / ABLA- due to fem artery injury/bleed, stable now 8.2 -to 7.4 7/4 to 7.8 , 60 Aranesp given 6/30 - will redose 7/7 - needs Fe studies checked Tuesday pre HD 4. 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 R thigh TDC placed on 6/23  5. ESRD on HD TTS k 4.9 6. Chronic hypotension- on midodrine, usual BP 90's at highest - 80s today  decreased dialysate temp to 36 but net UF on Saturday only 800 - no post weight but it appears he is about 58.5 -59 via bed scales -  7. Volume- improved seems ok; still above edw with bedscales but would go more by exam than bed weights 8. HPTH cont meds P lopw at 2 on 7/3 - will stop renvela for now - renal not sent Saturday because BMP done - need to check pre next HD- prefer to limit blood draws to pre HD via catheter; ADDENDUM: has been of his usual hectorol 1 dose since admission which explains why  Ca on the low side - will resume at discharge 9. Nutrition - Alb 3.3, renal diet; multivitamin - nepro ordered between meals; high risk for inadequate intake 10. Adrenal insufficiency - on IV steroids- titrating down 11. Palliative care- PC team has met with family about EOL issues 12. Mental disability- pt is at his baseline, which is normally mute and will communicate with hand gestures sometimes  Myriam Jacobson, PA-C [place] [phone] 04/23/2014,9:10 AM  LOS: 13 days   Pt seen, examined and agree w A/P as above.  Kelly Splinter MD pager [phone]    cell [phone] 04/23/2014, 11:36 AM    Subjective:   Per sitter, hasn't eaten breakfast - refused  Objective Filed Vitals:   04/22/14 1305 04/22/14 1758 04/22/14 2001 04/23/14 0502  BP: 90/48 98/57 107/70 75/48  Pulse: 101 108 111 97   Temp: 98 F (36.7 C) 97.4 F (36.3 C) 98.7 F (37.1 C) 98.5 F (36.9 C)  TempSrc: Oral Oral Oral Oral  Resp: _0 Height:      Weight:      SpO2: 98% 99% 91% 93%   Physical Exam General: NAD supine, opens eyes nonverbal Heart: RRR - ~ 100 Lungs: no wheezes or rales Abdomen: soft NT; central line in left groin Extremities no LE edema: Dialysis Access: right fem cath  Dialysis Orders:  [facility] 3h 27mn 56 kg 2K/2Ca 400/A1.5 P4 Heparin tight hep due to fem art injury (3500 U at center) New R thigh TDC Hectorol 11 mcg No Aranesp or Veno - when last on Epo was on 5000 - no Fe studies since May -   Additional Objective Labs: Basic Metabolic Panel:  Recent Labs Lab 04/19/14 0353 04/20/14 1722 04/21/14 0410 04/22/14 0810 04/23/14 0558  NA 138 138 140 143 140  K 3.6* 3.8 3.9 4.6 4.9  CL 97 95* 98 102 99  CO2 _1 GLUCOSE 160* 129* 126* 116* 135*  BUN 10 24* _2 CREATININE 3.10* 6.21* 3.39* 6.07* 3.75*  CALCIUM 8.0* 7.9* 8.1* 8.2* 8.5  PHOS 3.2 3.4 2.0*  --   --    Liver Function Tests:  Recent Labs Lab 04/18/14 [phone]  04/18/14 1810 04/20/14 1722  ALBUMIN 3.1* 3.3* 3.0*   CBC:  Recent Labs Lab 04/19/14 0353 04/20/14 1722 04/21/14 0410 04/22/14 0935 04/23/14 0558  WBC 8.9 9.8 9.6 8.8 16.2*  HGB 8.3* 8.2* 8.2* 7.4* 7.8*  HCT 24.5* 24.9* 25.0* 22.9* 24.6*  MCV 96.1 96.9 98.0 100.4* 101.7*  PLT 165 231 220 227 245   BCBG:  Recent Labs Lab 04/22/14 1609 04/22/14 1959 04/23/14 0008 04/23/14 0408 04/23/14 0828  GLUCAP 221* 125* 138* 133* 116*   IMedications: . sodium chloride 10 mL/hr at 04/21/14 2217  . vasopressin (PITRESSIN) infusion - *FOR SHOCK* Stopped (04/17/14 1200)   . atorvastatin  20 mg Oral QHS  . cinacalcet  30 mg Oral BID WC  . clonazePAM  0.25 mg Oral BID  . darbepoetin (ARANESP) injection - DIALYSIS  60 mcg Intravenous Q Tue-HD  . feeding supplement (NEPRO CARB STEADY)  237 mL Oral BID BM  .  hydrocortisone sodium succinate  25 mg Intravenous Q6H  . insulin aspart  0-15 Units Subcutaneous 6 times per day  . metoCLOPramide (REGLAN) injection  10 mg Intravenous 3 times per day  . midodrine  10 mg Oral TID WC  . multivitamin  1 tablet Oral QHS  . pantoprazole  40 mg Oral Q1200  . phenytoin  200 mg Oral QHS  . pneumococcal 23 valent vaccine  0.5 mL Intramuscular Tomorrow-1000  . senna  1 tablet Oral BID  . sodium chloride  10-40 mL Intracatheter Q12H

## 2014-04-23 NOTE — Progress Notes (Signed)
Subjective: [name] had no acute events overnight. He underwent HD yesterday with no complications. He was examined with the assistance of his nurse this morning who believes he has appeared comfortable. He was initially non-response (intermittent eye contact, no squeezing fingers) but did roll over on his side by himself when asked. Vital signs in last 24 hours: Filed Vitals:   04/22/14 1758 04/22/14 2001 04/23/14 0502 04/23/14 0909  BP: 98/57 107/70 75/48 95/55   Pulse: 108 111 97 96  Temp: 97.4 F (36.3 C) 98.7 F (37.1 C) 98.5 F (36.9 C) 98 F (36.7 C)  TempSrc: Oral Oral Oral Oral  Resp: 18 20 18 20   Height:      Weight:      SpO2: 99% 91% 93% 95%   Weight change: 2 lb 13.9 oz (1.3 kg)  Intake/Output Summary (Last 24 hours) at 04/23/14 1211 Last data filed at 04/23/14 1109  Gross per 24 hour  Intake    780 ml  Output    803 ml  Net    -23 ml   BP 95/55  Pulse 96  Temp(Src) 98 F (36.7 C) (Oral)  Resp 20  Ht 5' (1.524 m)  Wt 129 lb 13.6 oz (58.9 kg)  BMI 25.36 kg/m2  SpO2 95%  General Appearance:    Somnolent, non-responsive other than rolling over when instructed, appears comfortable  HEENT:    Normocephalic, without obvious abnormality, atraumatic, spontaneous eye movement with intermittent eye contact  Lungs:     Clear to auscultation bilaterally but difficult to assess as he was noncompliant, respirations unlabored  Heart:    Regular rate and rhythm, S1 and S2 normal, 2-3/6 blowing systolic ejection murmur best heard at the apex unchanged  Genitalia:    Normal male without lesion or discharge, sacral decubitus ulcer midline above anus   Extremities:   L groin with triple lumen catheter in place, scar tissue palpable, no thrill appreciated. R groin HD cath in place, no thrill appreciated.  Pulses:   2+ and symmetric all extremities   Lab Results: Basic Metabolic Panel:  Recent Labs  16/10/96 1722 04/21/14 0410 04/22/14 0810 04/23/14 0558  NA 138 140  143 140  K 3.8 3.9 4.6 4.9  CL 95* 98 102 99  CO2 28 29 28 26   GLUCOSE 129* 126* 116* 135*  BUN 24* 9 20 16   CREATININE 6.21* 3.39* 6.07* 3.75*  CALCIUM 7.9* 8.1* 8.2* 8.5  MG  --  2.2  --   --   PHOS 3.4 2.0*  --   --    Liver Function Tests:  Recent Labs  04/20/14 1722  ALBUMIN 3.0*   No results found for this basename: LIPASE, AMYLASE,  in the last 72 hours No results found for this basename: AMMONIA,  in the last 72 hours  CBC:  Recent Labs  04/22/14 0935 04/23/14 0558  WBC 8.8 16.2*  HGB 7.4* 7.8*  HCT 22.9* 24.6*  MCV 100.4* 101.7*  PLT 227 245   Cardiac Enzymes: No results found for this basename: CKTOTAL, CKMB, CKMBINDEX, TROPONINI,  in the last 72 hours BNP: No results found for this basename: PROBNP,  in the last 72 hours D-Dimer: No results found for this basename: DDIMER,  in the last 72 hours CBG:  Recent Labs  04/22/14 1609 04/22/14 1959 04/23/14 0008 04/23/14 0408 04/23/14 0828 04/23/14 1117  GLUCAP 221* 125* 138* 133* 116* 122*   Hemoglobin A1C: No results found for this basename: HGBA1C,  in  the last 72 hours Fasting Lipid Panel: No results found for this basename: CHOL, HDL, LDLCALC, TRIG, CHOLHDL, LDLDIRECT,  in the last 72 hours Thyroid Function Tests: No results found for this basename: TSH, T4TOTAL, FREET4, T3FREE, THYROIDAB,  in the last 72 hours Anemia Panel: No results found for this basename: VITAMINB12, FOLATE, FERRITIN, TIBC, IRON, RETICCTPCT,  in the last 72 hours Coagulation: No results found for this basename: LABPROT, INR,  in the last 72 hours Urine Drug Screen: Drugs of Abuse  No results found for this basename: labopia,  cocainscrnur,  labbenz,  amphetmu,  thcu,  labbarb    Alcohol Level: No results found for this basename: ETH,  in the last 72 hours Urinalysis: No results found for this basename: COLORURINE, APPERANCEUR, LABSPEC, PHURINE, GLUCOSEU, HGBUR, BILIRUBINUR, KETONESUR, PROTEINUR, UROBILINOGEN, NITRITE,  LEUKOCYTESUR,  in the last 72 hours Misc. Labs: none  Micro Results: No results found for this or any previous visit (from the past 240 hour(s)). Studies/Results: No results found. Medications: I have reviewed the patient's current medications. Scheduled Meds: . atorvastatin  20 mg Oral QHS  . cinacalcet  30 mg Oral BID WC  . clonazePAM  0.25 mg Oral BID  . darbepoetin (ARANESP) injection - DIALYSIS  60 mcg Intravenous Q Tue-HD  . [START ON 04/25/2014] doxercalciferol  11 mcg Intravenous Q T,Th,Sa-HD  . feeding supplement (NEPRO CARB STEADY)  237 mL Oral BID BM  . hydrocortisone sodium succinate  25 mg Intravenous Q6H  . insulin aspart  0-15 Units Subcutaneous 6 times per day  . metoCLOPramide (REGLAN) injection  10 mg Intravenous 3 times per day  . midodrine  10 mg Oral TID WC  . multivitamin  1 tablet Oral QHS  . pantoprazole  40 mg Oral Q1200  . phenytoin  200 mg Oral QHS  . pneumococcal 23 valent vaccine  0.5 mL Intramuscular Tomorrow-1000  . senna  1 tablet Oral BID  . sodium chloride  10-40 mL Intracatheter Q12H   Continuous Infusions: . sodium chloride 10 mL/hr at 04/21/14 2217  . vasopressin (PITRESSIN) infusion - *FOR SHOCK* Stopped (04/17/14 1200)   PRN Meds:.acetaminophen, acetaminophen, bisacodyl, diphenhydrAMINE, fentaNYL, ondansetron, phenol, sodium chloride Assessment/Plan: Active Problems:   Clotted renal dialysis arteriovenous graft   Palliative care encounter  1. Hypotension: BP has been 70s-100s/40s-50s this morning which is his usual range. Likely due to adrenal insufficiency. -Goal SBP > 75, consider pressors if hypotensive below baseline -Continue midodrine 10 mg TID -Decrease hydrocortisone to 25 mg iv q6h  2. ESRD on HD: Patient is tolerating HD via R groin catheter well. -Appreciate renal recs who have cleared patient for discharge pending goals of care discussion -Speak with palliative care regarding goals of care -TTS HD  3. Leukocytosis: WBC up  to 16.2 from 8.8 yesterday. Palliative care meeting planned today to explain goals of care that will help guide medical investigation -leukocytosis w/u pending family meeting  4. History of seizures -consider checking phenytoin level in am pending palliative care goals of care meeting -cont phenytoin and titrate if needed, cont clonazepam   5. Anemia: Hgb 7.8 up from 7.4, likely related to femoral artery bleed and chronic disease -continue to monitor -iron, ferritin, transferrin, TIBC ordered  6. Adrenal insufficiency: Likely contributing to hypotension.  -Decrease hydrocortisone to 25 mg iv q6h  7. Mental retardation: Patient appears at baseline from chart review. Non-verbal and non-engaging. He shook his head yes regardless of question.  Dispo: Disposition is deferred at this time, awaiting  improvement of current medical problems.  Anticipated discharge in approximately 4 day(s).   The patient does have a current PCP Lin[facility] Headland(Ryan K Brown, MD) and [facility] after discharge.  The patient does not know have transportation limitations that hinder transportation to clinic appointments.  .Services Needed at time of discharge: Y = Yes, Blank = No PT:   OT:   RN:   Equipment:   Other:     LOS: 13 days   Lorenda HatchetAdam L Kalani Sthilaire, MD 04/23/2014, 12:11 PM

## 2014-04-24 ENCOUNTER — Inpatient Hospital Stay (HOSPITAL_COMMUNITY): Payer: Medicare Other

## 2014-04-24 DIAGNOSIS — D649 Anemia, unspecified: Secondary | ICD-10-CM

## 2014-04-24 DIAGNOSIS — R578 Other shock: Secondary | ICD-10-CM | POA: Diagnosis not present

## 2014-04-24 DIAGNOSIS — G9340 Encephalopathy, unspecified: Secondary | ICD-10-CM | POA: Diagnosis not present

## 2014-04-24 DIAGNOSIS — I8500 Esophageal varices without bleeding: Secondary | ICD-10-CM | POA: Diagnosis not present

## 2014-04-24 DIAGNOSIS — D72829 Elevated white blood cell count, unspecified: Secondary | ICD-10-CM

## 2014-04-24 DIAGNOSIS — N186 End stage renal disease: Secondary | ICD-10-CM

## 2014-04-24 DIAGNOSIS — T82898A Other specified complication of vascular prosthetic devices, implants and grafts, initial encounter: Secondary | ICD-10-CM | POA: Diagnosis not present

## 2014-04-24 DIAGNOSIS — I959 Hypotension, unspecified: Secondary | ICD-10-CM

## 2014-04-24 LAB — GLUCOSE, CAPILLARY
GLUCOSE-CAPILLARY: 134 mg/dL — AB (ref 70–99)
GLUCOSE-CAPILLARY: 141 mg/dL — AB (ref 70–99)
GLUCOSE-CAPILLARY: 98 mg/dL (ref 70–99)
Glucose-Capillary: 87 mg/dL (ref 70–99)
Glucose-Capillary: 109 mg/dL — ABNORMAL HIGH (ref 70–99)
Glucose-Capillary: 73 mg/dL (ref 70–99)

## 2014-04-24 LAB — CBC
HCT: 21.8 % — ABNORMAL LOW (ref 39.0–52.0)
HCT: 29.7 % — ABNORMAL LOW (ref 39.0–52.0)
HEMOGLOBIN: 6.9 g/dL — AB (ref 13.0–17.0)
HEMOGLOBIN: 9.5 g/dL — AB (ref 13.0–17.0)
MCH: 32.4 pg (ref 26.0–34.0)
MCH: 28.8 pg (ref 26.0–34.0)
MCHC: 31.7 g/dL (ref 30.0–36.0)
MCHC: 32 g/dL (ref 30.0–36.0)
MCV: 102.3 fL — AB (ref 78.0–100.0)
MCV: 90 fL (ref 78.0–100.0)
Platelets: 210 10*3/uL (ref 150–400)
Platelets: 214 10*3/uL (ref 150–400)
RBC: 2.13 MIL/uL — AB (ref 4.22–5.81)
RBC: 3.3 MIL/uL — AB (ref 4.22–5.81)
RDW: 18.3 % — ABNORMAL HIGH (ref 11.5–15.5)
WBC: 14.1 10*3/uL — AB (ref 4.0–10.5)
WBC: 16.3 10*3/uL — ABNORMAL HIGH (ref 4.0–10.5)

## 2014-04-24 LAB — BASIC METABOLIC PANEL
Anion gap: 10 (ref 5–15)
BUN: 27 mg/dL — AB (ref 6–23)
CALCIUM: 8.1 mg/dL — AB (ref 8.4–10.5)
CHLORIDE: 98 meq/L (ref 96–112)
CO2: 29 meq/L (ref 19–32)
Creatinine, Ser: 5.83 mg/dL — ABNORMAL HIGH (ref 0.50–1.35)
GFR calc Af Amer: 11 mL/min — ABNORMAL LOW (ref >90)
GFR calc non Af Amer: 9 mL/min — ABNORMAL LOW (ref >90)
Glucose, Bld: 100 mg/dL — ABNORMAL HIGH (ref 70–99)
Potassium: 5.5 mEq/L — ABNORMAL HIGH (ref 3.7–5.3)
Sodium: 137 mEq/L (ref 137–147)

## 2014-04-24 LAB — TRANSFERRIN: Transferrin: 144 mg/dL — ABNORMAL LOW (ref 200–360)

## 2014-04-24 LAB — PREPARE RBC (CROSSMATCH)

## 2014-04-24 MED ORDER — DARBEPOETIN ALFA-POLYSORBATE 150 MCG/0.3ML IJ SOLN
150.0000 ug | INTRAMUSCULAR | Status: DC
  Administered 2014-04-25: 150 ug via INTRAVENOUS
  Filled 2014-04-24: qty 0.3

## 2014-04-24 NOTE — Progress Notes (Signed)
PT Cancellation Note  Patient Details Name: [name] MRN: [id number] DOB: 26-Apr-1951   Cancelled Treatment:    Reason Eval/Treat Not Completed: Medical issues which prohibited therapy Noted decr Hgb, as well as Palliative Care Team consult  Dr. Phillips OdorGolding: Please advise -- if PT is congruent with Goals of Care, we are happy to continue to work with [name]; Will follow Palliative Care's lead;  Thank you,  Van ClinesHolly Zane Samson, PT  Acute Rehabilitation Services Pager [phone] Office [phone]    Van ClinesGarrigan, Virgen Belland [facility] 04/24/2014, 11:54 AM

## 2014-04-24 NOTE — Progress Notes (Signed)
Physical Therapy Note   Spoke with Dr. Valentino Saxonothman re: how PT fits with [name]'s Goals of Care  We are happy to continue working with [name] to maximize independence and safety with mobility with the specific goal of moving well enough to go to Outpatient HD  Noted femoral HD access: we will need to confirm that it is safe to mobilize (I believe it is) with Nephrology  Will follow-up tomorrow,  Thank you,   Van ClinesHolly Mathew Storck, PT  Acute Rehabilitation Services Pager [phone] Office [phone]

## 2014-04-24 NOTE — Progress Notes (Signed)
CRITICAL VALUE ALERT  Critical value received:  hgb 6.9  Date of notification:  04/24/14  Time of notification:  0604  Critical value read back: yes  Nurse who received alert:  Sharen Heckebecca Sparks  MD notified (1st page):    Time of first page:  0611  MD notified (2nd page):  Time of second page:  Responding MD:    Time MD responded:  [phone]

## 2014-04-24 NOTE — Progress Notes (Signed)
[facility] Progress Note  Subjective:   Asleep. Transfusion in progress. Sitter present.   Objective Filed Vitals:   04/24/14 1210 04/24/14 1230 04/24/14 1424 04/24/14 1526  BP: 111/70 104/54 97/50 103/58  Pulse: 88 92 76 78  Temp: 97.9 F (36.6 [name]) 97.5 F (36.4 [name])  98.6 F (37 [name])  TempSrc: Oral Axillary  Oral  Resp: 16 16 20 18   Height:      Weight:      SpO2: 97% 100% 100% 100%   Physical Exam General: Asleep. 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 NAD Heart: RRR Lungs: Scattered rhonchi. No wheezes/rales Abdomen: Soft, NT, + BS Extremities: No LE edema. Central line left groin Dialysis Access: Rt fem TDC  Dialysis Orders: TTS @ [facility] 41mn 56 kg 2K/2Ca 400/A1.5 P4 Heparin tight hep due to fem art injury (3500 U at center) New R thigh TDC Hectorol 11 mcg No Aranesp or Veno - when last on Epo was on 5000 - no Fe studies since May -   Assessment/Plan: 1. Shock- improved, off pressors for BP again; and would not restart,Dr. SJonnie Finnerd/w primary MD on 7/3 2. Anemia due to CKD / ABLA -  In setting of fem artery injury/bleed. Hgb trending down 6.9 today. Transfusing 1 unit PRBCs at bedside. Follow CBC. Transfuse PRN. Check iron studies. Increase Aranesp 3. Terminal HD access - Failed L thigh AVG- unsuccessful attempt to salvage AVG on 6/22. R thigh TDC placed on 6/23 - last option. 4. ESRD - TTS , K+ 5.5 and now getting blood. HD first shift 5. Chronic hypotension/Vol -  SBPs 90s-100s on midodrine. Last bed wgt was 7/4, question accuracy. UF as tolerated 6. HPTH - Ca 8.1. Phos low. Holding binders for now. Continue Hectorol 7. Nutrition - Alb 3.0, renal diet; multivitamin, supplements. High risk for inadequate intake 8. Adrenal insufficiency - on IV steroids- titrating down 9. Mental disability- pt is at his baseline, which is normally mute and will communicate with hand gestures sometimes 10. EOL Issues - PC team has met with family. Patient is now a DNR. Family wishes to  continue with HD and to treat any reversible issues but wish to avoid invasive procedures/ ICU care/ prolonged suffering. Will need demonstrate ability transfer, remain alert and dialyze upright in a recliner in order to be a candidate for outpatient dialysis. PT eval pending. Appreciate PC assistance.   [name] Pager [phone]/03/2014,3:52 PM  LOS: 14 days   Renal Attending: Stable, responding to me following commands and occ smile.  For HD in AM.  Improved hemodynamics. [name]   Additional Objective Labs: Basic Metabolic Panel:  Recent Labs Lab 04/19/14 0353 04/20/14 1722 04/21/14 0410 04/22/14 0810 04/23/14 0558 04/24/14 0452  NA 138 138 140 143 140 137  K 3.6* 3.8 3.9 4.6 4.9 5.5*  CL 97 95* 98 102 99 98  CO2 29 28 29 28 26 29   GLUCOSE 160* 129* 126* 116* 135* 100*  BUN 10 24* 9 20 16  27*  CREATININE 3.10* 6.21* 3.39* 6.07* 3.75* 5.83*  CALCIUM 8.0* 7.9* 8.1* 8.2* 8.5 8.1*  PHOS 3.2 3.4 2.0*  --   --   --    Liver Function Tests:  Recent Labs Lab 04/18/14 0420 04/18/14 1810 04/20/14 1722  ALBUMIN 3.1* 3.3* 3.0*   No results found for this basename: LIPASE, AMYLASE,  in the last 168 hours CBC:  Recent Labs Lab 04/20/14 1722 04/21/14 0410 04/22/14 0935 04/23/14 0558 04/24/14  0452  WBC 9.8 9.6 8.8 16.2* 14.1*  HGB 8.2* 8.2* 7.4* 7.8* 6.9*  HCT 24.9* 25.0* 22.9* 24.6* 21.8*  MCV 96.9 98.0 100.4* 101.7* 102.3*  PLT 231 220 227 245 210   Blood Culture    Component Value Date/Time   SDES BLOOD LEFT HAND 10/02/2013 1028   SPECREQUEST BOTTLES DRAWN AEROBIC AND ANAEROBIC 10CC 10/02/2013 1028   CULT  Value: NO GROWTH 5 DAYS Performed at [facility] 10/02/2013 1028   REPTSTATUS 10/08/2013 FINAL 10/02/2013 1028    Cardiac Enzymes: No results found for this basename: CKTOTAL, CKMB, CKMBINDEX, TROPONINI,  in the last 168 hours CBG:  Recent Labs Lab 04/23/14 2000 04/24/14 0004 04/24/14 0341  04/24/14 0757 04/24/14 1137  GLUCAP 150* 134* 98 87 109*   Iron Studies:  Recent Labs  04/23/14 1250  IRON 66  TIBC 188*  TRANSFERRIN 144*  FERRITIN 158   Studies/Results: Dg Chest Port 1 View  04/24/2014   CLINICAL DATA:  Elevated white blood cell count.  EXAM: PORTABLE CHEST - 1 VIEW  COMPARISON:  Chest x-ray 03/29/2014.  FINDINGS: Mediastinum and hilar structures normal. Poor inspiration with bibasilar atelectasis and/or infiltrates. Bibasilar pneumonia suspected. Small left pleural effusion cannot be excluded. No pneumothorax. Heart size normal.  IMPRESSION: 1. Findings consistent with poor inspiration with bibasilar atelectasis and/or pneumonia, particularly on the left.  2.  Small left pleural effusion.   Electronically Signed   By: Marcello Moores  Register   On: 04/24/2014 14:09   Medications: . sodium chloride 10 mL/hr at 04/24/14 1448  . vasopressin (PITRESSIN) infusion - *FOR SHOCK* Stopped (04/17/14 1200)   . atorvastatin  20 mg Oral QHS  . cinacalcet  30 mg Oral BID WC  . clonazePAM  0.25 mg Oral BID  . darbepoetin (ARANESP) injection - DIALYSIS  60 mcg Intravenous Q Tue-HD  . [START ON 04/25/2014] doxercalciferol  11 mcg Intravenous Q T,Th,Sa-HD  . feeding supplement (NEPRO CARB STEADY)  237 mL Oral BID BM  . hydrocortisone sodium succinate  25 mg Intravenous Q6H  . insulin aspart  0-15 Units Subcutaneous 6 times per day  . metoCLOPramide (REGLAN) injection  10 mg Intravenous 3 times per day  . midodrine  10 mg Oral TID WC  . multivitamin  1 tablet Oral QHS  . pantoprazole  40 mg Oral Q1200  . phenytoin  200 mg Oral QHS  . pneumococcal 23 valent vaccine  0.5 mL Intramuscular Tomorrow-1000  . senna  1 tablet Oral BID  . sodium chloride  10-40 mL Intracatheter Q12H

## 2014-04-24 NOTE — Progress Notes (Signed)
Patient ID: [name], male   DOB: 1950-12-20, 63 y.o.   MRN: [id number] Attending physician internal medicine service transfer note: 63 year old man who does not communicate verbally. He has dialysis-dependent renal failure, chronic congestive heart failure, idiopathic seizure disorder, mental retardation, history of GI bleed secondary to gastric ulcer in 2012,. He underwent a thrombectomy and revision of a thrombosed left femoral arteriovenous graft on June 22. Of note, he had 3 previous thrombectomies of this graft:  the first in December 2014, and again in March and April 25  of this year. Procedure complicated by femoral artery injury. A temporary dialysis catheter placed in the right femoral artery on June 23. He developed hypotension secondary to acute blood loss requiring temporary pressor support. Condition was stabilized and he was transferred  to the medical service on 04/22/2014. Of note, he apparently has chronic hypotension and adrenal insufficiency and previous to this  hospitalization was on midodrine. I have not been able to locate a list of his outpatient medications at this time or to further document previous evaluation for adrenal insufficiency.  He is currently medically stable although he has now exhausted all vascular access sites. It is not clear whether or not an AV fistula can be constructed in his right femoral area. Most recent progress note from vascular surgery stated that they had nothing else to offer surgically. The palliative care team has been involved recently to evaluate goals of care with the family. Up until now, they have wanted to pursue ongoing dialysis treatments. This may need to be reexplored. A meeting was set up for this morning between Dr. Phillips OdorGolding in the family.  [name] DarbyJames Seichi Kaufhold, MD, FACP  Hematology-Oncology/Internal Medicine

## 2014-04-24 NOTE — Progress Notes (Signed)
Palliative Medicine Team at [facility]  Date: 04/24/2014   Patient Name: [name]  DOB: September 09, 1951  MRN: [id number]  Age / Sex: 63 y.o., male   PCP: Linward Headland, MD Referring Physician: Burns Spain, MD  Active Problems: 1. ESRD 2. Terminal HD Access Issues (Right Femoral Tunneled Catheter) 3. Hypotension 4. Delirium 5. Deconditioning 6. Severe Anemia 7. SVC syndrome 8. Seizure disorder 9. Prior significant GI Bleed, known ulcers   HPI/Reason for Consultation: [name] is a 63 y.o. male with mental retardation/cognitive delay, ESRD on HD and multiple chronic medical problems was admitted for HD access revision and had post-op complications with bleeding requiring ICU care. PMT consulted for goals of care due to terminal HD access issues.  At baseline and prior to hospitalization [name] was ambulatory, mute but would laugh and make noises to indicate his feelings, he would wave at people and smile. He likes music and "keeping the beat". He lived at [facility] in [place], [place] prior to admission and went to outpatient HD. He has a Manufacturing engineer caregiver (his caregiver of the past 20+ years "Ms. Malen Gauze" died back in 02-05-2023 (she was 20) and this is really when [name] started to decline.  Participants in Discussion: HCPOA: yes Sister Vassie Moment is HCPOA and another sister Cookie were present.   Advance Directive: none   Code Status Orders        Start     Ordered   04/24/14 1222  Do not attempt resuscitation (DNR)   Continuous     04/24/14 1221     I have reviewed the medical record, interviewed the patient and family, and examined the patient. The following aspects are pertinent.  Past Medical History  Diagnosis Date  . Heart murmur, systolic 08/10/2009  . Syncope 05/07/2009  . 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 Esophageal varices 11/07/2008  . Gastric ulcer 10/09    antral, with h pylori positive  . Congestive heart failure 03/06/2008    . Cellulitis and abscess of leg, except foot 03/06/2008  . Secondary hyperparathyroidism 02/02/2008  . Mute 02/02/2008  . Hyperlipidemia 02/02/2008  . Anemia 02/02/2008  . ESRD (end stage renal disease)     TTS hemodialysis  . GERD (gastroesophageal reflux disease)   . Hypertension 02/02/2008    in history  . Mental retardation 02/02/2008  . Mute   . Seizures     "non in a while at home". none in past year .  Marland Kitchen Complication of anesthesia     in the past BP has dropped    History   Social History  . Marital Status: Single    Spouse Name: N/A    Number of Children: 0  . Years of Education: N/A   Occupational History  .     Social History Main Topics  . Smoking status: Never Smoker   . Smokeless tobacco: Never Used  . Alcohol Use: No  . Drug Use: No  . Sexual Activity: No   Other Topics Concern  . None   Social History Narrative   Patient is living with care providers.    Patient is right handed.    Patient does not have any children.    Patient is on disability          History reviewed. No pertinent family history. Scheduled Meds: . atorvastatin  20 mg Oral QHS  . cinacalcet  30 mg Oral BID WC  . clonazePAM  0.25 mg Oral  BID  . darbepoetin (ARANESP) injection - DIALYSIS  60 mcg Intravenous Q Tue-HD  . [START ON 04/25/2014] doxercalciferol  11 mcg Intravenous Q T,Th,Sa-HD  . feeding supplement (NEPRO CARB STEADY)  237 mL Oral BID BM  . hydrocortisone sodium succinate  25 mg Intravenous Q6H  . insulin aspart  0-15 Units Subcutaneous 6 times per day  . metoCLOPramide (REGLAN) injection  10 mg Intravenous 3 times per day  . midodrine  10 mg Oral TID WC  . multivitamin  1 tablet Oral QHS  . pantoprazole  40 mg Oral Q1200  . phenytoin  200 mg Oral QHS  . pneumococcal 23 valent vaccine  0.5 mL Intramuscular Tomorrow-1000  . senna  1 tablet Oral BID  . sodium chloride  10-40 mL Intracatheter Q12H   Continuous Infusions: . sodium chloride 10 mL/hr at 04/24/14  0828  . vasopressin (PITRESSIN) infusion - *FOR SHOCK* Stopped (04/17/14 1200)   PRN Meds:.acetaminophen, acetaminophen, bisacodyl, diphenhydrAMINE, fentaNYL, ondansetron, phenol, sodium chloride No Known Allergies CBC:    Component Value Date/Time   WBC 14.1* 04/24/2014 0452   HGB 6.9* 04/24/2014 0452   HCT 21.8* 04/24/2014 0452   PLT 210 04/24/2014 0452   MCV 102.3* 04/24/2014 0452   NEUTROABS 2.7 10/02/2013 0305   LYMPHSABS 1.1 10/02/2013 0305   MONOABS 0.2 10/02/2013 0305   EOSABS 0.0 10/02/2013 0305   BASOSABS 0.0 10/02/2013 0305   Comprehensive Metabolic Panel:    Component Value Date/Time   NA 137 04/24/2014 0452   K 5.5* 04/24/2014 0452   CL 98 04/24/2014 0452   CO2 29 04/24/2014 0452   BUN 27* 04/24/2014 0452   CREATININE 5.83* 04/24/2014 0452   GLUCOSE 100* 04/24/2014 0452   CALCIUM 8.1* 04/24/2014 0452   AST 15 04/10/2014 1602   ALT 11 04/10/2014 1602   ALKPHOS 97 04/10/2014 1602   BILITOT 0.3 04/10/2014 1602   PROT 5.3* 04/10/2014 1602   ALBUMIN 3.0* 04/20/2014 1722    Vital Signs: BP 97/48  Pulse 79  Temp(Src) 98 F (36.7 C) (Oral)  Resp 16  Ht 5' (1.524 m)  Wt 58.9 kg (129 lb 13.6 oz)  BMI 25.36 kg/m2  SpO2 96% Filed Weights   04/21/14 2000 04/22/14 0757 04/22/14 1218  Weight: 58.5 kg (128 lb 15.5 oz) 59.8 kg (131 lb 13.4 oz) 58.9 kg (129 lb 13.6 oz)   07/05 0701 - 07/06 0700 In: 379 [P.O.:240; I.V.:139] Out: 0   Summary of Established Goals of Care and Medical Treatment Preferences  Primary Diagnoses  1. ESRD, terminal access 2. Anemia 3. MR/Cognitive Delay 4. Hypotension  Active Symptoms: 1. Lethargic 2. Deconditioned 3. Poor Appetite 4. Pain related to immobility  Psycho-social/Spiritual:  Sisters have been his primary caregivers. They are struggling with making good decisions for his care,  Prognosis: Generally Very Poor- he has terminal HD access- at best this catheter will last for a few months even with the most meticulous care. I prepared them for a  variety of possibilities including a rapid decline here in the hospital if this Hb drop is related to ACB or something more ominous-other than that I am not certain that he is dying immanently or actively.  Palliative Performance Scale: 30%  Recommendations:  1. Code Status: NOW DNR, golden rod completed and should go with patient 2. Scope of Treatment/Goals:    Family recognize that this is VERY serious, but the also thought he was doing much better on Saturday.   They do not want  him to suffer-they want pain and discomfort managed.   They are also very worried about his functional status and ability to be able to walk again and what that will include.   They desire minimally invasive interventions-no major procedures and avoid ICU care.   They are agreeable to short term transfusion with HD here in the hospital and minimally invasive work up of anemia.   Treat Reasonably Reversible Illness ex. an underlying infection (work up of elevated WBC count)   I provided education on Hospice Care and that once his catheter stops working this should be part of his EOL care-they were open to this and understood role of hospice at the point it becomes clear that Ezeriah is actively dying.   We need to determine if outpatient HD is even an option- can he sit in a chair? Need PT assessment as well.   3. Symptom Management:  PRN Fentanyl IV/[place] or TD in HD patients is recommended.  Comfort feeding as tolerated. Do not restrict diet or put him through extensive swallow evaluations. Use aspiration precautions.  Ativan for seizure and sleep PRN  4. Palliative Prophylaxis: Need regular Bowel Regimen 5. Disposition: To be determined-I doubt a group home will be able to manage his care and transport him to HD in this condition but will need to see what he can do with PT- he has been essentially bed bound since admission on 6/22.   Time In: 11:30AM Time Out: 1PM Time Total: 90 minutes Greater  than 50%  of this time was spent counseling and coordinating care related to the above assessment and plan.  Signed by: Hilbert Odor, DO  04/24/2014, 12:25 PM  Please contact Palliative Medicine Team phone at [phone] for questions and concerns.

## 2014-04-24 NOTE — Progress Notes (Signed)
Subjective: [name] had no acute events overnight. His nurse said that he seemed comfortable. This afternoon I participated in a goals of care meeting with his two sisters Vassie MomentJannie Mills [facility](HCPOW) and Cookie that was led by Dr. Anderson MaltaElizabeth Golding of palliative care. They sisters expressed that they want to treat him for anything that is not invasive or will cause him excessive discomfort but want to still be proactive in his care. They understand that ambulation to outpatient HD which requires sitting upright may be a limiting factor in his care. They think he is not far from his baseline in that this weekend he would point, kiss, and tickle, but they do think he is more lethargic and does not like being in the hospital. Code status was also discussed and they agreed to change him to DNR.   Vital signs in last 24 hours: Filed Vitals:   04/24/14 0605 04/24/14 0949 04/24/14 1210 04/24/14 1230  BP: 104/64 97/48 111/70 104/54  Pulse: 86 79 88 92  Temp: 98.1 F (36.7 C) 98 F (36.7 C) 97.9 F (36.6 C) 97.5 F (36.4 C)  TempSrc: Oral Oral Oral Axillary  Resp: 18 16 16 16   Height:      Weight:      SpO2: 94% 96% 97% 100%   Weight change:   Intake/Output Summary (Last 24 hours) at 04/24/14 1335 Last data filed at 04/24/14 0700  Gross per 24 hour  Intake    259 ml  Output      0 ml  Net    259 ml   BP 104/54  Pulse 92  Temp(Src) 97.5 F (36.4 C) (Axillary)  Resp 16  Ht 5' (1.524 m)  Wt 129 lb 13.6 oz (58.9 kg)  BMI 25.36 kg/m2  SpO2 100%  General Appearance:    Somnolent, non-responsive, appears comfortable  HEENT:    Normocephalic, without obvious abnormality, atraumatic, spontaneous eye movement with intermittent eye contact. PERRL  Lungs:     Difficult to appreciate lung sounds as patient was noncompliant, respirations unlabored  Heart:    Regular rate and rhythm, S1 and S2 normal, 2-3/6 blowing systolic ejection murmur best heard at the apex unchanged  Extremities:   L groin  with triple lumen catheter in place, scar tissue palpable. R groin HD cath in place.  Pulses:   2+ and symmetric all extremities   Lab Results: Basic Metabolic Panel:  Recent Labs  16/07/9606/05/15 0558 04/24/14 0452  NA 140 137  K 4.9 5.5*  CL 99 98  CO2 26 29  GLUCOSE 135* 100*  BUN 16 27*  CREATININE 3.75* 5.83*  CALCIUM 8.5 8.1*   Liver Function Tests: No results found for this basename: AST, ALT, ALKPHOS, BILITOT, PROT, ALBUMIN,  in the last 72 hours No results found for this basename: LIPASE, AMYLASE,  in the last 72 hours No results found for this basename: AMMONIA,  in the last 72 hours  CBC:  Recent Labs  04/23/14 0558 04/24/14 0452  WBC 16.2* 14.1*  HGB 7.8* 6.9*  HCT 24.6* 21.8*  MCV 101.7* 102.3*  PLT 245 210   Cardiac Enzymes: No results found for this basename: CKTOTAL, CKMB, CKMBINDEX, TROPONINI,  in the last 72 hours BNP: No results found for this basename: PROBNP,  in the last 72 hours D-Dimer: No results found for this basename: DDIMER,  in the last 72 hours CBG:  Recent Labs  04/23/14 1730 04/23/14 2000 04/24/14 0004 04/24/14 0341 04/24/14 0757 04/24/14 1137  GLUCAP 95 150* 134* 98 87 109*   Hemoglobin A1C: No results found for this basename: HGBA1C,  in the last 72 hours Fasting Lipid Panel: No results found for this basename: CHOL, HDL, LDLCALC, TRIG, CHOLHDL, LDLDIRECT,  in the last 72 hours Thyroid Function Tests: No results found for this basename: TSH, T4TOTAL, FREET4, T3FREE, THYROIDAB,  in the last 72 hours Anemia Panel:  Recent Labs  04/23/14 1250  FERRITIN 158  TIBC 188*  IRON 66  Transferrin pending  Coagulation: No results found for this basename: LABPROT, INR,  in the last 72 hours Urine Drug Screen: Drugs of Abuse  No results found for this basename: labopia,  cocainscrnur,  labbenz,  amphetmu,  thcu,  labbarb    Alcohol Level: No results found for this basename: ETH,  in the last 72 hours Urinalysis: No results  found for this basename: COLORURINE, APPERANCEUR, LABSPEC, PHURINE, GLUCOSEU, HGBUR, BILIRUBINUR, KETONESUR, PROTEINUR, UROBILINOGEN, NITRITE, LEUKOCYTESUR,  in the last 72 hours Misc. Labs: none  Micro Results: No results found for this or any previous visit (from the past 240 hour(s)). Studies/Results: No results found. Medications: I have reviewed the patient's current medications. Scheduled Meds: . atorvastatin  20 mg Oral QHS  . cinacalcet  30 mg Oral BID WC  . clonazePAM  0.25 mg Oral BID  . darbepoetin (ARANESP) injection - DIALYSIS  60 mcg Intravenous Q Tue-HD  . [START ON 04/25/2014] doxercalciferol  11 mcg Intravenous Q T,Th,Sa-HD  . feeding supplement (NEPRO CARB STEADY)  237 mL Oral BID BM  . hydrocortisone sodium succinate  25 mg Intravenous Q6H  . insulin aspart  0-15 Units Subcutaneous 6 times per day  . metoCLOPramide (REGLAN) injection  10 mg Intravenous 3 times per day  . midodrine  10 mg Oral TID WC  . multivitamin  1 tablet Oral QHS  . pantoprazole  40 mg Oral Q1200  . phenytoin  200 mg Oral QHS  . pneumococcal 23 valent vaccine  0.5 mL Intramuscular Tomorrow-1000  . senna  1 tablet Oral BID  . sodium chloride  10-40 mL Intracatheter Q12H   Continuous Infusions: . sodium chloride 10 mL/hr at 04/24/14 0828  . vasopressin (PITRESSIN) infusion - *FOR SHOCK* Stopped (04/17/14 1200)   PRN Meds:.acetaminophen, acetaminophen, bisacodyl, diphenhydrAMINE, fentaNYL, ondansetron, phenol, sodium chloride Assessment/Plan: Active Problems:   Clotted renal dialysis arteriovenous graft   Palliative care encounter  1. ESRD on HD: Patient is tolerating HD via R groin catheter well. His main issue is access as his only access is a triple lumen catheter in the L groin and a temporary HD catheter in the R groin. In order to receive outpatient HD, he needs to ambulate and sit upright. Family stressed desire for PT evaluation/work to see if this is feasible. They want him as  comfortable as possible and have agreed to make him DNR. They would like further medical evaluation and treatment for other conditions so long as it is noninvasive. -Appreciate renal recs  -TTS HD -PT evaluation for outpatient HD  2. Anemia: Patient hemoglobin this morning was 6.9 down from baseline of ~8. Iron level low end of normal and TIBC is low but ferritin normal which can be due to anemia of chronic disease related to his kidneys. -follow-up transferrin, fecal occult blood test -continue to monitor  3. Leukocytosis: WBC 14 down from 16 yesterday but no fever, tachycardia or tachypnea so infection unlikely. Patient does not produce urine so UTI ruled out -BCx x 2, CXR  4. Hypotension: BP has been 90s-100s/40s-70s this morning which is slightly higher for him. Likely due to adrenal insufficiency. -Goal SBP > 75, consider pressors if hypotensive below baseline -Continue midodrine 10 mg TID -Decrease hydrocortisone to 25 mg iv q6h  5. History of seizures -check phenytoin level in am -cont phenytoin and titrate if needed, cont clonazepam   6. Adrenal insufficiency: Likely contributing to hypotension.  BP has improved slightly since hydrocortisone decreased yesterday -cont hydrocortisone to 25 mg iv q6h  7. Mental retardation: Patient appears at baseline from chart review and per family. Non-verbal and non-engaging. He shook his head yes regardless of question.  Dispo: Disposition is deferred at this time, awaiting improvement of current medical problems.  Anticipated discharge in approximately 3 day(s).   The patient does have a current PCP Linward Headland(Ryan K Brown, MD) and [facility] after discharge.  The patient does not know have transportation limitations that hinder transportation to clinic appointments.  .Services Needed at time of discharge: Y = Yes, Blank = No PT: Y  OT:   RN:   Equipment:   Other:     LOS: 14 days   Lorenda HatchetAdam L Lenita Peregrina,  MD 04/24/2014, 1:35 PM

## 2014-04-25 LAB — TYPE AND SCREEN
ABO/RH(D): B POS
Antibody Screen: NEGATIVE
Unit division: 0

## 2014-04-25 LAB — CBC WITH DIFFERENTIAL/PLATELET
BASOS ABS: 0 10*3/uL (ref 0.0–0.1)
Basophils Relative: 0 % (ref 0–1)
Eosinophils Absolute: 0 10*3/uL (ref 0.0–0.7)
Eosinophils Relative: 0 % (ref 0–5)
HCT: 29.4 % — ABNORMAL LOW (ref 39.0–52.0)
Hemoglobin: 9.4 g/dL — ABNORMAL LOW (ref 13.0–17.0)
LYMPHS ABS: 1 10*3/uL (ref 0.7–4.0)
Lymphocytes Relative: 6 % — ABNORMAL LOW (ref 12–46)
MCH: 28.7 pg (ref 26.0–34.0)
MCHC: 32 g/dL (ref 30.0–36.0)
MCV: 89.9 fL (ref 78.0–100.0)
MONO ABS: 0.5 10*3/uL (ref 0.1–1.0)
MONOS PCT: 3 % (ref 3–12)
Neutro Abs: 15.3 10*3/uL — ABNORMAL HIGH (ref 1.7–7.7)
Neutrophils Relative %: 91 % — ABNORMAL HIGH (ref 43–77)
PLATELETS: 222 10*3/uL (ref 150–400)
RBC: 3.27 MIL/uL — ABNORMAL LOW (ref 4.22–5.81)
WBC: 16.8 10*3/uL — AB (ref 4.0–10.5)

## 2014-04-25 LAB — RENAL FUNCTION PANEL
Albumin: 3.3 g/dL — ABNORMAL LOW (ref 3.5–5.2)
Anion gap: 15 (ref 5–15)
BUN: 40 mg/dL — ABNORMAL HIGH (ref 6–23)
CALCIUM: 8.3 mg/dL — AB (ref 8.4–10.5)
CO2: 26 meq/L (ref 19–32)
CREATININE: 7.99 mg/dL — AB (ref 0.50–1.35)
Chloride: 98 mEq/L (ref 96–112)
GFR calc Af Amer: 7 mL/min — ABNORMAL LOW (ref >90)
GFR calc non Af Amer: 6 mL/min — ABNORMAL LOW (ref >90)
GLUCOSE: 97 mg/dL (ref 70–99)
Phosphorus: 3.1 mg/dL (ref 2.3–4.6)
Potassium: 5.8 mEq/L — ABNORMAL HIGH (ref 3.7–5.3)
Sodium: 139 mEq/L (ref 137–147)

## 2014-04-25 LAB — GLUCOSE, CAPILLARY
GLUCOSE-CAPILLARY: 107 mg/dL — AB (ref 70–99)
GLUCOSE-CAPILLARY: 113 mg/dL — AB (ref 70–99)
Glucose-Capillary: 70 mg/dL (ref 70–99)
Glucose-Capillary: 134 mg/dL — ABNORMAL HIGH (ref 70–99)
Glucose-Capillary: 112 mg/dL — ABNORMAL HIGH (ref 70–99)

## 2014-04-25 LAB — IRON AND TIBC
Iron: 16 ug/dL — ABNORMAL LOW (ref 42–135)
Saturation Ratios: 8 % — ABNORMAL LOW (ref 20–55)
TIBC: 189 ug/dL — ABNORMAL LOW (ref 215–435)
UIBC: 173 ug/dL (ref 125–400)

## 2014-04-25 LAB — PHENYTOIN LEVEL, TOTAL: Phenytoin Lvl: 5 ug/mL — ABNORMAL LOW (ref 10.0–20.0)

## 2014-04-25 LAB — FERRITIN: Ferritin: 176 ng/mL (ref 22–322)

## 2014-04-25 MED ORDER — DARBEPOETIN ALFA-POLYSORBATE 150 MCG/0.3ML IJ SOLN
INTRAMUSCULAR | Status: AC
  Administered 2014-04-25: 150 ug via INTRAVENOUS
  Filled 2014-04-25: qty 0.3

## 2014-04-25 MED ORDER — HYDROCORTISONE SOD SUCCINATE 100 MG IJ SOLR
25.0000 mg | Freq: Two times a day (BID) | INTRAMUSCULAR | Status: DC
  Administered 2014-04-25: 25 mg via INTRAVENOUS
  Administered 2014-04-26: 0.5 mg via INTRAVENOUS
  Filled 2014-04-25 (×4): qty 0.5

## 2014-04-25 MED ORDER — DOXERCALCIFEROL 4 MCG/2ML IV SOLN
INTRAVENOUS | Status: AC
  Administered 2014-04-25: 11 ug via INTRAVENOUS
  Filled 2014-04-25: qty 6

## 2014-04-25 MED ORDER — MIDODRINE HCL 5 MG PO TABS
ORAL_TABLET | ORAL | Status: AC
  Administered 2014-04-25: 10 mg via ORAL
  Filled 2014-04-25: qty 2

## 2014-04-25 NOTE — Progress Notes (Signed)
PT Cancellation Note  Patient Details Name: Rowe Clackeal W Benscoter MRN: [id number] DOB: 09/30/1951   Cancelled Treatment:    Reason Eval/Treat Not Completed: Patient at procedure or test/unavailable Currently in HD; Will reattempt PT later today; Confirmed with Renal that it is OK to mobilize with Tunneled Femoral HD catheter;   Thank you,  Van ClinesHolly Travaris Kosh, PT  Acute Rehabilitation Services Pager [phone] Office [phone]    Van ClinesGarrigan, Itzayana Pardy [facility] 04/25/2014, 9:25 AM

## 2014-04-25 NOTE — Progress Notes (Signed)
Patient ID: [name], male [name]  DOB: 1950/11/29, 63 y.o.   MRN: [id number] Attending physician note: I examined this patient together with resident physician Dr. Farley LyAdam Rothman and I concur with his evaluation and management plan. The patient is much more interactive today. Blood pressure is now stable. He will begin to work with physical therapy. He is to be able to ambulate and sit upright to receive outpatient dialysis treatment.

## 2014-04-25 NOTE — Procedures (Signed)
Tolerating hemodialysis via right femoral PC.  He has a low BP but clinically improved and pretty much remarkably at baseline. Just a little flat but not uncommon for him.  Follows and points and is interactive. Stable dialysis. K 5.8 and WBC 16,800.  Results for Rowe ClackHARRIS, Anddy W (MRN [id number]) as of 04/25/2014 09:43  Ref. Range 04/25/2014 05:15  Sodium Latest Range: 137-147 mEq/L 139  Potassium Latest Range: 3.7-5.3 mEq/L 5.8 (H)  Chloride Latest Range: 96-112 mEq/L 98  CO2 Latest Range: 19-32 mEq/L 26  BUN Latest Range: 6-23 mg/dL 40 (H)  Creatinine Latest Range: 0.50-1.35 mg/dL 9.147.99 (H)  Calcium Latest Range: 8.4-10.5 mg/dL 8.3 (L)  GFR calc non Af Amer Latest Range: >90 mL/min 6 (L)  GFR calc Af Amer Latest Range: >90 mL/min 7 (L)  Glucose Latest Range: 70-99 mg/dL 97  Anion gap Latest Range: 5-15  15  Phosphorus Latest Range: 2.3-4.6 mg/dL 3.1  Albumin Latest Range: 3.5-5.2 g/dL 3.3 (L)  WBC Latest Range: 4.0-10.5 K/uL 16.8 (H)  RBC Latest Range: 4.22-5.81 MIL/uL 3.27 (L)  Hemoglobin Latest Range: 13.0-17.0 g/dL 9.4 (L)  HCT Latest Range: 39.0-52.0 % 29.4 (L)  MCV Latest Range: 78.0-100.0 fL 89.9  MCH Latest Range: 26.0-34.0 pg 28.7  MCHC Latest Range: 30.0-36.0 g/dL 78.232.0  RDW Latest Range: 11.5-15.5 % NOT CALCULATED  Platelets Latest Range: 150-400 K/uL 222   [name]   LOS: 15 days   Locklan Canoy [name] 04/25/2014,9:42 AM

## 2014-04-25 NOTE — Progress Notes (Signed)
Subjective: [name] had no acute events overnight. He was examined during HD today. He received 1 U PRBC yesterday. He was much more interactive than he has been since transfer from the ICU. When the team came around his bed, he clearly tried to engage with [place] and was pointing at the IVs and dialysis machine. He seemed to be in a pleasant mood.   Vital signs in last 24 hours: Filed Vitals:   04/25/14 1000 04/25/14 1030 04/25/14 1035 04/25/14 1100  BP: 120/55 75/58 93/56  121/61  Pulse: 95 107 109 106  Temp:    96.8 F (36 C)  TempSrc:    Oral  Resp:    18  Height:      Weight:      SpO2:       Weight change:   Intake/Output Summary (Last 24 hours) at 04/25/14 1418 Last data filed at 04/25/14 1100  Gross per 24 hour  Intake    325 ml  Output   2603 ml  Net  -2278 ml   BP 121/61  Pulse 106  Temp(Src) 96.8 F (36 C) (Oral)  Resp 18  Ht 5' (1.524 m)  Wt 139 lb 1.8 oz (63.1 kg)  BMI 27.17 kg/m2  SpO2 92%  General Appearance:    Relatively energetic, responsive, appears comfortable  HEENT:    Normocephalic, without obvious abnormality, atraumatic, maintained eye contact. PERRL  Lungs:     Difficult to appreciate lung sounds as patient was noncompliant, respirations unlabored   Heart:    Regular rate and rhythm, S1 and S2 normal, 2-3/6 blowing systolic ejection murmur best heard at the apex unchanged  Extremities:   L groin with triple lumen catheter in place, scar tissue palpable. R groin HD cath in place.  Pulses:   2+ and symmetric all extremities   Lab Results: Basic Metabolic Panel:  Recent Labs  16/10/96 0452 04/25/14 0515  NA 137 139  K 5.5* 5.8*  CL 98 98  CO2 29 26  GLUCOSE 100* 97  BUN 27* 40*  CREATININE 5.83* 7.99*  CALCIUM 8.1* 8.3*  PHOS  --  3.1   Liver Function Tests:  Recent Labs  04/25/14 0515  ALBUMIN 3.3*   No results found for this basename: LIPASE, AMYLASE,  in the last 72 hours No results found for this basename: AMMONIA,  in  the last 72 hours  CBC:  Recent Labs  04/24/14 1830 04/25/14 0515  WBC 16.3* 16.8*  NEUTROABS  --  15.3*  HGB 9.5* 9.4*  HCT 29.7* 29.4*  MCV 90.0 89.9  PLT 214 222   Cardiac Enzymes: No results found for this basename: CKTOTAL, CKMB, CKMBINDEX, TROPONINI,  in the last 72 hours BNP: No results found for this basename: PROBNP,  in the last 72 hours D-Dimer: No results found for this basename: DDIMER,  in the last 72 hours CBG:  Recent Labs  04/24/14 1137 04/24/14 1553 04/24/14 2142 04/25/14 0018 04/25/14 0517 04/25/14 1227  GLUCAP 109* 73 141* 107* 113* 70   Hemoglobin A1C: No results found for this basename: HGBA1C,  in the last 72 hours Fasting Lipid Panel: No results found for this basename: CHOL, HDL, LDLCALC, TRIG, CHOLHDL, LDLDIRECT,  in the last 72 hours Thyroid Function Tests: No results found for this basename: TSH, T4TOTAL, FREET4, T3FREE, THYROIDAB,  in the last 72 hours Anemia Panel:  Recent Labs  04/25/14 0515  FERRITIN 176  TIBC 189*  IRON 16*   Coagulation: No results  found for this basename: LABPROT, INR,  in the last 72 hours Urine Drug Screen: Drugs of Abuse  No results found for this basename: labopia,  cocainscrnur,  labbenz,  amphetmu,  thcu,  labbarb    Alcohol Level: No results found for this basename: ETH,  in the last 72 hours Urinalysis: No results found for this basename: COLORURINE, APPERANCEUR, LABSPEC, PHURINE, GLUCOSEU, HGBUR, BILIRUBINUR, KETONESUR, PROTEINUR, UROBILINOGEN, NITRITE, LEUKOCYTESUR,  in the last 72 hours Misc. Labs: Phenytoin 5  Micro Results: No results found for this or any previous visit (from the past 240 hour(s)). Studies/Results: Dg Chest Port 1 View  04/24/2014   CLINICAL DATA:  Elevated white blood cell count.  EXAM: PORTABLE CHEST - 1 VIEW  COMPARISON:  Chest x-ray 03/29/2014.  FINDINGS: Mediastinum and hilar structures normal. Poor inspiration with bibasilar atelectasis and/or infiltrates. Bibasilar  pneumonia suspected. Small left pleural effusion cannot be excluded. No pneumothorax. Heart size normal.  IMPRESSION: 1. Findings consistent with poor inspiration with bibasilar atelectasis and/or pneumonia, particularly on the left.  2.  Small left pleural effusion.   Electronically Signed   By: Maisie Fushomas  Register   On: 04/24/2014 14:09   Medications: I have reviewed the patient's current medications. Scheduled Meds: . atorvastatin  20 mg Oral QHS  . cinacalcet  30 mg Oral BID WC  . clonazePAM  0.25 mg Oral BID  . darbepoetin (ARANESP) injection - DIALYSIS  150 mcg Intravenous Q Tue-HD  . doxercalciferol  11 mcg Intravenous Q T,Th,Sa-HD  . feeding supplement (NEPRO CARB STEADY)  237 mL Oral BID BM  . hydrocortisone sodium succinate  25 mg Intravenous BID  . insulin aspart  0-15 Units Subcutaneous 6 times per day  . metoCLOPramide (REGLAN) injection  10 mg Intravenous 3 times per day  . midodrine  10 mg Oral TID WC  . multivitamin  1 tablet Oral QHS  . pantoprazole  40 mg Oral Q1200  . phenytoin  200 mg Oral QHS  . pneumococcal 23 valent vaccine  0.5 mL Intramuscular Tomorrow-1000  . senna  1 tablet Oral BID  . sodium chloride  10-40 mL Intracatheter Q12H   Continuous Infusions: . sodium chloride 10 mL/hr at 04/24/14 0828  . vasopressin (PITRESSIN) infusion - *FOR SHOCK* Stopped (04/17/14 1200)   PRN Meds:.acetaminophen, acetaminophen, bisacodyl, diphenhydrAMINE, fentaNYL, ondansetron, phenol, sodium chloride Assessment/Plan: Active Problems:   Clotted renal dialysis arteriovenous graft   Palliative care encounter  1. ESRD on HD: Patient is tolerating HD via R groin catheter well and had a session today. His main issue is access as his only access is a triple lumen catheter in the L groin and a temporary HD catheter in the R groin. In order to receive outpatient HD, he needs to ambulate and sit upright. Patient needs to be evaluated and work with PT/OT to ensure he can sit upright to  tolerate outpatient HD. PT/OT came by today but he was at HD and they left notes saying they would return later today for evals. -Appreciate renal recs  -TTS HD -PT/OT evaluation for outpatient HD  2. Hypotension: BP has been 100s-120s/50s-70s this morning which is high for him but right before HD. Likely due to acquired adrenal insufficiency v acute shock. -Goal SBP > 75, consider pressors if hypotensive below baseline -Continue midodrine 10 mg TID -Decrease hydrocortisone to 25 mg iv BID  3. Leukocytosis: WBC at 16 up from 14  yesterday but no fever, tachycardia or tachypnea so infection seems unlikely. Patient does  not produce urine so UTI ruled out. CXR appears most consistent with atelectasis. Will continue to have low index of suspicion for infection but currently holding off on antibiotics. He is on steroids which we are weaning. -cont to monitor and put on vancomycin/zosyn if he spikes a fever or other sign of infection -follow-up BCx x 2  4. Anemia: Patient hemoglobin has increased to 9.4 this morning after 1 U PRBC. Iron level low end of normal and TIBC is low but ferritin normal which can be due to anemia of chronic disease related to his kidneys. -follow-up fecal occult blood test -continue to monitor  5. History of seizures: Phenytoin level is low (5). Spoke to pharmacy who said they would titrate it up. -follow-up with pharmacy on phenytoin dosage -cont clonazepam   6. Adrenal insufficiency: Likely contributing to hypotension.  BP has improved slightly since hydrocortisone decreased yesterday -cont hydrocortisone to 25 mg iv q6h  7. Mental retardation: Patient appears at baseline from chart review and per family. Non-verbal but engaging today. He maintained eye contact and pointed at the IVs and dialysis machines.  Dispo: Disposition is deferred at this time, awaiting improvement of current medical problems.  Anticipated discharge in approximately 3 day(s).   The patient  does have a current PCP Courtney Paris(Eden W Jones, MD) and [facility].  The patient does not know have transportation limitations that hinder transportation to clinic appointments.  .Services Needed at time of discharge: Y = Yes, Blank = No PT: Y  OT:   RN:   Equipment:   Other:     LOS: 15 days   Lorenda HatchetAdam L Sohana Austell, MD 04/25/2014, 2:18 PM

## 2014-04-25 NOTE — Progress Notes (Signed)
Physical Therapy Treatment Patient Details Name: [name] MRN: [id number] DOB: 1951/03/16 Today's Date: 04/25/2014    History of Present Illness 63 yo baseline non verbal with ESRD on HD admitted for revision of thrombosed R AV graft. The procedure was complicated by injury to left superficial femoral artery requiring patch and significant blood loss. Pt with multiple problems with HD access.    PT Comments    PT sessions to focus on functional mobility with the goal of being able to move well enough to go to Outpt HD; Unfortunately, [name] did not agree to getting up this afternoon, and upon reviewing charts from previous admissions, it seems he has a history of not participating with PT here acutely (understandably, with strange place, unfamiliar routine, strange people);  I believe we'll have greater success with mobility with [name] familiar caregivers; will try to coordinate PT session when his caregivers are present to maximize chances of him participating;  Continue to recommend dc home to his familiar environment, routine, and caregivers, with HHPT to follow-up there;   Discussed case with Dr. Valentino Saxon   Follow Up Recommendations  Home health PT;Supervision/Assistance - 24 hour     Equipment Recommendations  Rolling walker with 5" wheels;3in1 (PT);Wheelchair (measurements PT);Wheelchair cushion (measurements PT) (Consider a reclining wheelchair for transport to/from HD)    Recommendations for Other Services       Precautions / Restrictions Precautions Precautions: Fall    Mobility  Bed Mobility Overal bed mobility: Needs Assistance             General bed mobility comments: Pt pulled away from attempts to help him to sit up on EOB; Able to pull self from supine to long sit to retrieve covers without assist  Transfers                 General transfer comment: Not agreeable to getting up and OOB  Ambulation/Gait                  Stairs            Wheelchair Mobility    Modified Rankin (Stroke Patients Only)       Balance                                    Cognition Arousal/Alertness:  (Sleeping, but easily aroused) Behavior During Therapy: Restless Overall Cognitive Status: No family/caregiver present to determine baseline cognitive functioning (Per Nephrology note, he is close to baseline)                      Exercises      General Comments        Pertinent Vitals/Pain no apparent distress     Home Living                      Prior Function            PT Goals (current goals can now be found in the care plan section) Acute Rehab PT Goals Patient Stated Goal: Pt mute PT Goal Formulation: Patient unable to participate in goal setting Time For Goal Achievement: 05/04/14 Potential to Achieve Goals: Good Progress towards PT goals: Not progressing toward goals - comment (Participation with PT is dubious at best)    Frequency  Min 3X/week    PT Plan Current plan remains appropriate  Co-evaluation             End of Session   Activity Tolerance: Other (comment);Treatment limited secondary to agitation (Clearly not wanting to get up) Patient left: in bed;with call bell/phone within reach;with nursing/sitter in room     Time: 1430-1444 PT Time Calculation (min): 14 min  Charges:  $Therapeutic Activity: 8-22 mins                    G Codes:      [name] 04/25/2014, 4:28 PM  [name],   Acute Rehabilitation Services Pager [phone] Office [phone]

## 2014-04-25 NOTE — Progress Notes (Signed)
NUTRITION FOLLOW UP  DOCUMENTATION CODES Per approved criteria  -Not Applicable   INTERVENTION: Continue Nepro Shake po BID, each supplement provides 425 kcal and 19 grams protein Continue full assistance with meals RD to follow for nutrition care plan  NUTRITION DIAGNOSIS: Inadequate oral intake related to lethargy, poor appetite as evidenced by PO intake 0-25%, improved.  Goal: Pt to meet >/= 90% of their estimated nutrition needs, improved  Monitor:  PO & supplemental intake, goals of care, weight, labs, I/O's  ASSESSMENT: [name] baseline non-verbal with ESRD on HD admitted for revision of thrombosed R AV graft. The procedure was complicated by injury to left superficial femoral artery requiring patch and significant blood loss. Brought to PACU hypotensive, requiring vasopressors.  Patient s/p procedure 6/23: #1  Placement of left femoral vein ultrasound-guided triple-lumen catheter  #2  Removal of hemodialysis catheter  #3  Placement of right femoral tunneled dialysis catheter  Per chart, pt is medically stable, however he has exhausted all of his vascular access sites.  Per GOC note completed 7/6, plan for comfort feeding as tolerated, "do not restrict diet or put him through extensive swallow evaluations."  Currently on a Dys 3, thin liquid diet. Ordered for Nepro Shake PO BID between meals. Discussed intake with RN, who states that pt ate 100% of lunch when fed today.  Potassium elevated at 5.8 Phosphorus WNL  EDW per renal is 56 kg  Height: Ht Readings from Last 1 Encounters:  04/10/14 5' (1.524 m)    Weight: Wt Readings from Last 1 Encounters:  04/25/14 139 lb 1.8 oz (63.1 kg)    BMI:  Body mass index is 27.17 kg/(m^2). Overweight  Estimated Nutritional Needs: Kcal: 1700-1900 Protein: 80-90 gm Fluid: 1.2 L  Skin:  healed wound to coccyx Incisions to thigh and groin  Diet Order: Dysphagia 3, thin liquids   Intake/Output Summary (Last 24  hours) at 04/25/14 1418 Last data filed at 04/25/14 1100  Gross per 24 hour  Intake    325 ml  Output   2603 ml  Net  -2278 ml    Last BM: 7/5  Labs:   Recent Labs Lab 04/19/14 0353 04/20/14 1722 04/21/14 0410  04/23/14 0558 04/24/14 0452 04/25/14 0515  NA 138 138 140  < > 140 137 139  K 3.6* 3.8 3.9  < > 4.9 5.5* 5.8*  CL 97 95* 98  < > 99 98 98  CO2 29 28 29   < > 26 29 26   BUN 10 24* 9  < > 16 27* 40*  CREATININE 3.10* 6.21* 3.39*  < > 3.75* 5.83* 7.99*  CALCIUM 8.0* 7.9* 8.1*  < > 8.5 8.1* 8.3*  MG 2.2  --  2.2  --   --   --   --   PHOS 3.2 3.4 2.0*  --   --   --  3.1  GLUCOSE 160* 129* 126*  < > 135* 100* 97  < > = values in this interval not displayed.  CBG (last 3)   Recent Labs  04/25/14 0018 04/25/14 0517 04/25/14 1227  GLUCAP 107* 113* 70    Scheduled Meds: . atorvastatin  20 mg Oral QHS  . cinacalcet  30 mg Oral BID WC  . clonazePAM  0.25 mg Oral BID  . darbepoetin (ARANESP) injection - DIALYSIS  150 mcg Intravenous Q Tue-HD  . doxercalciferol  11 mcg Intravenous Q T,Th,Sa-HD  . feeding supplement (NEPRO CARB STEADY)  237 mL Oral BID  BM  . hydrocortisone sodium succinate  25 mg Intravenous BID  . insulin aspart  0-15 Units Subcutaneous 6 times per day  . metoCLOPramide (REGLAN) injection  10 mg Intravenous 3 times per day  . midodrine  10 mg Oral TID WC  . multivitamin  1 tablet Oral QHS  . pantoprazole  40 mg Oral Q1200  . phenytoin  200 mg Oral QHS  . pneumococcal 23 valent vaccine  0.5 mL Intramuscular Tomorrow-1000  . senna  1 tablet Oral BID  . sodium chloride  10-40 mL Intracatheter Q12H    Continuous Infusions: . sodium chloride 10 mL/hr at 04/24/14 0828  . vasopressin (PITRESSIN) infusion - *FOR SHOCK* Stopped (04/17/14 1200)    Past Medical History  Diagnosis Date  . Heart murmur, systolic 08/10/2009  . Syncope 05/07/2009  . 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 Esophageal varices 11/07/2008  . Gastric ulcer 10/09     antral, with h pylori positive  . Congestive heart failure 03/06/2008  . Cellulitis and abscess of leg, except foot 03/06/2008  . Secondary hyperparathyroidism 02/02/2008  . Mute 02/02/2008  . Hyperlipidemia 02/02/2008  . Anemia 02/02/2008  . ESRD (end stage renal disease)     TTS hemodialysis  . GERD (gastroesophageal reflux disease)   . Hypertension 02/02/2008    in history  . Mental retardation 02/02/2008  . Mute   . Seizures     "non in a while at home". none in past year .  Marland Kitchen. Complication of anesthesia     in the past BP has dropped     Past Surgical History  Procedure Laterality Date  . Left forearm graft      for HD  . Arteriovenous graft placement  11/22/10    Right thigh AVG  . Thrombectomy and revision of arterioventous (av) goretex  graft    . Thrombectomy and revision of arterioventous (av) goretex  graft  10/22/2012    Procedure: THROMBECTOMY AND REVISION OF ARTERIOVENTOUS (AV) GORETEX  GRAFT;  Surgeon: Larina Earthlyodd F Early, MD;  [facility];  Service: Vascular;  Laterality: Right;  . Thrombectomy w/ embolectomy  11/10/2012    Procedure: THROMBECTOMY ARTERIOVENOUS GORE-TEX GRAFT;  Surgeon: Pryor OchoaJames D Lawson, MD;  [facility];  Service: Vascular;  Laterality: Right;  . Thrombectomy and revision of arterioventous (av) goretex  graft Right 12/08/2012    Procedure: THROMBECTOMY AND REVISION OF ARTERIOVENTOUS (AV) GORETEX  GRAFT right thigh;  Surgeon: Sherren Kernsharles E Fields, MD;  [facility];  Service: Vascular;  Laterality: Right;  Susie Cassette. Venogram N/A 12/08/2012    Procedure: VENOGRAM;  Surgeon: Sherren Kernsharles E Fields, MD;  [facility];  Service: Vascular;  Laterality: N/A;  Intraoperative Central venogram  . Thrombectomy w/ embolectomy Right 12/12/2012    Procedure: THROMBECTOMY ARTERIOVENOUS GORE-TEX GRAFT;  Surgeon: Nada LibmanVance W Brabham, MD;  [facility];  Service: Vascular;  Laterality: Right;  . Insertion of dialysis catheter Left 12/14/2012    Procedure: INSERTION OF DIALYSIS  CATHETER;  Surgeon: Nada LibmanVance W Brabham, MD;  [facility];  Service: Vascular;  Laterality: Left;  . Insertion of dialysis catheter Right 01/13/2013    Procedure: INSERTION OF DIALYSIS CATHETER;  Surgeon: Nada LibmanVance W Brabham, MD;  [facility];  Service: Vascular;  Laterality: Right;  . Removal of a dialysis catheter Left 01/13/2013    Procedure: REMOVAL OF A DIALYSIS CATHETER;  Surgeon: Nada LibmanVance W Brabham, MD;  [facility]: [facility] OR;  Service: Vascular;  Laterality: Left;  . Av fistula  placement Left 02/11/2013    Procedure: INSERTION OF ARTERIOVENOUS (AV) GORE-TEX GRAFT THIGH;  Surgeon: Nada LibmanVance W Brabham, MD;  Location: [facility] OR;  Service: Vascular;  Laterality: Left;  using 6mm x 50cm Gore-Tex Vascular Graft  . Esophagogastroduodenoscopy N/A 02/16/2013    Procedure: ESOPHAGOGASTRODUODENOSCOPY (EGD);  Surgeon: Vertell NovakJames L Edwards Jr., MD;  [facility];  Service: Endoscopy;  Laterality: N/A;  bedside  . Esophagogastroduodenoscopy N/A 09/14/2013    Procedure: ESOPHAGOGASTRODUODENOSCOPY (EGD);  Surgeon: Vertell NovakJames L Edwards Jr., MD;  [facility];  Service: Endoscopy;  Laterality: N/A;  control of bleeding if needed  . Esophagogastroduodenoscopy N/A 10/03/2013    Procedure: ESOPHAGOGASTRODUODENOSCOPY (EGD);  Surgeon: Theda BelfastPatrick D Hung, MD;  [facility];  Service: Endoscopy;  Laterality: N/A;  Bedside  . Radiology with anesthesia Right 10/19/2013    Procedure: RADIOLOGY WITH ANESTHESIA;  Surgeon: Malachy MoanHeath McCullough, MD;  [facility];  Service: Radiology;  Laterality: Right;  . Radiology with anesthesia Left 12/28/2013    Procedure: RADIOLOGY WITH ANESTHESIA;  Surgeon: Reola CalkinsGlenn T Yamagata, MD;  [facility];  Service: Radiology;  Laterality: Left;  . Thrombectomy and revision of arterioventous (av) goretex  graft Left 01/25/2014    Procedure: THROMBECTOMY AND REVISION OF LEFT THIGH ARTERIOVENTOUS (AV) GORETEX  GRAFT WITH PATCH ANGIOPLASTY;  Surgeon: Pryor OchoaJames D Lawson, MD;  [facility];  Service:  Vascular;  Laterality: Left;  . Thrombectomy and revision of arterioventous (av) goretex  graft Left 04/10/2014    Procedure: THROMBECTOMY AND REVISION OF ARTERIOVENTOUS (AV) GORETEX  GRAFT;  Surgeon: Sherren Kernsharles E Fields, MD;  [facility];  Service: Vascular;  Laterality: Left;  . Patch angioplasty Left 04/10/2014    Procedure: PATCH ANGIOPLASTY LEFT SFA;  Surgeon: Sherren Kernsharles E Fields, MD;  [facility];  Service: Vascular;  Laterality: Left;  . Insertion of dialysis catheter Bilateral 04/11/2014    Procedure: INSERTION OF DIALYSIS CATHETER RIGHT FEMORAL VEIN; INSERTION OF TRIPLE LUMEN LEFT FEMORAL VEIN CENTRAL LINE; REMOVAL OF DIALYSIS CATHETER IN RIGHT FEMORAL VEIN.;  Surgeon: Nada LibmanVance W Brabham, MD;  Location: [facility] OR;  Service: Vascular;  Laterality: Bilateral;    Maureen ChattersKatie Lamberton, RD, LDN Pager #: [phone] After-Hours Pager #: [phone]

## 2014-04-25 NOTE — Progress Notes (Signed)
OT Cancellation Note  Patient Details Name: [name] MRN: [id number] DOB: 1950/12/02   Cancelled Treatment:    Reason Eval/Treat Not Completed: Patient declined, no reason specified. Will re attempt tomorrow  Galen ManilaSpencer, Annabella Elford Jeanette 04/25/2014, 2:47 PM

## 2014-04-25 NOTE — Progress Notes (Signed)
OT Cancellation Note  Patient Details Name: [name] MRN: [id number] DOB: 1951-01-05   Cancelled Treatment:    Reason Eval/Treat Not Completed: Patient at procedure or test/ unavailable. Pt at hemodialysis, will re attempt later today as time allows/as appropriate  Galen ManilaSpencer, [name] Jeanette 04/25/2014, 10:40 AM

## 2014-04-25 NOTE — Progress Notes (Signed)
MEDICATION RELATED CONSULT NOTE - INITIAL   Pharmacy Consult for Phenytoin Indication: PTA medication for history of seizures  No Known Allergies  Patient Measurements: Height: 5' (152.4 cm) Weight: 139 lb 1.8 oz (63.1 kg) IBW/kg (Calculated) : 50  Vital Signs: Temp: 96.8 F (36 C) (07/07 1100) Temp src: Oral (07/07 1100) BP: 121/61 mmHg (07/07 1100) Pulse Rate: 106 (07/07 1100) Intake/Output from previous day: 07/06 0701 - 07/07 0700 In: 325 [Blood:325] Out: 3 [Stool:3] Intake/Output from this shift: Total I/O In: -  Out: 2600 [Other:2600]  Labs:  Recent Labs  04/23/14 0558 04/24/14 0452 04/24/14 1830 04/25/14 0515  WBC 16.2* 14.1* 16.3* 16.8*  HGB 7.8* 6.9* 9.5* 9.4*  HCT 24.6* 21.8* 29.7* 29.4*  PLT 245 210 214 222  CREATININE 3.75* 5.83*  --  7.99*  PHOS  --   --   --  3.1  ALBUMIN  --   --   --  3.3*   Estimated Creatinine Clearance: 7.2 ml/min (by C-G formula based on Cr of 7.99).   Microbiology: Recent Results (from the past 720 hour(s))  MRSA PCR SCREENING     Status: None   Collection Time    04/11/14 12:56 AM      Result Value Ref Range Status   MRSA by PCR NEGATIVE  NEGATIVE Final   Comment:            The GeneXpert MRSA Assay (FDA     approved for NASAL specimens     only), is one component of a     comprehensive MRSA colonization     surveillance program. It is not     intended to diagnose MRSA     infection nor to guide or     monitor treatment for     MRSA infections.    Medical History: Past Medical History  Diagnosis Date  . Heart murmur, systolic 08/10/2009  . Syncope 05/07/2009  . 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 Esophageal varices 11/07/2008  . Gastric ulcer 10/09    antral, with h pylori positive  . Congestive heart failure 03/06/2008  . Cellulitis and abscess of leg, except foot 03/06/2008  . Secondary hyperparathyroidism 02/02/2008  . Mute 02/02/2008  . Hyperlipidemia 02/02/2008  . Anemia 02/02/2008  .  ESRD (end stage renal disease)     TTS hemodialysis  . GERD (gastroesophageal reflux disease)   . Hypertension 02/02/2008    in history  . Mental retardation 02/02/2008  . Mute   . Seizures     "non in a while at home". none in past year .  Marland Kitchen. Complication of anesthesia     in the past BP has dropped    Assessment: 63 YOM on phenytoin PTA for history of seizures. Phenytoin level today was 5 which corrects to 11.63 based on albumin of 3.3.   PTA dose: phenytoin ER 200mg  qHS  Goal of Therapy:  Phenytoin level 10-4420mcg/mL  Plan:  - Continue current dose of phenytoin ER 200mg  qHS - Obtain phenytoin level as needed  Jedi Catalfamo A. Lenon AhmadiBinz, PharmD Clinical Pharmacist - Resident Pager: [phone] Pharmacy: [phone] 04/25/2014 4:08 PM

## 2014-04-26 DIAGNOSIS — E2749 Other adrenocortical insufficiency: Secondary | ICD-10-CM

## 2014-04-26 LAB — CORTISOL: Cortisol, Plasma: 12.1 ug/dL

## 2014-04-26 LAB — CBC
HCT: 26.6 % — ABNORMAL LOW (ref 39.0–52.0)
Hemoglobin: 8.5 g/dL — ABNORMAL LOW (ref 13.0–17.0)
MCH: 29.2 pg (ref 26.0–34.0)
MCHC: 32 g/dL (ref 30.0–36.0)
MCV: 91.4 fL (ref 78.0–100.0)
PLATELETS: 147 10*3/uL — AB (ref 150–400)
RBC: 2.91 MIL/uL — ABNORMAL LOW (ref 4.22–5.81)
WBC: 13.6 10*3/uL — AB (ref 4.0–10.5)

## 2014-04-26 LAB — BASIC METABOLIC PANEL
Anion gap: 14 (ref 5–15)
BUN: 22 mg/dL (ref 6–23)
CO2: 27 mEq/L (ref 19–32)
CREATININE: 5.37 mg/dL — AB (ref 0.50–1.35)
Calcium: 8.3 mg/dL — ABNORMAL LOW (ref 8.4–10.5)
Chloride: 99 mEq/L (ref 96–112)
GFR calc non Af Amer: 10 mL/min — ABNORMAL LOW (ref >90)
GFR, EST AFRICAN AMERICAN: 12 mL/min — AB (ref >90)
Glucose, Bld: 94 mg/dL (ref 70–99)
Potassium: 4.5 mEq/L (ref 3.7–5.3)
Sodium: 140 mEq/L (ref 137–147)

## 2014-04-26 LAB — GLUCOSE, CAPILLARY
GLUCOSE-CAPILLARY: 99 mg/dL (ref 70–99)
GLUCOSE-CAPILLARY: 148 mg/dL — AB (ref 70–99)
GLUCOSE-CAPILLARY: 159 mg/dL — AB (ref 70–99)
Glucose-Capillary: 123 mg/dL — ABNORMAL HIGH (ref 70–99)
Glucose-Capillary: 95 mg/dL (ref 70–99)
Glucose-Capillary: 162 mg/dL — ABNORMAL HIGH (ref 70–99)

## 2014-04-26 MED ORDER — PREDNISONE 5 MG PO TABS: 10.0000 mg | ORAL_TABLET | Freq: Every day | ORAL | Status: DC

## 2014-04-26 MED ORDER — BISACODYL 5 MG PO TBEC: 5.0000 mg | DELAYED_RELEASE_TABLET | Freq: Every day | ORAL | Status: DC | PRN

## 2014-04-26 MED ORDER — PREDNISONE 10 MG PO TABS
10.0000 mg | ORAL_TABLET | Freq: Every day | ORAL | Status: DC
  Administered 2014-04-26 – 2014-04-27 (×2): 10 mg via ORAL
  Filled 2014-04-26 (×4): qty 1

## 2014-04-26 MED ORDER — NEPRO/CARBSTEADY PO LIQD: 237.0000 mL | Freq: Two times a day (BID) | ORAL | Status: DC

## 2014-04-26 NOTE — Progress Notes (Signed)
Patient ID: [name], male   DOB: 1950/12/08, 63 y.o.   MRN: [id number] Attending physician note: I personally examined this patient together with resident physician Dr. Farley LyAdam Rothman in the test the accuracy of his evaluation and management plan recorded above. Patient appears to be back to his prehospital baseline. He will continue to work with physical therapy. Discharge planning in progress.

## 2014-04-26 NOTE — Progress Notes (Signed)
Occupational Therapy Treatment Patient Details Name: [name] MRN: [id number] DOB: July 02, 1951 Today's Date: 04/26/2014    History of present illness 63 yo baseline non verbal with ESRD on HD admitted for revision of thrombosed R AV graft. The procedure was complicated by injury to left superficial femoral artery requiring patch and significant blood loss. Pt with multiple problems with HD access.   OT comments  Pt making progress with functional goals. Updated d/c recommendation to return tp group home with HH and 24 hr sup. Pt is still quite impulsive and will need 24 hour supervision/assist for safety/balance for ADLs and ADL mobility at home; Per PT's discussion with Case Mgr and SW note, they will be able to meet Mr. Durrer' mobility needs at his current group home. OT will continue to follow acutely    Follow Up Recommendations  Supervision/Assistance - 24 hour;Home health OT    Equipment Recommendations  None recommended by OT    Recommendations for Other Services      Precautions / Restrictions Precautions Precautions: Fall Precaution Comments: Very impulsive, Significant fall risk Restrictions Weight Bearing Restrictions: No       Mobility Bed Mobility               General bed mobility comments: in chair upon arrival  Transfers Overall transfer level: Needs assistance Equipment used: 1 person hand held assist;2 person hand held assist Transfers: Sit to/from Stand Sit to Stand: Mod assist         General transfer comment: Will initiate sit to stand with cues and at times impulsively; Stood from recliner and from toilet; Required mod assist largely for steadying/balance and safety with getting up; Tends to sit impulsively, second person helpful to manage lines    Balance   Sitting-balance support: No upper extremity supported;Feet supported Sitting balance-Leahy Scale: Good Sitting balance - Comments: Sat Edge of Chair in unsupported sitting, and able  to reach outside of base of support (at one point reaching for a card that had fallen on the floor, and recovered with min asssit)   Standing balance support: Single extremity supported;Bilateral upper extremity supported;During functional activity Standing balance-Leahy Scale: Poor Standing balance comment: very unsteady in standing                   ADL Overall ADL's : Needs assistance/impaired     Grooming: Wash/dry hands;Wash/dry face;Standing;Moderate assistance                   Toilet Transfer: Moderate assistance;Grab bars;Regular Toilet           Functional mobility during ADLs: Moderate assistance;Cueing for safety        Vision  no change from baseline                              Cognition   Behavior During Therapy: [facility] for tasks assessed/performed;Impulsive Overall Cognitive Status: History of cognitive impairments - at baseline                                                 General Comments  Pt cooperative    Pertinent Vitals/ Pain       No c/o pain, VSS  Home Living Group home  Prior Functioning/Environment    Independent per pt's sister. Pt only had assist with bathing           Frequency Min 2X/week     Progress Toward Goals  OT Goals(current goals can now be found in the care plan section)  Progress towards OT goals: Progressing toward goals     Plan Discharge plan needs to be updated    Co-evaluation    PT/OT/SLP Co-Evaluation/Treatment: Yes Reason for Co-Treatment: For patient/therapist safety PT goals addressed during session: Mobility/safety with mobility;Balance OT goals addressed during session: ADL's and self-care      End of Session Equipment Utilized During Treatment: Gait belt   Activity Tolerance Patient tolerated treatment well   Patient Left with nursing/sitter in room;in chair;with call bell/phone within  reach             Time: 8119-1478 OT Time Calculation (min): 25 min  Charges: OT General Charges $OT Visit: 1 Procedure OT Treatments $Therapeutic Activity: 8-22 mins  Galen Manila 04/26/2014, 2:34 PM

## 2014-04-26 NOTE — Progress Notes (Signed)
Physical Therapy Treatment Patient Details Name: [name] MRN: [id number] DOB: May 11, 1951 Today's Date: 04/26/2014    History of Present Illness 63 yo baseline non verbal with ESRD on HD admitted for revision of thrombosed R AV graft. The procedure was complicated by injury to left superficial femoral artery requiring patch and significant blood loss. Pt with multiple problems with HD access.    PT Comments    Progressing with PT, showing increased activity tolerance and better participation; Pt is still quite impulsive and will need 24 hour supervision/assist for safety/balance at home; Per discussion with Case Mgr and SW note, I believe they will be able to meet [name]' mobility needs at his current group home  While he will need assist, [name] will be able to transfer to HD recliner and his caregivers will be able to manage;   Recommend HHPT/OT to aid in return to prior functional level at home, and recommend any assistive devices or DME needed in the home  Update Dr. Valentino Saxon re: PT session, recommendations   Follow Up Recommendations  Home health PT;Supervision/Assistance - 24 hour     Equipment Recommendations  Other (comment) (Will rely on HHPT/OT to make assistive device or DME recs; at this point, I wonder if a RW or cane would actually increase pt's risk of having a fall)    Recommendations for Other Services       Precautions / Restrictions Precautions Precautions: Fall Precaution Comments: Very impulsive, Significant fall risk    Mobility  Bed Mobility               General bed mobility comments: in chair upon arrival  Transfers Overall transfer level: Needs assistance Equipment used: 1 person hand held assist;2 person hand held assist Transfers: Sit to/from Stand Sit to Stand: Mod assist         General transfer comment: Will initiate sit to stand with cues and at times impulsively; Stood from recliner and from toilet; Required mod assist  largely for steadying/balance and safety with getting up; Tends to sit impulsively, second person helpful to manage lines  Ambulation/Gait Ambulation/Gait assistance: +2 safety/equipment;Mod assist Ambulation Distance (Feet): 35 Feet (5+15+15) Assistive device: 2 person hand held assist Gait Pattern/deviations: Shuffle;Trunk flexed Gait velocity: decr   General Gait Details: Ambulate with a shuffel pattern and trunk flexed; Needs handheld assist for balance and safety   Stairs            Wheelchair Mobility    Modified Rankin (Stroke Patients Only)       Balance   Sitting-balance support: Bilateral upper extremity supported Sitting balance-Leahy Scale: Good Sitting balance - Comments: Sat Edge of Chair in unsupported sitting, and able to reach outside of base of support (at one point reaching for a card that had fallen on the floor, and recovered with min asssit)   Standing balance support: Bilateral upper extremity supported Standing balance-Leahy Scale: Poor Standing balance comment: very unsteady in standing                    Cognition Arousal/Alertness: Awake/alert Behavior During Therapy: WFL for tasks assessed/performed;Impulsive (extremely impulsive) Overall Cognitive Status: History of cognitive impairments - at baseline                      Exercises      General Comments        Pertinent Vitals/Pain no apparent distress     Home Living  Prior Function            PT Goals (current goals can now be found in the care plan section) Acute Rehab PT Goals PT Goal Formulation: Patient unable to participate in goal setting Time For Goal Achievement: 05/04/14 Potential to Achieve Goals: Good Progress towards PT goals: Progressing toward goals    Frequency  Min 3X/week    PT Plan Current plan remains appropriate    Co-evaluation PT/OT/SLP Co-Evaluation/Treatment: Yes Reason for Co-Treatment: For  patient/therapist safety;Other (comment) (to maximize ability to participate) PT goals addressed during session: Mobility/safety with mobility;Balance       End of Session Equipment Utilized During Treatment: Gait belt Activity Tolerance: Patient tolerated treatment well Patient left: in chair;with nursing/sitter in room;Other (comment) (Moved chair directly in front of window)     Time: 1610-9604 PT Time Calculation (min): 27 min  Charges:  $Gait Training: 8-22 mins                    G Codes:      Van Clines Hamff 04/26/2014, 11:53 AM  Van Clines, PT  Acute Rehabilitation Services Pager [phone] Office [phone]

## 2014-04-26 NOTE — Progress Notes (Addendum)
Clinical Social Work Department BRIEF PSYCHOSOCIAL ASSESSMENT 04/26/2014  Patient:  [name],[name]     Account Number:  [id number]     Admit date:  04/10/2014  Clinical Social Worker:  Harless NakayamaAMBELAL,[name], LCSWA  Date/Time:  04/26/2014 11:00 AM  Referred by:  Physician  Date Referred:  04/26/2014 Referred for  Other - See comment   Other Referral:   Pt admitted from Group Home (?)   Interview type:  Family Other interview type:   Spoke with pt sister [name] and also facility contact [employer] [name]    PSYCHOSOCIAL DATA Living Status:  FACILITY Admitted from facility:  Other Level of care:   Primary support name:  [name] Primary support relationship to patient:  S[place]LING Degree of support available:   Pt has strong support from family and also administrators at [facility]    CURRENT CONCERNS Current Concerns  Post-Acute Placement   Other Concerns:    SOCIAL WORK ASSESSMENT / PLAN CSW informed pt was potentially admitted from group home and medical team is unclear if they will be able to continue to manage pt and transport safely to dialysis. CSW called second contact on pt facesheet, [name]. [name] is the administrator of [name]). CSW explained pt current medical/physical condition and inquired about pt baseline. CSW was informed that PTA pt was walking and fairly independent. However, Mr [name] did explain that after pt past admission he was also in the same condition he is in now and they were able to manage. [name] said they are comfortable getting pt into and out of a wheelchair. The [facility] uses a transportation company to get pt to and from dialysis ([address] [facility]) so [name] said this would not be an issue. Pt has used home health services before but [name] cannot remember the company. [name] [facility] Per [name], pt often does take advantage if too  much assistance or choice is offered. [name] wants to insure pt receives what is necessary for pt to be safe and no more at discharge. If needed, they would want to adjust equipment/services after they see how pt does once he returns. CSW to provide [facility] with FL2 and dc summary at discharge. They are requesting no additional clinicals at this time.    CSW also called pt sister [name] and informed of conversation with [name]. [name] is in agreement that pt should return when medically stable. She was also in agreement that pt will likely do more with therapy at home when familiar people are around and pt does not refuse services. [name] informed CSW that [name] will likely need to assist with transportation but if there are any issues she can be contacted as well.   Assessment/plan status:  Psychosocial Support/Ongoing Assessment of Needs Other assessment/ plan:   Information/referral to community resources:   None needed at this time    PATIENT'S/FAMILY'S RESPONSE TO PLAN OF CARE: Pt family and facility administrator very cooperative and pleasant. Everyone in agreement that pt should return to [facility].       [name], LCSWA [phone]

## 2014-04-26 NOTE — Progress Notes (Signed)
Subjective: Mr. Correa had no acute events overnight. He was the most interactive that I have seen him this morning. He was sitting upright in his chair by the bed without difficulty with the television on in front of him. He was very engaging with pointing and grunts. He was kept pointing out the window. He followed commands such as to lean forward during the respiratory exam and open his mouth. He appeared very happy.  Vital signs in last 24 hours: Filed Vitals:   04/25/14 1100 04/25/14 1650 04/25/14 2017 04/26/14 0455  BP: 121/61 99/47 87/50  103/53  Pulse: 106 100 101 89  Temp: 96.8 F (36 C) 98.5 F (36.9 C) 97.9 F (36.6 C) 97.5 F (36.4 C)  TempSrc: Oral Oral Oral Oral  Resp: 18 18 17 20   Height:      Weight:      SpO2:  97% 93% 97%   Weight change:   Intake/Output Summary (Last 24 hours) at 04/26/14 1113 Last data filed at 04/26/14 0900  Gross per 24 hour  Intake    480 ml  Output      2 ml  Net    478 ml   BP 103/53  Pulse 89  Temp(Src) 97.5 F (36.4 C) (Oral)  Resp 20  Ht 5' (1.524 m)  Wt 139 lb 1.8 oz (63.1 kg)  BMI 27.17 kg/m2  SpO2 97%  General Appearance:    Energetic and enthusiastic, responsive, appears comfortable sitting upright in chair  HEENT:    Normocephalic, without obvious abnormality, atraumatic, maintained eye contact. PERRL. Moist mucous membranes with fleshy growths from his lower gums  Lungs:     Difficult to appreciate lung sounds as patient did not inspire with effort but he did lean forward, respirations unlabored   Heart:    Regular rate and rhythm, S1 and S2 normal, 2-3/6 blowing systolic ejection murmur best heard at the apex unchanged  Extremities:   L groin with triple lumen catheter in place, scar tissue palpable. R groin HD cath in place.  Pulses:   2+ and symmetric radial, difficult to appreciate in feet which were warm   Lab Results: Basic Metabolic Panel:  Recent Labs  16/10/96 0515 04/26/14 0518  NA 139 140  K 5.8* 4.5   CL 98 99  CO2 26 27  GLUCOSE 97 94  BUN 40* 22  CREATININE 7.99* 5.37*  CALCIUM 8.3* 8.3*  PHOS 3.1  --    Liver Function Tests:  Recent Labs  04/25/14 0515  ALBUMIN 3.3*   No results found for this basename: LIPASE, AMYLASE,  in the last 72 hours No results found for this basename: AMMONIA,  in the last 72 hours  CBC:  Recent Labs  04/25/14 0515 04/26/14 0518  WBC 16.8* 13.6*  NEUTROABS 15.3*  --   HGB 9.4* 8.5*  HCT 29.4* 26.6*  MCV 89.9 91.4  PLT 222 147*   Cardiac Enzymes: No results found for this basename: CKTOTAL, CKMB, CKMBINDEX, TROPONINI,  in the last 72 hours BNP: No results found for this basename: PROBNP,  in the last 72 hours D-Dimer: No results found for this basename: DDIMER,  in the last 72 hours CBG:  Recent Labs  04/25/14 1227 04/25/14 1659 04/25/14 2016 04/26/14 0009 04/26/14 0452 04/26/14 0732  GLUCAP 70 134* 112* 123* 95 99   Hemoglobin A1C: No results found for this basename: HGBA1C,  in the last 72 hours Fasting Lipid Panel: No results found for this basename: CHOL,  HDL, LDLCALC, TRIG, CHOLHDL, LDLDIRECT,  in the last 72 hours Thyroid Function Tests: No results found for this basename: TSH, T4TOTAL, FREET4, T3FREE, THYROIDAB,  in the last 72 hours Anemia Panel:  Recent Labs  04/25/14 0515  FERRITIN 176  TIBC 189*  IRON 16*   Coagulation: No results found for this basename: LABPROT, INR,  in the last 72 hours Urine Drug Screen: Drugs of Abuse  No results found for this basename: labopia,  cocainscrnur,  labbenz,  amphetmu,  thcu,  labbarb    Alcohol Level: No results found for this basename: ETH,  in the last 72 hours Urinalysis: No results found for this basename: COLORURINE, APPERANCEUR, LABSPEC, PHURINE, GLUCOSEU, HGBUR, BILIRUBINUR, KETONESUR, PROTEINUR, UROBILINOGEN, NITRITE, LEUKOCYTESUR,  in the last 72 hours Misc. Labs: Phenytoin 5  Micro Results: Recent Results (from the past 240 hour(s))  CULTURE, BLOOD  (ROUTINE X 2)     Status: None   Collection Time    04/24/14  6:30 PM      Result Value Ref Range Status   Specimen Description BLOOD LEFT CENTRAL LINE   Final   Special Requests BOTTLES DRAWN AEROBIC AND ANAEROBIC 10CC   Final   Culture  Setup Time     Final   Value: 04/25/2014 00:22     Performed at [facility]   Culture     Final   Value:        BLOOD CULTURE RECEIVED NO GROWTH TO DATE CULTURE WILL BE HELD FOR 5 DAYS BEFORE ISSUING A FINAL NEGATIVE REPORT     Performed at [facility]   Report Status PENDING   Incomplete   Studies/Results: Dg Chest Port 1 View  04/24/2014   CLINICAL DATA:  Elevated white blood cell count.  EXAM: PORTABLE CHEST - 1 VIEW  COMPARISON:  Chest x-ray 03/29/2014.  FINDINGS: Mediastinum and hilar structures normal. Poor inspiration with bibasilar atelectasis and/or infiltrates. Bibasilar pneumonia suspected. Small left pleural effusion cannot be excluded. No pneumothorax. Heart size normal.  IMPRESSION: 1. Findings consistent with poor inspiration with bibasilar atelectasis and/or pneumonia, particularly on the left.  2.  Small left pleural effusion.   Electronically Signed   By: Maisie Fushomas  Register   On: 04/24/2014 14:09   Medications: I have reviewed the patient's current medications. Scheduled Meds: . atorvastatin  20 mg Oral QHS  . cinacalcet  30 mg Oral BID WC  . clonazePAM  0.25 mg Oral BID  . darbepoetin (ARANESP) injection - DIALYSIS  150 mcg Intravenous Q Tue-HD  . doxercalciferol  11 mcg Intravenous Q T,Th,Sa-HD  . feeding supplement (NEPRO CARB STEADY)  237 mL Oral BID BM  . insulin aspart  0-15 Units Subcutaneous 6 times per day  . metoCLOPramide (REGLAN) injection  10 mg Intravenous 3 times per day  . midodrine  10 mg Oral TID WC  . multivitamin  1 tablet Oral QHS  . pantoprazole  40 mg Oral Q1200  . phenytoin  200 mg Oral QHS  . predniSONE  10 mg Oral Q breakfast  . senna  1 tablet Oral BID  . sodium chloride  10-40 mL  Intracatheter Q12H   Continuous Infusions: . sodium chloride 10 mL/hr at 04/24/14 0828  . vasopressin (PITRESSIN) infusion - *FOR SHOCK* Stopped (04/17/14 1200)   PRN Meds:.acetaminophen, acetaminophen, bisacodyl, diphenhydrAMINE, fentaNYL, ondansetron, phenol, sodium chloride Assessment/Plan: Active Problems:   Clotted renal dialysis arteriovenous graft   Palliative care encounter  1. ESRD on HD: Patient is tolerating HD  via R groin catheter well and had a session yesterday. His main issue is access as his only access is a triple lumen catheter in the L groin and a temporary HD catheter in the R groin. In order to receive outpatient HD, he needs to ambulate and sit upright. Patient needs to be evaluated and work with PT/OT to ensure he can sit upright to tolerate outpatient HD. PT/OT came by yesterday after HD and said he was too tired to cooperate. He appeared to be tolerating sitting upright in his chair when I examined him this morning and PT/OT says they will make working with him today a priority -Appreciate renal recs  -TTS HD -PT/OT evaluation for outpatient HD  2. Hypotension: BP has been 80s-100s/40s-60s this morning which is normal for him. He was a little tachycardic to the 100s.  Likely due to acquired adrenal insufficiency v acute shock. -Goal SBP > 75, consider pressors if hypotensive below baseline -Continue midodrine 10 mg TID -Decreased hydrocortisone to 25 mg iv BID yesterday and transition to prednisone 10 mg po daily equivalent   3. Leukocytosis: WBC at 14 today down from 17  yesterday but no fever so infection seems unlikely. In addition, he appears to be improving clinically the past two days since his transfusion which makes infection less likely. Patient does not produce urine so UTI ruled out. CXR appears most consistent with atelectasis. Will continue to have low index of suspicion for infection but currently holding off on antibiotics. He is on steroids which we are  weaning. -cont to monitor and put on vancomycin/zosyn if he spikes a fever or other sign of infection -follow-up BCx x 2  4. Anemia: Patient hemoglobin has decreased to 8.5 this morning from 9.4 and he is a little tachcardic. His level of engagement suggests that he is tolerating the anemia okay. Iron level low end of normal and TIBC is low but ferritin normal which can be due to anemia of chronic disease related to his kidneys. -follow-up fecal occult blood test -continue to monitor  5. History of seizures: Phenytoin level is appropriate after correcting for alumin (~11).  -appreciate pharmacy -cont phenytoin, clonazepam   6. Acquired adrenal insufficiency: Likely contributing to hypotension.  BP has improved slightly since hydrocortisone wean began -cont hydrocortisone to 25 mg iv q6h and transition to prednisone 10 mg po daily equivalent   7. Mental retardation: Patient appears at baseline from chart review and per family. Non-verbal but very engaging today. He maintained eye contact and pointed at the window and windowsill. He was the most cooperative during exam he has been with me.  Dispo: Disposition is deferred at this time, awaiting improvement of current medical problems.  Anticipated discharge in approximately 1-2 day(s).   The patient does have a current PCP Courtney Paris(Eden W Jones, MD) and [facility] after [facility].  The patient does not know have transportation limitations that hinder transportation to clinic appointments.  .Services Needed at time of discharge: Y = Yes, Blank = No PT: Y  OT: Y  RN:   Equipment:   Other:     LOS: 16 days   Lorenda HatchetAdam L Morse Brueggemann, MD 04/26/2014, 11:13 AM

## 2014-04-26 NOTE — Progress Notes (Signed)
[facility] Progress Note  Subjective:   Sitting up in recliner.  Pointing/gesturing out the window.   Objective Filed Vitals:   04/25/14 1100 04/25/14 1650 04/25/14 2017 04/26/14 0455  BP: 1_0 103/53  Pulse: 106 100 101 89  Temp: 96.8 F (36 C) 98.5 F (36.9 C) 97.9 F (36.6 C) 97.5 F (36.4 C)  TempSrc: Oral Oral Oral Oral  Resp: _1 Height:      Weight:      SpO2:  97% 93% 97%   Physical Exam General: Mute. Animated, NAD. Seems to be back to his baseline. Heart: RRR Lungs: Mostly clear. No overt wheezes/rhonchi Abdomen: Soft, NT, +BS Extremities: No LE edema Dialysis Access:  Rt fem TDC  Dialysis Orders: T[facility] 36mn 56 kg 2K/2Ca 400/A1.5 P4 Heparin tight hep due to fem art injury (3500 U at center) New R thigh TDC Hectorol 11 mcg No Aranesp or Veno - when last on Epo was on 5000 - no Fe studies since May   Assessment/Plan: 1. Shock - improved, off pressors for BP again; and would not restart,Dr. SJonnie Finnerd/w primary MD on 7/3 2. Anemia due to CKD / ABLA - In setting of fem artery injury/bleed. Hgb 8.3 s/p  PRBCs on 7/6. Tsat 35% on 7/5. Aranesp 150 qTues. Follow CBC.  3. Limited HD access - Failed L thigh AVG- unsuccessful attempt to salvage AVG on 6/22. R thigh TDC placed on 6/23 - purported last option. 4. ESRD - TTS , K+ 4.5. HD  tomorrow 5. Chronic hypotension/Vol - SBPs 80s-100s on midodrine. Wgts variable here. UF as tolerated 6. HPTH - Ca 8.3. Low phos resolving.  Continue holding binders for now. Continue Hectorol 7. Nutrition - Alb 3.3, renal diet; multivitamin, supplements. High risk for inadequate intake 8. Adrenal insufficiency - on IV steroids- titrating down 9. Mental disability - pt is at his baseline, which is normally mute and will communicate with hand gestures sometimes 10. EOL Issues - PC team has met with family. Patient is now a DNR. Family wishes to continue with HD and to treat any reversible issues  but wish to avoid invasive procedures/ ICU care/ prolonged suffering. Will need demonstrate ability transfer, remain alert and dialyze upright in a recliner in order to be a candidate for outpatient dialysis.  Appreciate PC assistance. PT eval planned with familiar caregivers to encourage participation.   KCollene Leyden WCletus Gash PA-C [facility] Pager [phone]/05/2014,10:38 AM  LOS: 16 days   Renal Attending: Pt improved and near baseline mental state as best can be assessed. Next HD tx for Thursday. Agree with note articulated by Ms WCletus Gash Charleigh Correnti C    Additional Objective Labs: Basic Metabolic Panel:  Recent Labs Lab 04/20/14 1722 04/21/14 0410  04/24/14 0452 04/25/14 0515 04/26/14 0518  NA 138 140  < > 137 139 140  K 3.8 3.9  < > 5.5* 5.8* 4.5  CL 95* 98  < > 98 98 99  CO2 28 29  < > _2 GLUCOSE 129* 126*  < > 100* 97 94  BUN 24* 9  < > 27* 40* 22  CREATININE 6.21* 3.39*  < > 5.83* 7.99* 5.37*  CALCIUM 7.9* 8.1*  < > 8.1* 8.3* 8.3*  PHOS 3.4 2.0*  --   --  3.1  --   < > = values in this interval not displayed. Liver Function Tests:  Recent Labs Lab 04/20/14 1722 04/25/14  0515  ALBUMIN 3.0* 3.3*   CBC:  Recent Labs Lab 04/23/14 0558 04/24/14 0452 04/24/14 1830 04/25/14 0515 04/26/14 0518  WBC 16.2* 14.1* 16.3* 16.8* 13.6*  NEUTROABS  --   --   --  15.3*  --   HGB 7.8* 6.9* 9.5* 9.4* 8.5*  HCT 24.6* 21.8* 29.7* 29.4* 26.6*  MCV 101.7* 102.3* 90.0 89.9 91.4  PLT 245 210 214 222 147*   Blood Culture    Component Value Date/Time   SDES BLOOD LEFT CENTRAL LINE 04/24/2014 1830   SPECREQUEST BOTTLES DRAWN AEROBIC AND ANAEROBIC 10CC 04/24/2014 1830   CULT  Value:        BLOOD CULTURE RECEIVED NO GROWTH TO DATE CULTURE WILL BE HELD FOR 5 DAYS BEFORE ISSUING A FINAL NEGATIVE REPORT Performed at [facility] 04/24/2014 1830   REPTSTATUS PENDING 04/24/2014 1830    Cardiac Enzymes: No results found for this basename: CKTOTAL, CKMB,  CKMBINDEX, TROPONINI,  in the last 168 hours CBG:  Recent Labs Lab 04/25/14 1659 04/25/14 2016 04/26/14 0009 04/26/14 0452 04/26/14 0732  GLUCAP 134* 112* 123* 95 99   Iron Studies:  Recent Labs  04/23/14 1250 04/25/14 0515  IRON 66 16*  TIBC 188* 189*  TRANSFERRIN 144*  --   FERRITIN 158 176   Studies/Results: Dg Chest Port 1 View  04/24/2014   CLINICAL DATA:  Elevated white blood cell count.  EXAM: PORTABLE CHEST - 1 VIEW  COMPARISON:  Chest x-ray 03/29/2014.  FINDINGS: Mediastinum and hilar structures normal. Poor inspiration with bibasilar atelectasis and/or infiltrates. Bibasilar pneumonia suspected. Small left pleural effusion cannot be excluded. No pneumothorax. Heart size normal.  IMPRESSION: 1. Findings consistent with poor inspiration with bibasilar atelectasis and/or pneumonia, particularly on the left.  2.  Small left pleural effusion.   Electronically Signed   By: Marcello Moores  Register   On: 04/24/2014 14:09   Medications: . sodium chloride 10 mL/hr at 04/24/14 4540  . vasopressin (PITRESSIN) infusion - *FOR SHOCK* Stopped (04/17/14 1200)   . atorvastatin  20 mg Oral QHS  . cinacalcet  30 mg Oral BID WC  . clonazePAM  0.25 mg Oral BID  . darbepoetin (ARANESP) injection - DIALYSIS  150 mcg Intravenous Q Tue-HD  . doxercalciferol  11 mcg Intravenous Q T,Th,Sa-HD  . feeding supplement (NEPRO CARB STEADY)  237 mL Oral BID BM  . insulin aspart  0-15 Units Subcutaneous 6 times per day  . metoCLOPramide (REGLAN) injection  10 mg Intravenous 3 times per day  . midodrine  10 mg Oral TID WC  . multivitamin  1 tablet Oral QHS  . pantoprazole  40 mg Oral Q1200  . phenytoin  200 mg Oral QHS  . predniSONE  10 mg Oral Q breakfast  . senna  1 tablet Oral BID  . sodium chloride  10-40 mL Intracatheter Q12H

## 2014-04-26 NOTE — Discharge Summary (Signed)
Name: [name] MRN: [id number] DOB: January 14, 1951 63 y.o. PCP: Courtney Paris, MD  Date of Admission: 04/10/2014  6:28 AM Date of Discharge: 04/27/2014 Attending Physician: Levert Feinstein, MD  Discharge Diagnosis:  Active Problems:   Clotted renal dialysis arteriovenous graft   Palliative care encounter  Discharge Medications:   Medication List    STOP taking these medications       lidocaine 4 % cream  Commonly known as:  LMX      TAKE these medications       atorvastatin 20 MG tablet  Commonly known as:  LIPITOR  Take 20 mg by mouth at bedtime.     bisacodyl 5 MG EC tablet  Commonly known as:  DULCOLAX  Take 1 tablet (5 mg total) by mouth daily as needed for moderate constipation.     cinacalcet 30 MG tablet  Commonly known as:  SENSIPAR  Take 30 mg by mouth 2 (two) times daily with a meal.     clonazePAM 0.5 MG tablet  Commonly known as:  KLONOPIN  Take 0.25 mg by mouth 2 (two) times daily.     feeding supplement (NEPRO CARB STEADY) Liqd  Take 237 mLs by mouth 2 (two) times daily between meals.     folic acid-vitamin b complex-vitamin c-selenium-zinc 3 MG Tabs tablet  Take 1 tablet by mouth daily.     midodrine 10 MG tablet  Commonly known as:  PROAMATINE  Take 15 mg by mouth 3 (three) times daily.     pantoprazole 40 MG tablet  Commonly known as:  PROTONIX  Take 40 mg by mouth daily.     phenytoin 100 MG ER capsule  Commonly known as:  DILANTIN  Take 200 mg by mouth at bedtime.     predniSONE 5 MG tablet  Commonly known as:  DELTASONE  Take 2 tablets (10 mg total) by mouth daily with breakfast. Take 10 mg for 3 days then 5 until finished     sevelamer carbonate 800 MG tablet  Commonly known as:  RENVELA  Take 800 mg by mouth 3 (three) times daily with meals.        Disposition and follow-up:   [name] was discharged from [facility] in Stable condition.  At the hospital follow up visit please address:  1.   Hypotension, energy level  2.  Labs / imaging needed at time of follow-up: none  3.  Pending labs/ test needing follow-up: final BCx x 2 (collected 7/6)  Follow-up Appointments: Follow-up Information   Follow up with Evelena Peat, DO On 05/03/2014.   Specialty:  Internal Medicine   Contact information:   [address] [place] [address] [phone]       Discharge Instructions: Discharge Instructions   Diet - low sodium heart healthy    Complete by:  As directed      Diet - low sodium heart healthy    Complete by:  As directed      Increase activity slowly    Complete by:  As directed      Increase activity slowly    Complete by:  As directed            Consultations: Treatment Team:  Garnetta Buddy, MD [facility]  Procedures Performed:  Dg Chest Port 1 View  04/24/2014   CLINICAL DATA:  Elevated white blood cell count.  EXAM: PORTABLE CHEST - 1 VIEW  COMPARISON:  Chest  x-ray 03/29/2014.  FINDINGS: Mediastinum and hilar structures normal. Poor inspiration with bibasilar atelectasis and/or infiltrates. Bibasilar pneumonia suspected. Small left pleural effusion cannot be excluded. No pneumothorax. Heart size normal.  IMPRESSION: 1. Findings consistent with poor inspiration with bibasilar atelectasis and/or pneumonia, particularly on the left.  2.  Small left pleural effusion.   Electronically Signed   By: Maisie Fus  Register   On: 04/24/2014 14:09   Dg Chest Port 1 View  04/17/2014   CLINICAL DATA:  Clinical pneumonitis -pneumonia  EXAM: PORTABLE CHEST - 1 VIEW  COMPARISON:  Portable chest x-ray of April 10, 2014  FINDINGS: The lungs are adequately inflated. The interstitial markings have increased bilaterally. The hemidiaphragms remain visible. There is no pleural effusion. The cardiac silhouette is top-normal in size. The central pulmonary vascularity is mildly prominent. The bony structures are unremarkable.  IMPRESSION: New mild pulmonary interstitial edema of cardiac  or noncardiac cause.   Electronically Signed   By: David  [place]   On: 04/17/2014 08:13   Dg Chest Port 1 View  04/10/2014   CLINICAL DATA:  Renal failure, postoperative evaluation, fluid overload  EXAM: PORTABLE CHEST - 1 VIEW  COMPARISON:  None.  FINDINGS: Low lung volumes. Normal heart size and vascularity. Minor left base atelectasis is evident. Negative for edema or CHF. No pneumothorax. Trachea is midline. Atherosclerosis of the aorta. Gastric distention noted in the left upper quadrant.  IMPRESSION: Low volume exam with left basilar atelectasis.   Electronically Signed   By: Ruel Favors M.D.   On: 04/10/2014 17:05   Dg Abd Portable 1v  04/11/2014   CLINICAL DATA:  Femoral hemodialysis catheter  EXAM: PORTABLE ABDOMEN - 1 VIEW  COMPARISON:  Portable exam 1759 hr compared to 12/14/2012  FINDINGS: Dual lumen RIGHT femoral dialysis catheter with tip projecting at L1-L2 disc space level.  LEFT femoral line tip projects over LEFT supra-acetabular region.  Visualized bowel gas pattern normal.  Minimally prominent stool in rectum.  Bones demineralized.  IMPRESSION: Tip of RIGHT femoral line projects at L1-L2 disc space.  Additional LEFT femoral line tip projecting over LEFT supra-acetabular region.   Electronically Signed   By: Ulyses Southward M.D.   On: 04/11/2014 18:09   Admission HPI: [name] is a 63 year old man with ESRD on HD, mental retardation (non-verbal), a history of seizures, chronic hypotension, and CHF transferring from the ICU to the IM teaching service. He underwent a thrombectomy and revision of a thrombosed L AV graft on 6/22 which was complicated by an femoral artery injury requiring patching with significant blood loss. He had a temporary catheter placed in the R groin which was replaced with permanent access in the R groin on 6/23. He was admitted to the ICU as he was hypotensive requiring pressors. He was on norepinephrine, vasopressin, hydrocortisone sodium succinate and midodrine 10  mg TID in the ICU. His condition has improved and he has been weaned off the vasopressin and norepinephrine. His SBP is usually 90's at highest. He has undergone his TTS HD schedule. He is being transferred to the internal medicine team from the ICU on 04/22/14.   Hospital Course by problem list: Active Problems:   Clotted renal dialysis arteriovenous graft   Palliative care encounter   #ESRD on HD: Mr. Shellman tolerated HD via R groin catheter without any complications while followed by medicine on his T/Th/Sa schedule. His main issue was access as his only access was a triple lumen catheter in the L groin  and a temporary HD catheter in the R groin. In order to receive outpatient HD, he needed to ambulate and sit upright. Patient was evaluated and worked with PT/OT to ensure he could sit upright and tolerate outpatient HD. This was all discussed during a family meeting with palliative care and his sisters Aura Camps and Cookie as well as other goals of care. The decision was made to switch from full code to DNR and documents were signed. He received darbepoetin 150 mcg q2weekly, doxercalciferol 11 mcg T/Th/Sa and followed by renal.  #Hypotension: Upon transfer, the patient was initially 60s-90s/30s-50s and felt to be secondary to acute adrenal insufficiency v acute shock. He was weaned off norepinephrine and vasopressin prior to our acceptance but maintained on midodrine 10 mg TID and hydrocortisone 50 mg q6h. The steroid was tapered as tolerated with good results in his BP slowly rose to normotensive. He was transitioned to prednisone 10 mg po daily the day prior to discharge. We recommend a 10 day taper of  3 days 10 mg then 5 mg until script empty. At the time of discharge his BP was 90s-120s/50s-70s.  #Leukocytosis: Patient WBC went from 8.8 on 7/4 to 16.2 on 7/5 without fever or other signs of infection. Patient does not produce urine so UTI was ruled out. CXR appears most consistent with atelectasis.  Blood cultures x 2 have NGTD and should be final on 7/11. We had a low index of suspicion for infection but held off on antibiotics. His last WBC count was 14.   #Anemia: Patient's hemoglobin level slowly tapered down to a nadir of 6.9 on 7/6. This coincided with a decreased level of engagement on exam and increased somnolence. He received 1 U PRBC on  7/6 and his hemoglobin went up to 9.5 His level of engagement greatly increased afterwards as he was now able to follow commands on examination and participate in PT/OT. He would now point at things in the room and grunt. His iron level was on the low end of normal and TIBC is low but ferritin normal which can be due to anemia of chronic disease related to his kidneys as well as his femoral artery bleed. He had a fecal occult blood test that was ordered but not collected prior to discharge.  #History of seizures: Mr. Rigley did not have any observed seizures during his time on the medicine team. A phenytoin level was checked (5) and, per pharmacy, appropriate after correcting for alumin (~11). He continued to receive phenytoin and clonazepam while here.  #Acquired adrenal insufficiency: This was felt to likely contribute to his hypotension. The patient's steroid wean was discussed above.  #Mental retardation: The patient appears at baseline from chart review and per family. Non-verbal with varying levels of engagement during his stay. He showed clear improvement after receiving blood transfusion in terms of engagement and cooperation.   Discharge Vitals:   BP 105/62  Pulse 102  Temp(Src) 98.3 F (36.8 C) (Oral)  Resp 15  Ht 5' (1.524 m)  Wt 132 lb 4.4 oz (60 kg)  BMI 25.83 kg/m2  SpO2 96%  Discharge Labs:  Results for orders placed during the hospital encounter of 04/10/14 (from the past 24 hour(s))  GLUCOSE, CAPILLARY     Status: Abnormal   Collection Time    04/26/14  5:29 PM      Result Value Ref Range   Glucose-Capillary 148 (*) 70 - 99  mg/dL  GLUCOSE, CAPILLARY     Status: Abnormal  Collection Time    04/26/14  8:38 PM      Result Value Ref Range   Glucose-Capillary 159 (*) 70 - 99 mg/dL  GLUCOSE, CAPILLARY     Status: Abnormal   Collection Time    04/27/14 12:19 AM      Result Value Ref Range   Glucose-Capillary 125 (*) 70 - 99 mg/dL  GLUCOSE, CAPILLARY     Status: Abnormal   Collection Time    04/27/14  4:36 AM      Result Value Ref Range   Glucose-Capillary 65 (*) 70 - 99 mg/dL  GLUCOSE, CAPILLARY     Status: Abnormal   Collection Time    04/27/14  5:22 AM      Result Value Ref Range   Glucose-Capillary 101 (*) 70 - 99 mg/dL  GLUCOSE, CAPILLARY     Status: Abnormal   Collection Time    04/27/14  7:47 AM      Result Value Ref Range   Glucose-Capillary 68 (*) 70 - 99 mg/dL  GLUCOSE, CAPILLARY     Status: None   Collection Time    04/27/14  8:28 AM      Result Value Ref Range   Glucose-Capillary 75  70 - 99 mg/dL   CBC    Component Value Date/Time   WBC 13.6* 04/26/2014 0518   RBC 2.91* 04/26/2014 0518   RBC 2.30* 04/17/2014 0800   HGB 8.5* 04/26/2014 0518   HCT 26.6* 04/26/2014 0518   PLT 147* 04/26/2014 0518   MCV 91.4 04/26/2014 0518   MCH 29.2 04/26/2014 0518   MCHC 32.0 04/26/2014 0518   RDW NOT CALCULATED 04/26/2014 0518   LYMPHSABS 1.0 04/25/2014 0515   MONOABS 0.5 04/25/2014 0515   EOSABS 0.0 04/25/2014 0515   BASOSABS 0.0 04/25/2014 0515    BMET    Component Value Date/Time   NA 140 04/26/2014 0518   K 4.5 04/26/2014 0518   CL 99 04/26/2014 0518   CO2 27 04/26/2014 0518   GLUCOSE 94 04/26/2014 0518   BUN 22 04/26/2014 0518   CREATININE 5.37* 04/26/2014 0518   CALCIUM 8.3* 04/26/2014 0518   GFRNONAA 10* 04/26/2014 0518   GFRAA 12* 04/26/2014 0518   7/7 phenytoin 5 (11 after correction per pharmacy)  Iron/TIBC/Ferritin/ %Sat    Component Value Date/Time   IRON 16* 04/25/2014 0515   TIBC 189* 04/25/2014 0515   FERRITIN 176 04/25/2014 0515   IRONPCTSAT 8* 04/25/2014 0515   6/29 vitamin b12 861 6/29 folate  17.9  Discharge Physical Exam:  General Appearance: Non- responsive, appears comfortable laying in bed  HEENT: Normocephalic, without obvious abnormality, atraumatic, maintained eye contact. PERRL. Moist mucous membranes with fleshy growths from his lower gums  Lungs: Difficult to appreciate lung sounds as patient did not inspire with effort and did not lean forward, respirations unlabored  Heart: Regular rate and rhythm, S1 and S2 normal, 2-3/6 blowing systolic ejection murmur best heard at the apex unchanged  Extremities: L groin with triple lumen catheter pulled, scar tissue palpable. R groin HD cath in place.  Pulses: 2+ and symmetric radial, difficult to appreciate in feet which were warm   Signed: Lorenda Hatchet, MD 04/27/2014, 2:12 PM    Services Ordered on Discharge: please continue PT/OT Equipment Ordered on Discharge: none

## 2014-04-27 LAB — GLUCOSE, CAPILLARY
GLUCOSE-CAPILLARY: 166 mg/dL — AB (ref 70–99)
Glucose-Capillary: 101 mg/dL — ABNORMAL HIGH (ref 70–99)
Glucose-Capillary: 125 mg/dL — ABNORMAL HIGH (ref 70–99)
Glucose-Capillary: 65 mg/dL — ABNORMAL LOW (ref 70–99)
Glucose-Capillary: 68 mg/dL — ABNORMAL LOW (ref 70–99)
Glucose-Capillary: 75 mg/dL (ref 70–99)

## 2014-04-27 MED ORDER — DOXERCALCIFEROL 4 MCG/2ML IV SOLN
INTRAVENOUS | Status: AC
  Filled 2014-04-27: qty 6

## 2014-04-27 NOTE — Progress Notes (Signed)
Inpatient Diabetes Program Recommendations  AACE/ADA: New Consensus Statement on Inpatient Glycemic Control (2013)  Target Ranges:  Prepandial:   less than 140 mg/dL      Peak postprandial:   less than 180 mg/dL (1-2 hours)      Critically ill patients:  140 - 180 mg/dL   Results for [name], Witt W (MRN [id number]) as of 04/27/2014 10:01  Ref. Range 04/27/2014 00:19 04/27/2014 04:36 04/27/2014 05:22 04/27/2014 07:47 04/27/2014 08:28  Glucose-Capillary Latest Range: 70-99 mg/dL 409125 (H) 65 (L) 811101 (H) 68 (L) 75  Diabetes history: none Outpatient Diabetes medications: none Current orders for Inpatient glycemic control: Novolog moderate correction 0-15 units    Recommend: Decrease SSI to Novolog sensitive correction 0-9units Q4H.  Susette RacerJulie Jelisa , RN, BA, MHA, CDE Diabetes Coordinator Inpatient Diabetes Program  [phone] (Team Pager) [phone] [name]( Office) 04/27/2014 10:05 AM

## 2014-04-27 NOTE — Progress Notes (Signed)
Subjective: [name] had no acute events overnight. Dr. Algis Liming and I met with his sister [name] yesterday late afternoon to discuss discharge planning. This morning he was arousable but very tired and not very interactive. He appeared comfortable in bed. He then was seen receiving dialysis ~11 am  Vital signs in last 24 hours: Filed Vitals:   04/27/14 1130 04/27/14 1158 04/27/14 1208 04/27/14 1230  BP: 112/62 83/54 107/59 106/58  Pulse: 94 95 98 106  Temp:      TempSrc:      Resp:      Height:      Weight:      SpO2:       Weight change:   Intake/Output Summary (Last 24 hours) at 04/27/14 1304 Last data filed at 04/27/14 0900  Gross per 24 hour  Intake   1660 ml  Output      2 ml  Net   1658 ml   BP 106/58  Pulse 106  Temp(Src) 98.3 F (36.8 C) (Oral)  Resp 15  Ht 5' (1.524 m)  Wt 132 lb 4.4 oz (60 kg)  BMI 25.83 kg/m2  SpO2 96%  General Appearance:    Non- responsive, appears comfortable laying in bed  HEENT:    Normocephalic, without obvious abnormality, atraumatic, maintained eye contact. PERRL. Moist mucous membranes with fleshy growths from his lower gums  Lungs:     Difficult to appreciate lung sounds as patient did not inspire with effort and did not lean forward, respirations unlabored   Heart:    Regular rate and rhythm, S1 and S2 normal, 3-5/0 blowing systolic ejection murmur best heard at the apex unchanged  Extremities:   L groin with triple lumen catheter pulled, scar tissue palpable. R groin HD cath in place.  Pulses:   2+ and symmetric radial, difficult to appreciate in feet which were warm   Lab Results: Basic Metabolic Panel:  Recent Labs  04/25/14 0515 04/26/14 0518  NA 139 140  K 5.8* 4.5  CL 98 99  CO2 26 27  GLUCOSE 97 94  BUN 40* 22  CREATININE 7.99* 5.37*  CALCIUM 8.3* 8.3*  PHOS 3.1  --    Liver Function Tests:  Recent Labs  04/25/14 0515  ALBUMIN 3.3*   No results found for this basename: LIPASE, AMYLASE,  in the last 72  hours No results found for this basename: AMMONIA,  in the last 72 hours  CBC:  Recent Labs  04/25/14 0515 04/26/14 0518  WBC 16.8* 13.6*  NEUTROABS 15.3*  --   HGB 9.4* 8.5*  HCT 29.4* 26.6*  MCV 89.9 91.4  PLT 222 147*   Cardiac Enzymes: No results found for this basename: CKTOTAL, CKMB, CKMBINDEX, TROPONINI,  in the last 72 hours BNP: No results found for this basename: PROBNP,  in the last 72 hours D-Dimer: No results found for this basename: DDIMER,  in the last 72 hours CBG:  Recent Labs  04/26/14 2038 04/27/14 0019 04/27/14 0436 04/27/14 0522 04/27/14 0747 04/27/14 0828  GLUCAP 159* 125* 65* 101* 68* 75   Hemoglobin A1C: No results found for this basename: HGBA1C,  in the last 72 hours Fasting Lipid Panel: No results found for this basename: CHOL, HDL, LDLCALC, TRIG, CHOLHDL, LDLDIRECT,  in the last 72 hours Thyroid Function Tests: No results found for this basename: TSH, T4TOTAL, FREET4, T3FREE, THYROIDAB,  in the last 72 hours Anemia Panel:  Recent Labs  04/25/14 0515  FERRITIN 176  TIBC  189*  IRON 16*   Coagulation: No results found for this basename: LABPROT, INR,  in the last 72 hours Urine Drug Screen: Drugs of Abuse  No results found for this basename: labopia,  cocainscrnur,  labbenz,  amphetmu,  thcu,  labbarb    Alcohol Level: No results found for this basename: ETH,  in the last 72 hours Urinalysis: No results found for this basename: COLORURINE, APPERANCEUR, LABSPEC, [place], GLUCOSEU, HGBUR, BILIRUBINUR, KETONESUR, PROTEINUR, UROBILINOGEN, NITRITE, LEUKOCYTESUR,  in the last 72 hours Misc. Labs: Phenytoin 5  Micro Results: Recent Results (from the past 240 hour(s))  CULTURE, BLOOD (ROUTINE X 2)     Status: None   Collection Time    04/24/14  6:30 PM      Result Value Ref Range Status   Specimen Description BLOOD LEFT CENTRAL LINE   Final   Special Requests BOTTLES DRAWN AEROBIC AND ANAEROBIC 10CC   Final   Culture  Setup Time      Final   Value: 04/25/2014 00:22     Performed at [facility]   Culture     Final   Value:        BLOOD CULTURE RECEIVED NO GROWTH TO DATE CULTURE WILL BE HELD FOR 5 DAYS BEFORE ISSUING A FINAL NEGATIVE REPORT     Performed at [facility]   Report Status PENDING   Incomplete   Studies/Results: No results found. Medications: I have reviewed the patient's current medications. Scheduled Meds: . atorvastatin  20 mg Oral QHS  . cinacalcet  30 mg Oral BID WC  . clonazePAM  0.25 mg Oral BID  . darbepoetin (ARANESP) injection - DIALYSIS  150 mcg Intravenous Q Tue-HD  . doxercalciferol  11 mcg Intravenous Q T,Th,Sa-HD  . feeding supplement (NEPRO CARB STEADY)  237 mL Oral BID BM  . insulin aspart  0-15 Units Subcutaneous 6 times per day  . metoCLOPramide (REGLAN) injection  10 mg Intravenous 3 times per day  . midodrine  10 mg Oral TID WC  . multivitamin  1 tablet Oral QHS  . pantoprazole  40 mg Oral Q1200  . phenytoin  200 mg Oral QHS  . predniSONE  10 mg Oral Q breakfast  . senna  1 tablet Oral BID  . sodium chloride  10-40 mL Intracatheter Q12H   Continuous Infusions: . sodium chloride 10 mL/hr at 04/24/14 0828  . vasopressin (PITRESSIN) infusion - *FOR SHOCK* Stopped (04/17/14 1200)   PRN Meds:.acetaminophen, acetaminophen, bisacodyl, diphenhydrAMINE, fentaNYL, ondansetron, phenol, sodium chloride Assessment/Plan: Active Problems:   Clotted renal dialysis arteriovenous graft   Palliative care encounter  1. ESRD on HD: Patient is tolerating HD via R groin catheter well and has a session today. His main issue is access as his only access is a triple lumen catheter in the L groin and a temporary HD catheter in the R groin. In order to receive outpatient HD, he needs to ambulate and sit upright. Patient needs to be evaluated and work with PT/OT to ensure he can sit upright to tolerate outpatient HD. PT/OT evaluated him yesterday and said that he can sit upright for HD  but needs assistance transferring as he is unsteady on feet. His sister believes he is deconditioned from hospital stay. The decision was made for him to receive HD today so that he will have a day to become readjusted before going to outpatient HD.  -Appreciate renal recs  -TTS HD -PT/OT  2. Hypotension: BP has been 90s-120s/50s-70s  this morning which is improved for him. He was a little tachycardic to the 100s.  Likely due to acquired adrenal insufficiency v acute shock. -Goal SBP > 75, consider pressors if hypotensive below baseline -Continue midodrine 10 mg TID -transition to prednisone 10 mg po daily equivalent yesterday and will do 10 day taper off prednisone as outpatient  3. Leukocytosis: Patient continues to have no signs of infection seems unlikely. In addition, he appears to be improving clinically since his transfusion which makes infection less likely. Patient does not produce urine so UTI ruled out. CXR appears most consistent with atelectasis. Will continue to have low index of suspicion for infection but currently holding off on antibiotics. He is on steroids which we are weaning. -cont to monitor and put on vancomycin/zosyn if he spikes a fever or other sign of infection -follow-up BCx x 2 (NGTD)  4. Anemia: Patient's level of engagement suggests that he is tolerating the anemia okay. Iron level low end of normal and TIBC is low but ferritin normal which can be due to anemia of chronic disease related to his kidneys. -follow-up fecal occult blood test standing -continue to monitor  5. History of seizures: Phenytoin level is appropriate after correcting for alumin (~11).  -appreciate pharmacy -cont phenytoin, clonazepam   6. Acquired adrenal insufficiency: Likely contributing to hypotension.  BP has improved slightly since hydrocortisone wean began -transition to prednisone 10 mg po daily equivalent yesterday and will do 10 day taper off prednisone as outpatient  7. Mental  retardation: Patient appears at baseline from chart review and per family. Non-verbal and a little less engaging today. He maintained eye contact and pointed at the window and windowsill. He was the most cooperative during exam he has been with me.  Dispo: Disposition is deferred at this time, awaiting improvement of current medical problems.  Anticipated discharge in approximately 1-2 day(s).   The patient does have a current PCP Corky Sox, MD) and does need an [facility] hospital follow-up appointment after discharge.  The patient does not know have transportation limitations that hinder transportation to clinic appointments.  .Services Needed at time of discharge: Y = Yes, Blank = No PT: Y  OT: Y  RN:   Equipment:   Other:     LOS: 17 days   Kelby Aline, MD 04/27/2014, 1:04 PM

## 2014-04-27 NOTE — Clinical Social Work Note (Addendum)
2:33pm- CSW spoke with RN regarding dc.  Pt projected to dc after HD.  DC packet on chart.  RN agreeable to call the below # for transportation.  [facility].  Pt will need HD prior to dc.  Plan is to dc after HD.  Transportation will be provided by the group home- [name] [phone]. Once dc summary is complete, packet to be on chart for dc.  CSW spoke with [name] (group home Director) who is aware and agreeable to dc plans.  [name], LCSWA [phone]  Clinical Social Work

## 2014-04-27 NOTE — Progress Notes (Signed)
Physical Therapy Treatment Patient Details Name: [name] MRN: [id number] DOB: Mar 15, 1951 Today's Date: 04/27/2014    History of Present Illness 63 yo baseline non verbal with ESRD on HD admitted for revision of thrombosed R AV graft. The procedure was complicated by injury to left superficial femoral artery requiring patch and significant blood loss. Pt with multiple problems with HD access.    PT Comments    Pt. Continues to be hesitant to fully participate in PT but he was willing to sit at edge of bed for several exercises.  When presented with his breakfast tray, he pushed it away.  Nursing tech about to go in to attempt to feed pt.   Follow Up Recommendations  Home health PT;Supervision/Assistance - 24 hour     Equipment Recommendations  Other (comment) (TBD)    Recommendations for Other Services       Precautions / Restrictions Precautions Precautions: Fall Precaution Comments: Very impulsive, Significant fall risk Restrictions Weight Bearing Restrictions: No    Mobility  Bed Mobility Overal bed mobility: Needs Assistance Bed Mobility: Supine to Sit;Sit to Supine     Supine to sit: Min guard Sit to supine: Supervision   General bed mobility comments: Pt. needing cueing for task completion but handling mobility in large part without physical assist  Transfers Overall transfer level:  (not attempted; pt. indicated he did not want to)                  Ambulation/Gait                 Stairs            Wheelchair Mobility    Modified Rankin (Stroke Patients Only)       Balance   Sitting-balance support: No upper extremity supported;Feet supported Sitting balance-Leahy Scale: Good Sitting balance - Comments: sat edge of bed and completed LE exercises                            Cognition Arousal/Alertness: Awake/alert Behavior During Therapy: WFL for tasks assessed/performed;Impulsive Overall Cognitive Status: History  of cognitive impairments - at baseline                      Exercises General Exercises - Lower Extremity Ankle Circles/Pumps: AAROM;Both;10 reps;Seated Long Arc Quad: AROM;Both;10 reps;Seated    General Comments        Pertinent Vitals/Pain See vitals tab No distress    Home Living                      Prior Function            PT Goals (current goals can now be found in the care plan section) Acute Rehab PT Goals Patient Stated Goal: Pt mute Progress towards PT goals: Progressing toward goals (though limited participation)    Frequency  Min 3X/week    PT Plan Current plan remains appropriate    Co-evaluation             End of Session   Activity Tolerance: Patient tolerated treatment well Patient left: in bed;with call bell/phone within reach;with bed alarm set     Time: [id number] PT Time Calculation (min): 16 min  Charges:  $Therapeutic Exercise: 8-22 mins                    G Codes:  Ferman Hamming 04/27/2014, 9:44 AM Weldon Picking PT Acute Rehab Services [phone] Beeper [phone]

## 2014-04-27 NOTE — Progress Notes (Signed)
[facility] Progress Note  Subjective:    Gestures for me to raise his bed rails. For discharge after HD  Objective Filed Vitals:   04/26/14 0455 04/26/14 1203 04/26/14 2051 04/27/14 0443  BP: 103/53 100/64 120/71 113/71  Pulse: 89 101 107 84  Temp: 97.5 F (36.4 C) 97.6 F (36.4 C) 99.4 F (37.4 C) 97.9 F (36.6 C)  TempSrc: Oral Oral Oral Oral  Resp: 20 18 17 18   Height:      Weight:      SpO2: 97% 100% 91% 94%   Physical Exam General: Alert, NAD, Mute Heart: RRR Lungs: CTA bilat, poor effort Abdomen: Soft, NT, +BS Extremities: No LE edema Dialysis Access: Rt fem TDC  Dialysis Orders: T[facility] @ [facility] 4mn 56 kg 2K/2Ca 400/A1.5 P4 Heparin tight hep due to fem art injury (3500 U at center) New R thigh TDC Hectorol 11 mcg No Aranesp or Veno - when last on Epo was on 5000 - no Fe studies since May   Assessment/Plan: 1. Shock - Resolved. Of pressors. Would not restart, Dr. SJonnie Finnerd/w primary MD on 7/3 2. Anemia due to CKD / ABLA - In setting of fem artery injury/bleed. Hgb 8.5 s/p PRBCs on 7/6. Tsat 35% on 7/5. Aranesp 150 qTues. Follow CBC.  3. Limited HD access - Failed L thigh AVG- unsuccessful attempt to salvage AVG on 6/22. R thigh TDC placed on 6/23 - purported last option. 4. ESRD - TTS , K+ 4.5. HD today  5. Chronic hypotension/Vol - SBPs 110s on midodrine. Wgts variable here. UF as tolerated 6. HPTH - Ca 8.3. Low phos resolving. Resume binders at d/c. Continue Hectorol 7. Nutrition - Alb 3.3, renal diet; multivitamin, supplements. High risk for inadequate intake 8. Adrenal insufficiency - on IV steroids- titrating down 9. Mental disability - pt is at his baseline, which is normally mute and will communicate with hand gestures sometimes 10. EOL Issues - PC team has met with family. Patient is now a DNR. Family wishes to continue with HD and to treat any reversible issues but wish to avoid invasive procedures/ ICU care/ prolonged suffering. For HD then  discharge home.  KCollene Leyden WCletus Gash PA-C [facility] Pager [phone] 37957/06/2014,9:45 AM  LOS: 17 days   Renal Attending: Stable for HD today and possible discharge. Cont current HD schedule. Jamarr Treinen C   Additional Objective Labs: Basic Metabolic Panel:  Recent Labs Lab 04/20/14 1722 04/21/14 0410  04/24/14 0452 04/25/14 0515 04/26/14 0518  NA 138 140  < > 137 139 140  K 3.8 3.9  < > 5.5* 5.8* 4.5  CL 95* 98  < > 98 98 99  CO2 28 29  < > 29 26 27   GLUCOSE 129* 126*  < > 100* 97 94  BUN 24* 9  < > 27* 40* 22  CREATININE 6.21* 3.39*  < > 5.83* 7.99* 5.37*  CALCIUM 7.9* 8.1*  < > 8.1* 8.3* 8.3*  PHOS 3.4 2.0*  --   --  3.1  --   < > = values in this interval not displayed. Liver Function Tests:  Recent Labs Lab 04/20/14 1722 04/25/14 0515  ALBUMIN 3.0* 3.3*   No results found for this basename: LIPASE, AMYLASE,  in the last 168 hours CBC:  Recent Labs Lab 04/23/14 0558 04/24/14 0452 04/24/14 1830 04/25/14 0515 04/26/14 0518  WBC 16.2* 14.1* 16.3* 16.8* 13.6*  NEUTROABS  --   --   --  15.3*  --  HGB 7.8* 6.9* 9.5* 9.4* 8.5*  HCT 24.6* 21.8* 29.7* 29.4* 26.6*  MCV 101.7* 102.3* 90.0 89.9 91.4  PLT 245 210 214 222 147*   Blood Culture    Component Value Date/Time   SDES BLOOD LEFT CENTRAL LINE 04/24/2014 1830   SPECREQUEST BOTTLES DRAWN AEROBIC AND ANAEROBIC 10CC 04/24/2014 1830   CULT  Value:        BLOOD CULTURE RECEIVED NO GROWTH TO DATE CULTURE WILL BE HELD FOR 5 DAYS BEFORE ISSUING A FINAL NEGATIVE REPORT Performed at [facility] 04/24/2014 1830   REPTSTATUS PENDING 04/24/2014 1830    CBG:  Recent Labs Lab 04/27/14 0019 04/27/14 0436 04/27/14 0522 04/27/14 0747 04/27/14 0828  GLUCAP 125* 65* 101* 68* 75   Iron Studies:  Recent Labs  04/25/14 0515  IRON 16*  TIBC 189*  FERRITIN 176   Studies/Results: No results found. Medications: . sodium chloride 10 mL/hr at 04/24/14 0828  . vasopressin (PITRESSIN) infusion -  *FOR SHOCK* Stopped (04/17/14 1200)   . atorvastatin  20 mg Oral QHS  . cinacalcet  30 mg Oral BID WC  . clonazePAM  0.25 mg Oral BID  . darbepoetin (ARANESP) injection - DIALYSIS  150 mcg Intravenous Q Tue-HD  . doxercalciferol  11 mcg Intravenous Q T,Th,Sa-HD  . feeding supplement (NEPRO CARB STEADY)  237 mL Oral BID BM  . insulin aspart  0-15 Units Subcutaneous 6 times per day  . metoCLOPramide (REGLAN) injection  10 mg Intravenous 3 times per day  . midodrine  10 mg Oral TID WC  . multivitamin  1 tablet Oral QHS  . pantoprazole  40 mg Oral Q1200  . phenytoin  200 mg Oral QHS  . predniSONE  10 mg Oral Q breakfast  . senna  1 tablet Oral BID  . sodium chloride  10-40 mL Intracatheter Q12H

## 2014-04-28 ENCOUNTER — Telehealth: Payer: Self-pay | Admitting: *Deleted

## 2014-04-28 NOTE — Discharge Summary (Signed)
Attending physician discharge note: I personally interviewed and examined this patient on the day of discharge together with resident physician Dr. Farley LyAdam Rothman. 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 He has end-stage renal disease on dialysis. 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 He underwent a thrombectomy and revision of a thrombosed left femoral arteriovenous graft on June 22. Of note, he had 3 previous thrombectomies of this graft: the first in December 2014, and again in March and April 25 of this year. Procedure complicated by femoral artery injury. A temporary dialysis catheter placed in the right femoral artery on June 23. He developed hypotension secondary to acute blood loss requiring temporary pressor support. Condition was stabilized and he was transferred to the medical service on 04/22/2014. Blood pressure remained stable for the duration of the hospital stay. Dialysis was continued on his regular schedule. Physical therapy and occupational therapy services enlisted and by discharge he was sufficiently mobile to be able to comply with outpatient dialysis. He will followup with nephrology for his kidney issues and with Gen. internal medicine in our [facility] with Dr. Lars MassonEden Jones.

## 2014-04-28 NOTE — Telephone Encounter (Signed)
Caregiver called 3:55PM lips were swollen this morning. Has occ ? reason. [facility] - no calls to caregiver. This AM - breathing ok and ate breakfast. Just discharge from hospital 04/27/14 - lips were not swollen. Suggest to take to ER when pt arrives home if lips are still swollen. Stanton KidneyDebra Seiji Wiswell RN 04/28/14 4PM

## 2014-04-29 ENCOUNTER — Ambulatory Visit (HOSPITAL_COMMUNITY)
Admission: EM | Admit: 2014-04-29 | Discharge: 2014-04-29 | Disposition: A | Discharge status: Home / Self Care | Payer: Medicare Other | Source: Home / Self Care | Attending: Emergency Medicine | Admitting: Emergency Medicine

## 2014-04-29 ENCOUNTER — Inpatient Hospital Stay: Admit: 2014-04-29 | Payer: Self-pay | Admitting: Vascular Surgery

## 2014-04-29 ENCOUNTER — Encounter (HOSPITAL_COMMUNITY): Payer: Medicare Other | Admitting: Anesthesiology

## 2014-04-29 ENCOUNTER — Emergency Department (HOSPITAL_COMMUNITY): Payer: Medicare Other

## 2014-04-29 ENCOUNTER — Emergency Department (HOSPITAL_COMMUNITY): Payer: Medicare Other | Admitting: Anesthesiology

## 2014-04-29 ENCOUNTER — Encounter (HOSPITAL_COMMUNITY): Payer: Self-pay | Admitting: Emergency Medicine

## 2014-04-29 ENCOUNTER — Encounter (HOSPITAL_COMMUNITY)
Admission: EM | Disposition: A | Discharge status: Home / Self Care | Payer: Self-pay | Source: Home / Self Care | Attending: Emergency Medicine

## 2014-04-29 DIAGNOSIS — E785 Hyperlipidemia, unspecified: Secondary | ICD-10-CM | POA: Insufficient documentation

## 2014-04-29 DIAGNOSIS — R569 Unspecified convulsions: Secondary | ICD-10-CM

## 2014-04-29 DIAGNOSIS — K219 Gastro-esophageal reflux disease without esophagitis: Secondary | ICD-10-CM

## 2014-04-29 DIAGNOSIS — N2581 Secondary hyperparathyroidism of renal origin: Secondary | ICD-10-CM | POA: Insufficient documentation

## 2014-04-29 DIAGNOSIS — T827XXA Infection and inflammatory reaction due to other cardiac and vascular devices, implants and grafts, initial encounter: Secondary | ICD-10-CM | POA: Diagnosis not present

## 2014-04-29 DIAGNOSIS — Z992 Dependence on renal dialysis: Secondary | ICD-10-CM

## 2014-04-29 DIAGNOSIS — Z8711 Personal history of peptic ulcer disease: Secondary | ICD-10-CM | POA: Insufficient documentation

## 2014-04-29 DIAGNOSIS — T82898A Other specified complication of vascular prosthetic devices, implants and grafts, initial encounter: Secondary | ICD-10-CM

## 2014-04-29 DIAGNOSIS — F79 Unspecified intellectual disabilities: Secondary | ICD-10-CM | POA: Insufficient documentation

## 2014-04-29 DIAGNOSIS — I739 Peripheral vascular disease, unspecified: Secondary | ICD-10-CM | POA: Insufficient documentation

## 2014-04-29 DIAGNOSIS — F411 Generalized anxiety disorder: Secondary | ICD-10-CM

## 2014-04-29 DIAGNOSIS — IMO0002 Reserved for concepts with insufficient information to code with codable children: Secondary | ICD-10-CM

## 2014-04-29 DIAGNOSIS — I120 Hypertensive chronic kidney disease with stage 5 chronic kidney disease or end stage renal disease: Secondary | ICD-10-CM

## 2014-04-29 DIAGNOSIS — R4182 Altered mental status, unspecified: Secondary | ICD-10-CM | POA: Diagnosis not present

## 2014-04-29 DIAGNOSIS — I509 Heart failure, unspecified: Secondary | ICD-10-CM

## 2014-04-29 DIAGNOSIS — Z4901 Encounter for fitting and adjustment of extracorporeal dialysis catheter: Secondary | ICD-10-CM

## 2014-04-29 DIAGNOSIS — R4701 Aphasia: Secondary | ICD-10-CM | POA: Insufficient documentation

## 2014-04-29 DIAGNOSIS — N186 End stage renal disease: Secondary | ICD-10-CM

## 2014-04-29 HISTORY — PX: EXCHANGE OF A DIALYSIS CATHETER: SHX5818

## 2014-04-29 LAB — CBC WITH DIFFERENTIAL/PLATELET
Basophils Absolute: 0 10*3/uL (ref 0.0–0.1)
Basophils Relative: 0 % (ref 0–1)
Eosinophils Absolute: 0.1 10*3/uL (ref 0.0–0.7)
Eosinophils Relative: 1 % (ref 0–5)
HEMATOCRIT: 30.2 % — AB (ref 39.0–52.0)
HEMOGLOBIN: 9.4 g/dL — AB (ref 13.0–17.0)
LYMPHS PCT: 19 % (ref 12–46)
Lymphs Abs: 1.4 10*3/uL (ref 0.7–4.0)
MCH: 29.4 pg (ref 26.0–34.0)
MCHC: 31.1 g/dL (ref 30.0–36.0)
MCV: 94.4 fL (ref 78.0–100.0)
MONO ABS: 0.6 10*3/uL (ref 0.1–1.0)
MONOS PCT: 9 % (ref 3–12)
NEUTROS ABS: 5 10*3/uL (ref 1.7–7.7)
Neutrophils Relative %: 71 % (ref 43–77)
Platelets: 132 10*3/uL — ABNORMAL LOW (ref 150–400)
RBC: 3.2 MIL/uL — ABNORMAL LOW (ref 4.22–5.81)
RDW: 29.6 % — ABNORMAL HIGH (ref 11.5–15.5)
WBC: 7.1 10*3/uL (ref 4.0–10.5)

## 2014-04-29 LAB — COMPREHENSIVE METABOLIC PANEL
ALT: 34 U/L (ref 0–53)
ANION GAP: 16 — AB (ref 5–15)
AST: 35 U/L (ref 0–37)
Albumin: 3.5 g/dL (ref 3.5–5.2)
Alkaline Phosphatase: 144 U/L — ABNORMAL HIGH (ref 39–117)
BILIRUBIN TOTAL: 0.3 mg/dL (ref 0.3–1.2)
BUN: 7 mg/dL (ref 6–23)
CO2: 27 meq/L (ref 19–32)
CREATININE: 3.16 mg/dL — AB (ref 0.50–1.35)
Calcium: 8.1 mg/dL — ABNORMAL LOW (ref 8.4–10.5)
Chloride: 99 mEq/L (ref 96–112)
GFR, EST AFRICAN AMERICAN: 22 mL/min — AB (ref >90)
GFR, EST NON AFRICAN AMERICAN: 19 mL/min — AB (ref >90)
Glucose, Bld: 90 mg/dL (ref 70–99)
Potassium: 3.7 mEq/L (ref 3.7–5.3)
Sodium: 142 mEq/L (ref 137–147)
Total Protein: 6.9 g/dL (ref 6.0–8.3)

## 2014-04-29 LAB — PROTIME-INR
INR: 1.08 (ref 0.00–1.49)
PROTHROMBIN TIME: 14 s (ref 11.6–15.2)

## 2014-04-29 LAB — TYPE AND SCREEN
ABO/RH(D): B POS
Antibody Screen: NEGATIVE

## 2014-04-29 LAB — GLUCOSE, CAPILLARY: GLUCOSE-CAPILLARY: 91 mg/dL (ref 70–99)

## 2014-04-29 LAB — PHENYTOIN LEVEL, TOTAL: Phenytoin Lvl: <2.5 ug/mL — ABNORMAL LOW (ref 10.0–20.0)

## 2014-04-29 SURGERY — EXCHANGE OF A DIALYSIS CATHETER
Anesthesia: Monitor Anesthesia Care | Site: Groin | Laterality: Right

## 2014-04-29 MED ORDER — FENTANYL CITRATE 0.05 MG/ML IJ SOLN
INTRAMUSCULAR | Status: AC
  Filled 2014-04-29: qty 5

## 2014-04-29 MED ORDER — HYDROCORTISONE NA SUCCINATE PF 100 MG IJ SOLR
100.0000 mg | Freq: Once | INTRAMUSCULAR | Status: AC
  Administered 2014-04-29: 100 mg via INTRAVENOUS
  Filled 2014-04-29: qty 2

## 2014-04-29 MED ORDER — SODIUM CHLORIDE 0.9 % IV SOLN: 250.0000 mg | Freq: Every day | INTRAVENOUS | Status: DC

## 2014-04-29 MED ORDER — SODIUM CHLORIDE 0.9 % IV SOLN
1000.0000 mg | Freq: Once | INTRAVENOUS | Status: DC
  Filled 2014-04-29: qty 20

## 2014-04-29 MED ORDER — HEPARIN SODIUM (PORCINE) 5000 UNIT/ML IJ SOLN
INTRAMUSCULAR | Status: DC | PRN
  Administered 2014-04-29: 22:00:00

## 2014-04-29 MED ORDER — PROPOFOL 10 MG/ML IV BOLUS
INTRAVENOUS | Status: DC | PRN
  Administered 2014-04-29 (×3): 20 mg via INTRAVENOUS

## 2014-04-29 MED ORDER — OXYCODONE HCL 5 MG/5ML PO SOLN: 5.0000 mg | Freq: Once | ORAL | Status: DC | PRN

## 2014-04-29 MED ORDER — HEPARIN SODIUM (PORCINE) 1000 UNIT/ML IJ SOLN
INTRAMUSCULAR | Status: AC
  Filled 2014-04-29: qty 1

## 2014-04-29 MED ORDER — LIDOCAINE HCL (PF) 1 % IJ SOLN
INTRAMUSCULAR | Status: AC
  Filled 2014-04-29: qty 30

## 2014-04-29 MED ORDER — DEXTROSE 5 % IV SOLN
INTRAVENOUS | Status: DC | PRN
  Administered 2014-04-29: 21:00:00 via INTRAVENOUS

## 2014-04-29 MED ORDER — FENTANYL CITRATE 0.05 MG/ML IJ SOLN
INTRAMUSCULAR | Status: DC | PRN
  Administered 2014-04-29 (×2): 50 ug via INTRAVENOUS

## 2014-04-29 MED ORDER — LIDOCAINE HCL (PF) 1 % IJ SOLN
INTRAMUSCULAR | Status: DC | PRN
  Administered 2014-04-29: 16 mL

## 2014-04-29 MED ORDER — SODIUM CHLORIDE 0.9 % IV SOLN: 300.0000 mg | Freq: Every day | INTRAVENOUS | Status: DC

## 2014-04-29 MED ORDER — PROPOFOL 10 MG/ML IV BOLUS
INTRAVENOUS | Status: AC
  Filled 2014-04-29: qty 20

## 2014-04-29 MED ORDER — SODIUM CHLORIDE 0.9 % IV SOLN
INTRAVENOUS | Status: DC | PRN
  Administered 2014-04-29: 21:00:00 via INTRAVENOUS

## 2014-04-29 MED ORDER — PROMETHAZINE HCL 25 MG/ML IJ SOLN: 6.2500 mg | INTRAMUSCULAR | Status: DC | PRN

## 2014-04-29 MED ORDER — MIDAZOLAM HCL 2 MG/2ML IJ SOLN
INTRAMUSCULAR | Status: AC
  Filled 2014-04-29: qty 2

## 2014-04-29 MED ORDER — CEFAZOLIN SODIUM-DEXTROSE 2-3 GM-% IV SOLR
INTRAVENOUS | Status: DC | PRN
  Administered 2014-04-29: 2 g via INTRAVENOUS

## 2014-04-29 MED ORDER — OXYCODONE HCL 5 MG PO TABS: 5.0000 mg | ORAL_TABLET | Freq: Once | ORAL | Status: DC | PRN

## 2014-04-29 MED ORDER — HYDROMORPHONE HCL PF 1 MG/ML IJ SOLN: 0.2500 mg | INTRAMUSCULAR | Status: DC | PRN

## 2014-04-29 MED ORDER — MIDAZOLAM HCL 5 MG/5ML IJ SOLN
INTRAMUSCULAR | Status: DC | PRN
  Administered 2014-04-29 (×2): 1 mg via INTRAVENOUS

## 2014-04-29 MED ORDER — HEPARIN SODIUM (PORCINE) 1000 UNIT/ML IJ SOLN
INTRAMUSCULAR | Status: DC | PRN
  Administered 2014-04-29: 5 mL via INTRA_ARTERIAL

## 2014-04-29 SURGICAL SUPPLY — 45 items
ADH SKN CLS APL DERMABOND .7 (GAUZE/BANDAGES/DRESSINGS) ×1
BAG DECANTER FOR FLEXI CONT (MISCELLANEOUS) ×3 IMPLANT
BLADE 10 SAFETY STRL DISP (BLADE) ×3 IMPLANT
CATH CANNON HEMO 15F 50CM (CATHETERS) ×2 IMPLANT
CATH CANNON HEMO 15FR 19 (HEMODIALYSIS SUPPLIES) IMPLANT
CATH CANNON HEMO 15FR 23CM (HEMODIALYSIS SUPPLIES) IMPLANT
CATH CANNON HEMO 15FR 31CM (HEMODIALYSIS SUPPLIES) IMPLANT
CATH CANNON HEMO 15FR 32 (HEMODIALYSIS SUPPLIES) IMPLANT
CATH CANNON HEMO 15FR 32CM (HEMODIALYSIS SUPPLIES) ×3 IMPLANT
COVER PROBE W GEL 5X96 (DRAPES) IMPLANT
COVER SURGICAL LIGHT HANDLE (MISCELLANEOUS) ×3 IMPLANT
DECANTER SPIKE VIAL GLASS SM (MISCELLANEOUS) ×3 IMPLANT
DERMABOND ADVANCED (GAUZE/BANDAGES/DRESSINGS) ×2
DERMABOND ADVANCED .7 DNX12 (GAUZE/BANDAGES/DRESSINGS) IMPLANT
DRAPE C-ARM 42X72 X-RAY (DRAPES) ×3 IMPLANT
DRAPE CHEST BREAST 15X10 FENES (DRAPES) ×3 IMPLANT
DRSG COVADERM 4X8 (GAUZE/BANDAGES/DRESSINGS) ×2 IMPLANT
GAUZE SPONGE 2X2 8PLY STRL LF (GAUZE/BANDAGES/DRESSINGS) ×1 IMPLANT
GAUZE SPONGE 4X4 16PLY XRAY LF (GAUZE/BANDAGES/DRESSINGS) ×3 IMPLANT
GLOVE SS BIOGEL STRL SZ 7 (GLOVE) ×1 IMPLANT
GLOVE SUPERSENSE BIOGEL SZ 7 (GLOVE) ×4
GOWN STRL REUS W/ TWL LRG LVL3 (GOWN DISPOSABLE) ×3 IMPLANT
GOWN STRL REUS W/TWL LRG LVL3 (GOWN DISPOSABLE) ×9
KIT BASIN OR (CUSTOM PROCEDURE TRAY) ×3 IMPLANT
KIT ROOM TURNOVER OR (KITS) ×3 IMPLANT
NDL 18GX1X1/2 (RX/OR ONLY) (NEEDLE) ×1 IMPLANT
NDL HYPO 25GX1X1/2 BEV (NEEDLE) ×1 IMPLANT
NEEDLE 18GX1X1/2 (RX/OR ONLY) (NEEDLE) ×3 IMPLANT
NEEDLE 22X1 1/2 (OR ONLY) (NEEDLE) ×3 IMPLANT
NEEDLE HYPO 25GX1X1/2 BEV (NEEDLE) ×3 IMPLANT
NS IRRIG 1000ML POUR BTL (IV SOLUTION) ×3 IMPLANT
PACK SURGICAL SETUP 50X90 (CUSTOM PROCEDURE TRAY) ×3 IMPLANT
PAD ARMBOARD 7.5X6 YLW CONV (MISCELLANEOUS) ×6 IMPLANT
SOAP 2 % CHG 4 OZ (WOUND CARE) ×3 IMPLANT
SPONGE GAUZE 2X2 STER 10/PKG (GAUZE/BANDAGES/DRESSINGS) ×2
SUT ETHILON 3 0 PS 1 (SUTURE) ×5 IMPLANT
SUT VICRYL 4-0 PS2 18IN ABS (SUTURE) ×3 IMPLANT
SYR 20CC LL (SYRINGE) ×3 IMPLANT
SYR 30ML LL (SYRINGE) IMPLANT
SYR 5ML LL (SYRINGE) ×6 IMPLANT
SYR CONTROL 10ML LL (SYRINGE) ×3 IMPLANT
SYRINGE 10CC LL (SYRINGE) ×3 IMPLANT
TOWEL OR 17X24 6PK STRL BLUE (TOWEL DISPOSABLE) ×3 IMPLANT
TOWEL OR 17X26 10 PK STRL BLUE (TOWEL DISPOSABLE) ×3 IMPLANT
WATER STERILE IRR 1000ML POUR (IV SOLUTION) ×3 IMPLANT

## 2014-04-29 NOTE — Progress Notes (Addendum)
MEDICATION RELATED CONSULT NOTE - INITIAL   Pharmacy Consult for phenytoin Indication: seizures  No Known Allergies  Patient Measurements:     Vital Signs: Temp: 97.5 F (36.4 C) (07/11 1709) Temp src: Oral (07/11 1709) BP: 96/61 mmHg (07/11 2000) Pulse Rate: 93 (07/11 2000) Intake/Output from previous day:   Intake/Output from this shift:    Labs:  Recent Labs  04/29/14 1850  WBC 7.1  HGB 9.4*  HCT 30.2*  PLT 132*  CREATININE 3.16*  ALBUMIN 3.5  PROT 6.9  AST 35  ALT 34  ALKPHOS 144*  BILITOT 0.3   The CrCl is unknown because both a height and weight (above a minimum accepted value) are required for this calculation.   Microbiology: Recent Results (from the past 720 hour(s))  MRSA PCR SCREENING     Status: None   Collection Time    04/11/14 12:56 AM      Result Value Ref Range Status   MRSA by PCR NEGATIVE  NEGATIVE Final   Comment:            The GeneXpert MRSA Assay (FDA     approved for NASAL specimens     only), is one component of a     comprehensive MRSA colonization     surveillance program. It is not     intended to diagnose MRSA     infection nor to guide or     monitor treatment for     MRSA infections.  CULTURE, BLOOD (ROUTINE X 2)     Status: None   Collection Time    04/24/14  6:30 PM      Result Value Ref Range Status   Specimen Description BLOOD LEFT CENTRAL LINE   Final   Special Requests BOTTLES DRAWN AEROBIC AND ANAEROBIC 10CC   Final   Culture  Setup Time     Final   Value: 04/25/2014 00:22     Performed at [facility]   Culture     Final   Value:        BLOOD CULTURE RECEIVED NO GROWTH TO DATE CULTURE WILL BE HELD FOR 5 DAYS BEFORE ISSUING A FINAL NEGATIVE REPORT     Performed at [facility]   Report Status PENDING   Incomplete    Medical History: Past Medical History  Diagnosis Date  . Heart murmur, systolic 08/10/2009  . Syncope 05/07/2009  . 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 Esophageal  varices 11/07/2008  . Gastric ulcer 10/09    antral, with h pylori positive  . Congestive heart failure 03/06/2008  . Cellulitis and abscess of leg, except foot 03/06/2008  . Secondary hyperparathyroidism 02/02/2008  . Mute 02/02/2008  . Hyperlipidemia 02/02/2008  . Anemia 02/02/2008  . ESRD (end stage renal disease)     TTS hemodialysis  . GERD (gastroesophageal reflux disease)   . Hypertension 02/02/2008    in history  . Mental retardation 02/02/2008  . Mute   . Seizures     "non in a while at home". none in past year .  Marland Kitchen Complication of anesthesia     in the past BP has dropped     Assessment: 63 YOM admitted for evaluation of his right femoral hemodialysis catheter. He is on phenytoin PTA at dose of 200mg  qhs, however patient's phenytoin level this evening is undetectable and does not correct to a normal range with albumin of 3.5 and taking renal function into account. Difficult to ascertain the  exact level of phenytoin patient has on board- assume it is negligible.   Goal of Therapy:  Total phenytoin level 10-6320mcg/mL  Plan:  1. Phenytoin 1000mg  IV x1 as loading dose- this is ~17.5mg /kg which is right in the middle of a 15-20mg /kg loading dose 2. Phenytoin 250mg  IV q24h (~5mg /kg/day) starting tomorrow evening as maintenance dose 3. phenytoin trough tomorrow evening to ensure loaded properly. Then will check a level in 5-7 days for evaluation at steady state  Gearld Kerstein D. Zaida Reiland, PharmD, BCPS Clinical Pharmacist Pager: [phone] 04/29/2014 9:27 PM

## 2014-04-29 NOTE — Anesthesia Procedure Notes (Signed)
Procedure Name: MAC Date/Time: 04/29/2014 9:09 PM Performed by: Wray KearnsFOLEY, Letesha Klecker A Pre-anesthesia Checklist: Patient identified, Timeout performed, Emergency Drugs available, Suction available and Patient being monitored Patient Re-evaluated:Patient Re-evaluated prior to inductionOxygen Delivery Method: Nasal cannula Intubation Type: IV induction

## 2014-04-29 NOTE — ED Notes (Signed)
Received pt from dialysis with c/o after completing dialysis pt pulled out his temporary dialysis access. Per EMS pt is non verbal and can become agitated.

## 2014-04-29 NOTE — Consult Note (Signed)
Vascular Surgery Consultation  Reason for Consult: The patient pulled out right femoral dialysis catheter slightly  HPI: [name] is a 63 y.o. male who presents for evaluation of right femoral dialysis catheter. Patient had right femoral dialysis catheter inserted by Dr. Myra Gianotti on June 23. Today patient went to hemodialysis and the catheter had been pulled out slightly and the cuff was exposed and he was sent to the emergency department for evaluation   Past Medical History  Diagnosis Date  . Heart murmur, systolic 08/10/2009  . Syncope 05/07/2009  . 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 Esophageal varices 11/07/2008  . Gastric ulcer 10/09    antral, with h pylori positive  . Congestive heart failure 03/06/2008  . Cellulitis and abscess of leg, except foot 03/06/2008  . Secondary hyperparathyroidism 02/02/2008  . Mute 02/02/2008  . Hyperlipidemia 02/02/2008  . Anemia 02/02/2008  . ESRD (end stage renal disease)     TTS hemodialysis  . GERD (gastroesophageal reflux disease)   . Hypertension 02/02/2008    in history  . Mental retardation 02/02/2008  . Mute   . Seizures     "non in a while at home". none in past year .  Marland Kitchen Complication of anesthesia     in the past BP has dropped    Past Surgical History  Procedure Laterality Date  . Left forearm graft      for HD  . Arteriovenous graft placement  11/22/10    Right thigh AVG  . Thrombectomy and revision of arterioventous (av) goretex  graft    . Thrombectomy and revision of arterioventous (av) goretex  graft  10/22/2012    Procedure: THROMBECTOMY AND REVISION OF ARTERIOVENTOUS (AV) GORETEX  GRAFT;  Surgeon: Larina Earthly, MD;  Location: [facility] OR;  Service: Vascular;  Laterality: Right;  . Thrombectomy w/ embolectomy  11/10/2012    Procedure: THROMBECTOMY ARTERIOVENOUS GORE-TEX GRAFT;  Surgeon: Pryor Ochoa, MD;  Location: [facility] OR;  Service: Vascular;  Laterality: Right;  . Thrombectomy and revision of arterioventous (av)  goretex  graft Right 12/08/2012    Procedure: THROMBECTOMY AND REVISION OF ARTERIOVENTOUS (AV) GORETEX  GRAFT right thigh;  Surgeon: Sherren Kerns, MD;  Location: [facility] OR;  Service: Vascular;  Laterality: Right;  Susie Cassette N/A 12/08/2012    Procedure: VENOGRAM;  Surgeon: Sherren Kerns, MD;  Location: [facility] OR;  Service: Vascular;  Laterality: N/A;  Intraoperative Central venogram  . Thrombectomy w/ embolectomy Right 12/12/2012    Procedure: THROMBECTOMY ARTERIOVENOUS GORE-TEX GRAFT;  Surgeon: Nada Libman, MD;  Location[facility] OR;  Service: Vascular;  Laterality: Right;  . Insertion of dialysis catheter Left 12/14/2012    Procedure: INSERTION OF DIALYSIS CATHETER;  Surgeon: Nada Libman, MD;  Location: [facility] OR;  Service: Vascular;  Laterality: Left;  . Insertion of dialysis catheter Right 01/13/2013    Procedure: INSERTION OF DIALYSIS CATHETER;  Surgeon: Nada Libman, MD;  Location: [facility] OR;  Service: Vascular;  Laterality: Right;  . Removal of a dialysis catheter Left 01/13/2013    Procedure: REMOVAL OF A DIALYSIS CATHETER;  Surgeon: Nada Libman, MD;  Location: [facility] OR;  Service: Vascular;  Laterality: Left;  . Av fistula placement Left 02/11/2013    Procedure: INSERTION OF ARTERIOVENOUS (AV) GORE-TEX GRAFT THIGH;  Surgeon: Nada Libman, MD;  Location: [facility] OR;  Service: Vascular;  Laterality: Left;  using 6mm x 50cm Gore-Tex Vascular Graft  . Esophagogastroduodenoscopy N/A 02/16/2013    Procedure: ESOPHAGOGASTRODUODENOSCOPY (  EGD);  Surgeon: Vertell Novak., MD;  Location: [facility] ENDOSCOPY;  Service: Endoscopy;  Laterality: N/A;  bedside  . Esophagogastroduodenoscopy N/A 09/14/2013    Procedure: ESOPHAGOGASTRODUODENOSCOPY (EGD);  Surgeon: Vertell Novak., MD;  Location: [facility] ENDOSCOPY;  Service: Endoscopy;  Laterality: N/A;  control of bleeding if needed  . Esophagogastroduodenoscopy N/A 10/03/2013    Procedure: ESOPHAGOGASTRODUODENOSCOPY (EGD);  Surgeon: Theda Belfast, MD;  Location: [facility]  ENDOSCOPY;  Service: Endoscopy;  Laterality: N/A;  Bedside  . Radiology with anesthesia Right 10/19/2013    Procedure: RADIOLOGY WITH ANESTHESIA;  Surgeon: Malachy Moan, MD;  Location: [facility] OR;  Service: Radiology;  Laterality: Right;  . Radiology with anesthesia Left 12/28/2013    Procedure: RADIOLOGY WITH ANESTHESIA;  Surgeon: Reola Calkins, MD;  Location: [facility] OR;  Service: Radiology;  Laterality: Left;  . Thrombectomy and revision of arterioventous (av) goretex  graft Left 01/25/2014    Procedure: THROMBECTOMY AND REVISION OF LEFT THIGH ARTERIOVENTOUS (AV) GORETEX  GRAFT WITH PATCH ANGIOPLASTY;  Surgeon: Pryor Ochoa, MD;  Location: [facility] OR;  Service: Vascular;  Laterality: Left;  . Thrombectomy and revision of arterioventous (av) goretex  graft Left 04/10/2014    Procedure: THROMBECTOMY AND REVISION OF ARTERIOVENTOUS (AV) GORETEX  GRAFT;  Surgeon: Sherren Kerns, MD;  Location: [facility] OR;  Service: Vascular;  Laterality: Left;  . Patch angioplasty Left 04/10/2014    Procedure: PATCH ANGIOPLASTY LEFT SFA;  Surgeon: Sherren Kerns, MD;  Location: [facility] OR;  Service: Vascular;  Laterality: Left;  . Insertion of dialysis catheter Bilateral 04/11/2014    Procedure: INSERTION OF DIALYSIS CATHETER RIGHT FEMORAL VEIN; INSERTION OF TRIPLE LUMEN LEFT FEMORAL VEIN CENTRAL LINE; REMOVAL OF DIALYSIS CATHETER IN RIGHT FEMORAL VEIN.;  Surgeon: Nada Libman, MD;  Location: [facility] OR;  Service: Vascular;  Laterality: Bilateral;   History   Social History  . Marital Status: Single    Spouse Name: N/A    Number of Children: 0  . Years of Education: N/A   Occupational History  .     Social History Main Topics  . Smoking status: Never Smoker   . Smokeless tobacco: Never Used  . Alcohol Use: No  . Drug Use: No  . Sexual Activity: No   Other Topics Concern  . None   Social History Narrative   Patient is living with care providers.    Patient is right handed.    Patient does not have any children.     Patient is on disability          No family history on file. No Known Allergies Prior to Admission medications   Medication Sig Start Date End Date Taking? Authorizing Provider  atorvastatin (LIPITOR) 20 MG tablet Take 20 mg by mouth at bedtime.     Historical Provider, MD  bisacodyl (DULCOLAX) 5 MG EC tablet Take 1 tablet (5 mg total) by mouth daily as needed for moderate constipation. 04/26/14   Lorenda Hatchet, MD  cinacalcet (SENSIPAR) 30 MG tablet Take 30 mg by mouth 2 (two) times daily with a meal.    Historical Provider, MD  clonazePAM (KLONOPIN) 0.5 MG tablet Take 0.25 mg by mouth 2 (two) times daily.    Historical Provider, MD  folic acid-vitamin b complex-vitamin c-selenium-zinc (DIALYVITE) 3 MG TABS tablet Take 1 tablet by mouth daily.    Historical Provider, MD  midodrine (PROAMATINE) 10 MG tablet Take 15 mg by mouth 3 (three) times daily.     Historical  Provider, MD  Nutritional Supplements (FEEDING SUPPLEMENT, NEPRO CARB STEADY,) LIQD Take 237 mLs by mouth 2 (two) times daily between meals. 04/26/14   Lorenda HatchetAdam L Rothman, MD  pantoprazole (PROTONIX) 40 MG tablet Take 40 mg by mouth daily.    Historical Provider, MD  phenytoin (DILANTIN) 100 MG ER capsule Take 200 mg by mouth at bedtime.    Historical Provider, MD  predniSONE (DELTASONE) 5 MG tablet Take 2 tablets (10 mg total) by mouth daily with breakfast. Take 10 mg for 3 days then 5 until finished 04/26/14   Lorenda HatchetAdam L Rothman, MD  sevelamer carbonate (RENVELA) 800 MG tablet Take 800 mg by mouth 3 (three) times daily with meals.    Historical Provider, MD     Positive ROS:   All other systems have been reviewed and were otherwise negative with the exception of those mentioned in the HPI and as above.  Physical Exam: Filed Vitals:   04/29/14 2000  BP: 96/61  Pulse: 93  Temp:   Resp: 17    General: Alert, no acute distress HEENT: Normal for age Cardiovascular: Regular rate and rhythm. Carotid pulses 2+, no bruits  audible Respiratory: Clear to auscultation. No cyanosis, no use of accessory musculature GI: No organomegaly, abdomen is soft and non-tender Skin: No lesions in the area of chief complaint Neurologic: Sensation intact distally Psychiatric: Patient is competent for consent with normal mood and affect Musculoskeletal: No obvious deformities Extremities: Right femoral dialysis catheter examined and the cuff was exposed. Tissue is not erythematous.  Labs reviewed: Potassium 3.7     Assessment/Plan:  Plan take patient to OR to exchange for new hemodialysis catheter.   Josephina GipJames Marieann Zipp, MD 04/29/2014 8:54 PM

## 2014-04-29 NOTE — ED Notes (Signed)
MD at bedside. 

## 2014-04-29 NOTE — Anesthesia Postprocedure Evaluation (Signed)
Anesthesia Post Note  Patient: [name]  Procedure(s) Performed: Procedure(s) (LRB): EXCHANGE OF A  FEMORAL DIALYSIS CATHETER (Right)  Anesthesia type: MAC  Patient location: PACU  Post pain: Pain level controlled  Post assessment: Patient's Cardiovascular Status Stable  Last Vitals:  Filed Vitals:   04/29/14 2237  BP: 113/71  Pulse: 95  Temp: 36.4 C  Resp: 14    Post vital signs: Reviewed and stable  Level of consciousness: sedated  Complications: No apparent anesthesia complications

## 2014-04-29 NOTE — Transfer of Care (Signed)
Immediate Anesthesia Transfer of Care Note  Patient: [name]  Procedure(s) Performed: Procedure(s): EXCHANGE OF A  FEMORAL DIALYSIS CATHETER (Right)  Patient Location: PACU  Anesthesia Type:MAC  Level of Consciousness: awake, patient cooperative and responds to stimulation  Airway & Oxygen Therapy: Patient Spontanous Breathing  Post-op Assessment: Report given to PACU RN, Post -op Vital signs reviewed and stable, Patient moving all extremities and Patient moving all extremities X 4  Post vital signs: Reviewed and stable  Complications: No apparent anesthesia complications

## 2014-04-29 NOTE — ED Provider Notes (Signed)
CSN: [id number]     Arrival date & time 04/29/14  1656 History   First MD Initiated Contact with Patient 04/29/14 1716     Chief Complaint  Patient presents with  . Vascular Access Problem     (Consider location/radiation/quality/duration/timing/severity/associated sxs/prior Treatment) HPI Comments: Patient from dialysis center via EMS. He is nonverbal at baseline. He reportedly pulled his. Dialysis access in his right groin after completing dialysis today. No reported bleeding. He is not on anticoagulation. He was discharged from the hospital 2 days ago after complications with his dialysis access.  The history is provided by the patient and the EMS personnel. The history is limited by the condition of the patient.    Past Medical History  Diagnosis Date  . Heart murmur, systolic 08/10/2009  . Syncope 05/07/2009  . 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 Esophageal varices 11/07/2008  . Gastric ulcer 10/09    antral, with h pylori positive  . Congestive heart failure 03/06/2008  . Cellulitis and abscess of leg, except foot 03/06/2008  . Secondary hyperparathyroidism 02/02/2008  . Mute 02/02/2008  . Hyperlipidemia 02/02/2008  . Anemia 02/02/2008  . ESRD (end stage renal disease)     TTS hemodialysis  . GERD (gastroesophageal reflux disease)   . Hypertension 02/02/2008    in history  . Mental retardation 02/02/2008  . Mute   . Seizures     "non in a while at home". none in past year .  Marland Kitchen Complication of anesthesia     in the past BP has dropped    Past Surgical History  Procedure Laterality Date  . Left forearm graft      for HD  . Arteriovenous graft placement  11/22/10    Right thigh AVG  . Thrombectomy and revision of arterioventous (av) goretex  graft    . Thrombectomy and revision of arterioventous (av) goretex  graft  10/22/2012    Procedure: THROMBECTOMY AND REVISION OF ARTERIOVENTOUS (AV) GORETEX  GRAFT;  Surgeon: Larina Earthly, MD;  Location: [facility] OR;  Service:  Vascular;  Laterality: Right;  . Thrombectomy w/ embolectomy  11/10/2012    Procedure: THROMBECTOMY ARTERIOVENOUS GORE-TEX GRAFT;  Surgeon: Pryor Ochoa, MD;  Location: [facility] OR;  Service: Vascular;  Laterality: Right;  . Thrombectomy and revision of arterioventous (av) goretex  graft Right 12/08/2012    Procedure: THROMBECTOMY AND REVISION OF ARTERIOVENTOUS (AV) GORETEX  GRAFT right thigh;  Surgeon: Sherren Kerns, MD;  Location: [name] OR;  Service: Vascular;  Laterality: Right;  Susie Cassette N/A 12/08/2012    Procedure: VENOGRAM;  Surgeon: Sherren Kerns, MD;  Location: [facility] OR;  Service: Vascular;  Laterality: N/A;  Intraoperative Central venogram  . Thrombectomy w/ embolectomy Right 12/12/2012    Procedure: THROMBECTOMY ARTERIOVENOUS GORE-TEX GRAFT;  Surgeon: Nada Libman, MD;  Location: [facility] OR;  Service: Vascular;  Laterality: Right;  . Insertion of dialysis catheter Left 12/14/2012    Procedure: INSERTION OF DIALYSIS CATHETER;  Surgeon: Nada Libman, MD;  Location: [facility] OR;  Service: Vascular;  Laterality: Left;  . Insertion of dialysis catheter Right 01/13/2013    Procedure: INSERTION OF DIALYSIS CATHETER;  Surgeon: Nada Libman, MD;  Location: [facility] OR;  Service: Vascular;  Laterality: Right;  . Removal of a dialysis catheter Left 01/13/2013    Procedure: REMOVAL OF A DIALYSIS CATHETER;  Surgeon: Nada Libman, MD;  Location: [facility] OR;  Service: Vascular;  Laterality: Left;  . Av fistula placement Left 02/11/2013  Procedure: INSERTION OF ARTERIOVENOUS (AV) GORE-TEX GRAFT THIGH;  Surgeon: Nada Libman, MD;  Location: [facility] OR;  Service: Vascular;  Laterality: Left;  using 6mm x 50cm Gore-Tex Vascular Graft  . Esophagogastroduodenoscopy N/A 02/16/2013    Procedure: ESOPHAGOGASTRODUODENOSCOPY (EGD);  Surgeon: Vertell Novak., MD;  Location: [facility] ENDOSCOPY;  Service: Endoscopy;  Laterality: N/A;  bedside  . Esophagogastroduodenoscopy N/A 09/14/2013    Procedure: ESOPHAGOGASTRODUODENOSCOPY (EGD);   Surgeon: Vertell Novak., MD;  Location: [facility] ENDOSCOPY;  Service: Endoscopy;  Laterality: N/A;  control of bleeding if needed  . Esophagogastroduodenoscopy N/A 10/03/2013    Procedure: ESOPHAGOGASTRODUODENOSCOPY (EGD);  Surgeon: Theda Belfast, MD;  Location: [facility] ENDOSCOPY;  Service: Endoscopy;  Laterality: N/A;  Bedside  . Radiology with anesthesia Right 10/19/2013    Procedure: RADIOLOGY WITH ANESTHESIA;  Surgeon: Malachy Moan, MD;  Location: [facility] OR;  Service: Radiology;  Laterality: Right;  . Radiology with anesthesia Left 12/28/2013    Procedure: RADIOLOGY WITH ANESTHESIA;  Surgeon: Reola Calkins, MD;  Location: [facility] OR;  Service: Radiology;  Laterality: Left;  . Thrombectomy and revision of arterioventous (av) goretex  graft Left 01/25/2014    Procedure: THROMBECTOMY AND REVISION OF LEFT THIGH ARTERIOVENTOUS (AV) GORETEX  GRAFT WITH PATCH ANGIOPLASTY;  Surgeon: Pryor Ochoa, MD;  Location: [facility] OR;  Service: Vascular;  Laterality: Left;  . Thrombectomy and revision of arterioventous (av) goretex  graft Left 04/10/2014    Procedure: THROMBECTOMY AND REVISION OF ARTERIOVENTOUS (AV) GORETEX  GRAFT;  Surgeon: Sherren Kerns, MD;  Location: [facility] OR;  Service: Vascular;  Laterality: Left;  . Patch angioplasty Left 04/10/2014    Procedure: PATCH ANGIOPLASTY LEFT SFA;  Surgeon: Sherren Kerns, MD;  Location: [facility] OR;  Service: Vascular;  Laterality: Left;  . Insertion of dialysis catheter Bilateral 04/11/2014    Procedure: INSERTION OF DIALYSIS CATHETER RIGHT FEMORAL VEIN; INSERTION OF TRIPLE LUMEN LEFT FEMORAL VEIN CENTRAL LINE; REMOVAL OF DIALYSIS CATHETER IN RIGHT FEMORAL VEIN.;  Surgeon: Nada Libman, MD;  Location: [facility] OR;  Service: Vascular;  Laterality: Bilateral;   No family history on file. History  Substance Use Topics  . Smoking status: Never Smoker   . Smokeless tobacco: Never Used  . Alcohol Use: No    Review of Systems  Unable to perform ROS: Patient nonverbal       Allergies  Review of patient's allergies indicates no known allergies.  Home Medications   Prior to Admission medications   Medication Sig Start Date End Date Taking? Authorizing Provider  atorvastatin (LIPITOR) 20 MG tablet Take 20 mg by mouth at bedtime.     Historical Provider, MD  bisacodyl (DULCOLAX) 5 MG EC tablet Take 1 tablet (5 mg total) by mouth daily as needed for moderate constipation. 04/26/14   Lorenda Hatchet, MD  cinacalcet (SENSIPAR) 30 MG tablet Take 30 mg by mouth 2 (two) times daily with a meal.    Historical Provider, MD  clonazePAM (KLONOPIN) 0.5 MG tablet Take 0.25 mg by mouth 2 (two) times daily.    Historical Provider, MD  folic acid-vitamin b complex-vitamin c-selenium-zinc (DIALYVITE) 3 MG TABS tablet Take 1 tablet by mouth daily.    Historical Provider, MD  midodrine (PROAMATINE) 10 MG tablet Take 15 mg by mouth 3 (three) times daily.     Historical Provider, MD  Nutritional Supplements (FEEDING SUPPLEMENT, NEPRO CARB STEADY,) LIQD Take 237 mLs by mouth 2 (two) times daily between meals. 04/26/14   Lorenda Hatchet, MD  pantoprazole (PROTONIX) 40 MG tablet Take 40 mg by mouth daily.    Historical Provider, MD  phenytoin (DILANTIN) 100 MG ER capsule Take 200 mg by mouth at bedtime.    Historical Provider, MD  predniSONE (DELTASONE) 5 MG tablet Take 2 tablets (10 mg total) by mouth daily with breakfast. Take 10 mg for 3 days then 5 until finished 04/26/14   Lorenda HatchetAdam L Rothman, MD  sevelamer carbonate (RENVELA) 800 MG tablet Take 800 mg by mouth 3 (three) times daily with meals.    Historical Provider, MD   BP 113/71  Pulse 95  Temp(Src) 97.6 F (36.4 C) (Oral)  Resp 14  SpO2 100% Physical Exam  Nursing note and vitals reviewed. Constitutional: He appears well-developed and well-nourished. No distress.  nonverbal  HENT:  Head: Normocephalic and atraumatic.  Mouth/Throat: Oropharynx is clear and moist. No oropharyngeal exudate.  Eyes: Conjunctivae and EOM are  normal. Pupils are equal, round, and reactive to light.  Neck: Normal range of motion. Neck supple.  No meningismus.  Cardiovascular: Normal rate, normal heart sounds and intact distal pulses.   No murmur heard. Tachycardia 100-110  Pulmonary/Chest: Effort normal and breath sounds normal. No respiratory distress.  Abdominal: Soft. There is no tenderness. There is no rebound and no guarding.  Musculoskeletal: Normal range of motion. He exhibits no edema and no tenderness.  Temporary dialysis access in R groin, sutures intact, no active bleeding. Approximately 0.5 cm of cuff exposed. Catheter 27 cm long. Some serous drainage at site.  Not tender  Postsurgical changes L groin  Neurological: He is alert. No cranial nerve deficit. He exhibits normal muscle tone. Coordination normal.  Skin: Skin is warm.  Psychiatric: He has a normal mood and affect. His behavior is normal.    ED Course  Procedures (including critical care time) Labs Review Labs Reviewed  CBC WITH DIFFERENTIAL - Abnormal; Notable for the following:    RBC 3.20 (*)    Hemoglobin 9.4 (*)    HCT 30.2 (*)    RDW 29.6 (*)    Platelets 132 (*)    All other components within normal limits  COMPREHENSIVE METABOLIC PANEL - Abnormal; Notable for the following:    Creatinine, Ser 3.16 (*)    Calcium 8.1 (*)    Alkaline Phosphatase 144 (*)    GFR calc non Af Amer 19 (*)    GFR calc Af Amer 22 (*)    Anion gap 16 (*)    All other components within normal limits  PHENYTOIN LEVEL, TOTAL - Abnormal; Notable for the following:    Phenytoin Lvl <2.5 (*)    All other components within normal limits  PROTIME-INR  GLUCOSE, CAPILLARY  PHENYTOIN LEVEL, TOTAL  TYPE AND SCREEN    Imaging Review Dg Chest Portable 1 View  04/29/2014   CLINICAL DATA:  Dialysis catheter removed.  EXAM: PORTABLE CHEST - 1 VIEW  COMPARISON:  04/24/2014.  FINDINGS: Mediastinum and hilar structures are normal. Subsegmental atelectasis. No pleural effusion or  pneumothorax. No acute osseous abnormality.  IMPRESSION: Bibasilar subsegmental atelectasis. No acute cardiopulmonary disease.   Electronically Signed   By: Maisie Fushomas  Register   On: 04/29/2014 18:04     EKG Interpretation   Date/Time:  Saturday April 29 2014 17:23:18 EDT Ventricular Rate:  116 PR Interval:  122 QRS Duration: 80 QT Interval:  350 QTC Calculation: 486 R Axis:   50 Text Interpretation:  Sinus tachycardia Low voltage, precordial leads  Borderline T abnormalities, diffuse leads Borderline  prolonged QT interval  Baseline wander in lead(s) III Nonspecific ST abnormality Confirmed by  Manus Gunning  MD, Josemaria Brining [phone]) on 04/29/2014 7:49:23 PM      MDM   Final diagnoses:  ESRD (end stage renal disease)   Patient pulled at dialysis access earlier today. There is no active bleeding. The catheter still in place. Patient with tachycardia which is stable based on previous discharge summary. Blood Pressure stable.  Hemoglobin is stable. BP 90s-110s in ED which is consistent with previous values in discharge summary. HR 90s.  Case discussed with Dr. Hart Rochester the vascular surgery. He states catheter is still able to be used. It'll need to be electively exchanged. Patient's caregiver Lavell Anchors [phone] will be able to arrange transportation. Dr. Hart Rochester states this will likely be done on Wednesday. Catheter still able to be used for dialysis.   Dr. Hart Rochester elected to take patient to OR today for exchange of catheter.  Dilantin level was checked and found to be subtherapeutic.  Pharmacy consulted for dosing given his low albumin.  No seizure activity in the ED, none in years per caregiver. It appears patient went to OR before receiving dilantin dose.  BP 113/71  Pulse 95  Temp(Src) 97.6 F (36.4 C) (Oral)  Resp 14  SpO2 100%   Glynn Octave, MD 04/30/14 0126

## 2014-04-29 NOTE — Anesthesia Preprocedure Evaluation (Addendum)
Anesthesia Evaluation  Patient identified by MRN, date of birth, ID band Patient confused and Patient unresponsive    Reviewed: Allergy & Precautions, H&P , NPO status , Patient's Chart, lab work & pertinent test results  History of Anesthesia Complications (+) DIFFICULT AIRWAY and history of anesthetic complications  Airway Mallampati: II TM Distance: >3 FB Neck ROM: Limited    Dental  (+) Dental Advisory Given   Pulmonary neg pulmonary ROS,  breath sounds clear to auscultation        Cardiovascular hypertension, Pt. on medications + Peripheral Vascular Disease (h/o SVC syndrome) and +CHF Rhythm:Regular Rate:Tachycardia  '14 ECHO: EF 60-65%, valves OK   Neuro/Psych Seizures -, Well Controlled,  PSYCHIATRIC DISORDERS Anxiety Mute, mental deficiency    GI/Hepatic Neg liver ROS, PUD, GERD-  Medicated and Controlled,  Endo/Other  negative endocrine ROS  Renal/GU ESRF and DialysisRenal disease     Musculoskeletal   Abdominal   Peds  (+) mental retardation Hematology   Anesthesia Other Findings   Reproductive/Obstetrics                          Anesthesia Physical  Anesthesia Plan  ASA: III and emergent  Anesthesia Plan: MAC   Post-op Pain Management:    Induction:   Airway Management Planned: Nasal Cannula  Additional Equipment:   Intra-op Plan:   Post-operative Plan:   Informed Consent: I have reviewed the patients History and Physical, chart, labs and discussed the procedure including the risks, benefits and alternatives for the proposed anesthesia with the patient or authorized representative who has indicated his/her understanding and acceptance.   Consent reviewed with POA and Dental advisory given  Plan Discussed with: CRNA, Anesthesiologist and Surgeon  Anesthesia Plan Comments:        Anesthesia Quick Evaluation

## 2014-04-29 NOTE — Op Note (Signed)
OPERATIVE REPORT  Date of Surgery: 04/29/2014  Surgeon: Josephina GipJames Addie Cederberg, MD  Assistant: Nurse  Pre-op Diagnosis: ESRD with exposed cuff right femoral hemodialysis catheter Post-op Diagnosis: Same  Procedure: Procedure(s): #1 removal right femoral hemodialysis catheter over guidewire #2 insertion new femoral hemodialysis catheter-27 cm  Anesthesia: MAC  EBL: Minimal  Complications: None  Procedure Details: Patient taken to the operating room placed in supine position at which time sedation was given for satisfactory anesthesia. After prepping and draping the right inguinal area in the routine sterile manner an infiltration of 1% Xylocaine a short longitudinal incision was made inguinal crease where the catheter was palpated. The cuff was exposed at the exit site the patient had pulled this back inadvertently. Incision carried down through the skin and subcutaneous tissue the catheter identified and grasped with a hemostat. The catheter was transected distally. Guidewire was passed through the catheter in the ARE removed. This was done under fluoroscopic guidance. The tip of the previous catheter extended to a point midway between the aortic bifurcation and the diaphragm. 50 cm catheter was found to be too long and previous procedure. After dilating the tract over the wire a new 27 cm cuff the hemodialysis catheter was positioned and guidewire removed. A new tunnel was created more medially catheter delivered through the tunnel secured very well with 2 nylon sutures at the exit site and 3 additional nylon sutures. Both ports easily flushed with heparin saline. The wound was closed with Vicryl in subcuticular fashion. The remaining portion of the old catheter was removed from the lateral exit site and this wound closed with a simple nylon suture. Sterile dressing applied. Position of the catheter was confirmed fluoroscopically. Patient taken to recovery room in stable condition   Josephina GipJames Hong Timm,  MD 04/29/2014 9:43 PM

## 2014-05-01 ENCOUNTER — Encounter (HOSPITAL_COMMUNITY): Payer: Self-pay | Admitting: Vascular Surgery

## 2014-05-01 LAB — CULTURE, BLOOD (ROUTINE X 2): Culture: NO GROWTH

## 2014-05-02 ENCOUNTER — Encounter (HOSPITAL_COMMUNITY): Payer: Self-pay | Admitting: Emergency Medicine

## 2014-05-02 ENCOUNTER — Emergency Department (HOSPITAL_COMMUNITY): Payer: Medicare Other

## 2014-05-02 ENCOUNTER — Inpatient Hospital Stay (HOSPITAL_COMMUNITY)
Admission: EM | Admit: 2014-05-02 | Discharge: 2014-05-12 | DRG: 314 | Disposition: A | Discharge status: Home / Self Care | Payer: Medicare Other | Attending: Internal Medicine | Admitting: Internal Medicine

## 2014-05-02 DIAGNOSIS — Z992 Dependence on renal dialysis: Secondary | ICD-10-CM | POA: Diagnosis not present

## 2014-05-02 DIAGNOSIS — I509 Heart failure, unspecified: Secondary | ICD-10-CM | POA: Diagnosis present

## 2014-05-02 DIAGNOSIS — E2749 Other adrenocortical insufficiency: Secondary | ICD-10-CM

## 2014-05-02 DIAGNOSIS — I248 Other forms of acute ischemic heart disease: Secondary | ICD-10-CM | POA: Diagnosis present

## 2014-05-02 DIAGNOSIS — N2581 Secondary hyperparathyroidism of renal origin: Secondary | ICD-10-CM | POA: Diagnosis present

## 2014-05-02 DIAGNOSIS — N186 End stage renal disease: Secondary | ICD-10-CM | POA: Diagnosis present

## 2014-05-02 DIAGNOSIS — R0603 Acute respiratory distress: Secondary | ICD-10-CM | POA: Diagnosis present

## 2014-05-02 DIAGNOSIS — E875 Hyperkalemia: Secondary | ICD-10-CM | POA: Diagnosis present

## 2014-05-02 DIAGNOSIS — Y838 Other surgical procedures as the cause of abnormal reaction of the patient, or of later complication, without mention of misadventure at the time of the procedure: Secondary | ICD-10-CM | POA: Diagnosis present

## 2014-05-02 DIAGNOSIS — R6521 Severe sepsis with septic shock: Secondary | ICD-10-CM

## 2014-05-02 DIAGNOSIS — R4701 Aphasia: Secondary | ICD-10-CM | POA: Diagnosis present

## 2014-05-02 DIAGNOSIS — T82898A Other specified complication of vascular prosthetic devices, implants and grafts, initial encounter: Secondary | ICD-10-CM | POA: Diagnosis not present

## 2014-05-02 DIAGNOSIS — A419 Sepsis, unspecified organism: Secondary | ICD-10-CM | POA: Diagnosis present

## 2014-05-02 DIAGNOSIS — M899 Disorder of bone, unspecified: Secondary | ICD-10-CM | POA: Diagnosis present

## 2014-05-02 DIAGNOSIS — I9589 Other hypotension: Secondary | ICD-10-CM | POA: Diagnosis present

## 2014-05-02 DIAGNOSIS — D696 Thrombocytopenia, unspecified: Secondary | ICD-10-CM | POA: Diagnosis present

## 2014-05-02 DIAGNOSIS — Y832 Surgical operation with anastomosis, bypass or graft as the cause of abnormal reaction of the patient, or of later complication, without mention of misadventure at the time of the procedure: Secondary | ICD-10-CM | POA: Diagnosis not present

## 2014-05-02 DIAGNOSIS — A4152 Sepsis due to Pseudomonas: Secondary | ICD-10-CM | POA: Diagnosis present

## 2014-05-02 DIAGNOSIS — G9340 Encephalopathy, unspecified: Secondary | ICD-10-CM | POA: Diagnosis present

## 2014-05-02 DIAGNOSIS — Z515 Encounter for palliative care: Secondary | ICD-10-CM | POA: Diagnosis not present

## 2014-05-02 DIAGNOSIS — I2489 Other forms of acute ischemic heart disease: Secondary | ICD-10-CM | POA: Diagnosis present

## 2014-05-02 DIAGNOSIS — I959 Hypotension, unspecified: Secondary | ICD-10-CM

## 2014-05-02 DIAGNOSIS — E874 Mixed disorder of acid-base balance: Secondary | ICD-10-CM | POA: Diagnosis present

## 2014-05-02 DIAGNOSIS — K219 Gastro-esophageal reflux disease without esophagitis: Secondary | ICD-10-CM | POA: Diagnosis present

## 2014-05-02 DIAGNOSIS — E785 Hyperlipidemia, unspecified: Secondary | ICD-10-CM | POA: Diagnosis present

## 2014-05-02 DIAGNOSIS — R6510 Systemic inflammatory response syndrome (SIRS) of non-infectious origin without acute organ dysfunction: Secondary | ICD-10-CM | POA: Diagnosis present

## 2014-05-02 DIAGNOSIS — R4182 Altered mental status, unspecified: Secondary | ICD-10-CM | POA: Diagnosis present

## 2014-05-02 DIAGNOSIS — R6520 Severe sepsis without septic shock: Secondary | ICD-10-CM

## 2014-05-02 DIAGNOSIS — R569 Unspecified convulsions: Secondary | ICD-10-CM | POA: Diagnosis present

## 2014-05-02 DIAGNOSIS — Z66 Do not resuscitate: Secondary | ICD-10-CM | POA: Diagnosis present

## 2014-05-02 DIAGNOSIS — T827XXA Infection and inflammatory reaction due to other cardiac and vascular devices, implants and grafts, initial encounter: Principal | ICD-10-CM | POA: Diagnosis present

## 2014-05-02 DIAGNOSIS — F79 Unspecified intellectual disabilities: Secondary | ICD-10-CM | POA: Diagnosis present

## 2014-05-02 DIAGNOSIS — A498 Other bacterial infections of unspecified site: Secondary | ICD-10-CM

## 2014-05-02 DIAGNOSIS — D649 Anemia, unspecified: Secondary | ICD-10-CM

## 2014-05-02 DIAGNOSIS — I120 Hypertensive chronic kidney disease with stage 5 chronic kidney disease or end stage renal disease: Secondary | ICD-10-CM | POA: Diagnosis present

## 2014-05-02 DIAGNOSIS — D638 Anemia in other chronic diseases classified elsewhere: Secondary | ICD-10-CM | POA: Diagnosis present

## 2014-05-02 DIAGNOSIS — M949 Disorder of cartilage, unspecified: Secondary | ICD-10-CM

## 2014-05-02 HISTORY — DX: Sepsis due to Pseudomonas: A41.52

## 2014-05-02 LAB — I-STAT TROPONIN, ED: Troponin i, poc: 0.04 ng/mL (ref 0.00–0.08)

## 2014-05-02 LAB — CBC
HCT: 28.5 % — ABNORMAL LOW (ref 39.0–52.0)
HEMOGLOBIN: 8.8 g/dL — AB (ref 13.0–17.0)
MCH: 29.2 pg (ref 26.0–34.0)
MCHC: 30.9 g/dL (ref 30.0–36.0)
MCV: 94.7 fL (ref 78.0–100.0)
PLATELETS: 101 10*3/uL — AB (ref 150–400)
RBC: 3.01 MIL/uL — ABNORMAL LOW (ref 4.22–5.81)
RDW: 27.3 % — AB (ref 11.5–15.5)
WBC: 3.2 10*3/uL — ABNORMAL LOW (ref 4.0–10.5)

## 2014-05-02 LAB — COMPREHENSIVE METABOLIC PANEL
ALBUMIN: 3 g/dL — AB (ref 3.5–5.2)
AST: 28 U/L (ref 0–37)
Alkaline Phosphatase: 125 U/L — ABNORMAL HIGH (ref 39–117)
Anion gap: 22 — ABNORMAL HIGH (ref 5–15)
BUN: 18 mg/dL (ref 6–23)
CO2: 22 mEq/L (ref 19–32)
Calcium: 7.5 mg/dL — ABNORMAL LOW (ref 8.4–10.5)
Chloride: 99 mEq/L (ref 96–112)
Creatinine, Ser: 5.44 mg/dL — ABNORMAL HIGH (ref 0.50–1.35)
GFR calc Af Amer: 12 mL/min — ABNORMAL LOW (ref >90)
GFR calc non Af Amer: 10 mL/min — ABNORMAL LOW (ref >90)
GLUCOSE: 112 mg/dL — AB (ref 70–99)
POTASSIUM: 3.7 meq/L (ref 3.7–5.3)
SODIUM: 143 meq/L (ref 137–147)
TOTAL PROTEIN: 6.2 g/dL (ref 6.0–8.3)
Total Bilirubin: 0.8 mg/dL (ref 0.3–1.2)

## 2014-05-02 LAB — I-STAT ARTERIAL BLOOD GAS, ED
Acid-Base Excess: 3 mmol/L — ABNORMAL HIGH (ref 0.0–2.0)
Bicarbonate: 26.1 mEq/L — ABNORMAL HIGH (ref 20.0–24.0)
O2 Saturation: 100 %
TCO2: 27 mmol/L (ref 0–100)
pCO2 arterial: 33.2 mmHg — ABNORMAL LOW (ref 35.0–45.0)
pH, Arterial: 7.503 — ABNORMAL HIGH (ref 7.350–7.450)
pO2, Arterial: 437 mmHg — ABNORMAL HIGH (ref 80.0–100.0)

## 2014-05-02 LAB — PHENYTOIN LEVEL, TOTAL

## 2014-05-02 LAB — I-STAT CG4 LACTIC ACID, ED: LACTIC ACID, VENOUS: 5.75 mmol/L — AB (ref 0.5–2.2)

## 2014-05-02 LAB — AMMONIA: Ammonia: 40 umol/L (ref 11–60)

## 2014-05-02 MED ORDER — SEVELAMER CARBONATE 800 MG PO TABS
800.0000 mg | ORAL_TABLET | Freq: Three times a day (TID) | ORAL | Status: DC
  Administered 2014-05-04 – 2014-05-10 (×6): 800 mg via ORAL
  Filled 2014-05-02 (×31): qty 1

## 2014-05-02 MED ORDER — DIALYVITE 3000 3 MG PO TABS: 1.0000 | ORAL_TABLET | Freq: Every day | ORAL | Status: DC

## 2014-05-02 MED ORDER — SODIUM CHLORIDE 0.9 % IV SOLN
1000.0000 mg | Freq: Once | INTRAVENOUS | Status: AC
  Administered 2014-05-02: 1000 mg via INTRAVENOUS
  Filled 2014-05-02: qty 20

## 2014-05-02 MED ORDER — SODIUM CHLORIDE 0.9 % IV BOLUS (SEPSIS)
500.0000 mL | Freq: Once | INTRAVENOUS | Status: AC
  Administered 2014-05-02: 500 mL via INTRAVENOUS

## 2014-05-02 MED ORDER — MIDODRINE HCL 5 MG PO TABS
15.0000 mg | ORAL_TABLET | Freq: Three times a day (TID) | ORAL | Status: DC
  Administered 2014-05-03 – 2014-05-11 (×20): 15 mg via ORAL
  Filled 2014-05-02 (×30): qty 3

## 2014-05-02 MED ORDER — VANCOMYCIN HCL IN DEXTROSE 1-5 GM/200ML-% IV SOLN: 1000.0000 mg | Freq: Once | INTRAVENOUS | Status: DC

## 2014-05-02 MED ORDER — CEFEPIME HCL 2 G IJ SOLR
2.0000 g | INTRAMUSCULAR | Status: DC
  Filled 2014-05-02: qty 2

## 2014-05-02 MED ORDER — SODIUM CHLORIDE 0.9 % IJ SOLN
3.0000 mL | Freq: Two times a day (BID) | INTRAMUSCULAR | Status: DC
  Administered 2014-05-04 – 2014-05-12 (×6): 3 mL via INTRAVENOUS

## 2014-05-02 MED ORDER — CINACALCET HCL 30 MG PO TABS
30.0000 mg | ORAL_TABLET | Freq: Two times a day (BID) | ORAL | Status: DC
  Administered 2014-05-04 – 2014-05-10 (×7): 30 mg via ORAL
  Filled 2014-05-02 (×21): qty 1

## 2014-05-02 MED ORDER — PANTOPRAZOLE SODIUM 40 MG PO TBEC
40.0000 mg | DELAYED_RELEASE_TABLET | Freq: Every day | ORAL | Status: DC
  Administered 2014-05-03 – 2014-05-11 (×5): 40 mg via ORAL
  Filled 2014-05-02 (×7): qty 1

## 2014-05-02 MED ORDER — INSULIN ASPART 100 UNIT/ML ~~LOC~~ SOLN
0.0000 [IU] | SUBCUTANEOUS | Status: DC
  Administered 2014-05-03: 2 [IU] via SUBCUTANEOUS
  Administered 2014-05-03: 3 [IU] via SUBCUTANEOUS
  Administered 2014-05-03: 2 [IU] via SUBCUTANEOUS
  Administered 2014-05-04 – 2014-05-06 (×5): 1 [IU] via SUBCUTANEOUS

## 2014-05-02 MED ORDER — SODIUM CHLORIDE 0.9 % IV BOLUS (SEPSIS): 1000.0000 mL | Freq: Once | INTRAVENOUS | Status: DC

## 2014-05-02 MED ORDER — RENA-VITE PO TABS
1.0000 | ORAL_TABLET | Freq: Every day | ORAL | Status: DC
  Administered 2014-05-03 – 2014-05-11 (×7): 1 via ORAL
  Filled 2014-05-02 (×11): qty 1

## 2014-05-02 MED ORDER — VANCOMYCIN HCL 10 G IV SOLR
1250.0000 mg | Freq: Once | INTRAVENOUS | Status: AC
  Administered 2014-05-02: 1250 mg via INTRAVENOUS
  Filled 2014-05-02: qty 1250

## 2014-05-02 MED ORDER — HYDROCORTISONE NA SUCCINATE PF 100 MG IJ SOLR
100.0000 mg | Freq: Once | INTRAMUSCULAR | Status: AC
  Administered 2014-05-02: 100 mg via INTRAVENOUS
  Filled 2014-05-02: qty 2

## 2014-05-02 MED ORDER — DEXTROSE 5 % IV SOLN: 2.0000 g | Freq: Once | INTRAVENOUS | Status: DC

## 2014-05-02 MED ORDER — VANCOMYCIN HCL 500 MG IV SOLR
500.0000 mg | INTRAVENOUS | Status: DC
  Filled 2014-05-02: qty 500

## 2014-05-02 MED ORDER — PHENYTOIN SODIUM EXTENDED 100 MG PO CAPS
200.0000 mg | ORAL_CAPSULE | Freq: Every day | ORAL | Status: DC
  Administered 2014-05-03 – 2014-05-11 (×7): 200 mg via ORAL
  Filled 2014-05-02 (×11): qty 2

## 2014-05-02 NOTE — ED Notes (Signed)
Spoke with admitting Dr. Yetta BarreJones about patients BP. According to the MD patients systolic BP runs in the 70's normally. Patient is also a DNR.

## 2014-05-02 NOTE — Consult Note (Signed)
ANTIBIOTIC CONSULT NOTE - INITIAL  Pharmacy Consult for Vancomycin and Cefepime Indication: rule out sepsis  No Known Allergies  Patient Measurements: Patient wt = 58.1kg  Vital Signs: Temp: 97.8 F (36.6 C) (07/14 1625) Temp src: Axillary (07/14 1625) BP: 168/121 mmHg (07/14 1709) Pulse Rate: 145 (07/14 1625) Intake/Output from previous day:   Intake/Output from this shift:    Labs:  Recent Labs  04/29/14 1850 05/02/14 1639  WBC 7.1 3.2*  HGB 9.4* 8.8*  PLT 132* 101*  CREATININE 3.16* 5.44*   The CrCl is unknown because both a height and weight (above a minimum accepted value) are required for this calculation.  Microbiology: Recent Results (from the past 720 hour(s))  MRSA PCR SCREENING     Status: None   Collection Time    04/11/14 12:56 AM      Result Value Ref Range Status   MRSA by PCR NEGATIVE  NEGATIVE Final   Comment:            The GeneXpert MRSA Assay (FDA     approved for NASAL specimens     only), is one component of a     comprehensive MRSA colonization     surveillance program. It is not     intended to diagnose MRSA     infection nor to guide or     monitor treatment for     MRSA infections.  CULTURE, BLOOD (ROUTINE X 2)     Status: None   Collection Time    04/24/14  6:30 PM      Result Value Ref Range Status   Specimen Description BLOOD LEFT CENTRAL LINE   Final   Special Requests BOTTLES DRAWN AEROBIC AND ANAEROBIC 10CC   Final   Culture  Setup Time     Final   Value: 04/25/2014 00:22     Performed at [facility]   Culture     Final   Value: NO GROWTH 5 DAYS     Performed at [facility]   Report Status 05/01/2014 FINAL   Final    Medical History: Past Medical History  Diagnosis Date  . Heart murmur, systolic 08/10/2009  . Syncope 05/07/2009  . 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 Esophageal varices 11/07/2008  . Gastric ulcer 10/09    antral, with h pylori positive  . Congestive heart failure  03/06/2008  . Cellulitis and abscess of leg, except foot 03/06/2008  . Secondary hyperparathyroidism 02/02/2008  . Mute 02/02/2008  . Hyperlipidemia 02/02/2008  . Anemia 02/02/2008  . ESRD (end stage renal disease)     TTS hemodialysis  . GERD (gastroesophageal reflux disease)   . Hypertension 02/02/2008    in history  . Mental retardation 02/02/2008  . Mute   . Seizures     "non in a while at home". none in past year .  [name]. Complication of anesthesia     in the past BP has dropped    Assessment: 63yom was at dialysis today, only completed 2 hours and then developed hypotension and altered mental status. On admission lactic acid is elevated. CXR negative. Cultures pending. He will begin empiric vancomycin and cefepime for probable sepsis.   Usual dialysis is TTS.  Goal of Therapy:  Pre-HD vancomycin level 15-25  Plan:  1) Vancomycin 1250mg  IV x 1 loading dose then 500mg  IV qHD 2) Cefepime 2g IV x 1 now then 2g IV qHD 3) Follow HD schedule, cultures, LOT, level if needed  Fredrik Rigger 05/02/2014,5:52 PM

## 2014-05-02 NOTE — ED Notes (Signed)
MD at bedside. 

## 2014-05-02 NOTE — ED Notes (Signed)
Pt in via EMS from kidney center, pt was two hours into dialysis and had a sudden change in mental status, pt would not respond per his normal, BP was low at that time, given 1L IV bolus at that time, symptoms did not improve, upon EMS arrival pt BP 60/42, pt would not sit up on own or track with eyes- pt has history of MR and is nonverbal at baseline but will normally grunt in response and is alert- pt did not complete dialysis- center also reported that the noted drainage from dialysis catheter to right groin area. Pt initial O2 level on RA was 50%, unable to tolerate bipap en route.

## 2014-05-02 NOTE — ED Notes (Addendum)
Patient in possession of his pants only. Family member took his gym bag with his blanket. No other personal items noted.

## 2014-05-02 NOTE — H&P (Signed)
Date: 05/02/2014               Patient Name:  [name] MRN: [id number]  DOB: 1950-11-10 Age / Sex: 63 y.o., male   PCP: Courtney ParisEden W Jones, MD         Medical Service: Internal Medicine Teaching Service         Attending Physician: Dr. Levert FeinsteinJames M Granfortuna, MD    First Contact: Dr. Valentino Saxonothman Pager: [phone]  Second Contact: Dr. Burtis JunesSadek Pager: [phone]       After Hours (After 5p/  First Contact Pager: [phone]  weekends / holidays): Second Contact Pager: [phone]   Chief Complaint: altered mental status and respiratory distress  History of Present Illness: [name] is a 63 y.o. Males with PMHx of mental retardation, ESRD on dialysis, hypotension, acquired adrenal insufficiency, anemia, and history of seizures who presents to the ED from dialysis center for acute change in mental status. This history is taken from ED note and from the pt's sister. Pt was two hours into dialysis and had a sudden change in mental status. Pt would not respond as normal. Normally pt is nonverbal at baseline, but will grunt in response. Staff said he wouldn't grunt, sit up on his own or track with his eyes. His BP was noted to be low at that time and he was given 1L IV bolus. His symptoms did not improve and EMS was called. [name] states that 4 days ago pt pulled at his R femoral HD catheter, and pulled it partially out. He had it replaced the next day, Sunday the 11th by Dr. Hart RochesterLawson, vascular surgeon, without complication. The next day, Monday the 12th, pt's sister and caretaker noted that he was more sluggish, hard to get out of bed, and wasn't eating. Eventually, they were able to get him to eat and he was doing okay the past 2 days, until today when his blood pressure dropped at dialysis, and he developed shortness of breath and had an acute change in mental status.   Meds: Current Facility-Administered Medications  Medication Dose Route Frequency Provider Last Rate Last Dose  . ceFEPIme (MAXIPIME) 2 g in  dextrose 5 % 50 mL IVPB  2 g Intravenous Once Hessie DienerJennifer Sue WoodburnMarkle, [facility]      . [START ON 05/04/2014] ceFEPIme (MAXIPIME) 2 g in dextrose 5 % 50 mL IVPB  2 g Intravenous Q T,Th,Sa-HD Fredrik RiggerJennifer Sue Markle, RPH      . hydrocortisone sodium succinate (SOLU-CORTEF) 100 MG injection 100 mg  100 mg Intravenous Once Courtney ParisEden W Jones, MD      . sodium chloride 0.9 % bolus 500 mL  500 mL Intravenous Once Courtney ParisEden W Jones, MD      . Melene Muller[START ON 05/04/2014] vancomycin (VANCOCIN) 500 mg in sodium chloride 0.9 % 100 mL IVPB  500 mg Intravenous Q T,Th,Sa-HD Fredrik RiggerJennifer Sue Markle, [facility]        Allergies: Allergies as of 05/02/2014  . (No Known Allergies)   Past Medical History  Diagnosis Date  . Heart murmur, systolic 08/10/2009  . Syncope 05/07/2009  . 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 Esophageal varices 11/07/2008  . Gastric ulcer 10/09    antral, with h pylori positive  . Congestive heart failure 03/06/2008  . Cellulitis and abscess of leg, except foot 03/06/2008  . Secondary hyperparathyroidism 02/02/2008  . Mute 02/02/2008  . Hyperlipidemia 02/02/2008  . Anemia 02/02/2008  . ESRD (end stage renal disease)     TTS  hemodialysis  . GERD (gastroesophageal reflux disease)   . Hypertension 02/02/2008    in history  . Mental retardation 02/02/2008  . Mute   . Seizures     "non in a while at home". none in past year .  Marland Kitchen Complication of anesthesia     in the past BP has dropped    Past Surgical History  Procedure Laterality Date  . Left forearm graft      for HD  . Arteriovenous graft placement  11/22/10    Right thigh AVG  . Thrombectomy and revision of arterioventous (av) goretex  graft    . Thrombectomy and revision of arterioventous (av) goretex  graft  10/22/2012    Procedure: THROMBECTOMY AND REVISION OF ARTERIOVENTOUS (AV) GORETEX  GRAFT;  Surgeon: Larina Earthly, MD;  Location: [name] OR;  Service: Vascular;  Laterality: Right;  . Thrombectomy w/ embolectomy  11/10/2012    Procedure: THROMBECTOMY  ARTERIOVENOUS GORE-TEX GRAFT;  Surgeon: Pryor Ochoa, MD;  Location: [facility] OR;  Service: Vascular;  Laterality: Right;  . Thrombectomy and revision of arterioventous (av) goretex  graft Right 12/08/2012    Procedure: THROMBECTOMY AND REVISION OF ARTERIOVENTOUS (AV) GORETEX  GRAFT right thigh;  Surgeon: Sherren Kerns, MD;  Location: [facility] OR;  Service: Vascular;  Laterality: Right;  Susie Cassette N/A 12/08/2012    Procedure: VENOGRAM;  Surgeon: Sherren Kerns, MD;  Location: [facility] OR;  Service: Vascular;  Laterality: N/A;  Intraoperative Central venogram  . Thrombectomy w/ embolectomy Right 12/12/2012    Procedure: THROMBECTOMY ARTERIOVENOUS GORE-TEX GRAFT;  Surgeon: Nada Libman, MD;  Location: [facility] OR;  Service: Vascular;  Laterality: Right;  . Insertion of dialysis catheter Left 12/14/2012    Procedure: INSERTION OF DIALYSIS CATHETER;  Surgeon: Nada Libman, MD;  Location: [facility] OR;  Service: Vascular;  Laterality: Left;  . Insertion of dialysis catheter Right 01/13/2013    Procedure: INSERTION OF DIALYSIS CATHETER;  Surgeon: Nada Libman, MD;  Location: [facility] OR;  Service: Vascular;  Laterality: Right;  . Removal of a dialysis catheter Left 01/13/2013    Procedure: REMOVAL OF A DIALYSIS CATHETER;  Surgeon: Nada Libman, MD;  Location: [facility] OR;  Service: Vascular;  Laterality: Left;  . Av fistula placement Left 02/11/2013    Procedure: INSERTION OF ARTERIOVENOUS (AV) GORE-TEX GRAFT THIGH;  Surgeon: Nada Libman, MD;  Location: [facility] OR;  Service: Vascular;  Laterality: Left;  using 6mm x 50cm Gore-Tex Vascular Graft  . Esophagogastroduodenoscopy N/A 02/16/2013    Procedure: ESOPHAGOGASTRODUODENOSCOPY (EGD);  Surgeon: Vertell Novak., MD;  Location: [facility] ENDOSCOPY;  Service: Endoscopy;  Laterality: N/A;  bedside  . Esophagogastroduodenoscopy N/A 09/14/2013    Procedure: ESOPHAGOGASTRODUODENOSCOPY (EGD);  Surgeon: Vertell Novak., MD;  Location: [facility] ENDOSCOPY;  Service: Endoscopy;  Laterality: N/A;   control of bleeding if needed  . Esophagogastroduodenoscopy N/A 10/03/2013    Procedure: ESOPHAGOGASTRODUODENOSCOPY (EGD);  Surgeon: Theda Belfast, MD;  Location: [facility] ENDOSCOPY;  Service: Endoscopy;  Laterality: N/A;  Bedside  . Radiology with anesthesia Right 10/19/2013    Procedure: RADIOLOGY WITH ANESTHESIA;  Surgeon: Malachy Moan, MD;  Location: [facility] OR;  Service: Radiology;  Laterality: Right;  . Radiology with anesthesia Left 12/28/2013    Procedure: RADIOLOGY WITH ANESTHESIA;  Surgeon: Reola Calkins, MD;  Location: [facility] OR;  Service: Radiology;  Laterality: Left;  . Thrombectomy and revision of arterioventous (av) goretex  graft Left 01/25/2014    Procedure: THROMBECTOMY AND REVISION OF  LEFT THIGH ARTERIOVENTOUS (AV) GORETEX  GRAFT WITH PATCH ANGIOPLASTY;  Surgeon: Pryor Ochoa, MD;  Location: [facility] OR;  Service: Vascular;  Laterality: Left;  . Thrombectomy and revision of arterioventous (av) goretex  graft Left 04/10/2014    Procedure: THROMBECTOMY AND REVISION OF ARTERIOVENTOUS (AV) GORETEX  GRAFT;  Surgeon: Sherren Kerns, MD;  Location: [facility] OR;  Service: Vascular;  Laterality: Left;  . Patch angioplasty Left 04/10/2014    Procedure: PATCH ANGIOPLASTY LEFT SFA;  Surgeon: Sherren Kerns, MD;  Location: [facility] OR;  Service: Vascular;  Laterality: Left;  . Insertion of dialysis catheter Bilateral 04/11/2014    Procedure: INSERTION OF DIALYSIS CATHETER RIGHT FEMORAL VEIN; INSERTION OF TRIPLE LUMEN LEFT FEMORAL VEIN CENTRAL LINE; REMOVAL OF DIALYSIS CATHETER IN RIGHT FEMORAL VEIN.;  Surgeon: Nada Libman, MD;  Location: [facility] OR;  Service: Vascular;  Laterality: Bilateral;  . Exchange of a dialysis catheter Right 04/29/2014    Procedure: EXCHANGE OF A  FEMORAL DIALYSIS CATHETER;  Surgeon: Pryor Ochoa, MD;  Location: [facility] OR;  Service: Vascular;  Laterality: Right;   History reviewed. No pertinent family history. History   Social History  . Marital Status: Single    Spouse Name: N/A    Number  of Children: 0  . Years of Education: N/A   Occupational History  .     Social History Main Topics  . Smoking status: Never Smoker   . Smokeless tobacco: Never Used  . Alcohol Use: No  . Drug Use: No  . Sexual Activity: No   Other Topics Concern  . Not on file   Social History Narrative   Patient is living with care providers.    Patient is right handed.    Patient does not have any children.    Patient is on disability           Review of Systems: Limited Review of Systems: 2/2 Pt is nonverbal. ROS provided by sister at bedside General: Admits to chills, fatigue and appetite change. Denies fever. Respiratory: Admits to SOB, Denies cough, chest tightness, and wheezing.   Cardiovascular: Denies chest pain and palpitations.  Gastrointestinal: Denies nausea, vomiting, abdominal pain, diarrhea, constipation, blood in stool and abdominal distention.  Genitourinary: Denies dysuria, urgency, frequency, hematuria, and flank pain. Endocrine: Denies hot or cold intolerance, polyuria, and polydipsia. Musculoskeletal: Denies myalgias, back pain, joint swelling, arthralgias and gait problem.  Skin: Denies pallor, rash and wounds.  Neurological: Denies dizziness, seizures, syncope, weakness, lightheadedness, numbness and headaches.  Psychiatric/Behavioral: Denies mood changes, confusion, nervousness, sleep disturbance and agitation.  Physical Exam: Filed Vitals:   05/02/14 1936 05/02/14 1952 05/02/14 2014 05/02/14 2131  BP: 65/46 71/43  97/60  Pulse: 122     Temp:   97.2 F (36.2 C) 98.3 F (36.8 C)  TempSrc:   Oral Oral  Resp: 23 37  24  SpO2:   97%    General: Vital signs reviewed.  Patient is in NAD, laying in bed. Nonverbal, but cooperative with exam. Head: Normocephalic and atraumatic. Eyes: PERRL, EOMI, conjunctivae normal, No scleral icterus.  Neck: Supple, trachea midline, normal ROM, No JVD, masses, thyromegaly, or carotid bruit present.  Cardiovascular: Tachycardic,  2/6 blowing systolic ejection murmur, gallops, or rubs. Pulmonary/Chest: Decreased breath sounds, but CTA bilaterally, no wheezes, rales, or rhonchi. No accessory muscle use. Abdominal: Soft, non-tender, non-distended, BS +, no masses, organomegaly, or guarding present.  Musculoskeletal: No joint deformities, erythema, or stiffness, ROM full and nontender. Extremities: R femoral HD  catheter, bandaged and bloody. No obvious signs of infection. No increased warmth, erythema or edema around the site. Negative lower extremity edema bialterally, pulses symmetric and intact bilaterally. No cyanosis or clubbing. Neurological: Awake and alert, non-verbal, at baseline mental status--grunting, tracking with eyes, following commands.  Skin: Warm, dry and intact. No rashes or erythema. Psychiatric: Non-verbal. Mood and cognition are at baseline.  Lab results: Basic Metabolic Panel:  Recent Labs  16/10/96 1639  NA 143  K 3.7  CL 99  CO2 22  GLUCOSE 112*  BUN 18  CREATININE 5.44*  CALCIUM 7.5*   Liver Function Tests:  Recent Labs  05/02/14 1639  AST 28  ALT <5  ALKPHOS 125*  BILITOT 0.8  PROT 6.2  ALBUMIN 3.0*   No results found for this basename: LIPASE, AMYLASE,  in the last 72 hours  Recent Labs  05/02/14 1639  AMMONIA 40   CBC:  Recent Labs  05/02/14 1639  WBC 3.2*  HGB 8.8*  HCT 28.5*  MCV 94.7  PLT 101*    Imaging results:  Dg Chest Portable 1 View  05/02/2014   CLINICAL DATA:  Altered mental status ; history of heart murmur and CHF and hypertension  EXAM: PORTABLE CHEST - 1 VIEW  COMPARISON:  Portable chest x-ray of April 29, 2014  FINDINGS: The lungs are mildly hypoinflated. There is no focal infiltrate. The heart and pulmonary vascularity are unremarkable. There is no pleural effusion. There is moderate gaseous distention of the stomach. The bony thorax is unremarkable.  IMPRESSION: There is bilateral pulmonary hypoinflation but no evidence of acute cardiopulmonary  abnormality.   Electronically Signed   By: David  [place]   On: 05/02/2014 16:55    Other results: EKG:  unchanged from previous tracings, sinus tachycardia.  Assessment & Plan by Problem:  SIRS: Patient had and episode of AMS, decreased from baseline, tachycardia, respiratory distress, and hypotension 2 hours into dialysis today. He required BiPAP initially and is now 97% on oxygen 2 L via [place]. He is leukopenic at WBC of 3.2, Lactic acid level of 5.75 initially. Started on Vanc/Cefepime in ED. We do not have a  clear source of infection. This may be 2/2 new HD catheter placement in left femoral 3 days ago or 2/2 patient being overdialyzed and becoming symptomatically hypotensive. Pt was given 1.5 L NS bolus. Another possibility is adrenal insufficiency. Pt received Solu-Cortef 100 IV once. PE unlikely since respiratory distress resolved quickly after bipap and he was able to sat 97% on 2L oxygen via [place]. ABG showed respiratory alkalosis: pH 7.503, decreased CO2 33.2, increased O2 437, increased bicarb 26.1. -Blood cultures x 2 -Continue cefepime and vancomycin per pharmacy -Consider repeat CXR in am -Trend troponins -Trend lactic acid -Repeat EKG in am -Repeat CBC/BMP -Cardiac monitoring -NPO sips with meds  AMS: Patient has baseline mental retardation, non-verbal, but grunts. He was less responsive in HD, not sitting up, not grunting, not tracking with eyes. Most likely 2/2 infection vs HoTN. Mostly resolved. [name] stated he was back at baseline in ED, grunting, tracking with eye and following basic commands. -Neuro checks -Hold Klonopin for now  Acquired Adrenal Insufficiency: Patient has known h/o adrenal insufficiency, on Midodrine 15 mg tid at home. Recently on a Prednisone 10 day taper. He had 10 mg per day for 3 days then 5 mg for the rest. Given Solu-Cortef 100 IV once. Will likely need to be continued on stress-dosed steroids to maintain BP. Hypotension likely contributed to  by  adrenal insufficiency. -Consider starting Solu-Cortef 50 mg IV Q6H tomorrow  ESRD on HD: Just dialyzed on 05/02/14. Has a new HD catheter in right femoral. Patient pulled on previous catheter, changed by Dr. Hart Rochester on 04/29/14. Cr 5.44. At baseline. -I/Os -TTS HD  -Continue Cinacalcet, Dialyvite, Renvela  Hypotension: BP is 97/60, lowest recorded today was 65/46. He was tachycardic to the 110s-120s. Pt was given NS 1.5 L bolus. Likely due to acquired adrenal insufficiency v acute shock. On midodrine 15 mg TID.  -Previous inpatient goal was SBP > 75, consider fluids and/or pressors if hypotensive and symptomatic below baseline  -Continue midodrine 15 mg TID   Anemia: Patient's level of engagement suggests that he is tolerating the anemia okay. Most recent iron panel on 7/715 showed Iron 16, TIBC 189, Saturation 8, ferritin 176. Iron level low end of normal and TIBC is low but ferritin normal which can be due to anemia of chronic disease related to his kidneys.  -continue to monitor   History of seizures: No recent seizures. Unlikely that acute mental status change was a result of seizure since pt has respiratory distress and hypotension. Pt back at mental status baseline. On phenytoin 200 mg daily at home. Dilantin level was undetectable in ED.  -Received phenytoin 1 gram IV bolus -Hold clonazepam for now due to altered mental status, can restart if AMS is resolved -Seizure precautions -Neuro checks  Mental retardation: Patient appears back at baseline from chart review and per family. Non-verbal, but grunts. He maintained eye contact between Dr. Yetta Barre and me. Followed basic commands such as opening mouth. -Neuro checks  DVT/PE ppx: SCD's. Avoid heparin d/t R femoral HD cath leak, recent surgery, questionable history of GI bleed in December 2014, and h/o anemia.  Dispo: Disposition is deferred at this time, awaiting improvement of current medical problems. Anticipated discharge in  approximately 2-3 day(s).  The patient does have a current PCP Courtney Paris, MD) and does not need an [facility] hospital follow-up appointment after discharge.  The patient does not have transportation limitations that hinder transportation to clinic appointments.  Signed: Jill Alexanders, DO PGY-1 Internal Medicine Resident Pager # [phone] 05/02/2014 10:44 PM

## 2014-05-02 NOTE — Progress Notes (Signed)
Pt taken off Bipap per MD request & placed on 2lpm. Pt is mute. Vitals remain the same as noted on & off Bipap. Sister at beside. RN in room.

## 2014-05-02 NOTE — ED Notes (Signed)
Report given to Huntley DecSara, RN unit 2C.

## 2014-05-03 ENCOUNTER — Inpatient Hospital Stay (HOSPITAL_COMMUNITY): Payer: Medicare Other

## 2014-05-03 ENCOUNTER — Encounter (HOSPITAL_COMMUNITY): Payer: Self-pay

## 2014-05-03 ENCOUNTER — Ambulatory Visit: Payer: Medicare Other | Admitting: Internal Medicine

## 2014-05-03 DIAGNOSIS — A419 Sepsis, unspecified organism: Secondary | ICD-10-CM

## 2014-05-03 LAB — BASIC METABOLIC PANEL
Anion gap: 22 — ABNORMAL HIGH (ref 5–15)
Anion gap: 20 — ABNORMAL HIGH (ref 5–15)
Anion gap: 18 — ABNORMAL HIGH (ref 5–15)
BUN: 23 mg/dL (ref 6–23)
BUN: 34 mg/dL — AB (ref 6–23)
BUN: 41 mg/dL — ABNORMAL HIGH (ref 6–23)
CALCIUM: 7.4 mg/dL — AB (ref 8.4–10.5)
CALCIUM: 7.3 mg/dL — AB (ref 8.4–10.5)
CHLORIDE: 101 meq/L (ref 96–112)
CO2: 21 mEq/L (ref 19–32)
CO2: 21 mEq/L (ref 19–32)
CO2: 22 mEq/L (ref 19–32)
CREATININE: 6.63 mg/dL — AB (ref 0.50–1.35)
CREATININE: 8.17 mg/dL — AB (ref 0.50–1.35)
Calcium: 7.5 mg/dL — ABNORMAL LOW (ref 8.4–10.5)
Chloride: 99 mEq/L (ref 96–112)
Chloride: 99 mEq/L (ref 96–112)
Creatinine, Ser: 7.74 mg/dL — ABNORMAL HIGH (ref 0.50–1.35)
GFR calc Af Amer: 8 mL/min — ABNORMAL LOW (ref >90)
GFR calc Af Amer: 7 mL/min — ABNORMAL LOW (ref >90)
GFR calc non Af Amer: 8 mL/min — ABNORMAL LOW (ref >90)
GFR calc non Af Amer: 6 mL/min — ABNORMAL LOW (ref >90)
GFR, EST AFRICAN AMERICAN: 9 mL/min — AB (ref >90)
GFR, EST NON AFRICAN AMERICAN: 6 mL/min — AB (ref >90)
GLUCOSE: 166 mg/dL — AB (ref 70–99)
Glucose, Bld: 145 mg/dL — ABNORMAL HIGH (ref 70–99)
Glucose, Bld: 210 mg/dL — ABNORMAL HIGH (ref 70–99)
Potassium: 5.4 mEq/L — ABNORMAL HIGH (ref 3.7–5.3)
Potassium: 6.2 mEq/L — ABNORMAL HIGH (ref 3.7–5.3)
Potassium: 5.8 mEq/L — ABNORMAL HIGH (ref 3.7–5.3)
Sodium: 142 mEq/L (ref 137–147)
Sodium: 140 mEq/L (ref 137–147)
Sodium: 141 mEq/L (ref 137–147)

## 2014-05-03 LAB — DIFFERENTIAL
Basophils Absolute: 0 10*3/uL (ref 0.0–0.1)
Basophils Relative: 0 % (ref 0–1)
Eosinophils Absolute: 0 10*3/uL (ref 0.0–0.7)
Eosinophils Relative: 0 % (ref 0–5)
LYMPHS PCT: 1 % — AB (ref 12–46)
Lymphs Abs: 0.2 10*3/uL — ABNORMAL LOW (ref 0.7–4.0)
MONOS PCT: 1 % — AB (ref 3–12)
Monocytes Absolute: 0.1 10*3/uL (ref 0.1–1.0)
Neutro Abs: 11 10*3/uL — ABNORMAL HIGH (ref 1.7–7.7)
Neutrophils Relative %: 98 % — ABNORMAL HIGH (ref 43–77)

## 2014-05-03 LAB — GLUCOSE, CAPILLARY
GLUCOSE-CAPILLARY: 207 mg/dL — AB (ref 70–99)
GLUCOSE-CAPILLARY: 130 mg/dL — AB (ref 70–99)
GLUCOSE-CAPILLARY: 155 mg/dL — AB (ref 70–99)
Glucose-Capillary: 169 mg/dL — ABNORMAL HIGH (ref 70–99)
Glucose-Capillary: 199 mg/dL — ABNORMAL HIGH (ref 70–99)

## 2014-05-03 LAB — CBC
HCT: 29.9 % — ABNORMAL LOW (ref 39.0–52.0)
Hemoglobin: 9.2 g/dL — ABNORMAL LOW (ref 13.0–17.0)
MCH: 29.8 pg (ref 26.0–34.0)
MCHC: 30.8 g/dL (ref 30.0–36.0)
MCV: 96.8 fL (ref 78.0–100.0)
Platelets: 102 10*3/uL — ABNORMAL LOW (ref 150–400)
RBC: 3.09 MIL/uL — ABNORMAL LOW (ref 4.22–5.81)
RDW: 27.8 % — ABNORMAL HIGH (ref 11.5–15.5)
WBC: 11.6 10*3/uL — ABNORMAL HIGH (ref 4.0–10.5)

## 2014-05-03 LAB — TROPONIN I
TROPONIN I: 0.79 ng/mL — AB (ref <0.30)
Troponin I: 0.88 ng/mL (ref <0.30)
Troponin I: 1.1 ng/mL (ref <0.30)

## 2014-05-03 LAB — PROCALCITONIN: Procalcitonin: >175 ng/mL

## 2014-05-03 LAB — LACTIC ACID, PLASMA
LACTIC ACID, VENOUS: 5.3 mmol/L — AB (ref 0.5–2.2)
Lactic Acid, Venous: 4.1 mmol/L — ABNORMAL HIGH (ref 0.5–2.2)

## 2014-05-03 MED ORDER — HYDROCORTISONE NA SUCCINATE PF 100 MG IJ SOLR
100.0000 mg | Freq: Once | INTRAMUSCULAR | Status: AC
  Administered 2014-05-03: 100 mg via INTRAVENOUS
  Filled 2014-05-03: qty 2

## 2014-05-03 MED ORDER — ACETAMINOPHEN 325 MG PO TABS
650.0000 mg | ORAL_TABLET | Freq: Four times a day (QID) | ORAL | Status: DC | PRN
  Filled 2014-05-03 (×2): qty 2

## 2014-05-03 MED ORDER — SODIUM CHLORIDE 0.9 % IV BOLUS (SEPSIS)
250.0000 mL | Freq: Once | INTRAVENOUS | Status: AC
  Administered 2014-05-03: 250 mL via INTRAVENOUS

## 2014-05-03 MED ORDER — DOXERCALCIFEROL 4 MCG/2ML IV SOLN
10.0000 ug | INTRAVENOUS | Status: DC
  Administered 2014-05-06 – 2014-05-11 (×2): 10 ug via INTRAVENOUS
  Filled 2014-05-03 (×4): qty 6

## 2014-05-03 MED ORDER — ACETAMINOPHEN 650 MG RE SUPP
650.0000 mg | Freq: Four times a day (QID) | RECTAL | Status: DC | PRN
  Administered 2014-05-03: 650 mg via RECTAL
  Filled 2014-05-03: qty 1

## 2014-05-03 MED ORDER — HYDROCORTISONE NA SUCCINATE PF 100 MG IJ SOLR
50.0000 mg | Freq: Four times a day (QID) | INTRAMUSCULAR | Status: DC
  Administered 2014-05-03 – 2014-05-05 (×8): 50 mg via INTRAVENOUS
  Filled 2014-05-03 (×12): qty 1

## 2014-05-03 MED ORDER — ALBUTEROL SULFATE (2.5 MG/3ML) 0.083% IN NEBU
10.0000 mg | INHALATION_SOLUTION | Freq: Once | RESPIRATORY_TRACT | Status: AC
  Administered 2014-05-03: 10 mg via RESPIRATORY_TRACT
  Filled 2014-05-03: qty 12

## 2014-05-03 MED ORDER — DARBEPOETIN ALFA-POLYSORBATE 100 MCG/0.5ML IJ SOLN
100.0000 ug | INTRAMUSCULAR | Status: DC
  Administered 2014-05-11: 100 ug via INTRAVENOUS
  Filled 2014-05-03 (×2): qty 0.5

## 2014-05-03 NOTE — Consult Note (Signed)
Indication for Consultation:  Management of ESRD/hemodialysis; anemia, hypertension/volume and secondary hyperparathyroidism  HPI: [name] Clackeal W [name] is a 63 y.o. male who was sent to the ED yesterday from his HD center when he had an acute change in mental status and hypotension during treatment. He receives HD TTS @ [facility]. History of mental retardation, seizures, CHF. He had his R femoral HD cath replaced 7/11 after pt partially pulled it out. It appears he was at his baseline until yesterday at HD. Will schedule for HD tomorrow.   Past Medical History  Diagnosis Date  . Heart murmur, systolic 08/10/2009  . Syncope 05/07/2009  . 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 Esophageal varices 11/07/2008  . Gastric ulcer 10/09    antral, with h pylori positive  . Congestive heart failure 03/06/2008  . Cellulitis and abscess of leg, except foot 03/06/2008  . Secondary hyperparathyroidism 02/02/2008  . Mute 02/02/2008  . Hyperlipidemia 02/02/2008  . Anemia 02/02/2008  . ESRD (end stage renal disease)     TTS hemodialysis  . GERD (gastroesophageal reflux disease)   . Hypertension 02/02/2008    in history  . Mental retardation 02/02/2008  . Mute   . Seizures     "non in a while at home". none in past year .  Marland Kitchen. Complication of anesthesia     in the past BP has dropped    Past Surgical History  Procedure Laterality Date  . Left forearm graft      for HD  . Arteriovenous graft placement  11/22/10    Right thigh AVG  . Thrombectomy and revision of arterioventous (av) goretex  graft    . Thrombectomy and revision of arterioventous (av) goretex  graft  10/22/2012    Procedure: THROMBECTOMY AND REVISION OF ARTERIOVENTOUS (AV) GORETEX  GRAFT;  Surgeon: Larina Earthlyodd F Early, MD;  [facility];  Service: Vascular;  Laterality: Right;  . Thrombectomy w/ embolectomy  11/10/2012    Procedure: THROMBECTOMY ARTERIOVENOUS GORE-TEX GRAFT;  Surgeon: Pryor OchoaJames D Lawson, MD;  [facility];  Service: Vascular;   Laterality: Right;  . Thrombectomy and revision of arterioventous (av) goretex  graft Right 12/08/2012    Procedure: THROMBECTOMY AND REVISION OF ARTERIOVENTOUS (AV) GORETEX  GRAFT right thigh;  Surgeon: Sherren Kernsharles E Fields, MD;  [facility];  Service: Vascular;  Laterality: Right;  Susie Cassette. Venogram N/A 12/08/2012    Procedure: VENOGRAM;  Surgeon: Sherren Kernsharles E Fields, MD;  [facility];  Service: Vascular;  Laterality: N/A;  Intraoperative Central venogram  . Thrombectomy w/ embolectomy Right 12/12/2012    Procedure: THROMBECTOMY ARTERIOVENOUS GORE-TEX GRAFT;  Surgeon: Nada LibmanVance W Brabham, MD;  [facility];  Service: Vascular;  Laterality: Right;  . Insertion of dialysis catheter Left 12/14/2012    Procedure: INSERTION OF DIALYSIS CATHETER;  Surgeon: Nada LibmanVance W Brabham, MD;  [facility];  Service: Vascular;  Laterality: Left;  . Insertion of dialysis catheter Right 01/13/2013    Procedure: INSERTION OF DIALYSIS CATHETER;  Surgeon: Nada LibmanVance W Brabham, MD;  [facility];  Service: Vascular;  Laterality: Right;  . Removal of a dialysis catheter Left 01/13/2013    Procedure: REMOVAL OF A DIALYSIS CATHETER;  Surgeon: Nada LibmanVance W Brabham, MD;  [facility] OR;  Service: Vascular;  Laterality: Left;  . Av fistula placement Left 02/11/2013    Procedure: INSERTION OF ARTERIOVENOUS (AV) GORE-TEX GRAFT THIGH;  Surgeon: Nada LibmanVance W Brabham, MD;  Location: [facility] OR;  Service: Vascular;  Laterality: Left;  using 6mm x 50cm  Gore-Tex Vascular Graft  . Esophagogastroduodenoscopy N/A 02/16/2013    Procedure: ESOPHAGOGASTRODUODENOSCOPY (EGD);  Surgeon: Vertell Novak., MD;  Location[facility] ENDOSCOPY;  Service: Endoscopy;  Laterality: N/A;  bedside  . Esophagogastroduodenoscopy N/A 09/14/2013    Procedure: ESOPHAGOGASTRODUODENOSCOPY (EGD);  Surgeon: Vertell Novak., MD;  Location: [facility] ENDOSCOPY;  Service: Endoscopy;  Laterality: N/A;  control of bleeding if needed  . Esophagogastroduodenoscopy N/A 10/03/2013    Procedure:  ESOPHAGOGASTRODUODENOSCOPY (EGD);  Surgeon: Theda Belfast, MD;  Location: [facility] ENDOSCOPY;  Service: Endoscopy;  Laterality: N/A;  Bedside  . Radiology with anesthesia Right 10/19/2013    Procedure: RADIOLOGY WITH ANESTHESIA;  Surgeon: Malachy Moan, MD;  [facility];  Service: Radiology;  Laterality: Right;  . Radiology with anesthesia Left 12/28/2013    Procedure: RADIOLOGY WITH ANESTHESIA;  Surgeon: Reola Calkins, MD;  Location: [facility] OR;  Service: Radiology;  Laterality: Left;  . Thrombectomy and revision of arterioventous (av) goretex  graft Left 01/25/2014    Procedure: THROMBECTOMY AND REVISION OF LEFT THIGH ARTERIOVENTOUS (AV) GORETEX  GRAFT WITH PATCH ANGIOPLASTY;  Surgeon: Pryor Ochoa, MD;  Location: [facility] OR;  Service: Vascular;  Laterality: Left;  . Thrombectomy and revision of arterioventous (av) goretex  graft Left 04/10/2014    Procedure: THROMBECTOMY AND REVISION OF ARTERIOVENTOUS (AV) GORETEX  GRAFT;  Surgeon: Sherren Kerns, MD;  Location: [facility] OR;  Service: Vascular;  Laterality: Left;  . Patch angioplasty Left 04/10/2014    Procedure: PATCH ANGIOPLASTY LEFT SFA;  Surgeon: Sherren Kerns, MD;  Location: [facility] OR;  Service: Vascular;  Laterality: Left;  . Insertion of dialysis catheter Bilateral 04/11/2014    Procedure: INSERTION OF DIALYSIS CATHETER RIGHT FEMORAL VEIN; INSERTION OF TRIPLE LUMEN LEFT FEMORAL VEIN CENTRAL LINE; REMOVAL OF DIALYSIS CATHETER IN RIGHT FEMORAL VEIN.;  Surgeon: Nada Libman, MD;  [facility] OR;  Service: Vascular;  Laterality: Bilateral;  . Exchange of a dialysis catheter Right 04/29/2014    Procedure: EXCHANGE OF A  FEMORAL DIALYSIS CATHETER;  Surgeon: Pryor Ochoa, MD;  Location[facility] OR;  Service: Vascular;  Laterality: Right;   History reviewed. No pertinent family history. Social History:  reports that he has never smoked. He has never used smokeless tobacco. He reports that he does not drink alcohol or use illicit drugs. No Known  Allergies Prior to Admission medications   Medication Sig Start Date End Date Taking? Authorizing Provider  atorvastatin (LIPITOR) 20 MG tablet Take 20 mg by mouth at bedtime.    Yes Historical Provider, MD  cinacalcet (SENSIPAR) 30 MG tablet Take 30 mg by mouth daily with breakfast.    Yes Historical Provider, MD  clonazePAM (KLONOPIN) 0.5 MG tablet Take 0.25 mg by mouth 2 (two) times daily.   Yes Historical Provider, MD  folic acid-vitamin b complex-vitamin c-selenium-zinc (DIALYVITE) 3 MG TABS tablet Take 1 tablet by mouth daily.   Yes Historical Provider, MD  midodrine (PROAMATINE) 10 MG tablet Take 15 mg by mouth 3 (three) times daily.    Yes Historical Provider, MD  pantoprazole (PROTONIX) 40 MG tablet Take 40 mg by mouth daily.   Yes Historical Provider, MD  phenytoin (DILANTIN) 100 MG ER capsule Take 200 mg by mouth at bedtime.   Yes Historical Provider, MD  sevelamer carbonate (RENVELA) 800 MG tablet Take 800 mg by mouth 3 (three) times daily with meals.   Yes Historical Provider, MD   Current Facility-Administered Medications  Medication Dose Route Frequency Provider Last Rate Last  Dose  . acetaminophen (TYLENOL) suppository 650 mg  650 mg Rectal Q6H PRN Lorenda Hatchet, MD   650 mg at 05/03/14 4098   Or  . acetaminophen (TYLENOL) tablet 650 mg  650 mg Oral Q6H PRN Lorenda Hatchet, MD      . ceFEPIme (MAXIPIME) 2 g in dextrose 5 % 50 mL IVPB  2 g Intravenous Once Hessie Diener [place]      . [START ON 05/04/2014] ceFEPIme (MAXIPIME) 2 g in dextrose 5 % 50 mL IVPB  2 g Intravenous Q T,Th,Sa-HD Fredrik Rigger, [facility]      . cinacalcet (SENSIPAR) tablet 30 mg  30 mg Oral BID WC Courtney Paris, MD      . hydrocortisone sodium succinate (SOLU-CORTEF) 100 MG injection 50 mg  50 mg Intravenous Q6H Courtney Paris, MD   50 mg at 05/03/14 1100  . insulin aspart (novoLOG) injection 0-9 Units  0-9 Units Subcutaneous 6 times per day Courtney Paris, MD   3 Units at 05/03/14 0844  . midodrine  (PROAMATINE) tablet 15 mg  15 mg Oral TID Courtney Paris, MD   15 mg at 05/03/14 1058  . multivitamin (RENA-VIT) tablet 1 tablet  1 tablet Oral QHS Levert Feinstein, MD      . pantoprazole (PROTONIX) EC tablet 40 mg  40 mg Oral Daily Courtney Paris, MD      . phenytoin (DILANTIN) ER capsule 200 mg  200 mg Oral QHS Courtney Paris, MD      . sevelamer carbonate (RENVELA) tablet 800 mg  800 mg Oral TID WC Courtney Paris, MD      . sodium chloride 0.9 % injection 3 mL  3 mL Intravenous Q12H Courtney Paris, MD      . Melene Muller ON 05/04/2014] vancomycin (VANCOCIN) 500 mg in sodium chloride 0.9 % 100 mL IVPB  500 mg Intravenous Q T,Th,Sa-HD Hessie Diener [place], [facility]       Labs: Basic Metabolic Panel:  Recent Labs Lab 04/29/14 1850 05/02/14 1639 05/03/14 0107  NA 142 143 142  K 3.7 3.7 5.4*  CL 99 99 99  CO2 27 22 21   GLUCOSE 90 112* 145*  BUN 7 18 23   CREATININE 3.16* 5.44* 6.63*  CALCIUM 8.1* 7.5* 7.5*   Liver Function Tests:  Recent Labs Lab 04/29/14 1850 05/02/14 1639  AST 35 28  ALT 34 <5  ALKPHOS 144* 125*  BILITOT 0.3 0.8  PROT 6.9 6.2  ALBUMIN 3.5 3.0*   No results found for this basename: LIPASE, AMYLASE,  in the last 168 hours  Recent Labs Lab 05/02/14 1639  AMMONIA 40   CBC:  Recent Labs Lab 04/29/14 1850 05/02/14 1639 05/03/14 0107 05/03/14 0230  WBC 7.1 3.2* 11.6*  --   NEUTROABS 5.0  --   --  11.0*  HGB 9.4* 8.8* 9.2*  --   HCT 30.2* 28.5* 29.9*  --   MCV 94.4 94.7 96.8  --   PLT 132* 101* 102*  --    Cardiac Enzymes:  Recent Labs Lab 05/03/14 0107 05/03/14 0908  TROPONINI 0.88* 0.79*   CBG:  Recent Labs Lab 04/27/14 1711 04/29/14 2210 05/03/14 0508 05/03/14 0744 05/03/14 1219  GLUCAP 166* 91 169* 207* 199*   Iron Studies: No results found for this basename: IRON, TIBC, TRANSFERRIN, FERRITIN,  in the last 72 hours Studies/Results: Dg Chest Port 1 View  05/03/2014   CLINICAL DATA:  Shortness of breath  and fever  EXAM: PORTABLE CHEST - 1 VIEW   COMPARISON:  05/02/2014  FINDINGS: No cardiomegaly. Unchanged mediastinal contours. There is no edema, consolidation, effusion, or pneumothorax. Dialysis catheter from below, tip at the level of the infrahepatic cava.  IMPRESSION: No active disease.   Electronically Signed   By: Tiburcio Pea M.D.   On: 05/03/2014 03:26   Dg Chest Portable 1 View  05/02/2014   CLINICAL DATA:  Altered mental status ; history of heart murmur and CHF and hypertension  EXAM: PORTABLE CHEST - 1 VIEW  COMPARISON:  Portable chest x-ray of April 29, 2014  FINDINGS: The lungs are mildly hypoinflated. There is no focal infiltrate. The heart and pulmonary vascularity are unremarkable. There is no pleural effusion. There is moderate gaseous distention of the stomach. The bony thorax is unremarkable.  IMPRESSION: There is bilateral pulmonary hypoinflation but no evidence of acute cardiopulmonary abnormality.   Electronically Signed   By: David  [place]   On: 05/02/2014 16:55     Review of Systems: Unable to obtain, Pt is nonverbal.   Physical Exam: Filed Vitals:   05/03/14 0630 05/03/14 0800 05/03/14 1045 05/03/14 1200  BP: 79/35 74/35  84/43  Pulse: 105 102  92  Temp:  102.8 F (39.3 C) 99.6 F (37.6 C) 98.4 F (36.9 C)  TempSrc:  Oral Oral Oral  Resp:  28  20  Height:      Weight:      SpO2: 100% 97%  99%     General: Well developed, well nourished, in no acute distress. Non verbal. makes eyecontact Head: Normocephalic, atraumatic, sclera non-icteric, mucus membranes are moist Neck: Supple. JVD not elevated. Lungs: Clear, dim. Breathing is unlabored. Heart: RRR 2/6 SEM Abdomen: Soft, non-tender, non-distended with normoactive bowel sounds. No rebound/guarding. No obvious abdominal masses. M-S:  Strength and tone appear normal for age. Lower extremities:without edema or ischemic changes, no open wounds  Neuro: nonverbal. Is not following commands. Dialysis Access: R femoral cath replaced 7/11 Small amout of  seropurulent drainage expressible from R thigh HD cath site ; L thigh wounds from recent AVG removal have healed over  Dialysis Orders: TTS @ [facility] 3hr   56kgs    160  400/1.5   2k/2ca 3500 R fem cath hectorol 11 mcg IV/HD Arenesp 100 q week  Venofer      Assessment/Plan: 1. Sepsis- Tmax 102.8, now 98.4. Blood cultures pending. Vanc and cefepime per pharmacy. Seen by [facility]- no obvious infection at cath site.  2. [facility]. K+ 5.4 on admission, watch 3. Hypertension/volume  - hypotension 84/43, s/p IVF bolus. Midodrine. solucortef for adrenal insufficiency.  4. Anemia  - hgb 9.2. Cont ESA  5. Metabolic bone disease -  Ca+ 7.5. Continue hectorol, sensipar and renvela.  6. Nutrition - alb 3, NPO. multivit 7. Seizures- given phenytoin bolus.   [name], NP [facility] [phone] 05/03/2014, 12:57 PM   Pt seen, examined, agree w assess/plan as above with additions as indicated. ESRD patient w MR, mute, ESRD.  Here recently and dx'd with adrenal insufficiency and is now on hydrocortisone. Also is end-stage access with recently failed L thigh AVG and current R femoral Diatek which is purportedly his last access. Was made DNR last admit. I have copied the palliative care note from Dr Phillips Odor (see below). Note that goal is for minimally invasive procedures only and to avoid ICU care.  Suspect this is catheter-related sepsis.  Agree w current management, IV abx, supportive care, f/u BC results.  HD tomorrow if stable enough.   Vinson Moselle MD pager [phone]    cell [phone] 05/03/2014, 5:17 PM  From 04/24/14 Palliative Care Consult by Dr Phillips Odor >>  Prognosis: Generally Very Poor- he has terminal HD access- at best this catheter will last for a few months even with the most meticulous care. I prepared them for a variety of possibilities including a rapid decline here in the hospital if this Hb drop is related to ACB or something more  ominous-other than that I am not certain that he is dying immanently or actively.  Palliative Performance Scale: 30%  Recommendations:  1. Code Status: NOW DNR, golden rod completed and should go with patient 2. Scope of Treatment/Goals: Family recognize that this is VERY serious, but the also thought he was doing much better on Saturday. They do not want him to suffer-they want pain and discomfort managed. They are also very worried about his functional status and ability to be able to walk again and what that will include. They desire minimally invasive interventions-no major procedures and avoid ICU care. They are agreeable to short term transfusion with HD here in the hospital and minimally invasive work up of anemia. Treat Reasonably Reversible Illness ex. an underlying infection (work up of elevated WBC count) I provided education on Hospice Care and that once his catheter stops working this should be part of his EOL care-they were open to this and understood role of hospice at the point it becomes clear that Kielan is actively dying. We need to determine if outpatient HD is even an option- can he sit in a chair? Need PT assessment as well. 3. Symptom Management: PRN Fentanyl IV/Penryn or TD in HD patients is recommended.  Comfort feeding as tolerated. Do not restrict diet or put him through extensive swallow evaluations. Use aspiration precautions.  Ativan for seizure and sleep PRN 4. Palliative Prophylaxis: Need regular Bowel Regimen 5. Disposition: To be determined-I doubt a group home will be able to manage his care and transport him to HD in this condition but will need to see what he can do with PT- he has been essentially bed bound since admission on 6/22.

## 2014-05-03 NOTE — Progress Notes (Signed)
05/03/2014 [name] Karvon[facility]John [name] [facility] called at 1800 blood culture the aerobic bottle was growing gram negative rods. Dr Leatha GildingMallory from internal medicine is aware. [facility]Stefana Lodico RN.

## 2014-05-03 NOTE — Progress Notes (Addendum)
  [facility] Progress Note  05/03/2014 12:15 PM   Subjective:  Does not speak--very cooperative with exam  Tm 102.8-now 99.6  Filed Vitals:   05/03/14 1045  BP:   Pulse:   Temp: 99.6 F (37.6 C)  Resp:     Physical Exam: Incisions:  Right groin inspected and there is no purulence and no obvious infection of right groin.   CBC    Component Value Date/Time   WBC 11.6* 05/03/2014 0107   RBC 3.09* 05/03/2014 0107   RBC 2.30* 04/17/2014 0800   HGB 9.2* 05/03/2014 0107   HCT 29.9* 05/03/2014 0107   PLT 102* 05/03/2014 0107   MCV 96.8 05/03/2014 0107   MCH 29.8 05/03/2014 0107   MCHC 30.8 05/03/2014 0107   RDW 27.8* 05/03/2014 0107   LYMPHSABS 0.2* 05/03/2014 0230   MONOABS 0.1 05/03/2014 0230   EOSABS 0 05/03/2014 0230   BASOSABS 0 05/03/2014 0230    BMET    Component Value Date/Time   NA 142 05/03/2014 0107   K 5.4* 05/03/2014 0107   CL 99 05/03/2014 0107   CO2 21 05/03/2014 0107   GLUCOSE 145* 05/03/2014 0107   BUN 23 05/03/2014 0107   CREATININE 6.63* 05/03/2014 0107   CALCIUM 7.5* 05/03/2014 0107   GFRNONAA 8* 05/03/2014 0107   GFRAA 9* 05/03/2014 0107    INR    Component Value Date/Time   INR 1.08 04/29/2014 1850     Intake/Output Summary (Last 24 hours) at 05/03/14 1215 Last data filed at 05/03/14 0220  Gross per 24 hour  Intake   2020 ml  Output      0 ml  Net   2020 ml     Assessment:  63 y.o. male is ESRD with no further options for access  Plan: No obvious infection of right groin.  There is no purulence in the right groin. -paint daily with betadine in right groin and around diatek catheter.  -recommend blood cx be drawn from catheter -would only remove diatek as last resort    Doreatha MassedSamantha Rhyne, PA-C [facility] [phone] 05/03/2014 12:15 PM  Agree with above I replaced the right femoral dialysis catheter on July 11 at the patient pulled it out so cuff was exposed and-catheter did not frankly purulent at that  time New catheter is tunneled more medially Wounds do not appear overtly infected and would not allow catheter unless cultures through catheter are positive or other further evidence of infection

## 2014-05-03 NOTE — Progress Notes (Signed)
Utilization Review Completed.  

## 2014-05-03 NOTE — Progress Notes (Signed)
Patient continued to be hypotensive into the 60's systolic overnight. Now s/p ~2L NS. Repeat CXR not suggestive of volume overload, still w/ SpO2 of 100% on 2L via [place]. Does appear slightly tachypneic on exam. Still lethargic, but makes eye contact. Of note, repeat lactic acid of 5.3 @ 1:00 AM, slightly improved from admission. Also w/ mild elevated troponin of 0.88. Repeat EKG w/ no significant ST/T wave changes. Also has a leukocytosis of 11.6 (w/ left shift), increased from 3.2 on admission.  -Discussed w/ [facility], will given another dose of Solu-Cortef 100 mg IV, then continue 50 mg IV q6h -Continue to trend lactic acid, troponins in AM

## 2014-05-03 NOTE — Consult Note (Signed)
PULMONARY / CRITICAL CARE MEDICINE   Name: [name] MRN: [id number] DOB: September 06, 1951    ADMISSION DATE:  05/02/2014 CONSULTATION DATE:  05/03/2014  REFERRING MD :  Dr. Cyndie Chime PRIMARY SERVICE: IMTS  CHIEF COMPLAINT:  Sepsis  BRIEF PATIENT DESCRIPTION: 63 yo male. He is nonverbal secondary to MR, ESRD with TTS HD, current AV fistula is terminal access. 7/14 Presented to ED after an acute change in mental status as dialysis center. He was hypotensive and did not respond well to IVF resuscitation. Suspected sepsis with permacath site source. 7/15 remained hypotensive, PCCM contacted for potential use of vasoactive medications.   SIGNIFICANT EVENTS / STUDIES:  7/11 HD catheter changed 7/14 admitted from dialysis with AMS and presumed sepsis.   LINES / TUBES: HD cath R groin 7/11 >>> PIV  CULTURES: Blood 7/14 >>>  ANTIBIOTICS: Cefepime 7/14 >>> Vancomycin 7/14 >>>  HISTORY OF PRESENT ILLNESS:  63 year old male mentally handicapped (non-verbal at baseline) male with PMH as below, which includes ESRD on HD. He has had increasing problems with vascular access. He suffered a hemorrhagic complication at time of a revision procedure in June. He required blood transfusion and temporary stabilization in the intensive care unit setting. He is chronically hypotensive and is on midodrine. He currently has a right femoral dialysis catheter in place, which is reportedly the last site the family wishes to have accessed. Post d/c he was manipulating the catheter which was partially retracted. It had to be replaced on July 11. He presented 7/14 with a change in his baseline mental status. Blood pressure lower than his baseline and tachycardia noted at time of dialysis and he is admitted for further evaluation. Although he did not have a fever initially, temperature has subsequently risen to 102.8. Lactic acid elevated at 5.7.  Treatment has been initiated with high-volume fluids and parenteral  antibiotics and he was given stress dose steroids. He remains hypotensive. PCCM has been asked to consult for possible use of vasopressors. Family has been contacted and is okay with using vasoactive medications if that is the best course of treatment.    PAST MEDICAL HISTORY :  Past Medical History  Diagnosis Date  . Heart murmur, systolic 08/10/2009  . Syncope 05/07/2009  . 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 Esophageal varices 11/07/2008  . Gastric ulcer 10/09    antral, with h pylori positive  . Congestive heart failure 03/06/2008  . Cellulitis and abscess of leg, except foot 03/06/2008  . Secondary hyperparathyroidism 02/02/2008  . Mute 02/02/2008  . Hyperlipidemia 02/02/2008  . Anemia 02/02/2008  . ESRD (end stage renal disease)     TTS hemodialysis  . GERD (gastroesophageal reflux disease)   . Hypertension 02/02/2008    in history  . Mental retardation 02/02/2008  . Mute   . Seizures     "non in a while at home". none in past year .  Marland Kitchen Complication of anesthesia     in the past BP has dropped    Past Surgical History  Procedure Laterality Date  . Left forearm graft      for HD  . Arteriovenous graft placement  11/22/10    Right thigh AVG  . Thrombectomy and revision of arterioventous (av) goretex  graft    . Thrombectomy and revision of arterioventous (av) goretex  graft  10/22/2012    Procedure: THROMBECTOMY AND REVISION OF ARTERIOVENTOUS (AV) GORETEX  GRAFT;  Surgeon: Larina Earthly, MD;  Location: [facility] OR;  Service: Vascular;  Laterality: Right;  . Thrombectomy w/ embolectomy  11/10/2012    Procedure: THROMBECTOMY ARTERIOVENOUS GORE-TEX GRAFT;  Surgeon: Pryor OchoaJames D Lawson, MD;  [facility];  Service: Vascular;  Laterality: Right;  . Thrombectomy and revision of arterioventous (av) goretex  graft Right 12/08/2012    Procedure: THROMBECTOMY AND REVISION OF ARTERIOVENTOUS (AV) GORETEX  GRAFT right thigh;  Surgeon: Sherren Kernsharles E Fields, MD;  [facility];  Service:  Vascular;  Laterality: Right;  Susie Cassette. Venogram N/A 12/08/2012    Procedure: VENOGRAM;  Surgeon: Sherren Kernsharles E Fields, MD;  [facility];  Service: Vascular;  Laterality: N/A;  Intraoperative Central venogram  . Thrombectomy w/ embolectomy Right 12/12/2012    Procedure: THROMBECTOMY ARTERIOVENOUS GORE-TEX GRAFT;  Surgeon: Nada LibmanVance W Brabham, MD;  [facility];  Service: Vascular;  Laterality: Right;  . Insertion of dialysis catheter Left 12/14/2012    Procedure: INSERTION OF DIALYSIS CATHETER;  Surgeon: Nada LibmanVance W Brabham, MD;  [facility];  Service: Vascular;  Laterality: Left;  . Insertion of dialysis catheter Right 01/13/2013    Procedure: INSERTION OF DIALYSIS CATHETER;  Surgeon: Nada LibmanVance W Brabham, MD;  [facility];  Service: Vascular;  Laterality: Right;  . Removal of a dialysis catheter Left 01/13/2013    Procedure: REMOVAL OF A DIALYSIS CATHETER;  Surgeon: Nada LibmanVance W Brabham, MD;  [facility]: [facility] OR;  Service: Vascular;  Laterality: Left;  . Av fistula placement Left 02/11/2013    Procedure: INSERTION OF ARTERIOVENOUS (AV) GORE-TEX GRAFT THIGH;  Surgeon: Nada LibmanVance W Brabham, MD;  Location: [facility] OR;  Service: Vascular;  Laterality: Left;  using 6mm x 50cm Gore-Tex Vascular Graft  . Esophagogastroduodenoscopy N/A 02/16/2013    Procedure: ESOPHAGOGASTRODUODENOSCOPY (EGD);  Surgeon: Vertell NovakJames L Edwards Jr., MD;  [facility];  Service: Endoscopy;  Laterality: N/A;  bedside  . Esophagogastroduodenoscopy N/A 09/14/2013    Procedure: ESOPHAGOGASTRODUODENOSCOPY (EGD);  Surgeon: Vertell NovakJames L Edwards Jr., MD;  [facility];  Service: Endoscopy;  Laterality: N/A;  control of bleeding if needed  . Esophagogastroduodenoscopy N/A 10/03/2013    Procedure: ESOPHAGOGASTRODUODENOSCOPY (EGD);  Surgeon: Theda BelfastPatrick D Hung, MD;  [facility];  Service: Endoscopy;  Laterality: N/A;  Bedside  . Radiology with anesthesia Right 10/19/2013    Procedure: RADIOLOGY WITH ANESTHESIA;  Surgeon: Malachy MoanHeath McCullough, MD;   [facility];  Service: Radiology;  Laterality: Right;  . Radiology with anesthesia Left 12/28/2013    Procedure: RADIOLOGY WITH ANESTHESIA;  Surgeon: Reola CalkinsGlenn T Yamagata, MD;  [facility];  Service: Radiology;  Laterality: Left;  . Thrombectomy and revision of arterioventous (av) goretex  graft Left 01/25/2014    Procedure: THROMBECTOMY AND REVISION OF LEFT THIGH ARTERIOVENTOUS (AV) GORETEX  GRAFT WITH PATCH ANGIOPLASTY;  Surgeon: Pryor OchoaJames D Lawson, MD;  [facility];  Service: Vascular;  Laterality: Left;  . Thrombectomy and revision of arterioventous (av) goretex  graft Left 04/10/2014    Procedure: THROMBECTOMY AND REVISION OF ARTERIOVENTOUS (AV) GORETEX  GRAFT;  Surgeon: Sherren Kernsharles E Fields, MD;  [facility];  Service: Vascular;  Laterality: Left;  . Patch angioplasty Left 04/10/2014    Procedure: PATCH ANGIOPLASTY LEFT SFA;  Surgeon: Sherren Kernsharles E Fields, MD;  [facility];  Service: Vascular;  Laterality: Left;  . Insertion of dialysis catheter Bilateral 04/11/2014    Procedure: INSERTION OF DIALYSIS CATHETER RIGHT FEMORAL VEIN; INSERTION OF TRIPLE LUMEN LEFT FEMORAL VEIN CENTRAL LINE; REMOVAL OF DIALYSIS CATHETER IN RIGHT FEMORAL VEIN.;  Surgeon: Nada LibmanVance W Brabham, MD;  [facility] OR;  Service: Vascular;  Laterality: Bilateral;  . Exchange of a dialysis catheter Right 04/29/2014    Procedure: EXCHANGE OF A  FEMORAL DIALYSIS CATHETER;  Surgeon: Pryor Ochoa, MD;  Location: [facility] OR;  Service: Vascular;  Laterality: Right;   Prior to Admission medications   Medication Sig Start Date End Date Taking? Authorizing Provider  atorvastatin (LIPITOR) 20 MG tablet Take 20 mg by mouth at bedtime.    Yes Historical Provider, MD  cinacalcet (SENSIPAR) 30 MG tablet Take 30 mg by mouth daily with breakfast.    Yes Historical Provider, MD  clonazePAM (KLONOPIN) 0.5 MG tablet Take 0.25 mg by mouth 2 (two) times daily.   Yes Historical Provider, MD  folic acid-vitamin b complex-vitamin c-selenium-zinc  (DIALYVITE) 3 MG TABS tablet Take 1 tablet by mouth daily.   Yes Historical Provider, MD  midodrine (PROAMATINE) 10 MG tablet Take 15 mg by mouth 3 (three) times daily.    Yes Historical Provider, MD  pantoprazole (PROTONIX) 40 MG tablet Take 40 mg by mouth daily.   Yes Historical Provider, MD  phenytoin (DILANTIN) 100 MG ER capsule Take 200 mg by mouth at bedtime.   Yes Historical Provider, MD  sevelamer carbonate (RENVELA) 800 MG tablet Take 800 mg by mouth 3 (three) times daily with meals.   Yes Historical Provider, MD   No Known Allergies  FAMILY HISTORY:  History reviewed. No pertinent family history. SOCIAL HISTORY:  reports that he has never smoked. He has never used smokeless tobacco. He reports that he does not drink alcohol or use illicit drugs.  REVIEW OF SYSTEMS:  Patient is non-verbal and unable to answer questions.   SUBJECTIVE:   VITAL SIGNS: Temp:  [97.2 F (36.2 C)-102.8 F (39.3 C)] 99.6 F (37.6 C) (07/15 1045) Pulse Rate:  [69-145] 102 (07/15 0800) Resp:  [14-37] 28 (07/15 0800) BP: (63-168)/(28-132) 74/35 mmHg (07/15 0800) SpO2:  [87 %-100 %] 97 % (07/15 0800) FiO2 (%):  [40 %-100 %] 40 % (07/14 1805) Weight:  [59.5 kg (131 lb 2.8 oz)] 59.5 kg (131 lb 2.8 oz) (07/15 0346) HEMODYNAMICS:   VENTILATOR SETTINGS: Vent Mode:  [-] BIPAP FiO2 (%):  [40 %-100 %] 40 % INTAKE / OUTPUT: Intake/Output     07/14 0701 - 07/15 0700 07/15 0701 - 07/16 0700   I.V. (mL/kg) 750 (12.6)    IV Piggyback 1270    Total Intake(mL/kg) 2020 (33.9)    Net +2020          Stool Occurrence 1 x      PHYSICAL EXAMINATION: General:  Male of normal body habitus in NAD Neuro:  Alert, Non-verbal, does not follow commands.  HEENT:  [place]/AT, no JVD noted Cardiovascular:  RRR, no MRG Lungs:  Resps, even, unlabored. Rhonchi L base. Abdomen:  Soft, non-disteneded Musculoskeletal:  No acute deformity, +2 pedal pulses, no edema Skin:  HD site is covered, surrounding area does not appear to  have any edema or inflammation.   LABS:  CBC  Recent Labs Lab 04/29/14 1850 05/02/14 1639 05/03/14 0107  WBC 7.1 3.2* 11.6*  HGB 9.4* 8.8* 9.2*  HCT 30.2* 28.5* 29.9*  PLT 132* 101* 102*   Coag's  Recent Labs Lab 04/29/14 1850  INR 1.08   BMET  Recent Labs Lab 04/29/14 1850 05/02/14 1639 05/03/14 0107  NA 142 143 142  K 3.7 3.7 5.4*  CL 99 99 99  CO2 27 22 21   BUN 7 18 23   CREATININE 3.16* 5.44* 6.63*  GLUCOSE 90 112* 145*  Electrolytes  Recent Labs Lab 04/29/14 1850 05/02/14 1639 05/03/14 0107  CALCIUM 8.1* 7.5* 7.5*   Sepsis Markers  Recent Labs Lab 05/02/14 1648 05/03/14 0107 05/03/14 0905  LATICACIDVEN 5.75* 5.3* 4.1*   ABG  Recent Labs Lab 05/02/14 1734  PHART 7.503*  PCO2ART 33.2*  PO2ART 437.0*   Liver Enzymes  Recent Labs Lab 04/29/14 1850 05/02/14 1639  AST 35 28  ALT 34 <5  ALKPHOS 144* 125*  BILITOT 0.3 0.8  ALBUMIN 3.5 3.0*   Cardiac Enzymes  Recent Labs Lab 05/03/14 0107 05/03/14 0908  TROPONINI 0.88* 0.79*   Glucose  Recent Labs Lab 04/27/14 0747 04/27/14 0828 04/27/14 1711 04/29/14 2210 05/03/14 0508 05/03/14 0744  GLUCAP 68* 75 166* 91 169* 207*    Imaging Dg Chest Port 1 View  05/03/2014   CLINICAL DATA:  Shortness of breath and fever  EXAM: PORTABLE CHEST - 1 VIEW  COMPARISON:  05/02/2014  FINDINGS: No cardiomegaly. Unchanged mediastinal contours. There is no edema, consolidation, effusion, or pneumothorax. Dialysis catheter from below, tip at the level of the infrahepatic cava.  IMPRESSION: No active disease.   Electronically Signed   By: Tiburcio Pea M.D.   On: 05/03/2014 03:26   Dg Chest Portable 1 View  05/02/2014   CLINICAL DATA:  Altered mental status ; history of heart murmur and CHF and hypertension  EXAM: PORTABLE CHEST - 1 VIEW  COMPARISON:  Portable chest x-ray of April 29, 2014  FINDINGS: The lungs are mildly hypoinflated. There is no focal infiltrate. The heart and pulmonary  vascularity are unremarkable. There is no pleural effusion. There is moderate gaseous distention of the stomach. The bony thorax is unremarkable.  IMPRESSION: There is bilateral pulmonary hypoinflation but no evidence of acute cardiopulmonary abnormality.   Electronically Signed   By: David  [place]   On: 05/02/2014 16:55     CXR: 7/15, no active cardiopulmonary disease.   ASSESSMENT / PLAN:  PULMONARY A: Hypoxia  P:   Wean FiO2 as possible to maintain SpO2 greater than 92% F/u CXR to assess for evolving infiltrates IS if possible  CARDIOVASCULAR A:  Hypotension some degree of hypotension at baseline. Outpt notes have normal SBP 80's to 90's. Now in 80's. Chronically occluded SVC Elevated Troponin (improving) likely 2nd to hypotension at admission  P:  Would not recommend vasoactive medications at this time. Continue midodrine at home dose Continuous cardiac and BP monitoring.  Continue Statin  RENAL A:   ESRD on HD Hyperkalemia Lactic acidosis (improving, will need dialysis to determine).   P:   HD per renal, may want to consider diuresis today with elevated K If HD cath is source and needs to be removed, would family want to pursue aggressive measures? (pressors) Management per primary team.   GASTROINTESTINAL A:   GERD on PPI H/o Varices  P:   Continue protonix  HEMATOLOGIC A:   Anemia - chronic Mild Leukocytosis  P:  Follow CBC Transfuse 1 unit PRBC for Hgb less than 7  INFECTIOUS A:   SIRS/Sepsis source unclear. Potentially R permacath which was recently changed? LLL coarse, but no CXR evidence of PNA  P:   Continue empiric ABX Follow cultures Check PCT Monitor WBC and fever curve  ENDOCRINE A:   H/o Adrenal insufficiency  P:   Continue stress dose steroids Check CBG with SSI while on steroids.   NEUROLOGIC A:   Baseline nonverbal  History of seizures, none recent  P:    Continue home dilantin  Joneen Roach, ACNP [place]  Pulmonology/Critical Care Pager [phone] or [phone]  Patient is end-stage access, when contacted dialysis center and patient's primary nephrologist on a previous admission patient lives with SBP in the 70's.  SBP in the 80-90 is actually high for him.  Lactic acid today is 4.1 but patient is ESRD and would disregard this lactic acid level completely and not repeat it.  Patient is asymptomatic and as long as asymptomatic would not treat.  Primarily, need to address realistic goals of care here as even if the family is requesting pressors the patient is end-stage access and there is no place to place a central line to infuse pressors (continuous infusion through an infected catheter is not ideal here).  Unfortunately, there was no family bedside and code status could not be addressed beyond addressed above.  Recommend involvement of palliative care.    PCCM will sign off, please call back if needed.  I have personally obtained a history, examined the patient, evaluated laboratory and imaging results, formulated the assessment and plan and placed orders.  Alyson Reedy, M.D. [facility] Pulmonary/Critical Care Medicine. Pager: [phone]. After hours pager: [phone].

## 2014-05-03 NOTE — Progress Notes (Signed)
Subjective: [name] was readmitted last night. He was lethargic during interview this morning and did not point or engage as he has in the past. ROS was not possible given his cooperation. I spoke to his sister as detailed in the prior note. explained that her brother came in septic with a likely source of infection his R groin HD catheter. We discussed how this is his only access and that I spoke with Dr. Hart RochesterLawson who agreed but would exam [name] again and reassess his access options. She reiterated he is "do not resuscitate or intubate." I explained the potential need to transfer the patient to the ICU for pressors if they are required and asked if she would like to use pressors or no.   Objective: Vital signs in last 24 hours: Filed Vitals:   05/03/14 0630 05/03/14 0800 05/03/14 1045 05/03/14 1200  BP: 79/35 74/35  84/43  Pulse: 105 102  92  Temp:  102.8 F (39.3 C) 99.6 F (37.6 C) 98.4 F (36.9 C)  TempSrc:  Oral Oral Oral  Resp:  28  20  Height:      Weight:      SpO2: 100% 97%  99%   Weight change:   Intake/Output Summary (Last 24 hours) at 05/03/14 1235 Last data filed at 05/03/14 0220  Gross per 24 hour  Intake   2020 ml  Output      0 ml  Net   2020 ml   BP 84/43  Pulse 92  Temp(Src) 98.4 F (36.9 C) (Oral)  Resp 20  Ht 5\' 4"  (1.626 m)  Wt 131 lb 2.8 oz (59.5 kg)  BMI 22.50 kg/m2  SpO2 99%  General Appearance:    Somnolent, appears comfortable in bed,   HEENT:    Normocephalic, without obvious abnormality, atraumatic, intermittent eye contact, PERRL, L eye appears injected, moist mucous membranes  Neck:   Supple, symmetrical, trachea midline, no adenopathy;       thyroid:  No enlargement/tenderness/nodules; no carotid   bruit or JVD  Lungs:     Very difficult to appreciate lung sounds as he was noncooperative, tachypnic, respirations unlabored.  Heart:    Regular rate and rhythm, S1 and S2 normal, 2/6 systolic ejection murmur loudest at apex  Abdomen:      Soft, non-tender, bowel sounds active all four quadrants,    no masses, no organomegaly  Extremities:   R groin HD cath in place with dried blood under bandage  Pulses:   2+ and symmetric all extremities   Lab Results: Basic Metabolic Panel:  Recent Labs Lab 05/02/14 1639 05/03/14 0107  NA 143 142  K 3.7 5.4*  CL 99 99  CO2 22 21  GLUCOSE 112* 145*  BUN 18 23  CREATININE 5.44* 6.63*  CALCIUM 7.5* 7.5*   Liver Function Tests:  Recent Labs Lab 04/29/14 1850 05/02/14 1639  AST 35 28  ALT 34 <5  ALKPHOS 144* 125*  BILITOT 0.3 0.8  PROT 6.9 6.2  ALBUMIN 3.5 3.0*   No results found for this basename: LIPASE, AMYLASE,  in the last 168 hours  Recent Labs Lab 05/02/14 1639  AMMONIA 40   CBC:  Recent Labs Lab 04/29/14 1850 05/02/14 1639 05/03/14 0107 05/03/14 0230  WBC 7.1 3.2* 11.6*  --   NEUTROABS 5.0  --   --  11.0*  HGB 9.4* 8.8* 9.2*  --   HCT 30.2* 28.5* 29.9*  --   MCV 94.4 94.7 96.8  --  PLT 132* 101* 102*  --    Cardiac Enzymes:  Recent Labs Lab 05/03/14 0107 05/03/14 0908  TROPONINI 0.88* 0.79*   BNP: No results found for this basename: PROBNP,  in the last 168 hours D-Dimer: No results found for this basename: DDIMER,  in the last 168 hours CBG:  Recent Labs Lab 04/27/14 0828 04/27/14 1711 04/29/14 2210 05/03/14 0508 05/03/14 0744 05/03/14 1219  GLUCAP 75 166* 91 169* 207* 199*   Hemoglobin A1C: No results found for this basename: HGBA1C,  in the last 168 hours Fasting Lipid Panel: No results found for this basename: CHOL, HDL, LDLCALC, TRIG, CHOLHDL, LDLDIRECT,  in the last 168 hours Thyroid Function Tests: No results found for this basename: TSH, T4TOTAL, FREET4, T3FREE, THYROIDAB,  in the last 168 hours Coagulation:  Recent Labs Lab 04/29/14 1850  LABPROT 14.0  INR 1.08   Anemia Panel: No results found for this basename: VITAMINB12, FOLATE, FERRITIN, TIBC, IRON, RETICCTPCT,  in the last 168 hours Urine Drug  Screen: Drugs of Abuse  No results found for this basename: labopia,  cocainscrnur,  labbenz,  amphetmu,  thcu,  labbarb    Alcohol Level: No results found for this basename: ETH,  in the last 168 hours Urinalysis: No results found for this basename: COLORURINE, APPERANCEUR, LABSPEC, PHURINE, GLUCOSEU, HGBUR, BILIRUBINUR, KETONESUR, PROTEINUR, UROBILINOGEN, NITRITE, LEUKOCYTESUR,  in the last 168 hours Misc. Labs: Lactic acid 4.1 @ 9 am down from 5.75 on presentation  Micro Results: Recent Results (from the past 240 hour(s))  CULTURE, BLOOD (ROUTINE X 2)     Status: None   Collection Time    04/24/14  6:30 PM      Result Value Ref Range Status   Specimen Description BLOOD LEFT CENTRAL LINE   Final   Special Requests BOTTLES DRAWN AEROBIC AND ANAEROBIC 10CC   Final   Culture  Setup Time     Final   Value: 04/25/2014 00:22     Performed at [facility]   Culture     Final   Value: NO GROWTH 5 DAYS     Performed at [facility]   Report Status 05/01/2014 FINAL   Final  CULTURE, BLOOD (ROUTINE X 2)     Status: None   Collection Time    05/02/14  4:39 PM      Result Value Ref Range Status   Specimen Description BLOOD NECK   Final   Special Requests BOTTLES DRAWN AEROBIC AND ANAEROBIC 5 CC   Final   Culture  Setup Time     Final   Value: 05/02/2014 22:38     Performed at [facility]   Culture     Final   Value:        BLOOD CULTURE RECEIVED NO GROWTH TO DATE CULTURE WILL BE HELD FOR 5 DAYS BEFORE ISSUING A FINAL NEGATIVE REPORT     Performed at [facility]   Report Status PENDING   Incomplete   Studies/Results: Dg Chest Port 1 View  05/03/2014   CLINICAL DATA:  Shortness of breath and fever  EXAM: PORTABLE CHEST - 1 VIEW  COMPARISON:  05/02/2014  FINDINGS: No cardiomegaly. Unchanged mediastinal contours. There is no edema, consolidation, effusion, or pneumothorax. Dialysis catheter from below, tip at the level of the infrahepatic cava.   IMPRESSION: No active disease.   Electronically Signed   By: [name] Pea M.D.   On: 05/03/2014 03:26   Dg Chest Portable 1 View  05/02/2014   CLINICAL DATA:  Altered mental status ; history of heart murmur and CHF and hypertension  EXAM: PORTABLE CHEST - 1 VIEW  COMPARISON:  Portable chest x-ray of April 29, 2014  FINDINGS: The lungs are mildly hypoinflated. There is no focal infiltrate. The heart and pulmonary vascularity are unremarkable. There is no pleural effusion. There is moderate gaseous distention of the stomach. The bony thorax is unremarkable.  IMPRESSION: There is bilateral pulmonary hypoinflation but no evidence of acute cardiopulmonary abnormality.   Electronically Signed   By: David  [place]   On: 05/02/2014 16:55   Medications: I have reviewed the patient's current medications. Scheduled Meds: . ceFEPime (MAXIPIME) IV  2 g Intravenous Once  . [START ON 05/04/2014] ceFEPime (MAXIPIME) IV  2 g Intravenous Q T,Th,Sa-HD  . cinacalcet  30 mg Oral BID WC  . hydrocortisone sod succinate (SOLU-CORTEF) inj  50 mg Intravenous Q6H  . insulin aspart  0-9 Units Subcutaneous 6 times per day  . midodrine  15 mg Oral TID  . multivitamin  1 tablet Oral QHS  . pantoprazole  40 mg Oral Daily  . phenytoin  200 mg Oral QHS  . sevelamer carbonate  800 mg Oral TID WC  . sodium chloride  3 mL Intravenous Q12H  . [START ON 05/04/2014] vancomycin  500 mg Intravenous Q T,Th,Sa-HD   Continuous Infusions:  PRN Meds:.acetaminophen, acetaminophen Assessment/Plan: Principal Problem:   Septicemia Active Problems:   HYPOTENSION   ESRD (end stage renal disease)   Respiratory distress   Acute encephalopathy   SIRS (systemic inflammatory response syndrome)  Sepsis likely secondary to infected R femoral HD catheter: Patient is now off BiPAP with BP in the 70s/30s-40s after receiving ~ 2 L NS. He had a fever of 102.8 this morning. Lactic acid trending down but still elevated. This is likely secondary to an  infection with his recently displaced R groin HD catheter the source. Elevated troponins likely secondary to demand ischemia from hypotension but EKG is unchanged. Blood cultures pending and patient is on vancyomycin/cefepime pending cultures. A third new BCx was drawn when he spiked a fever this am. Critical care was called and informed of his condition should he require transfer to the ICU. Per conversation documented with sister [facility], she would like pressors if required. We discussed that this is his only access site and if it is infected and needs to be removed, he will have to transfer to supportive care. Dr. Hart Rochester was called and shared that he thinks the only other option would be to place a catheter retroperitoneally to the IVC by radiology but that he would see the patient.  -appreciate vascular surgery. -f/u Blood cultures x 2  -Continue cefepime and vancomycin per pharmacy  -solucortef 50 mg iv q6h -midodrine 15 mg TID -Trend troponins  -Trend lactic acid -Cardiac monitoring -klonopin held per AMS -NPO sips with meds  ESRD on HD: Just dialyzed on 05/02/14. Renal was called and made aware of patient. Has a new HD catheter in right femoral. Patient pulled on previous catheter, changed by Dr. Hart Rochester on 04/29/14. Cr 6.6. Renal made aware of his admission and will visit and see if he needs to be dialyzed. His K was 5.4 overnight and is being checked this afternoon -appreciate renal -I/Os  -TTS HD pending renal -Continue Cinacalcet, Dialyvite, Renvela  -f/u afternoon bmet  Acquired Adrenal Insufficiency: Patient has documented adrenal insufficiency, on Midodrine 15 mg tid at home. Was discharged on 10 day  prednisone taper which was not completed.  Given Solu-Cortef 100 IV twice. Will likely need to be continued on stress-dosed steroids to maintain BP. Hypotension likely contributed to by adrenal insufficiency.  -Solu-Cortef 50 mg IV Q6H  Hypotension: Patient is chronically  hypotensive. This morning he has been BP 70s/30s-40s. Pt was given NS 2L bolus. Likely due to acquired adrenal insufficiency v acute shock. On midodrine 15 mg TID.  -Previous inpatient goal was SBP > 75, consider fluids and/or pressors if hypotensive and symptomatic below baseline  -Continue midodrine 15 mg TID   Anemia: Hgb 9.2. Patient's level of engagement suggests that he is tolerating the anemia as at baseline. Most recent iron panel on 7/715 showed Iron 16, TIBC 189, Saturation 8, ferritin 176. Iron level low end of normal and TIBC is low but ferritin normal which can be due to anemia of chronic disease related to his kidneys.  -continue to monitor   History of seizures: No recent seizures. Unlikely that acute mental status change was a result of seizure since pt has respiratory distress and hypotension. Pt back at mental status baseline. On phenytoin 200 mg daily at home. Dilantin level was undetectable in ED.  -Received phenytoin 1 gram IV bolus  -Hold clonazepam for now due to altered mental status, can restart if AMS is resolved  -Seizure precautions  -Neuro checks   Mental retardation: Patient appears back at baseline from chart review and per family. Non-verbal, but grunts. He maintained intermittent eye contact with team. Followed basic commands such as opening mouth but not very cooperative  -Neuro checks  Dispo: Disposition is deferred at this time, awaiting improvement of current medical problems.  Anticipated discharge in approximately 3 day(s).   The patient does have a current PCP Courtney Paris, MD) and does need an [facility] hospital follow-up appointment after discharge.  The patient does have transportation limitations that hinder transportation to clinic appointments.  .Services Needed at time of discharge: Y = Yes, Blank = No PT:   OT:   RN:   Equipment:   Other:     LOS: 1 day   Lorenda Hatchet, MD 05/03/2014, 12:35 PM

## 2014-05-03 NOTE — Progress Notes (Signed)
I spoke with sister [name] (HCPOW) at 11:00 am with a status update on her brother. I explained that her brother came in septic with a likely source of infection his R groin HD catheter. We discussed how this is his only access and that I spoke with Dr. Hart RochesterLawson who agreed but would exam [name] again and reassess his access options. She reiterated he is "do not resuscitate or intubate." I explained the potential need to transfer the patient to the ICU for pressors if they are required and asked if she would like to use pressors or no. She said she would. [name] will continue to receive updates regarding her brother.  [name], [name] Bubar, MD 05/03/14 11:10 AM

## 2014-05-03 NOTE — H&P (Signed)
Attending admission note: I personally examined this patient. Physical findings, problem evaluation and management plan, accurate as recorded by resident physician Dr. Jill AlexandersAlexa Richardson. Clinical summary: Unfortunate 63 year old mentally handicapped man with end-stage renal disease on dialysis known to me from recent hospital admission. He has had increasing problems with vascular access. He suffered a hemorrhagic complication at time of a revision procedure in June. He required blood transfusion and temporary stabilization in the intensive care unit setting. There is a question of adrenal insufficiency which is poorly documented in the chart record. He is chronically hypotensive and is on midodrine. He currently has a right femoral dialysis catheter in place. Subsequent to discharge, he was manipulating the catheter which was partially retracted. It had to be replaced on July 11. He now presents with a change in his baseline mental status. Blood pressure lower than his baseline and tachycardia with pulse rate up to 145, noted at time of dialysis on July 14 and he is admitted for further evaluation. Although he did not have a fever initially, temperature has subsequently risen to 102.8. Lactic acid elevated at 5.7. He is likely septic. Treatment has been initiated with high-volume fluids and parenteral antibiotics and he was given stress dose steroids. Current exam: Lethargic African American man who is awake and responds to some commands. Blood pressure 74/35, pulse 102, temperature 99.6 F (37.6 C), temperature source Oral, resp. rate 28, height 5\' 4"  (1.626 m), weight 131 lb 2.8 oz (59.5 kg), SpO2 97.00%. He resists retraction of his eyelids. Pupils are equal round and reactive to light. Poor cooperation with lung exam. Anterior chest: breath sounds clear. Rapid, regular, cardiac rhythm with 2-3/6 systolic murmur at the left sternal border, abdomen is soft, obese, nontender, There is a dialysis  catheter in the right femoral region with some dried blood on the dressing. No surrounding erythema. No exudate. Extremities with 1+ edema. Impression: Septicemia related to femoral dialysis catheter infection Plan: As outlined above. Although his blood pressure is currently low, he is awake and appears to be mentating. Baseline blood pressure is chronically decreased. He was given stress dose steroids. He is receiving fluid resuscitation and antibiotics for his sepsis. During recent hospitalization end-of-life issues were addressed with his family. They indicated that they would not want aggressive life prolonging measures in the event of an irreversible deterioration in his situation. I do not feel that he needs transfer to the intensive care unit or pressor agents at this time. This will be reevaluated if his clinical situation deteriorates.

## 2014-05-04 DIAGNOSIS — A4150 Gram-negative sepsis, unspecified: Secondary | ICD-10-CM

## 2014-05-04 DIAGNOSIS — E2749 Other adrenocortical insufficiency: Secondary | ICD-10-CM

## 2014-05-04 LAB — CBC
HCT: 24 % — ABNORMAL LOW (ref 39.0–52.0)
HEMOGLOBIN: 7.7 g/dL — AB (ref 13.0–17.0)
MCH: 29.7 pg (ref 26.0–34.0)
MCHC: 32.1 g/dL (ref 30.0–36.0)
MCV: 92.7 fL (ref 78.0–100.0)
Platelets: 92 10*3/uL — ABNORMAL LOW (ref 150–400)
RBC: 2.59 MIL/uL — ABNORMAL LOW (ref 4.22–5.81)
RDW: 26.7 % — ABNORMAL HIGH (ref 11.5–15.5)
WBC: 15 10*3/uL — ABNORMAL HIGH (ref 4.0–10.5)

## 2014-05-04 LAB — BASIC METABOLIC PANEL
Anion gap: 20 — ABNORMAL HIGH (ref 5–15)
BUN: 49 mg/dL — ABNORMAL HIGH (ref 6–23)
CHLORIDE: 102 meq/L (ref 96–112)
CO2: 20 mEq/L (ref 19–32)
CREATININE: 8.58 mg/dL — AB (ref 0.50–1.35)
Calcium: 7.7 mg/dL — ABNORMAL LOW (ref 8.4–10.5)
GFR calc non Af Amer: 6 mL/min — ABNORMAL LOW (ref >90)
GFR, EST AFRICAN AMERICAN: 7 mL/min — AB (ref >90)
GLUCOSE: 113 mg/dL — AB (ref 70–99)
POTASSIUM: 5.9 meq/L — AB (ref 3.7–5.3)
Sodium: 142 mEq/L (ref 137–147)

## 2014-05-04 LAB — GLUCOSE, CAPILLARY
GLUCOSE-CAPILLARY: 115 mg/dL — AB (ref 70–99)
Glucose-Capillary: 104 mg/dL — ABNORMAL HIGH (ref 70–99)
Glucose-Capillary: 108 mg/dL — ABNORMAL HIGH (ref 70–99)
Glucose-Capillary: 123 mg/dL — ABNORMAL HIGH (ref 70–99)
Glucose-Capillary: 147 mg/dL — ABNORMAL HIGH (ref 70–99)
Glucose-Capillary: 95 mg/dL (ref 70–99)
Glucose-Capillary: 98 mg/dL (ref 70–99)

## 2014-05-04 MED ORDER — NEPRO/CARBSTEADY PO LIQD: 237.0000 mL | ORAL | Status: DC | PRN

## 2014-05-04 MED ORDER — HEPARIN SODIUM (PORCINE) 1000 UNIT/ML DIALYSIS
3500.0000 [IU] | Freq: Once | INTRAMUSCULAR | Status: DC
Start: 2014-05-04 — End: 2014-05-05
  Filled 2014-05-04: qty 4

## 2014-05-04 MED ORDER — LIDOCAINE-PRILOCAINE 2.5-2.5 % EX CREA
1.0000 "application " | TOPICAL_CREAM | CUTANEOUS | Status: DC | PRN
  Filled 2014-05-04: qty 5

## 2014-05-04 MED ORDER — PENTAFLUOROPROP-TETRAFLUOROETH EX AERO: 1.0000 "application " | INHALATION_SPRAY | CUTANEOUS | Status: DC | PRN

## 2014-05-04 MED ORDER — LIDOCAINE HCL (PF) 1 % IJ SOLN: 5.0000 mL | INTRAMUSCULAR | Status: DC | PRN

## 2014-05-04 MED ORDER — SODIUM CHLORIDE 0.9 % IV SOLN: 100.0000 mL | INTRAVENOUS | Status: DC | PRN

## 2014-05-04 MED ORDER — ALTEPLASE 2 MG IJ SOLR
4.0000 mg | INTRAMUSCULAR | Status: AC
  Administered 2014-05-04: 4 mg
  Filled 2014-05-04: qty 4

## 2014-05-04 MED ORDER — PIPERACILLIN-TAZOBACTAM IN DEX 2-0.25 GM/50ML IV SOLN
2.2500 g | Freq: Three times a day (TID) | INTRAVENOUS | Status: DC
  Administered 2014-05-04 – 2014-05-05 (×5): 2.25 g via INTRAVENOUS
  Filled 2014-05-04 (×8): qty 50

## 2014-05-04 MED ORDER — HEPARIN SODIUM (PORCINE) 1000 UNIT/ML DIALYSIS
1000.0000 [IU] | INTRAMUSCULAR | Status: DC | PRN
  Filled 2014-05-04: qty 1

## 2014-05-04 MED ORDER — ALTEPLASE 2 MG IJ SOLR
6.0000 mg | INTRAMUSCULAR | Status: DC
  Filled 2014-05-04: qty 6

## 2014-05-04 MED ORDER — ALTEPLASE 2 MG IJ SOLR
2.0000 mg | Freq: Once | INTRAMUSCULAR | Status: DC | PRN
  Filled 2014-05-04 (×2): qty 2

## 2014-05-04 NOTE — Progress Notes (Signed)
Hemodialysis-Cath not functioning after activase. Received approx 45 mins on HD with 200bfr. 30mins on 1K. Dr. Kathrene BongoGoldsborough aware. Will TPA ports and try again tomorrow.

## 2014-05-04 NOTE — Progress Notes (Signed)
Pt in hemodialysis w/ clotted catheter. Cathflo Activase ordered. Will dwell for one hour.

## 2014-05-04 NOTE — ED Provider Notes (Signed)
CSN: [id number]     Arrival date & time 05/02/14  1614 History   First MD Initiated Contact with Patient 05/02/14 1648     Chief Complaint  Patient presents with  . Altered Mental Status     (Consider location/radiation/quality/duration/timing/severity/associated sxs/prior Treatment) Patient is a 63 y.o. male presenting with altered mental status.  Altered Mental Status Presenting symptoms: behavior changes and unresponsiveness   Presenting symptoms: no combativeness   Severity:  Moderate Most recent episode:  Today Episode history:  Single Timing:  Constant Progression:  Unchanged Chronicity:  Recurrent Context comment:  Dialysis treatment   Past Medical History  Diagnosis Date  . Heart murmur, systolic 08/10/2009  . Syncope 05/07/2009  . 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 Esophageal varices 11/07/2008  . Gastric ulcer 10/09    antral, with h pylori positive  . Congestive heart failure 03/06/2008  . Cellulitis and abscess of leg, except foot 03/06/2008  . Secondary hyperparathyroidism 02/02/2008  . Mute 02/02/2008  . Hyperlipidemia 02/02/2008  . Anemia 02/02/2008  . ESRD (end stage renal disease)     TTS hemodialysis  . GERD (gastroesophageal reflux disease)   . Hypertension 02/02/2008    in history  . Mental retardation 02/02/2008  . Mute   . Seizures     "non in a while at home". none in past year .  Marland Kitchen. Complication of anesthesia     in the past BP has dropped    Past Surgical History  Procedure Laterality Date  . Left forearm graft      for HD  . Arteriovenous graft placement  11/22/10    Right thigh AVG  . Thrombectomy and revision of arterioventous (av) goretex  graft    . Thrombectomy and revision of arterioventous (av) goretex  graft  10/22/2012    Procedure: THROMBECTOMY AND REVISION OF ARTERIOVENTOUS (AV) GORETEX  GRAFT;  Surgeon: Larina Earthlyodd F Early, MD;  [facility];  Service: Vascular;  Laterality: Right;  . Thrombectomy w/ embolectomy   11/10/2012    Procedure: THROMBECTOMY ARTERIOVENOUS GORE-TEX GRAFT;  Surgeon: Pryor OchoaJames D Lawson, MD;  [facility];  Service: Vascular;  Laterality: Right;  . Thrombectomy and revision of arterioventous (av) goretex  graft Right 12/08/2012    Procedure: THROMBECTOMY AND REVISION OF ARTERIOVENTOUS (AV) GORETEX  GRAFT right thigh;  Surgeon: Sherren Kernsharles E Fields, MD;  [facility];  Service: Vascular;  Laterality: Right;  Susie Cassette. Venogram N/A 12/08/2012    Procedure: VENOGRAM;  Surgeon: Sherren Kernsharles E Fields, MD;  [facility];  Service: Vascular;  Laterality: N/A;  Intraoperative Central venogram  . Thrombectomy w/ embolectomy Right 12/12/2012    Procedure: THROMBECTOMY ARTERIOVENOUS GORE-TEX GRAFT;  Surgeon: Nada LibmanVance W Brabham, MD;  [facility];  Service: Vascular;  Laterality: Right;  . Insertion of dialysis catheter Left 12/14/2012    Procedure: INSERTION OF DIALYSIS CATHETER;  Surgeon: Nada LibmanVance W Brabham, MD;  [facility];  Service: Vascular;  Laterality: Left;  . Insertion of dialysis catheter Right 01/13/2013    Procedure: INSERTION OF DIALYSIS CATHETER;  Surgeon: Nada LibmanVance W Brabham, MD;  [facility];  Service: Vascular;  Laterality: Right;  . Removal of a dialysis catheter Left 01/13/2013    Procedure: REMOVAL OF A DIALYSIS CATHETER;  Surgeon: Nada LibmanVance W Brabham, MD;  [facility]: [facility] OR;  Service: Vascular;  Laterality: Left;  . Av fistula placement Left 02/11/2013    Procedure: INSERTION OF ARTERIOVENOUS (AV) GORE-TEX GRAFT THIGH;  Surgeon: Nada LibmanVance W Brabham, MD;  Location:  [facility] OR;  Service: Vascular;  Laterality: Left;  using 6mm x 50cm Gore-Tex Vascular Graft  . Esophagogastroduodenoscopy N/A 02/16/2013    Procedure: ESOPHAGOGASTRODUODENOSCOPY (EGD);  Surgeon: Vertell Novak., MD;  Location: [facility] ENDOSCOPY;  Service: Endoscopy;  Laterality: N/A;  bedside  . Esophagogastroduodenoscopy N/A 09/14/2013    Procedure: ESOPHAGOGASTRODUODENOSCOPY (EGD);  Surgeon: Vertell Novak., MD;  Location: [facility] ENDOSCOPY;   Service: Endoscopy;  Laterality: N/A;  control of bleeding if needed  . Esophagogastroduodenoscopy N/A 10/03/2013    Procedure: ESOPHAGOGASTRODUODENOSCOPY (EGD);  Surgeon: Theda Belfast, MD;  Location: [facility] ENDOSCOPY;  Service: Endoscopy;  Laterality: N/A;  Bedside  . Radiology with anesthesia Right 10/19/2013    Procedure: RADIOLOGY WITH ANESTHESIA;  Surgeon: Malachy Moan, MD;  [facility];  Service: Radiology;  Laterality: Right;  . Radiology with anesthesia Left 12/28/2013    Procedure: RADIOLOGY WITH ANESTHESIA;  Surgeon: Reola Calkins, MD;  Location: [facility] OR;  Service: Radiology;  Laterality: Left;  . Thrombectomy and revision of arterioventous (av) goretex  graft Left 01/25/2014    Procedure: THROMBECTOMY AND REVISION OF LEFT THIGH ARTERIOVENTOUS (AV) GORETEX  GRAFT WITH PATCH ANGIOPLASTY;  Surgeon: Pryor Ochoa, MD;  [facility];  Service: Vascular;  Laterality: Left;  . Thrombectomy and revision of arterioventous (av) goretex  graft Left 04/10/2014    Procedure: THROMBECTOMY AND REVISION OF ARTERIOVENTOUS (AV) GORETEX  GRAFT;  Surgeon: Sherren Kerns, MD;  Location[facility] OR;  Service: Vascular;  Laterality: Left;  . Patch angioplasty Left 04/10/2014    Procedure: PATCH ANGIOPLASTY LEFT SFA;  Surgeon: Sherren Kerns, MD;  Location: [facility] OR;  Service: Vascular;  Laterality: Left;  . Insertion of dialysis catheter Bilateral 04/11/2014    Procedure: INSERTION OF DIALYSIS CATHETER RIGHT FEMORAL VEIN; INSERTION OF TRIPLE LUMEN LEFT FEMORAL VEIN CENTRAL LINE; REMOVAL OF DIALYSIS CATHETER IN RIGHT FEMORAL VEIN.;  Surgeon: Nada Libman, MD;  [facility]: [facility] OR;  Service: Vascular;  Laterality: Bilateral;  . Exchange of a dialysis catheter Right 04/29/2014    Procedure: EXCHANGE OF A  FEMORAL DIALYSIS CATHETER;  Surgeon: Pryor Ochoa, MD;  [facility] OR;  Service: Vascular;  Laterality: Right;   History reviewed. No pertinent family history. History  Substance Use Topics  . Smoking  status: Never Smoker   . Smokeless tobacco: Never Used  . Alcohol Use: No    Review of Systems  Unable to perform ROS: Mental status change      Allergies  Review of patient's allergies indicates no known allergies.  Home Medications   Prior to Admission medications   Medication Sig Start Date End Date Taking? Authorizing Provider  atorvastatin (LIPITOR) 20 MG tablet Take 20 mg by mouth at bedtime.    Yes Historical Provider, MD  cinacalcet (SENSIPAR) 30 MG tablet Take 30 mg by mouth daily with breakfast.    Yes Historical Provider, MD  clonazePAM (KLONOPIN) 0.5 MG tablet Take 0.25 mg by mouth 2 (two) times daily.   Yes Historical Provider, MD  folic acid-vitamin b complex-vitamin c-selenium-zinc (DIALYVITE) 3 MG TABS tablet Take 1 tablet by mouth daily.   Yes Historical Provider, MD  midodrine (PROAMATINE) 10 MG tablet Take 15 mg by mouth 3 (three) times daily.    Yes Historical Provider, MD  pantoprazole (PROTONIX) 40 MG tablet Take 40 mg by mouth daily.   Yes Historical Provider, MD  phenytoin (DILANTIN) 100 MG ER capsule Take 200 mg by mouth at bedtime.   Yes Historical  Provider, MD  sevelamer carbonate (RENVELA) 800 MG tablet Take 800 mg by mouth 3 (three) times daily with meals.   Yes Historical Provider, MD   BP 80/41  Pulse 91  Temp(Src) 97.5 F (36.4 C) (Oral)  Resp 18  Ht 5\' 4"  (1.626 m)  Wt 131 lb 2.8 oz (59.5 kg)  BMI 22.50 kg/m2  SpO2 100% Physical Exam  Nursing note and vitals reviewed. Constitutional: He appears well-developed and well-nourished.  HENT:  Head: Normocephalic.  Eyes: Pupils are equal, round, and reactive to light.  Neck: Neck supple.  Cardiovascular: Regular rhythm.  Exam reveals no gallop and no friction rub.   Murmur heard. Tachycardic   Pulmonary/Chest: Effort normal. No respiratory distress.  Abdominal: Soft. He exhibits no distension. There is no tenderness.  Femoral line, blood noted around catheter no discharge   Musculoskeletal:  He exhibits no edema.  Neurological:  Will not track with eye  Will not communicate (patient baseline) Will not follow commands (patient baseline)   Pupils reactive,occular motion observed, will move all extremities but with out purpose,  not able to assess neuro exam further.   Skin: Skin is warm.  Psychiatric: He has a normal mood and affect.    ED Course  Procedures (including critical care time) Labs Review Labs Reviewed  CBC - Abnormal; Notable for the following:    WBC 3.2 (*)    RBC 3.01 (*)    Hemoglobin 8.8 (*)    HCT 28.5 (*)    RDW 27.3 (*)    Platelets 101 (*)    All other components within normal limits  COMPREHENSIVE METABOLIC PANEL - Abnormal; Notable for the following:    Glucose, Bld 112 (*)    Creatinine, Ser 5.44 (*)    Calcium 7.5 (*)    Albumin 3.0 (*)    Alkaline Phosphatase 125 (*)    GFR calc non Af Amer 10 (*)    GFR calc Af Amer 12 (*)    Anion gap 22 (*)    All other components within normal limits  PHENYTOIN LEVEL, TOTAL - Abnormal; Notable for the following:    Phenytoin Lvl <2.5 (*)    All other components within normal limits  TROPONIN I - Abnormal; Notable for the following:    Troponin I 0.88 (*)    All other components within normal limits  TROPONIN I - Abnormal; Notable for the following:    Troponin I 0.79 (*)    All other components within normal limits  TROPONIN I - Abnormal; Notable for the following:    Troponin I 1.10 (*)    All other components within normal limits  BASIC METABOLIC PANEL - Abnormal; Notable for the following:    Potassium 5.4 (*)    Glucose, Bld 145 (*)    Creatinine, Ser 6.63 (*)    Calcium 7.5 (*)    GFR calc non Af Amer 8 (*)    GFR calc Af Amer 9 (*)    Anion gap 22 (*)    All other components within normal limits  CBC - Abnormal; Notable for the following:    WBC 11.6 (*)    RBC 3.09 (*)    Hemoglobin 9.2 (*)    HCT 29.9 (*)    RDW 27.8 (*)    Platelets 102 (*)    All other components within  normal limits  LACTIC ACID, PLASMA - Abnormal; Notable for the following:    Lactic Acid, Venous 5.3 (*)  All other components within normal limits  DIFFERENTIAL - Abnormal; Notable for the following:    Neutro Abs 11.0 (*)    Lymphs Abs 0.2 (*)    Neutrophils Relative % 98 (*)    Lymphocytes Relative 1 (*)    Monocytes Relative 1 (*)    All other components within normal limits  LACTIC ACID, PLASMA - Abnormal; Notable for the following:    Lactic Acid, Venous 4.1 (*)    All other components within normal limits  GLUCOSE, CAPILLARY - Abnormal; Notable for the following:    Glucose-Capillary 169 (*)    All other components within normal limits  GLUCOSE, CAPILLARY - Abnormal; Notable for the following:    Glucose-Capillary 207 (*)    All other components within normal limits  BASIC METABOLIC PANEL - Abnormal; Notable for the following:    Potassium 6.2 (*)    Glucose, Bld 210 (*)    BUN 34 (*)    Creatinine, Ser 7.74 (*)    Calcium 7.4 (*)    GFR calc non Af Amer 6 (*)    GFR calc Af Amer 8 (*)    Anion gap 20 (*)    All other components within normal limits  GLUCOSE, CAPILLARY - Abnormal; Notable for the following:    Glucose-Capillary 199 (*)    All other components within normal limits  BASIC METABOLIC PANEL - Abnormal; Notable for the following:    Potassium 5.8 (*)    Glucose, Bld 166 (*)    BUN 41 (*)    Creatinine, Ser 8.17 (*)    Calcium 7.3 (*)    GFR calc non Af Amer 6 (*)    GFR calc Af Amer 7 (*)    Anion gap 18 (*)    All other components within normal limits  GLUCOSE, CAPILLARY - Abnormal; Notable for the following:    Glucose-Capillary 130 (*)    All other components within normal limits  GLUCOSE, CAPILLARY - Abnormal; Notable for the following:    Glucose-Capillary 155 (*)    All other components within normal limits  GLUCOSE, CAPILLARY - Abnormal; Notable for the following:    Glucose-Capillary 147 (*)    All other components within normal limits   I-STAT CG4 LACTIC ACID, ED - Abnormal; Notable for the following:    Lactic Acid, Venous 5.75 (*)    All other components within normal limits  I-STAT ARTERIAL BLOOD GAS, ED - Abnormal; Notable for the following:    pH, Arterial 7.503 (*)    pCO2 arterial 33.2 (*)    pO2, Arterial 437.0 (*)    Bicarbonate 26.1 (*)    Acid-Base Excess 3.0 (*)    All other components within normal limits  CULTURE, BLOOD (ROUTINE X 2)  CULTURE, BLOOD (ROUTINE X 2)  CULTURE, BLOOD (SINGLE)  WOUND CULTURE  AMMONIA  PROCALCITONIN  BASIC METABOLIC PANEL  Rosezena Sensor, ED    Imaging Review Dg Chest Port 1 View  05/03/2014   CLINICAL DATA:  Shortness of breath and fever  EXAM: PORTABLE CHEST - 1 VIEW  COMPARISON:  05/02/2014  FINDINGS: No cardiomegaly. Unchanged mediastinal contours. There is no edema, consolidation, effusion, or pneumothorax. Dialysis catheter from below, tip at the level of the infrahepatic cava.  IMPRESSION: No active disease.   Electronically Signed   By: Tiburcio Pea M.D.   On: 05/03/2014 03:26   Dg Chest Portable 1 View  05/02/2014   CLINICAL DATA:  Altered mental status ; history  of heart murmur and CHF and hypertension  EXAM: PORTABLE CHEST - 1 VIEW  COMPARISON:  Portable chest x-ray of April 29, 2014  FINDINGS: The lungs are mildly hypoinflated. There is no focal infiltrate. The heart and pulmonary vascularity are unremarkable. There is no pleural effusion. There is moderate gaseous distention of the stomach. The bony thorax is unremarkable.  IMPRESSION: There is bilateral pulmonary hypoinflation but no evidence of acute cardiopulmonary abnormality.   Electronically Signed   By: David  [place]   On: 05/02/2014 16:55     EKG Interpretation   Date/Time:  Tuesday May 02 2014 16:20:12 EDT Ventricular Rate:  142 PR Interval:  118 QRS Duration: 79 QT Interval:  302 QTC Calculation: 464 R Axis:   57 Text Interpretation:  Sinus tachycardia Nonspecific T abnormalities,  inferior  leads Baseline wander in lead(s) V2 Confirmed by Rhunette Croft, MD,  Janey Genta [phone]) on 05/02/2014 5:40:26 PM      MDM   Final diagnoses:  Septicemia  ESRD (end stage renal disease)  Hypotension, unspecified  SIRS (systemic inflammatory response syndrome)   63 y/o male with PMH severe MR (patient does not speak or follow commands, will only track with eyes) ESRD and septicemia that presents from dialysis where he had a sudden change in mental status and change in BP. Patient noted to have become hypotensive down to the 70's systolic and no longer tracking at dialysis, patient given 1L fluid bolus and sent to ED. Initial BP 112/94 and tachycardic- 145  Patient is not responding and will not track with eyes, lungs have course lung sounds, on [place] lung windows do not display evidence of edema with no B-lines noted. Cardiac US shows adequate pumping.   Possible sepsis from indwelling catheter, LA elevated and hypotensive, tachy, possible postictal, known seizure disorder with sub therapeutic Dilantin level, possible dehydration,   Patient placed on BiPAP and had improvement in mental status and able to be transitioned to [place]. Patient became hypotensive again in the ED and given 1.5 L total bolus.    Patient recently admitted with teach service and called for admission. Dr. Cyndie Chime accepted the patient. Patient is noted to be more alert but has a consistent low BP with systolic in 70's. Admitting team is very familiar with the patient and states that this is his baseline and to not treat further with bolus.      Clement Sayres, MD 05/04/14 Corky Crafts

## 2014-05-04 NOTE — Clinical Documentation Improvement (Signed)
Clinical Information documented in current MEDICAL RECORD NUMBER  - ED Nurse documentation - "Pt initial 02 level on RA ws 50%, unable to tolerate Bipap en route."   - Respiratory Rate in ED mid 20's to 30's   - Required oxygen by NRB then progressed to Bipap, subsequently weaned to 02 at 2 liter nasal cannula   - "Respiratory Distress" documented in H&P by Dr. Senaida Oresichardson     Possible Clinical Conditions:   - Acute Respiratory Failure, resolved   - Other Condition   - Unable to Clinically Determine   Thank You,  Jerral Ralphathy R Be[place] ,RN BSN CCDS Certified Clinical Documentation Specialist:  [phone] [facility]- Health Information Management

## 2014-05-04 NOTE — Progress Notes (Signed)
Subjective: Mr. Heinzman had no acute events overnight. He was less lethargic during interview this morning and did cooperate some with exam. ROS was not possible given his cooperation. He was seen by critical care yesterday (as well as vascular surgery and renal) and they do not believe he is a candidate for ICU care given his limited access. He had a BMP last night to follow his K which was 5.8 and an EKG was ordered for this morning. He is scheduled for HD this morning. I spoke with Dr Derenda Mis of palliative care about his case and she said she would see him and speak with family.  Objective: Vital signs in last 24 hours: Filed Vitals:   05/03/14 1659 05/03/14 2000 05/04/14 0000 05/04/14 0800  BP:  70/30 80/41   Pulse:  99 91   Temp:  97.9 F (36.6 C) 97.5 F (36.4 C) 97.5 F (36.4 C)  TempSrc:  Oral Oral Oral  Resp:    18  Height:      Weight:      SpO2: 100% 100% 100%    Weight change:   Intake/Output Summary (Last 24 hours) at 05/04/14 1042 Last data filed at 05/04/14 1000  Gross per 24 hour  Intake     30 ml  Output      0 ml  Net     30 ml   BP 80/41  Pulse 91  Temp(Src) 97.5 F (36.4 C) (Oral)  Resp 18  Ht 5\' 4"  (1.626 m)  Wt 131 lb 2.8 oz (59.5 kg)  BMI 22.50 kg/m2  SpO2 100%  General Appearance:    Awake and alert, diaphoretic, cooperated with exam (ie opened mouth when commanded)  HEENT:    Normocephalic, without obvious abnormality, atraumatic, intermittent eye contact, PERRL, L eye appears injected, moist mucous membranes  Lungs:     Very difficult to appreciate lung sounds as he was noncooperative, respirations unlabored.  Heart:    Regular rate and rhythm, S1 and S2 normal, 2/6 systolic ejection murmur loudest at apex  Abdomen:     Soft, non-tender, bowel sounds active all four quadrants,    no masses, no organomegaly  Extremities:   R groin HD cath in place with dried blood under bandage  Pulses:   2+ and symmetric all extremities   Lab  Results: Basic Metabolic Panel:  Recent Labs Lab 05/03/14 2013 05/04/14 0533  NA 141 142  K 5.8* 5.9*  CL 101 102  CO2 22 20  GLUCOSE 166* 113*  BUN 41* 49*  CREATININE 8.17* 8.58*  CALCIUM 7.3* 7.7*   Liver Function Tests:  Recent Labs Lab 04/29/14 1850 05/02/14 1639  AST 35 28  ALT 34 <5  ALKPHOS 144* 125*  BILITOT 0.3 0.8  PROT 6.9 6.2  ALBUMIN 3.5 3.0*   No results found for this basename: LIPASE, AMYLASE,  in the last 168 hours  Recent Labs Lab 05/02/14 1639  AMMONIA 40   CBC:  Recent Labs Lab 04/29/14 1850  05/03/14 0107 05/03/14 0230 05/04/14 0830  WBC 7.1  < > 11.6*  --  15.0*  NEUTROABS 5.0  --   --  11.0*  --   HGB 9.4*  < > 9.2*  --  7.7*  HCT 30.2*  < > 29.9*  --  24.0*  MCV 94.4  < > 96.8  --  92.7  PLT 132*  < > 102*  --  92*  < > = values in this interval  not displayed. Cardiac Enzymes:  Recent Labs Lab 05/03/14 0107 05/03/14 0908 05/03/14 1259  TROPONINI 0.88* 0.79* 1.10*   BNP: No results found for this basename: PROBNP,  in the last 168 hours D-Dimer: No results found for this basename: DDIMER,  in the last 168 hours CBG:  Recent Labs Lab 05/03/14 1219 05/03/14 1733 05/03/14 2029 05/04/14 0032 05/04/14 0425 05/04/14 0740  GLUCAP 199* 130* 155* 147* 123* 108*   Hemoglobin A1C: No results found for this basename: HGBA1C,  in the last 168 hours Fasting Lipid Panel: No results found for this basename: CHOL, HDL, LDLCALC, TRIG, CHOLHDL, LDLDIRECT,  in the last 168 hours Thyroid Function Tests: No results found for this basename: TSH, T4TOTAL, FREET4, T3FREE, THYROIDAB,  in the last 168 hours Coagulation:  Recent Labs Lab 04/29/14 1850  LABPROT 14.0  INR 1.08   Anemia Panel: No results found for this basename: VITAMINB12, FOLATE, FERRITIN, TIBC, IRON, RETICCTPCT,  in the last 168 hours Urine Drug Screen: Drugs of Abuse  No results found for this basename: labopia,  cocainscrnur,  labbenz,  amphetmu,  thcu,   labbarb    Alcohol Level: No results found for this basename: ETH,  in the last 168 hours Urinalysis: No results found for this basename: COLORURINE, APPERANCEUR, LABSPEC, PHURINE, GLUCOSEU, HGBUR, BILIRUBINUR, KETONESUR, PROTEINUR, UROBILINOGEN, NITRITE, LEUKOCYTESUR,  in the last 168 hours  Micro Results: Recent Results (from the past 240 hour(s))  CULTURE, BLOOD (ROUTINE X 2)     Status: None   Collection Time    04/24/14  6:30 PM      Result Value Ref Range Status   Specimen Description BLOOD LEFT CENTRAL LINE   Final   Special Requests BOTTLES DRAWN AEROBIC AND ANAEROBIC 10CC   Final   Culture  Setup Time     Final   Value: 04/25/2014 00:22     Performed at [facility]   Culture     Final   Value: NO GROWTH 5 DAYS     Performed at [facility]   Report Status 05/01/2014 FINAL   Final  CULTURE, BLOOD (ROUTINE X 2)     Status: None   Collection Time    05/02/14  4:39 PM      Result Value Ref Range Status   Specimen Description BLOOD NECK   Final   Special Requests BOTTLES DRAWN AEROBIC AND ANAEROBIC 5 CC   Final   Culture  Setup Time     Final   Value: 05/02/2014 22:38     Performed at [facility]   Culture     Final   Value: GRAM NEGATIVE RODS     Note: Gram Stain Report Called to,Read Back By and Verified With: Lovie Macadamia RN [phone]     Performed at [facility]   Report Status PENDING   Incomplete  CULTURE, BLOOD (ROUTINE X 2)     Status: None   Collection Time    05/02/14 11:25 PM      Result Value Ref Range Status   Specimen Description BLOOD RIGHT ARM   Final   Special Requests BOTTLES DRAWN AEROBIC ONLY 5CC   Final   Culture  Setup Time     Final   Value: 05/03/2014 08:46     Performed at [facility]   Culture     Final   Value: GRAM NEGATIVE RODS     1245 Note: Gram Stain Report Called to,Read Back By and Verified With:  MICHELLE [facility] 05/04/14 FULKC     [facility]   Report Status  PENDING   Incomplete  CULTURE, BLOOD (SINGLE)     Status: None   Collection Time    05/03/14  8:52 AM      Result Value Ref Range Status   Specimen Description BLOOD RIGHT ANTECUBITAL   Final   Special Requests BOTTLES DRAWN AEROBIC ONLY 2CC   Final   Culture  Setup Time     Final   Value: 05/03/2014 13:23     [facility]formed at [facility]   Culture     Final   Value: GRAM NEGATIVE RODS     Note: Gram Stain Report Called to,Read Back By and Verified With: STEPHANIE SHAW 05/04/14 1000 BY SMITHERSJ     [facility]formed at [facility]   Report Status PENDING   Incomplete   Studies/Results: Dg Chest Port 1 View  05/03/2014   CLINICAL DATA:  Shortness of breath and fever  EXAM: PORTABLE CHEST - 1 VIEW  COMPARISON:  05/02/2014  FINDINGS: No cardiomegaly. Unchanged mediastinal contours. There is no edema, consolidation, effusion, or pneumothorax. Dialysis catheter from below, tip at the level of the infrahepatic cava.  IMPRESSION: No active disease.   Electronically Signed   By: Tiburcio PeaJonathan  Watts M.D.   On: 05/03/2014 03:26   Dg Chest Portable 1 View  05/02/2014   CLINICAL DATA:  Altered mental status ; history of heart murmur and CHF and hypertension  EXAM: PORTABLE CHEST - 1 VIEW  COMPARISON:  Portable chest x-ray of April 29, 2014  FINDINGS: The lungs are mildly hypoinflated. There is no focal infiltrate. The heart and pulmonary vascularity are unremarkable. There is no pleural effusion. There is moderate gaseous distention of the stomach. The bony thorax is unremarkable.  IMPRESSION: There is bilateral pulmonary hypoinflation but no evidence of acute cardiopulmonary abnormality.   Electronically Signed   By: David  Swazil[place]Jordan   On: 05/02/2014 16:55   05/04/14 EKG: NSR, no changes, no peaked T waves  Medications: I have reviewed the patient's current medications. Scheduled Meds: . cinacalcet  30 mg Oral BID WC  . darbepoetin (ARANESP) injection - DIALYSIS  100 mcg Intravenous Q Thu-HD  .  doxercalciferol  10 mcg Intravenous Q T,Th,Sa-HD  . hydrocortisone sod succinate (SOLU-CORTEF) inj  50 mg Intravenous Q6H  . insulin aspart  0-9 Units Subcutaneous 6 times per day  . midodrine  15 mg Oral TID  . multivitamin  1 tablet Oral QHS  . pantoprazole  40 mg Oral Daily  . phenytoin  200 mg Oral QHS  . piperacillin-tazobactam (ZOSYN)  IV  2.25 g Intravenous 3 times per day  . sevelamer carbonate  800 mg Oral TID WC  . sodium chloride  3 mL Intravenous Q12H   Continuous Infusions:  PRN Meds:.acetaminophen, acetaminophen Assessment/Plan: Principal Problem:   Septicemia Active Problems:   HYPOTENSION   ESRD (end stage renal disease)   Respiratory distress   Acute encephalopathy   SIRS (systemic inflammatory response syndrome)  Gram Negative Rod Septicemia: Patient is now on nasal cannula with BP in the 70s-80s/30s-40s this morning/overnight. BCx x 2 positive for GNR, speciation pending. He has been afebrile and appeared much more lucid clinically on exam although WBC up at 15 from 12 yesterday. Lactic acid should not be trended as he needs HD. This may be due to his recently displaced R groin HD catheter the source although the site does not look infected.  Critical care does not believe he is a candidate for ICU care due to his access issues. Dr. Hart Rochester saw the patient and was called and shared that the catheter should only be removed as last resort. Case discussed with Derenda Mis of palliative care. I will call sister [facility] again with update. -appreciate vascular surgery, critical care, palliative care -f/u Blood cultures x 2 speciation, sensitivities -d/c vancyomycin, cefepine -zosyn 2.25 g q8h -solucortef 50 mg iv q6h -midodrine 15 mg TID -Cardiac monitoring -klonopin held per AMS -NPO sips with meds  ESRD on HD: Dialyzed on 05/02/14. Renal saw the patient and he is scheduled for HD today. Has a new HD catheter in right femoral. Patient pulled on previous catheter, changed  by Dr. Hart Rochester on 04/29/14. Cr 8.6 before HD.  His K was 5.9 overnight with no change in EKG -appreciate renal -I/Os  -TTS HD pending renal -Continue Cinacalcet, Dialyvite, Renvela   Acquired Adrenal Insufficiency: Patient has documented adrenal insufficiency, on Midodrine 15 mg tid at home. Was discharged on 10 day prednisone taper which was not completed.  Given Solu-Cortef 100 IV twice. Will likely need to be continued on stress-dosed steroids to maintain BP. Hypotension likely contributed to by adrenal insufficiency.  -Solu-Cortef 50 mg IV Q6H  Hypotension: Patient is chronically hypotensive. This morning he has been BP 70s-80s/30s-40s. Pt was given NS 2L bolus at admission. Likely due to acquired adrenal insufficiency v acute shock. On midodrine 15 mg TID.  -Previous inpatient goal was SBP > 75, consider fluids and/or pressors if hypotensive and symptomatic below baseline  -Continue midodrine 15 mg TID   Anemia: Hgb 7.7 down from 9.2 yesterday. Patient's level of engagement suggests that he is tolerating the anemia as at baseline. Most recent iron panel on 7/715 showed Iron 16, TIBC 189, Saturation 8, ferritin 176. Iron level low end of normal and TIBC is low but ferritin normal which can be due to anemia of chronic disease related to his kidneys. Required blood transfusion on last admission. -continue to monitor   History of seizures: No recent seizures. Unlikely that acute mental status change was a result of seizure since pt has respiratory distress and hypotension. Pt back at mental status baseline. On phenytoin 200 mg daily at home. Dilantin level was undetectable in ED.  -Received phenytoin 1 gram IV bolus  -Hold clonazepam for now due to altered mental status, can restart if AMS is resolved  -Seizure precautions  -Neuro checks   Mental retardation: Patient appears back at baseline from chart review and per family. Non-verbal, but grunts. He maintained intermittent eye contact with  team. Followed basic commands such as opening mouth but not very cooperative  -Neuro checks  Dispo: Disposition is deferred at this time, awaiting improvement of current medical problems.  Anticipated discharge in approximately 3 day(s).   The patient does have a current PCP Courtney Paris, MD) and does need an [facility] hospital follow-up appointment after discharge.  The patient does have transportation limitations that hinder transportation to clinic appointments.  .Services Needed at time of discharge: Y = Yes, Blank = No PT:   OT:   RN:   Equipment:   Other:     LOS: 2 days   Lorenda Hatchet, MD 05/04/2014, 10:42 AM

## 2014-05-04 NOTE — Progress Notes (Signed)
  [facility] Progress Note   Subjective: BP's still low.  Blood cx's + for GNR's.    Filed Vitals:   05/03/14 1659 05/03/14 2000 05/04/14 0000 05/04/14 0800  BP:  70/30 80/41   Pulse:  99 91   Temp:  97.9 F (36.6 C) 97.5 F (36.4 C) 97.5 F (36.4 C)  TempSrc:  Oral Oral Oral  Resp:    18  Height:      Weight:      SpO2: 100% 100% 100%    Exam: Awake and alert, mute No jvd Chest clear bilat RRR no MRG ABd soft, NTND, no ascites No LE or UE edema Neuro is nf, Ox 3 R femoral tunneled HD cath   D[facility]: [facility]  3hr 45mins 56kgs 160 400/1.5 2k/2ca 3500 R fem cath  hectorol 11 mcg IV/HD Arenesp 100 q week Venofer  Assessment:  1 Fever / septic shock- GNR's in blood, prob cath-related sepsis 2 ESRD - HD today or tomorrow as BP tolerates 3 Hyperkalemia 4 Hypotension- chronic issue on midodrine 5 Volume- up 3kg by wts from fluids given for shock 6 Adrenal insufficiency- on HC 7 Anemia - hgb 9.2. Cont ESA  8 HPTH cont meds 9 Nutrition - alb 3, NPO. multivit 10 Seizures- on dilantin 11 MR / mutism  Plan: May try HD today. Pt is near the EOL- I will discuss with palliative care practitioners. Do not send to ICU.   Vinson Moselleob Wolfgang Finigan MD pager [phone]    cell [phone]  05/04/2014, 11:49 AM     Recent Labs Lab 05/03/14 1259 05/03/14 2013 05/04/14 0533  NA 140 141 142  K 6.2* 5.8* 5.9*  CL 99 101 102  CO2 21 22 20   GLUCOSE 210* 166* 113*  BUN 34* 41* 49*  CREATININE 7.74* 8.17* 8.58*  CALCIUM 7.4* 7.3* 7.7*    Recent Labs Lab 04/29/14 1850 05/02/14 1639  AST 35 28  ALT 34 <5  ALKPHOS 144* 125*  BILITOT 0.3 0.8  PROT 6.9 6.2  ALBUMIN 3.5 3.0*    Recent Labs Lab 04/29/14 1850 05/02/14 1639 05/03/14 0107 05/03/14 0230 05/04/14 0830  WBC 7.1 3.2* 11.6*  --  15.0*  NEUTROABS 5.0  --   --  11.0*  --   HGB 9.4* 8.8* 9.2*  --  7.7*  HCT 30.2* 28.5* 29.9*  --  24.0*  MCV 94.4 94.7 96.8  --  92.7  PLT 132* 101* 102*  --  92*   .  cinacalcet  30 mg Oral BID WC  . darbepoetin (ARANESP) injection - DIALYSIS  100 mcg Intravenous Q Thu-HD  . doxercalciferol  10 mcg Intravenous Q T,Th,Sa-HD  . hydrocortisone sod succinate (SOLU-CORTEF) inj  50 mg Intravenous Q6H  . insulin aspart  0-9 Units Subcutaneous 6 times per day  . midodrine  15 mg Oral TID  . multivitamin  1 tablet Oral QHS  . pantoprazole  40 mg Oral Daily  . phenytoin  200 mg Oral QHS  . piperacillin-tazobactam (ZOSYN)  IV  2.25 g Intravenous 3 times per day  . sevelamer carbonate  800 mg Oral TID WC  . sodium chloride  3 mL Intravenous Q12H     acetaminophen, acetaminophen

## 2014-05-04 NOTE — Progress Notes (Signed)
Cathflo placed in catheter ports. Catheter clotted. Returned pt to room, will dialyze later per Dr. Arlean HoppingSchertz.

## 2014-05-04 NOTE — Progress Notes (Signed)
Patient ID: [name], male   DOB: [birth year], 63 y.o.   MRN: [id number] Attending physician note: I personally examined this patient today. Clinical status and management plan accurate as recorded by resident physician Dr. Farley LyAdam Rothman. There has been stabilization of his overall status with initial fluid boluses,  stress dose steroids, and current antibiotics. Blood pressure remains low but stable in the 80-90 mm systolic range and he is more alert. Blood cultures are already growing gram-negative rods. Despite any local inflammatory changes or exudate and his right dialysis catheter site, recent manipulation of the catheter almost certainly the etiology of his bacteremia. Ongoing discussions with the family with respect to limitations in what we can and cannot do for this man. He only has a single remaining vascular access site for dialysis. We would not be able to get pressor agents through this without compromising ability to continue dialysis. I personally discussed and updated the active issues with palliative care physician Dr. Ladona Ridgelaylor. The palliative care team have been assisting with family counseling in communications.

## 2014-05-04 NOTE — Progress Notes (Signed)
The patients sister Aura CampsJannie [facility]) was called for an update. She was informed that the patient's BP has been stable and he is going to HD today. She was told that he has a blood infection and is receiving antibiotics. She was also told that critical care evaluated the patient and does not feel that he is a candidate for vasopressors and ICU care given his one access point which may be a source of infection. Aura CampsJannie said she understood this information and that should he become more hypotensive this treatment is not an option. She had the opportunity to ask any questions. Code status was updated in the chart to reflect this information. Patient is now full DNR/DNI.   Farley Lyothman, Romar Woodrick, MD 03/04/14 1:20 PM

## 2014-05-04 NOTE — Procedures (Signed)
Patient was seen on dialysis and the procedure was supervised.  BFR 200  Via PC BP is  low. PC is not working- will try TPA overnight but if will not work in the AM will likely stop dialysis  [name] A 05/04/2014

## 2014-05-04 NOTE — Progress Notes (Addendum)
Patient ZO:XWRU:[name]      DOB: 1950-12-17      EAV:[id number]  Sister not at bedside.  Called. She is willing to come Friday 7/17 at 1230 for goals of care meeting.  Faven Watterson L. Ladona Ridgelaylor, MD MBA The Palliative Medicine Team at [facility] Team Phone: [phone] Pager: [phone]   Updated primary team on timing of consult and thrombosis of catheter during dialysis. Discussed with Dr. Arta SilenceShertz .  If TPA not successful this will mean no access for Jennette Kettleeal which patient's sister was aware could happen.  Will meet with her at appointed time.  Aritza Brunet L. Ladona Ridgelaylor, MD MBA The Palliative Medicine Team at [facility] Team Phone: [phone] Pager: [phone]

## 2014-05-05 ENCOUNTER — Encounter (HOSPITAL_COMMUNITY): Payer: Self-pay | Admitting: Oncology

## 2014-05-05 DIAGNOSIS — Z515 Encounter for palliative care: Secondary | ICD-10-CM

## 2014-05-05 DIAGNOSIS — A4152 Sepsis due to Pseudomonas: Secondary | ICD-10-CM

## 2014-05-05 DIAGNOSIS — G9340 Encephalopathy, unspecified: Secondary | ICD-10-CM

## 2014-05-05 HISTORY — DX: Sepsis due to Pseudomonas: A41.52

## 2014-05-05 LAB — BASIC METABOLIC PANEL
Anion gap: 20 — ABNORMAL HIGH (ref 5–15)
BUN: 60 mg/dL — AB (ref 6–23)
CHLORIDE: 101 meq/L (ref 96–112)
CO2: 21 mEq/L (ref 19–32)
Calcium: 7.3 mg/dL — ABNORMAL LOW (ref 8.4–10.5)
Creatinine, Ser: 8.89 mg/dL — ABNORMAL HIGH (ref 0.50–1.35)
GFR calc Af Amer: 6 mL/min — ABNORMAL LOW (ref >90)
GFR calc non Af Amer: 5 mL/min — ABNORMAL LOW (ref >90)
Glucose, Bld: 141 mg/dL — ABNORMAL HIGH (ref 70–99)
Potassium: 6.1 mEq/L — ABNORMAL HIGH (ref 3.7–5.3)
Sodium: 142 mEq/L (ref 137–147)

## 2014-05-05 LAB — CBC
HCT: 26.6 % — ABNORMAL LOW (ref 39.0–52.0)
Hemoglobin: 8.4 g/dL — ABNORMAL LOW (ref 13.0–17.0)
MCH: 30 pg (ref 26.0–34.0)
MCHC: 31.6 g/dL (ref 30.0–36.0)
MCV: 95 fL (ref 78.0–100.0)
Platelets: 96 10*3/uL — ABNORMAL LOW (ref 150–400)
RBC: 2.8 MIL/uL — ABNORMAL LOW (ref 4.22–5.81)
RDW: 26.8 % — ABNORMAL HIGH (ref 11.5–15.5)
WBC: 15.4 10*3/uL — ABNORMAL HIGH (ref 4.0–10.5)

## 2014-05-05 LAB — GLUCOSE, CAPILLARY
GLUCOSE-CAPILLARY: 146 mg/dL — AB (ref 70–99)
GLUCOSE-CAPILLARY: 114 mg/dL — AB (ref 70–99)
Glucose-Capillary: 154 mg/dL — ABNORMAL HIGH (ref 70–99)
Glucose-Capillary: 141 mg/dL — ABNORMAL HIGH (ref 70–99)
Glucose-Capillary: 140 mg/dL — ABNORMAL HIGH (ref 70–99)
Glucose-Capillary: 86 mg/dL (ref 70–99)

## 2014-05-05 LAB — PROCALCITONIN: Procalcitonin: >175 ng/mL

## 2014-05-05 MED ORDER — HEPARIN SODIUM (PORCINE) 1000 UNIT/ML DIALYSIS
1000.0000 [IU] | INTRAMUSCULAR | Status: DC | PRN
  Filled 2014-05-05: qty 1

## 2014-05-05 MED ORDER — LIDOCAINE HCL (PF) 1 % IJ SOLN: 5.0000 mL | INTRAMUSCULAR | Status: DC | PRN

## 2014-05-05 MED ORDER — ACETAMINOPHEN 325 MG PO TABS: 650.0000 mg | ORAL_TABLET | Freq: Four times a day (QID) | ORAL | Status: DC | PRN

## 2014-05-05 MED ORDER — NEPRO/CARBSTEADY PO LIQD: 237.0000 mL | ORAL | Status: DC | PRN

## 2014-05-05 MED ORDER — PENTAFLUOROPROP-TETRAFLUOROETH EX AERO
1.0000 | INHALATION_SPRAY | CUTANEOUS | Status: DC | PRN
Start: 2014-05-05 — End: 2014-05-06

## 2014-05-05 MED ORDER — SODIUM CHLORIDE 0.9 % IV SOLN: 100.0000 mL | INTRAVENOUS | Status: DC | PRN

## 2014-05-05 MED ORDER — ACETAMINOPHEN 650 MG RE SUPP: 650.0000 mg | Freq: Four times a day (QID) | RECTAL | Status: DC | PRN

## 2014-05-05 MED ORDER — ALTEPLASE 2 MG IJ SOLR
2.0000 mg | Freq: Once | INTRAMUSCULAR | Status: AC | PRN
  Filled 2014-05-05: qty 2

## 2014-05-05 MED ORDER — HEPARIN SODIUM (PORCINE) 1000 UNIT/ML DIALYSIS
3500.0000 [IU] | Freq: Once | INTRAMUSCULAR | Status: AC
  Administered 2014-05-05: 3500 [IU] via INTRAVENOUS_CENTRAL
  Filled 2014-05-05: qty 4

## 2014-05-05 MED ORDER — LIDOCAINE-PRILOCAINE 2.5-2.5 % EX CREA
1.0000 "application " | TOPICAL_CREAM | CUTANEOUS | Status: DC | PRN
  Filled 2014-05-05: qty 5

## 2014-05-05 NOTE — Care Management Note (Signed)
    Page 1 of 1   05/05/2014     3:59:04 PM CARE MANAGEMENT NOTE 05/05/2014  Patient:  [name],[name]   Account Number:  [id number]  Date Initiated:  05/05/2014  Documentation initiated by:  [name]  Subjective/Objective Assessment:   dx SIRS; lives with caregiver    PCP  [name]     DC Planning Services  CM consult      Status of service:  In process, will continue to follow Medicare Important Message given?  YES (If response is "NO", the following Medicare IM given date fields will be blank) Date Medicare IM given:  05/05/2014 Medicare IM given by:  [name] Date Additional Medicare IM given:   Additional Medicare IM given by:    Per UR Regulation:  Reviewed for med. necessity/level of care/duration of stay  Comments:  05/05/14 1554 [name] Hoefling RN MSN BSN CCM Attempting HD after TPA to cath overnight.

## 2014-05-05 NOTE — Progress Notes (Signed)
Patient ID: Rowe Clackeal W Parisien, male   DOB: 04/23/1951, 63 y.o.   MRN: [id number] Attending physician note: I personally examined this patient today and reviewed pertinent clinical and radiographic data. Overall situation remains unstable. Temperature spike despite broad-spectrum antibiotics. Multiple blood cultures positive. Dialysis nurses now report some problems with flow through his right femoral catheter. This catheter was just changed last week. Attempts at local thrombolytic therapy so far not working. This is his last vascular access site. Potassium rising secondary to incomplete dialysis run. He is alert and follows some commands. No longer tachycardic. Persistent hypotension but average systolic blood pressure of 80 mm. Family meeting scheduled for today with palliative care and internal medicine resident Dr. Valentino Saxonothman. Unfortunately we have reached the limit of what we can offer therapeutically. If family is in agreement I would suggest we try to make arrangements for placement in a hospice facility.

## 2014-05-05 NOTE — Progress Notes (Signed)
Subjective: [name] tried to get HD yesterday but his R femoral HD catheter was clotted. An attempt was made to lyze with clot with tPA. The success of this will be determined when he tries to receive HD today. Prior to this episode, I spoke to his sister [name] and gave her the documented update regarding the critical care attending's evaluation and her brother's GNR septicemia. There is a family meeting with palliative care scheduled for 12:30 today.  [name] was awake and alert during interview this morning and did cooperate some with exam. ROS was not possible given his cooperation. Dr. Burtis JunesSadek separately saw the patient and said that he appeared agitated, which he did not for me ~10 minutes earlier.  Objective: Vital signs in last 24 hours: Filed Vitals:   05/04/14 2018 05/05/14 0017 05/05/14 0400 05/05/14 0629  BP: 74/35 63/40 82/38    Pulse: 114 89 83   Temp: 101.2 F (38.4 C) 98.9 F (37.2 C) 98.2 F (36.8 C)   TempSrc: Axillary Oral Oral   Resp: 19     Height:      Weight:    134 lb 4.2 oz (60.9 kg)  SpO2: 95% 91% 95%    Weight change:   Intake/Output Summary (Last 24 hours) at 05/05/14 0848 Last data filed at 05/04/14 1900  Gross per 24 hour  Intake     86 ml  Output   -399 ml  Net    485 ml   BP 82/38  Pulse 83  Temp(Src) 98.2 F (36.8 C) (Oral)  Resp 19  Ht 5\' 4"  (1.626 m)  Wt 134 lb 4.2 oz (60.9 kg)  BMI 23.03 kg/m2  SpO2 95%  General Appearance:    Awake and alert, cooperated with exam (ie opened mouth when commanded)  HEENT:    Normocephalic, without obvious abnormality, atraumatic, intermittent eye contact, avoided pen light but PERRL, moist mucous membranes  Lungs:     Very difficult to appreciate lung sounds as he was noncooperative, respirations unlabored.  Heart:    Regular rate and rhythm, S1 and S2 normal, 2/6 systolic ejection murmur loudest at apex  Abdomen:     Soft, non-tender, bowel sounds active all four quadrants,    no masses, no  organomegaly  Extremities:   R groin HD cath in place with dried blood under bandage  Pulses:   2+ and symmetric all extremities   Lab Results: Basic Metabolic Panel:  Recent Labs Lab 05/04/14 0533 05/05/14 0500  NA 142 142  K 5.9* 6.1*  CL 102 101  CO2 20 21  GLUCOSE 113* 141*  BUN 49* 60*  CREATININE 8.58* 8.89*  CALCIUM 7.7* 7.3*   Liver Function Tests:  Recent Labs Lab 04/29/14 1850 05/02/14 1639  AST 35 28  ALT 34 <5  ALKPHOS 144* 125*  BILITOT 0.3 0.8  PROT 6.9 6.2  ALBUMIN 3.5 3.0*   No results found for this basename: LIPASE, AMYLASE,  in the last 168 hours  Recent Labs Lab 05/02/14 1639  AMMONIA 40   CBC:  Recent Labs Lab 04/29/14 1850  05/03/14 0230 05/04/14 0830 05/05/14 0500  WBC 7.1  < >  --  15.0* 15.4*  NEUTROABS 5.0  --  11.0*  --   --   HGB 9.4*  < >  --  7.7* 8.4*  HCT 30.2*  < >  --  24.0* 26.6*  MCV 94.4  < >  --  92.7 95.0  PLT 132*  < >  --  92* 96*  < > = values in this interval not displayed. Cardiac Enzymes:  Recent Labs Lab 05/03/14 0107 05/03/14 0908 05/03/14 1259  TROPONINI 0.88* 0.79* 1.10*   BNP: No results found for this basename: PROBNP,  in the last 168 hours D-Dimer: No results found for this basename: DDIMER,  in the last 168 hours CBG:  Recent Labs Lab 05/04/14 1558 05/04/14 1758 05/04/14 2022 05/05/14 0022 05/05/14 0402 05/05/14 0813  GLUCAP 115* 98 95 154* 146* 141*   Hemoglobin A1C: No results found for this basename: HGBA1C,  in the last 168 hours Fasting Lipid Panel: No results found for this basename: CHOL, HDL, LDLCALC, TRIG, CHOLHDL, LDLDIRECT,  in the last 168 hours Thyroid Function Tests: No results found for this basename: TSH, T4TOTAL, FREET4, T3FREE, THYROIDAB,  in the last 168 hours Coagulation:  Recent Labs Lab 04/29/14 1850  LABPROT 14.0  INR 1.08   Anemia Panel: No results found for this basename: VITAMINB12, FOLATE, FERRITIN, TIBC, IRON, RETICCTPCT,  in the last 168  hours Urine Drug Screen: Drugs of Abuse  No results found for this basename: labopia,  cocainscrnur,  labbenz,  amphetmu,  thcu,  labbarb    Alcohol Level: No results found for this basename: ETH,  in the last 168 hours Urinalysis: No results found for this basename: COLORURINE, APPERANCEUR, LABSPEC, PHURINE, GLUCOSEU, HGBUR, BILIRUBINUR, KETONESUR, PROTEINUR, UROBILINOGEN, NITRITE, LEUKOCYTESUR,  in the last 168 hours  Micro Results: Recent Results (from the past 240 hour(s))  CULTURE, BLOOD (ROUTINE X 2)     Status: None   Collection Time    05/02/14  4:39 PM      Result Value Ref Range Status   Specimen Description BLOOD NECK   Final   Special Requests BOTTLES DRAWN AEROBIC AND ANAEROBIC 5 CC   Final   Culture  Setup Time     Final   Value: 05/02/2014 22:38     Performed at [facility]   Culture     Final   Value: GRAM NEGATIVE RODS     Note: Gram Stain Report Called to,Read Back By and Verified With: Lovie Macadamia RN [phone]     Performed at [facility]   Report Status PENDING   Incomplete  CULTURE, BLOOD (ROUTINE X 2)     Status: None   Collection Time    05/02/14 11:25 PM      Result Value Ref Range Status   Specimen Description BLOOD RIGHT ARM   Final   Special Requests BOTTLES DRAWN AEROBIC ONLY 5CC   Final   Culture  Setup Time     Final   Value: 05/03/2014 08:46     Performed at [facility]   Culture     Final   Value: GRAM NEGATIVE RODS     1245 Note: Gram Stain Report Called to,Read Back By and Verified With: MICHELLE [facility] 05/04/14 FULKC     Performed at [facility]   Report Status PENDING   Incomplete  CULTURE, BLOOD (SINGLE)     Status: None   Collection Time    05/03/14  8:52 AM      Result Value Ref Range Status   Specimen Description BLOOD RIGHT ANTECUBITAL   Final   Special Requests BOTTLES DRAWN AEROBIC ONLY 2CC   Final   Culture  Setup Time     Final   Value: 05/03/2014 13:23     Performed at [facility]  Final   Value: GRAM NEGATIVE RODS     Note: Gram Stain Report Called to,Read Back By and Verified With: STEPHANIE SHAW 05/04/14 1000 BY SMITHERSJ     Performed at [facility]   Report Status PENDING   Incomplete  WOUND CULTURE     Status: None   Collection Time    05/04/14 10:48 AM      Result Value Ref Range Status   Specimen Description WOUND RIGHT GROIN   Final   Special Requests NONE   Final   Gram Stain     Final   Value: RARE WBC PRESENT,BOTH PMN AND MONONUCLEAR     NO SQUAMOUS EPITHELIAL CELLS SEEN     NO ORGANISMS SEEN     Performed at [facility]   Culture PENDING   Incomplete   Report Status PENDING   Incomplete   Studies/Results: No results found. 05/04/14 EKG: NSR, no changes, no peaked T waves  Medications: I have reviewed the patient's current medications. Scheduled Meds: . alteplase  6 mg Intracatheter STAT  . cinacalcet  30 mg Oral BID WC  . darbepoetin (ARANESP) injection - DIALYSIS  100 mcg Intravenous Q Thu-HD  . doxercalciferol  10 mcg Intravenous Q T,Th,Sa-HD  . heparin  3,500 Units Dialysis Once in dialysis  . hydrocortisone sod succinate (SOLU-CORTEF) inj  50 mg Intravenous Q6H  . insulin aspart  0-9 Units Subcutaneous 6 times per day  . midodrine  15 mg Oral TID  . multivitamin  1 tablet Oral QHS  . pantoprazole  40 mg Oral Daily  . phenytoin  200 mg Oral QHS  . piperacillin-tazobactam (ZOSYN)  IV  2.25 g Intravenous 3 times per day  . sevelamer carbonate  800 mg Oral TID WC  . sodium chloride  3 mL Intravenous Q12H   Continuous Infusions:  PRN Meds:.sodium chloride, sodium chloride, acetaminophen, acetaminophen, feeding supplement (NEPRO CARB STEADY), heparin, lidocaine (PF), lidocaine-prilocaine, pentafluoroprop-tetrafluoroeth Assessment/Plan: Principal Problem:   Septicemia Active Problems:   HYPOTENSION   ESRD (end stage renal disease)   Respiratory distress   Acute encephalopathy   SIRS (systemic  inflammatory response syndrome)  Gram Negative Rod Septicemia: Patient had a fever this morning of 101.2 and was tachycardic at 114. His GNR BCx do not yet have speciation or sensitivities so he remains on zosyn. His BP has been in the 60s-80s-30-40snow on nasal cannula with BP in the 70s-80s/30s-40s this morning/overnight. WBC count is stable at 15. Lactic acid should not be trended as he needs HD. This infection may be due to his recently displaced R groin HD catheter the source although the site does not look infected. Critical care does not believe he is a candidate for ICU care due to his access issues. Dr. Hart Rochester saw the patient and was called and shared that the catheter should only be removed as last resort. Case discussed with Derenda Mis of palliative care. There is a family meeting planned today with HCPOW [name]. -appreciate vascular surgery, critical care, palliative care -f/u Blood cultures x 2 speciation, sensitivities -cont zosyn 2.25 g q8h -solucortef 50 mg iv q6h -midodrine 15 mg TID -Cardiac monitoring -klonopin held per AMS -NPO sips with meds  ESRD on HD: Patient last dialyzed on 05/02/14. Attempt yesterday unsuccessful due to clot in last remaining vascular access. He received tPA and will reattempt HD today.   Cr 7.3 today before HD attempt.  His K is 6.1 as he needs dialysis. Vascular access issue will be  discussed today at family meeting. -appreciate renal -I/Os  -TTS HD pending success of tPA -Continue Cinacalcet, Dialyvite, Renvela   Acquired Adrenal Insufficiency: Patient has documented adrenal insufficiency, on Midodrine 15 mg tid at home. Was discharged on 10 day prednisone taper which was not completed.  Given Solu-Cortef 100 IV twice. Will likely need to be continued on stress-dosed steroids to maintain BP. Hypotension likely contributed to by adrenal insufficiency.  -Solu-Cortef 50 mg IV Q6H  Hypotension: Patient is chronically hypotensive. This morning he has  been BP 60s-80s/30s-40s which was his baseline at last hospitalization. Likely due to acquired adrenal insufficiency v acute shock. On midodrine 15 mg TID.  -Previous inpatient goal was SBP > 75, consider fluids and/or pressors if hypotensive and symptomatic below baseline  -Continue midodrine 15 mg TID   Anemia: Hgb 8.4 up from 7.7 yesterday. Patient's level of engagement suggests that he is tolerating the anemia as at baseline. Most recent iron panel on 7/715 showed Iron 16, TIBC 189, Saturation 8, ferritin 176. Iron level low end of normal and TIBC is low but ferritin normal which can be due to anemia of chronic disease related to his kidneys. Required blood transfusion on last admission. -continue to monitor   History of seizures: No recent seizures. Unlikely that acute mental status change was a result of seizure since pt has respiratory distress and hypotension. Pt back at mental status baseline. On phenytoin 200 mg daily at home. Dilantin level was undetectable in ED.  -Received phenytoin 1 gram IV bolus  -Hold clonazepam for now due to altered mental status, can restart if AMS is resolved  -Seizure precautions  -Neuro checks   Mental retardation: Patient appears back at baseline from chart review and per family. Non-verbal, but grunts. He maintained intermittent eye contact with team. Followed basic commands such as opening mouth but not very cooperative  -Neuro checks  Dispo: Disposition is deferred at this time, awaiting improvement of current medical problems.  Anticipated discharge in approximately 3 day(s).   The patient does have a current PCP Courtney Paris, MD) and does need an [facility] hospital follow-up appointment after discharge.  The patient does have transportation limitations that hinder transportation to clinic appointments.  .Services Needed at time of discharge: Y = Yes, Blank = No PT:   OT:   RN:   Equipment:   Other:     LOS: 3 days   Lorenda Hatchet,  MD 05/05/2014, 8:48 AM

## 2014-05-05 NOTE — Progress Notes (Signed)
Pt pointing at mitts and expressed that he wanted them back with jestures  settled down after they were  Put back on  . Called his sister and informed her of this  I think patient has claimed them as his own and I feel like he thinks we were taking something of his away mental status

## 2014-05-05 NOTE — Consult Note (Signed)
Patient ZO:XWRU NAJAI NIE      DOB: Dec 17, 1950      EAV:[id number]     Consult Note from the Palliative Medicine Team at [facility]    Consult Requested by: Dr. Cyndie Chime    PCP: Lars Masson, MD Reason for Consultation: revisit goals of care    Phone Number:[phone]  Assessment of patients Current state: 63 yr old african [place] male with cognitive delays, nonverbal who is cared for by his sister.  Patient has had recent SVC syndrome limiting ESRD catheter access. Presented with altered mentation likely secondary to infection.  I met with his sisters Cookie and Ms Arvilla Market, his caregiveer [place] and his nephew Chipper Herb.  Family was visibly disturbed about having to talk about goals again until they realized we were there to support them in their previous decisions and deliver new information. Overall, they desire to treat the treatable for Eugen until such time that all options have been exhausted . They will then focus on his comfort needs as he leaves this world.  See my note from the day of service.  Lakota has a quality of life unique to himself.   Ms. Arvilla Market would like to talk with the Vascular surgeon and continue to aggressively treat infection. She will continue to dialyze him until that can not be accomplished.  Ms. MIlls made clear that if he could have second line in place for pressors if he needed them she would want that treatment.   Goals of Care: 1.  Code Status: DNR   2. Scope of Treatment: Treat the treatable- infection , dialysis, feeding etc  4. Disposition: home to caregiver when medically stable.   3. Symptom Management:   1. Fidgets: will ask for sitter to protect his treatment options and assist with his needs. He tends to regress in the hospital and refuse treatments 2. PT eval   4. Psychosocial: Well loved and cared for by his sisters  5. Spiritual: made available to the family.        Patient Documents Completed or Given: Document Given Completed   Advanced Directives Pkt    MOST    DNR    Gone from My Sight    Hard Choices      Brief HPI: 63 yr old african [place] male with history of cognitive impairment, mutism presents with somnolence.  He has ESRD and recently pulled out his right groin catheter which was repositioned. He present with sepsis likely related to line.  He has known access issues. We have been asked to reassess goals of care.   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    PMH:  Past Medical History  Diagnosis Date  . Heart murmur, systolic 08/10/2009  . Syncope 05/07/2009  . 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 Esophageal varices 11/07/2008  . Gastric ulcer 10/09    antral, with h pylori positive  . Congestive heart failure 03/06/2008  . Cellulitis and abscess of leg, except foot 03/06/2008  . Secondary hyperparathyroidism 02/02/2008  . Mute 02/02/2008  . Hyperlipidemia 02/02/2008  . Anemia 02/02/2008  . ESRD (end stage renal disease)     TTS hemodialysis  . GERD (gastroesophageal reflux disease)   . Hypertension 02/02/2008    in history  . Mental retardation 02/02/2008  . Mute   . Seizures     "non in a while at home". none in past year .  Marland Kitchen Complication of anesthesia     in the past BP has dropped   .  Sepsis due to Pseudomonas species 05/05/2014     PSH: Past Surgical History  Procedure Laterality Date  . Left forearm graft      for HD  . Arteriovenous graft placement  11/22/10    Right thigh AVG  . Thrombectomy and revision of arterioventous (av) goretex  graft    . Thrombectomy and revision of arterioventous (av) goretex  graft  10/22/2012    Procedure: THROMBECTOMY AND REVISION OF ARTERIOVENTOUS (AV) GORETEX  GRAFT;  Surgeon: Larina Earthly, MD;  Location: [facility] OR;  Service: Vascular;  Laterality: Right;  . Thrombectomy w/ embolectomy  11/10/2012    Procedure: THROMBECTOMY ARTERIOVENOUS GORE-TEX GRAFT;  Surgeon: Pryor Ochoa, MD;  Location: [facility] OR;  Service: Vascular;  Laterality: Right;  .  Thrombectomy and revision of arterioventous (av) goretex  graft Right 12/08/2012    Procedure: THROMBECTOMY AND REVISION OF ARTERIOVENTOUS (AV) GORETEX  GRAFT right thigh;  Surgeon: Sherren Kerns, MD;  Location: [facility] OR;  Service: Vascular;  Laterality: Right;  Susie Cassette N/A 12/08/2012    Procedure: VENOGRAM;  Surgeon: Sherren Kerns, MD;  Location: [facility] OR;  Service: Vascular;  Laterality: N/A;  Intraoperative Central venogram  . Thrombectomy w/ embolectomy Right 12/12/2012    Procedure: THROMBECTOMY ARTERIOVENOUS GORE-TEX GRAFT;  Surgeon: Nada Libman, MD;  Location: [facility] OR;  Service: Vascular;  Laterality: Right;  . Insertion of dialysis catheter Left 12/14/2012    Procedure: INSERTION OF DIALYSIS CATHETER;  Surgeon: Nada Libman, MD;  Location: [facility] OR;  Service: Vascular;  Laterality: Left;  . Insertion of dialysis catheter Right 01/13/2013    Procedure: INSERTION OF DIALYSIS CATHETER;  Surgeon: Nada Libman, MD;  Location: [facility] OR;  Service: Vascular;  Laterality: Right;  . Removal of a dialysis catheter Left 01/13/2013    Procedure: REMOVAL OF A DIALYSIS CATHETER;  Surgeon: Nada Libman, MD;  Location: [facility] OR;  Service: Vascular;  Laterality: Left;  . Av fistula placement Left 02/11/2013    Procedure: INSERTION OF ARTERIOVENOUS (AV) GORE-TEX GRAFT THIGH;  Surgeon: Nada Libman, MD;  Location: [facility] OR;  Service: Vascular;  Laterality: Left;  using 6mm x 50cm Gore-Tex Vascular Graft  . Esophagogastroduodenoscopy N/A 02/16/2013    Procedure: ESOPHAGOGASTRODUODENOSCOPY (EGD);  Surgeon: Vertell Novak., MD;  Location: [facility] ENDOSCOPY;  Service: Endoscopy;  Laterality: N/A;  bedside  . Esophagogastroduodenoscopy N/A 09/14/2013    Procedure: ESOPHAGOGASTRODUODENOSCOPY (EGD);  Surgeon: Vertell Novak., MD;  Location: [facility] ENDOSCOPY;  Service: Endoscopy;  Laterality: N/A;  control of bleeding if needed  . Esophagogastroduodenoscopy N/A 10/03/2013    Procedure: ESOPHAGOGASTRODUODENOSCOPY (EGD);   Surgeon: Theda Belfast, MD;  Location: [facility] ENDOSCOPY;  Service: Endoscopy;  Laterality: N/A;  Bedside  . Radiology with anesthesia Right 10/19/2013    Procedure: RADIOLOGY WITH ANESTHESIA;  Surgeon: Malachy Moan, MD;  Location: [facility] OR;  Service: Radiology;  Laterality: Right;  . Radiology with anesthesia Left 12/28/2013    Procedure: RADIOLOGY WITH ANESTHESIA;  Surgeon: Reola Calkins, MD;  Location: [facility] OR;  Service: Radiology;  Laterality: Left;  . Thrombectomy and revision of arterioventous (av) goretex  graft Left 01/25/2014    Procedure: THROMBECTOMY AND REVISION OF LEFT THIGH ARTERIOVENTOUS (AV) GORETEX  GRAFT WITH PATCH ANGIOPLASTY;  Surgeon: Pryor Ochoa, MD;  Location: [facility] OR;  Service: Vascular;  Laterality: Left;  . Thrombectomy and revision of arterioventous (av) goretex  graft Left 04/10/2014    Procedure: THROMBECTOMY AND REVISION OF ARTERIOVENTOUS (AV) GORETEX  GRAFT;  Surgeon: Sherren Kerns, MD;  Location: [facility] OR;  Service: Vascular;  Laterality: Left;  . Patch angioplasty Left 04/10/2014    Procedure: PATCH ANGIOPLASTY LEFT SFA;  Surgeon: Sherren Kerns, MD;  Location: [facility] OR;  Service: Vascular;  Laterality: Left;  . Insertion of dialysis catheter Bilateral 04/11/2014    Procedure: INSERTION OF DIALYSIS CATHETER RIGHT FEMORAL VEIN; INSERTION OF TRIPLE LUMEN LEFT FEMORAL VEIN CENTRAL LINE; REMOVAL OF DIALYSIS CATHETER IN RIGHT FEMORAL VEIN.;  Surgeon: Nada Libman, MD;  Location: [facility] OR;  Service: Vascular;  Laterality: Bilateral;  . Exchange of a dialysis catheter Right 04/29/2014    Procedure: EXCHANGE OF A  FEMORAL DIALYSIS CATHETER;  Surgeon: Pryor Ochoa, MD;  Location: [facility] OR;  Service: Vascular;  Laterality: Right;   I have reviewed the FH and SH and  If appropriate update it with new information. No Known Allergies Scheduled Meds: . cinacalcet  30 mg Oral BID WC  . darbepoetin (ARANESP) injection - DIALYSIS  100 mcg Intravenous Q Thu-HD  . doxercalciferol  10 mcg  Intravenous Q T,Th,Sa-HD  . heparin  3,500 Units Dialysis Once in dialysis  . hydrocortisone sod succinate (SOLU-CORTEF) inj  50 mg Intravenous Q6H  . insulin aspart  0-9 Units Subcutaneous 6 times per day  . midodrine  15 mg Oral TID  . multivitamin  1 tablet Oral QHS  . pantoprazole  40 mg Oral Daily  . phenytoin  200 mg Oral QHS  . piperacillin-tazobactam (ZOSYN)  IV  2.25 g Intravenous 3 times per day  . sevelamer carbonate  800 mg Oral TID WC  . sodium chloride  3 mL Intravenous Q12H   Continuous Infusions:  PRN Meds:.sodium chloride, sodium chloride, acetaminophen, acetaminophen, alteplase, feeding supplement (NEPRO CARB STEADY), heparin, lidocaine (PF), lidocaine-prilocaine, pentafluoroprop-tetrafluoroeth    BP 90/46  Pulse 97  Temp(Src) 97.8 F (36.6 C) (Oral)  Resp 21  Ht 5\' 4"  (1.626 m)  Wt 60.6 kg (133 lb 9.6 oz)  BMI 22.92 kg/m2  SpO2 98%   PPS:40%   Intake/Output Summary (Last 24 hours) at 05/05/14 1444 Last data filed at 05/05/14 0900  Gross per 24 hour  Intake     30 ml  Output   -398 ml  Net    428 ml     Physical Exam:  General: No acute distress, shy, waved at me in dialysis HEENT:  PERRL, EOMI, anicteric Chest:   Decreased but clear CVS: regular S1, S2 Abdomen:soft, not distended or tender, right groin catheter. Ext: warm, slight edema in all extremities Neuro:nonverbal at baseline   Labs: CBC    Component Value Date/Time   WBC 15.4* 05/05/2014 0500   RBC 2.80* 05/05/2014 0500   RBC 2.30* 04/17/2014 0800   HGB 8.4* 05/05/2014 0500   HCT 26.6* 05/05/2014 0500   PLT 96* 05/05/2014 0500   MCV 95.0 05/05/2014 0500   MCH 30.0 05/05/2014 0500   MCHC 31.6 05/05/2014 0500   RDW 26.8* 05/05/2014 0500   LYMPHSABS 0.2* 05/03/2014 0230   MONOABS 0.1 05/03/2014 0230   EOSABS 0 05/03/2014 0230   BASOSABS 0 05/03/2014 0230     CMP     Component Value Date/Time   NA 142 05/05/2014 0500   K 6.1* 05/05/2014 0500   CL 101 05/05/2014 0500   CO2 21 05/05/2014  0500   GLUCOSE 141* 05/05/2014 0500   BUN 60* 05/05/2014 0500   CREATININE 8.89* 05/05/2014 0500   CALCIUM 7.3* 05/05/2014 0500  PROT 6.2 05/02/2014 1639   ALBUMIN 3.0* 05/02/2014 1639   AST 28 05/02/2014 1639   ALT <5 05/02/2014 1639   ALKPHOS 125* 05/02/2014 1639   BILITOT 0.8 05/02/2014 1639   GFRNONAA 5* 05/05/2014 0500   GFRAA 6* 05/05/2014 0500    Chest Xray Reviewed/Impressions:No active disease.        Time In Time Out Total Time Spent with Patient Total Overall Time  100 pm 220 pm  20  min 80 min    Greater than 50%  of this time was spent counseling and coordinating care related to the above assessment and plan.  Gurpreet Mariani L. Ladona Ridgel, MD MBA The Palliative Medicine Team at [facility] Phone: [phone] Pager: [phone] ( Use team phone after hours)

## 2014-05-05 NOTE — Progress Notes (Signed)
[facility] Progress Note   Subjective: Catheter would not work for HD yest despite TPA in cath for a few hours; catheter TPA'd overnight.   Filed Vitals:   05/05/14 0017 05/05/14 0400 05/05/14 0629 05/05/14 0800  BP: 63/40 82/38  84/44  Pulse: 89 83  82  Temp: 98.9 F (37.2 C) 98.2 F (36.8 C)  97.8 F (36.6 C)  TempSrc: Oral Oral  Oral  Resp:      Height:      Weight:   60.9 kg (134 lb 4.2 oz)   SpO2: 91% 95%  99%   Exam: Awake and alert, mute No jvd Chest clear bilat RRR no MRG ABd soft, NTND, no ascites No LE or UE edema Neuro is nf, Ox 3 R femoral tunneled HD cath   [facility]  3hr 56kgs 160 400/1.5 2k/2ca Heparin 3500 R fem cath  hectorol 11 mcg IV/HD Arenesp 100 q week Venofer  Assessment:  1 Infected femoral HD cath- GNR's in blood cultures on IV Zosyn 2 ESRD on HD 3 Hyperkalemia 4 Hypotension- chronic issue on midodrine 5 Volume- up 4kg 6 Adrenal insufficiency- on HC 7 Anemia on aranesp 8 HPTH cont meds 9 Nutrition - alb 3, NPO. multivit 10 Seizures- on dilantin 11 [name] / mutism  Plan: Pt is DNR, orders from family are for no ICU care or aggressive interventions.  Palliative meeting w family today.  Will attempt HD again today after TPA to cath overnight. A femoral catheter is obviously not a good access for [name] and there are no other options.  Patient is now end-stage access.  Repeated replacing the catheter would not improve his QOL and would only add more pain and discomfort to the situation due to catheter infections and the need for repeated procedures.  Would favor transition to hospice care and no further dialysis if/when this access ceases to function.  Vinson Moselle MD pager [phone]    cell [phone]  05/05/2014, 10:15 AM     Recent Labs Lab 05/03/14 2013 05/04/14 0533 05/05/14 0500  NA 141 142 142  K 5.8* 5.9* 6.1*  CL 101 102 101  CO2 22 20 21   GLUCOSE 166* 113* 141*  BUN 41* 49* 60*  CREATININE  8.17* 8.58* 8.89*  CALCIUM 7.3* 7.7* 7.3*    Recent Labs Lab 04/29/14 1850 05/02/14 1639  AST 35 28  ALT 34 <5  ALKPHOS 144* 125*  BILITOT 0.3 0.8  PROT 6.9 6.2  ALBUMIN 3.5 3.0*    Recent Labs Lab 04/29/14 1850  05/03/14 0107 05/03/14 0230 05/04/14 0830 05/05/14 0500  WBC 7.1  < > 11.6*  --  15.0* 15.4*  NEUTROABS 5.0  --   --  11.0*  --   --   HGB 9.4*  < > 9.2*  --  7.7* 8.4*  HCT 30.2*  < > 29.9*  --  24.0* 26.6*  MCV 94.4  < > 96.8  --  92.7 95.0  PLT 132*  < > 102*  --  92* 96*  < > = values in this interval not displayed. Marland Kitchen alteplase  6 mg Intracatheter STAT  . cinacalcet  30 mg Oral BID WC  . darbepoetin (ARANESP) injection - DIALYSIS  100 mcg Intravenous Q Thu-HD  . doxercalciferol  10 mcg Intravenous Q T,Th,Sa-HD  . heparin  3,500 Units Dialysis Once in dialysis  . hydrocortisone sod succinate (SOLU-CORTEF) inj  50 mg Intravenous Q6H  . insulin aspart  0-9  Units Subcutaneous 6 times per day  . midodrine  15 mg Oral TID  . multivitamin  1 tablet Oral QHS  . pantoprazole  40 mg Oral Daily  . phenytoin  200 mg Oral QHS  . piperacillin-tazobactam (ZOSYN)  IV  2.25 g Intravenous 3 times per day  . sevelamer carbonate  800 mg Oral TID WC  . sodium chloride  3 mL Intravenous Q12H     sodium chloride, sodium chloride, acetaminophen, acetaminophen, feeding supplement (NEPRO CARB STEADY), heparin, lidocaine (PF), lidocaine-prilocaine, pentafluoroprop-tetrafluoroeth

## 2014-05-05 NOTE — Progress Notes (Signed)
SLP Cancellation Note  Patient Details Name: [name] MRN: [id number] DOB: [birth year]   Cancelled treatment:       Reason Eval/Treat Not Completed: Patient at procedure or test/unavailable. Pt at HD.  Will f/u next date.    Blenda MountsCouture, Neenah Canter Laurice 05/05/2014, 4:13 PM

## 2014-05-05 NOTE — Consult Note (Signed)
Patient ZO:XWRU:[name]      DOB: September 25, 1951      EAV:[id number]  Summary of Goals of Care/ full note to follow:  Meet with Sister [name] who is POA , Sister Cookie, Care Giver [place], and Emerson Electricephew Elnathan.  Family remains firm in decision that if Jennette Kettleeal can have the treatable treated they want treatment including pressors if a CTL can be placed to deliver. They state that if they are faced with an inability to deliver can they will make the right choice, which will be to move to comfort care.  They will not make preemptive decisions to not seek treatment as in their eyes this is tantamount to ending his life.  Mrs. Arvilla MarketMills would like to speak directly to Vascular regarding access information.   They would like to get Eh's diet restarted asap and get him mobilized if possible as soon as possible.  Jennette Kettleeal does better with a sitter and so we will check into this for him.    Some important things to know about Jennette Kettleeal: - he likes to initiate physical contact - he will turn his head or shrug you off if he doesn't want to engage -he would prefer to be called Jennette KettleNeal -when giving meds telling him politely to take them not asking will get you fruther. And if he declines give him some space and come back.  - He like puzzles, and cartoons -he will communicate by clapping and pointing. - he likes "sweet young things"   Recommend:  1.   DNR was established last visit and stands per the chart.  We did not hash over this.  Sister would like to have pressors given if mechanically able and absolutely necessary. Understands that access may be a limitation.  Understands that should not use his HD catheter.  2.  Ask for a sitter for at least part of the day to give [place] assistance with eating and distraction. This will be very therapeutic  3.  Dress catheter with anything but tap which bothers him and he tries to pick at it and in turn pulls out catheter.    4.  Will try to obtain some puzzles to entertain him  with.  5.  Get up and ambulating as soon as it is safe.  6. Have Speech eval for best texture and safety with feeding.  Jennette Kettleeal generally feeds himself and the family would prefer that.  7. Sister would like to talk with Vascular in person.  Total time 100 pm - 220 pm  Discussed with Dr. Cyndie ChimeGranfortuna, and Dr. Vanetta Shawlothman  Thermon Zulauf L. Ladona Ridgelaylor, MD MBA The Palliative Medicine Team at [facility] Team Phone: [phone] Pager: [phone]

## 2014-05-06 DIAGNOSIS — A498 Other bacterial infections of unspecified site: Secondary | ICD-10-CM

## 2014-05-06 LAB — CULTURE, BLOOD (SINGLE)

## 2014-05-06 LAB — GLUCOSE, CAPILLARY
GLUCOSE-CAPILLARY: 102 mg/dL — AB (ref 70–99)
GLUCOSE-CAPILLARY: 134 mg/dL — AB (ref 70–99)
GLUCOSE-CAPILLARY: 141 mg/dL — AB (ref 70–99)
GLUCOSE-CAPILLARY: 83 mg/dL (ref 70–99)
Glucose-Capillary: 128 mg/dL — ABNORMAL HIGH (ref 70–99)
Glucose-Capillary: 131 mg/dL — ABNORMAL HIGH (ref 70–99)

## 2014-05-06 LAB — CBC
HCT: 26 % — ABNORMAL LOW (ref 39.0–52.0)
Hemoglobin: 8.5 g/dL — ABNORMAL LOW (ref 13.0–17.0)
MCH: 29.2 pg (ref 26.0–34.0)
MCHC: 32.7 g/dL (ref 30.0–36.0)
MCV: 89.3 fL (ref 78.0–100.0)
Platelets: 113 10*3/uL — ABNORMAL LOW (ref 150–400)
RBC: 2.91 MIL/uL — ABNORMAL LOW (ref 4.22–5.81)
RDW: 25.8 % — AB (ref 11.5–15.5)
WBC: 13 10*3/uL — ABNORMAL HIGH (ref 4.0–10.5)

## 2014-05-06 LAB — BASIC METABOLIC PANEL
Anion gap: 17 — ABNORMAL HIGH (ref 5–15)
BUN: 23 mg/dL (ref 6–23)
CALCIUM: 7.8 mg/dL — AB (ref 8.4–10.5)
CO2: 25 mEq/L (ref 19–32)
Chloride: 98 mEq/L (ref 96–112)
Creatinine, Ser: 4.14 mg/dL — ABNORMAL HIGH (ref 0.50–1.35)
GFR, EST AFRICAN AMERICAN: 16 mL/min — AB (ref >90)
GFR, EST NON AFRICAN AMERICAN: 14 mL/min — AB (ref >90)
Glucose, Bld: 140 mg/dL — ABNORMAL HIGH (ref 70–99)
POTASSIUM: 4.8 meq/L (ref 3.7–5.3)
Sodium: 140 mEq/L (ref 137–147)

## 2014-05-06 LAB — CULTURE, BLOOD (ROUTINE X 2)

## 2014-05-06 LAB — PHOSPHORUS: PHOSPHORUS: 4 mg/dL (ref 2.3–4.6)

## 2014-05-06 MED ORDER — DOXERCALCIFEROL 4 MCG/2ML IV SOLN
INTRAVENOUS | Status: AC
  Administered 2014-05-06: 10 ug via INTRAVENOUS
  Filled 2014-05-06: qty 6

## 2014-05-06 MED ORDER — HYDROCORTISONE 20 MG PO TABS
50.0000 mg | ORAL_TABLET | Freq: Four times a day (QID) | ORAL | Status: DC
  Administered 2014-05-06 – 2014-05-07 (×2): 50 mg via ORAL
  Filled 2014-05-06 (×6): qty 1

## 2014-05-06 MED ORDER — LIDOCAINE-PRILOCAINE 2.5-2.5 % EX CREA
1.0000 "application " | TOPICAL_CREAM | CUTANEOUS | Status: DC | PRN
  Filled 2014-05-06: qty 5

## 2014-05-06 MED ORDER — DEXTROSE 5 % IV SOLN
2.0000 g | INTRAVENOUS | Status: DC
  Administered 2014-05-06 – 2014-05-11 (×2): 2 g via INTRAVENOUS
  Filled 2014-05-06 (×5): qty 2

## 2014-05-06 MED ORDER — SODIUM CHLORIDE 0.9 % IV SOLN: 100.0000 mL | INTRAVENOUS | Status: DC | PRN

## 2014-05-06 MED ORDER — HEPARIN SODIUM (PORCINE) 1000 UNIT/ML DIALYSIS
3500.0000 [IU] | Freq: Once | INTRAMUSCULAR | Status: DC
  Filled 2014-05-06: qty 4

## 2014-05-06 MED ORDER — LIDOCAINE HCL (PF) 1 % IJ SOLN: 5.0000 mL | INTRAMUSCULAR | Status: DC | PRN

## 2014-05-06 MED ORDER — NEPRO/CARBSTEADY PO LIQD: 237.0000 mL | ORAL | Status: DC | PRN

## 2014-05-06 MED ORDER — HEPARIN SODIUM (PORCINE) 1000 UNIT/ML DIALYSIS
1000.0000 [IU] | INTRAMUSCULAR | Status: DC | PRN
  Filled 2014-05-06: qty 1

## 2014-05-06 MED ORDER — ALTEPLASE 2 MG IJ SOLR
2.0000 mg | Freq: Once | INTRAMUSCULAR | Status: DC | PRN
  Filled 2014-05-06: qty 2

## 2014-05-06 MED ORDER — PENTAFLUOROPROP-TETRAFLUOROETH EX AERO: 1.0000 "application " | INHALATION_SPRAY | CUTANEOUS | Status: DC | PRN

## 2014-05-06 MED ORDER — HYDROCORTISONE 20 MG PO TABS
50.0000 mg | ORAL_TABLET | Freq: Four times a day (QID) | ORAL | Status: DC
  Administered 2014-05-06 (×3): 50 mg via ORAL
  Filled 2014-05-06 (×6): qty 1

## 2014-05-06 MED ORDER — LEVOFLOXACIN 750 MG PO TABS
750.0000 mg | ORAL_TABLET | Freq: Once | ORAL | Status: AC
  Administered 2014-05-06: 750 mg via ORAL
  Filled 2014-05-06: qty 1

## 2014-05-06 NOTE — Plan of Care (Signed)
Problem: Phase I Progression Outcomes Goal: Voiding-avoid urinary catheter unless indicated Outcome: Not Applicable Date Met:  32/67/12 ESRD hemodialysis T/Th/Sat

## 2014-05-06 NOTE — Progress Notes (Signed)
Patient GN:FAOZ:Alaric Dossie ArbourW Kirshner      DOB: July 03, 1951      HYQ:[id number]  Jennette Kettleeal was comfortable this am.  Nursing states tried to take mits off he pointed and wanted them back on.  Ask Nursing assistant to help him feed himself.  Plan for dialysis this am . Shadowing for support of important issues like feeding, least restrictive environment etc.   Norva Bowe L. Ladona Ridgelaylor, MD MBA The Palliative Medicine Team at [facility] Team Phone: [phone] Pager: [phone]

## 2014-05-06 NOTE — Plan of Care (Signed)
Problem: Phase I Progression Outcomes Goal: Hemodynamically stable Outcome: Not Progressing Soft BP, MD aware

## 2014-05-06 NOTE — Progress Notes (Signed)
Subjective: Pt lost PIV overnight and was transitioned to oral medication equivalents. Will try to give IV abx during HD if catheter continues to work. Pt interactive this morning but seems withdrawn.   Objective: Vital signs in last 24 hours: Filed Vitals:   05/05/14 2002 05/05/14 2353 05/06/14 0000 05/06/14 0351  BP: _0 83/44  Pulse: 101 93 93 95  Temp: 98 F (36.7 C) 97.6 F (36.4 C)  98.2 F (36.8 C)  TempSrc: Oral Oral  Oral  Resp:      Height:      Weight:      SpO2: 93% 92% 95% 94%   Weight change: 2 lb 3.3 oz (1 kg)  Intake/Output Summary (Last 24 hours) at 05/06/14 0709 Last data filed at 05/05/14 1715  Gross per 24 hour  Intake      0 ml  Output      1 ml  Net     -1 ml   BP 83/44  Pulse 95  Temp(Src) 98.2 F (36.8 C) (Oral)  Resp 24  Ht _1  (1.626 m)  Wt 133 lb 13.1 oz (60.7 kg)  BMI 22.96 kg/m2  SpO2 94%  General Appearance:    Awake and alert, not interested in participating with commands  HEENT:     moist mucous membranes  Lungs:     Very difficult to appreciate lung sounds as he was noncooperative, respirations unlabored.  Heart:    Regular rate and rhythm, S1 and S2 normal, 2/6 systolic ejection murmur loudest at apex  Abdomen:     Soft, non-tender, bowel sounds active all four quadrants,    no masses, no organomegaly  Extremities:   R groin HD cath in place with dried blood under bandage  Pulses:   2+ and symmetric all extremities   Lab Results: Basic Metabolic Panel:  Recent Labs Lab 05/05/14 0500 05/06/14 0250  NA 142 140  K 6.1* 4.8  CL 101 98  CO2 21 25  GLUCOSE 141* 140*  BUN 60* 23  CREATININE 8.89* 4.14*  CALCIUM 7.3* 7.8*   Liver Function Tests:  Recent Labs Lab 04/29/14 1850 05/02/14 1639  AST 35 28  ALT 34 <5  ALKPHOS 144* 125*  BILITOT 0.3 0.8  PROT 6.9 6.2  ALBUMIN 3.5 3.0*    Recent Labs Lab 05/02/14 1639  AMMONIA 40   CBC:  Recent Labs Lab 04/29/14 1850  05/03/14 0230   05/05/14 0500 05/06/14 0250  WBC 7.1  < >  --   < > 15.4* 13.0*  NEUTROABS 5.0  --  11.0*  --   --   --   HGB 9.4*  < >  --   < > 8.4* 8.5*  HCT 30.2*  < >  --   < > 26.6* 26.0*  MCV 94.4  < >  --   < > 95.0 89.3  PLT 132*  < >  --   < > 96* 113*  < > = values in this interval not displayed. Cardiac Enzymes:  Recent Labs Lab 05/03/14 0107 05/03/14 0908 05/03/14 1259  TROPONINI 0.88* 0.79* 1.10*   CBG:  Recent Labs Lab 05/05/14 0813 05/05/14 1205 05/05/14 1734 05/05/14 2005 05/05/14 2352 05/06/14 0349  GLUCAP 141* 140* 86 114* 131* 128*   Coagulation:  Recent Labs Lab 04/29/14 1850  LABPROT 14.0  INR 1.08   Micro Results: Recent Results (from the past 240 hour(s))  CULTURE, BLOOD (ROUTINE X 2)  Status: None   Collection Time    05/02/14  4:39 PM      Result Value Ref Range Status   Specimen Description BLOOD NECK   Final   Special Requests BOTTLES DRAWN AEROBIC AND ANAEROBIC 5 CC   Final   Culture  Setup Time     Final   Value: 05/02/2014 22:38     Performed at [facility]   Culture     Final   Value: PSEUDOMONAS AERUGINOSA     Note: Gram Stain Report Called to,Read Back By and Verified With: Rico Sheehan RN [phone]     Performed at [facility]   Report Status PENDING   Incomplete  CULTURE, BLOOD (ROUTINE X 2)     Status: None   Collection Time    05/02/14 11:25 PM      Result Value Ref Range Status   Specimen Description BLOOD RIGHT ARM   Final   Special Requests BOTTLES DRAWN AEROBIC ONLY 5CC   Final   Culture  Setup Time     Final   Value: 05/03/2014 08:46     Performed at [facility]   Culture     Final   Value: PSEUDOMONAS AERUGINOSA     1245 Note: Gram Stain Report Called to,Read Back By and Verified With: MICHELLE [facility] 05/04/14 [place]     Performed at [facility]   Report Status PENDING   Incomplete  CULTURE, BLOOD (SINGLE)     Status: None   Collection Time    05/03/14  8:52 AM      Result Value  Ref Range Status   Specimen Description BLOOD RIGHT ANTECUBITAL   Final   Special Requests BOTTLES DRAWN AEROBIC ONLY 2CC   Final   Culture  Setup Time     Final   Value: 05/03/2014 13:23     Performed at [facility]   Culture     Final   Value: PSEUDOMONAS AERUGINOSA     Note: Gram Stain Report Called to,Read Back By and Verified With: STEPHANIE SHAW 05/04/14 1000 BY SMITHERSJ     Performed at [facility]   Report Status PENDING   Incomplete  WOUND CULTURE     Status: None   Collection Time    05/04/14 10:48 AM      Result Value Ref Range Status   Specimen Description WOUND RIGHT GROIN   Final   Special Requests NONE   Final   Gram Stain     Final   Value: RARE WBC PRESENT,BOTH PMN AND MONONUCLEAR     NO SQUAMOUS EPITHELIAL CELLS SEEN     NO ORGANISMS SEEN     Performed at [facility]   Culture PENDING   Incomplete   Report Status PENDING   Incomplete   Studies/Results: 05/04/14 EKG: NSR, no changes, no peaked T waves  Medications: I have reviewed the patient's current medications. Scheduled Meds: . cinacalcet  30 mg Oral BID WC  . darbepoetin (ARANESP) injection - DIALYSIS  100 mcg Intravenous Q Thu-HD  . doxercalciferol  10 mcg Intravenous Q T,Th,Sa-HD  . hydrocortisone  50 mg Oral Q6H  . insulin aspart  0-9 Units Subcutaneous 6 times per day  . midodrine  15 mg Oral TID  . multivitamin  1 tablet Oral QHS  . pantoprazole  40 mg Oral Daily  . phenytoin  200 mg Oral QHS  . sevelamer carbonate  800 mg Oral TID WC  .  sodium chloride  3 mL Intravenous Q12H   Continuous Infusions:  PRN Meds:.sodium chloride, sodium chloride, acetaminophen, acetaminophen, feeding supplement (NEPRO CARB STEADY), heparin, lidocaine (PF), lidocaine-prilocaine, pentafluoroprop-tetrafluoroeth Assessment/Plan:  Sepsis 2/2 Proteus bacteremia: Pt originally met all SIRS criteria.This infection may be due to his recently displaced R groin HD catheter although the site does  not look infected. PCCM was consulted when pt continued to be hypotensive and given access issues will not be able to provide pressor support despite HCPOA wishes to continue with pressors this continues to be an issue especially since the patient lost all PIV access at this time as well. Dr. Kellie Simmering of vascular surgery evaluated the patient and felt the catheter should only be removed as last resort.  -appreciate vascular surgery, critical care, palliative care input  -f/u Blood cultures sensitivities -zosyn 2.25 g q8h>> was transitioned to oral levaquin q48h  -midodrine 15 mg TID  ESRD on HD: K+ now normalized in setting of having successful HD treatment. There is still grave concern that HD cath may become nonfunctional at any point which will terminate pt ability for continued HD per nephrology.  -appreciate renal and vascular input  -I/Os  -TTS HD pending success of tPA -Continue Cinacalcet, Dialyvite, Renvela   Acquired Adrenal Insufficiency: Patient has documented adrenal insufficiency, on Midodrine 15 mg tid at home. Was discharged on 10 day prednisone taper which was not completed.  Given Solu-Cortef 100 IV twice. Will likely need to be continued on stress-dosed steroids to maintain BP. Hypotension likely contributed to by adrenal insufficiency.  -Solu-Cortef 50 mg IV Q6H>> transition to po prednisone and slowly taper off since has lost IV access.   Hypotension: Patient is chronically hypotensive with unknown etiology but likely due to acquired adrenal insufficiency v acute shock. On midodrine 15 mg TID.   -Continue midodrine 15 mg TID  -palliative support if becomes symptomatic   History of seizures: No recent seizures. Unlikely that acute mental status change was a result of seizure since pt has respiratory distress and hypotension. Pt back at mental status baseline. On phenytoin 200 mg daily at home. Dilantin level was undetectable in ED.  -cont dilantin  -Seizure precautions  -Neuro  checks   Mental retardation: Patient appears back at baseline from chart review and per family. Non-verbal, but grunts. He maintained intermittent eye contact with team.   Dispo: Disposition is deferred at this time, awaiting improvement of current medical problems.  Anticipated discharge in approximately 3 day(s).   The patient does have a current PCP Corky Sox, MD) and does need an [facility] hospital follow-up appointment after discharge.  The patient does have transportation limitations that hinder transportation to clinic appointments.  .Services Needed at time of discharge: Y = Yes, Blank = No PT:   OT:   RN:   Equipment:   Other:     LOS: 4 days   Clinton Gallant, MD 05/06/2014, 7:09 AM

## 2014-05-06 NOTE — Progress Notes (Signed)
INTERNAL MEDICINE TEACHING SERVICE Night Float Progress Note   Subjective:    We were called overnight by the RN in relation to [name]. Mr. Johnathan HausenHarris's IV access in his had was lost. The site became infiltrated and edematous and IV meds could not be pushed through. IV team was called and no alternate IV access could be found and established.   Objective:    BP 95/58  Pulse 93  Temp(Src) 97.6 F (36.4 C) (Oral)  Resp 24  Ht 5\' 4"  (1.626 m)  Wt 133 lb 13.1 oz (60.7 kg)  BMI 22.96 kg/m2  SpO2 95%   Labs: Basic Metabolic Panel:    Component Value Date/Time   NA 142 05/05/2014 0500   K 6.1* 05/05/2014 0500   CL 101 05/05/2014 0500   CO2 21 05/05/2014 0500   BUN 60* 05/05/2014 0500   CREATININE 8.89* 05/05/2014 0500   GLUCOSE 141* 05/05/2014 0500   CALCIUM 7.3* 05/05/2014 0500    CBC:    Component Value Date/Time   WBC 15.4* 05/05/2014 0500   HGB 8.4* 05/05/2014 0500   HCT 26.6* 05/05/2014 0500   PLT 96* 05/05/2014 0500   MCV 95.0 05/05/2014 0500   NEUTROABS 11.0* 05/03/2014 0230   LYMPHSABS 0.2* 05/03/2014 0230   MONOABS 0.1 05/03/2014 0230   EOSABS 0 05/03/2014 0230   BASOSABS 0 05/03/2014 0230    Cardiac Enzymes: Lab Results  Component Value Date   CKTOTAL 72 11/22/2010   CKMB 1.4 11/22/2010   TROPONINI 1.10* 05/03/2014     Assessment/ Plan:    We discussed his medications with pharmacy and made the following changes in order to switch his medications from IV to oral. The following changes have been made:  Solucortef changed to Cortef 50 mg po Q6H. Zosyn chaged to Levaquin 750 mg po once.  The nurse has agreed to contact us if there is any trouble giving [name] his medications by mouth.   Jill AlexandersAlexa Richardson, DO PGY-1 Internal Medicine Resident Pager # [phone] 05/06/2014 1:05 AM

## 2014-05-06 NOTE — Procedures (Signed)
I was present at this dialysis session, have reviewed the session itself and made  appropriate changes  Vinson Moselleob Shaquoia Miers MD (pgr) [phone]    [phone]) [phone] 05/06/2014, 12:15 PM

## 2014-05-06 NOTE — Progress Notes (Signed)
MD contacted; pt unable to tolerate whole pills, however PO's ordered can not be crushed per pharmacy.  MD acknowledged nurse concerns, per MD; administration per PO med's will be determined during rounds.  Nurse will continue to monitor

## 2014-05-06 NOTE — Progress Notes (Signed)
[facility] Progress Note   Subjective: Palliative care met with family and they do not want any limits set on medical care , other than DNR.     Filed Vitals:   05/06/14 0000 05/06/14 0351 05/06/14 0743 05/06/14 0800  BP: 95/58 83/44 86/41    Pulse: 93 95 100   Temp:  98.2 F (36.8 C) 98.5 F (36.9 C)   TempSrc:  Oral Oral   Resp:   18   Height:      Weight:      SpO2: 95% 94% 91% 93%   Exam: Awake and alert, mute No jvd Chest clear bilat RRR no MRG ABd soft, NTND, no ascites No LE or UE edema Neuro is nf, Ox 3 R femoral tunneled HD cath   [facility]  3hr 9mns 56kgs 160 400/1.5 2k/2ca Heparin 3500 R fem cath  hectorol 11 mcg IV/HD Arenesp 100 q week Venofer  Assessment:  1 Pseudomonas bacteremia due to infected R groin HD cath- no IV access, changed to po levaquin 2 ESRD on HD- will need to d/c HD cath after HD today 3 Hyperkalemia- resolved 4 Hypotension- chronic issue on midodrine 5 Volume- up 4kg 6 Adrenal insufficiency- on HC 7 Anemia on aranesp 8 HPTH cont vit D, renagel, check phos 9 Nutrition - alb 3, NPO. multivit 10 Seizures- on dilantin 11 MR / mutism 12 DNR  Plan: HD again today, decrease volume.  Family wants routine medical care regarding access.  Routine care with gram negative cath sepsis would be catheter removal and replacement in 2-3 days.  Will ask [facility] to see pt in this regard , also get there opinion on any future perm access options (when infection is cleared) besides R groin HD cath.  Also have d/w primary, we can give Fortaz at HD tiw for antibiotic coverage and we will change him over to this for pseudomonas coverage.   RKelly SplinterMD pager [phone]   cell [phone] 05/06/2014, 10:03 AM     Recent Labs Lab 05/04/14 0533 05/05/14 0500 05/06/14 0250  NA 142 142 140  K 5.9* 6.1* 4.8  CL 102 101 98  CO2 20 21 25   GLUCOSE 113* 141* 140*  BUN 49* 60* 23  CREATININE 8.58* 8.89* 4.14*  CALCIUM 7.7* 7.3*  7.8*    Recent Labs Lab 04/29/14 1850 05/02/14 1639  AST 35 28  ALT 34 <5  ALKPHOS 144* 125*  BILITOT 0.3 0.8  PROT 6.9 6.2  ALBUMIN 3.5 3.0*    Recent Labs Lab 04/29/14 1850  05/03/14 0230 05/04/14 0830 05/05/14 0500 05/06/14 0250  WBC 7.1  < >  --  15.0* 15.4* 13.0*  NEUTROABS 5.0  --  11.0*  --   --   --   HGB 9.4*  < >  --  7.7* 8.4* 8.5*  HCT 30.2*  < >  --  24.0* 26.6* 26.0*  MCV 94.4  < >  --  92.7 95.0 89.3  PLT 132*  < >  --  92* 96* 113*  < > = values in this interval not displayed. . cinacalcet  30 mg Oral BID WC  . darbepoetin (ARANESP) injection - DIALYSIS  100 mcg Intravenous Q Thu-HD  . doxercalciferol  10 mcg Intravenous Q T,Th,Sa-HD  . hydrocortisone  50 mg Oral Q6H  . insulin aspart  0-9 Units Subcutaneous 6 times per day  . midodrine  15 mg Oral TID  . multivitamin  1 tablet Oral  QHS  . pantoprazole  40 mg Oral Daily  . phenytoin  200 mg Oral QHS  . sevelamer carbonate  800 mg Oral TID WC  . sodium chloride  3 mL Intravenous Q12H     acetaminophen, acetaminophen

## 2014-05-07 DIAGNOSIS — N186 End stage renal disease: Secondary | ICD-10-CM

## 2014-05-07 DIAGNOSIS — A419 Sepsis, unspecified organism: Secondary | ICD-10-CM

## 2014-05-07 DIAGNOSIS — A4159 Other Gram-negative sepsis: Secondary | ICD-10-CM

## 2014-05-07 LAB — WOUND CULTURE

## 2014-05-07 LAB — GLUCOSE, CAPILLARY
GLUCOSE-CAPILLARY: 68 mg/dL — AB (ref 70–99)
GLUCOSE-CAPILLARY: 129 mg/dL — AB (ref 70–99)
Glucose-Capillary: 105 mg/dL — ABNORMAL HIGH (ref 70–99)
Glucose-Capillary: 93 mg/dL (ref 70–99)

## 2014-05-07 LAB — PROCALCITONIN: Procalcitonin: >175 ng/mL

## 2014-05-07 MED ORDER — HYDROCORTISONE 20 MG PO TABS
50.0000 mg | ORAL_TABLET | Freq: Two times a day (BID) | ORAL | Status: DC
  Administered 2014-05-08: 50 mg via ORAL
  Filled 2014-05-07 (×5): qty 1

## 2014-05-07 MED ORDER — GLUCOSE 40 % PO GEL
ORAL | Status: AC
  Filled 2014-05-07: qty 1

## 2014-05-07 MED ORDER — GLUCOSE 40 % PO GEL
1.0000 | Freq: Once | ORAL | Status: AC
  Administered 2014-05-07: 37.5 g via ORAL

## 2014-05-07 NOTE — Progress Notes (Signed)
Patient ID: [name], male   DOB: [birth year], 63 y.o.   MRN: [id number] Attending physician note: No acute change in condition. Blood pressure has stabilized. Afebrile day for antibiotics for Pseudomonas sepsis likely related to infected right femoral dialysis catheter.  Input from nephrology and palliative care services greatly appreciated. Family would like us to pursue all aggressive measures short of intubation and mechanical ventilation. We will respect their wishes as long as it is possible to do so. Thrombolytic therapy was able to get catheter working again. However, in view of the Pseudomonas infection, current catheter will be replaced over a wire with a new catheter tomorrow. The patient is sleepy but easily arousable. Anterior chest is clear. Regular rhythm normal rate. Abdomen nontender. Extremities no edema. Continue current management plan.

## 2014-05-07 NOTE — Consult Note (Signed)
[facility] [place]  Patient name: [name] MRN: [id number] DOB: 01-Aug-1951 Sex: male  REASON FOR CONSULT: Remove Right Femoral Diatek for infection, Place new Femoral catheter Tuesday, and evaluate for long term access. Consult from Dr. Jonnie Finner.  HPI: [name] is a 63 y.o. male who was admitted on 05/02/2014 with sepsis related to his femoral dialysis catheter. He was diagnosed with Pseudomonas bacteremia secondary to an infected right groin catheter. Of note, his right femoral catheter had been changed over a guidewire by Dr. Kellie Simmering on 04/29/2014. This was done because he had exposed cuff outside of the skin.  He has a complicated vascular access history. As documented in the note on 01/10/2013 I. Dr. 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 His right thigh AV graft was placed in February 2012. He had a left thigh AV graft placed in April of 2014 by Dr. Trula Slade. He had thrombolysis of his left thigh AV graft by interventional radiology on 12/28/2013.   [facility] has met with the family and at this point they do not want to sit limits on his medical care other than him being DNR.  He has problems with chronic hypotension.  He was dialyzed yesterday with his right femoral catheter.   Past Medical History  Diagnosis Date  . Heart murmur, systolic 28/78/6767  . Syncope 05/07/2009  . 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 Esophageal varices 11/07/2008  . Gastric ulcer 10/09    antral, with h pylori positive  . Congestive heart failure 03/06/2008  . Cellulitis and abscess of leg, except foot 03/06/2008  . Secondary hyperparathyroidism 02/02/2008  . Mute 02/02/2008  . Hyperlipidemia 02/02/2008  . Anemia 02/02/2008  . ESRD (end stage renal disease)     TTS hemodialysis  . GERD (gastroesophageal reflux  disease)   . Hypertension 02/02/2008    in history  . Mental retardation 02/02/2008  . Mute   . Seizures     "non in a while at home". none in past year .  Marland Kitchen Complication of anesthesia     in the past BP has dropped   . Sepsis due to Pseudomonas species 05/05/2014   History reviewed. No pertinent family history.  SOCIAL HISTORY: History  Substance Use Topics  . Smoking status: Never Smoker   . Smokeless tobacco: Never Used  . Alcohol Use: No   No Known Allergies  REVIEW OF SYSTEMS: Unable to obtain  PHYSICAL EXAM: Filed Vitals:   05/07/14 0354 05/07/14 0400 05/07/14 0600 05/07/14 0740  BP: _0 104/75  Pulse: 91   90  Temp: 97.6 F (36.4 C)   97.7 F (36.5 C)  TempSrc: Oral   Oral  Resp:    18  Height:      Weight:      SpO2: 93%   95%   Body mass index is 19.29 kg/(m^2). GENERAL: The patient is a well-nourished male, in no acute distress. The vital signs are documented above. CARDIOVASCULAR: There is a regular rate and rhythm. I cannot palpate pedal pulses. PULMONARY: There is good air exchange bilaterally without wheezing or rales. ABDOMEN: Soft and non-tender with normal pitched bowel sounds.  MUSCULOSKELETAL: There are no major deformities or cyanosis. He has had multiple grafts in both upper extremities and also his had thigh grafts bilaterally as described above.  DATA:  Lab Results  Component Value Date   WBC 13.0* 05/06/2014   HGB 8.5* 05/06/2014   HCT 26.0* 05/06/2014   MCV 89.3 05/06/2014   PLT 113* 05/06/2014   Lab Results  Component Value Date   NA 140 05/06/2014   K 4.8 05/06/2014   CL 98 05/06/2014   CO2 25 05/06/2014   Lab Results  Component Value Date   CREATININE 4.14* 05/06/2014   Lab Results  Component Value Date   INR 1.08 04/29/2014   INR 1.13 10/02/2013   INR 1.00 09/14/2013   Lab Results  Component Value Date   HGBA1C 5.5 12/22/2012   CBG (last 3)   Recent Labs  05/06/14 1930 05/06/14 2341 05/07/14 0353  GLUCAP  134* 102* 105*   MICROBIOLOGY: The patient had positive blood cultures for Pseudomonas on 05/03/2014. In addition, his right groin exit site grew Pseudomonas.  MEDICAL ISSUES:  END-STAGE RENAL DISEASE: And I removed his right femoral Diatek catheter at the bedside with all difficulty. This had only been placed a little over a week ago. Pressure was held for hemostasis and there was no immediate complications noted. He will be scheduled for placement of a new thigh graft on Tuesday morning. He will require placement of a right or left femoral catheter given his known central venous occlusions. We have also been asked to evaluate him for a new thigh graft. I do not think that he is a candidate for a new thigh graft. He has problems with chronic hypotension. He has had previous thigh grafts on both sides. Despite the risk of infection with the catheter alone, he would also be at significant risk for graft infection if a redo thigh graft is placed. I have known the patient for many years. In my opinion, I would not recommend placement of a new thigh graft.  [place] [facility] [place]: [phone]

## 2014-05-07 NOTE — Progress Notes (Signed)
  [facility] Progress Note   Subjective: No complaints. 2.8 kg off with HD yest  Filed Vitals:   05/07/14 0400 05/07/14 0600 05/07/14 0740 05/07/14 1102  BP: 96/55 91/53 104/75 92/52  Pulse:   90 86  Temp:   97.7 F (36.5 C) 97.4 F (36.3 C)  TempSrc:   Oral Oral  Resp:   18 16  Height:      Weight:      SpO2:   95% 96%   Exam: Awake and alert, mute No jvd Chest clear bilat RRR no MRG ABd soft, NTND, no ascites No LE or UE edema Neuro is nf, Ox 3 R femoral tunneled HD cath   D[facility]: [facility]  3hr 45mins 56kgs 160 400/1.5 2k/2ca Heparin 3500 R fem cath  hectorol 11 mcg IV/HD Arenesp 100 q week Venofer  Assessment:  1 Pseudomonas bacteremia due to infected R groin HD cath- cath removed 7/18 and is to be replaced on Tuesday; on IV abx; on IV Fortaz 2 Perm access- 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, per VVS not a candidate for a thigh AVG; appreciate VVS assistance 2 ESRD on HD TTS 4 Hypotension- chronic issue on midodrine 5 Volume excess- resolved 6 Adrenal insufficiency- on HC 7 Anemia on aranesp 8 HPTH cont vit D, renagel, phos 4.0 9 Nutrition - alb 3, NPO. multivit 10 Seizures- on dilantin 11 MR / mutism 12 DNR  Plan: next HD prob Tuesday after replacement of tunneled HD cath, cont abx  Vinson Moselleob Kyon Bentler MD pager [phone]    cell [phone]  05/07/2014, 1:48 PM     Recent Labs Lab 05/04/14 0533 05/05/14 0500 05/06/14 0250  NA 142 142 140  K 5.9* 6.1* 4.8  CL 102 101 98  CO2 20 21 25   GLUCOSE 113* 141* 140*  BUN 49* 60* 23  CREATININE 8.58* 8.89* 4.14*  CALCIUM 7.7* 7.3* 7.8*  PHOS  --   --  4.0    Recent Labs Lab 05/02/14 1639  AST 28  ALT <5  ALKPHOS 125*  BILITOT 0.8  PROT 6.2  ALBUMIN 3.0*    Recent Labs Lab 05/03/14 0230 05/04/14 0830 05/05/14 0500 05/06/14 0250  WBC  --  15.0* 15.4* 13.0*  NEUTROABS 11.0*  --   --   --   HGB  --  7.7* 8.4* 8.5*  HCT  --  24.0* 26.6* 26.0*  MCV  --  92.7 95.0 89.3   PLT  --  92* 96* 113*   . cefTAZidime (FORTAZ)  IV  2 g Intravenous Q T,Th,Sa-HD  . cinacalcet  30 mg Oral BID WC  . darbepoetin (ARANESP) injection - DIALYSIS  100 mcg Intravenous Q Thu-HD  . doxercalciferol  10 mcg Intravenous Q T,Th,Sa-HD  . hydrocortisone  50 mg Oral BID  . insulin aspart  0-9 Units Subcutaneous 6 times per day  . midodrine  15 mg Oral TID  . multivitamin  1 tablet Oral QHS  . pantoprazole  40 mg Oral Daily  . phenytoin  200 mg Oral QHS  . sevelamer carbonate  800 mg Oral TID WC  . sodium chloride  3 mL Intravenous Q12H     acetaminophen, acetaminophen

## 2014-05-07 NOTE — Progress Notes (Signed)
SLP Cancellation Note  Patient Details Name: [name] MRN: [id number] DOB: May 15, 1951   Cancelled treatment: ST received order for BSE.  Attempt x1 this am but not completed as patient presenting with lethargy with inability to participate fully.  ST to continue attempts.  RN reports decreased PO intake. Moreen FowlerKaren Seara Hinesley MS, CCC-SLP [phone] [facility] Medical CenterDANKOF,[name] 05/07/2014, 9:16 AM

## 2014-05-07 NOTE — Progress Notes (Signed)
Subjective: [name] Pt had right HD pulled by VS in anticipation of new HD cath placement on Tuesday. There is still consensus given pt comorbidties that attempting graft redo or placement would be harmful to the patient. Pt is more somnolent this AM and not engaging.    Objective: Vital signs in last 24 hours: Filed Vitals:   05/07/14 0354 05/07/14 0400 05/07/14 0600 05/07/14 0740  BP: 90/53 96/55 91/53  104/75  Pulse: 91   90  Temp: 97.6 F (36.4 C)   97.7 F (36.5 C)  TempSrc: Oral   Oral  Resp:    18  Height:      Weight:      SpO2: 93%   95%   Weight change: -15 lb 6.9 oz (-7 kg)  Intake/Output Summary (Last 24 hours) at 05/07/14 1004 Last data filed at 05/07/14 1002  Gross per 24 hour  Intake      3 ml  Output   2856 ml  Net  -2853 ml   BP 104/75  Pulse 90  Temp(Src) 97.7 F (36.5 C) (Oral)  Resp 18  Ht 5' 4"  (1.626 m)  Wt 112 lb 7 oz (51 kg)  BMI 19.29 kg/m2  SpO2 95%  General Appearance:    Sleeping will open eyes to sound but not engaging and drifting back to sleep   HEENT:     moist mucous membranes  Lungs:     Very difficult to appreciate lung sounds as he was noncooperative, respirations unlabored.  Heart:    Regular rate and rhythm, S1 and S2 normal, 2/6 systolic ejection murmur loudest at apex  Abdomen:     Soft, non-tender, bowel sounds active all four quadrants,    no masses, no organomegaly  Extremities:   R groin with bandage c/d/i  Pulses:   2+ and symmetric all extremities   Lab Results: Basic Metabolic Panel:  Recent Labs Lab 05/05/14 0500 05/06/14 0250  NA 142 140  K 6.1* 4.8  CL 101 98  CO2 21 25  GLUCOSE 141* 140*  BUN 60* 23  CREATININE 8.89* 4.14*  CALCIUM 7.3* 7.8*  PHOS  --  4.0   Liver Function Tests:  Recent Labs Lab 05/02/14 1639  AST 28  ALT <5  ALKPHOS 125*  BILITOT 0.8  PROT 6.2  ALBUMIN 3.0*    Recent Labs Lab 05/02/14 1639  AMMONIA 40   CBC:  Recent Labs Lab 05/03/14 0230  05/05/14 0500  05/06/14 0250  WBC  --   < > 15.4* 13.0*  NEUTROABS 11.0*  --   --   --   HGB  --   < > 8.4* 8.5*  HCT  --   < > 26.6* 26.0*  MCV  --   < > 95.0 89.3  PLT  --   < > 96* 113*  < > = values in this interval not displayed. Cardiac Enzymes:  Recent Labs Lab 05/03/14 0107 05/03/14 0908 05/03/14 1259  TROPONINI 0.88* 0.79* 1.10*   CBG:  Recent Labs Lab 05/06/14 0349 05/06/14 0749 05/06/14 1526 05/06/14 1930 05/06/14 2341 05/07/14 0353  GLUCAP 128* 141* 83 134* 102* 105*   Coagulation: No results found for this basename: LABPROT, INR,  in the last 168 hours Micro Results: Recent Results (from the past 240 hour(s))  CULTURE, BLOOD (ROUTINE X 2)     Status: None   Collection Time    05/02/14  4:39 PM      Result Value Ref Range  Status   Specimen Description BLOOD NECK   Final   Special Requests BOTTLES DRAWN AEROBIC AND ANAEROBIC 5 CC   Final   Culture  Setup Time     Final   Value: 05/02/2014 22:38     Performed at [facility]   Culture     Final   Value: PSEUDOMONAS AERUGINOSA     Note: SUSCEPTIBILITIES PERFORMED ON PREVIOUS CULTURE WITHIN THE LAST 5 DAYS.     Note: Gram Stain Report Called to,Read Back By and Verified With: Rico Sheehan RN [phone]     Performed at [facility]   Report Status 05/06/2014 FINAL   Final  CULTURE, BLOOD (ROUTINE X 2)     Status: None   Collection Time    05/02/14 11:25 PM      Result Value Ref Range Status   Specimen Description BLOOD RIGHT ARM   Final   Special Requests BOTTLES DRAWN AEROBIC ONLY 5CC   Final   Culture  Setup Time     Final   Value: 05/03/2014 08:46     Performed at [facility]   Culture     Final   Value: PSEUDOMONAS AERUGINOSA     1245 Note: Gram Stain Report Called to,Read Back By and Verified With: [facility] 05/04/14 [place]     Performed at [facility]   Report Status 05/06/2014 FINAL   Final   Organism ID, Bacteria PSEUDOMONAS AERUGINOSA   Final  CULTURE, BLOOD  (SINGLE)     Status: None   Collection Time    05/03/14  8:52 AM      Result Value Ref Range Status   Specimen Description BLOOD RIGHT ANTECUBITAL   Final   Special Requests BOTTLES DRAWN AEROBIC ONLY 2CC   Final   Culture  Setup Time     Final   Value: 05/03/2014 13:23     Performed at [facility]   Culture     Final   Value: PSEUDOMONAS AERUGINOSA     Note: SUSCEPTIBILITIES PERFORMED ON PREVIOUS CULTURE WITHIN THE LAST 5 DAYS.     Note: Gram Stain Report Called to,Read Back By and Verified With: STEPHANIE SHAW 05/04/14 1000 BY SMITHERSJ     Performed at [facility]   Report Status 05/06/2014 FINAL   Final  WOUND CULTURE     Status: None   Collection Time    05/04/14 10:48 AM      Result Value Ref Range Status   Specimen Description WOUND RIGHT GROIN   Final   Special Requests NONE   Final   Gram Stain     Final   Value: RARE WBC PRESENT,BOTH PMN AND MONONUCLEAR     NO SQUAMOUS EPITHELIAL CELLS SEEN     NO ORGANISMS SEEN     Performed at [facility]   Culture     Final   Value: FEW PSEUDOMONAS AERUGINOSA     Performed at [facility]   Report Status 05/07/2014 FINAL   Final   Organism ID, Bacteria PSEUDOMONAS AERUGINOSA   Final  CULTURE, BLOOD (SINGLE)     Status: None   Collection Time    05/05/14  6:50 PM      Result Value Ref Range Status   Specimen Description BLOOD RIGHT HAND   Final   Special Requests BOTTLES DRAWN AEROBIC ONLY 3CC   Final   Culture  Setup Time     Final   Value: 05/05/2014  21:57     Performed at [facility]     Final   Value:        BLOOD CULTURE RECEIVED NO GROWTH TO DATE CULTURE WILL BE HELD FOR 5 DAYS BEFORE ISSUING A FINAL NEGATIVE REPORT     Performed at [facility]   Report Status PENDING   Incomplete   Studies/Results: 05/04/14 EKG: NSR, no changes, no peaked T waves  Medications: I have reviewed the patient's current medications. Scheduled Meds: . cefTAZidime (FORTAZ)  IV  2  g Intravenous Q T,Th,Sa-HD  . cinacalcet  30 mg Oral BID WC  . darbepoetin (ARANESP) injection - DIALYSIS  100 mcg Intravenous Q Thu-HD  . doxercalciferol  10 mcg Intravenous Q T,Th,Sa-HD  . hydrocortisone  50 mg Oral 4 times per day  . insulin aspart  0-9 Units Subcutaneous 6 times per day  . midodrine  15 mg Oral TID  . multivitamin  1 tablet Oral QHS  . pantoprazole  40 mg Oral Daily  . phenytoin  200 mg Oral QHS  . sevelamer carbonate  800 mg Oral TID WC  . sodium chloride  3 mL Intravenous Q12H   Continuous Infusions:  PRN Meds:.acetaminophen, acetaminophen Assessment/Plan:  Sepsis 2/2 Proteus bacteremia: Pt originally met all SIRS criteria.This infection may be due to his recently displaced R groin HD catheter although the site does not look infected. PCCM was consulted when pt continued to be hypotensive and given access issues will not be able to provide pressor support despite HCPOA wishes to continue with pressors this continues to be an issue especially since the patient lost all PIV access at this time as well. Dr. Kellie Simmering of vascular surgery evaluated the patient and felt the catheter should only be removed as last resort.  -appreciate vascular surgery, critical care, palliative care input  -f/u Blood cultures sensitivities -zosyn 2.25 g q8h>> was transitioned to oral levaquin q48h>>IV ceftazidime with HD and repeat BCx planned on 7/21 after new HD cath placed to determine treatment duration -midodrine 15 mg TID  ESRD on HD: K+ now normalized in setting of having successful HD treatment. There is still grave concern that HD cath may become nonfunctional at any point which will terminate pt ability for continued HD per nephrology.  -appreciate renal and vascular input  -I/Os  -TTS HD  -vascular surgery consulted and do not recommend and will not be pursing new graft access pt has truly reached his end access and will have new HD on 7/21  -Continue Cinacalcet, Dialyvite, Renvela    Acquired Adrenal Insufficiency: Patient has documented adrenal insufficiency, on Midodrine 15 mg tid at home. Was discharged on 10 day prednisone taper which was not completed.  Given Solu-Cortef 100 IV twice. Will likely need to be continued on stress-dosed steroids to maintain BP. Hypotension likely contributed to by adrenal insufficiency.  -Solu-Cortef 50 mg IV Q6H>> transition to po cortef will begin titrating   Hypotension: Patient is chronically hypotensive with unknown etiology but likely due to acquired adrenal insufficiency v acute shock. On midodrine 15 mg TID.   -Continue midodrine 15 mg TID  -palliative support if becomes symptomatic   History of seizures: No recent seizures. Unlikely that acute mental status change was a result of seizure since pt has respiratory distress and hypotension. Pt back at mental status baseline. On phenytoin 200 mg daily at home. Dilantin level was undetectable in ED.  -cont dilantin  -Seizure precautions  -Neuro checks  Mental retardation: Patient appears back at baseline from chart review and per family. Non-verbal, but grunts. He maintained intermittent eye contact with team.   Dispo: Disposition is deferred at this time, awaiting improvement of current medical problems.  Anticipated discharge in approximately 3 day(s).   The patient does have a current PCP Corky Sox, MD) and does need an [facility] hospital follow-up appointment after discharge.  The patient does have transportation limitations that hinder transportation to clinic appointments.  .Services Needed at time of discharge: Y = Yes, Blank = No PT:   OT:   RN:   Equipment:   Other:     LOS: 5 days   Clinton Gallant, MD 05/07/2014, 10:04 AM

## 2014-05-07 NOTE — Progress Notes (Signed)
Hypoglycemic Event  CBG: 68  Treatment:   Symptoms: None  Follow-up CBG: Time: CBG Result:  Possible Reasons for Event:   Comments/MD notified:Teaching Service paged,  Pt refused to take anything orally.  Pt doesn't have any IV access.  No intervention able to be performed at this time.  Dr Leatha GildingMallory called nurse back, acknowledged result and that no protocol interventions could be performed at this time, MD did not order any additional interventions.  instructed nurse to continue to monitor pt.    [name], [name]  Remember to initiate Hypoglycemia Order Set & complete

## 2014-05-08 LAB — GLUCOSE, CAPILLARY
GLUCOSE-CAPILLARY: 108 mg/dL — AB (ref 70–99)
GLUCOSE-CAPILLARY: 154 mg/dL — AB (ref 70–99)
GLUCOSE-CAPILLARY: 71 mg/dL (ref 70–99)
GLUCOSE-CAPILLARY: 74 mg/dL (ref 70–99)
Glucose-Capillary: 88 mg/dL (ref 70–99)
Glucose-Capillary: 94 mg/dL (ref 70–99)
Glucose-Capillary: 83 mg/dL (ref 70–99)
Glucose-Capillary: 70 mg/dL (ref 70–99)

## 2014-05-08 MED ORDER — CEFAZOLIN SODIUM 1-5 GM-% IV SOLN
1.0000 g | Freq: Once | INTRAVENOUS | Status: AC
  Administered 2014-05-09: 1 g via INTRAVENOUS
  Filled 2014-05-08 (×2): qty 50

## 2014-05-08 NOTE — Progress Notes (Signed)
Pt reoriented frequently but continues to refuse care - will not eat and swats at RN when attempting to feed or administer medications.  MD aware and pt's sister also updated.  Will continue to attempt to provide nutrition - pt currently watching tv.  Will continue to closely monitor.

## 2014-05-08 NOTE — Progress Notes (Signed)
  PROGRESS NOTE MEDICINE TEACHING ATTENDING   Day 6 of stay Patient name: [name]   Medical record number: [id number] Date of birth: July 04, 1951    I met with Mr Stock during morning rounds with my team. He appears to be refusing food today, however appears more alert today. I have reviewed his chart and I hope he is able to get dialysis today. He has also been seen by Dr Hilma Favors today, as per the plan of opting for the palliative course if there is no access left for HD. Agree with plan to get additional help to supervise patient after discharge so he does not pull his catheter again which is his only and last hope for HD. As per pseudomonal bacteremia, he is on IV ceftazidime, the patient has not been febrile and vitals have been stable. Duration of antibiotics for pseudomonal bacteremia could be 7-14 days depending on patient profile. Endocarditis is uncommon with this bacteremia. IV antibiotics can be continued with HD - if HD is possible.   I have discussed the care of this patient with my IM team residents. Please see the resident note for details.  [place], [place] 05/08/2014, 12:06 PM.

## 2014-05-08 NOTE — Progress Notes (Signed)
Subjective: [name]. Azeez was sleeping in bed but aroused easily. He then was engaging, maintained eye contact, and was cooperative by his standards with exam. He had right HD pulled by VS in anticipation of new HD cath placement on Tuesday. There is still consensus given pt comorbidties that attempting graft redo or placement would be harmful to the patient. When visited with entire team, he pushed away food when offered. Nurses say this is typical of his actions this morning.  Objective: Vital signs in last 24 hours: Filed Vitals:   05/08/14 0400 05/08/14 0600 05/08/14 0727 05/08/14 0800  BP: 104/56 112/60 113/90 104/55  Pulse:   97 86  Temp:   97.6 F (36.4 C)   TempSrc:   Oral   Resp:   18   Height:      Weight:      SpO2:   95% 92%   Weight change: 6 lb 9.8 oz (3 kg)  Intake/Output Summary (Last 24 hours) at 05/08/14 1002 Last data filed at 05/07/14 1300  Gross per 24 hour  Intake      0 ml  Output      0 ml  Net      0 ml   BP 104/55  Pulse 86  Temp(Src) 97.6 F (36.4 C) (Oral)  Resp 18  Ht 5' 4"  (1.626 m)  Wt 124 lb 12.5 oz (56.6 kg)  BMI 21.41 kg/m2  SpO2 92%  General Appearance:    Originally sleep but easily arousable and engaging, cooperative with exam by his standards  HEENT:     moist mucous membranes  Lungs:     Very difficult to appreciate lung sounds as he was noncooperative, respirations unlabored.  Heart:    Regular rate and rhythm, S1 and S2 normal, 2/6 systolic ejection murmur loudest at apex  Abdomen:     Soft, non-tender, bowel sounds active all four quadrants,    no masses, no organomegaly  Extremities:   R groin with bandage c/d/i  Pulses:   2+ and symmetric all extremities   Lab Results: Basic Metabolic Panel:  Recent Labs Lab 05/05/14 0500 05/06/14 0250  NA 142 140  K 6.1* 4.8  CL 101 98  CO2 21 25  GLUCOSE 141* 140*  BUN 60* 23  CREATININE 8.89* 4.14*  CALCIUM 7.3* 7.8*  PHOS  --  4.0   Liver Function Tests:  Recent  Labs Lab 05/02/14 1639  AST 28  ALT <5  ALKPHOS 125*  BILITOT 0.8  PROT 6.2  ALBUMIN 3.0*    Recent Labs Lab 05/02/14 1639  AMMONIA 40   CBC:  Recent Labs Lab 05/03/14 0230  05/05/14 0500 05/06/14 0250  WBC  --   < > 15.4* 13.0*  NEUTROABS 11.0*  --   --   --   HGB  --   < > 8.4* 8.5*  HCT  --   < > 26.6* 26.0*  MCV  --   < > 95.0 89.3  PLT  --   < > 96* 113*  < > = values in this interval not displayed. Cardiac Enzymes:  Recent Labs Lab 05/03/14 0107 05/03/14 0908 05/03/14 1259  TROPONINI 0.88* 0.79* 1.10*   CBG:  Recent Labs Lab 05/07/14 1100 05/07/14 1538 05/07/14 2043 05/07/14 2342 05/08/14 0347 05/08/14 0723  GLUCAP 68* 129* 94 88 83 70   Coagulation: No results found for this basename: LABPROT, INR,  in the last 168 hours Micro Results: Recent Results (from the  past 240 hour(s))  CULTURE, BLOOD (ROUTINE X 2)     Status: None   Collection Time    05/02/14  4:39 PM      Result Value Ref Range Status   Specimen Description BLOOD NECK   Final   Special Requests BOTTLES DRAWN AEROBIC AND ANAEROBIC 5 CC   Final   Culture  Setup Time     Final   Value: 05/02/2014 22:38     Performed at [facility]   Culture     Final   Value: PSEUDOMONAS AERUGINOSA     Note: SUSCEPTIBILITIES PERFORMED ON PREVIOUS CULTURE WITHIN THE LAST 5 DAYS.     Note: Gram Stain Report Called to,Read Back By and Verified With: Rico Sheehan RN [phone]     Performed at [facility]   Report Status 05/06/2014 FINAL   Final  CULTURE, BLOOD (ROUTINE X 2)     Status: None   Collection Time    05/02/14 11:25 PM      Result Value Ref Range Status   Specimen Description BLOOD RIGHT ARM   Final   Special Requests BOTTLES DRAWN AEROBIC ONLY 5CC   Final   Culture  Setup Time     Final   Value: 05/03/2014 08:46     Performed at [facility]   Culture     Final   Value: PSEUDOMONAS AERUGINOSA     1245 Note: Gram Stain Report Called to,Read Back By and  Verified With: [facility] 05/04/14 [place]     Performed at [facility]   Report Status 05/06/2014 FINAL   Final   Organism ID, Bacteria PSEUDOMONAS AERUGINOSA   Final  CULTURE, BLOOD (SINGLE)     Status: None   Collection Time    05/03/14  8:52 AM      Result Value Ref Range Status   Specimen Description BLOOD RIGHT ANTECUBITAL   Final   Special Requests BOTTLES DRAWN AEROBIC ONLY 2CC   Final   Culture  Setup Time     Final   Value: 05/03/2014 13:23     Performed at [facility]   Culture     Final   Value: PSEUDOMONAS AERUGINOSA     Note: SUSCEPTIBILITIES PERFORMED ON PREVIOUS CULTURE WITHIN THE LAST 5 DAYS.     Note: Gram Stain Report Called to,Read Back By and Verified With: STEPHANIE SHAW 05/04/14 1000 BY SMITHERSJ     Performed at [facility]   Report Status 05/06/2014 FINAL   Final  WOUND CULTURE     Status: None   Collection Time    05/04/14 10:48 AM      Result Value Ref Range Status   Specimen Description WOUND RIGHT GROIN   Final   Special Requests NONE   Final   Gram Stain     Final   Value: RARE WBC PRESENT,BOTH PMN AND MONONUCLEAR     NO SQUAMOUS EPITHELIAL CELLS SEEN     NO ORGANISMS SEEN     Performed at [facility]   Culture     Final   Value: FEW PSEUDOMONAS AERUGINOSA     Performed at [facility]   Report Status 05/07/2014 FINAL   Final   Organism ID, Bacteria PSEUDOMONAS AERUGINOSA   Final  CULTURE, BLOOD (SINGLE)     Status: None   Collection Time    05/05/14  6:50 PM      Result Value Ref Range Status  Specimen Description BLOOD RIGHT HAND   Final   Special Requests BOTTLES DRAWN AEROBIC ONLY 3CC   Final   Culture  Setup Time     Final   Value: 05/05/2014 21:57     Performed at [facility]   Culture     Final   Value:        BLOOD CULTURE RECEIVED NO GROWTH TO DATE CULTURE WILL BE HELD FOR 5 DAYS BEFORE ISSUING A FINAL NEGATIVE REPORT     Performed at [facility]   Report Status  PENDING   Incomplete  CATH TIP CULTURE     Status: None   Collection Time    05/07/14  9:01 AM      Result Value Ref Range Status   Specimen Description CATH TIP RIGHT FEMORAL [place] CATH TIP   Final   Special Requests Normal   Final   Culture     Final   Value: NO GROWTH 1 DAY     Performed at [facility]   Report Status PENDING   Incomplete   Studies/Results: 05/04/14 EKG: NSR, no changes, no peaked T waves  Medications: I have reviewed the patient's current medications. Scheduled Meds: . [START ON 05/09/2014]  ceFAZolin (ANCEF) IV  1 g Intravenous Once  . cefTAZidime (FORTAZ)  IV  2 g Intravenous Q T,Th,Sa-HD  . cinacalcet  30 mg Oral BID WC  . darbepoetin (ARANESP) injection - DIALYSIS  100 mcg Intravenous Q Thu-HD  . doxercalciferol  10 mcg Intravenous Q T,Th,Sa-HD  . hydrocortisone  50 mg Oral BID  . midodrine  15 mg Oral TID  . multivitamin  1 tablet Oral QHS  . pantoprazole  40 mg Oral Daily  . phenytoin  200 mg Oral QHS  . sevelamer carbonate  800 mg Oral TID WC  . sodium chloride  3 mL Intravenous Q12H   Continuous Infusions:  PRN Meds:.acetaminophen, acetaminophen Assessment/Plan:  Sepsis 2/2 Pseudomonas bacteremia: Pt originally met all SIRS criteria.This infection may be due to his recently displaced R groin HD catheter although the site does not look infected. PCCM was consulted when pt continued to be hypotensive and given access issues will not be able to provide pressor support despite HCPOA wishes to continue with pressors this continues to be an issue especially since the patient lost all PIV access at this time as well. Dr. Kellie Simmering of vascular surgery evaluated the patient and felt the catheter should only be removed as last resort. He had his R femoral HD cath removed and it will be replaced tomorrow with HD. Patient currently on ceftazidime 2 g w HD T/Th/Sa for pseudomonas. Afebrile overnight. -appreciate vascular surgery, critical care, palliative care  input  -zosyn 2.25 g q8h>> was transitioned to oral levaquin q48h>>IV ceftazidime with HD and repeat BCx planned on 7/21 after new HD cath placed to determine treatment duration -midodrine 15 mg TID  ESRD on HD: K+ now normalized in setting of having successful HD treatment. There is still grave concern that HD cath may become nonfunctional at any point which will terminate pt ability for continued HD per nephrology.  -appreciate renal and vascular input  -I/Os  -TTS HD  -vascular surgery consulted and do not recommend and will not be pursing new graft access pt has truly reached his end access and will have new HD on 7/21  -Continue Cinacalcet, Dialyvite, Renvela   Acquired Adrenal Insufficiency: Patient has documented adrenal insufficiency, on Midodrine 15 mg tid at  home. Was discharged on 10 day prednisone taper which was not completed.  Given Solu-Cortef 100 IV twice. Will likely need to be continued on stress-dosed steroids to maintain BP. Hypotension likely contributed to by adrenal insufficiency.  -hydrocortisone 50 mg po bid>>50 mg po daily  Hypotension: Patient is chronically hypotensive with unknown etiology but likely due to acquired adrenal insufficiency v acute shock. His BP overnight/this morning was 80s-100s/50s-60s.On midodrine 15 mg TID. Steroids can be tapered as BP is normal/high for him. -Continue midodrine 15 mg TID  -hydrocortisone 50 mg po daily -palliative support if becomes symptomatic   History of seizures: No recent seizures. Unlikely that acute mental status change was a result of seizure since pt has respiratory distress and hypotension. Pt back at mental status baseline. On phenytoin 200 mg daily at home. Dilantin level was undetectable in ED.  -cont dilantin  -Seizure precautions  -Neuro checks   Mental retardation: Patient appears back at baseline from chart review and per family. Non-verbal, but grunts. He maintained intermittent eye contact with team.    Dispo: Disposition is deferred at this time, awaiting improvement of current medical problems.  Anticipated discharge in approximately 3 day(s).   The patient does have a current PCP Corky Sox, MD) and does need an [facility] hospital follow-up appointment after discharge.  The patient does have transportation limitations that hinder transportation to clinic appointments.  .Services Needed at time of discharge: Y = Yes, Blank = No PT:   OT:   RN:   Equipment:   Other:     LOS: 6 days   Kelby Aline, MD 05/08/2014, 10:02 AM

## 2014-05-08 NOTE — Progress Notes (Signed)
SLP Cancellation Note  Patient Details Name: [name] MRN: [id number] DOB: 1950/11/16   Cancelled treatment:       Reason Eval/Treat Not Completed: Patient declined, no reason specified.  SLP attempted Bedside Swallow Evaluation this AM however; via non-verbal communication patient refused PO.  He grunted and swatted at SLP when food or liquid was present.  Attempts to lower side rail and remove mitts for self-feeding were also not successful.  Family will likely need to be present to successfully complete eval.  RN notified and agreeable to call SLP when they arrive.     Fae PippinMelissa Maryse Brierley, M.A., CCC-SLP [phone]  [name] 05/08/2014, 9:03 AM

## 2014-05-08 NOTE — Progress Notes (Signed)
  [facility] Progress Note   Subjective: No significant change- afebrile  Filed Vitals:   05/08/14 0349 05/08/14 0400 05/08/14 0600 05/08/14 0727  BP: 114/54 104/56 112/60 113/90  Pulse: 99   97  Temp: 97.3 F (36.3 C)   97.6 F (36.4 C)  TempSrc: Oral   Oral  Resp:    18  Height:      Weight: 56.6 kg (124 lb 12.5 oz)     SpO2: 92%   95%   Exam: Awake and alert, mute No jvd Chest clear bilat RRR no MRG ABd soft, NTND, no ascites No LE or UE edema Neuro is nf, Ox 3 R femoral tunneled HD cath- now removed   Dialysis: [facility]  3hr 45mins 56kgs 160 400/1.5 2k/2ca Heparin 3500 R fem cath  hectorol 11 mcg IV/HD Arenesp 100 q week Venofer  Assessment:  1 Pseudomonas bacteremia due to infected R groin HD cath- cath removed 7/18 and is to be replaced on Tuesday (I assume vvs will do- if not let us know so can get IR to place) ; on IV abx; on IV Fortaz 2 Perm access- 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, per VVS not a candidate for a thigh AVG; appreciate VVS assistance- wbc coming down 2 ESRD on HD TTS- attempting to keep regular schedule 4 Hypotension- chronic issue on midodrine 5 Volume excess- resolved 6 Adrenal insufficiency- on HC 7 Anemia on aranesp- will check iron stores as has been low in past 8 HPTH cont hectorol, sensipar and renagel, phos 4.0 9 Nutrition - alb 3,  multivit 10 Seizures- on dilantin 11 MR / mutism 12 DNR  Plan: next HD Tuesday after replacement of tunneled HD cath, cont abx  Rozell Kettlewell A    05/08/2014, 8:23 AM     Recent Labs Lab 05/04/14 0533 05/05/14 0500 05/06/14 0250  NA 142 142 140  K 5.9* 6.1* 4.8  CL 102 101 98  CO2 20 21 25   GLUCOSE 113* 141* 140*  BUN 49* 60* 23  CREATININE 8.58* 8.89* 4.14*  CALCIUM 7.7* 7.3* 7.8*  PHOS  --   --  4.0    Recent Labs Lab 05/02/14 1639  AST 28  ALT <5  ALKPHOS 125*  BILITOT 0.8  PROT 6.2  ALBUMIN 3.0*    Recent Labs Lab 05/03/14 0230  05/04/14 0830 05/05/14 0500 05/06/14 0250  WBC  --  15.0* 15.4* 13.0*  NEUTROABS 11.0*  --   --   --   HGB  --  7.7* 8.4* 8.5*  HCT  --  24.0* 26.6* 26.0*  MCV  --  92.7 95.0 89.3  PLT  --  92* 96* 113*   . cefTAZidime (FORTAZ)  IV  2 g Intravenous Q T,Th,Sa-HD  . cinacalcet  30 mg Oral BID WC  . darbepoetin (ARANESP) injection - DIALYSIS  100 mcg Intravenous Q Thu-HD  . doxercalciferol  10 mcg Intravenous Q T,Th,Sa-HD  . hydrocortisone  50 mg Oral BID  . midodrine  15 mg Oral TID  . multivitamin  1 tablet Oral QHS  . pantoprazole  40 mg Oral Daily  . phenytoin  200 mg Oral QHS  . sevelamer carbonate  800 mg Oral TID WC  . sodium chloride  3 mL Intravenous Q12H     acetaminophen, acetaminophen

## 2014-05-08 NOTE — Progress Notes (Signed)
[name] bedside - pt eating well for her.  Continues to refuse to drink anything.  RN attempted to feed pt - noted that he is not chewing his food - more so making a "sucking" motion and then swallowing the items whole - no coughing or choking noted when eating.  Stopped feeding pt pieces of food and fed pt pudding, apple sauce, ice cream and icee.  Pt currently watching tv - family bedside.  Will continue to monitor.

## 2014-05-08 NOTE — Progress Notes (Signed)
Palliative Medicine Team Progress Note  [name] is about the same compared to prior hospitalization- he usually refuses food and his care is more challenging here. Plan is for re-insertion of tunneled femoral cath tomorrow- hopefully he can dialyze and then return to his group home where he is in general more comfortable.  I spoke with Margaretmary LombardJanie Mills by phone. She is requesting assistance in getting more PCS services and supervision for [name] so he does pull at his catheter. Will request PCP contact [facility] and see what we need to do to obtain additional help.  Palliative team will follow- [name] expressed appreciation for the improved communication and team approach to his care.  Anderson MaltaElizabeth Golding, DO Palliative Medicine [phone]

## 2014-05-08 NOTE — Progress Notes (Signed)
   Preoperative Note   Procedure: Femoral tunneled dialysis catheter placement  Date: 05/09/14 (tomorrow)  Preoperative diagnosis:  ESRD, resolving bacteremia  Consent: to be obtained  Laboratory:  CBC:    Component Value Date/Time   WBC 13.0* 05/06/2014 0250   RBC 2.91* 05/06/2014 0250   RBC 2.30* 04/17/2014 0800   HGB 8.5* 05/06/2014 0250   HCT 26.0* 05/06/2014 0250   PLT 113* 05/06/2014 0250   MCV 89.3 05/06/2014 0250   MCH 29.2 05/06/2014 0250   MCHC 32.7 05/06/2014 0250   RDW 25.8* 05/06/2014 0250   LYMPHSABS 0.2* 05/03/2014 0230   MONOABS 0.1 05/03/2014 0230   EOSABS 0 05/03/2014 0230   BASOSABS 0 05/03/2014 0230    BMP:    Component Value Date/Time   NA 140 05/06/2014 0250   K 4.8 05/06/2014 0250   CL 98 05/06/2014 0250   CO2 25 05/06/2014 0250   GLUCOSE 140* 05/06/2014 0250   BUN 23 05/06/2014 0250   CREATININE 4.14* 05/06/2014 0250   CALCIUM 7.8* 05/06/2014 0250   GFRNONAA 14* 05/06/2014 0250   GFRAA 16* 05/06/2014 0250    Coagulation: Lab Results  Component Value Date   INR 1.08 04/29/2014   INR 1.13 10/02/2013   INR 1.00 09/14/2013   No results found for this basename: PTT    EKG: 05/04/14 NSR  CXR: 05/03/14 No active disease  Leonides SakeBrian Chen, MD [facility] [place] Office: [phone] Pager: [phone]  05/08/2014, 9:02 AM

## 2014-05-09 ENCOUNTER — Encounter (HOSPITAL_COMMUNITY): Payer: Medicare Other | Admitting: Certified Registered Nurse Anesthetist

## 2014-05-09 ENCOUNTER — Encounter (HOSPITAL_COMMUNITY)
Admission: EM | Disposition: A | Discharge status: Home / Self Care | Payer: Self-pay | Source: Home / Self Care | Attending: Oncology

## 2014-05-09 ENCOUNTER — Encounter (HOSPITAL_COMMUNITY): Payer: Self-pay | Admitting: Certified Registered Nurse Anesthetist

## 2014-05-09 ENCOUNTER — Inpatient Hospital Stay (HOSPITAL_COMMUNITY): Payer: Medicare Other

## 2014-05-09 ENCOUNTER — Inpatient Hospital Stay (HOSPITAL_COMMUNITY): Payer: Medicare Other | Admitting: Certified Registered Nurse Anesthetist

## 2014-05-09 DIAGNOSIS — N185 Chronic kidney disease, stage 5: Secondary | ICD-10-CM

## 2014-05-09 HISTORY — PX: INSERTION OF DIALYSIS CATHETER: SHX1324

## 2014-05-09 LAB — POCT I-STAT EG7
ACID-BASE EXCESS: 4 mmol/L — AB (ref 0.0–2.0)
Bicarbonate: 29.2 mEq/L — ABNORMAL HIGH (ref 20.0–24.0)
CALCIUM ION: 1.17 mmol/L (ref 1.13–1.30)
HEMATOCRIT: 27 % — AB (ref 39.0–52.0)
Hemoglobin: 9.2 g/dL — ABNORMAL LOW (ref 13.0–17.0)
O2 Saturation: 98 %
PCO2 VEN: 44.3 mmHg — AB (ref 45.0–50.0)
Potassium: 4.5 mEq/L (ref 3.7–5.3)
Sodium: 137 mEq/L (ref 137–147)
TCO2: 31 mmol/L (ref 0–100)
pH, Ven: 7.427 — ABNORMAL HIGH (ref 7.250–7.300)
pO2, Ven: 109 mmHg — ABNORMAL HIGH (ref 30.0–45.0)

## 2014-05-09 LAB — GLUCOSE, CAPILLARY
GLUCOSE-CAPILLARY: 137 mg/dL — AB (ref 70–99)
GLUCOSE-CAPILLARY: 101 mg/dL — AB (ref 70–99)
Glucose-Capillary: 117 mg/dL — ABNORMAL HIGH (ref 70–99)
Glucose-Capillary: 88 mg/dL (ref 70–99)

## 2014-05-09 SURGERY — INSERTION OF DIALYSIS CATHETER
Anesthesia: Monitor Anesthesia Care | Site: Leg Upper | Laterality: Right

## 2014-05-09 MED ORDER — FENTANYL CITRATE 0.05 MG/ML IJ SOLN
25.0000 ug | INTRAMUSCULAR | Status: DC | PRN
  Administered 2014-05-09: 25 ug via INTRAVENOUS

## 2014-05-09 MED ORDER — OXYCODONE HCL 5 MG/5ML PO SOLN: 5.0000 mg | Freq: Once | ORAL | Status: DC | PRN

## 2014-05-09 MED ORDER — ROCURONIUM BROMIDE 50 MG/5ML IV SOLN
INTRAVENOUS | Status: AC
  Filled 2014-05-09: qty 1

## 2014-05-09 MED ORDER — HEPARIN SODIUM (PORCINE) 1000 UNIT/ML IJ SOLN
INTRAMUSCULAR | Status: AC
  Filled 2014-05-09: qty 1

## 2014-05-09 MED ORDER — PHENYLEPHRINE HCL 10 MG/ML IJ SOLN
INTRAMUSCULAR | Status: DC | PRN
  Administered 2014-05-09 (×9): 80 ug via INTRAVENOUS

## 2014-05-09 MED ORDER — PROPOFOL 10 MG/ML IV BOLUS
INTRAVENOUS | Status: DC | PRN
  Administered 2014-05-09: 100 mg via INTRAVENOUS

## 2014-05-09 MED ORDER — HEPARIN SODIUM (PORCINE) 5000 UNIT/ML IJ SOLN
INTRAMUSCULAR | Status: DC | PRN
  Administered 2014-05-09: 08:00:00

## 2014-05-09 MED ORDER — FENTANYL CITRATE 0.05 MG/ML IJ SOLN
INTRAMUSCULAR | Status: AC
  Filled 2014-05-09: qty 5

## 2014-05-09 MED ORDER — SODIUM CHLORIDE 0.9 % IV SOLN
INTRAVENOUS | Status: DC | PRN
  Administered 2014-05-09: 08:00:00 via INTRAVENOUS

## 2014-05-09 MED ORDER — MEPERIDINE HCL 25 MG/ML IJ SOLN: 6.2500 mg | INTRAMUSCULAR | Status: DC | PRN

## 2014-05-09 MED ORDER — PROPOFOL 10 MG/ML IV BOLUS
INTRAVENOUS | Status: AC
  Filled 2014-05-09: qty 20

## 2014-05-09 MED ORDER — EPHEDRINE SULFATE 50 MG/ML IJ SOLN
INTRAMUSCULAR | Status: DC | PRN
  Administered 2014-05-09: 10 mg via INTRAVENOUS
  Administered 2014-05-09: 5 mg via INTRAVENOUS
  Administered 2014-05-09: 10 mg via INTRAVENOUS

## 2014-05-09 MED ORDER — FENTANYL CITRATE 0.05 MG/ML IJ SOLN
INTRAMUSCULAR | Status: AC
  Filled 2014-05-09: qty 2

## 2014-05-09 MED ORDER — HYDROCORTISONE 20 MG PO TABS
50.0000 mg | ORAL_TABLET | Freq: Every day | ORAL | Status: DC
  Filled 2014-05-09: qty 1

## 2014-05-09 MED ORDER — FENTANYL CITRATE 0.05 MG/ML IJ SOLN: 12.5000 ug | Freq: Once | INTRAMUSCULAR | Status: DC

## 2014-05-09 MED ORDER — LIDOCAINE HCL (CARDIAC) 20 MG/ML IV SOLN
INTRAVENOUS | Status: AC
  Filled 2014-05-09: qty 5

## 2014-05-09 MED ORDER — IOHEXOL 300 MG/ML  SOLN: INTRAMUSCULAR | Status: DC | PRN

## 2014-05-09 MED ORDER — HEPARIN SODIUM (PORCINE) 1000 UNIT/ML DIALYSIS: 20.0000 [IU]/kg | INTRAMUSCULAR | Status: DC | PRN

## 2014-05-09 MED ORDER — FENTANYL CITRATE 0.05 MG/ML IJ SOLN
INTRAMUSCULAR | Status: DC | PRN
  Administered 2014-05-09 (×2): 25 ug via INTRAVENOUS

## 2014-05-09 MED ORDER — MIDAZOLAM HCL 2 MG/2ML IJ SOLN
INTRAMUSCULAR | Status: AC
  Filled 2014-05-09: qty 2

## 2014-05-09 MED ORDER — SODIUM CHLORIDE 0.9 % IV SOLN
10.0000 mg | INTRAVENOUS | Status: DC | PRN
  Administered 2014-05-09: 40 ug/min via INTRAVENOUS

## 2014-05-09 MED ORDER — HEPARIN SODIUM (PORCINE) 1000 UNIT/ML IJ SOLN
INTRAMUSCULAR | Status: DC | PRN
  Administered 2014-05-09: 9 mL

## 2014-05-09 MED ORDER — OXYCODONE HCL 5 MG PO TABS: 5.0000 mg | ORAL_TABLET | Freq: Once | ORAL | Status: DC | PRN

## 2014-05-09 MED ORDER — ONDANSETRON HCL 4 MG/2ML IJ SOLN
INTRAMUSCULAR | Status: AC
  Filled 2014-05-09: qty 2

## 2014-05-09 MED ORDER — PHENYLEPHRINE HCL 10 MG/ML IJ SOLN
INTRAMUSCULAR | Status: AC
  Filled 2014-05-09: qty 1

## 2014-05-09 MED ORDER — 0.9 % SODIUM CHLORIDE (POUR BTL) OPTIME
TOPICAL | Status: DC | PRN
  Administered 2014-05-09: 1000 mL

## 2014-05-09 MED ORDER — SODIUM CHLORIDE 0.9 % IV SOLN
1250.0000 mg | Freq: Once | INTRAVENOUS | Status: DC
  Filled 2014-05-09: qty 1250

## 2014-05-09 MED ORDER — PROMETHAZINE HCL 25 MG/ML IJ SOLN: 6.2500 mg | INTRAMUSCULAR | Status: DC | PRN

## 2014-05-09 SURGICAL SUPPLY — 60 items
ADH SKN CLS APL DERMABOND .7 (GAUZE/BANDAGES/DRESSINGS) ×3
BAG DECANTER FOR FLEXI CONT (MISCELLANEOUS) ×3 IMPLANT
BLADE 10 SAFETY STRL DISP (BLADE) ×3 IMPLANT
CATH CANNON HEMO 15F 50CM (CATHETERS) ×4 IMPLANT
CATH CANNON HEMO 15FR 19 (HEMODIALYSIS SUPPLIES) IMPLANT
CATH CANNON HEMO 15FR 23CM (HEMODIALYSIS SUPPLIES) IMPLANT
CATH CANNON HEMO 15FR 31CM (HEMODIALYSIS SUPPLIES) IMPLANT
CATH CANNON HEMO 15FR 32 (HEMODIALYSIS SUPPLIES) IMPLANT
CATH CANNON HEMO 15FR 32CM (HEMODIALYSIS SUPPLIES) IMPLANT
CATH STRAIGHT 5FR 65CM (CATHETERS) ×2 IMPLANT
COVER PROBE W GEL 5X96 (DRAPES) ×3 IMPLANT
COVER SURGICAL LIGHT HANDLE (MISCELLANEOUS) ×3 IMPLANT
DECANTER SPIKE VIAL GLASS SM (MISCELLANEOUS) ×2 IMPLANT
DERMABOND ADVANCED (GAUZE/BANDAGES/DRESSINGS) ×6
DERMABOND ADVANCED .7 DNX12 (GAUZE/BANDAGES/DRESSINGS) IMPLANT
DRAPE C-ARM 42X72 X-RAY (DRAPES) ×5 IMPLANT
DRAPE CHEST BREAST 15X10 FENES (DRAPES) ×3 IMPLANT
ELECT BLADE 4.0 EZ CLEAN MEGAD (MISCELLANEOUS) ×3
ELECT PENCIL ROCKER SW 15FT (MISCELLANEOUS) ×2 IMPLANT
ELECT REM PT RETURN 9FT ADLT (ELECTROSURGICAL) ×3
ELECTRODE BLDE 4.0 EZ CLN MEGD (MISCELLANEOUS) IMPLANT
ELECTRODE REM PT RTRN 9FT ADLT (ELECTROSURGICAL) IMPLANT
GAUZE SPONGE 2X2 8PLY STRL LF (GAUZE/BANDAGES/DRESSINGS) ×1 IMPLANT
GAUZE SPONGE 4X4 16PLY XRAY LF (GAUZE/BANDAGES/DRESSINGS) ×5 IMPLANT
GLOVE BIO SURGEON STRL SZ7 (GLOVE) ×3 IMPLANT
GLOVE BIOGEL PI IND STRL 6.5 (GLOVE) IMPLANT
GLOVE BIOGEL PI IND STRL 7.5 (GLOVE) ×1 IMPLANT
GLOVE BIOGEL PI INDICATOR 6.5 (GLOVE) ×6
GLOVE BIOGEL PI INDICATOR 7.5 (GLOVE) ×2
GLOVE ECLIPSE 6.5 STRL STRAW (GLOVE) ×2 IMPLANT
GOWN STRL REUS W/ TWL LRG LVL3 (GOWN DISPOSABLE) ×2 IMPLANT
GOWN STRL REUS W/TWL LRG LVL3 (GOWN DISPOSABLE) ×9
KIT BASIN OR (CUSTOM PROCEDURE TRAY) ×3 IMPLANT
KIT ROOM TURNOVER OR (KITS) ×3 IMPLANT
NDL 18GX1X1/2 (RX/OR ONLY) (NEEDLE) ×1 IMPLANT
NDL HYPO 25GX1X1/2 BEV (NEEDLE) ×1 IMPLANT
NEEDLE 18GX1X1/2 (RX/OR ONLY) (NEEDLE) ×3 IMPLANT
NEEDLE HYPO 25GX1X1/2 BEV (NEEDLE) ×3 IMPLANT
NS IRRIG 1000ML POUR BTL (IV SOLUTION) ×3 IMPLANT
PACK SURGICAL SETUP 50X90 (CUSTOM PROCEDURE TRAY) ×3 IMPLANT
PAD ARMBOARD 7.5X6 YLW CONV (MISCELLANEOUS) ×6 IMPLANT
SET MICROPUNCTURE 5F STIFF (MISCELLANEOUS) IMPLANT
SHEATH AVANTI 11CM 5FR (MISCELLANEOUS) ×2 IMPLANT
SHEATH BRITE TIP 6FR 35CM (SHEATH) ×2 IMPLANT
SOAP 2 % CHG 4 OZ (WOUND CARE) ×3 IMPLANT
SPONGE GAUZE 2X2 STER 10/PKG (GAUZE/BANDAGES/DRESSINGS) ×2
SUT ETHILON 3 0 PS 1 (SUTURE) ×5 IMPLANT
SUT MNCRL AB 4-0 PS2 18 (SUTURE) ×5 IMPLANT
SUT VIC AB 3-0 SH 27 (SUTURE) ×3
SUT VIC AB 3-0 SH 27X BRD (SUTURE) IMPLANT
SYR 20CC LL (SYRINGE) ×6 IMPLANT
SYR 3ML LL SCALE MARK (SYRINGE) ×3 IMPLANT
SYR 5ML LL (SYRINGE) ×3 IMPLANT
SYR CONTROL 10ML LL (SYRINGE) ×3 IMPLANT
SYRINGE 10CC LL (SYRINGE) ×3 IMPLANT
TAPE CLOTH SURG 4X10 WHT LF (GAUZE/BANDAGES/DRESSINGS) ×2 IMPLANT
TOWEL OR 17X24 6PK STRL BLUE (TOWEL DISPOSABLE) ×3 IMPLANT
TOWEL OR 17X26 10 PK STRL BLUE (TOWEL DISPOSABLE) ×3 IMPLANT
WATER STERILE IRR 1000ML POUR (IV SOLUTION) ×1 IMPLANT
WIRE AMPLATZ SS-J .035X180CM (WIRE) ×2 IMPLANT

## 2014-05-09 NOTE — H&P (View-Only) (Signed)
[facility] [place]  Patient name: [name] MRN: [id number] DOB: 01-Aug-1951 Sex: male  REASON FOR CONSULT: Remove Right Femoral Diatek for infection, Place new Femoral catheter Tuesday, and evaluate for long term access. Consult from Dr. Jonnie Finner.  HPI: [name] is a 63 y.o. male who was admitted on 05/02/2014 with sepsis related to his femoral dialysis catheter. He was diagnosed with Pseudomonas bacteremia secondary to an infected right groin catheter. Of note, his right femoral catheter had been changed over a guidewire by Dr. Kellie Simmering on 04/29/2014. This was done because he had exposed cuff outside of the skin.  He has a complicated vascular access history. As documented in the note on 01/10/2013 I. Dr. 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 His right thigh AV graft was placed in February 2012. He had a left thigh AV graft placed in April of 2014 by Dr. Trula Slade. He had thrombolysis of his left thigh AV graft by interventional radiology on 12/28/2013.   Paliative Care has met with the family and at this point they do not want to sit limits on his medical care other than him being DNR.  He has problems with chronic hypotension.  He was dialyzed yesterday with his right femoral catheter.   Past Medical History  Diagnosis Date  . Heart murmur, systolic 28/78/6767  . Syncope 05/07/2009  . 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 Esophageal varices 11/07/2008  . Gastric ulcer 10/09    antral, with h pylori positive  . Congestive heart failure 03/06/2008  . Cellulitis and abscess of leg, except foot 03/06/2008  . Secondary hyperparathyroidism 02/02/2008  . Mute 02/02/2008  . Hyperlipidemia 02/02/2008  . Anemia 02/02/2008  . ESRD (end stage renal disease)     TTS hemodialysis  . GERD (gastroesophageal reflux  disease)   . Hypertension 02/02/2008    in history  . Mental retardation 02/02/2008  . Mute   . Seizures     "non in a while at home". none in past year .  Marland Kitchen Complication of anesthesia     in the past BP has dropped   . Sepsis due to Pseudomonas species 05/05/2014   History reviewed. No pertinent family history.  SOCIAL HISTORY: History  Substance Use Topics  . Smoking status: Never Smoker   . Smokeless tobacco: Never Used  . Alcohol Use: No   No Known Allergies  REVIEW OF SYSTEMS: Unable to obtain  PHYSICAL EXAM: Filed Vitals:   05/07/14 0354 05/07/14 0400 05/07/14 0600 05/07/14 0740  BP: _0 104/75  Pulse: 91   90  Temp: 97.6 F (36.4 C)   97.7 F (36.5 C)  TempSrc: Oral   Oral  Resp:    18  Height:      Weight:      SpO2: 93%   95%   Body mass index is 19.29 kg/(m^2). GENERAL: The patient is a well-nourished male, in no acute distress. The vital signs are documented above. CARDIOVASCULAR: There is a regular rate and rhythm. I cannot palpate pedal pulses. PULMONARY: There is good air exchange bilaterally without wheezing or rales. ABDOMEN: Soft and non-tender with normal pitched bowel sounds.  MUSCULOSKELETAL: There are no major deformities or cyanosis. He has had multiple grafts in both upper extremities and also his had thigh grafts bilaterally as described above.  DATA:  Lab Results  Component Value Date   WBC 13.0* 05/06/2014   HGB 8.5* 05/06/2014   HCT 26.0* 05/06/2014   MCV 89.3 05/06/2014   PLT 113* 05/06/2014   Lab Results  Component Value Date   NA 140 05/06/2014   K 4.8 05/06/2014   CL 98 05/06/2014   CO2 25 05/06/2014   Lab Results  Component Value Date   CREATININE 4.14* 05/06/2014   Lab Results  Component Value Date   INR 1.08 04/29/2014   INR 1.13 10/02/2013   INR 1.00 09/14/2013   Lab Results  Component Value Date   HGBA1C 5.5 12/22/2012   CBG (last 3)   Recent Labs  05/06/14 1930 05/06/14 2341 05/07/14 0353  GLUCAP  134* 102* 105*   MICROBIOLOGY: The patient had positive blood cultures for Pseudomonas on 05/03/2014. In addition, his right groin exit site grew Pseudomonas.  MEDICAL ISSUES:  END-STAGE RENAL DISEASE: And I removed his right femoral Diatek catheter at the bedside with all difficulty. This had only been placed a little over a week ago. Pressure was held for hemostasis and there was no immediate complications noted. He will be scheduled for placement of a new thigh graft on Tuesday morning. He will require placement of a right or left femoral catheter given his known central venous occlusions. We have also been asked to evaluate him for a new thigh graft. I do not think that he is a candidate for a new thigh graft. He has problems with chronic hypotension. He has had previous thigh grafts on both sides. Despite the risk of infection with the catheter alone, he would also be at significant risk for graft infection if a redo thigh graft is placed. I have known the patient for many years. In my opinion, I would not recommend placement of a new thigh graft.  [facility] [place]: [phone]

## 2014-05-09 NOTE — Anesthesia Preprocedure Evaluation (Addendum)
Anesthesia Evaluation  Patient identified by MRN, date of birth, ID band Patient awake    Reviewed: Allergy & Precautions, H&P , NPO status , Patient's Chart, lab work & pertinent test results  Airway Mallampati: II TM Distance: >3 FB Neck ROM: Full    Dental   Pulmonary neg pulmonary ROS,  breath sounds clear to auscultation        Cardiovascular hypertension, + Peripheral Vascular Disease and +CHF Rhythm:Regular Rate:Normal     Neuro/Psych Seizures -, Well Controlled,  Mentally challenged   GI/Hepatic Neg liver ROS, PUD, GERD-  Medicated and Controlled,  Endo/Other    Renal/GU ESRF and DialysisRenal disease     Musculoskeletal   Abdominal   Peds  Hematology  (+) Blood dyscrasia (Hb 9.2), anemia ,   Anesthesia Other Findings   Reproductive/Obstetrics                          Anesthesia Physical Anesthesia Plan  ASA: III  Anesthesia Plan: General   Post-op Pain Management:    Induction: Intravenous and Inhalational  Airway Management Planned: LMA  Additional Equipment:   Intra-op Plan:   Post-operative Plan:   Informed Consent:   Dental advisory given  Plan Discussed with: CRNA, Anesthesiologist and Surgeon  Anesthesia Plan Comments: (Plan routine monitors, GA- LMA OK)       Anesthesia Quick Evaluation

## 2014-05-09 NOTE — Anesthesia Procedure Notes (Signed)
Procedure Name: LMA Insertion Date/Time: 05/09/2014 7:45 AM Performed by: Sarita HaverFLOWERS, Alacia Rehmann T Pre-anesthesia Checklist: Patient identified, Timeout performed, Emergency Drugs available, Suction available and Patient being monitored Patient Re-evaluated:Patient Re-evaluated prior to inductionOxygen Delivery Method: Circle system utilized and Simple face mask Preoxygenation: Pre-oxygenation with 100% oxygen Intubation Type: Combination inhalational/ intravenous induction Ventilation: Mask ventilation without difficulty LMA: LMA inserted LMA Size: 4.0 Number of attempts: 1 Airway Equipment and Method: Patient positioned with wedge pillow Placement Confirmation: positive ETCO2 and breath sounds checked- equal and bilateral Tube secured with: Tape Dental Injury: Teeth and Oropharynx as per pre-operative assessment

## 2014-05-09 NOTE — Op Note (Signed)
OPERATIVE NOTE  PROCEDURE: 1. right femoral vein tunneled dialysis catheter placement 2. right femoral vein cannulation under ultrasound guidance  PRE-OPERATIVE DIAGNOSIS: end-stage renal failure  POST-OPERATIVE DIAGNOSIS: same as above  SURGEON: Leonides SakeBrian Chen, MD  ANESTHESIA: general  ESTIMATED BLOOD LOSS: 30 cc  FINDING(S): 1.  One tip protruding into atrium, one tip in inferior vena cava  2.  No left femoral vein on sonosite evident 3.  Incidentally, seroma aspirated from around left thigh arteriovenous graft   SPECIMEN(S):  none  INDICATIONS:   [name] is a 63 y.o. male who  presents with end stage renal disease.  This patient is essentially end access except for a right femoral tunneled dialysis catheter.  His tunneled dialysis catheter recently became infected requiring its removal.  The patient presents for tunneled dialysis catheter placement.  The patient is aware the risks of tunneled dialysis catheter placement include but are not limited to: bleeding, infection, central venous injury, pneumothorax, possible venous stenosis, possible malpositioning in the venous system, and possible infections related to long-term catheter presence.  The patient was aware of these risks and agreed to proceed.  DESCRIPTION: After written full informed consent was obtained from the patient, the patient was taken back to the operating room.  Prior to induction, the patient was given IV antibiotics.  After obtaining adequate sedation, the patient was prepped and draped in the standard fashion for a femoral vein tunneled dialysis catheter placement.  This patient's groin is severely scarred from multiple procedures.  Under ultrasound guidance, I attempted to cannulate the right femoral vein.  In the process, I aspirated ~5 cc of seroma from around what I presumed to be the graft/femoral vein.  The femoral artery was cannulated after the needle defected off what felt to be graft material.  I  removed the needle and held pressure for 5 minutes.  After 5 more attempts, under sonosite guidance, the right femoral vein or the graft anastomosed to the vein was cannulated with the 18 gauge needle.  A J-wire was then placed into the inferior vena cava under fluroscopic guidance.  The wire was then secured in place with a clamp to the drapes.  I then made stab incisions at the cannulation and exit sites.  There was perfuse bleeding from the exit site and bleeding varicosity was evident.  Due to the extensive bleeding, I had to place a 3-0 Vicryl stitch to control the bleeding.  I made another stab incision more medial in the leg.  With great difficulty, I dissected from the exit site to the cannulation site with a metal dissected and dilated the subcutaneous tunnel with a plastic dilator.  The wire was then unclamped and I removed the needle.  The skin tract and venotomy was dilated serially with dilators.  Finally, the dilator-sheath was placed under fluroscopic guidance into the right iliac vein.  The dilator and wire were removed.  A 55 cm Diatek catheter was placed under fluoroscopic guidance into the right atrium.  The sheath was broken and peeled away while holding the catheter cuff at the level of the skin.  The back of the Diatek was connected to the metal dilator and delivered through the subcutaneous tunnel.  The back end of this catheter was transected, revealing the two lumens of this catheter.  The ports were docked onto these two lumens.  The catheter hub was then screwed into place.  Each port was tested by aspirating and flushing.  I felt some  resistance in one of the ports, so I re-examine the catheter position under fluoroscopy.  The tips appeared to be curled and apposed to the wall.  I tried rotating and repositioning the catheter, this failed to reposition the tip with in the atrium.  I elected to immediately exchange the catheter due to poor positioning.  I clamped the catheter in the  cannulation incision and transected the catheter proximal to the cuff.  I pulled the back end of the catheter out the cannulation incision.  I rewired the catheter with the Amplatz wire under fluoroscopy and removed the catheter, leaving the wire in the atrium.  The dilator sheath was replaced over the wire.  I made another stab incision distally so I could position the catheter partially into the inferior vena cava.  I tunneled from the new exit site to the cannulation incision.  I dilated this tract.  The 55 cm Diatek catheter was weaved onto the wire and advanced into the atrium/IVC .  The wire was removed and the sheath peeled away.  I connected the back end of the catheter to the tunneler and delivered it through the subcutaneous tunnel.  I transected the back end of the catheter, revealing the two lumens of this catheter.  The ports were docked onto these two lumens.  The catheter hub was then screwed into place. Each port was tested and no resistance was noted.  Each port was then thoroughly flushed with heparinized saline.  The catheter was secured in placed with two interrupted stitches of 3-0 Nylon tied to the catheter.  The cannulation incision was closed with a U-stitch of 4-0 Monocryl.  The cannulation and exit incisions were cleaned and sterile bandages applied.  Each port was then loaded with concentrated heparin (1000 Units/mL) at the manufacturer recommended volumes to each port.  Sterile caps were applied to each port.  On completion fluoroscopy, the tips of the catheter were in the right atrium, and there was no evidence of pneumothorax.  COMPLICATIONS: none  CONDITION: stable  Leonides Sake, MD [facility] Office: [phone] Pager: [phone]  05/09/2014, 9:02 AM

## 2014-05-09 NOTE — Progress Notes (Signed)
[facility] Progress Note   Subjective: No significant change- s/p replacement of right sided tunneled cath  Filed Vitals:   05/09/14 0930 05/09/14 0945 05/09/14 1000 05/09/14 1021  BP: 105/71 98/56 100/68 122/90  Pulse: 109 111 111 112  Temp:   97.4 F (36.3 C) 97.4 F (36.3 C)  TempSrc:    Axillary  Resp: 19 20 13 20   Height:      Weight:      SpO2: 100% 100% 100% 100%   Exam: Awake and alert, mute No jvd Chest clear bilat RRR no MRG ABd soft, NTND, no ascites No LE or UE edema Neuro is nf, Ox 3 R femoral tunneled HD cath- new   Dialysis: TTS [facility]  3hr 56kgs 160 400/1.5 2k/2ca Heparin 3500 R fem cath  hectorol 11 mcg IV/HD Arenesp 100 q week Venofer  Assessment:  1 Pseudomonas bacteremia due to infected R groin HD cath- cath removed 7/18  replaced 7/21  on IV abx; on IV Fortaz- can continue fortaz with OP HD 2 Perm access- 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 Or tomorrow    [name] A    05/09/2014, 10:48 AM     Recent Labs Lab 05/04/14 0533 05/05/14 0500 05/06/14 0250 05/09/14 0750  NA 142 142 140 137  K 5.9* 6.1* 4.8 4.5  CL 102 101 98  --   CO2 20 21 25   --   GLUCOSE 113* 141* 140*  --   BUN 49* 60* 23  --   CREATININE 8.58* 8.89* 4.14*  --   CALCIUM 7.7* 7.3* 7.8*  --   PHOS  --   --  4.0  --     Recent Labs Lab 05/02/14 1639  AST 28  ALT <5   ALKPHOS 125*  BILITOT 0.8  PROT 6.2  ALBUMIN 3.0*    Recent Labs Lab 05/03/14 0230 05/04/14 0830 05/05/14 0500 05/06/14 0250 05/09/14 0750  WBC  --  15.0* 15.4* 13.0*  --   NEUTROABS 11.0*  --   --   --   --   HGB  --  7.7* 8.4* 8.5* 9.2*  HCT  --  24.0* 26.6* 26.0* 27.0*  MCV  --  92.7 95.0 89.3  --   PLT  --  92* 96* 113*  --    . cefTAZidime (FORTAZ)  IV  2 g Intravenous Q T,Th,Sa-HD  . cinacalcet  30 mg Oral BID WC  . darbepoetin (ARANESP) injection - DIALYSIS  100 mcg Intravenous Q Thu-HD  . doxercalciferol  10 mcg Intravenous Q T,Th,Sa-HD  . fentaNYL      . hydrocortisone  50 mg Oral BID  . midodrine  15 mg Oral TID  . multivitamin  1 tablet Oral QHS  . pantoprazole  40 mg Oral Daily  . phenytoin  200 mg Oral QHS  . sevelamer carbonate  800 mg Oral TID WC  . sodium chloride  3 mL Intravenous Q12H     acetaminophen, acetaminophen, heparin

## 2014-05-09 NOTE — Op Note (Deleted)
OPERATIVE NOTE  PROCEDURE: 1. Left internal jugular vein tunneled dialysis catheter placement 2. Left internal jugular vein cannulation under ultrasound guidance  PRE-OPERATIVE DIAGNOSIS: end-stage renal failure  POST-OPERATIVE DIAGNOSIS: same as above  SURGEON: Leonides SakeBrian Chen, MD  ANESTHESIA: local and MAC  ESTIMATED BLOOD LOSS: 30 cc  FINDING(S): 1.  Tips of the catheter in the right atrium on fluoroscopy 2.  No obvious pneumothorax on fluoroscopy  SPECIMEN(S):  none  INDICATIONS:   [name] is a 63 y.o. male who presents with end stage renal disease.  The patient presents for tunneled dialysis catheter placement.  The patient is aware the risks of tunneled dialysis catheter placement include but are not limited to: bleeding, infection, central venous injury, pneumothorax, possible venous stenosis, possible malpositioning in the venous system, and possible infections related to long-term catheter presence.  The patient was aware of these risks and agreed to proceed.  DESCRIPTION: After written full informed consent was obtained from the patient, the patient was taken back to the operating room.  Prior to induction, the patient was given IV antibiotics.  After obtaining adequate sedation, the patient was prepped and draped in the standard fashion for a chest or neck tunneled dialysis catheter placement.  I anesthesized the neck cannulation site with local anesthetic.  Under ultrasound guidance, the left internal jugular vein was cannulated with the 18 gauge needle.  A J-wire was then placed down in the inferior vena cava under fluroscopic guidance.  The wire was then secured in place with a clamp to the drapes.  The cannulation site, the catheter exit site, and tract for the subcutaneous tunnel were then anesthesized with a total of 20 cc of a 1:1 mixture of 0.5% Marcaine without epinepherine and 1% Lidocaine with epinepherine.  I then made stab incisions at the neck and exit sites.    I dissected from the exit site to the cannulation site with a metal tunneler.   The subcutaneous tunnel was dilated by passing a plastic dilator over the metal dissector. The wire was then unclamped and I removed the needle.  The skin tract and venotomy was dilated serially with dilators.  Finally, the dilator-sheath was placed under fluroscopic guidance into the superior vena cava.  The dilator and wire were removed.  A 27 cm Diatek catheter was placed under fluoroscopic guidance down into the right atrium.  The sheath was broken and peeled away while holding the catheter cuff at the level of the skin.  The back end of this catheter was transected, revealing the two lumens of this catheter.  The ports were docked onto these two lumens.  The catheter hub was then screwed into place.  Each port was tested by aspirating and flushing.  No resistance was noted.  Each port was then thoroughly flushed with heparinized saline.  The catheter was secured in placed with two interrupted stitches of 3-0 Nylon tied to the catheter.  The neck incision was closed with a U-stitch of 4-0 Monocryl.  The neck and chest incision were cleaned and sterile bandages applied.  Each port was then loaded with concentrated heparin (1000 Units/mL) at the manufacturer recommended volumes to each port.  Sterile caps were applied to each port.  On completion fluoroscopy, the tips of the catheter were in the right atrium, and there was no evidence of pneumothorax.  COMPLICATIONS: none  CONDITION: stable  Leonides SakeBrian Chen, MD [facility] Office: [phone] Pager: [phone]  05/09/2014, 10:21 AM

## 2014-05-09 NOTE — Transfer of Care (Signed)
Immediate Anesthesia Transfer of Care Note  Patient: [name]  Procedure(s) Performed: Procedure(s): INSERTION OF DIALYSIS CATHETER- FEMORAL (Right)  Patient Location: PACU  Anesthesia Type:General  Level of Consciousness: awake and alert   Airway & Oxygen Therapy: Patient connected to face mask oxygen  Post-op Assessment: Report given to PACU RN  Post vital signs: stable  Complications: No apparent anesthesia complications

## 2014-05-09 NOTE — Progress Notes (Signed)
Pt brought to hemodialysis unit. Pts R femerol cath lines were aspirated and cultures were drawn without problems. Pt was then put on the hemodialysis machine and the lines immediately filled with foamy air bubbles throughout the entire system. Tx was stopped and the venous port on the catheter was aspirated that returned air and foamy blood and then the port clotted off. Small pin size hole noted to be in the flexible tubing of the cath where it attaches to the plastic barrelled guard. Dr. Briant CedarMattingly called and informed of this. Dr. Juel BurrowLin in the unit and verified the hole in the catheter. Catheter was capped and Tx was postponed until pt can go for a catheter exchange in IR.

## 2014-05-09 NOTE — Progress Notes (Signed)
Palliative Medicine Team Progress Note  Checked in on [name] this afternoon. Femoral catheter placed. Some pain in his groin suspected. Plans for HD this afternoon. Remains basically at his baseline. Once HD cath utilized and he tolerates HD he will likely be ready for discharge. I spoke with [name] about getting [name] additional PCS services and supervision to and from outpatient HD. Goals of care remain to continue HD as long as [name] has access, treat infections, avoid ICU, DNR and focus on comfort and QOL simultaneously with standard of care for his chronic diseases. PMT will follow until d/c IMTS resident will need to follow up on request for additional P[facility] hours [facility] after discharge.  Anderson MaltaElizabeth Golding, DO Palliative Medicine [phone]

## 2014-05-09 NOTE — Progress Notes (Signed)
ANTIBIOTIC CONSULT NOTE - INITIAL  Pharmacy Consult for vancomycin Indication: bacteremia  No Known Allergies  Patient Measurements: Height: 5\' 4"  (162.6 cm) Weight: 124 lb 12.5 oz (56.6 kg) IBW/kg (Calculated) : 59.2 Adjusted Body Weight:   Vital Signs: Temp: 98.6 F (37 C) (07/21 1608) Temp src: Oral (07/21 1608) BP: 98/58 mmHg (07/21 1608) Pulse Rate: 102 (07/21 1700) Intake/Output from previous day: 07/20 0701 - 07/21 0700 In: 0  Out: 2 [Stool:2] Intake/Output from this shift:    Labs:  Recent Labs  05/09/14 0750  HGB 9.2*   Estimated Creatinine Clearance: 14.2 ml/min (by C-G formula based on Cr of 4.14). No results found for this basename: VANCOTROUGH, Leodis Binet, VANCORANDOM, GENTTROUGH, GENTPEAK, GENTRANDOM, TOBRATROUGH, TOBRAPEAK, TOBRARND, AMIKACINPEAK, AMIKACINTROU, AMIKACIN,  in the last 72 hours   Microbiology: Recent Results (from the past 720 hour(s))  MRSA PCR SCREENING     Status: None   Collection Time    04/11/14 12:56 AM      Result Value Ref Range Status   MRSA by PCR NEGATIVE  NEGATIVE Final   Comment:            The GeneXpert MRSA Assay (FDA     approved for NASAL specimens     only), is one component of a     comprehensive MRSA colonization     surveillance program. It is not     intended to diagnose MRSA     infection nor to guide or     monitor treatment for     MRSA infections.  CULTURE, BLOOD (ROUTINE X 2)     Status: None   Collection Time    04/24/14  6:30 PM      Result Value Ref Range Status   Specimen Description BLOOD LEFT CENTRAL LINE   Final   Special Requests BOTTLES DRAWN AEROBIC AND ANAEROBIC 10CC   Final   Culture  Setup Time     Final   Value: 04/25/2014 00:22     Performed at [facility]   Culture     Final   Value: NO GROWTH 5 DAYS     Performed at [facility]   Report Status 05/01/2014 FINAL   Final  CULTURE, BLOOD (ROUTINE X 2)     Status: None   Collection Time    05/02/14  4:39 PM   Result Value Ref Range Status   Specimen Description BLOOD NECK   Final   Special Requests BOTTLES DRAWN AEROBIC AND ANAEROBIC 5 CC   Final   Culture  Setup Time     Final   Value: 05/02/2014 22:38     Performed at [facility]   Culture     Final   Value: PSEUDOMONAS AERUGINOSA     Note: SUSCEPTIBILITIES PERFORMED ON PREVIOUS CULTURE WITHIN THE LAST 5 DAYS.     Note: Gram Stain Report Called to,Read Back By and Verified With: Lovie Macadamia RN [phone]     Performed at [facility]   Report Status 05/06/2014 FINAL   Final  CULTURE, BLOOD (ROUTINE X 2)     Status: None   Collection Time    05/02/14 11:25 PM      Result Value Ref Range Status   Specimen Description BLOOD RIGHT ARM   Final   Special Requests BOTTLES DRAWN AEROBIC ONLY 5CC   Final   Culture  Setup Time     Final   Value: 05/03/2014 08:46     Performed  at [facility]   Culture     Final   Value: PSEUDOMONAS AERUGINOSA     1245 Note: Gram Stain Report Called to,Read Back By and Verified With: [facility] 05/04/14 FULKC     Performed at [facility]   Report Status 05/06/2014 FINAL   Final   Organism ID, Bacteria PSEUDOMONAS AERUGINOSA   Final  CULTURE, BLOOD (SINGLE)     Status: None   Collection Time    05/03/14  8:52 AM      Result Value Ref Range Status   Specimen Description BLOOD RIGHT ANTECUBITAL   Final   Special Requests BOTTLES DRAWN AEROBIC ONLY 2CC   Final   Culture  Setup Time     Final   Value: 05/03/2014 13:23     Performed at [facility]   Culture     Final   Value: PSEUDOMONAS AERUGINOSA     Note: SUSCEPTIBILITIES PERFORMED ON PREVIOUS CULTURE WITHIN THE LAST 5 DAYS.     Note: Gram Stain Report Called to,Read Back By and Verified With: STEPHANIE SHAW 05/04/14 1000 BY SMITHERSJ     Performed at [facility]   Report Status 05/06/2014 FINAL   Final  WOUND CULTURE     Status: None   Collection Time    05/04/14 10:48 AM      Result Value Ref  Range Status   Specimen Description WOUND RIGHT GROIN   Final   Special Requests NONE   Final   Gram Stain     Final   Value: RARE WBC PRESENT,BOTH PMN AND MONONUCLEAR     NO SQUAMOUS EPITHELIAL CELLS SEEN     NO ORGANISMS SEEN     Performed at [facility]   Culture     Final   Value: FEW PSEUDOMONAS AERUGINOSA     Performed at [facility]   Report Status 05/07/2014 FINAL   Final   Organism ID, Bacteria PSEUDOMONAS AERUGINOSA   Final  CULTURE, BLOOD (SINGLE)     Status: None   Collection Time    05/05/14  6:50 PM      Result Value Ref Range Status   Specimen Description BLOOD RIGHT HAND   Final   Special Requests BOTTLES DRAWN AEROBIC ONLY 3CC   Final   Culture  Setup Time     Final   Value: 05/05/2014 21:57     Performed at [facility]   Culture     Final   Value:        BLOOD CULTURE RECEIVED NO GROWTH TO DATE CULTURE WILL BE HELD FOR 5 DAYS BEFORE ISSUING A FINAL NEGATIVE REPORT     Performed at [facility]   Report Status PENDING   Incomplete  CATH TIP CULTURE     Status: None   Collection Time    05/07/14  9:01 AM      Result Value Ref Range Status   Specimen Description CATH TIP RIGHT FEMORAL DIATEK CATH TIP   Final   Special Requests Normal   Final   Culture     Final   Value: Culture reincubated for better growth     Performed at [facility]   Report Status PENDING   Incomplete    Medical History: Past Medical History  Diagnosis Date  . Heart murmur, systolic 08/10/2009  . Syncope 05/07/2009  . 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 Esophageal varices 11/07/2008  . Gastric ulcer 10/09  antral, with h pylori positive  . Congestive heart failure 03/06/2008  . Cellulitis and abscess of leg, except foot 03/06/2008  . Secondary hyperparathyroidism 02/02/2008  . Mute 02/02/2008  . Hyperlipidemia 02/02/2008  . Anemia 02/02/2008  . ESRD (end stage renal disease)     TTS hemodialysis  . GERD (gastroesophageal  reflux disease)   . Hypertension 02/02/2008    in history  . Mental retardation 02/02/2008  . Mute   . Seizures     "non in a while at home". none in past year .  Marland Kitchen. Complication of anesthesia     in the past BP has dropped   . Sepsis due to Pseudomonas species 05/05/2014    Medications:  Scheduled:  . cefTAZidime (FORTAZ)  IV  2 g Intravenous Q T,Th,Sa-HD  . cinacalcet  30 mg Oral BID WC  . darbepoetin (ARANESP) injection - DIALYSIS  100 mcg Intravenous Q Thu-HD  . doxercalciferol  10 mcg Intravenous Q T,Th,Sa-HD  . fentaNYL      . fentaNYL  12.5 mcg Intravenous Once  . [START ON 05/10/2014] hydrocortisone  50 mg Oral Daily  . midodrine  15 mg Oral TID  . multivitamin  1 tablet Oral QHS  . pantoprazole  40 mg Oral Daily  . phenytoin  200 mg Oral QHS  . sevelamer carbonate  800 mg Oral TID WC  . sodium chloride  3 mL Intravenous Q12H  . vancomycin  1,250 mg Intravenous Once   Infusions:   Assessment: 63 yo male with bacteremia will be started on vancomycin.  Hx of ESRD on HD.  Per HD nurse MD wants loading dose tonight - she said the patient has hole in HD cath and continues to rip IV out Genell Thede dose may or may not actually be administered.  Goal of Therapy:  pre-HD vanc 15-25  Plan:  1) Vancomycin 1250mg  iv x1 tonight.    F/u plan on further doses of vancomycin.   Cedric Denison, Tsz-Yin 05/09/2014,7:52 PM

## 2014-05-09 NOTE — Anesthesia Postprocedure Evaluation (Signed)
  Anesthesia Post-op Note  Patient: [name]  Procedure(s) Performed: Procedure(s): INSERTION OF DIALYSIS CATHETER- FEMORAL (Right)  Patient Location: PACU  Anesthesia Type:General  Level of Consciousness: awake, alert  and patient cooperative  Airway and Oxygen Therapy: Patient Spontanous Breathing and Patient connected to nasal cannula oxygen  Post-op Pain: none  Post-op Assessment: Post-op Vital signs reviewed, Patient's Cardiovascular Status Stable, Respiratory Function Stable, Patent Airway, No signs of Nausea or vomiting and Pain level controlled  Post-op Vital Signs: Reviewed and stable  Last Vitals:  Filed Vitals:   05/09/14 1021  BP: 122/90  Pulse: 112  Temp: 36.3 C  Resp: 20    Complications: No apparent anesthesia complications

## 2014-05-09 NOTE — Progress Notes (Signed)
 Subjective: [name] [name] ate last night when he sister visited and helped feed him but he refuses food from the staff. This morning he had his R HD catheter replaced. On interview and exam he seems in pain as he was grunting and pointing with his mitt hands to his groin. I spoke to the nurse and she said she would give him tylenol and fentanyl 12.5 mg iv once was ordered as well. He was very animated and engaging.  Objective: Vital signs in last 24 hours: Filed Vitals:   05/09/14 0930 05/09/14 0945 05/09/14 1000 05/09/14 1021  BP: 105/71 98/56 100/68 122/90  Pulse: 109 111 111 112  Temp:   97.4 F (36.3 C) 97.4 F (36.3 C)  TempSrc:    Axillary  Resp: 19 20 13 20  Height:      Weight:      SpO2: 100% 100% 100% 100%   Weight change:   Intake/Output Summary (Last 24 hours) at 05/09/14 1117 Last data filed at 05/09/14 0300  Gross per 24 hour  Intake      0 ml  Output      1 ml  Net     -1 ml   BP 122/90  Pulse 112  Temp(Src) 97.4 F (36.3 C) (Axillary)  Resp 20  Ht 5' 4" (1.626 m)  Wt 124 lb 12.5 oz (56.6 kg)  BMI 21.41 kg/m2  SpO2 100%  General Appearance:    Agitated, pointing to groin with mitt hands, grunting can be heard from hallway,   HEENT:     moist mucous membranes  Lungs:     Very difficult to appreciate lung sounds as he was noncooperative, respirations unlabored.  Heart:    Regular rate and rhythm, S1 and S2 normal, 2/6 systolic ejection murmur loudest at apex  Abdomen:     Soft, non-tender, bowel sounds active all four quadrants,    no masses, no organomegaly  Extremities:   R groin with bandage c/d/i from HD catheter placement this morning  Pulses:   2+ and symmetric all extremities   Lab Results: Basic Metabolic Panel:  Recent Labs Lab 05/05/14 0500 05/06/14 0250 05/09/14 0750  NA 142 140 137  K 6.1* 4.8 4.5  CL 101 98  --   CO2 21 25  --   GLUCOSE 141* 140*  --   BUN 60* 23  --   CREATININE 8.89* 4.14*  --   CALCIUM 7.3* 7.8*  --   PHOS   --  4.0  --    Liver Function Tests:  Recent Labs Lab 05/02/14 1639  AST 28  ALT <5  ALKPHOS 125*  BILITOT 0.8  PROT 6.2  ALBUMIN 3.0*    Recent Labs Lab 05/02/14 1639  AMMONIA 40   CBC:  Recent Labs Lab 05/03/14 0230  05/05/14 0500 05/06/14 0250 05/09/14 0750  WBC  --   < > 15.4* 13.0*  --   NEUTROABS 11.0*  --   --   --   --   HGB  --   < > 8.4* 8.5* 9.2*  HCT  --   < > 26.6* 26.0* 27.0*  MCV  --   < > 95.0 89.3  --   PLT  --   < > 96* 113*  --   < > = values in this interval not displayed. Cardiac Enzymes:  Recent Labs Lab 05/03/14 0107 05/03/14 0908 05/03/14 1259  TROPONINI 0.88* 0.79* 1.10*   CBG:  Recent Labs   Lab 05/08/14 0723 05/08/14 1125 05/08/14 1614 05/08/14 2045 05/08/14 2345 05/09/14 0421  GLUCAP 70 71 74 154* 108* 137*   Coagulation: No results found for this basename: LABPROT, INR,  in the last 168 hours Micro Results: Recent Results (from the past 240 hour(s))  CULTURE, BLOOD (ROUTINE X 2)     Status: None   Collection Time    05/02/14  4:39 PM      Result Value Ref Range Status   Specimen Description BLOOD NECK   Final   Special Requests BOTTLES DRAWN AEROBIC AND ANAEROBIC 5 CC   Final   Culture  Setup Time     Final   Value: 05/02/2014 22:38     Performed at [facility]   Culture     Final   Value: PSEUDOMONAS AERUGINOSA     Note: SUSCEPTIBILITIES PERFORMED ON PREVIOUS CULTURE WITHIN THE LAST 5 DAYS.     Note: Gram Stain Report Called to,Read Back By and Verified With: Rico Sheehan RN [phone]     Performed at [facility]   Report Status 05/06/2014 FINAL   Final  CULTURE, BLOOD (ROUTINE X 2)     Status: None   Collection Time    05/02/14 11:25 PM      Result Value Ref Range Status   Specimen Description BLOOD RIGHT ARM   Final   Special Requests BOTTLES DRAWN AEROBIC ONLY 5CC   Final   Culture  Setup Time     Final   Value: 05/03/2014 08:46     Performed at [facility]   Culture     Final    Value: PSEUDOMONAS AERUGINOSA     1245 Note: Gram Stain Report Called to,Read Back By and Verified With: [facility] 05/04/14 [place]     Performed at [facility]   Report Status 05/06/2014 FINAL   Final   Organism ID, Bacteria PSEUDOMONAS AERUGINOSA   Final  CULTURE, BLOOD (SINGLE)     Status: None   Collection Time    05/03/14  8:52 AM      Result Value Ref Range Status   Specimen Description BLOOD RIGHT ANTECUBITAL   Final   Special Requests BOTTLES DRAWN AEROBIC ONLY 2CC   Final   Culture  Setup Time     Final   Value: 05/03/2014 13:23     Performed at [facility]   Culture     Final   Value: PSEUDOMONAS AERUGINOSA     Note: SUSCEPTIBILITIES PERFORMED ON PREVIOUS CULTURE WITHIN THE LAST 5 DAYS.     Note: Gram Stain Report Called to,Read Back By and Verified With: STEPHANIE SHAW 05/04/14 1000 BY SMITHERSJ     Performed at [facility]   Report Status 05/06/2014 FINAL   Final  WOUND CULTURE     Status: None   Collection Time    05/04/14 10:48 AM      Result Value Ref Range Status   Specimen Description WOUND RIGHT GROIN   Final   Special Requests NONE   Final   Gram Stain     Final   Value: RARE WBC PRESENT,BOTH PMN AND MONONUCLEAR     NO SQUAMOUS EPITHELIAL CELLS SEEN     NO ORGANISMS SEEN     Performed at [facility]   Culture     Final   Value: FEW PSEUDOMONAS AERUGINOSA     Performed at [facility]   Report Status 05/07/2014 FINAL  Final   Organism ID, Bacteria PSEUDOMONAS AERUGINOSA   Final  CULTURE, BLOOD (SINGLE)     Status: None   Collection Time    05/05/14  6:50 PM      Result Value Ref Range Status   Specimen Description BLOOD RIGHT HAND   Final   Special Requests BOTTLES DRAWN AEROBIC ONLY 3CC   Final   Culture  Setup Time     Final   Value: 05/05/2014 21:57     Performed at [facility]   Culture     Final   Value:        BLOOD CULTURE RECEIVED NO GROWTH TO DATE CULTURE WILL BE HELD FOR 5 DAYS  BEFORE ISSUING A FINAL NEGATIVE REPORT     Performed at [facility]   Report Status PENDING   Incomplete  CATH TIP CULTURE     Status: None   Collection Time    05/07/14  9:01 AM      Result Value Ref Range Status   Specimen Description CATH TIP RIGHT FEMORAL DIATEK CATH TIP   Final   Special Requests Normal   Final   Culture     Final   Value: Culture reincubated for better growth     Performed at [facility]   Report Status PENDING   Incomplete   Studies/Results: 05/04/14 EKG: NSR, no changes, no peaked T waves  Medications: I have reviewed the patient's current medications. Scheduled Meds: . cefTAZidime (FORTAZ)  IV  2 g Intravenous Q T,Th,Sa-HD  . cinacalcet  30 mg Oral BID WC  . darbepoetin (ARANESP) injection - DIALYSIS  100 mcg Intravenous Q Thu-HD  . doxercalciferol  10 mcg Intravenous Q T,Th,Sa-HD  . fentaNYL      . fentaNYL  12.5 mcg Intravenous Once  . [START ON 05/10/2014] hydrocortisone  50 mg Oral Daily  . midodrine  15 mg Oral TID  . multivitamin  1 tablet Oral QHS  . pantoprazole  40 mg Oral Daily  . phenytoin  200 mg Oral QHS  . sevelamer carbonate  800 mg Oral TID WC  . sodium chloride  3 mL Intravenous Q12H   Continuous Infusions:  PRN Meds:.acetaminophen, acetaminophen, heparin Assessment/Plan:  Sepsis 2/2 Pseudomonas bacteremia: Pt originally met all SIRS criteria.This infection may be due to his recently displaced R groin HD catheter although the site does not look infected. PCCM was consulted when pt continued to be hypotensive and given access issues will not be able to provide pressor support despite HCPOA wishes to continue with pressors this continues to be an issue especially since the patient lost all PIV access at this time as well. Dr. Lawson of vascular surgery evaluated the patient and felt the catheter should only be removed as last resort. He had his R femoral HD cath removed and it was replaced this morning. Patient currently on  ceftazidime 2 g w HD T/Th/Sa for pseudomonas. Dr Goldsborough saw him and says he can receive iv fortaz with outpatient HD.  Afebrile overnight. -appreciate vascular surgery, critical care, palliative care input  -zosyn 2.25 g q8h>> was transitioned to oral levaquin q48h>>IV ceftazidime 2 g with HD -repeat BCx -midodrine 15 mg TID  ESRD on HD: K+ now normalized in setting of having successful HD treatment. There is still grave concern that HD cath may become nonfunctional at any point which will terminate pt ability for continued HD per nephrology.  -appreciate renal and vascular input  -I/Os  -TTS   HD  -vascular surgery consulted and do not recommend and will not be pursing new graft access pt has truly reached his end access, R HD catheter changed this morning -Continue Cinacalcet, Dialyvite, Renvela   Acquired Adrenal Insufficiency: Patient has documented adrenal insufficiency, on Midodrine 15 mg tid at home. Was discharged on 10 day prednisone taper which was not completed.  Given Solu-Cortef 100 IV twice. Will likely need to be continued on stress-dosed steroids to maintain BP. Hypotension likely contributed to by adrenal insufficiency.  -hydrocortisone 50 mg po bid>>50 mg po daily today and taper in future  Hypotension: Patient is chronically hypotensive with unknown etiology but likely due to acquired adrenal insufficiency v acute shock. His BP overnight/this morning was 90s-120s/50s-90s.On midodrine 15 mg TID. Steroids can be tapered as BP is normal/high for him. -Continue midodrine 15 mg TID  -hydrocortisone 50 mg po daily -palliative support if becomes symptomatic   History of seizures: No recent seizures. Unlikely that acute mental status change was a result of seizure since pt has respiratory distress and hypotension. Pt back at mental status baseline. On phenytoin 200 mg daily at home. Dilantin level was undetectable in ED.  -cont dilantin  -Seizure precautions  -Neuro checks    Mental retardation: Patient appears back at baseline from chart review and per family. Non-verbal, but grunts. He maintained intermittent eye contact with team.   Dispo: Disposition is deferred at this time, awaiting improvement of current medical problems.  Anticipated discharge in approximately 3 day(s).   The patient does have a current PCP (Eden W Jones, MD) and does need an OPC hospital follow-up appointment after discharge.  The patient does have transportation limitations that hinder transportation to clinic appointments.  .Services Needed at time of discharge: Y = Yes, Blank = No PT:   OT:   RN:   Equipment:   Other:     LOS: 7 days    L , MD 05/09/2014, 11:17 AM 

## 2014-05-09 NOTE — Progress Notes (Signed)
At 1150, pt pulled mitten off L hand, and managed to unwrap the coban dressing and pull the IV out in his left wrist area. Attempted to keep pt from doing so, as it appeared the IV was still in, pt swung at nurse and pushed nurse away when she attempted to keep him from pulling line out.  The IV catheter was actually out already, tape was still on site covering the catheter.  No bleeding from site. Pt did not remove his pulse ox, pt allowed nurse to place hand mitten back on.   Report from night RN that pt would not allow staff to assess or place an IV, this IV was placed in OR today. Pt to receive IV antibiotics while in dialysis, pt to go to dialysis sometime today.

## 2014-05-09 NOTE — Interval H&P Note (Signed)
History and Physical Interval Note:  05/09/2014 7:06 AM  [name]  has presented today for surgery, with the diagnosis of End Stage Renal Disease  The various methods of treatment have been discussed with the patient and family. After consideration of risks, benefits and other options for treatment, the patient has consented to  Procedure(s): INSERTION OF DIALYSIS CATHETER- FEMORAL (N/A) as a surgical intervention .  The patient's history has been reviewed, patient examined, no change in status, stable for surgery.  I have reviewed the patient's chart and labs.  Questions were answered to the patient's satisfaction.     CHEN,BRIAN LIANG-YU

## 2014-05-10 ENCOUNTER — Encounter (HOSPITAL_COMMUNITY): Payer: Self-pay | Admitting: Vascular Surgery

## 2014-05-10 DIAGNOSIS — A4152 Sepsis due to Pseudomonas: Secondary | ICD-10-CM

## 2014-05-10 LAB — GLUCOSE, CAPILLARY
GLUCOSE-CAPILLARY: 100 mg/dL — AB (ref 70–99)
GLUCOSE-CAPILLARY: 108 mg/dL — AB (ref 70–99)
Glucose-Capillary: 103 mg/dL — ABNORMAL HIGH (ref 70–99)
Glucose-Capillary: 88 mg/dL (ref 70–99)
Glucose-Capillary: 91 mg/dL (ref 70–99)
Glucose-Capillary: 93 mg/dL (ref 70–99)
Glucose-Capillary: 96 mg/dL (ref 70–99)

## 2014-05-10 MED ORDER — PREDNISONE 10 MG PO TABS
10.0000 mg | ORAL_TABLET | Freq: Every day | ORAL | Status: DC
  Administered 2014-05-11: 10 mg via ORAL
  Filled 2014-05-10 (×3): qty 1

## 2014-05-10 NOTE — Progress Notes (Signed)
[facility] Progress Note   Subjective: Events noted- unable to do HD- suspected pinhole in new femoral PC- no change in status this AM  Filed Vitals:   05/09/14 1700 05/09/14 2000 05/10/14 0000 05/10/14 0400  BP:  105/69 105/61 82/56  Pulse: 102 111 106 90  Temp:  97.6 F (36.4 C) 98 F (36.7 C) 97.8 F (36.6 C)  TempSrc:  Oral Oral Oral  Resp:  17 19 14   Height:      Weight:      SpO2: 96% 97% 96% 92%   Exam: Awake and alert, mute No jvd Chest clear bilat RRR no MRG ABd soft, NTND, no ascites No LE or UE edema Neuro is nf, Ox 3 R femoral tunneled HD cath- new   Dialysis: [facility]  3hr 45mins 56kgs 160 400/1.5 2k/2ca Heparin 3500 R fem cath  hectorol 11 mcg IV/HD Arenesp 100 q week Venofer  Assessment:  1 Pseudomonas bacteremia due to infected R groin HD cath- cath removed 7/18  replaced 7/21  on IV abx; on IV Fortaz- can continue fortaz with OP HD- subsequent cultures have been negative 2 Perm access- 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, per VVS not a candidate for a thigh AVG; appreciate VVS assistance- wbc coming down 2 ESRD on HD TTS normally- no HD since Saturday due to access issues- hopefully for cathter replacement today followed by HD, then will need tomorrow to keep on schedule but maybe as OP 4 Hypotension- chronic issue on midodrine 5 Volume excess- resolved 6 Adrenal insufficiency- on HC 7 Anemia on aranesp- iron stores low but chose to not give iv iron due to bacteremia- possibly later as OP 8 HPTH cont hectorol, sensipar and renagel, phos 4.0 9 Nutrition - alb 3,  multivit 10 Seizures- on dilantin 11 MR / mutism 12 DNR 13 Dispo- I agree with getting patient back to his group home- maybe as soon as after HD today ? Or tomorrow- plans clearly delineated by palliative care    Creedence Heiss A    05/10/2014, 7:21 AM     Recent Labs Lab 05/04/14 0533 05/05/14 0500 05/06/14 0250 05/09/14 0750  NA 142 142 140 137   K 5.9* 6.1* 4.8 4.5  CL 102 101 98  --   CO2 20 21 25   --   GLUCOSE 113* 141* 140*  --   BUN 49* 60* 23  --   CREATININE 8.58* 8.89* 4.14*  --   CALCIUM 7.7* 7.3* 7.8*  --   PHOS  --   --  4.0  --    No results found for this basename: AST, ALT, ALKPHOS, BILITOT, PROT, ALBUMIN,  in the last 168 hours  Recent Labs Lab 05/04/14 0830 05/05/14 0500 05/06/14 0250 05/09/14 0750  WBC 15.0* 15.4* 13.0*  --   HGB 7.7* 8.4* 8.5* 9.2*  HCT 24.0* 26.6* 26.0* 27.0*  MCV 92.7 95.0 89.3  --   PLT 92* 96* 113*  --    . cefTAZidime (FORTAZ)  IV  2 g Intravenous Q T,Th,Sa-HD  . cinacalcet  30 mg Oral BID WC  . darbepoetin (ARANESP) injection - DIALYSIS  100 mcg Intravenous Q Thu-HD  . doxercalciferol  10 mcg Intravenous Q T,Th,Sa-HD  . fentaNYL  12.5 mcg Intravenous Once  . hydrocortisone  50 mg Oral Daily  . midodrine  15 mg Oral TID  . multivitamin  1 tablet Oral QHS  . pantoprazole  40 mg Oral Daily  .  phenytoin  200 mg Oral QHS  . sevelamer carbonate  800 mg Oral TID WC  . sodium chloride  3 mL Intravenous Q12H  . vancomycin  1,250 mg Intravenous Once     acetaminophen, acetaminophen, heparin

## 2014-05-10 NOTE — Progress Notes (Signed)
Admission note:   Arrival Method: Transfer from [facility].  Mental Status: Pt is alert, nonverbal/mute. Telemetry: N/A  Skin: Two small areas that appear to be a stage 2 on his left buttocks and 1 small healed area below his scrotum.  Tubes: N/A IV: No PIV. Pt has a left femoral HD catheter. Pain: Does not appear to be in pain.  Family: Pt is alone. Living Situation: From a facility. Safety Measures: Bed alarm in place, call bell within reach, x4 siderails with pillows for seizure precautions. 6E Orientation: Attempted to orient to unit and surroundings.  Leanna BattlesEckelmann, Keonte Daubenspeck Eileen, RN.

## 2014-05-10 NOTE — Progress Notes (Signed)
CSW spoke to patient's sister over the phone to introduce self and explain reason for phone call. Patient's sister states she wants patient to return back to his [facility]. Sister states that she would like to be notified when patient is medically ready so she can make a plan. CSW provided social work phone number and explained that Child psychotherapistsocial worker will call sister to inform of dc when patient is medically ready.  Maree KrabbeLindsay Kevaughn Ewing, MSW, Theresia MajorsLCSWA [phone]

## 2014-05-10 NOTE — Progress Notes (Signed)
  PROGRESS NOTE MEDICINE TEACHING ATTENDING   Day 8 of stay Patient name: [name]   Medical record number: [id number] Date of birth: [birth year]    I met with and evaluated [name] today. He is watching TV and did not interact with me. He did not appear to be in pain, he could not get his dialysis yesterday due to a bubble in his catheter. We continue IV fortaz for pseudomonas bacteremia. Labs with dialysis. White count per last WBC (7/18) coming down, afebrile.   I have discussed the care of this patient with my IM team residents. Please see the resident note for details.  [place], [place] 05/10/2014, 2:27 PM.

## 2014-05-10 NOTE — ED Provider Notes (Signed)
I saw and evaluated the patient, reviewed the resident's note and I agree with the findings and plan.   EKG Interpretation   Date/Time:  Tuesday May 02 2014 16:20:12 EDT Ventricular Rate:  142 PR Interval:  118 QRS Duration: 79 QT Interval:  302 QTC Calculation: 464 R Axis:   57 Text Interpretation:  Sinus tachycardia Nonspecific T abnormalities,  inferior leads Baseline wander in lead(s) V2 Confirmed by Rhunette CroftNANAVATI, MD,  Janey GentaANKIT [phone]([id number]) on 05/02/2014 5:40:26 PM       CRITICAL CARE Performed by: Derwood KaplanNanavati, Renate Danh   Total critical care time: 60 minutes for shock  Critical care time was exclusive of separately billable procedures and treating other patients.  Critical care was necessary to treat or prevent imminent or life-threatening deterioration.  Critical care was time spent personally by me on the following activities: development of treatment plan with patient and/or surrogate as well as nursing, discussions with consultants, evaluation of patient's response to treatment, examination of patient, obtaining history from patient or surrogate, ordering and performing treatments and interventions, ordering and review of laboratory studies, ordering and review of radiographic studies, pulse oximetry and re-evaluation of patient's condition.   Pt from HD with cc of low BP, altered mental status. Sudden change in mentation per report. PT has baseline MR, along with his ESRD and other comorbidities. Pt is protecting his airway.  Sepsis labs ordered, and pt's lactate was elevated > 5. Pt responded well to fluids initially, and NP went up, however, it decreased again.  Recent HD catheter placement. Pt has tachypnea, tachycardia and has leukopenia. Also has had adrenal insufficiency.  Admit for presumed septic shock and bacteremia. Broad spectrum antibiotics started. Advised admitting team to consider stim test or to given stress dose steroids. Holding off pressors for now, as patient per  admitting team has hx of low BP.   Derwood KaplanAnkit Denis Carreon, MD 05/10/14 [phone]

## 2014-05-10 NOTE — Progress Notes (Signed)
PT Cancellation Note  Patient Details Name: Rowe Clackeal W Greenfield MRN: [id number] DOB: 1951-09-27   Cancelled Treatment:    Reason Eval/Treat Not Completed: Pt became agitated when attempted to put down his bedrail to attempt mobility. Reaching to pull rail back up. Attempted on both sides of bed with same response.  Of note, spoke with pt's sister prior to attempting evaluation and she reports Jennette Kettleeal is supervision only with ambulation with no device PTA.   Caron Tardif 05/10/2014, 10:14 AM Pager [phone]

## 2014-05-10 NOTE — Progress Notes (Signed)
SLP Cancellation Note  Patient Details Name: [name] MRN: [id number] DOB: 31-Oct-1950    Cancelled treatment:        Pt. Now NPO for replacement of dialysis catheter and unable to observe with po's. Will reattempt next date.     Breck CoonsLisa Willis HornersvilleLitaker M.Ed [employer] IndustriesCCC-SLP Pager [phone]  05/10/2014

## 2014-05-10 NOTE — Progress Notes (Signed)
1 Day Post-Op  Subjective: Pt has malfunctioning tunneled HD catheter in Rt groin Was placed by Dr Imogene Burnhen in OR 7/21 During dialysis yesterday catheter was noted to have "bubbles" from it Now IR has been asked to replace catheter  Objective: Vital signs in last 24 hours: Temp:  [97.6 F (36.4 C)-98.6 F (37 C)] 98.6 F (37 C) (07/22 1110) Pulse Rate:  [90-111] 96 (07/22 1110) Resp:  [14-20] 18 (07/22 1110) BP: (82-105)/(49-69) 102/60 mmHg (07/22 1110) SpO2:  [92 %-98 %] 95 % (07/22 1110) Last BM Date: 05/09/14  Intake/Output from previous day:   Intake/Output this shift:    PE:  Afeb; vss Rt fem cath intact- not in use- bandaged Pt is mentally retarded- no communication with me Seems to follow commands though Heart: RRR Lungs: CTA   Lab Results:   Recent Labs  05/09/14 0750  HGB 9.2*  HCT 27.0*   BMET  Recent Labs  05/09/14 0750  NA 137  K 4.5   PT/INR No results found for this basename: LABPROT, INR,  in the last 72 hours ABG  Recent Labs  05/09/14 0750  HCO3 29.2*    Studies/Results: Dg Chest Port 1 View  05/09/2014   CLINICAL DATA:  Dialysis catheter placement.  EXAM: PORTABLE CHEST - 1 VIEW  COMPARISON:  05/03/2014.  FINDINGS: Low volume chest with bilateral basilar atelectasis. No pneumothorax. There is an inferior approach dialysis catheter with the tips terminating in the RIGHT atrium. This is new compared to the prior examination. Monitoring leads project over the chest. Gaseous distension of bowel is present. Cardiopericardial silhouette appears within normal limits allowing for lower volumes.  IMPRESSION: Low volume chest. New inferior approach dialysis catheter with the tips terminating in the RIGHT atrium.   Electronically Signed   By: Andreas NewportGeoffrey  Lamke M.D.   On: 05/09/2014 09:50   Dg Fluoro Guide Cv Line-no Report  05/09/2014   CLINICAL DATA: dialysis catheter placement   FLOURO GUIDE CV LINE  Fluoroscopy was utilized by the requesting  physician.  No radiographic  interpretation.     Anti-infectives: Anti-infectives   Start     Dose/Rate Route Frequency Ordered Stop   05/09/14 2100  vancomycin (VANCOCIN) 1,250 mg in sodium chloride 0.9 % 250 mL IVPB  Status:  Discontinued     1,250 mg 166.7 mL/hr over 90 Minutes Intravenous  Once 05/09/14 1951 05/10/14 0908   05/09/14 0600  ceFAZolin (ANCEF) IVPB 1 g/50 mL premix     1 g 100 mL/hr over 30 Minutes Intravenous  Once 05/08/14 0953 05/09/14 0752   05/06/14 1200  cefTAZidime (FORTAZ) 2 g in dextrose 5 % 50 mL IVPB     2 g 100 mL/hr over 30 Minutes Intravenous Every T-Th-Sa (Hemodialysis) 05/06/14 1022 05/27/14 1159   05/06/14 0100  levofloxacin (LEVAQUIN) tablet 750 mg     750 mg Oral  Once 05/06/14 0053 05/06/14 0151   05/04/14 1200  vancomycin (VANCOCIN) 500 mg in sodium chloride 0.9 % 100 mL IVPB  Status:  Discontinued     500 mg 100 mL/hr over 60 Minutes Intravenous Every T-Th-Sa (Hemodialysis) 05/02/14 1758 05/04/14 0708   05/04/14 1200  ceFEPIme (MAXIPIME) 2 g in dextrose 5 % 50 mL IVPB  Status:  Discontinued     2 g 100 mL/hr over 30 Minutes Intravenous Every T-Th-Sa (Hemodialysis) 05/02/14 1758 05/04/14 0708   05/04/14 0800  piperacillin-tazobactam (ZOSYN) IVPB 2.25 g  Status:  Discontinued     2.25 g 100 mL/hr  over 30 Minutes Intravenous 3 times per day 05/04/14 0711 05/06/14 0104   05/02/14 1800  vancomycin (VANCOCIN) 1,250 mg in sodium chloride 0.9 % 250 mL IVPB     1,250 mg 166.7 mL/hr over 90 Minutes Intravenous  Once 05/02/14 1749 05/02/14 2124   05/02/14 1800  ceFEPIme (MAXIPIME) 2 g in dextrose 5 % 50 mL IVPB  Status:  Discontinued     2 g 100 mL/hr over 30 Minutes Intravenous  Once 05/02/14 1749 05/04/14 0708   05/02/14 1745  vancomycin (VANCOCIN) IVPB 1000 mg/200 mL premix  Status:  Discontinued     1,000 mg 200 mL/hr over 60 Minutes Intravenous  Once 05/02/14 1744 05/02/14 1748      Assessment/Plan: s/p Procedure(s): INSERTION OF DIALYSIS  CATHETER- FEMORAL (Right)  Rt fem tunneled dial cath placed by Dr Imogene Burn 7/21 Malfunction in dialysis Now IR to exchange in am We have scheduled with anesthesia for 11 am Consent signed andin chart--per sister [name]   LOS: 8 days    [name] A 05/10/2014

## 2014-05-10 NOTE — Progress Notes (Signed)
Nutrition Brief Note  Patient identified on the Malnutrition Screening Tool Report. Patient's nutrition goal of care is comfort feeds. No nutrition interventions warranted at this time.  Please consult RD as needed.   Maureen ChattersKatie Janelle Culton, RD, LDN Pager #: [phone] After-Hours Pager #: [phone]

## 2014-05-10 NOTE — Progress Notes (Signed)
IV team paged about starting IV in pt. When IV team arrived, RN and IV RN were unable to get pt. To cooperate with IV insertion. IMTS notified. No further action required at this time. Will continue to monitor

## 2014-05-10 NOTE — Progress Notes (Signed)
Patient ZO:XWRU:Emanuell Dossie ArbourW Enke      DOB: 1950-11-21      EAV:[id number]  Jennette Kettleeal is watching TV in bed. Nonverbal at baseline and he did not engage me much today.  Plans for HD catheter tube changing today. Our team has followed for goals of care.   As per Dr Lamar BlinksGolding's note: Goals of care remain to continue HD as long as Jennette Kettleeal has access, treat infections, avoid ICU, DNR and focus on comfort and QOL simultaneously with standard of care for his chronic diseases.

## 2014-05-10 NOTE — Progress Notes (Signed)
Clinical Social Work Department BRIEF PSYCHOSOCIAL ASSESSMENT 05/10/2014  Patient:  [name] ClackHARRIS,[name]     Account Number:  [id number]     Admit date:  05/02/2014  Clinical Social Worker:  Carren RangPURITZ,Santiel Topper, LCSWA  Date/Time:  05/10/2014 01:45 PM  Referred by:  Care Management  Date Referred:  05/10/2014 Referred for  Other - See comment   Other Referral:   [facility]   Interview type:  Other - See comment Other interview type:   CSW spoke to patient's caregiver over the phone and left a voicemail with patient's sister.    PSYCHOSOCIAL DATA Living Status:  FACILITY Admitted from facility:  Other Level of care:   Primary support name:  Margaretmary LombardJanie Mills Primary support relationship to patient:  SIBLING Degree of support available:   Good    CURRENT CONCERNS Current Concerns  Post-Acute Placement   Other Concerns:    SOCIAL WORK ASSESSMENT / PLAN CSW informed pt was potentially admitted from group home.CSW attempted to reach patient's sister, left voicemail.  CSW called second contact on pt facesheet, Dolly Riasecatur [name]. Mr. Toni ArthursFuller is the administrator of [facility] ([facility]). Mr. Toni ArthursFuller states that he is patient's caregiver and patient is able to return back when medically ready. Mr. Toni ArthursFuller states that they are able to manage patient at [facility] and informed social worker to call patient's sister when patient is ready for dc and Mr. Toni ArthursFuller would then come get patient. CSW continues to follow patient for dc plans.   Assessment/plan status:  Psychosocial Support/Ongoing Assessment of Needs Other assessment/ plan:   Information/referral to community resources:   CSW contact information    PATIENT'S/FAMILY'S RESPONSE TO PLAN OF CARE: Patient's caregiver states that patient is able to return back when ready. CSW left voicemail for patient's sister, awaiting call.        Maree KrabbeLindsay Odel Schmid, MSW, Theresia MajorsLCSWA [phone]

## 2014-05-10 NOTE — Progress Notes (Signed)
Subjective: [name] had his R HD catheter exchanged yesterday. When he went for HD in the late afternoon, there was bubbling in the tubing and they suspect a pin sized hole in the line so he did not receive HD. I spoke to IR this morning about changing the tubing and they said they should be able to exchange it this morning. When I interviewed [name], he was awake in bed and engaging.   Objective: Vital signs in last 24 hours: Filed Vitals:   05/09/14 2000 05/10/14 0000 05/10/14 0400 05/10/14 0724  BP: 105/69 105/61 82/56 88/49   Pulse: 111 106 90 92  Temp: 97.6 F (36.4 C) 98 F (36.7 C) 97.8 F (36.6 C) 97.7 F (36.5 C)  TempSrc: Oral Oral Oral Oral  Resp: 17 19 14 20   Height:      Weight:      SpO2: 97% 96% 92% 98%   Weight change:  No intake or output data in the 24 hours ending 05/10/14 0843 BP 88/49  Pulse 92  Temp(Src) 97.7 F (36.5 C) (Oral)  Resp 20  Ht 5' 4"  (1.626 m)  Wt 124 lb 12.5 oz (56.6 kg)  BMI 21.41 kg/m2  SpO2 98%  General Appearance:    Laying in bed with eyes wide open, engaged and alert  HEENT:     moist mucous membranes  Lungs:     Very difficult to appreciate lung sounds as he was noncooperative, respirations unlabored.  Heart:    Regular rate and rhythm, S1 and S2 normal, 2/6 systolic ejection murmur loudest at apex  Abdomen:     Soft, non-tender, bowel sounds active all four quadrants,    no masses, no organomegaly  Extremities:   R groin with bandage c/d/i from HD catheter placement this morning  Pulses:   2+ and symmetric all extremities   Lab Results: Basic Metabolic Panel:  Recent Labs Lab 05/05/14 0500 05/06/14 0250 05/09/14 0750  NA 142 140 137  K 6.1* 4.8 4.5  CL 101 98  --   CO2 21 25  --   GLUCOSE 141* 140*  --   BUN 60* 23  --   CREATININE 8.89* 4.14*  --   CALCIUM 7.3* 7.8*  --   PHOS  --  4.0  --    Liver Function Tests: No results found for this basename: AST, ALT, ALKPHOS, BILITOT, PROT, ALBUMIN,  in the last 168  hours No results found for this basename: AMMONIA,  in the last 168 hours CBC:  Recent Labs Lab 05/05/14 0500 05/06/14 0250 05/09/14 0750  WBC 15.4* 13.0*  --   HGB 8.4* 8.5* 9.2*  HCT 26.6* 26.0* 27.0*  MCV 95.0 89.3  --   PLT 96* 113*  --    Cardiac Enzymes:  Recent Labs Lab 05/03/14 0908 05/03/14 1259  TROPONINI 0.79* 1.10*   CBG:  Recent Labs Lab 05/09/14 1143 05/09/14 1612 05/09/14 2028 05/10/14 0007 05/10/14 0418 05/10/14 0722  GLUCAP 117* 88 101* 108* 103* 88   Coagulation: No results found for this basename: LABPROT, INR,  in the last 168 hours Micro Results: Recent Results (from the past 240 hour(s))  CULTURE, BLOOD (ROUTINE X 2)     Status: None   Collection Time    05/02/14  4:39 PM      Result Value Ref Range Status   Specimen Description BLOOD NECK   Final   Special Requests BOTTLES DRAWN AEROBIC AND ANAEROBIC 5 CC   Final  Culture  Setup Time     Final   Value: 05/02/2014 22:38     Performed at [facility]   Culture     Final   Value: PSEUDOMONAS AERUGINOSA     Note: SUSCEPTIBILITIES PERFORMED ON PREVIOUS CULTURE WITHIN THE LAST 5 DAYS.     Note: Gram Stain Report Called to,Read Back By and Verified With: Rico Sheehan RN [phone]     Performed at [facility]   Report Status 05/06/2014 FINAL   Final  CULTURE, BLOOD (ROUTINE X 2)     Status: None   Collection Time    05/02/14 11:25 PM      Result Value Ref Range Status   Specimen Description BLOOD RIGHT ARM   Final   Special Requests BOTTLES DRAWN AEROBIC ONLY 5CC   Final   Culture  Setup Time     Final   Value: 05/03/2014 08:46     Performed at [facility]   Culture     Final   Value: PSEUDOMONAS AERUGINOSA     1245 Note: Gram Stain Report Called to,Read Back By and Verified With: [facility] 05/04/14 [place]     Performed at [facility]   Report Status 05/06/2014 FINAL   Final   Organism ID, Bacteria PSEUDOMONAS AERUGINOSA   Final  CULTURE,  BLOOD (SINGLE)     Status: None   Collection Time    05/03/14  8:52 AM      Result Value Ref Range Status   Specimen Description BLOOD RIGHT ANTECUBITAL   Final   Special Requests BOTTLES DRAWN AEROBIC ONLY 2CC   Final   Culture  Setup Time     Final   Value: 05/03/2014 13:23     Performed at [facility]   Culture     Final   Value: PSEUDOMONAS AERUGINOSA     Note: SUSCEPTIBILITIES PERFORMED ON PREVIOUS CULTURE WITHIN THE LAST 5 DAYS.     Note: Gram Stain Report Called to,Read Back By and Verified With: STEPHANIE SHAW 05/04/14 1000 BY SMITHERSJ     Performed at [facility]   Report Status 05/06/2014 FINAL   Final  WOUND CULTURE     Status: None   Collection Time    05/04/14 10:48 AM      Result Value Ref Range Status   Specimen Description WOUND RIGHT GROIN   Final   Special Requests NONE   Final   Gram Stain     Final   Value: RARE WBC PRESENT,BOTH PMN AND MONONUCLEAR     NO SQUAMOUS EPITHELIAL CELLS SEEN     NO ORGANISMS SEEN     Performed at [facility]   Culture     Final   Value: FEW PSEUDOMONAS AERUGINOSA     Performed at [facility]   Report Status 05/07/2014 FINAL   Final   Organism ID, Bacteria PSEUDOMONAS AERUGINOSA   Final  CULTURE, BLOOD (SINGLE)     Status: None   Collection Time    05/05/14  6:50 PM      Result Value Ref Range Status   Specimen Description BLOOD RIGHT HAND   Final   Special Requests BOTTLES DRAWN AEROBIC ONLY 3CC   Final   Culture  Setup Time     Final   Value: 05/05/2014 21:57     Performed at [facility]   Culture     Final   Value:  BLOOD CULTURE RECEIVED NO GROWTH TO DATE CULTURE WILL BE HELD FOR 5 DAYS BEFORE ISSUING A FINAL NEGATIVE REPORT     Performed at [facility]   Report Status PENDING   Incomplete  CATH TIP CULTURE     Status: None   Collection Time    05/07/14  9:01 AM      Result Value Ref Range Status   Specimen Description CATH TIP RIGHT FEMORAL [place] CATH TIP    Final   Special Requests Normal   Final   Culture     Final   Value: 2 COLONIES PSEUDOMONAS AERUGINOSA     Performed at [facility]   Report Status PENDING   Incomplete   Studies/Results:  Medications: I have reviewed the patient's current medications. Scheduled Meds: . cefTAZidime (FORTAZ)  IV  2 g Intravenous Q T,Th,Sa-HD  . cinacalcet  30 mg Oral BID WC  . darbepoetin (ARANESP) injection - DIALYSIS  100 mcg Intravenous Q Thu-HD  . doxercalciferol  10 mcg Intravenous Q T,Th,Sa-HD  . fentaNYL  12.5 mcg Intravenous Once  . midodrine  15 mg Oral TID  . multivitamin  1 tablet Oral QHS  . pantoprazole  40 mg Oral Daily  . phenytoin  200 mg Oral QHS  . [START ON 05/11/2014] predniSONE  10 mg Oral Q breakfast  . sevelamer carbonate  800 mg Oral TID WC  . sodium chloride  3 mL Intravenous Q12H  . vancomycin  1,250 mg Intravenous Once   Continuous Infusions:  PRN Meds:.acetaminophen, acetaminophen, heparin Assessment/Plan:  Sepsis 2/2 Pseudomonas bacteremia: Pt originally met all SIRS criteria.This infection may be due to his recently displaced R groin HD catheter although the site does not look infected. PCCM was consulted when pt continued to be hypotensive and given access issues will not be able to provide pressor support despite HCPOA wishes to continue with pressors this continues to be an issue especially since the patient lost all PIV access at this time as well. Dr. Kellie Simmering of vascular surgery evaluated the patient and felt the catheter should only be removed as last resort. He had his R femoral HD cath removed and it was replaced yesterday. Patient currently on ceftazidime 2 g w HD T/Th/Sa for pseudomonas. Dr Moshe Cipro saw him and says he can receive iv fortaz with outpatient HD.  BCx was drawn once the catheter was removed. Afebrile overnight. -appreciate vascular surgery, critical care, palliative care input  -zosyn 2.25 g q8h>> was transitioned to oral levaquin q48h>>IV  ceftazidime 2 g with HD -repeat BCx f/u -midodrine 15 mg TID  ESRD on FM:BWGYK is still grave concern that HD cath may become nonfunctional at any point which will terminate pt ability for continued HD per nephrology. When he went for HD late afternoon, the tubing had bubbles and HD was deferred. I spoke to IR who put him on the list for tubing changing this morning. We will let Dr. Moshe Cipro know so that he may be added on to HD this afternoon pending IR. -appreciate renal, vascular, IR -I/Os  -TTS HD, likely extra session today -vascular surgery consulted and do not recommend and will not be pursing new graft access pt has truly reached his end access, R HD catheter changed this morning -Continue Cinacalcet, Dialyvite, Renvela   Acquired Adrenal Insufficiency: Patient has documented adrenal insufficiency, on Midodrine 15 mg tid at home. Was discharged on 10 day prednisone taper which was not completed.  Given Solu-Cortef 100 IV twice.  Hypotension likely contributed to by adrenal insufficiency. He has been slowly tapered off steroid. -hydrocortisone 50 mg po bid>>50 mg po>> 10 mg prednisone today  Hypotension: Patient is chronically hypotensive with unknown etiology but likely due to acquired adrenal insufficiency v acute shock. His BP overnight/this morning was 80s-100s/50s-60s.On midodrine 15 mg TID. Steroids can be tapered as BP is normal/high for him. -Continue midodrine 15 mg TID  -prednisone 10 mg po daily -palliative support if becomes symptomatic   History of seizures: No recent seizures. Unlikely that acute mental status change was a result of seizure since pt has respiratory distress and hypotension. Pt back at mental status baseline. On phenytoin 200 mg daily at home. Dilantin level was undetectable in ED.  -cont dilantin  -Seizure precautions  -Neuro checks   Mental retardation: Patient appears back at baseline from chart review and per family. Non-verbal, but grunts. He  maintained intermittent eye contact with me  Dispo: Disposition is deferred at this time, awaiting improvement of current medical problems.  Anticipated discharge in approximately 3 day(s).   The patient does have a current PCP Corky Sox, MD) and does need an [facility] hospital follow-up appointment after discharge.  The patient does have transportation limitations that hinder transportation to clinic appointments.  .Services Needed at time of discharge: Y = Yes, Blank = No PT:   OT:   RN:   Equipment:   Other:     LOS: 8 days   Kelby Aline, MD 05/10/2014, 8:43 AM

## 2014-05-11 ENCOUNTER — Encounter (HOSPITAL_COMMUNITY)
Admission: EM | Disposition: A | Discharge status: Home / Self Care | Payer: Self-pay | Source: Home / Self Care | Attending: Oncology

## 2014-05-11 ENCOUNTER — Inpatient Hospital Stay (HOSPITAL_COMMUNITY): Payer: Medicare Other | Admitting: Certified Registered"

## 2014-05-11 ENCOUNTER — Inpatient Hospital Stay (HOSPITAL_COMMUNITY): Payer: Medicare Other

## 2014-05-11 ENCOUNTER — Encounter (HOSPITAL_COMMUNITY): Payer: Medicare Other | Admitting: Certified Registered"

## 2014-05-11 ENCOUNTER — Encounter (HOSPITAL_COMMUNITY): Payer: Self-pay | Admitting: Certified Registered"

## 2014-05-11 DIAGNOSIS — A5433 Gonococcal keratitis: Secondary | ICD-10-CM

## 2014-05-11 HISTORY — PX: RADIOLOGY WITH ANESTHESIA: SHX6223

## 2014-05-11 LAB — GLUCOSE, CAPILLARY
GLUCOSE-CAPILLARY: 89 mg/dL (ref 70–99)
GLUCOSE-CAPILLARY: 125 mg/dL — AB (ref 70–99)
GLUCOSE-CAPILLARY: 78 mg/dL (ref 70–99)
GLUCOSE-CAPILLARY: 78 mg/dL (ref 70–99)
Glucose-Capillary: 75 mg/dL (ref 70–99)
Glucose-Capillary: 64 mg/dL — ABNORMAL LOW (ref 70–99)
Glucose-Capillary: 106 mg/dL — ABNORMAL HIGH (ref 70–99)

## 2014-05-11 LAB — CULTURE, BLOOD (SINGLE): CULTURE: NO GROWTH

## 2014-05-11 LAB — CATH TIP CULTURE
Culture: 2
SPECIAL REQUESTS: NORMAL

## 2014-05-11 SURGERY — RADIOLOGY WITH ANESTHESIA
Anesthesia: Monitor Anesthesia Care

## 2014-05-11 MED ORDER — DOXERCALCIFEROL 4 MCG/2ML IV SOLN
INTRAVENOUS | Status: AC
  Filled 2014-05-11: qty 6

## 2014-05-11 MED ORDER — VANCOMYCIN HCL 10 G IV SOLR
1250.0000 mg | Freq: Once | INTRAVENOUS | Status: AC
  Administered 2014-05-11: 1250 mg via INTRAVENOUS
  Filled 2014-05-11: qty 1250

## 2014-05-11 MED ORDER — DEXTROSE 50 % IV SOLN
12.5000 g | Freq: Once | INTRAVENOUS | Status: DC
  Filled 2014-05-11: qty 50

## 2014-05-11 MED ORDER — SODIUM CHLORIDE 0.9 % IV SOLN
INTRAVENOUS | Status: DC | PRN
  Administered 2014-05-11: 12:00:00 via INTRAVENOUS

## 2014-05-11 MED ORDER — PHENYLEPHRINE HCL 10 MG/ML IJ SOLN
INTRAMUSCULAR | Status: DC | PRN
  Administered 2014-05-11: 80 ug via INTRAVENOUS
  Administered 2014-05-11: 160 ug via INTRAVENOUS
  Administered 2014-05-11 (×2): 80 ug via INTRAVENOUS

## 2014-05-11 MED ORDER — FENTANYL CITRATE 0.05 MG/ML IJ SOLN
INTRAMUSCULAR | Status: AC
  Filled 2014-05-11: qty 5

## 2014-05-11 MED ORDER — CEFAZOLIN SODIUM-DEXTROSE 2-3 GM-% IV SOLR
INTRAVENOUS | Status: AC
  Administered 2014-05-11: 2 g via INTRAVENOUS
  Filled 2014-05-11: qty 50

## 2014-05-11 MED ORDER — PROPOFOL 10 MG/ML IV BOLUS
INTRAVENOUS | Status: AC
  Filled 2014-05-11: qty 20

## 2014-05-11 MED ORDER — MIDAZOLAM HCL 2 MG/2ML IJ SOLN
INTRAMUSCULAR | Status: DC | PRN
  Administered 2014-05-11: 2 mg via INTRAVENOUS

## 2014-05-11 MED ORDER — FENTANYL CITRATE 0.05 MG/ML IJ SOLN
INTRAMUSCULAR | Status: DC | PRN
  Administered 2014-05-11: 50 ug via INTRAVENOUS
  Administered 2014-05-11 (×2): 25 ug via INTRAVENOUS

## 2014-05-11 MED ORDER — DEXTROSE 50 % IV SOLN
INTRAVENOUS | Status: AC
  Filled 2014-05-11: qty 50

## 2014-05-11 MED ORDER — PROPOFOL 10 MG/ML IV BOLUS
INTRAVENOUS | Status: DC | PRN
  Administered 2014-05-11: 30 mg via INTRAVENOUS
  Administered 2014-05-11: 20 mg via INTRAVENOUS

## 2014-05-11 MED ORDER — DARBEPOETIN ALFA-POLYSORBATE 100 MCG/0.5ML IJ SOLN
INTRAMUSCULAR | Status: AC
  Filled 2014-05-11: qty 0.5

## 2014-05-11 MED ORDER — MIDAZOLAM HCL 2 MG/2ML IJ SOLN
INTRAMUSCULAR | Status: AC
Start: 2014-05-11 — End: 2014-05-11
  Filled 2014-05-11: qty 2

## 2014-05-11 MED ORDER — HEPARIN SODIUM (PORCINE) 1000 UNIT/ML IJ SOLN
INTRAMUSCULAR | Status: AC
  Filled 2014-05-11: qty 1

## 2014-05-11 NOTE — Progress Notes (Signed)
Subjective: [name] [name] needs his R HD catheter tubing exchanged via IR but requires level of anesthesia that would not be available until this morning. He missed his last HD session because of this issue. This morning he was lying in bed watching television. He was calm but still engaging and cooperative.   Objective: Vital signs in last 24 hours: Filed Vitals:   05/10/14 1200 05/10/14 1601 05/10/14 2033 05/11/14 0429  BP: 114/53 100/49 103/70 105/64  Pulse: 89 94 93 90  Temp:  97.4 F (36.3 C) 97.5 F (36.4 C) 97.6 F (36.4 C)  TempSrc:  Oral Oral Oral  Resp:  18 18 18   Height:      Weight:   124 lb 12.5 oz (56.6 kg)   SpO2: 96%  96% 96%   Weight change:   Intake/Output Summary (Last 24 hours) at 05/11/14 0952 Last data filed at 05/11/14 0438  Gross per 24 hour  Intake      0 ml  Output      1 ml  Net     -1 ml   BP 105/64  Pulse 90  Temp(Src) 97.6 F (36.4 C) (Oral)  Resp 18  Ht 5' 4"  (1.626 m)  Wt 124 lb 12.5 oz (56.6 kg)  BMI 21.41 kg/m2  SpO2 96%  General Appearance:    Laying in bed watching tv, not agitated, is engaged and alert  HEENT:     moist mucous membranes  Lungs:     Very difficult to appreciate lung sounds as he was noncooperative, respirations unlabored.  Heart:    Regular rate and rhythm, S1 and S2 normal, 2/6 systolic ejection murmur loudest at apex  Abdomen:     Soft, non-tender, bowel sounds active all four quadrants,    no masses, no organomegaly  Extremities:   R groin with bandage c/d/i from HD catheter w note not to use due to hole  Pulses:   2+ and symmetric all extremities   Lab Results: Basic Metabolic Panel:  Recent Labs Lab 05/05/14 0500 05/06/14 0250 05/09/14 0750  NA 142 140 137  K 6.1* 4.8 4.5  CL 101 98  --   CO2 21 25  --   GLUCOSE 141* 140*  --   BUN 60* 23  --   CREATININE 8.89* 4.14*  --   CALCIUM 7.3* 7.8*  --   PHOS  --  4.0  --    Liver Function Tests: No results found for this basename: AST, ALT, ALKPHOS,  BILITOT, PROT, ALBUMIN,  in the last 168 hours No results found for this basename: AMMONIA,  in the last 168 hours CBC:  Recent Labs Lab 05/05/14 0500 05/06/14 0250 05/09/14 0750  WBC 15.4* 13.0*  --   HGB 8.4* 8.5* 9.2*  HCT 26.6* 26.0* 27.0*  MCV 95.0 89.3  --   PLT 96* 113*  --    Cardiac Enzymes: No results found for this basename: CKTOTAL, CKMB, CKMBINDEX, TROPONINI,  in the last 168 hours CBG:  Recent Labs Lab 05/10/14 1137 05/10/14 1556 05/10/14 2030 05/10/14 2354 05/11/14 0432 05/11/14 0740  GLUCAP 96 100* 93 91 89 75   Coagulation: No results found for this basename: LABPROT, INR,  in the last 168 hours Micro Results: Recent Results (from the past 240 hour(s))  CULTURE, BLOOD (ROUTINE X 2)     Status: None   Collection Time    05/02/14  4:39 PM      Result Value Ref Range Status  Specimen Description BLOOD NECK   Final   Special Requests BOTTLES DRAWN AEROBIC AND ANAEROBIC 5 CC   Final   Culture  Setup Time     Final   Value: 05/02/2014 22:38     Performed at [facility]   Culture     Final   Value: PSEUDOMONAS AERUGINOSA     Note: SUSCEPTIBILITIES PERFORMED ON PREVIOUS CULTURE WITHIN THE LAST 5 DAYS.     Note: Gram Stain Report Called to,Read Back By and Verified With: Rico Sheehan RN [phone]     Performed at [facility]   Report Status 05/06/2014 FINAL   Final  CULTURE, BLOOD (ROUTINE X 2)     Status: None   Collection Time    05/02/14 11:25 PM      Result Value Ref Range Status   Specimen Description BLOOD RIGHT ARM   Final   Special Requests BOTTLES DRAWN AEROBIC ONLY 5CC   Final   Culture  Setup Time     Final   Value: 05/03/2014 08:46     Performed at [facility]   Culture     Final   Value: PSEUDOMONAS AERUGINOSA     1245 Note: Gram Stain Report Called to,Read Back By and Verified With: [facility] 05/04/14 [place]     Performed at [facility]   Report Status 05/06/2014 FINAL   Final   Organism ID,  Bacteria PSEUDOMONAS AERUGINOSA   Final  CULTURE, BLOOD (SINGLE)     Status: None   Collection Time    05/03/14  8:52 AM      Result Value Ref Range Status   Specimen Description BLOOD RIGHT ANTECUBITAL   Final   Special Requests BOTTLES DRAWN AEROBIC ONLY 2CC   Final   Culture  Setup Time     Final   Value: 05/03/2014 13:23     Performed at [facility]   Culture     Final   Value: PSEUDOMONAS AERUGINOSA     Note: SUSCEPTIBILITIES PERFORMED ON PREVIOUS CULTURE WITHIN THE LAST 5 DAYS.     Note: Gram Stain Report Called to,Read Back By and Verified With: STEPHANIE SHAW 05/04/14 1000 BY SMITHERSJ     Performed at [facility]   Report Status 05/06/2014 FINAL   Final  WOUND CULTURE     Status: None   Collection Time    05/04/14 10:48 AM      Result Value Ref Range Status   Specimen Description WOUND RIGHT GROIN   Final   Special Requests NONE   Final   Gram Stain     Final   Value: RARE WBC PRESENT,BOTH PMN AND MONONUCLEAR     NO SQUAMOUS EPITHELIAL CELLS SEEN     NO ORGANISMS SEEN     Performed at [facility]   Culture     Final   Value: FEW PSEUDOMONAS AERUGINOSA     Performed at [facility]   Report Status 05/07/2014 FINAL   Final   Organism ID, Bacteria PSEUDOMONAS AERUGINOSA   Final  CULTURE, BLOOD (SINGLE)     Status: None   Collection Time    05/05/14  6:50 PM      Result Value Ref Range Status   Specimen Description BLOOD RIGHT HAND   Final   Special Requests BOTTLES DRAWN AEROBIC ONLY 3CC   Final   Culture  Setup Time     Final   Value: 05/05/2014 21:57  Performed at [facility]     Final   Value: NO GROWTH 5 DAYS     Performed at [facility]   Report Status 05/11/2014 FINAL   Final  CATH TIP CULTURE     Status: None   Collection Time    05/07/14  9:01 AM      Result Value Ref Range Status   Specimen Description CATH TIP RIGHT FEMORAL DIATEK CATH TIP   Final   Special Requests Normal   Final    Culture     Final   Value: 2 COLONIES PSEUDOMONAS AERUGINOSA     Performed at [facility]   Report Status 05/11/2014 FINAL   Final  CULTURE, BLOOD (SINGLE)     Status: None   Collection Time    05/09/14  6:30 PM      Result Value Ref Range Status   Specimen Description BLOOD HEMODIALYSIS CATHETER   Final   Special Requests BOTTLES DRAWN AEROBIC AND ANAEROBIC 10CC   Final   Culture  Setup Time     Final   Value: 05/10/2014 00:48     Performed at [facility]   Culture     Final   Value:        BLOOD CULTURE RECEIVED NO GROWTH TO DATE CULTURE WILL BE HELD FOR 5 DAYS BEFORE ISSUING A FINAL NEGATIVE REPORT     Performed at [facility]   Report Status PENDING   Incomplete   Studies/Results:  Medications: I have reviewed the patient's current medications. Scheduled Meds: . cefTAZidime (FORTAZ)  IV  2 g Intravenous Q T,Th,Sa-HD  . cinacalcet  30 mg Oral BID WC  . darbepoetin (ARANESP) injection - DIALYSIS  100 mcg Intravenous Q Thu-HD  . doxercalciferol  10 mcg Intravenous Q T,Th,Sa-HD  . fentaNYL  12.5 mcg Intravenous Once  . midodrine  15 mg Oral TID  . multivitamin  1 tablet Oral QHS  . pantoprazole  40 mg Oral Daily  . phenytoin  200 mg Oral QHS  . predniSONE  10 mg Oral Q breakfast  . sevelamer carbonate  800 mg Oral TID WC  . sodium chloride  3 mL Intravenous Q12H   Continuous Infusions:  PRN Meds:.acetaminophen, acetaminophen, heparin Assessment/Plan:  Sepsis 2/2 Pseudomonas bacteremia: Pt originally met all SIRS criteria.This infection may be due to his recently displaced R groin HD catheter although the site does not look infected. PCCM was consulted when pt continued to be hypotensive and given access issues will not be able to provide pressor support despite HCPOA wishes to continue with pressors this continues to be an issue especially since the patient lost all PIV access at this time as well. Dr. Kellie Simmering of vascular surgery evaluated the patient  and felt the catheter should only be removed as last resort. He had his R femoral HD cath removed and it was replaced yesterday. Patient currently on ceftazidime 2 g w HD T/Th/Sa for pseudomonas. Dr Moshe Cipro saw him and says he can receive iv fortaz with outpatient HD.  Needs tubing replaced by IR today for access. BCx was drawn once the catheter was removed w NGTD. Afebrile overnight. -appreciate vascular surgery, critical care, palliative care input  -zosyn 2.25 g q8h>> was transitioned to oral levaquin q48h>>IV ceftazidime 2 g with HD x 14 days per ID -repeat BCx final results -midodrine 15 mg TID  ESRD on JK:DTOIZ is still grave concern that HD cath may become nonfunctional  at any point which will terminate pt ability for continued HD per nephrology. When he went for HD late afternoon on 7/21, the tubing had bubbles and HD was deferred. I spoke to IR who say he is scheduled for tubing replacement w adequate anesthesia this morning. We will let Dr. Moshe Cipro know so that he may be added on to HD this afternoon pending IR.  -appreciate renal, vascular, IR -I/Os  -TTS HD -vascular surgery consulted and do not recommend and will not be pursing new graft access pt has truly reached his end access -Continue Cinacalcet, Dialyvite, Renvela  -cardiac monitoring and EKG for possible hyperkalemia in setting of missed HD  Acquired Adrenal Insufficiency: Patient has documented adrenal insufficiency, on Midodrine 15 mg tid at home. Was discharged on 10 day prednisone taper which was not completed.  Given Solu-Cortef 100 IV twice.  Hypotension likely contributed to by adrenal insufficiency. He has been slowly tapered off steroid. -hydrocortisone 50 mg po bid>>50 mg po>> 10 mg prednisone this morning  Hypotension: Patient is chronically hypotensive with unknown etiology but likely due to acquired adrenal insufficiency v acute shock. His BP overnight/this morning was 100s-110s/40s-70s.On midodrine 15 mg  TID. Steroids can be tapered as BP is normal/high for him. -Continue midodrine 15 mg TID  -prednisone 10 mg po daily w taper -palliative support if becomes symptomatic   History of seizures: No recent seizures. Unlikely that acute mental status change was a result of seizure since pt has respiratory distress and hypotension. Pt back at mental status baseline. On phenytoin 200 mg daily at home. Dilantin level was undetectable in ED.  -cont dilantin  -Seizure precautions  -Neuro checks   Mental retardation: Patient appears back at baseline from chart review and per family. Non-verbal, but grunts. He maintained intermittent eye contact with me  Dispo: Disposition is deferred at this time, awaiting improvement of current medical problems.  Anticipated discharge in approximately 1-2 day(s).   The patient does have a current PCP Corky Sox, MD) and does need an [facility] hospital follow-up appointment after discharge.  The patient does have transportation limitations that hinder transportation to clinic appointments.  .Services Needed at time of discharge: Y = Yes, Blank = No PT:   OT:   RN:   Equipment:   Other:     LOS: 9 days   Kelby Aline, MD 05/11/2014, 9:52 AM

## 2014-05-11 NOTE — Progress Notes (Signed)
Patient placed on telemetry box #4.   Leanna BattlesEckelmann, Marriah Sanderlin Eileen, RN.

## 2014-05-11 NOTE — Anesthesia Procedure Notes (Signed)
Procedure Name: MAC Date/Time: 05/11/2014 11:54 AM Performed by: De NurseENNIE, Bensen Chadderdon E Pre-anesthesia Checklist: Patient identified, Emergency Drugs available, Suction available, Patient being monitored and Timeout performed Patient Re-evaluated:Patient Re-evaluated prior to inductionOxygen Delivery Method: Nasal cannula

## 2014-05-11 NOTE — Progress Notes (Signed)
IV Vancomycin dose given at 1551 prior to dialysis per pharmacist orders. This was verified with pharmacy prior to administering medication. Informed HD RN.   Leanna BattlesEckelmann, Laderrick Wilk Eileen, RN.

## 2014-05-11 NOTE — Progress Notes (Signed)
  PROGRESS NOTE MEDICINE TEACHING ATTENDING   Day 9 of stay Patient name: [name]   Medical record number: [id number] Date of birth: 1950/11/15    I met with [name] personally and evaluated him today. I have reviewed Dr Bebe Shaggy note about him and I agree with the plan of care. I have discussed the care of this patient with my IM team residents. Please see the resident note for details.  [place], [place] 05/11/2014, 3:04 PM.

## 2014-05-11 NOTE — Anesthesia Preprocedure Evaluation (Addendum)
Anesthesia Evaluation  Patient identified by MRN, date of birth, ID band Patient awake  General Assessment Comment:Pt is mute.  Reviewed: Allergy & Precautions, H&P , NPO status , Patient's Chart, lab work & pertinent test results  History of Anesthesia Complications (+) history of anesthetic complications  Airway Mallampati: II  Neck ROM: full    Dental   Pulmonary          Cardiovascular hypertension, + Peripheral Vascular Disease and +CHF     Neuro/Psych Seizures -,  Mental retardation.    GI/Hepatic PUD, GERD-  ,Esophageal varicies   Endo/Other    Renal/GU ESRF and DialysisRenal disease     Musculoskeletal   Abdominal   Peds  Hematology   Anesthesia Other Findings   Reproductive/Obstetrics                          Anesthesia Physical Anesthesia Plan  ASA: IV  Anesthesia Plan: MAC   Post-op Pain Management:    Induction: Intravenous  Airway Management Planned: Simple Face Mask  Additional Equipment:   Intra-op Plan:   Post-operative Plan:   Informed Consent: I have reviewed the patients History and Physical, chart, labs and discussed the procedure including the risks, benefits and alternatives for the proposed anesthesia with the patient or authorized representative who has indicated his/her understanding and acceptance.     Plan Discussed with: CRNA, Anesthesiologist and Surgeon  Anesthesia Plan Comments:         Anesthesia Quick Evaluation

## 2014-05-11 NOTE — Discharge Summary (Signed)
Name: [name] MRN: [id number] DOB: 06/14/1951 63 y.o. PCP: Corky Sox, MD  Date of Admission: 05/02/2014  4:14 PM Date of Discharge: 05/12/2014 Attending Physician: Madilyn Fireman, MD  Discharge Diagnosis:  Principal Problem:   Sepsis due to Pseudomonas species Active Problems:   HYPOTENSION   ESRD (end stage renal disease)   Respiratory distress   Acute encephalopathy   SIRS (systemic inflammatory response syndrome)   Septicemia   Bacterial infection due to Proteus mirabilis  Discharge Medications:   Medication List         atorvastatin 20 MG tablet  Commonly known as:  LIPITOR  Take 20 mg by mouth at bedtime.     cefTAZidime 2 g in dextrose 5 % 50 mL  Inject 2 g into the vein Every Tuesday,Thursday,and Saturday with dialysis.     cinacalcet 30 MG tablet  Commonly known as:  SENSIPAR  Take 30 mg by mouth daily with breakfast.     clonazePAM 0.5 MG tablet  Commonly known as:  KLONOPIN  Take 0.25 mg by mouth 2 (two) times daily.     folic acid-vitamin b complex-vitamin c-selenium-zinc 3 MG Tabs tablet  Take 1 tablet by mouth daily.     midodrine 10 MG tablet  Commonly known as:  PROAMATINE  Take 15 mg by mouth 3 (three) times daily.     pantoprazole 40 MG tablet  Commonly known as:  PROTONIX  Take 40 mg by mouth daily.     phenytoin 100 MG ER capsule  Commonly known as:  DILANTIN  Take 200 mg by mouth at bedtime.     sevelamer carbonate 800 MG tablet  Commonly known as:  RENVELA  Take 800 mg by mouth 3 (three) times daily with meals.        Disposition and follow-up:   [name] was discharged from [facility] in Stable condition.  At the hospital follow up visit please address:  1.  Family goals of care, any symptoms of infection, HD/access issues  2.  Labs / imaging needed at time of follow-up: cbc (see hospital course below)  3.  Pending labs/ test needing follow-up: Blood culture final report  (NGTD)  Follow-up Appointments: Follow-up Information   Follow up with Arman Filter, MD On 05/17/2014. ( at 2:15)    Specialty:  Internal Medicine   Contact information:   [place] [address] [phone]       Discharge Instructions: Discharge Instructions   Diet - low sodium heart healthy    Complete by:  As directed      Increase activity slowly    Complete by:  As directed            Consultations: Treatment Team:  Sol Blazing, MD [facility]  Procedures Performed:  Ir Cyndy Freeze Guide Cv Line Right  05/11/2014   CLINICAL DATA:  63 year old with end-stage renal disease. Right groin Perm Cath is malfunctioning.  EXAM: EXCHANGE OF TUNNELED DIALYSIS CATHETER WITH FLUOROSCOPY  Physician: Stephan Minister. Henn, MD  FLUOROSCOPY TIME:  4 min and 6 seconds  MEDICATIONS: Patient was sedated and monitored by Anesthesia. Ancef 2 g. As antibiotic prophylaxis, Ancef was ordered pre-procedure and administered intravenously within one hour of incision.  ANESTHESIA/SEDATION: Patient was sedated and monitored by Anesthesia.  PROCEDURE: Informed consent was obtained for a catheter exchange. The right groin and existing catheter were prepped and draped in sterile fashion. Maximal barrier sterile technique was utilized  including caps, mask, sterile gowns, sterile gloves, sterile drape, hand hygiene and skin antiseptic. The retention sutures were removed. Catheter was removed over a stiff Glidewire. The distal lumen is found to be occluded with clot. A 42 cm tip to cuff HemoSplit catheter was advanced over the wire. The catheter was hubbed in the right groin but the tip was only in the upper IVC. As result, this catheter was removed over the wire. A 50 cm Medcomp Split Cath was advanced over the wire. The tip was placed within the right atrium. The lumens aspirated and flushed well. Appropriate amount of heparin was placed in both lumens. The catheter was sutured to the skin. Sterile  dressing was placed. Fluoroscopic images were taken and saved for this procedure.  FINDINGS: New catheter tip in the lower right atrium. Both lumens aspirated and flushed well.  COMPLICATIONS: None  IMPRESSION: Successful exchange of the right groin tunneled dialysis catheter with fluoroscopy.   Electronically Signed   By: Markus Daft M.D.   On: 05/11/2014 16:22   Dg Chest Port 1 View  05/09/2014   CLINICAL DATA:  Dialysis catheter placement.  EXAM: PORTABLE CHEST - 1 VIEW  COMPARISON:  05/03/2014.  FINDINGS: Low volume chest with bilateral basilar atelectasis. No pneumothorax. There is an inferior approach dialysis catheter with the tips terminating in the RIGHT atrium. This is new compared to the prior examination. Monitoring leads project over the chest. Gaseous distension of bowel is present. Cardiopericardial silhouette appears within normal limits allowing for lower volumes.  IMPRESSION: Low volume chest. New inferior approach dialysis catheter with the tips terminating in the RIGHT atrium.   Electronically Signed   By: Dereck Ligas M.D.   On: 05/09/2014 09:50   Dg Chest Port 1 View  05/03/2014   CLINICAL DATA:  Shortness of breath and fever  EXAM: PORTABLE CHEST - 1 VIEW  COMPARISON:  05/02/2014  FINDINGS: No cardiomegaly. Unchanged mediastinal contours. There is no edema, consolidation, effusion, or pneumothorax. Dialysis catheter from below, tip at the level of the infrahepatic cava.  IMPRESSION: No active disease.   Electronically Signed   By: Jorje Guild M.D.   On: 05/03/2014 03:26   Dg Chest Portable 1 View  05/02/2014   CLINICAL DATA:  Altered mental status ; history of heart murmur and CHF and hypertension  EXAM: PORTABLE CHEST - 1 VIEW  COMPARISON:  Portable chest x-ray of April 29, 2014  FINDINGS: The lungs are mildly hypoinflated. There is no focal infiltrate. The heart and pulmonary vascularity are unremarkable. There is no pleural effusion. There is moderate gaseous distention of the  stomach. The bony thorax is unremarkable.  IMPRESSION: There is bilateral pulmonary hypoinflation but no evidence of acute cardiopulmonary abnormality.   Electronically Signed   By: David  Martinique   On: 05/02/2014 16:55   Dg Chest Portable 1 View  04/29/2014   CLINICAL DATA:  Dialysis catheter removed.  EXAM: PORTABLE CHEST - 1 VIEW  COMPARISON:  04/24/2014.  FINDINGS: Mediastinum and hilar structures are normal. Subsegmental atelectasis. No pleural effusion or pneumothorax. No acute osseous abnormality.  IMPRESSION: Bibasilar subsegmental atelectasis. No acute cardiopulmonary disease.   Electronically Signed   By: Marcello Moores  Register   On: 04/29/2014 18:04   Dg Chest Port 1 View  04/24/2014   CLINICAL DATA:  Elevated white blood cell count.  EXAM: PORTABLE CHEST - 1 VIEW  COMPARISON:  Chest x-ray 03/29/2014.  FINDINGS: Mediastinum and hilar structures normal. Poor inspiration with bibasilar atelectasis and/or  infiltrates. Bibasilar pneumonia suspected. Small left pleural effusion cannot be excluded. No pneumothorax. Heart size normal.  IMPRESSION: 1. Findings consistent with poor inspiration with bibasilar atelectasis and/or pneumonia, particularly on the left.  2.  Small left pleural effusion.   Electronically Signed   By: Marcello Moores  Register   On: 04/24/2014 14:09   Dg Chest Port 1 View  04/17/2014   CLINICAL DATA:  Clinical pneumonitis -pneumonia  EXAM: PORTABLE CHEST - 1 VIEW  COMPARISON:  Portable chest x-ray of April 10, 2014  FINDINGS: The lungs are adequately inflated. The interstitial markings have increased bilaterally. The hemidiaphragms remain visible. There is no pleural effusion. The cardiac silhouette is top-normal in size. The central pulmonary vascularity is mildly prominent. The bony structures are unremarkable.  IMPRESSION: New mild pulmonary interstitial edema of cardiac or noncardiac cause.   Electronically Signed   By: David  Martinique   On: 04/17/2014 08:13   Dg Abd Portable 1v  04/11/2014    CLINICAL DATA:  Femoral hemodialysis catheter  EXAM: PORTABLE ABDOMEN - 1 VIEW  COMPARISON:  Portable exam 1759 hr compared to 12/14/2012  FINDINGS: Dual lumen RIGHT femoral dialysis catheter with tip projecting at L1-L2 disc space level.  LEFT femoral line tip projects over LEFT supra-acetabular region.  Visualized bowel gas pattern normal.  Minimally prominent stool in rectum.  Bones demineralized.  IMPRESSION: Tip of RIGHT femoral line projects at L1-L2 disc space.  Additional LEFT femoral line tip projecting over LEFT supra-acetabular region.   Electronically Signed   By: Lavonia Dana M.D.   On: 04/11/2014 18:09   Admission HPI: [name] is a 63 y.o. Males with PMHx of mental retardation, ESRD on dialysis, hypotension, acquired adrenal insufficiency, anemia, and history of seizures who presents to the ED from dialysis center for acute change in mental status. This history is taken from ED note and from the pt's sister. Pt was two hours into dialysis and had a sudden change in mental status. Pt would not respond as normal. Normally pt is nonverbal at baseline, but will grunt in response. Staff said he wouldn't grunt, sit up on his own or track with his eyes. His BP was noted to be low at that time and he was given 1L IV bolus. His symptoms did not improve and EMS was called. [name] states that 4 days ago pt pulled at his R femoral HD catheter, and pulled it partially out. He had it replaced the next day, Sunday the 11th by Dr. Kellie Simmering, vascular surgeon, without complication. The next day, Monday the 12th, pt's sister and caretaker noted that he was more sluggish, hard to get out of bed, and wasn't eating. Eventually, they were able to get him to eat and he was doing okay the past 2 days, until today when his blood pressure dropped at dialysis, and he developed shortness of breath and had an acute change in mental status.    Hospital Course by problem list: Principal Problem:   Sepsis due to  Pseudomonas species Active Problems:   HYPOTENSION   ESRD (end stage renal disease)   Respiratory distress   Acute encephalopathy   SIRS (systemic inflammatory response syndrome)   Septicemia   Bacterial infection due to Proteus mirabilis   #Sepsis 2/2 Pseudomonas bacteremia: [name] originally met all SIRS criteria.This infection may be due to his recently displaced R groin HD catheter although the site did not appear infected on exam. He required BiPAP on presentation with a lactic acid level  of 5.75 initially and troponin I was elevated at 0.79-1.1 but EKGs were unchanged from previous tracings suggesting elevated enzymes in setting of demand ischemia from hypotension. PCCM was consulted due to patient's hypotension and and given his access issues would not be able to provide pressor support despite HCPOA sister wishes to continue with pressors (see below). Dr. Kellie Simmering of vascular surgery evaluated the patient and felt the catheter should only be removed as last resort. [name] began broad spectrum antibiotic therapy with vancomycin/zosyn with the vanc discontinued when BCx grew GNR. Zosyn was changed to ceftazidime with culture speciation of Pseudomonas on 7/18. He had his R femoral HD cath removed and replaced on 7/21. Blood culture drawn from the new catheter has no growth to date but final report should be confirmed after discharge. Patient currently on ceftazidime 2 g w HD T/Th/Sa for pseudomonas for 14 days (end 05/23/14) following catheter repalcement. He has responded well to antibiotics as he has been afebrile since 7/16. He is also much less lethargic and more engaging on exam. He may continue his IV antibiotics at outpatient HD.   #ESRD on HD:[name] is on a Tu/Th/Sa HD schedule. He has had access issues for some time given a SVC clot and the loss of his L femoral access site during his previous hospitalization when the artery was nicked during a thrombolysis procedure. There is still  grave concern that his HD cath may become nonfunctional at any point which will terminate pt ability for continued HD per nephrology. He had tPA left overnight on 7/16-7/17 in his catheter to successfully break a clot that halted HD. A family meeting with palliative care was called to reevaluate goals of care. He then had the catheter changed due to the bacteremia on 7/21. When he went for HD late afternoon on 7/21, the tubing had bubbles and HD was deferred. He had the tubing replaced on 7/23 by interventional radiology under MAC anesthesia and tolerated HD well that afternoon. He continued his cinacalcet, dialyvite, and renvela while hospitalized. He was under cardiac monitoring as his HD schedule became irregular with the noted issues above.   #Acquired Adrenal Insufficiency: [name] has documented adrenal insufficiency and is on Midodrine 15 mg tid at home. He was last discharged on a 10 day prednisone taper which was not completed. When he was admitted this time, he was given Solu-Cortef 100 IV twice. His steroids were weaned down during his stay from hydrocortisone 50 mg iv q6h to hydrocortisone 50 mg po q6h, then BID, then daily, then 10 mg prednisone then discontinued the day of discharge. His BP tolerated this well as he was generally in the 70s-100s/40s-70s.  #Chronic Hypotension: Patient is chronically hypotensive with unknown etiology but likely due to acquired adrenal insufficiency v acute shock. He continued his home midodrine 15 mg TID and the steroid taper described above. His BP was generally in the 70s-100s/40s-70s.   #History of seizures: [name] has had recent seizures. His dilantin level was undetectable in the ED. He continued his prescribed phenytoin 200 mg daily while here.   #Anemia: [name] hemoglobin ranged from 7.7-9.2 during his hospitalization. His most recent iron panel on 7/715 showed Iron 16, TIBC 189, Saturation 8, ferritin 176. Iron level low end of normal and TIBC  is low but ferritin normal which can be due to anemia of chronic disease related to his kidneys. This was monitored during his stay. There was a question of possible blood in a stool  the day of discharge. A CBC was ordered but was not completed as the patient would not let the phlebotomist draw the lab. This should be checked during the next HD session  #Mental retardation: [name] was altered when he presented in the setting of sepsis but has since been back at baseline from chart review and per family. He is non-verbal, but grunts and motions. The nursing staff had some difficulty with his feeds as he sometimes appeared angry and was non-cooperative.   Discharge Vitals:   BP 96/53  Pulse 90  Temp(Src) 98.1 F (36.7 C) (Oral)  Resp 16  Ht _0  (1.626 m)  Wt 126 lb 15.8 oz (57.601 kg)  BMI 21.79 kg/m2  SpO2 96%  Discharge Labs:  Results for orders placed during the hospital encounter of 05/02/14 (from the past 24 hour(s))  GLUCOSE, CAPILLARY     Status: None   Collection Time    05/11/14  2:51 PM      Result Value Ref Range   Glucose-Capillary 78  70 - 99 mg/dL  FERRITIN     Status: None   Collection Time    05/11/14  7:55 PM      Result Value Ref Range   Ferritin 309  22 - 322 ng/mL  IRON AND TIBC     Status: Abnormal   Collection Time    05/11/14  7:55 PM      Result Value Ref Range   Iron 23 (*) 42 - 135 ug/dL   TIBC 171 (*) 215 - 435 ug/dL   Saturation Ratios 13 (*) 20 - 55 %   UIBC 148  125 - 400 ug/dL  GLUCOSE, CAPILLARY     Status: None   Collection Time    05/11/14  9:33 PM      Result Value Ref Range   Glucose-Capillary 78  70 - 99 mg/dL  GLUCOSE, CAPILLARY     Status: None   Collection Time    05/12/14 12:13 AM      Result Value Ref Range   Glucose-Capillary 95  70 - 99 mg/dL  GLUCOSE, CAPILLARY     Status: None   Collection Time    05/12/14  4:13 AM      Result Value Ref Range   Glucose-Capillary 95  70 - 99 mg/dL  GLUCOSE, CAPILLARY     Status: None    Collection Time    05/12/14  7:35 AM      Result Value Ref Range   Glucose-Capillary 79  70 - 99 mg/dL  GLUCOSE, CAPILLARY     Status: None   Collection Time    05/12/14 11:19 AM      Result Value Ref Range   Glucose-Capillary 76  70 - 99 mg/dL   CBC    Component Value Date/Time   WBC 13.0* 05/06/2014 0250   RBC 2.91* 05/06/2014 0250   RBC 2.30* 04/17/2014 0800   HGB 9.2* 05/09/2014 0750   HCT 27.0* 05/09/2014 0750   PLT 113* 05/06/2014 0250   MCV 89.3 05/06/2014 0250   MCH 29.2 05/06/2014 0250   MCHC 32.7 05/06/2014 0250   RDW 25.8* 05/06/2014 0250   LYMPHSABS 0.2* 05/03/2014 0230   MONOABS 0.1 05/03/2014 0230   EOSABS 0 05/03/2014 0230   BASOSABS 0 05/03/2014 0230    BMET    Component Value Date/Time   NA 137 05/09/2014 0750   K 4.5 05/09/2014 0750   CL 98 05/06/2014 0250   CO2  25 05/06/2014 0250   GLUCOSE 140* 05/06/2014 0250   BUN 23 05/06/2014 0250   CREATININE 4.14* 05/06/2014 0250   CALCIUM 7.8* 05/06/2014 0250   GFRNONAA 14* 05/06/2014 0250   GFRAA 16* 05/06/2014 0250   7/19 procalcitonin >175 Troponin-i x 3 on 7/15: 0.88>0.79>1.1 7/15 lactate 4.1 7/15 ABG pH 7.5, pCO2 33, pO2 437, bicarb 26 7/14 ammonia 40 7/14 phenytoin <2.5    Recent Results (from the past 240 hour(s))  CULTURE, BLOOD (ROUTINE X 2)     Status: None   Collection Time    05/02/14  4:39 PM      Result Value Ref Range Status   Specimen Description BLOOD NECK   Final   Special Requests BOTTLES DRAWN AEROBIC AND ANAEROBIC 5 CC   Final   Culture  Setup Time     Final   Value: 05/02/2014 22:38     Performed at [facility]   Culture     Final   Value: PSEUDOMONAS AERUGINOSA     Note: SUSCEPTIBILITIES PERFORMED ON PREVIOUS CULTURE WITHIN THE LAST 5 DAYS.     Note: Gram Stain Report Called to,Read Back By and Verified With: Rico Sheehan RN [phone]     Performed at [facility]   Report Status 05/06/2014 FINAL   Final  CULTURE, BLOOD (ROUTINE X 2)     Status: None   Collection Time     05/02/14 11:25 PM      Result Value Ref Range Status   Specimen Description BLOOD RIGHT ARM   Final   Special Requests BOTTLES DRAWN AEROBIC ONLY 5CC   Final   Culture  Setup Time     Final   Value: 05/03/2014 08:46     Performed at [facility]   Culture     Final   Value: PSEUDOMONAS AERUGINOSA     1245 Note: Gram Stain Report Called to,Read Back By and Verified With: [facility] 05/04/14 [place]     Performed at [facility]   Report Status 05/06/2014 FINAL   Final   Organism ID, Bacteria PSEUDOMONAS AERUGINOSA   Final  CULTURE, BLOOD (SINGLE)     Status: None   Collection Time    05/03/14  8:52 AM      Result Value Ref Range Status   Specimen Description BLOOD RIGHT ANTECUBITAL   Final   Special Requests BOTTLES DRAWN AEROBIC ONLY 2CC   Final   Culture  Setup Time     Final   Value: 05/03/2014 13:23     Performed at [facility]   Culture     Final   Value: PSEUDOMONAS AERUGINOSA     Note: SUSCEPTIBILITIES PERFORMED ON PREVIOUS CULTURE WITHIN THE LAST 5 DAYS.     Note: Gram Stain Report Called to,Read Back By and Verified With: STEPHANIE SHAW 05/04/14 1000 BY SMITHERSJ     Performed at [facility]   Report Status 05/06/2014 FINAL   Final  WOUND CULTURE     Status: None   Collection Time    05/04/14 10:48 AM      Result Value Ref Range Status   Specimen Description WOUND RIGHT GROIN   Final   Special Requests NONE   Final   Gram Stain     Final   Value: RARE WBC PRESENT,BOTH PMN AND MONONUCLEAR     NO SQUAMOUS EPITHELIAL CELLS SEEN     NO ORGANISMS SEEN     Performed at [facility]   Culture     Final   Value: FEW PSEUDOMONAS AERUGINOSA     Performed at [facility]   Report Status 05/07/2014 FINAL   Final   Organism ID, Bacteria PSEUDOMONAS AERUGINOSA   Final  CULTURE, BLOOD (SINGLE)     Status: None   Collection Time    05/05/14  6:50 PM      Result Value Ref Range Status   Specimen Description BLOOD RIGHT HAND    Final   Special Requests BOTTLES DRAWN AEROBIC ONLY 3CC   Final   Culture  Setup Time     Final   Value: 05/05/2014 21:57     Performed at [facility]   Culture     Final   Value: NO GROWTH 5 DAYS     Performed at [facility]   Report Status 05/11/2014 FINAL   Final  CATH TIP CULTURE     Status: None   Collection Time    05/07/14  9:01 AM      Result Value Ref Range Status   Specimen Description CATH TIP RIGHT FEMORAL DIATEK CATH TIP   Final   Special Requests Normal   Final   Culture     Final   Value: 2 COLONIES PSEUDOMONAS AERUGINOSA     Performed at [facility]   Report Status 05/11/2014 FINAL   Final  CULTURE, BLOOD (SINGLE)     Status: None   Collection Time    05/09/14  6:30 PM      Result Value Ref Range Status   Specimen Description BLOOD HEMODIALYSIS CATHETER   Final   Special Requests BOTTLES DRAWN AEROBIC AND ANAEROBIC 10CC   Final   Culture  Setup Time     Final   Value: 05/10/2014 00:48     Performed at [facility]   Culture     Final   Value:        BLOOD CULTURE RECEIVED NO GROWTH TO DATE CULTURE WILL BE HELD FOR 5 DAYS BEFORE ISSUING A FINAL NEGATIVE REPORT     Performed at [facility]   Report Status PENDING   Incomplete     Discharge Physical Exam: General Appearance: Laying in bed sleeping, not agitated, is engaging once woken up  HEENT: moist mucous membranes  Lungs: Very difficult to appreciate lung sounds as he was noncooperative, respirations unlabored.  Heart: Regular rate and rhythm, S1 and S2 normal, 2/6 systolic ejection murmur loudest at apex  Abdomen: Soft, non-tender, bowel sounds active all four quadrants, no masses, no organomegaly  Extremities: R groin with bandage c/d/i from HD catheter  Pulses: 2+ and symmetric all extremities    Signed: Kelby Aline, MD 05/12/2014, 1:47 PM    Services Ordered on Discharge: continue antibiotic regimen as detailed above. Equipment Ordered on Discharge:  none

## 2014-05-11 NOTE — Anesthesia Postprocedure Evaluation (Signed)
Anesthesia Post Note  Patient: [name]  Procedure(s) Performed: Procedure(s) (LRB): RADIOLOGY WITH ANESTHESIA (N/A)  Anesthesia type: MAC  Patient location: PACU  Post pain: Pain level controlled and Adequate analgesia  Post assessment: Post-op Vital signs reviewed, Patient's Cardiovascular Status Stable and Respiratory Function Stable  Last Vitals:  Filed Vitals:   05/11/14 1436  BP: 108/66  Pulse: 89  Temp:   Resp: 14    Post vital signs: Reviewed and stable  Level of consciousness: awake, alert  and oriented  Complications: No apparent anesthesia complications

## 2014-05-11 NOTE — Progress Notes (Signed)
PT Cancellation Note  Patient Details Name: Rowe Clackeal W Eberly MRN: [id number] DOB: 1951/09/10   Cancelled Treatment:    Reason Eval Not Completed: Patient declined, no reason specified. Pt begins grabbing his covers to cover himself back up when encouraged to get OOB. He did point and reach for his SCD hose and wanted these donned. He cooperated to lift each leg to apply these. Reached down to pull up bedrail (clearly indicating he was not getting OOB).   Will attempt evaluation on one more occasion and if unable to get pt to participate, will sign-off.   Voncille Simm 05/11/2014, 4:05 PM Pager [phone]

## 2014-05-11 NOTE — Progress Notes (Signed)
SLP Cancellation Note  Patient Details Name: [name] MRN: [id number] DOB: [birth year]   Cancelled treatment:        NPO for procedure.  Will continue efforts   Royce MacadamiaLitaker, Albie Arizpe Willis 05/11/2014, 2:43 PM

## 2014-05-11 NOTE — Procedures (Signed)
Successful exchange of the right groin tunneled dialysis catheter with fluoroscopy.  50 cm Split cath was placed.  Tip in right atrium.  Both lumens aspirate and flush well.  No immediate complication.  Catheter is ready to use.

## 2014-05-11 NOTE — Progress Notes (Signed)
[facility] Progress Note   Subjective: Events noted again- not able to exchange PC until this AM  Filed Vitals:   05/10/14 1200 05/10/14 1601 05/10/14 2033 05/11/14 0429  BP: 114/53 100/49 103/70 105/64  Pulse: 89 94 93 90  Temp:  97.4 F (36.3 C) 97.5 F (36.4 C) 97.6 F (36.4 C)  TempSrc:  Oral Oral Oral  Resp:  18 18 18   Height:      Weight:   56.6 kg (124 lb 12.5 oz)   SpO2: 96%  96% 96%   Exam: Awake and alert, mute No jvd Chest clear bilat RRR no MRG ABd soft, NTND, no ascites No LE or UE edema Neuro is nf, Ox 3 R femoral tunneled HD cath- new   Dialysis: TTS [facility]  3hr 56kgs 160 400/1.5 2k/2ca Heparin 3500 R fem cath  hectorol 11 mcg IV/HD Arenesp 100 q week Venofer  Assessment:  1 Pseudomonas bacteremia due to infected R groin HD cath- cath removed 7/18  replaced 7/21  on IV abx; on IV Fortaz- can continue fortaz with OP HD- subsequent cultures have been negative 2 Perm access- 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 - plans for care clearly delineated by palliative care    Aerielle Stoklosa A    05/11/2014, 9:58 AM     Recent Labs Lab 05/05/14 0500 05/06/14 0250 05/09/14 0750  NA 142 140 137  K 6.1* 4.8 4.5  CL  101 98  --   CO2 21 25  --   GLUCOSE 141* 140*  --   BUN 60* 23  --   CREATININE 8.89* 4.14*  --   CALCIUM 7.3* 7.8*  --   PHOS  --  4.0  --    No results found for this basename: AST, ALT, ALKPHOS, BILITOT, PROT, ALBUMIN,  in the last 168 hours  Recent Labs Lab 05/05/14 0500 05/06/14 0250 05/09/14 0750  WBC 15.4* 13.0*  --   HGB 8.4* 8.5* 9.2*  HCT 26.6* 26.0* 27.0*  MCV 95.0 89.3  --   PLT 96* 113*  --    . cefTAZidime (FORTAZ)  IV  2 g Intravenous Q T,Th,Sa-HD  . cinacalcet  30 mg Oral BID WC  . darbepoetin (ARANESP) injection - DIALYSIS  100 mcg Intravenous Q Thu-HD  . doxercalciferol  10 mcg Intravenous Q T,Th,Sa-HD  . fentaNYL  12.5 mcg Intravenous Once  . midodrine  15 mg Oral TID  . multivitamin  1 tablet Oral QHS  . pantoprazole  40 mg Oral Daily  . phenytoin  200 mg Oral QHS  . predniSONE  10 mg Oral Q breakfast  . sevelamer carbonate  800 mg Oral TID WC  .  sodium chloride  3 mL Intravenous Q12H     acetaminophen, acetaminophen, heparin

## 2014-05-11 NOTE — Transfer of Care (Signed)
Immediate Anesthesia Transfer of Care Note  Patient: [name]  Procedure(s) Performed: Procedure(s): RADIOLOGY WITH ANESTHESIA (N/A)  Patient Location: PACU  Anesthesia Type:MAC  Level of Consciousness: awake and alert   Airway & Oxygen Therapy: Patient Spontanous Breathing and Patient connected to nasal cannula oxygen  Post-op Assessment: Report given to PACU RN  Post vital signs: Reviewed and stable  Complications: No apparent anesthesia complications

## 2014-05-11 NOTE — Progress Notes (Signed)
ANTIBIOTIC CONSULT NOTE - Follow-up  Pharmacy Consult for vancomycin Indication: bacteremia  No Known Allergies  Patient Measurements: Height: 5\' 4"  (162.6 cm) Weight: 124 lb 12.5 oz (56.6 kg) IBW/kg (Calculated) : 59.2  Vital Signs: Temp: 98.5 F (36.9 C) (07/23 1400) Temp src: Oral (07/23 0900) BP: 108/66 mmHg (07/23 1436) Pulse Rate: 89 (07/23 1436) Intake/Output from previous day: 07/22 0701 - 07/23 0700 In: 0  Out: 1 [Stool:1] Intake/Output from this shift: Total I/O In: 200 [I.V.:200] Out: -   Labs:  Recent Labs  05/09/14 0750  HGB 9.2*   Estimated Creatinine Clearance: 14.2 ml/min (by C-G formula based on Cr of 4.14). No results found for this basename: VANCOTROUGH, Leodis BinetVANCOPEAK, VANCORANDOM, GENTTROUGH, GENTPEAK, GENTRANDOM, TOBRATROUGH, TOBRAPEAK, TOBRARND, AMIKACINPEAK, AMIKACINTROU, AMIKACIN,  in the last 72 hours   Microbiology: Recent Results (from the past 720 hour(s))  CULTURE, BLOOD (ROUTINE X 2)     Status: None   Collection Time    04/24/14  6:30 PM      Result Value Ref Range Status   Specimen Description BLOOD LEFT CENTRAL LINE   Final   Special Requests BOTTLES DRAWN AEROBIC AND ANAEROBIC 10CC   Final   Culture  Setup Time     Final   Value: 04/25/2014 00:22     Performed at [facility]   Culture     Final   Value: NO GROWTH 5 DAYS     Performed at [facility]   Report Status 05/01/2014 FINAL   Final  CULTURE, BLOOD (ROUTINE X 2)     Status: None   Collection Time    05/02/14  4:39 PM      Result Value Ref Range Status   Specimen Description BLOOD NECK   Final   Special Requests BOTTLES DRAWN AEROBIC AND ANAEROBIC 5 CC   Final   Culture  Setup Time     Final   Value: 05/02/2014 22:38     Performed at [facility]   Culture     Final   Value: PSEUDOMONAS AERUGINOSA     Note: SUSCEPTIBILITIES PERFORMED ON PREVIOUS CULTURE WITHIN THE LAST 5 DAYS.     Note: Gram Stain Report Called to,Read Back By and Verified  With: Lovie MacadamiaNADINE WELLINGTON RN [phone]     Performed at [facility]   Report Status 05/06/2014 FINAL   Final  CULTURE, BLOOD (ROUTINE X 2)     Status: None   Collection Time    05/02/14 11:25 PM      Result Value Ref Range Status   Specimen Description BLOOD RIGHT ARM   Final   Special Requests BOTTLES DRAWN AEROBIC ONLY 5CC   Final   Culture  Setup Time     Final   Value: 05/03/2014 08:46     Performed at [facility]   Culture     Final   Value: PSEUDOMONAS AERUGINOSA     1245 Note: Gram Stain Report Called to,Read Back By and Verified With: [facility]CM[facility] 05/04/14 FULKC     Performed at [facility]   Report Status 05/06/2014 FINAL   Final   Organism ID, Bacteria PSEUDOMONAS AERUGINOSA   Final  CULTURE, BLOOD (SINGLE)     Status: None   Collection Time    05/03/14  8:52 AM      Result Value Ref Range Status   Specimen Description BLOOD RIGHT ANTECUBITAL   Final   Special Requests BOTTLES DRAWN AEROBIC ONLY 2CC  Final   Culture  Setup Time     Final   Value: 05/03/2014 13:23     Performed at [facility]   Culture     Final   Value: PSEUDOMONAS AERUGINOSA     Note: SUSCEPTIBILITIES PERFORMED ON PREVIOUS CULTURE WITHIN THE LAST 5 DAYS.     Note: Gram Stain Report Called to,Read Back By and Verified With: STEPHANIE SHAW 05/04/14 1000 BY SMITHERSJ     Performed at [facility]   Report Status 05/06/2014 FINAL   Final  WOUND CULTURE     Status: None   Collection Time    05/04/14 10:48 AM      Result Value Ref Range Status   Specimen Description WOUND RIGHT GROIN   Final   Special Requests NONE   Final   Gram Stain     Final   Value: RARE WBC PRESENT,BOTH PMN AND MONONUCLEAR     NO SQUAMOUS EPITHELIAL CELLS SEEN     NO ORGANISMS SEEN     Performed at [facility]   Culture     Final   Value: FEW PSEUDOMONAS AERUGINOSA     Performed at [facility]   Report Status 05/07/2014 FINAL   Final   Organism ID, Bacteria  PSEUDOMONAS AERUGINOSA   Final  CULTURE, BLOOD (SINGLE)     Status: None   Collection Time    05/05/14  6:50 PM      Result Value Ref Range Status   Specimen Description BLOOD RIGHT HAND   Final   Special Requests BOTTLES DRAWN AEROBIC ONLY 3CC   Final   Culture  Setup Time     Final   Value: 05/05/2014 21:57     Performed at [facility]   Culture     Final   Value: NO GROWTH 5 DAYS     Performed at [facility]   Report Status 05/11/2014 FINAL   Final  CATH TIP CULTURE     Status: None   Collection Time    05/07/14  9:01 AM      Result Value Ref Range Status   Specimen Description CATH TIP RIGHT FEMORAL DIATEK CATH TIP   Final   Special Requests Normal   Final   Culture     Final   Value: 2 COLONIES PSEUDOMONAS AERUGINOSA     Performed at [facility]   Report Status 05/11/2014 FINAL   Final  CULTURE, BLOOD (SINGLE)     Status: None   Collection Time    05/09/14  6:30 PM      Result Value Ref Range Status   Specimen Description BLOOD HEMODIALYSIS CATHETER   Final   Special Requests BOTTLES DRAWN AEROBIC AND ANAEROBIC 10CC   Final   Culture  Setup Time     Final   Value: 05/10/2014 00:48     Performed at [facility]   Culture     Final   Value:        BLOOD CULTURE RECEIVED NO GROWTH TO DATE CULTURE WILL BE HELD FOR 5 DAYS BEFORE ISSUING A FINAL NEGATIVE REPORT     Performed at [facility]   Report Status PENDING   Incomplete   Medications:  Scheduled:  . cefTAZidime (FORTAZ)  IV  2 g Intravenous Q T,Th,Sa-HD  . cinacalcet  30 mg Oral BID WC  . darbepoetin (ARANESP) injection - DIALYSIS  100 mcg Intravenous Q Thu-HD  . dextrose  12.5 g Intravenous Once  . dextrose      . doxercalciferol  10 mcg Intravenous Q T,Th,Sa-HD  . fentaNYL  12.5 mcg Intravenous Once  . heparin      . midazolam      . midodrine  15 mg Oral TID  . multivitamin  1 tablet Oral QHS  . pantoprazole  40 mg Oral Daily  . phenytoin  200 mg Oral QHS  .  predniSONE  10 mg Oral Q breakfast  . sevelamer carbonate  800 mg Oral TID WC  . sodium chloride  3 mL Intravenous Q12H  . vancomycin  1,250 mg Intravenous Once   Assessment: 63 yom initially admitted with AMS and respiratory depression. Started on ceftazidime for speudomonas bacteremia and vancomycin was later added. However, pt had access issues so first dose of vancomycin was never able to be administered. To re-attempt initiation of vancomycin today. Pt is s/p exchange of R groin tunneled dialysis catheter in radiology. To receive HD later today. Pt is afebrile and last WBC was 13 on 7/18.   Vanc 7/14 x 1; 7/23>> Ceftaz 7/18>> Levaquin x 1 7/18 Zosyn 7/16>>7/18  7/14 Blood >> PSA 7/17 Blood - NEG 7/16 Wound - few PSA 7/19 Cath tip - PSA pending  Goal of Therapy:  pre-HD vanc 15-25  Plan:  1. Vancomycin 1250mg  IV x 1 now 2. F/u HD toleration for maintenance doses 3. Continue ceftaz per MD - should receive a dose tonight after HD 4. F/u renal plans, C&S, clinical status and pre-HD vanc level at [facility], PharmD, BCPS Pager # [phone] 05/11/2014 2:56 PM

## 2014-05-11 NOTE — Progress Notes (Signed)
PT Cancellation Note  Patient Details Name: Rowe Clackeal W Witherow MRN: [id number] DOB: [birth year]   Cancelled Treatment:    Reason Eval/Treat Not Completed: Patient at procedure or test/unavailable. Pt having HD catheter exchanged. Will attempt later today.    Anibal Quinby 05/11/2014, 10:55 AM Pager [phone]

## 2014-05-12 ENCOUNTER — Encounter (HOSPITAL_COMMUNITY): Payer: Self-pay | Admitting: Interventional Radiology

## 2014-05-12 LAB — GLUCOSE, CAPILLARY
GLUCOSE-CAPILLARY: 76 mg/dL (ref 70–99)
GLUCOSE-CAPILLARY: 133 mg/dL — AB (ref 70–99)
Glucose-Capillary: 79 mg/dL (ref 70–99)
Glucose-Capillary: 95 mg/dL (ref 70–99)
Glucose-Capillary: 95 mg/dL (ref 70–99)

## 2014-05-12 LAB — FERRITIN: Ferritin: 309 ng/mL (ref 22–322)

## 2014-05-12 LAB — IRON AND TIBC
Iron: 23 ug/dL — ABNORMAL LOW (ref 42–135)
SATURATION RATIOS: 13 % — AB (ref 20–55)
TIBC: 171 ug/dL — ABNORMAL LOW (ref 215–435)
UIBC: 148 ug/dL (ref 125–400)

## 2014-05-12 MED ORDER — VANCOMYCIN HCL 500 MG IV SOLR: 500.0000 mg | Freq: Once | INTRAVENOUS | Status: DC

## 2014-05-12 MED ORDER — DEXTROSE 5 % IV SOLN: 2.0000 g | INTRAVENOUS | Status: DC

## 2014-05-12 NOTE — Care Management Note (Signed)
CARE MANAGEMENT NOTE 05/12/2014  Patient:  [name],[name]   Account Number:  [id number]  Date Initiated:  05/05/2014  Documentation initiated by:  [name]  Subjective/Objective Assessment:   dx SIRS; resident of family care home    PCP  [name]     Action/Plan:   05/12/14 Plan for pt to return to group home, CSW working with this pt.   Anticipated DC Date:  05/12/2014   Anticipated DC Plan:  GROUP HOME  In-house referral  Clinical Social Worker      DC Planning Services  CM consult      Choice offered to / List presented to:             Status of service:  In process, will continue to follow Medicare Important Message given?  YES (If response is "NO", the following Medicare IM given date fields will be blank) Date Medicare IM given:  05/05/2014 Medicare IM given by:  [name] Date Additional Medicare IM given:  05/12/2014 Additional Medicare IM given by:  [name]  Discharge Disposition:  GROUP HOME  Per UR Regulation:  Reviewed for med. necessity/level of care/duration of stay  If discussed at Long Length of Stay Meetings, dates discussed:    Comments:  05/05/14 1554 [name] Mayo RN MSN BSN CCM Attempting HD after TPA to cath overnight.

## 2014-05-12 NOTE — Clinical Social Work Note (Addendum)
[facility] to [facility] with caregiver Dolly RiasDecatur [name] [phone]). Mr. Toni ArthursFuller given  discharge information and will transport patient home.  Genelle BalVanessa Jarel Cuadra, MSW, LCSW [phone]

## 2014-05-12 NOTE — Progress Notes (Signed)
Subjective: [name]. [name] had the tubing on his R groin HD catheter successfully replaced yesterday and tolerated PM HD well. This morning he was sleeping in bed but easily arousable and cooperative with exam.   Objective: Vital signs in last 24 hours: Filed Vitals:   05/11/14 2101 05/11/14 2137 05/12/14 0405 05/12/14 0924  BP: 96/57 102/65 103/59 96/53  Pulse: 108 100 91 90  Temp: 98 F (36.7 C) 97.5 F (36.4 C) 98.1 F (36.7 C) 98.1 F (36.7 C)  TempSrc: Oral Oral Oral Oral  Resp: 14 14 14 16   Height:      Weight: 126 lb 15.8 oz (57.6 kg) 126 lb 15.8 oz (57.601 kg)    SpO2: 100% 100% 100% 96%   Weight change: 3 lb 8.4 oz (1.599 kg)  Intake/Output Summary (Last 24 hours) at 05/12/14 1112 Last data filed at 05/12/14 0855  Gross per 24 hour  Intake    510 ml  Output   1100 ml  Net   -590 ml   BP 96/53  Pulse 90  Temp(Src) 98.1 F (36.7 C) (Oral)  Resp 16  Ht 5' 4"  (1.626 m)  Wt 126 lb 15.8 oz (57.601 kg)  BMI 21.79 kg/m2  SpO2 96%  General Appearance:    Laying in bed sleeping, not agitated, is engaging once woken up  HEENT:     moist mucous membranes  Lungs:     Very difficult to appreciate lung sounds as he was noncooperative, respirations unlabored.  Heart:    Regular rate and rhythm, S1 and S2 normal, 2/6 systolic ejection murmur loudest at apex  Abdomen:     Soft, non-tender, bowel sounds active all four quadrants,    no masses, no organomegaly  Extremities:   R groin with bandage c/d/i from HD catheter  Pulses:   2+ and symmetric all extremities   Lab Results: Basic Metabolic Panel:  Recent Labs Lab 05/06/14 0250 05/09/14 0750  NA 140 137  K 4.8 4.5  CL 98  --   CO2 25  --   GLUCOSE 140*  --   BUN 23  --   CREATININE 4.14*  --   CALCIUM 7.8*  --   PHOS 4.0  --    Liver Function Tests: No results found for this basename: AST, ALT, ALKPHOS, BILITOT, PROT, ALBUMIN,  in the last 168 hours No results found for this basename: AMMONIA,  in the last  168 hours CBC:  Recent Labs Lab 05/06/14 0250 05/09/14 0750  WBC 13.0*  --   HGB 8.5* 9.2*  HCT 26.0* 27.0*  MCV 89.3  --   PLT 113*  --    Cardiac Enzymes: No results found for this basename: CKTOTAL, CKMB, CKMBINDEX, TROPONINI,  in the last 168 hours CBG:  Recent Labs Lab 05/11/14 1316 05/11/14 1451 05/11/14 2133 05/12/14 0013 05/12/14 0413 05/12/14 0735  GLUCAP 106* 78 78 95 95 79   Coagulation: No results found for this basename: LABPROT, INR,  in the last 168 hours Micro Results: Recent Results (from the past 240 hour(s))  CULTURE, BLOOD (ROUTINE X 2)     Status: None   Collection Time    05/02/14  4:39 PM      Result Value Ref Range Status   Specimen Description BLOOD NECK   Final   Special Requests BOTTLES DRAWN AEROBIC AND ANAEROBIC 5 CC   Final   Culture  Setup Time     Final   Value: 05/02/2014 22:38  Performed at [facility]     Final   Value: PSEUDOMONAS AERUGINOSA     Note: SUSCEPTIBILITIES PERFORMED ON PREVIOUS CULTURE WITHIN THE LAST 5 DAYS.     Note: Gram Stain Report Called to,Read Back By and Verified With: Rico Sheehan RN [phone]     Performed at [facility]   Report Status 05/06/2014 FINAL   Final  CULTURE, BLOOD (ROUTINE X 2)     Status: None   Collection Time    05/02/14 11:25 PM      Result Value Ref Range Status   Specimen Description BLOOD RIGHT ARM   Final   Special Requests BOTTLES DRAWN AEROBIC ONLY 5CC   Final   Culture  Setup Time     Final   Value: 05/03/2014 08:46     Performed at [facility]   Culture     Final   Value: PSEUDOMONAS AERUGINOSA     1245 Note: Gram Stain Report Called to,Read Back By and Verified With: [facility] 2 05/04/14 [place]     Performed at [facility]   Report Status 05/06/2014 FINAL   Final   Organism ID, Bacteria PSEUDOMONAS AERUGINOSA   Final  CULTURE, BLOOD (SINGLE)     Status: None   Collection Time    05/03/14  8:52 AM      Result Value Ref  Range Status   Specimen Description BLOOD RIGHT ANTECUBITAL   Final   Special Requests BOTTLES DRAWN AEROBIC ONLY 2CC   Final   Culture  Setup Time     Final   Value: 05/03/2014 13:23     Performed at [facility]   Culture     Final   Value: PSEUDOMONAS AERUGINOSA     Note: SUSCEPTIBILITIES PERFORMED ON PREVIOUS CULTURE WITHIN THE LAST 5 DAYS.     Note: Gram Stain Report Called to,Read Back By and Verified With: STEPHANIE SHAW 05/04/14 1000 BY SMITHERSJ     Performed at [facility]   Report Status 05/06/2014 FINAL   Final  WOUND CULTURE     Status: None   Collection Time    05/04/14 10:48 AM      Result Value Ref Range Status   Specimen Description WOUND RIGHT GROIN   Final   Special Requests NONE   Final   Gram Stain     Final   Value: RARE WBC PRESENT,BOTH PMN AND MONONUCLEAR     NO SQUAMOUS EPITHELIAL CELLS SEEN     NO ORGANISMS SEEN     Performed at [facility]   Culture     Final   Value: FEW PSEUDOMONAS AERUGINOSA     Performed at [facility]   Report Status 05/07/2014 FINAL   Final   Organism ID, Bacteria PSEUDOMONAS AERUGINOSA   Final  CULTURE, BLOOD (SINGLE)     Status: None   Collection Time    05/05/14  6:50 PM      Result Value Ref Range Status   Specimen Description BLOOD RIGHT HAND   Final   Special Requests BOTTLES DRAWN AEROBIC ONLY 3CC   Final   Culture  Setup Time     Final   Value: 05/05/2014 21:57     Performed at [facility]   Culture     Final   Value: NO GROWTH 5 DAYS     Performed at [facility]   Report Status 05/11/2014 FINAL   Final  CATH TIP CULTURE     Status: None   Collection Time    05/07/14  9:01 AM      Result Value Ref Range Status   Specimen Description CATH TIP RIGHT FEMORAL DIATEK CATH TIP   Final   Special Requests Normal   Final   Culture     Final   Value: 2 COLONIES PSEUDOMONAS AERUGINOSA     Performed at [facility]   Report Status 05/11/2014 FINAL   Final    CULTURE, BLOOD (SINGLE)     Status: None   Collection Time    05/09/14  6:30 PM      Result Value Ref Range Status   Specimen Description BLOOD HEMODIALYSIS CATHETER   Final   Special Requests BOTTLES DRAWN AEROBIC AND ANAEROBIC 10CC   Final   Culture  Setup Time     Final   Value: 05/10/2014 00:48     Performed at [facility]   Culture     Final   Value:        BLOOD CULTURE RECEIVED NO GROWTH TO DATE CULTURE WILL BE HELD FOR 5 DAYS BEFORE ISSUING A FINAL NEGATIVE REPORT     Performed at [facility]   Report Status PENDING   Incomplete   Studies/Results:  Medications: I have reviewed the patient's current medications. Scheduled Meds: . cefTAZidime (FORTAZ)  IV  2 g Intravenous Q T,Th,Sa-HD  . cinacalcet  30 mg Oral BID WC  . darbepoetin (ARANESP) injection - DIALYSIS  100 mcg Intravenous Q Thu-HD  . dextrose  12.5 g Intravenous Once  . doxercalciferol  10 mcg Intravenous Q T,Th,Sa-HD  . fentaNYL  12.5 mcg Intravenous Once  . midodrine  15 mg Oral TID  . multivitamin  1 tablet Oral QHS  . pantoprazole  40 mg Oral Daily  . phenytoin  200 mg Oral QHS  . sevelamer carbonate  800 mg Oral TID WC  . sodium chloride  3 mL Intravenous Q12H   Continuous Infusions:  PRN Meds:.acetaminophen, acetaminophen Assessment/Plan:  Sepsis 2/2 Pseudomonas bacteremia: Pt originally met all SIRS criteria.This infection may be due to his recently displaced R groin HD catheter although the site does not look infected. PCCM was consulted when pt continued to be hypotensive and given access issues will not be able to provide pressor support despite HCPOA wishes to continue with pressors this continues to be an issue especially since the patient lost all PIV access at this time as well. Dr. Kellie Simmering of vascular surgery evaluated the patient and felt the catheter should only be removed as last resort. He had his R femoral HD cath tubing replaced yesterday. Patient currently on ceftazidime 2 g  w HD T/Th/Sa for pseudomonas. He is Dr Moshe Cipro saw him and says he can receive iv fortaz with outpatient HD.  BCx that was drawn once the catheter was removed w NGTD. Afebrile overnight. -appreciate vascular surgery, critical care, palliative care input  -zosyn 2.25 g q8h>> was transitioned to oral levaquin q48h>>IV ceftazidime 2 g with HD x 14 days per ID -repeat BCx final results -midodrine 15 mg TID  ESRD on XB:WIOMB is still grave concern that HD cath may become nonfunctional at any point which will terminate pt ability for continued HD per nephrology. HD with new tubing tolerated well yesterday.  -appreciate renal, vascular, IR -I/Os  -TTS HD -vascular surgery consulted and do not recommend and will not be pursing new graft access pt has truly reached  his end access -Continue Cinacalcet, Dialyvite, Renvela  -cardiac monitoring  Acquired Adrenal Insufficiency: Patient has documented adrenal insufficiency, on Midodrine 15 mg tid at home. Was discharged on 10 day prednisone taper which was not completed.   Hypotension likely contributed to by adrenal insufficiency. He has been slowly tapered off steroid. -d/c 10 mg prednisone po daily  Hypotension: Patient is chronically hypotensive with unknown etiology but likely due to acquired adrenal insufficiency v acute shock. His BP overnight/this morning was 70s-100s/40s-70s.On midodrine 15 mg TID. Steroids are being tapered. -Continue midodrine 15 mg TID  -d/c prednisone 10 mg po daily -palliative support if becomes symptomatic   History of seizures: No recent seizures. Unlikely that acute mental status change was a result of seizure since pt has respiratory distress and hypotension. Pt back at mental status baseline. On phenytoin 200 mg daily at home. Dilantin level was undetectable in ED.  -cont dilantin  -Seizure precautions  -Neuro checks   Mental retardation: Patient appears back at baseline from chart review and per family.  Non-verbal, but grunts. He maintained intermittent eye contact with me  Dispo: Disposition is deferred at this time, awaiting improvement of current medical problems.  Anticipated discharge in approximately 1-2 day(s).   The patient does have a current PCP Corky Sox, MD) and does need an [facility] hospital follow-up appointment after discharge.  The patient does have transportation limitations that hinder transportation to clinic appointments.  .Services Needed at time of discharge: Y = Yes, Blank = No PT:   OT:   RN:   Equipment:   Other:     LOS: 10 days   Kelby Aline, MD 05/12/2014, 11:12 AM

## 2014-05-12 NOTE — Progress Notes (Signed)
Speech Language Pathology Discharge Patient Details Name: [name] MRN: [id number] DOB: 02-Dec-1950 Today's Date: 05/12/2014 Time:  -     Patient discharged from SLP services secondary to patient has refused 3 (three) consecutive times without medical reason.  SLP has attempted to assess swallow function since Monday and have been unable due to pt. Continuous refusal and NPO for catheter placement this week and procedure yesterday.  This SLP met sister yesterday who reported he will "usually eat for them" and has not had difficulty.  No reported problems from nursing.  ST will sign off at this time.  Please see latest therapy progress note for current level of functioning and progress toward goals.    Progress and discharge plan discussed with patient and/or caregiver: Patient unable to participate in discharge planning and no caregivers available  GO     Houston Siren M.Ed [employer] [phone]  05/12/2014

## 2014-05-12 NOTE — Progress Notes (Signed)
PROGRESS NOTE MEDICINE TEACHING ATTENDING   Day 10 of stay Patient name: [name]   Medical record number: [id number] Date of birth: 17-Feb-1951    HD worked yesterday. Patient has no new complaints/signs/symptoms. If nephrology is okay with it, the patient can be discharged today. He will take IV antibiotics with dialysis - 2 weeks.    I have discussed the care of this patient with my IM team residents. Please see the resident note for details.  Kloee Ballew 05/12/2014, 2:24 PM.

## 2014-05-12 NOTE — Progress Notes (Signed)
ANTIBIOTIC CONSULT NOTE - Follow-up  Pharmacy Consult for vancomycin Indication: bacteremia  No Known Allergies  Patient Measurements: Height: 5\' 4"  (162.6 cm) Weight: 126 lb 15.8 oz (57.601 kg) IBW/kg (Calculated) : 59.2  Vital Signs: Temp: 98.1 F (36.7 C) (07/24 0924) Temp src: Oral (07/24 0924) BP: 96/53 mmHg (07/24 0924) Pulse Rate: 90 (07/24 0924) Intake/Output from previous day: 07/23 0701 - 07/24 0700 In: 510 [P.O.:60; I.V.:200; IV Piggyback:250] Out: 1100  Intake/Output from this shift:    Labs: No results found for this basename: WBC, HGB, PLT, LABCREA, CREATININE,  in the last 72 hours Estimated Creatinine Clearance: 14.5 ml/min (by C-G formula based on Cr of 4.14). No results found for this basename: VANCOTROUGH, Leodis Binet, VANCORANDOM, GENTTROUGH, GENTPEAK, GENTRANDOM, TOBRATROUGH, TOBRAPEAK, TOBRARND, AMIKACINPEAK, AMIKACINTROU, AMIKACIN,  in the last 72 hours   Microbiology: Recent Results (from the past 720 hour(s))  CULTURE, BLOOD (ROUTINE X 2)     Status: None   Collection Time    04/24/14  6:30 PM      Result Value Ref Range Status   Specimen Description BLOOD LEFT CENTRAL LINE   Final   Special Requests BOTTLES DRAWN AEROBIC AND ANAEROBIC 10CC   Final   Culture  Setup Time     Final   Value: 04/25/2014 00:22     Performed at [facility]   Culture     Final   Value: NO GROWTH 5 DAYS     Performed at [facility]   Report Status 05/01/2014 FINAL   Final  CULTURE, BLOOD (ROUTINE X 2)     Status: None   Collection Time    05/02/14  4:39 PM      Result Value Ref Range Status   Specimen Description BLOOD NECK   Final   Special Requests BOTTLES DRAWN AEROBIC AND ANAEROBIC 5 CC   Final   Culture  Setup Time     Final   Value: 05/02/2014 22:38     Performed at [facility]   Culture     Final   Value: PSEUDOMONAS AERUGINOSA     Note: SUSCEPTIBILITIES PERFORMED ON PREVIOUS CULTURE WITHIN THE LAST 5 DAYS.     Note: Gram Stain  Report Called to,Read Back By and Verified With: Lovie Macadamia RN [phone]     Performed at [facility]   Report Status 05/06/2014 FINAL   Final  CULTURE, BLOOD (ROUTINE X 2)     Status: None   Collection Time    05/02/14 11:25 PM      Result Value Ref Range Status   Specimen Description BLOOD RIGHT ARM   Final   Special Requests BOTTLES DRAWN AEROBIC ONLY 5CC   Final   Culture  Setup Time     Final   Value: 05/03/2014 08:46     Performed at [facility]   Culture     Final   Value: PSEUDOMONAS AERUGINOSA     1245 Note: Gram Stain Report Called to,Read Back By and Verified With: [facility] 05/04/14 FULKC     Performed at [facility]   Report Status 05/06/2014 FINAL   Final   Organism ID, Bacteria PSEUDOMONAS AERUGINOSA   Final  CULTURE, BLOOD (SINGLE)     Status: None   Collection Time    05/03/14  8:52 AM      Result Value Ref Range Status   Specimen Description BLOOD RIGHT ANTECUBITAL   Final   Special Requests BOTTLES DRAWN AEROBIC  ONLY [facility]   Final   Culture  Setup Time     Final   Value: 05/03/2014 13:23     [facility]   Culture     Final   Value: PSEUDOMONAS AERUGINOSA     Note: SUSCEPTIBILITIES PERFORMED ON PREVIOUS CULTURE WITHIN THE LAST 5 DAYS.     Note: Gram Stain Report Called to,Read Back By and Verified With: STEPHANIE SHAW 05/04/14 1000 BY SMITHERSJ     [facility]formed at [facility]   Report Status 05/06/2014 FINAL   Final  WOUND CULTURE     Status: None   Collection Time    05/04/14 10:48 AM      Result Value Ref Range Status   Specimen Description WOUND RIGHT GROIN   Final   Special Requests NONE   Final   Gram Stain     Final   Value: RARE WBC PRESENT,BOTH PMN AND MONONUCLEAR     NO SQUAMOUS EPITHELIAL CELLS SEEN     NO ORGANISMS SEEN     [facility]   Culture     Final   Value: FEW PSEUDOMONAS AERUGINOSA     [facility]   Report Status 05/07/2014 FINAL    Final   Organism ID, Bacteria PSEUDOMONAS AERUGINOSA   Final  CULTURE, BLOOD (SINGLE)     Status: None   Collection Time    05/05/14  6:50 PM      Result Value Ref Range Status   Specimen Description BLOOD RIGHT HAND   Final   Special Requests BOTTLES DRAWN AEROBIC ONLY 3CC   Final   Culture  Setup Time     Final   Value: 05/05/2014 21:57     [facility]formed [facility]   Culture     Final   Value: NO GROWTH 5 DAYS     [facility]   Report Status 05/11/2014 FINAL   Final  CATH TIP CULTURE     Status: None   Collection Time    05/07/14  9:01 AM      Result Value Ref Range Status   Specimen Description CATH TIP RIGHT FEMORAL DIATEK CATH TIP   Final   Special Requests Normal   Final   Culture     Final   Value: 2 COLONIES PSEUDOMONAS AERUGINOSA     [facility]formed [facility]   Report Status 05/11/2014 FINAL   Final  CULTURE, BLOOD (SINGLE)     Status: None   Collection Time    05/09/14  6:30 PM      Result Value Ref Range Status   Specimen Description BLOOD HEMODIALYSIS CATHETER   Final   Special Requests BOTTLES DRAWN AEROBIC AND ANAEROBIC 10CC   Final   Culture  Setup Time     Final   Value: 05/10/2014 00:48     [facility]formed at [facility]   Culture     Final   Value:        BLOOD CULTURE RECEIVED NO GROWTH TO DATE CULTURE WILL BE HELD FOR 5 DAYS BEFORE ISSUING A FINAL NEGATIVE REPORT     [facility]formed at [facility]   Report Status PENDING   Incomplete   Assessment: 63 yom initially admitted with AMS and respiratory depression. Started on ceftazidime for pseudomonas bacteremia and vancomycin was later added empirically d/t hole in HD catheter. However, pt had access issues so first dose of vancomycin  was never able to be administered on 7/21- patient did receive a dose of vancomycin 1250mg  IV x1 yesterday just prior to receiving an HD session. (went for 4 hours with BFR 320mL/min.) Discussed with Dr. Kathrene Bongo and she would  like 1 more dose of vancomycin.   Vanc 7/14 x 1; 7/23>> Ceftaz 7/18>> Levaquin x 1 7/18 Zosyn 7/16>>7/18  Goal of Therapy:  pre-HD vanc 15-25  Plan:  1. Vancomycin 500mg  IV x 1 tomorrow after HD. If patient is discharged, to be given in outpatient dialysis 2. Continue ceftaz per MD - 2g IV qHD TTS 3. Pharmacy to sign off of vancomycin consult. Please re-consult if needed  Lilianne Delair D. Allahna Husband, PharmD, BCPS Clinical Pharmacist Pager: [phone] 05/12/2014 11:23 AM

## 2014-05-12 NOTE — Progress Notes (Signed)
[facility] Progress Note  Assessment/Plan: 1  Pseudomonas bacteremia due to infected R groin HD cath- cath removed 7/18 replaced 7/21 on IV abx; on IV Fortaz- can continue fortaz with OP HD- subsequent cultures have been negative. 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, per VVS not a candidate for a thigh AVG; appreciate VVS assistance; IR exchanged right groin tunneled dialysis catheter yesterday.- functioned ok 2  ESRD on HD TTS normally- no HD since Saturday due to access issues- HD Thursday with post weight 57.6 net UF 1.1 BFRs 350; re-evaluate bath for d/c 2 K may be to low and labs here do not support need for 2 K bath 4  Hypotension- chronic issue on midodrine  5  Volume excess- resolved  6  Adrenal insufficiency- on HC  7  Anemia on aranesp- iron stores low but chose to not give iv iron due to bacteremia- possibly later as OPtsat 13% ferritin 309 Hgb 9.2 7/21 - needs recheck pre HD  8  PTH cont hectorol, sensipar and renagel, phos 4.0  9  Nutrition - alb 3, multivit  10 Seizures- on dilantin  11 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 Thrombocytopenia - suspect related to infection - HIT neg 03/2014 14. Incontinent of stool - ? Blood in stool - no overt melana - stool was brown but seepage on bed pad looked red - RN to change pt and evaluate further - needs to be sorted out before d/c 15 Dispo- plans for care clearly delineated by palliative care   Sheffield Slider, PA-C [facility] Beeper [phone] 05/12/2014,9:38 AM  LOS: 10 days   Patient seen and examined, agree with above note with above modifications. Finally got HD yesterday PC worked- Incontinent of possible bloody stool this AM- but not sure what we would do about it other than transfuse if he needed it- STAT cbc pending- will continue HD on TTS via new PC- I guess if hgb stable he could go  back to group home today Annie Sable, MD 05/12/2014     Subjective:   nonverbal  Objective Filed Vitals:   05/11/14 2101 05/11/14 2137 05/12/14 0405 05/12/14 0924  BP: 96/57 102/65 103/59 96/53  Pulse: 108 100 91 90  Temp: 98 F (36.7 C) 97.5 F (36.4 C) 98.1 F (36.7 C) 98.1 F (36.7 C)  TempSrc: Oral Oral Oral Oral  Resp: 14 14 14 16   Height:      Weight: 57.6 kg (126 lb 15.8 oz) 57.601 kg (126 lb 15.8 oz)    SpO2: 100% 100% 100% 96%   Physical Exam General: sitting in bed; blank look on face, wearing hand mittens Heart: RRR 2/6 murmur lowdest RLSB Lungs: grossly clear nonparticipatory with exam Abdomen: soft NT; pt sitting in soft brown stool but appeared to have bloody leakage on bed pad Extremities: no LE edema Dialysis Access: right fem cath dressing in tact  Dialysis Orders: [facility]  3hr 56kgs 160 400/1.5 2k/2ca Heparin 3500 R fem cath  hectorol 11 mcg IV/HD Arenesp 100 q week Venofer  Additional Objective Labs: Basic Metabolic Panel:  Recent Labs Lab 05/06/14 0250 05/09/14 0750  NA 140 137  K 4.8 4.5  CL 98  --   CO2 25  --   GLUCOSE 140*  --   BUN 23  --  CREATININE 4.14*  --   CALCIUM 7.8*  --   PHOS 4.0  --   CBC:  Recent Labs Lab 05/06/14 0250 05/09/14 0750  WBC 13.0*  --   HGB 8.5* 9.2*  HCT 26.0* 27.0*  MCV 89.3  --   PLT 113*  --    Blood Culture    Component Value Date/Time   SDES BLOOD HEMODIALYSIS CATHETER 05/09/2014 1830   SPECREQUEST BOTTLES DRAWN AEROBIC AND ANAEROBIC 10CC 05/09/2014 1830   CULT  Value:        BLOOD CULTURE RECEIVED NO GROWTH TO DATE CULTURE WILL BE HELD FOR 5 DAYS BEFORE ISSUING A FINAL NEGATIVE REPORT [facility] at [facility] 05/09/2014 1830   REPTSTATUS PENDING 05/09/2014 1830   CBG:  Recent Labs Lab 05/11/14 1451 05/11/14 2133 05/12/14 0013 05/12/14 0413 05/12/14 0735  GLUCAP 78 78 95 95 79   Iron Studies:  Recent Labs  05/11/14 1955  IRON 23*  TIBC 171*  FERRITIN  309    Studies/Results: Ir Fluoro Guide Cv Line Right  05/11/2014   CLINICAL DATA:  63 year old with end-stage renal disease. Right groin Perm Cath is malfunctioning.  EXAM: EXCHANGE OF TUNNELED DIALYSIS CATHETER WITH FLUOROSCOPY  Physician: Rachelle HoraAdam R. Henn, MD  FLUOROSCOPY TIME:  4 min and 6 seconds  MEDICATIONS: Patient was sedated and monitored by Anesthesia. Ancef 2 g. As antibiotic prophylaxis, Ancef was ordered pre-procedure and administered intravenously within one hour of incision.  ANESTHESIA/SEDATION: Patient was sedated and monitored by Anesthesia.  PROCEDURE: Informed consent was obtained for a catheter exchange. The right groin and existing catheter were prepped and draped in sterile fashion. Maximal barrier sterile technique was utilized including caps, mask, sterile gowns, sterile gloves, sterile drape, hand hygiene and skin antiseptic. The retention sutures were removed. Catheter was removed over a stiff Glidewire. The distal lumen is found to be occluded with clot. A 42 cm tip to cuff HemoSplit catheter was advanced over the wire. The catheter was hubbed in the right groin but the tip was only in the upper IVC. As result, this catheter was removed over the wire. A 50 cm Medcomp Split Cath was advanced over the wire. The tip was placed within the right atrium. The lumens aspirated and flushed well. Appropriate amount of heparin was placed in both lumens. The catheter was sutured to the skin. Sterile dressing was placed. Fluoroscopic images were taken and saved for this procedure.  FINDINGS: New catheter tip in the lower right atrium. Both lumens aspirated and flushed well.  COMPLICATIONS: None  IMPRESSION: Successful exchange of the right groin tunneled dialysis catheter with fluoroscopy.   Electronically Signed   By: Richarda OverlieAdam  Henn M.D.   On: 05/11/2014 16:22   Medications:   . cefTAZidime (FORTAZ)  IV  2 g Intravenous Q T,Th,Sa-HD  . cinacalcet  30 mg Oral BID WC  . darbepoetin (ARANESP)  injection - DIALYSIS  100 mcg Intravenous Q Thu-HD  . dextrose  12.5 g Intravenous Once  . doxercalciferol  10 mcg Intravenous Q T,Th,Sa-HD  . fentaNYL  12.5 mcg Intravenous Once  . midodrine  15 mg Oral TID  . multivitamin  1 tablet Oral QHS  . pantoprazole  40 mg Oral Daily  . phenytoin  200 mg Oral QHS  . sevelamer carbonate  800 mg Oral TID WC  . sodium chloride  3 mL Intravenous Q12H

## 2014-05-12 NOTE — Progress Notes (Signed)
PT Cancellation Note  Patient Details Name: [name] MRN: [id number] DOB: August 14, 1951   Cancelled Treatment:    Reason Eval/Treat Not Completed: Other (comment) (Pt. has DC orders back to his facility; transport here now)   Ferman HammingBlankenship, Jamaris Biernat B 05/12/2014, 4:52 PM Weldon PickingSusan Tyquavious Gamel PT Acute Rehab Services [phone] Beeper [phone]

## 2014-05-12 NOTE — Progress Notes (Signed)
Patient Discharge:  Patient discharged with care giver via w/c accompanied by the caregiver and the staff member. Education: Education was provided to the care giver about the follow up appointments, prescriptions and discharged instructions.  Care giver acknowledged the information. IV: Removed the peripheral IV before discharge. Telemetry: Cardiac monitoring discontinued before discharge. Belongings: Care giver took all his belongings with him.    Sister was called and given information about the patient and she wanted the care giver to sign the discharge paper.

## 2014-05-12 NOTE — Progress Notes (Signed)
Patient combative to staff while trying to get labs.

## 2014-05-15 NOTE — Discharge Summary (Signed)
INTERNAL MEDICINE ATTENDING DISCHARGE COSIGN   I evaluated the patient on the day of discharge and discussed the discharge plan with my resident team. I agree with the discharge documentation and disposition.   Aletta EdouardBHARDWAJ, Zorina Mallin 05/15/2014, 3:26 PM

## 2014-05-16 LAB — CULTURE, BLOOD (SINGLE): CULTURE: NO GROWTH

## 2014-05-17 ENCOUNTER — Telehealth: Payer: Self-pay | Admitting: *Deleted

## 2014-05-17 ENCOUNTER — Encounter: Payer: Self-pay | Admitting: Internal Medicine

## 2014-05-17 ENCOUNTER — Ambulatory Visit (INDEPENDENT_AMBULATORY_CARE_PROVIDER_SITE_OTHER): Payer: Medicare Other | Admitting: Internal Medicine

## 2014-05-17 ENCOUNTER — Telehealth: Payer: Self-pay | Admitting: Neurology

## 2014-05-17 VITALS — BP 90/58 | HR 85 | Temp 97.0°F | Wt 125.3 lb

## 2014-05-17 DIAGNOSIS — K922 Gastrointestinal hemorrhage, unspecified: Secondary | ICD-10-CM

## 2014-05-17 DIAGNOSIS — N186 End stage renal disease: Secondary | ICD-10-CM

## 2014-05-17 DIAGNOSIS — L89309 Pressure ulcer of unspecified buttock, unspecified stage: Secondary | ICD-10-CM | POA: Insufficient documentation

## 2014-05-17 DIAGNOSIS — L89321 Pressure ulcer of left buttock, stage 1: Secondary | ICD-10-CM

## 2014-05-17 DIAGNOSIS — A419 Sepsis, unspecified organism: Secondary | ICD-10-CM

## 2014-05-17 DIAGNOSIS — L8991 Pressure ulcer of unspecified site, stage 1: Secondary | ICD-10-CM

## 2014-05-17 DIAGNOSIS — A4152 Sepsis due to Pseudomonas: Secondary | ICD-10-CM

## 2014-05-17 NOTE — Telephone Encounter (Signed)
Call made to dialysis center. Order given to charge nurse to obtain cbc once a week for 2 wks starting tomorrow per DrModing.  Nurse verbalized understanding and will have results faxed to our office.  Phone call complete.Kingsley SpittleGoldston, Darlene Cassady7/29/20153:35 PM

## 2014-05-17 NOTE — Patient Instructions (Signed)
Thank you for coming to clinic today [name].  General instructions: -It sounds like you infection is resolving.  Continue to get your antibiotic at dialysis appointments until 05/23/14. -Get a CBC blood count drawn at your dialysis center to check your blood counts the next couple of weeks to make sure you are not bleeding. -Continue to monitor the bed sores and keep his skin clean and dry.  Rest him on his side so the sores can heal. -Please make a follow up appointment to return to clinic in 1 month with your PCP Dr. Yetta BarreJones.  Please bring your medicines with you each time you come.   Medicines may be  Eye drops  Herbal   Vitamins  Pills  Seeing these help us take care of you.

## 2014-05-17 NOTE — Progress Notes (Signed)
Subjective:    Patient ID: [name], male    DOB: 08/01/1951, [age] y.o.   MRN: [id number]  HPI Comments: [name] is a 63 year old man with a history of mental retardation, epilepsy, ESRD on hemodialysis, and acquired adrenal insufficiency who presents for hospital follow up.  He does not speak so history was obtained from his caregiver.  [facility]  He has had 4 hospital admissions since that time for GI bleed twice, clotted renal dialysis graft, and most recently sepsis due to pseudomonas from 05/02/14 to 05/12/14.  On 05/02/14, the patient had a change in mental status at dialysis.  He was admitted to the hospital and treated for sepsis due to pseudomonas, ultimately with ceftazidime.  He is currently receiving ceftazidime 2 g with hemodialysis on T/Th/Sa ending on 05/23/14.  During the hospitalization, his femoral hemodialysis catheter was replaced.  His caregiver reports that he has been very weak since he got out of the hospital.  He has not been able to do as much walking as he did in the past. He used to be able to walk on his own, but he currently needs assistance to ambulate.  He has not been having fevers or chills.  His mental status has been at his baseline, and he has not been in pain.  He has been making it to dialysis without issues and has not missed any days.  He has been receiving his ceftazidime at dialysis.  He has also been having incontinence at night, which is new for him.  His caregiver says that he has not been getting up at night to go to the bathroom because he feels more weak.  His caregiver has also noticed that he is having dark stools, but he is not sure if this is normal for him.  They do not smell melenic.  He also has some bed sores near his anus that developed while he was in the hospital, but they have healed since his caregiver has been keeping his skin dry and laying him on his side.     Review of Systems  Constitutional:  Negative for fever and chills.  HENT: Negative for congestion and rhinorrhea.   Respiratory: Negative for choking and shortness of breath.   Gastrointestinal: Positive for vomiting (On Sunday night after dinner.). Negative for diarrhea and constipation.  Endocrine: Negative.   Genitourinary:       Incontinence at night.  Skin: Positive for wound. Negative for rash.  Neurological: Positive for weakness. Negative for headaches.  Psychiatric/Behavioral: Negative for dysphoric mood and agitation.       Objective:   Physical Exam  Constitutional: He appears well-developed and well-nourished. No distress.  Mute.  HENT:  Head: Normocephalic and atraumatic.  Mouth/Throat: Oropharynx is clear and moist.  Eyes: Conjunctivae are normal. Pupils are equal, round, and reactive to light. No scleral icterus.  Neck: Neck supple. No thyromegaly present.  Cardiovascular: Normal rate and regular rhythm.   Pulmonary/Chest: Effort normal and breath sounds normal.  Abdominal: Soft. Bowel sounds are normal.  Musculoskeletal:  Stands and ambulates with assistance.  Lymphadenopathy:    He has no cervical adenopathy.  Neurological: He is alert. No cranial nerve deficit.  Skin: Skin is warm and dry. He is not diaphoretic.  Two healing oval 0.5 cm pink areas of skin on left buttock.  No ulceration currently.  Psychiatric: He has a normal mood and affect.  Assessment & Plan:  Please see problem-based assessment and plan.

## 2014-05-17 NOTE — Telephone Encounter (Signed)
Blood work sent over from Dr. Arrie Aranoladonato indicates a hemoglobin of 7.7, white count of 8.1. Creatinine is 8.1, AST and ALT are unremarkable.

## 2014-05-17 NOTE — Assessment & Plan Note (Signed)
Caregiver noticed a couple of bed sores on left buttock after being in the hospital, likely due to decreased mobility and spending more time on back. He says that the patient usually sleeps on his side, and he has been trying to keep the sores clean.  They appear to be healing well, and the caregiver thinks that they are improved since the hospital discharge. -Caregiver will continue to monitor. -Keep skin clean, dry and covered. -Reposition patient frequently to avoid new sore development.

## 2014-05-17 NOTE — Assessment & Plan Note (Signed)
Patient has a history of gastric ulcer and was hospitalized with upper GI bleed twice in the last 6 months.  His caregiver reports dark stools that may or may not have been melena.  He had one episode of emesis on Sunday nigh, but this was non-bloody, and he has been fine since.  His hemoglobin runs low due to multiple factors, including end-stage renal disease, but his hemoglobin was trending up during hospitalization.  We will have his dialysis center trend his CBCs to determine if he is having any bleeding that could be contributing to his weakness. -CBC at dialysis this Thursday (tomorrow) and next Thursday.

## 2014-05-17 NOTE — Assessment & Plan Note (Addendum)
Infection appears to have resolved, now on ceftazidime and afebrile.  His caregiver reports increased weakness since being discharged from the hospital, that is likely due to deconditioning.  He has had some episodes of incontinence at night because he is not able to get out of bed on his own to get to the bathroom.  He was seen by PT in the hospital, but he was not willing to work with them.  His caregiver thinks that it would be helpful for home health PT to come assess and give him some strengthening exercises. -Continue ceftazidime 2 g IV at dialysis until 05/23/14. -Home health PT referral.

## 2014-05-17 NOTE — Assessment & Plan Note (Signed)
His caregiver reports he has been able to keep up with his dialysis.  He has been told that this is his last dialysis access catheter due to no other sites of access available. -Continue TTS dialysis.

## 2014-05-18 ENCOUNTER — Telehealth: Payer: Self-pay | Admitting: Licensed Clinical Social Worker

## 2014-05-18 NOTE — Telephone Encounter (Signed)
[name] was referred to CSW for Home health services.  Pt is non-verbal and has medicare coverage.  CSW placed call to pt's caregiver to determine if facility utilizes a specific agency.  [name]  CSW placed call to [name]' sister,  Ms. [name].  [name].  [facility] agreement [name].  Sister provided with CSW contact name and information.  [name]

## 2014-05-19 ENCOUNTER — Other Ambulatory Visit: Payer: Self-pay | Admitting: Internal Medicine

## 2014-05-19 DIAGNOSIS — A4152 Sepsis due to Pseudomonas: Secondary | ICD-10-CM

## 2014-05-19 NOTE — Progress Notes (Signed)
I saw and evaluated the patient.  I personally confirmed the key portions of the history and exam documented by Dr. Moding and I reviewed pertinent patient test results.  The assessment, diagnosis, and plan were formulated together and I agree with the documentation in the resident's note. 

## 2014-05-24 NOTE — Addendum Note (Signed)
Addended by: Neomia DearPOWERS, Aarushi Hemric E on: 05/24/2014 06:27 PM   Modules accepted: Orders

## 2014-05-29 ENCOUNTER — Other Ambulatory Visit: Payer: Self-pay | Admitting: *Deleted

## 2014-05-30 MED ORDER — ATORVASTATIN CALCIUM 20 MG PO TABS: 20.0000 mg | ORAL_TABLET | Freq: Every day | ORAL | Status: AC

## 2014-06-02 ENCOUNTER — Other Ambulatory Visit: Payer: Self-pay | Admitting: *Deleted

## 2014-06-06 ENCOUNTER — Other Ambulatory Visit: Payer: Self-pay | Admitting: Internal Medicine

## 2014-06-08 NOTE — Telephone Encounter (Signed)
Refill approved - nurse to call in. 

## 2014-06-09 NOTE — Telephone Encounter (Signed)
Clonazepam already refilled.

## 2014-06-12 NOTE — Telephone Encounter (Signed)
Called to pharm 

## 2014-06-19 ENCOUNTER — Ambulatory Visit (INDEPENDENT_AMBULATORY_CARE_PROVIDER_SITE_OTHER): Payer: Medicare Other | Admitting: Internal Medicine

## 2014-06-19 ENCOUNTER — Encounter: Payer: Self-pay | Admitting: Internal Medicine

## 2014-06-19 ENCOUNTER — Ambulatory Visit: Payer: Medicare Other | Admitting: Internal Medicine

## 2014-06-19 VITALS — BP 119/73 | HR 71 | Temp 98.7°F | Ht 60.0 in | Wt 125.9 lb

## 2014-06-19 DIAGNOSIS — I959 Hypotension, unspecified: Secondary | ICD-10-CM

## 2014-06-19 DIAGNOSIS — K259 Gastric ulcer, unspecified as acute or chronic, without hemorrhage or perforation: Secondary | ICD-10-CM

## 2014-06-19 DIAGNOSIS — R569 Unspecified convulsions: Secondary | ICD-10-CM

## 2014-06-19 DIAGNOSIS — N2581 Secondary hyperparathyroidism of renal origin: Secondary | ICD-10-CM

## 2014-06-19 MED ORDER — CINACALCET HCL 30 MG PO TABS: 30.0000 mg | ORAL_TABLET | Freq: Every day | ORAL | Status: AC

## 2014-06-19 MED ORDER — MIDODRINE HCL 10 MG PO TABS: 15.0000 mg | ORAL_TABLET | Freq: Three times a day (TID) | ORAL | Status: DC

## 2014-06-19 MED ORDER — PANTOPRAZOLE SODIUM 40 MG PO TBEC: 40.0000 mg | DELAYED_RELEASE_TABLET | Freq: Every day | ORAL | Status: DC

## 2014-06-19 MED ORDER — CLONAZEPAM 0.5 MG PO TABS: 0.2500 mg | ORAL_TABLET | Freq: Two times a day (BID) | ORAL | Status: DC

## 2014-06-19 NOTE — Assessment & Plan Note (Signed)
Pt has ESRD on TTS dialysis. Sensipar is one of the oldest medications that patient has been on per caregiver. Phosphorous 4.0 and calcium 7.8 on 05/06/14; no PTH on file per lab review.   - refilled sensipar  daily - will repeat BMP and get PTH at next visit. If Ca++ is still low and PTH >300 will consider adding calcitriol.

## 2014-06-19 NOTE — Assessment & Plan Note (Signed)
Pt has a hx of GI bleeds and on chronic protonix daily. Caregiver reports that patient is doing well w/ med and requesting refill for protonix.  - refilled protonix  daily

## 2014-06-19 NOTE — Assessment & Plan Note (Signed)
Pt's BP today was 119/73. No issues w/ hypotension per caregiver, gets his BP checked at dialysis.  - refilled midodrine  TID

## 2014-06-19 NOTE — Assessment & Plan Note (Signed)
Pt on klonopin 0.5mg  BID and was recently started on dilantin  by Dr. Anne Hahn w/ neurology. Pt has not had any recent seizures per caregiver.   -refilled klonopin 0.5mg  BID - caregiver states he has refills of dilantin which he gets from neurology. Instructed to call neurology clinic if he does need refills of dilantin since per there note on 03/01/14 dilantin labs were pending before they renewed the prescription. They recommended f/u in 1 year.

## 2014-06-19 NOTE — Progress Notes (Signed)
Subjective:     Patient ID: [name], male   DOB: August 05, 1951, 63 y.o.   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 Pt finished his course of IV ceftazidime that was given w/ dialysis on 05/23/14. His caregiver is in the room w/ him today who provides this history. He denies pt having any fevers or recent seizures. Caregiver reports that bed sores on buttocks have completely healed. They have no complaints today and are only requesting refills.    Review of Systems  Constitutional: Negative for fever.  Neurological: Negative for seizures.       Objective:   Physical Exam  Cardiovascular: Normal rate and regular rhythm.   Pulmonary/Chest: Effort normal and breath sounds normal.  Unable to follow instructions to take deep breaths  Abdominal: Soft. Bowel sounds are normal. There is no tenderness.       Assessment:     Please see problem based assessment and plan.        Plan:     Please see problem based assessment and plan.

## 2014-06-21 ENCOUNTER — Other Ambulatory Visit: Payer: Self-pay | Admitting: Internal Medicine

## 2014-06-21 DIAGNOSIS — I959 Hypotension, unspecified: Secondary | ICD-10-CM

## 2014-06-21 MED ORDER — MIDODRINE HCL 10 MG PO TABS: 15.0000 mg | ORAL_TABLET | Freq: Three times a day (TID) | ORAL | Status: AC

## 2014-06-21 NOTE — Progress Notes (Signed)
I saw and evaluated the patient. I personally confirmed the key portions of Dr. Truong's history and exam and reviewed pertinent patient test results. The assessment, diagnosis, and plan were formulated together and I agree with the documentation in the resident's note. 

## 2014-08-12 ENCOUNTER — Other Ambulatory Visit: Payer: Self-pay | Admitting: Internal Medicine

## 2014-09-11 ENCOUNTER — Encounter: Payer: Self-pay | Admitting: Nurse Practitioner

## 2014-10-03 ENCOUNTER — Other Ambulatory Visit: Payer: Self-pay | Admitting: Internal Medicine

## 2014-10-03 DIAGNOSIS — R21 Rash and other nonspecific skin eruption: Secondary | ICD-10-CM

## 2014-10-26 ENCOUNTER — Emergency Department (HOSPITAL_COMMUNITY): Payer: Medicare Other

## 2014-10-26 ENCOUNTER — Encounter (HOSPITAL_COMMUNITY): Payer: Self-pay | Admitting: *Deleted

## 2014-10-26 ENCOUNTER — Inpatient Hospital Stay (HOSPITAL_COMMUNITY)
Admission: EM | Admit: 2014-10-26 | Discharge: 2014-11-01 | DRG: 314 | Disposition: A | Discharge status: Home Health | Payer: Medicare Other | Attending: Internal Medicine | Admitting: Internal Medicine

## 2014-10-26 DIAGNOSIS — Y838 Other surgical procedures as the cause of abnormal reaction of the patient, or of later complication, without mention of misadventure at the time of the procedure: Secondary | ICD-10-CM | POA: Diagnosis present

## 2014-10-26 DIAGNOSIS — I9589 Other hypotension: Secondary | ICD-10-CM | POA: Diagnosis present

## 2014-10-26 DIAGNOSIS — I120 Hypertensive chronic kidney disease with stage 5 chronic kidney disease or end stage renal disease: Secondary | ICD-10-CM | POA: Diagnosis present

## 2014-10-26 DIAGNOSIS — R6521 Severe sepsis with septic shock: Secondary | ICD-10-CM | POA: Diagnosis present

## 2014-10-26 DIAGNOSIS — A4101 Sepsis due to Methicillin susceptible Staphylococcus aureus: Secondary | ICD-10-CM | POA: Diagnosis present

## 2014-10-26 DIAGNOSIS — R4182 Altered mental status, unspecified: Secondary | ICD-10-CM | POA: Diagnosis present

## 2014-10-26 DIAGNOSIS — F79 Unspecified intellectual disabilities: Secondary | ICD-10-CM

## 2014-10-26 DIAGNOSIS — T827XXA Infection and inflammatory reaction due to other cardiac and vascular devices, implants and grafts, initial encounter: Principal | ICD-10-CM | POA: Insufficient documentation

## 2014-10-26 DIAGNOSIS — E785 Hyperlipidemia, unspecified: Secondary | ICD-10-CM | POA: Diagnosis present

## 2014-10-26 DIAGNOSIS — G40909 Epilepsy, unspecified, not intractable, without status epilepticus: Secondary | ICD-10-CM

## 2014-10-26 DIAGNOSIS — K219 Gastro-esophageal reflux disease without esophagitis: Secondary | ICD-10-CM | POA: Diagnosis present

## 2014-10-26 DIAGNOSIS — R1084 Generalized abdominal pain: Secondary | ICD-10-CM

## 2014-10-26 DIAGNOSIS — N186 End stage renal disease: Secondary | ICD-10-CM | POA: Diagnosis present

## 2014-10-26 DIAGNOSIS — D631 Anemia in chronic kidney disease: Secondary | ICD-10-CM | POA: Diagnosis present

## 2014-10-26 DIAGNOSIS — R451 Restlessness and agitation: Secondary | ICD-10-CM | POA: Diagnosis present

## 2014-10-26 DIAGNOSIS — R109 Unspecified abdominal pain: Secondary | ICD-10-CM

## 2014-10-26 DIAGNOSIS — Y832 Surgical operation with anastomosis, bypass or graft as the cause of abnormal reaction of the patient, or of later complication, without mention of misadventure at the time of the procedure: Secondary | ICD-10-CM | POA: Diagnosis present

## 2014-10-26 DIAGNOSIS — E2740 Unspecified adrenocortical insufficiency: Secondary | ICD-10-CM | POA: Diagnosis present

## 2014-10-26 DIAGNOSIS — Z8639 Personal history of other endocrine, nutritional and metabolic disease: Secondary | ICD-10-CM

## 2014-10-26 DIAGNOSIS — G9340 Encephalopathy, unspecified: Secondary | ICD-10-CM | POA: Diagnosis present

## 2014-10-26 DIAGNOSIS — R7881 Bacteremia: Secondary | ICD-10-CM | POA: Diagnosis present

## 2014-10-26 DIAGNOSIS — I8290 Acute embolism and thrombosis of unspecified vein: Secondary | ICD-10-CM | POA: Diagnosis not present

## 2014-10-26 DIAGNOSIS — I5032 Chronic diastolic (congestive) heart failure: Secondary | ICD-10-CM | POA: Diagnosis present

## 2014-10-26 DIAGNOSIS — N2581 Secondary hyperparathyroidism of renal origin: Secondary | ICD-10-CM | POA: Diagnosis present

## 2014-10-26 DIAGNOSIS — I513 Intracardiac thrombosis, not elsewhere classified: Secondary | ICD-10-CM | POA: Insufficient documentation

## 2014-10-26 DIAGNOSIS — T82868A Thrombosis of vascular prosthetic devices, implants and grafts, initial encounter: Secondary | ICD-10-CM | POA: Diagnosis present

## 2014-10-26 DIAGNOSIS — I82220 Acute embolism and thrombosis of inferior vena cava: Secondary | ICD-10-CM | POA: Diagnosis not present

## 2014-10-26 DIAGNOSIS — R4701 Aphasia: Secondary | ICD-10-CM | POA: Diagnosis present

## 2014-10-26 DIAGNOSIS — D649 Anemia, unspecified: Secondary | ICD-10-CM | POA: Diagnosis present

## 2014-10-26 DIAGNOSIS — A4152 Sepsis due to Pseudomonas: Secondary | ICD-10-CM

## 2014-10-26 DIAGNOSIS — Z66 Do not resuscitate: Secondary | ICD-10-CM | POA: Diagnosis not present

## 2014-10-26 DIAGNOSIS — Z992 Dependence on renal dialysis: Secondary | ICD-10-CM

## 2014-10-26 DIAGNOSIS — A498 Other bacterial infections of unspecified site: Secondary | ICD-10-CM

## 2014-10-26 LAB — CBC WITH DIFFERENTIAL/PLATELET
Basophils Absolute: 0 10*3/uL (ref 0.0–0.1)
Basophils Relative: 0 % (ref 0–1)
Eosinophils Absolute: 0 10*3/uL (ref 0.0–0.7)
Eosinophils Relative: 0 % (ref 0–5)
HCT: 29 % — ABNORMAL LOW (ref 39.0–52.0)
Hemoglobin: 8.9 g/dL — ABNORMAL LOW (ref 13.0–17.0)
Lymphocytes Relative: 6 % — ABNORMAL LOW (ref 12–46)
Lymphs Abs: 0.6 10*3/uL — ABNORMAL LOW (ref 0.7–4.0)
MCH: 29.9 pg (ref 26.0–34.0)
MCHC: 30.7 g/dL (ref 30.0–36.0)
MCV: 97.3 fL (ref 78.0–100.0)
Monocytes Absolute: 0.7 10*3/uL (ref 0.1–1.0)
Monocytes Relative: 7 % (ref 3–12)
Neutro Abs: 8.1 10*3/uL — ABNORMAL HIGH (ref 1.7–7.7)
Neutrophils Relative %: 87 % — ABNORMAL HIGH (ref 43–77)
Platelets: 163 10*3/uL (ref 150–400)
RBC: 2.98 MIL/uL — ABNORMAL LOW (ref 4.22–5.81)
RDW: 17.9 % — ABNORMAL HIGH (ref 11.5–15.5)
WBC: 9.4 10*3/uL (ref 4.0–10.5)

## 2014-10-26 LAB — BASIC METABOLIC PANEL
Anion gap: 13 (ref 5–15)
BUN: 12 mg/dL (ref 6–23)
CO2: 27 mmol/L (ref 19–32)
Calcium: 8.5 mg/dL (ref 8.4–10.5)
Chloride: 99 mEq/L (ref 96–112)
Creatinine, Ser: 4.36 mg/dL — ABNORMAL HIGH (ref 0.50–1.35)
GFR calc Af Amer: 15 mL/min — ABNORMAL LOW (ref >90)
GFR calc non Af Amer: 13 mL/min — ABNORMAL LOW (ref >90)
Glucose, Bld: 101 mg/dL — ABNORMAL HIGH (ref 70–99)
Potassium: 3.6 mmol/L (ref 3.5–5.1)
Sodium: 139 mmol/L (ref 135–145)

## 2014-10-26 LAB — LACTIC ACID, PLASMA: Lactic Acid, Venous: 2.3 mmol/L — ABNORMAL HIGH (ref 0.5–2.2)

## 2014-10-26 LAB — PHENYTOIN LEVEL, TOTAL: Phenytoin Lvl: <2.5 ug/mL — ABNORMAL LOW (ref 10.0–20.0)

## 2014-10-26 MED ORDER — IOHEXOL 300 MG/ML  SOLN
100.0000 mL | Freq: Once | INTRAMUSCULAR | Status: AC | PRN
  Administered 2014-10-26: 100 mL via INTRAVENOUS

## 2014-10-26 MED ORDER — SODIUM CHLORIDE 0.9 % IV BOLUS (SEPSIS)
500.0000 mL | Freq: Once | INTRAVENOUS | Status: AC
  Administered 2014-10-26: 500 mL via INTRAVENOUS

## 2014-10-26 MED ORDER — HYDROCORTISONE NA SUCCINATE PF 100 MG IJ SOLR
100.0000 mg | Freq: Once | INTRAMUSCULAR | Status: AC
  Administered 2014-10-26: 100 mg via INTRAVENOUS
  Filled 2014-10-26: qty 2

## 2014-10-26 MED ORDER — IOHEXOL 300 MG/ML  SOLN
25.0000 mL | Freq: Once | INTRAMUSCULAR | Status: AC | PRN
  Administered 2014-10-26: 25 mL via ORAL

## 2014-10-26 NOTE — ED Notes (Signed)
Pts caregiver at bedside. States that pt has not had any diarrhea. States pt does not urinate. States pt lives in a house.

## 2014-10-26 NOTE — ED Notes (Signed)
Pt arrives via EMS from dialysis center. Dialysis center called EMS c/o pt being extra combative, fever and possible UTI. Pt lives at a group home and has hx MR. Dialysis center states that pt did finish treatment.

## 2014-10-26 NOTE — ED Provider Notes (Signed)
CSN: [id number]     Arrival date & time 10/26/14  1707 History   First MD Initiated Contact with Patient 10/26/14 1708     Chief Complaint  Patient presents with  . Urinary Frequency     (Consider location/radiation/quality/duration/timing/severity/associated sxs/prior Treatment) HPI  63yM with PMHx of mental retardation, ESRD on dialysis, hypotension, acquired adrenal insufficiency, anemia, and history of seizures who presents to the ED from dialysis center for acute change in mental status. Hx from EMS and review of prior records. Dialysis staff noticed a change in his typical behavior. Pt was somewhat combative at dialysis and then incontinent of stool and reportedly urine? Pt appears to have been on dialysis for some time and not sure is he actually makes much urine or not. Normally pt is nonverbal at baseline, but will grunt in response. EMS reports that he did grunt on occasion en route to ED. When asking pt if he had any pain he placed his hand on mid abdomen. Does not seem tender though. When asked again, he placed his hand in the same place. Recorded temp at dialysis was 99. He did reportedly complete his session.    Past Medical History  Diagnosis Date  . Heart murmur, systolic 08/10/2009  . Syncope 05/07/2009  . 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 Esophageal varices 11/07/2008  . Gastric ulcer 10/09    antral, with h pylori positive  . Congestive heart failure 03/06/2008  . Cellulitis and abscess of leg, except foot 03/06/2008  . Secondary hyperparathyroidism 02/02/2008  . Mute 02/02/2008  . Hyperlipidemia 02/02/2008  . Anemia 02/02/2008  . ESRD (end stage renal disease)     TTS hemodialysis  . GERD (gastroesophageal reflux disease)   . Hypertension 02/02/2008    in history  . Mental retardation 02/02/2008  . Mute   . Seizures     "non in a while at home". none in past year .  Marland Kitchen Complication of anesthesia     in the past BP has dropped   . Sepsis due to  Pseudomonas species 05/05/2014   Past Surgical History  Procedure Laterality Date  . Left forearm graft      for HD  . Arteriovenous graft placement  11/22/10    Right thigh AVG  . Thrombectomy and revision of arterioventous (av) goretex  graft    . Thrombectomy and revision of arterioventous (av) goretex  graft  10/22/2012    Procedure: THROMBECTOMY AND REVISION OF ARTERIOVENTOUS (AV) GORETEX  GRAFT;  Surgeon: Larina Earthly, MD;  Location: [facility] OR;  Service: Vascular;  Laterality: Right;  . Thrombectomy w/ embolectomy  11/10/2012    Procedure: THROMBECTOMY ARTERIOVENOUS GORE-TEX GRAFT;  Surgeon: Pryor Ochoa, MD;  Location: [facility] OR;  Service: Vascular;  Laterality: Right;  . Thrombectomy and revision of arterioventous (av) goretex  graft Right 12/08/2012    Procedure: THROMBECTOMY AND REVISION OF ARTERIOVENTOUS (AV) GORETEX  GRAFT right thigh;  Surgeon: Sherren Kerns, MD;  Location: [facility] OR;  Service: Vascular;  Laterality: Right;  Susie Cassette N/A 12/08/2012    Procedure: VENOGRAM;  Surgeon: Sherren Kerns, MD;  Location: [facility] OR;  Service: Vascular;  Laterality: N/A;  Intraoperative Central venogram  . Thrombectomy w/ embolectomy Right 12/12/2012    Procedure: THROMBECTOMY ARTERIOVENOUS GORE-TEX GRAFT;  Surgeon: Nada Libman, MD;  Location: [facility] OR;  Service: Vascular;  Laterality: Right;  . Insertion of dialysis catheter Left 12/14/2012    Procedure: INSERTION OF DIALYSIS CATHETER;  Surgeon: Faylene Million  Janae Bridgeman, MD;  Location: [facility] OR;  Service: Vascular;  Laterality: Left;  . Insertion of dialysis catheter Right 01/13/2013    Procedure: INSERTION OF DIALYSIS CATHETER;  Surgeon: Nada Libman, MD;  Location: [facility] OR;  Service: Vascular;  Laterality: Right;  . Removal of a dialysis catheter Left 01/13/2013    Procedure: REMOVAL OF A DIALYSIS CATHETER;  Surgeon: Nada Libman, MD;  Location: [facility] OR;  Service: Vascular;  Laterality: Left;  . Av fistula placement Left 02/11/2013    Procedure: INSERTION OF  ARTERIOVENOUS (AV) GORE-TEX GRAFT THIGH;  Surgeon: Nada Libman, MD;  Location: [facility] OR;  Service: Vascular;  Laterality: Left;  using 6mm x 50cm Gore-Tex Vascular Graft  . Esophagogastroduodenoscopy N/A 02/16/2013    Procedure: ESOPHAGOGASTRODUODENOSCOPY (EGD);  Surgeon: Vertell Novak., MD;  Location: [facility] ENDOSCOPY;  Service: Endoscopy;  Laterality: N/A;  bedside  . Esophagogastroduodenoscopy N/A 09/14/2013    Procedure: ESOPHAGOGASTRODUODENOSCOPY (EGD);  Surgeon: Vertell Novak., MD;  Location: [facility] ENDOSCOPY;  Service: Endoscopy;  Laterality: N/A;  control of bleeding if needed  . Esophagogastroduodenoscopy N/A 10/03/2013    Procedure: ESOPHAGOGASTRODUODENOSCOPY (EGD);  Surgeon: Theda Belfast, MD;  Location: [facility] ENDOSCOPY;  Service: Endoscopy;  Laterality: N/A;  Bedside  . Radiology with anesthesia Right 10/19/2013    Procedure: RADIOLOGY WITH ANESTHESIA;  Surgeon: Malachy Moan, MD;  Location: [facility] OR;  Service: Radiology;  Laterality: Right;  . Radiology with anesthesia Left 12/28/2013    Procedure: RADIOLOGY WITH ANESTHESIA;  Surgeon: Reola Calkins, MD;  Location: [facility] OR;  Service: Radiology;  Laterality: Left;  . Thrombectomy and revision of arterioventous (av) goretex  graft Left 01/25/2014    Procedure: THROMBECTOMY AND REVISION OF LEFT THIGH ARTERIOVENTOUS (AV) GORETEX  GRAFT WITH PATCH ANGIOPLASTY;  Surgeon: Pryor Ochoa, MD;  Location: [facility] OR;  Service: Vascular;  Laterality: Left;  . Thrombectomy and revision of arterioventous (av) goretex  graft Left 04/10/2014    Procedure: THROMBECTOMY AND REVISION OF ARTERIOVENTOUS (AV) GORETEX  GRAFT;  Surgeon: Sherren Kerns, MD;  Location: [facility] OR;  Service: Vascular;  Laterality: Left;  . Patch angioplasty Left 04/10/2014    Procedure: PATCH ANGIOPLASTY LEFT SFA;  Surgeon: Sherren Kerns, MD;  Location: [facility] OR;  Service: Vascular;  Laterality: Left;  . Insertion of dialysis catheter Bilateral 04/11/2014    Procedure: INSERTION OF DIALYSIS  CATHETER RIGHT FEMORAL VEIN; INSERTION OF TRIPLE LUMEN LEFT FEMORAL VEIN CENTRAL LINE; REMOVAL OF DIALYSIS CATHETER IN RIGHT FEMORAL VEIN.;  Surgeon: Nada Libman, MD;  Location: [facility] OR;  Service: Vascular;  Laterality: Bilateral;  . Exchange of a dialysis catheter Right 04/29/2014    Procedure: EXCHANGE OF A  FEMORAL DIALYSIS CATHETER;  Surgeon: Pryor Ochoa, MD;  Location: [facility] OR;  Service: Vascular;  Laterality: Right;  . Insertion of dialysis catheter Right 05/09/2014    Procedure: INSERTION OF DIALYSIS CATHETER- FEMORAL;  Surgeon: Fransisco Hertz, MD;  Location: [facility] OR;  Service: Vascular;  Laterality: Right;  . Radiology with anesthesia N/A 05/11/2014    Procedure: RADIOLOGY WITH ANESTHESIA;  Surgeon: Durwin Glaze III, MD;  Location: [facility] OR;  Service: Radiology;  Laterality: N/A;   No family history on file. History  Substance Use Topics  . Smoking status: Never Smoker   . Smokeless tobacco: Never Used  . Alcohol Use: No    Review of Systems  All systems reviewed and negative, other than as noted in HPI.   Allergies  Review of  patient's allergies indicates no known allergies.  Home Medications   Prior to Admission medications   Medication Sig Start Date End Date Taking? Authorizing Provider  atorvastatin (LIPITOR) 20 MG tablet Take 1 tablet (20 mg total) by mouth at bedtime.    Courtney ParisEden W Jones, MD  cinacalcet (SENSIPAR) 30 MG tablet Take 1 tablet (30 mg total) by mouth daily with breakfast. 06/19/14   Gara Kroneriana Truong, MD  clonazePAM (KLONOPIN) 0.5 MG tablet Take 0.5 tablets (0.25 mg total) by mouth 2 (two) times daily. 06/19/14   Gara Kroneriana Truong, MD  folic acid-vitamin b complex-vitamin c-selenium-zinc (DIALYVITE) 3 MG TABS tablet Take 1 tablet by mouth daily.    Historical Provider, MD  midodrine (PROAMATINE) 10 MG tablet Take 1.5 tablets (15 mg total) by mouth 3 (three) times daily. 06/21/14   Gara Kroneriana Truong, MD  pantoprazole (PROTONIX) 40 MG tablet Take 1 tablet (40 mg total) by mouth  daily. 06/19/14   Gara Kroneriana Truong, MD  phenytoin (DILANTIN) 100 MG ER capsule Take 200 mg by mouth at bedtime.    Historical Provider, MD  sevelamer carbonate (RENVELA) 800 MG tablet Take 800 mg by mouth 3 (three) times daily with meals.    Historical Provider, MD   There were no vitals taken for this visit. Physical Exam  Constitutional: He appears well-developed and well-nourished. No distress.  Patient is sitting in bed with his eyes open. Staring straight ahead and does not make eye contact.  HENT:  Head: Normocephalic and atraumatic.  Crusting rhinorrhea  Eyes: Conjunctivae are normal.  Neck: Neck supple.  Cardiovascular: Regular rhythm and normal heart sounds.  Exam reveals no gallop and no friction rub.   No murmur heard. Tachycardic r femoral dialysis cath w clean/dry dressing. No concerning surrounding skin changes. L groin has fluctuant mass medial aspect inguinal crease. No redness. No increased warmth. Nontender. Nonpulsatile.   Pulmonary/Chest: Effort normal and breath sounds normal. No respiratory distress.  Abdominal: Soft. He exhibits no distension. There is no tenderness.  Musculoskeletal: He exhibits no edema or tenderness.  Neurological: He is alert.  Skin: Skin is warm and dry.  Psychiatric: His behavior is normal.  Nursing note and vitals reviewed.   ED Course  Procedures (including critical care time) Labs Review Labs Reviewed - No data to display  Imaging Review Ct Abdomen Pelvis W Contrast  10/26/2014   CLINICAL DATA:  Patient arrives via EMS from dialysis center. Patient was extremely combative had a fever and possible UTI. Generalized abdominal pain.  EXAM: CT ABDOMEN AND PELVIS WITH CONTRAST  TECHNIQUE: Multidetector CT imaging of the abdomen and pelvis was performed using the standard protocol following bolus administration of intravenous contrast.  CONTRAST:  100mL OMNIPAQUE IOHEXOL 300 MG/ML  SOLN  COMPARISON:  CT chest abdomen and pelvis 10/05/2013   FINDINGS: Evaluation of lung bases is limited due to motion artifact. Suggestion of vascular prominence and interstitial changes probably due to edema and fluid overload.  Examination is technically limited due to motion artifact. The liver, spleen, pancreas, adrenal glands, and retroperitoneal lymph nodes appear grossly unremarkable. Gallbladder is contracted appears to be filled with sludge or possibly small stones. No bile duct dilatation. Multi-cystic appearance of the kidneys, some with calcification and some with increased density suggesting hemorrhage. Appearance is grossly unchanged since prior study. No hydronephrosis. Calcification of the abdominal aorta without aneurysm. Right femoral approach intravenous catheter with tip in the right atrium consistent with dialysis catheter. Stomach, small bowel, and colon appear decompressed. No free air or  free fluid in the abdomen. Multiple venous collateral vessels are demonstrated throughout the subcutaneous soft tissues of the abdominal wall bilaterally. Previous study demonstrate sclerosis of the superior vena cava, likely accounting for the collaterals.  Pelvis: Stool in the rectosigmoid colon. No inflammatory changes. Bladder is decompressed. Prostate gland is not significantly enlarged. No free or loculated pelvic fluid collections. There is a fluid collection measuring 4.6 x 5 x 7.2 cm demonstrated in the right groin adjacent to vascular grafts arising from the common femoral artery. This is new since previous study and could represent developing hematoma. Alternatively, infected graft with abscess could also have this appearance. Infiltration in the subcutaneous fat consistent with edema. Mild prominence of lymph nodes in the groin regions bilaterally likely are reactive. With the mild lumbar scoliosis convex towards the left. Degenerative changes in the lumbar spine. No destructive bone lesions appreciated.  IMPRESSION: Multi-cystic appearance of both  kidneys, unchanged since prior study. Multiple venous collaterals demonstrated throughout the subcutaneous soft tissues of the abdominal wall consistent with sclerosis of the superior vena cava noted on prior chest CT. Interval development of a fluid collection in the left groin region adjacent to vascular grafts. This could represent hematoma, pseudoaneurysm, or infection related to the graft. Sludge or small stones in the gallbladder.   Electronically Signed   By: Burman Nieves M.D.   On: 10/26/2014 23:42   Dg Chest Portable 1 View  10/26/2014   CLINICAL DATA:  Altered mental status. Congestive heart failure. Hypertension.  EXAM: PORTABLE CHEST - 1 VIEW  COMPARISON:  05/09/2014  FINDINGS: Right groin tunneled dialysis catheter noted, terminating in the right atrium.  The patient is rotated to the Right on today's radiograph, reducing diagnostic sensitivity and specificity. Low lung volumes. Mild atherosclerotic calcification of the aortic arch.  The lungs appear clear.  IMPRESSION: 1. Low lung volumes.  However, the lungs appear clear. 2. Dialysis catheter tip: Right atrium.   Electronically Signed   By: Herbie Baltimore M.D.   On: 10/26/2014 18:17     EKG Interpretation None      MDM   Final diagnoses:  None    64 year old male sent from dialysis for evaluation change in his mental status. Unfortunately history is limited secondary to patient's underlying MR/mute. Afebrile orally. Will check a rectal temperature. Tachycardic. He is hypotensive as well. Previous mention of low BP on previous admission note, but historically does not seem to run this low.  Vitals - 1 value per visit 10/26/2014 06/19/2014 05/17/2014 05/12/2014 05/11/2014  SYSTOLIC 97 119 90 105   DIASTOLIC 59 73 58 49    Vitals - 1 value per visit 05/02/2014 04/29/2014 04/27/2014 04/11/2014 04/10/2014  SYSTOLIC  113 89    DIASTOLIC  71 58     Vitals - 1 value per visit 03/01/2014 01/25/2014 12/28/2013 10/19/2013  SYSTOLIC 85 130 102  103  DIASTOLIC 57 73 61 57   Vitals - 1 value per visit 10/08/2013  SYSTOLIC 104  DIASTOLIC 59    Not sure how close current BP may be to his norm versus an acute process. Will bolus 500cc for now. Stress dose steroids.  Labs including blood cultures. Chest x-ray. Can try to catheterize for urine although likely anuric. He did place his hand on the abdomen when asked if he is having any pain and again peu his hand in the same position on repeat questioning. His abdomen is soft, nondistended and does not seem tender. I'm unsure of how reliable this  may be.   Care taker [facility]) now present and providing additional history. He reports that since Tuesday evening he has been irritable. When getting near him, the pt will try to push him away. Has been eating less. Generally seems to be want to be left alone. Caretaker reports that this is the way he typically acts when he does not feel well. Sometimes communicates pain by grunting and pointing to area that hurts, but has not been doing this recently. No vomiting. Diarrhea at dialysis today is new. Confirms he is anuric. No recent trauma that caretaker is aware of.   CT as above. No clear explanation for possible abdominal pain. Not sure L groin collection is of actual clinical significance. Nonpusatile. No overlying skin changes. Does not seem tender at all. Site has not been used in quite some time. Has complicated vascular access history. Has L thigh graft placed by Dr Myra Gianotti April 2014.  Per review of history, this site does not appears to have been accessed in awhile. Will touch base with vascular surgery.   Discussed with Dr Edilia Bo, vascular surgery. Suspects that probably lymphocele and not concerned about it without other local clinical findings.      Raeford Razor, MD 10/27/14 [phone]

## 2014-10-26 NOTE — ED Notes (Signed)
pts sisters at bedside

## 2014-10-27 ENCOUNTER — Inpatient Hospital Stay (HOSPITAL_COMMUNITY): Payer: Medicare Other

## 2014-10-27 ENCOUNTER — Encounter (HOSPITAL_COMMUNITY): Payer: Self-pay | Admitting: General Practice

## 2014-10-27 DIAGNOSIS — Y832 Surgical operation with anastomosis, bypass or graft as the cause of abnormal reaction of the patient, or of later complication, without mention of misadventure at the time of the procedure: Secondary | ICD-10-CM | POA: Diagnosis present

## 2014-10-27 DIAGNOSIS — E785 Hyperlipidemia, unspecified: Secondary | ICD-10-CM | POA: Diagnosis present

## 2014-10-27 DIAGNOSIS — R4182 Altered mental status, unspecified: Secondary | ICD-10-CM | POA: Diagnosis present

## 2014-10-27 DIAGNOSIS — I120 Hypertensive chronic kidney disease with stage 5 chronic kidney disease or end stage renal disease: Secondary | ICD-10-CM | POA: Diagnosis present

## 2014-10-27 DIAGNOSIS — Z992 Dependence on renal dialysis: Secondary | ICD-10-CM | POA: Diagnosis not present

## 2014-10-27 DIAGNOSIS — I5032 Chronic diastolic (congestive) heart failure: Secondary | ICD-10-CM | POA: Diagnosis present

## 2014-10-27 DIAGNOSIS — R451 Restlessness and agitation: Secondary | ICD-10-CM | POA: Diagnosis present

## 2014-10-27 DIAGNOSIS — T82868A Thrombosis of vascular prosthetic devices, implants and grafts, initial encounter: Secondary | ICD-10-CM | POA: Diagnosis present

## 2014-10-27 DIAGNOSIS — G9340 Encephalopathy, unspecified: Secondary | ICD-10-CM | POA: Diagnosis not present

## 2014-10-27 DIAGNOSIS — I9589 Other hypotension: Secondary | ICD-10-CM

## 2014-10-27 DIAGNOSIS — E2740 Unspecified adrenocortical insufficiency: Secondary | ICD-10-CM

## 2014-10-27 DIAGNOSIS — K219 Gastro-esophageal reflux disease without esophagitis: Secondary | ICD-10-CM | POA: Diagnosis present

## 2014-10-27 DIAGNOSIS — D649 Anemia, unspecified: Secondary | ICD-10-CM

## 2014-10-27 DIAGNOSIS — F79 Unspecified intellectual disabilities: Secondary | ICD-10-CM

## 2014-10-27 DIAGNOSIS — T827XXA Infection and inflammatory reaction due to other cardiac and vascular devices, implants and grafts, initial encounter: Secondary | ICD-10-CM | POA: Diagnosis present

## 2014-10-27 DIAGNOSIS — R4701 Aphasia: Secondary | ICD-10-CM | POA: Diagnosis present

## 2014-10-27 DIAGNOSIS — Z66 Do not resuscitate: Secondary | ICD-10-CM | POA: Diagnosis not present

## 2014-10-27 DIAGNOSIS — T82898A Other specified complication of vascular prosthetic devices, implants and grafts, initial encounter: Secondary | ICD-10-CM | POA: Diagnosis not present

## 2014-10-27 DIAGNOSIS — Y838 Other surgical procedures as the cause of abnormal reaction of the patient, or of later complication, without mention of misadventure at the time of the procedure: Secondary | ICD-10-CM | POA: Diagnosis present

## 2014-10-27 DIAGNOSIS — N2581 Secondary hyperparathyroidism of renal origin: Secondary | ICD-10-CM | POA: Diagnosis present

## 2014-10-27 DIAGNOSIS — G40909 Epilepsy, unspecified, not intractable, without status epilepticus: Secondary | ICD-10-CM | POA: Diagnosis present

## 2014-10-27 DIAGNOSIS — Z8639 Personal history of other endocrine, nutritional and metabolic disease: Secondary | ICD-10-CM

## 2014-10-27 DIAGNOSIS — D631 Anemia in chronic kidney disease: Secondary | ICD-10-CM | POA: Diagnosis present

## 2014-10-27 DIAGNOSIS — R6521 Severe sepsis with septic shock: Secondary | ICD-10-CM | POA: Diagnosis present

## 2014-10-27 DIAGNOSIS — A4101 Sepsis due to Methicillin susceptible Staphylococcus aureus: Secondary | ICD-10-CM | POA: Diagnosis present

## 2014-10-27 DIAGNOSIS — I959 Hypotension, unspecified: Secondary | ICD-10-CM | POA: Diagnosis not present

## 2014-10-27 DIAGNOSIS — I82220 Acute embolism and thrombosis of inferior vena cava: Secondary | ICD-10-CM | POA: Diagnosis not present

## 2014-10-27 DIAGNOSIS — N186 End stage renal disease: Secondary | ICD-10-CM | POA: Diagnosis present

## 2014-10-27 LAB — CBC
HCT: 27.7 % — ABNORMAL LOW (ref 39.0–52.0)
HEMOGLOBIN: 8.5 g/dL — AB (ref 13.0–17.0)
MCH: 29.9 pg (ref 26.0–34.0)
MCHC: 30.7 g/dL (ref 30.0–36.0)
MCV: 97.5 fL (ref 78.0–100.0)
Platelets: 183 10*3/uL (ref 150–400)
RBC: 2.84 MIL/uL — ABNORMAL LOW (ref 4.22–5.81)
RDW: 17.9 % — ABNORMAL HIGH (ref 11.5–15.5)
WBC: 8.1 10*3/uL (ref 4.0–10.5)

## 2014-10-27 LAB — INFLUENZA PANEL BY PCR (TYPE A & B)
H1N1FLUPCR: NOT DETECTED
INFLBPCR: NEGATIVE
Influenza A By PCR: NEGATIVE

## 2014-10-27 LAB — LIPID PANEL
CHOL/HDL RATIO: 2.4 ratio
Cholesterol: 137 mg/dL (ref 0–200)
HDL: 57 mg/dL (ref >39)
LDL CALC: 44 mg/dL (ref 0–99)
Triglycerides: 180 mg/dL — ABNORMAL HIGH (ref <150)
VLDL: 36 mg/dL (ref 0–40)

## 2014-10-27 LAB — MAGNESIUM: MAGNESIUM: 2 mg/dL (ref 1.5–2.5)

## 2014-10-27 LAB — PROCALCITONIN: PROCALCITONIN: 29.74 ng/mL

## 2014-10-27 LAB — LACTIC ACID, PLASMA: LACTIC ACID, VENOUS: 1.6 mmol/L (ref 0.5–2.2)

## 2014-10-27 LAB — MRSA PCR SCREENING: MRSA by PCR: POSITIVE — AB

## 2014-10-27 LAB — TROPONIN I: Troponin I: 0.03 ng/mL (ref <0.031)

## 2014-10-27 LAB — APTT: APTT: 42 s — AB (ref 24–37)

## 2014-10-27 LAB — PROTIME-INR
INR: 1.36 (ref 0.00–1.49)
PROTHROMBIN TIME: 16.9 s — AB (ref 11.6–15.2)

## 2014-10-27 LAB — PHOSPHORUS: Phosphorus: 5.8 mg/dL — ABNORMAL HIGH (ref 2.3–4.6)

## 2014-10-27 LAB — TSH: TSH: 4.382 u[IU]/mL (ref 0.350–4.500)

## 2014-10-27 MED ORDER — PIPERACILLIN-TAZOBACTAM IN DEX 2-0.25 GM/50ML IV SOLN
2.2500 g | Freq: Three times a day (TID) | INTRAVENOUS | Status: DC
  Administered 2014-10-27 (×3): 2.25 g via INTRAVENOUS
  Filled 2014-10-27 (×7): qty 50

## 2014-10-27 MED ORDER — SODIUM CHLORIDE 0.9 % IV SOLN
500.0000 mg | INTRAVENOUS | Status: DC
  Administered 2014-10-28 – 2014-10-31 (×2): 500 mg via INTRAVENOUS
  Filled 2014-10-27 (×4): qty 500

## 2014-10-27 MED ORDER — ATORVASTATIN CALCIUM 20 MG PO TABS
20.0000 mg | ORAL_TABLET | Freq: Every day | ORAL | Status: DC
  Administered 2014-10-30: 20 mg via ORAL
  Filled 2014-10-27 (×7): qty 1

## 2014-10-27 MED ORDER — RENA-VITE PO TABS
1.0000 | ORAL_TABLET | Freq: Every day | ORAL | Status: DC
  Administered 2014-10-30: 1 via ORAL
  Filled 2014-10-27 (×6): qty 1

## 2014-10-27 MED ORDER — SODIUM CHLORIDE 0.9 % IV BOLUS (SEPSIS)
250.0000 mL | Freq: Once | INTRAVENOUS | Status: AC
  Administered 2014-10-27: 250 mL via INTRAVENOUS

## 2014-10-27 MED ORDER — HYDROCORTISONE NA SUCCINATE PF 100 MG IJ SOLR
50.0000 mg | Freq: Once | INTRAMUSCULAR | Status: AC
  Administered 2014-10-28: 50 mg via INTRAVENOUS
  Filled 2014-10-27: qty 1

## 2014-10-27 MED ORDER — SODIUM CHLORIDE 0.9 % IV SOLN: 62.5000 mg | INTRAVENOUS | Status: DC

## 2014-10-27 MED ORDER — PANTOPRAZOLE SODIUM 40 MG PO TBEC
40.0000 mg | DELAYED_RELEASE_TABLET | Freq: Every day | ORAL | Status: DC
  Administered 2014-10-28 – 2014-10-31 (×2): 40 mg via ORAL
  Filled 2014-10-27 (×3): qty 1

## 2014-10-27 MED ORDER — HEPARIN SODIUM (PORCINE) 5000 UNIT/ML IJ SOLN
5000.0000 [IU] | Freq: Three times a day (TID) | INTRAMUSCULAR | Status: DC
  Administered 2014-10-27 – 2014-10-28 (×3): 5000 [IU] via SUBCUTANEOUS
  Filled 2014-10-27 (×6): qty 1

## 2014-10-27 MED ORDER — PHENYTOIN SODIUM EXTENDED 30 MG PO CAPS
250.0000 mg | ORAL_CAPSULE | Freq: Every day | ORAL | Status: DC
  Administered 2014-10-30: 250 mg via ORAL
  Filled 2014-10-27 (×7): qty 5

## 2014-10-27 MED ORDER — PHENYTOIN SODIUM 50 MG/ML IJ SOLN
1000.0000 mg | Freq: Once | INTRAMUSCULAR | Status: AC
  Administered 2014-10-27: 1000 mg via INTRAVENOUS
  Filled 2014-10-27: qty 20

## 2014-10-27 MED ORDER — ACETAMINOPHEN 650 MG RE SUPP
650.0000 mg | Freq: Four times a day (QID) | RECTAL | Status: DC | PRN
  Filled 2014-10-27: qty 1

## 2014-10-27 MED ORDER — CINACALCET HCL 30 MG PO TABS
30.0000 mg | ORAL_TABLET | Freq: Every day | ORAL | Status: DC
  Administered 2014-10-31: 30 mg via ORAL
  Filled 2014-10-27 (×7): qty 1

## 2014-10-27 MED ORDER — ACETAMINOPHEN 325 MG PO TABS
650.0000 mg | ORAL_TABLET | Freq: Four times a day (QID) | ORAL | Status: DC | PRN
  Administered 2014-10-28 – 2014-10-30 (×2): 650 mg via ORAL
  Filled 2014-10-27 (×2): qty 2

## 2014-10-27 MED ORDER — DIALYVITE 3000 3 MG PO TABS: 1.0000 | ORAL_TABLET | Freq: Every day | ORAL | Status: DC

## 2014-10-27 MED ORDER — SODIUM CHLORIDE 0.9 % IV SOLN
1250.0000 mg | Freq: Once | INTRAVENOUS | Status: AC
  Administered 2014-10-27: 1250 mg via INTRAVENOUS
  Filled 2014-10-27: qty 1250

## 2014-10-27 MED ORDER — HYDROCORTISONE NA SUCCINATE PF 100 MG IJ SOLR
50.0000 mg | Freq: Once | INTRAMUSCULAR | Status: AC
  Administered 2014-10-27: 50 mg via INTRAVENOUS
  Filled 2014-10-27: qty 1

## 2014-10-27 MED ORDER — MIDODRINE HCL 5 MG PO TABS
15.0000 mg | ORAL_TABLET | Freq: Three times a day (TID) | ORAL | Status: DC
  Administered 2014-10-27 – 2014-10-30 (×5): 15 mg via ORAL
  Filled 2014-10-27 (×19): qty 3

## 2014-10-27 MED ORDER — SEVELAMER CARBONATE 800 MG PO TABS
800.0000 mg | ORAL_TABLET | Freq: Three times a day (TID) | ORAL | Status: DC
  Administered 2014-10-28 – 2014-10-30 (×3): 800 mg via ORAL
  Filled 2014-10-27 (×16): qty 1

## 2014-10-27 MED ORDER — CHLORHEXIDINE GLUCONATE CLOTH 2 % EX PADS
6.0000 | MEDICATED_PAD | Freq: Every day | CUTANEOUS | Status: AC
  Administered 2014-10-27 – 2014-10-31 (×5): 6 via TOPICAL

## 2014-10-27 MED ORDER — DARBEPOETIN ALFA 100 MCG/0.5ML IJ SOSY: 100.0000 ug | PREFILLED_SYRINGE | INTRAMUSCULAR | Status: DC

## 2014-10-27 MED ORDER — DOXERCALCIFEROL 2.5 MCG PO CAPS
3.0000 ug | ORAL_CAPSULE | ORAL | Status: DC
  Administered 2014-10-28: 3 ug via ORAL
  Filled 2014-10-27 (×2): qty 1

## 2014-10-27 MED ORDER — MUPIROCIN 2 % EX OINT
1.0000 "application " | TOPICAL_OINTMENT | Freq: Two times a day (BID) | CUTANEOUS | Status: AC
  Administered 2014-10-27 – 2014-10-31 (×6): 1 via NASAL
  Filled 2014-10-27 (×3): qty 22

## 2014-10-27 NOTE — Progress Notes (Signed)
Per order right femoral HDC accessed for blood cultures x2. Arterial port used. 5ml of blood into each bottle. Line flushed and heparin 1,000  Units per mL instilled post blood draw. (2.7) mL used. Line capped and taped per protocol.

## 2014-10-27 NOTE — Plan of Care (Signed)
Problem: Phase II Progression Outcomes Goal: Vital signs remain stable Outcome: Not Progressing Pt has elevated temp of 100.6

## 2014-10-27 NOTE — Procedures (Signed)
ELECTROENCEPHALOGRAM REPORT  Date of Study: 10/27/2014  Patient's Name: [name] MRN: [id number] Date of Birth: 06/25/1951  Referring Provider: Dr. Debe CoderEmily Mullen  Clinical History: This is a 64 year old man with non-verbal mental retardation, ESRD, and history of seizures who presents with agitation.   Medications: Phenytoin (DILANTIN) ER capsule 250 mg acetaminophen (TYLENOL) suppository 650 mg  atorvastatin (LIPITOR) tablet 20 mg cinacalcet (SENSIPAR) tablet 30 mg midodrine (PROAMATINE) tablet 15 mg pantoprazole (PROTONIX) EC tablet 40 mg piperacillin-tazobactam (ZOSYN) IVPB 2.25 g vancomycin (VANCOCIN) 500 mg in sodium chloride 0.9 % 100 mL IVPB  Technical Summary: A multichannel digital EEG recording measured by the international 10-20 system with electrodes applied with paste and impedances below 5000 ohms performed in our laboratory with EKG monitoring in an awake and asleep patient.  Hyperventilation and photic stimulation were not performed.  The digital EEG was referentially recorded, reformatted, and digitally filtered in a variety of bipolar and referential montages for optimal display.    Description: The patient is awake and asleep during the recording.  During maximal wakefulness, there is no clear posterior dominant rhythm. There is muscle artifact obscuring the EEG during wakefulness, in between artifact, there was no focal slowing seen. With drowsiness and sleep, there is an increase in theta and delta slowing of the background.  Vertex waves and symmetric sleep spindles were seen.  Hyperventilation and photic stimulation were not performed.  There were no epileptiform discharges or electrographic seizures seen.    EKG lead showed sinus tachycarida.  Impression: This awake and asleep EEG is within normal limits.  Clinical Correlation: A normal EEG does not exclude a clinical diagnosis of epilepsy.  Clinical correlation is advised.   Patrcia DollyKaren Fantasy Donald, M.D.

## 2014-10-27 NOTE — Evaluation (Signed)
Clinical/Bedside Swallow Evaluation Patient Details  Name: [name] MRN: [id number] Date of Birth: [birth year]  Today's Date: 10/27/2014 Time: 1207-1220 SLP Time Calculation (min) (ACUTE ONLY): 13 min  Past Medical History:  Past Medical History  Diagnosis Date  . Heart murmur, systolic 08/10/2009  . Syncope 05/07/2009  . 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 Esophageal varices 11/07/2008  . Gastric ulcer 10/09    antral, with h pylori positive  . Congestive heart failure 03/06/2008  . Cellulitis and abscess of leg, except foot 03/06/2008  . Secondary hyperparathyroidism 02/02/2008  . Mute 02/02/2008  . Hyperlipidemia 02/02/2008  . Anemia 02/02/2008  . ESRD (end stage renal disease)     TTS hemodialysis  . GERD (gastroesophageal reflux disease)   . Hypertension 02/02/2008    in history  . Mental retardation 02/02/2008  . Mute   . Seizures     "non in a while at home". none in past year .  Marland Kitchen Complication of anesthesia     in the past BP has dropped   . Sepsis due to Pseudomonas species 05/05/2014   Past Surgical History:  Past Surgical History  Procedure Laterality Date  . Left forearm graft      for HD  . Arteriovenous graft placement  11/22/10    Right thigh AVG  . Thrombectomy and revision of arterioventous (av) goretex  graft    . Thrombectomy and revision of arterioventous (av) goretex  graft  10/22/2012    Procedure: THROMBECTOMY AND REVISION OF ARTERIOVENTOUS (AV) GORETEX  GRAFT;  Surgeon: Larina Earthly, MD;  Location: [facility] OR;  Service: Vascular;  Laterality: Right;  . Thrombectomy w/ embolectomy  11/10/2012    Procedure: THROMBECTOMY ARTERIOVENOUS GORE-TEX GRAFT;  Surgeon: Pryor Ochoa, MD;  Location: [facility] OR;  Service: Vascular;  Laterality: Right;  . Thrombectomy and revision of arterioventous (av) goretex  graft Right 12/08/2012    Procedure: THROMBECTOMY AND REVISION OF ARTERIOVENTOUS (AV) GORETEX  GRAFT right thigh;  Surgeon: Sherren Kerns, MD;   Location: [facility] OR;  Service: Vascular;  Laterality: Right;  Susie Cassette N/A 12/08/2012    Procedure: VENOGRAM;  Surgeon: Sherren Kerns, MD;  Location: [facility] OR;  Service: Vascular;  Laterality: N/A;  Intraoperative Central venogram  . Thrombectomy w/ embolectomy Right 12/12/2012    Procedure: THROMBECTOMY ARTERIOVENOUS GORE-TEX GRAFT;  Surgeon: Nada Libman, MD;  Location: [facility] OR;  Service: Vascular;  Laterality: Right;  . Insertion of dialysis catheter Left 12/14/2012    Procedure: INSERTION OF DIALYSIS CATHETER;  Surgeon: Nada Libman, MD;  Location: [facility] OR;  Service: Vascular;  Laterality: Left;  . Insertion of dialysis catheter Right 01/13/2013    Procedure: INSERTION OF DIALYSIS CATHETER;  Surgeon: Nada Libman, MD;  Location: [facility] OR;  Service: Vascular;  Laterality: Right;  . Removal of a dialysis catheter Left 01/13/2013    Procedure: REMOVAL OF A DIALYSIS CATHETER;  Surgeon: Nada Libman, MD;  Location: [facility] OR;  Service: Vascular;  Laterality: Left;  . Av fistula placement Left 02/11/2013    Procedure: INSERTION OF ARTERIOVENOUS (AV) GORE-TEX GRAFT THIGH;  Surgeon: Nada Libman, MD;  Location: [facility] OR;  Service: Vascular;  Laterality: Left;  using 6mm x 50cm Gore-Tex Vascular Graft  . Esophagogastroduodenoscopy N/A 02/16/2013    Procedure: ESOPHAGOGASTRODUODENOSCOPY (EGD);  Surgeon: Vertell Novak., MD;  Location: [facility] ENDOSCOPY;  Service: Endoscopy;  Laterality: N/A;  bedside  . Esophagogastroduodenoscopy N/A 09/14/2013    Procedure: ESOPHAGOGASTRODUODENOSCOPY (EGD);  Surgeon: Vertell Novak., MD;  Location: [facility] ENDOSCOPY;  Service: Endoscopy;  Laterality: N/A;  control of bleeding if needed  . Esophagogastroduodenoscopy N/A 10/03/2013    Procedure: ESOPHAGOGASTRODUODENOSCOPY (EGD);  Surgeon: Theda Belfast, MD;  Location: [facility] ENDOSCOPY;  Service: Endoscopy;  Laterality: N/A;  Bedside  . Radiology with anesthesia Right 10/19/2013    Procedure: RADIOLOGY WITH ANESTHESIA;  Surgeon:  Malachy Moan, MD;  Location: [facility] OR;  Service: Radiology;  Laterality: Right;  . Radiology with anesthesia Left 12/28/2013    Procedure: RADIOLOGY WITH ANESTHESIA;  Surgeon: Reola Calkins, MD;  Location: [facility] OR;  Service: Radiology;  Laterality: Left;  . Thrombectomy and revision of arterioventous (av) goretex  graft Left 01/25/2014    Procedure: THROMBECTOMY AND REVISION OF LEFT THIGH ARTERIOVENTOUS (AV) GORETEX  GRAFT WITH PATCH ANGIOPLASTY;  Surgeon: Pryor Ochoa, MD;  Location: [facility] OR;  Service: Vascular;  Laterality: Left;  . Thrombectomy and revision of arterioventous (av) goretex  graft Left 04/10/2014    Procedure: THROMBECTOMY AND REVISION OF ARTERIOVENTOUS (AV) GORETEX  GRAFT;  Surgeon: Sherren Kerns, MD;  Location: [facility] OR;  Service: Vascular;  Laterality: Left;  . Patch angioplasty Left 04/10/2014    Procedure: PATCH ANGIOPLASTY LEFT SFA;  Surgeon: Sherren Kerns, MD;  Location: [facility] OR;  Service: Vascular;  Laterality: Left;  . Insertion of dialysis catheter Bilateral 04/11/2014    Procedure: INSERTION OF DIALYSIS CATHETER RIGHT FEMORAL VEIN; INSERTION OF TRIPLE LUMEN LEFT FEMORAL VEIN CENTRAL LINE; REMOVAL OF DIALYSIS CATHETER IN RIGHT FEMORAL VEIN.;  Surgeon: Nada Libman, MD;  Location: [facility] OR;  Service: Vascular;  Laterality: Bilateral;  . Exchange of a dialysis catheter Right 04/29/2014    Procedure: EXCHANGE OF A  FEMORAL DIALYSIS CATHETER;  Surgeon: Pryor Ochoa, MD;  Location: [facility] OR;  Service: Vascular;  Laterality: Right;  . Insertion of dialysis catheter Right 05/09/2014    Procedure: INSERTION OF DIALYSIS CATHETER- FEMORAL;  Surgeon: Fransisco Hertz, MD;  Location: [facility] OR;  Service: Vascular;  Laterality: Right;  . Radiology with anesthesia N/A 05/11/2014    Procedure: RADIOLOGY WITH ANESTHESIA;  Surgeon: Durwin Glaze III, MD;  Location: [facility] OR;  Service: Radiology;  Laterality: N/A;   HPI:  [name] is a 64 year old man with non-verbal mental retardation, ESRD on  T/Th/Sa, acquired adrenal insufficiency, chronic hypotension, anemia, and history of seizures who presents with agitation. History was obtained from ED physician and EMR as his caregiver Lavell Anchors) had gone home for the night at the time of the exam. He attended and completed HD today and was noted to be combative and incontinent of stool and reportedly urine although he is anuric. When asked about pain by the ED physician, Mr Kaden repeatedly placed his hands on his mid-abdomen. Caregiver reported his diarrhea today but denied any nausea. He had no other bowel movements while in the ED. His caretaker said that since 10/24/14 he has been irritable and will try to push him away which is typical behavior when he is not feeling well. Orders received for bedside swallow evaluation. This patient is known to our services from previous admission; however, patient's unwillingness to eat/complete bedside with clinician ultimately resulted in patient being discharged due to refusal for 3 consecutive attempts.      Assessment / Plan / Recommendation Clinical Impression  Patient demonstrates cognitive impairment impacting willingness to accept PO from familiar and unfamiliar caregivers at this time, which typically happens when patient is sick  per caregiver report.  Trials were limited but patient demonstrated no overt s/s of aspiration with pureed consistencies.  Due to refusal to accept thin liquids or solid textures SLP recommendations are limited to what was observed at this time; Dys. 1 textures.  However, defer ultimate diet order to MD at this time.      Aspiration Risk   (unknown at this time)    Diet Recommendation Dysphagia 1 (Puree)   Liquid Administration via:  (no liquids can be recommended at this time) Medication Administration: Crushed with puree Supervision: Staff to assist with self feeding;Full supervision/cueing for compensatory strategies Compensations: Slow rate;Small sips/bites Postural  Changes and/or Swallow Maneuvers: Seated upright 90 degrees;Upright 30-60 min after meal    Other  Recommendations Oral Care Recommendations: Oral care BID   Follow Up Recommendations  Other (comment) (TBD)    Frequency and Duration min 2x/week  1 week   Pertinent Vitals/Pain Temp in last 24 hours    SLP Swallow Goals  See Care Plan    Swallow Study Prior Functional Status  Care giver reports patient was consuming regular textures and thin liquids PTA.     General HPI: Ciro Avallone is a 64 year old man with non-verbal mental retardation, ESRD on T/Th/Sa, acquired adrenal insufficiency, chronic hypotension, anemia, and history of seizures who presents with agitation. History was obtained from ED physician and EMR as his caregiver Lavell Anchors) had gone home for the night at the time of the exam. He attended and completed HD today and was noted to be combative and incontinent of stool and reportedly urine although he is anuric. When asked about pain by the ED physician, Mr Leavelle repeatedly placed his hands on his mid-abdomen. Caregiver reported his diarrhea today but denied any nausea. He had no other bowel movements while in the ED. His caretaker said that since 10/24/14 he has been irritable and will try to push him away which is typical behavior when he is not feeling well. Orders received for bedside swallow evaluation. This patient is known to our services from previous admission; however, patient's unwillingness to eat/complete bedside with clinician ultimately resulted in patient being discharged due to refusal for 3 consecutive attempts.    Type of Study: Bedside swallow evaluation Diet Prior to this Study: NPO Temperature Spikes Noted: Yes Respiratory Status: Room air History of Recent Intubation: No Behavior/Cognition: Alert;Requires cueing;Doesn't follow directions;Uncooperative Oral Cavity - Dentition: Missing dentition Self-Feeding Abilities: Needs assist Patient Positioning:  Upright in bed Baseline Vocal Quality: Other (comment) (mute) Volitional Cough: Cognitively unable to elicit Volitional Swallow: Unable to elicit    Oral/Motor/Sensory Function Overall Oral Motor/Sensory Function: Other (comment) (unable to assess)   Ice Chips Ice chips: Not tested   Thin Liquid Thin Liquid:  (unable to assess)    Nectar Thick Nectar Thick Liquid: Not tested   Honey Thick Honey Thick Liquid: Not tested   Puree Puree: Within functional limits Presentation: Spoon   Solid   GO    Solid:  (unable to assess)       [name], M.A., CCC-SLP [phone]  Neeley Sedivy 10/27/2014,1:46 PM

## 2014-10-27 NOTE — Progress Notes (Addendum)
Unable to retrieve STAT labs and CT of head. Pt is irritable with staff members and withdraws arm aggressively. Cyndia DiverJ. Osman MD made aware. Will continue to monitor. Gilman Schmidtembrina, Glover Capano J

## 2014-10-27 NOTE — Progress Notes (Signed)
SLP Cancellation Note  Patient Details Name: [name] MRN: [id number] DOB: 1951/01/13   Cancelled treatment:       Reason Eval/Treat Not Completed: Patient declined, no reason specified.  RN informed SLP that caregiver would be present at 0930 in hopes of getting patient to participate in evaluation; however not here.  Will attempt later as able.   Fae PippinMelissa Doc Mandala, M.A., CCC-SLP [phone]   [name] 10/27/2014, 9:41 AM

## 2014-10-27 NOTE — Progress Notes (Signed)
EEG completed, results pending. 

## 2014-10-27 NOTE — Progress Notes (Signed)
MEDICATION RELATED CONSULT NOTE - INITIAL   Pharmacy Consult for Phenytoin  Indication: Seizures   No Known Allergies  Patient Measurements: 57.1 kg  Vital Signs: Temp: 98.8 F (37.1 C) (01/07 1804) Temp Source: Rectal (01/07 1804) BP: 96/61 mmHg (01/08 0015) Pulse Rate: 96 (01/08 0000)  Labs:  Recent Labs  10/26/14 1759  WBC 9.4  HGB 8.9*  HCT 29.0*  PLT 163  CREATININE 4.36*   Medical History: Past Medical History  Diagnosis Date  . Heart murmur, systolic 08/10/2009  . Syncope 05/07/2009  . 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 Esophageal varices 11/07/2008  . Gastric ulcer 10/09    antral, with h pylori positive  . Congestive heart failure 03/06/2008  . Cellulitis and abscess of leg, except foot 03/06/2008  . Secondary hyperparathyroidism 02/02/2008  . Mute 02/02/2008  . Hyperlipidemia 02/02/2008  . Anemia 02/02/2008  . ESRD (end stage renal disease)     TTS hemodialysis  . GERD (gastroesophageal reflux disease)   . Hypertension 02/02/2008    in history  . Mental retardation 02/02/2008  . Mute   . Seizures     "non in a while at home". none in past year .  Marland Kitchen. Complication of anesthesia     in the past BP has dropped   . Sepsis due to Pseudomonas species 05/05/2014    Assessment: Pt from HD with AMS/fever, pt lives at a group home, pt is on phenytoin therapy prior to admission (200 mg ER capsule daily at hs). Phenytoin level in the ED is undetectable.   Goal of Therapy:  Phenytoin level 10-20 mg/L  Plan:  -Re-load with phenytoin 1000 mg IV ONCE -Increase phenytoin to 250 mg ER daily at hs -Re-check phenytoin level/albumin in ~5 days -If discharged before 5 days, would recommend level check before discharge or shortly after discharge with PCP   Abran DukeLedford, Damiano Stamper 10/27/2014,2:02 AM

## 2014-10-27 NOTE — Progress Notes (Signed)
ANTIBIOTIC CONSULT NOTE - INITIAL  Pharmacy Consult for Vancomycin/Zosyn  Indication: rule out sepsis  No Known Allergies  Patient Measurements: Height: 5' (152.4 cm) Weight: 125 lb 14.1 oz (57.1 kg) IBW/kg (Calculated) : 50  Vital Signs: Temp: 100.6 F (38.1 C) (01/08 0500) Temp Source: Axillary (01/08 0500) BP: 80/30 mmHg (01/08 0500) Pulse Rate: 105 (01/08 0500)  Labs:  Recent Labs  10/26/14 1759  WBC 9.4  HGB 8.9*  PLT 163  CREATININE 4.36*   Estimated Creatinine Clearance: 11.9 mL/min (by C-G formula based on Cr of 4.36).   Medical History: Past Medical History  Diagnosis Date  . Heart murmur, systolic 08/10/2009  . Syncope 05/07/2009  . 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 Esophageal varices 11/07/2008  . Gastric ulcer 10/09    antral, with h pylori positive  . Congestive heart failure 03/06/2008  . Cellulitis and abscess of leg, except foot 03/06/2008  . Secondary hyperparathyroidism 02/02/2008  . Mute 02/02/2008  . Hyperlipidemia 02/02/2008  . Anemia 02/02/2008  . ESRD (end stage renal disease)     TTS hemodialysis  . GERD (gastroesophageal reflux disease)   . Hypertension 02/02/2008    in history  . Mental retardation 02/02/2008  . Mute   . Seizures     "non in a while at home". none in past year .  Marland Kitchen. Complication of anesthesia     in the past BP has dropped   . Sepsis due to Pseudomonas species 05/05/2014   Assessment: 64 y/o M that lives in a group home who comes to the ED from his dialysis center with AMS/fever/?UTI. WBC WNL, lactic acid 2.3, HD on TTS.   Goal of Therapy:  Pre-HD vancomycin level 15-25 mg/L  Plan:  -Vancomycin 1250 mg IV x 1, then 500 mg qHD TTS -Zosyn 2.25g IV q8h -Trend WBC, temp, HD schedule -Drug levels as indicated   Abran DukeLedford, Duward Allbritton 10/27/2014,6:21 AM

## 2014-10-27 NOTE — Progress Notes (Addendum)
Subjective: This AM, his caregiver Lavell Anchors is present in the room and reports that he has been agitated over the last several days. I explained that we are doing a workup to rule out anything that may have contributed to his presentation.  Objective: Vital signs in last 24 hours: Filed Vitals:   10/27/14 0300 10/27/14 0500 10/27/14 0900 10/27/14 1003  BP: 92/46 80/30  128/57  Pulse: 100 105  110  Temp: 97.8 F (36.6 C) 100.6 F (38.1 C) 98.4 F (36.9 C)   TempSrc: Oral Axillary Axillary   Resp: 20 18    Height: 5' (1.524 m)     Weight: 125 lb 14.1 oz (57.1 kg)     SpO2: 98% 92%  95%   Weight change:  No intake or output data in the 24 hours ending 10/27/14 1254  Gen: Awake, alert, moving eyes though nonverbal HEENT: PERRL, EOMI though unsure if he is more likely to look to left, no scleral icterus Cardiac: RRR, no rubs, murmurs or gallops Pulm: clear to auscultation in anterior lung fields Abd: soft, nontender, nondistended, BS present, cords present in mid-epigastric & RUQ (veins?) Ext: warm and well perfused, no pedal edema, R femoral HD cath without drainage Neuro: responds to questions appropriately; moving all extremities freely   Lab Results: Basic Metabolic Panel:  Recent Labs Lab 10/26/14 1759 10/27/14 0650  NA 139  --   K 3.6  --   CL 99  --   CO2 27  --   GLUCOSE 101*  --   BUN 12  --   CREATININE 4.36*  --   CALCIUM 8.5  --   MG  --  2.0  PHOS  --  5.8*   CBC:  Recent Labs Lab 10/26/14 1759 10/27/14 0620  WBC 9.4 8.1  NEUTROABS 8.1*  --   HGB 8.9* 8.5*  HCT 29.0* 27.7*  MCV 97.3 97.5  PLT 163 183   Cardiac Enzymes:  Recent Labs Lab 10/27/14 0650  TROPONINI 0.03   Fasting Lipid Panel:  Recent Labs Lab 10/27/14 0650  CHOL 137  HDL 57  LDLCALC 44  TRIG 180*  CHOLHDL 2.4   Thyroid Function Tests:  Recent Labs Lab 10/27/14 0650  TSH 4.382   Coagulation:  Recent Labs Lab 10/27/14 0650  LABPROT 16.9*  INR 1.36      Micro Results: Recent Results (from the past 240 hour(s))  Blood culture (routine x 2)     Status: None (Preliminary result)   Collection Time: 10/26/14  5:59 PM  Result Value Ref Range Status   Specimen Description BLOOD ARM LEFT  Final   Special Requests BOTTLES DRAWN AEROBIC AND ANAEROBIC 5CC  Final   Culture   Final    GRAM POSITIVE COCCI IN CLUSTERS Note: Gram Stain Report Called to,Read Back By and Verified With: Caesar Bookman 10/27/14 1120 BY SMITHERSJ Performed at [facility]    Report Status PENDING  Incomplete  MRSA PCR Screening     Status: Abnormal   Collection Time: 10/27/14  3:23 AM  Result Value Ref Range Status   MRSA by PCR POSITIVE (A) NEGATIVE Final    Comment:        The GeneXpert MRSA Assay (FDA approved for NASAL specimens only), is one component of a comprehensive MRSA colonization surveillance program. It is not intended to diagnose MRSA infection nor to guide or monitor treatment for MRSA infections. RESULT CALLED TO, READ BACK BY AND VERIFIED WITH: CALLED TO RN  Junius Argyle [id number]  THANEY    Studies/Results: Ct Abdomen Pelvis W Contrast  10/26/2014   CLINICAL DATA:  Patient arrives via EMS from dialysis center. Patient was extremely combative had a fever and possible UTI. Generalized abdominal pain.  EXAM: CT ABDOMEN AND PELVIS WITH CONTRAST  TECHNIQUE: Multidetector CT imaging of the abdomen and pelvis was performed using the standard protocol following bolus administration of intravenous contrast.  CONTRAST:  OMNIPAQUE IOHEXOL 300 MG/ML  SOLN  COMPARISON:  CT chest abdomen and pelvis 10/05/2013  FINDINGS: Evaluation of lung bases is limited due to motion artifact. Suggestion of vascular prominence and interstitial changes probably due to edema and fluid overload.  Examination is technically limited due to motion artifact. The liver, spleen, pancreas, adrenal glands, and retroperitoneal lymph nodes appear grossly unremarkable.  Gallbladder is contracted appears to be filled with sludge or possibly small stones. No bile duct dilatation. Multi-cystic appearance of the kidneys, some with calcification and some with increased density suggesting hemorrhage. Appearance is grossly unchanged since prior study. No hydronephrosis. Calcification of the abdominal aorta without aneurysm. Right femoral approach intravenous catheter with tip in the right atrium consistent with dialysis catheter. Stomach, small bowel, and colon appear decompressed. No free air or free fluid in the abdomen. Multiple venous collateral vessels are demonstrated throughout the subcutaneous soft tissues of the abdominal wall bilaterally. Previous study demonstrate sclerosis of the superior vena cava, likely accounting for the collaterals.  Pelvis: Stool in the rectosigmoid colon. No inflammatory changes. Bladder is decompressed. Prostate gland is not significantly enlarged. No free or loculated pelvic fluid collections. There is a fluid collection measuring 4.6 x 5 x 7.2 cm demonstrated in the right groin adjacent to vascular grafts arising from the common femoral artery. This is new since previous study and could represent developing hematoma. Alternatively, infected graft with abscess could also have this appearance. Infiltration in the subcutaneous fat consistent with edema. Mild prominence of lymph nodes in the groin regions bilaterally likely are reactive. With the mild lumbar scoliosis convex towards the left. Degenerative changes in the lumbar spine. No destructive bone lesions appreciated.  IMPRESSION: Multi-cystic appearance of both kidneys, unchanged since prior study. Multiple venous collaterals demonstrated throughout the subcutaneous soft tissues of the abdominal wall consistent with sclerosis of the superior vena cava noted on prior chest CT. Interval development of a fluid collection in the left groin region adjacent to vascular grafts. This could represent  hematoma, pseudoaneurysm, or infection related to the graft. Sludge or small stones in the gallbladder.   Electronically Signed   By: Burman Nieves M.D.   On: 10/26/2014 23:42   Dg Chest Portable 1 View  10/26/2014   CLINICAL DATA:  Altered mental status. Congestive heart failure. Hypertension.  EXAM: PORTABLE CHEST - 1 VIEW  COMPARISON:  05/09/2014  FINDINGS: Right groin tunneled dialysis catheter noted, terminating in the right atrium.  The patient is rotated to the Right on today's radiograph, reducing diagnostic sensitivity and specificity. Low lung volumes. Mild atherosclerotic calcification of the aortic arch.  The lungs appear clear.  IMPRESSION: 1. Low lung volumes.  However, the lungs appear clear. 2. Dialysis catheter tip: Right atrium.   Electronically Signed   By: Herbie Baltimore M.D.   On: 10/26/2014 18:17   Medications: I have reviewed the patient's current medications. Scheduled Meds: . atorvastatin  20 mg Oral QHS  . Chlorhexidine Gluconate Cloth  6 each Topical Q0600  . cinacalcet  30 mg Oral Q breakfast  . [  START ON 11/02/2014] darbepoetin (ARANESP) injection - DIALYSIS  100 mcg Intravenous Q Thu-HD  . [START ON 10/28/2014] doxercalciferol  3 mcg Oral Q T,Th,Sa-HD  . [START ON 10/28/2014] ferric gluconate (FERRLECIT/NULECIT) IV  62.5 mg Intravenous Q T,Th,Sa-HD  . heparin  5,000 Units Subcutaneous 3 times per day  . midodrine  15 mg Oral TID WC  . multivitamin  1 tablet Oral QHS  . mupirocin ointment  1 application Nasal BID  . pantoprazole  40 mg Oral Daily  . phenytoin  250 mg Oral QHS  . piperacillin-tazobactam (ZOSYN)  IV  2.25 g Intravenous 3 times per day  . sevelamer carbonate  800 mg Oral TID WC  . [START ON 10/28/2014] vancomycin  500 mg Intravenous Q T,Th,Sa-HD   Continuous Infusions:  PRN Meds:.acetaminophen **OR** acetaminophen Assessment/Plan: Acute encephalopathy: Likely 2/2 sepsis [source + 2/4 SIRS criteria] as it appears with initial cultures remarkable for  Gram+ cocci and prior history of cath infection. Pro-calcitonin 29.7. Cooperative during bedside exam with the assistance of his caregiver. P elevated at 5.8. Other labs unremarkable: TSH, troponin, head CT. -Abx Day 1: continue vancomycin & Zosyn -Blood cultures from HD cath pending -Check cardio echo to r/o infection -Follow-up EEG -Follow-up HIV -Follow-up Vascular recs  ESRD on HD: T/H/Sa schedule.  -Continue home Sensipar, Renvela, Dialyvite -Nephrology following, appreciate recs  Chronic hypotension: First documented as early as 2010 per chart review. ACTH stimulation and cortisol done back in 2010 were not suggestive though adrenal sufficiency presumed by multiple providers since that time. Also questionable that he is not hyponatremic nor hypokalemic. BP trending mostly 90-130/40-50. Do not think he needs to be stress-dose steroids if he is no evidence of adrenal sufficiency. -Follow-up AM cortisol  -Continue home midodrine   Seizure disorder: Phenytoin level <2.5 on admission though no report of recent seizure activity. -Continue phenytoin per pharmacy  Anemia of chronic disease: Hb 8.9 on admission, 8-9 during most recent hospitalization. Fe panel at that time with Fe 23, ferritin 309.  -Continue assessing  GERD: Continue home Protonix  Hyperlipidemia: Lipid panel notable for TG 180 which of course may have been affected depending on food.  -Continue home statin  Mental retardation: PT recommends 24-hour supervision, OT without further recs.  #FEN:  -Diet: Renal  #DVT prophylaxis: heparin 5000 units subcutaneous  #CODE STATUS: DNR/DNI -Defer to sister Sherryle LisJanie [HCPOA] if patients lacks decision-making capacity -Confirmed with her on admission  Dispo: Disposition is deferred at this time, awaiting improvement of current medical problems.    The patient does have a current PCP Courtney Paris(Eden W Jones, MD) and does need an [facility] hospital follow-up appointment after discharge.  The  patient does have transportation limitations that hinder transportation to clinic appointments.  .Services Needed at time of discharge: Y = Yes, Blank = No PT:   OT:   RN:   Equipment:   Other:     LOS: 1 day   Heywood Ilesushil Alane Hanssen, MD 10/27/2014, 12:54 PM

## 2014-10-27 NOTE — Progress Notes (Addendum)
Pt lethargic this evening, arousable. However, pt quickly falls back asleep. Refused all PO meds. During assessment vitals were blood pressure 56/42, pulse 114, temperature 98.3 F (36.8 C), resp. rate 32, height 5' (1.524 m), SpO2 88 %. Pt given 2lL Jack[place] with sats at 93%. Rosman MD made aware. Orders for 250 ml NS bolus and solumedrol 50 mg to be given. If no improvement in alertness and blood pressures after bolus and solumedrol, per MD will consider transferring to stepdown for closer monitoring. Currently implementing orders. Will continue to monitor. Gilman Schmidtembrina, Dmarius Reeder J

## 2014-10-27 NOTE — Evaluation (Signed)
Occupational Therapy Evaluation Patient Details Name: [name] MRN: [id number] DOB: Jul 26, 1951 Today's Date: 10/27/2014    History of Present Illness Pt admitted after HD with fever, AMS and agitation, recent diarrhea and indication of abdominal pain. 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   Clinical Impression   Pt is able to walk, self feed, dress, and toilet independently at baseline. He is assisted for showering.  Limited evaluation today to EOB sitting.  Pt requiring +2 assist with pt actively resistant, but not agitated. Will follow acutely.      Follow Up Recommendations  Home health OT;Supervision/Assistance - 24 hour (depending on progress)   Equipment Recommendations  None recommended by OT    Recommendations for Other Services       Precautions / Restrictions Precautions Precautions: Fall (contact precautions) Restrictions Weight Bearing Restrictions: No      Mobility Bed Mobility Overal bed mobility: Needs Assistance Bed Mobility: Supine to Sit;Sit to Supine     Supine to sit: +2 for physical assistance;Total assist Sit to supine: Min guard   General bed mobility comments: Pt likely to be able to do more, resistant to bed mobility.  Transfers                      Balance Overall balance assessment: Needs assistance Sitting-balance support: Bilateral upper extremity supported Sitting balance-Leahy Scale: Poor Sitting balance - Comments: sat x 8 minutes, leans L Postural control: Left lateral lean                                  ADL Overall ADL's : Needs assistance/impaired Eating/Feeding: NPO                                     General ADL Comments: Session limited to sitting EOB, required total assist for socks, poor sitting balance for UE use when unsupported.     Vision                     Perception     Praxis      Pertinent  Vitals/Pain Pain Assessment: No/denies pain     Hand Dominance Right   Extremity/Trunk Assessment Upper Extremity Assessment Upper Extremity Assessment: Overall WFL for tasks assessed   Lower Extremity Assessment Lower Extremity Assessment: Defer to PT evaluation   Cervical / Trunk Assessment Cervical / Trunk Assessment: Kyphotic (with forward head in sitting)   Communication Communication Communication: Other (comment) (non verbal, grunts, points, nods head)   Cognition Arousal/Alertness: Awake/alert Behavior During Therapy: Flat affect (initially resistant, pushing therapists away) Overall Cognitive Status: History of cognitive impairments - at baseline                     General Comments       Exercises       Shoulder Instructions      Home Living Family/patient expects to be discharged to:: Private residence Living Arrangements: Non-relatives/Friends Available Help at Discharge: Personal care attendant;Available 24 hours/day Type of Home: House (split level) Home Access: Ramped entrance     Home Layout: Two level;Bed/bath upstairs Alternate Level Stairs-Number of Steps: 10 Alternate Level Stairs-Rails: Right;Left;Can reach both Bathroom Shower/Tub: Chief Strategy Officer:  (both)  Home Equipment: Shower seat   Additional Comments: information gained from caregiver by phone      Prior Functioning/Environment Level of Independence: Needs assistance  Gait / Transfers Assistance Needed: ambulates without device ADL's / Homemaking Assistance Needed: pt can dress, feed and toilet independently, assist for showering Communication / Swallowing Assistance Needed: non verbal      OT Diagnosis: Generalized weakness;Cognitive deficits   OT Problem List: Decreased strength;Decreased activity tolerance;Impaired balance (sitting and/or standing);Decreased cognition;Decreased safety awareness;Decreased knowledge of use of DME or AE;Decreased  knowledge of precautions   OT Treatment/Interventions: Self-care/ADL training;Therapeutic activities;Cognitive remediation/compensation;Patient/family education;Balance training;DME and/or AE instruction    OT Goals(Current goals can be found in the care plan section) Acute Rehab OT Goals Patient Stated Goal: Pt unable to state. OT Goal Formulation: Patient unable to participate in goal setting Time For Goal Achievement: 11/10/14 Potential to Achieve Goals: Fair ADL Goals Pt Will Perform Eating: with set-up;sitting Pt Will Perform Grooming: with supervision;standing Pt Will Perform Upper Body Dressing: with supervision;sitting Pt Will Perform Lower Body Dressing: with supervision;sit to/from stand Pt Will Transfer to Toilet: with supervision;ambulating;regular height toilet Pt Will Perform Toileting - Clothing Manipulation and hygiene: with supervision;sit to/from stand  OT Frequency: Min 2X/week   Barriers to D/C:            Co-evaluation PT/OT/SLP Co-Evaluation/Treatment: Yes Reason for Co-Treatment: Necessary to address cognition/behavior during functional activity;For patient/therapist safety   OT goals addressed during session: ADL's and self-care      End of Session Nurse Communication: Mobility status;Precautions  Activity Tolerance:   Patient left: in bed;with bed alarm set (pt pushing call button away)   Time: 9528-4132 OT Time Calculation (min): 37 min Charges:  OT General Charges $OT Visit: 1 Procedure OT Evaluation $Initial OT Evaluation Tier I: 1 Procedure OT Treatments $Self Care/Home Management : 8-22 mins G-Codes:    Evern Bio 10/27/2014, 3:33 PM  [phone]

## 2014-10-27 NOTE — Progress Notes (Addendum)
New Admission Note  Arrival: via stretcher Mental Orientation: unable to assess, pt only grunts. Baseline. Hx of MR.  Telemetry: n/a Assessment: See flow sheet. IV: L shoulder 20 g saline locked Safety Measures: Side rails in place, fall risk assessment complete with patient unable to verbalize understanding of risks associated with falls d/t mental status.  Call bell within reach, bed in lowest position, non-skid socks applied. [address] Orientation: Pt orientation to unit, room and routine. Information packet given to patient and safety video watched.  Admission armband ID verified with patient and in place.  Family: n/a  Orders have been reviewed and implemented. Pt is mute and is not able to complete admission screening at this time. Will continue to monitor and assist as needed.  Gilman Schmidtembrina, Robina Hamor J, RN 10/27/2014 2:57 AM

## 2014-10-27 NOTE — Consult Note (Signed)
[facility] Renal Consultation Note  Indication for Consultation:  Management of ESRD/hemodialysis; anemia, hypertension/volume and secondary hyperparathyroidism  HPI: [name] is a 64 y.o. male presented yesterday from op HD (sgkc,tts) with reported agitation, AMS, incontinence of stool and urine . He is a poor historian sec to MR/ his caregiver Medical illustrator) in room now reports more lethargy /and agitation at home past few days and co bilateral leg pain. He has a R Fem .perm. Cath (has end stage HD access as noted Dr. Bridgett Larsson 05/09/14 admit) with no current active discharge . Was hypotensive in er given 500 cc fluid bolus bp 82/43 then 96/53 . He is on Midodrine as op . Currently in room ,appears more lethargic than Normal MS with eyes open/ EEG being preformed, and just reported blood cultures pos gram pos cocci.      Past Medical History  Diagnosis Date  . Heart murmur, systolic 28/78/6767  . Syncope 05/07/2009  . 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 Esophageal varices 11/07/2008  . Gastric ulcer 10/09    antral, with h pylori positive  . Congestive heart failure 03/06/2008  . Cellulitis and abscess of leg, except foot 03/06/2008  . Secondary hyperparathyroidism 02/02/2008  . Mute 02/02/2008  . Hyperlipidemia 02/02/2008  . Anemia 02/02/2008  . ESRD (end stage renal disease)     TTS hemodialysis  . GERD (gastroesophageal reflux disease)   . Hypertension 02/02/2008    in history  . Mental retardation 02/02/2008  . Mute   . Seizures     "non in a while at home". none in past year .  Marland Kitchen Complication of anesthesia     in the past BP has dropped   . Sepsis due to Pseudomonas species 05/05/2014    Past Surgical History  Procedure Laterality Date  . Left forearm graft      for HD  . Arteriovenous graft placement  11/22/10    Right thigh AVG  . Thrombectomy and revision of arterioventous (av) goretex  graft    . Thrombectomy and revision of arterioventous (av)  goretex  graft  10/22/2012    Procedure: THROMBECTOMY AND REVISION OF ARTERIOVENTOUS (AV) GORETEX  GRAFT;  Surgeon: Rosetta Posner, MD;  Location: [place];  Service: Vascular;  Laterality: Right;  . Thrombectomy w/ embolectomy  11/10/2012    Procedure: THROMBECTOMY ARTERIOVENOUS GORE-TEX GRAFT;  Surgeon: Mal Misty, MD;  Location: [facility] OR;  Service: Vascular;  Laterality: Right;  . Thrombectomy and revision of arterioventous (av) goretex  graft Right 12/08/2012    Procedure: THROMBECTOMY AND REVISION OF ARTERIOVENTOUS (AV) GORETEX  GRAFT right thigh;  Surgeon: Elam Dutch, MD;  Location: [place];  Service: Vascular;  Laterality: Right;  Annell Greening N/A 12/08/2012    Procedure: VENOGRAM;  Surgeon: Elam Dutch, MD;  Location: [facility] OR;  Service: Vascular;  Laterality: N/A;  Intraoperative Central venogram  . Thrombectomy w/ embolectomy Right 12/12/2012    Procedure: THROMBECTOMY ARTERIOVENOUS GORE-TEX GRAFT;  Surgeon: Serafina Mitchell, MD;  Location: [place];  Service: Vascular;  Laterality: Right;  . Insertion of dialysis catheter Left 12/14/2012    Procedure: INSERTION OF DIALYSIS CATHETER;  Surgeon: Serafina Mitchell, MD;  Location: [place];  Service: Vascular;  Laterality: Left;  . Insertion of dialysis catheter Right 01/13/2013    Procedure: INSERTION OF DIALYSIS CATHETER;  Surgeon: Serafina Mitchell, MD;  Location: [place];  Service: Vascular;  Laterality: Right;  . Removal of a dialysis catheter  Left 01/13/2013    Procedure: REMOVAL OF A DIALYSIS CATHETER;  Surgeon: Serafina Mitchell, MD;  Location: [place];  Service: Vascular;  Laterality: Left;  . Av fistula placement Left 02/11/2013    Procedure: INSERTION OF ARTERIOVENOUS (AV) GORE-TEX GRAFT THIGH;  Surgeon: Serafina Mitchell, MD;  [facility]: [facility];  Service: Vascular;  Laterality: Left;  using 77m x 50cm Gore-Tex Vascular Graft  . Esophagogastroduodenoscopy N/A 02/16/2013    Procedure: ESOPHAGOGASTRODUODENOSCOPY (EGD);  Surgeon: JWinfield Cunas, MD;  [facility]  Service: Endoscopy;  Laterality: N/A;  bedside  . Esophagogastroduodenoscopy N/A 09/14/2013    Procedure: ESOPHAGOGASTRODUODENOSCOPY (EGD);  Surgeon: JWinfield Cunas, MD;  [facility]  Service: Endoscopy;  Laterality: N/A;  control of bleeding if needed  . Esophagogastroduodenoscopy N/A 10/03/2013    Procedure: ESOPHAGOGASTRODUODENOSCOPY (EGD);  Surgeon: PBeryle Beams MD;  [facility]  Service: Endoscopy;  Laterality: N/A;  Bedside  . Radiology with anesthesia Right 10/19/2013    Procedure: RADIOLOGY WITH ANESTHESIA;  Surgeon: HJacqulynn Cadet MD;  [place]  Service: Radiology;  Laterality: Right;  . Radiology with anesthesia Left 12/28/2013    Procedure: RADIOLOGY WITH ANESTHESIA;  Surgeon: GAzzie Roup MD;  [place]  Service: Radiology;  Laterality: Left;  . Thrombectomy and revision of arterioventous (av) goretex  graft Left 01/25/2014    Procedure: THROMBECTOMY AND REVISION OF LEFT THIGH ARTERIOVENTOUS (AV) GORETEX  GRAFT WITH PATCH ANGIOPLASTY;  Surgeon: JMal Misty MD;  [place]  Service: Vascular;  Laterality: Left;  . Thrombectomy and revision of arterioventous (av) goretex  graft Left 04/10/2014    Procedure: THROMBECTOMY AND REVISION OF ARTERIOVENTOUS (AV) GORETEX  GRAFT;  Surgeon: CElam Dutch MD;  [place]  Service: Vascular;  Laterality: Left;  . Patch angioplasty Left 04/10/2014    Procedure: PATCH ANGIOPLASTY LEFT SFA;  Surgeon: CElam Dutch MD;  [place]  Service: Vascular;  Laterality: Left;  . Insertion of dialysis catheter Bilateral 04/11/2014    Procedure: INSERTION OF DIALYSIS CATHETER RIGHT FEMORAL VEIN; INSERTION OF TRIPLE LUMEN LEFT FEMORAL VEIN CENTRAL LINE; REMOVAL OF DIALYSIS CATHETER IN RIGHT FEMORAL VEIN.;  Surgeon: VSerafina Mitchell MD;  [facility];  Service: Vascular;  Laterality: Bilateral;  . Exchange of a dialysis catheter Right 04/29/2014    Procedure: EXCHANGE OF  A  FEMORAL DIALYSIS CATHETER;  Surgeon: JMal Misty MD;  [place]  Service: Vascular;  Laterality: Right;  . Insertion of dialysis catheter Right 05/09/2014    Procedure: INSERTION OF DIALYSIS CATHETER- FEMORAL;  Surgeon: BConrad [place] MD;  [place]  Service: Vascular;  Laterality: Right;  . Radiology with anesthesia N/A 05/11/2014    Procedure: RADIOLOGY WITH ANESTHESIA;  Surgeon: DRickard RhymesIII, MD;  [place]  Service: Radiology;  Laterality: N/A;     History reviewed. No pertinent family history.    reports that he has never smoked. He has never used smokeless tobacco. He reports that he does not drink alcohol or use illicit drugs.  No Known Allergies  Prior to Admission medications   Medication Sig Start Date End Date Taking? Authorizing Provider  atorvastatin (LIPITOR) 20 MG tablet Take 1 tablet (20 mg total) by mouth at bedtime.   Yes ECorky Sox MD  cinacalcet (SENSIPAR) 30 MG tablet Take 1 tablet (30 mg total) by mouth daily with breakfast. 06/19/14  Yes DJulious Oka MD  clonazePAM (Bobbye [place]  0.5 MG tablet Take 0.5 tablets (0.25 mg total) by mouth 2 (two) times daily. 06/19/14  Yes Julious Oka, MD  folic acid-vitamin b complex-vitamin c-selenium-zinc (DIALYVITE) 3 MG TABS tablet Take 1 tablet by mouth daily.   Yes Historical Provider, MD  midodrine (PROAMATINE) 10 MG tablet Take 1.5 tablets (15 mg total) by mouth 3 (three) times daily. 06/21/14  Yes Julious Oka, MD  pantoprazole (PROTONIX) 40 MG tablet Take 1 tablet (40 mg total) by mouth daily. 06/19/14  Yes Julious Oka, MD  phenytoin (DILANTIN) 100 MG ER capsule Take 200 mg by mouth at bedtime.   Yes Historical Provider, MD  sevelamer carbonate (RENVELA) 800 MG tablet Take 800 mg by mouth 3 (three) times daily with meals.   Yes Historical Provider, MD     Anti-infectives    Start     Dose/Rate Route Frequency Ordered Stop   10/28/14 1200  vancomycin (VANCOCIN) 500 mg in sodium chloride 0.9 %  100 mL IVPB     500 mg100 mL/hr over 60 Minutes Intravenous Every T-Th-Sa (Hemodialysis) 10/27/14 0625     10/27/14 0630  vancomycin (VANCOCIN) 1,250 mg in sodium chloride 0.9 % 250 mL IVPB     1,250 mg166.7 mL/hr over 90 Minutes Intravenous  Once 10/27/14 0625 10/27/14 0944   10/27/14 0630  piperacillin-tazobactam (ZOSYN) IVPB 2.25 g     2.25 g100 mL/hr over 30 Minutes Intravenous 3 times per day 10/27/14 0625        Results for orders placed or performed during the hospital encounter of 10/26/14 (from the past 48 hour(s))  Lactic acid, plasma     Status: Abnormal   Collection Time: 10/26/14  5:59 PM  Result Value Ref Range   Lactic Acid, Venous 2.3 (H) 0.5 - 2.2 mmol/L  CBC with Differential     Status: Abnormal   Collection Time: 10/26/14  5:59 PM  Result Value Ref Range   WBC 9.4 4.0 - 10.5 K/uL   RBC 2.98 (L) 4.22 - 5.81 MIL/uL   Hemoglobin 8.9 (L) 13.0 - 17.0 g/dL   HCT 29.0 (L) 39.0 - 52.0 %   MCV 97.3 78.0 - 100.0 fL   MCH 29.9 26.0 - 34.0 pg   MCHC 30.7 30.0 - 36.0 g/dL   RDW 17.9 (H) 11.5 - 15.5 %   Platelets 163 150 - 400 K/uL   Neutrophils Relative % 87 (H) 43 - 77 %   Neutro Abs 8.1 (H) 1.7 - 7.7 K/uL   Lymphocytes Relative 6 (L) 12 - 46 %   Lymphs Abs 0.6 (L) 0.7 - 4.0 K/uL   Monocytes Relative 7 3 - 12 %   Monocytes Absolute 0.7 0.1 - 1.0 K/uL   Eosinophils Relative 0 0 - 5 %   Eosinophils Absolute 0.0 0.0 - 0.7 K/uL   Basophils Relative 0 0 - 1 %   Basophils Absolute 0.0 0.0 - 0.1 K/uL  Basic metabolic panel     Status: Abnormal   Collection Time: 10/26/14  5:59 PM  Result Value Ref Range   Sodium 139 135 - 145 mmol/L    Comment: Please note change in reference range.   Potassium 3.6 3.5 - 5.1 mmol/L    Comment: Please note change in reference range.   Chloride 99 96 - 112 mEq/L   CO2 27 19 - 32 mmol/L   Glucose, Bld 101 (H) 70 - 99 mg/dL   BUN 12 6 - 23 mg/dL   Creatinine, Ser 4.36 (H) 0.50 -  1.35 mg/dL   Calcium 8.5 8.4 - 10.5 mg/dL   GFR calc non Af  Amer 13 (L) >90 mL/min   GFR calc Af Amer 15 (L) >90 mL/min    Comment: (NOTE) The eGFR has been calculated using the CKD EPI equation. This calculation has not been validated in all clinical situations. eGFR's persistently <90 mL/min signify possible Chronic Kidney Disease.    Anion gap 13 5 - 15  Blood culture (routine x 2)     Status: None (Preliminary result)   Collection Time: 10/26/14  5:59 PM  Result Value Ref Range   Specimen Description BLOOD ARM LEFT    Special Requests BOTTLES DRAWN AEROBIC AND ANAEROBIC 5CC    Culture      GRAM POSITIVE COCCI IN CLUSTERS Note: Gram Stain Report Called to,Read Back By and Verified With: Remus Blake 10/27/14 1120 BY SMITHERSJ Performed at [facility]    Report Status PENDING   Phenytoin level, total     Status: Abnormal   Collection Time: 10/26/14  5:59 PM  Result Value Ref Range   Phenytoin Lvl <2.5 (L) 10.0 - 20.0 ug/mL  MRSA PCR Screening     Status: Abnormal   Collection Time: 10/27/14  3:23 AM  Result Value Ref Range   MRSA by PCR POSITIVE (A) NEGATIVE    Comment:        The GeneXpert MRSA Assay (FDA approved for NASAL specimens only), is one component of a comprehensive MRSA colonization surveillance program. It is not intended to diagnose MRSA infection nor to guide or monitor treatment for MRSA infections. RESULT CALLED TO, READ BACK BY AND VERIFIED WITH: CALLED TO RN Debbora Dus [id number] @0600  THANEY   Influenza panel by PCR (type A & B, H1N1)     Status: None   Collection Time: 10/27/14  3:48 AM  Result Value Ref Range   Influenza A By PCR NEGATIVE NEGATIVE   Influenza B By PCR NEGATIVE NEGATIVE   H1N1 flu by pcr NOT DETECTED NOT DETECTED    Comment:        The Xpert Flu assay (FDA approved for nasal aspirates or washes and nasopharyngeal swab specimens), is intended as an aid in the diagnosis of influenza and should not be used as a sole basis for treatment.   CBC     Status: Abnormal    Collection Time: 10/27/14  6:20 AM  Result Value Ref Range   WBC 8.1 4.0 - 10.5 K/uL   RBC 2.84 (L) 4.22 - 5.81 MIL/uL   Hemoglobin 8.5 (L) 13.0 - 17.0 g/dL   HCT 27.7 (L) 39.0 - 52.0 %   MCV 97.5 78.0 - 100.0 fL   MCH 29.9 26.0 - 34.0 pg   MCHC 30.7 30.0 - 36.0 g/dL   RDW 17.9 (H) 11.5 - 15.5 %   Platelets 183 150 - 400 K/uL  TSH     Status: None   Collection Time: 10/27/14  6:50 AM  Result Value Ref Range   TSH 4.382 0.350 - 4.500 uIU/mL  Troponin I     Status: None   Collection Time: 10/27/14  6:50 AM  Result Value Ref Range   Troponin I 0.03 <0.031 ng/mL    Comment:        NO INDICATION OF MYOCARDIAL INJURY. Please note change in reference range.   Lactic acid, plasma     Status: None   Collection Time: 10/27/14  6:50 AM  Result Value Ref Range  Lactic Acid, Venous 1.6 0.5 - 2.2 mmol/L  Magnesium     Status: None   Collection Time: 10/27/14  6:50 AM  Result Value Ref Range   Magnesium 2.0 1.5 - 2.5 mg/dL  Phosphorus     Status: Abnormal   Collection Time: 10/27/14  6:50 AM  Result Value Ref Range   Phosphorus 5.8 (H) 2.3 - 4.6 mg/dL  APTT     Status: Abnormal   Collection Time: 10/27/14  6:50 AM  Result Value Ref Range   aPTT 42 (H) 24 - 37 seconds    Comment:        IF BASELINE aPTT IS ELEVATED, SUGGEST PATIENT RISK ASSESSMENT BE USED TO DETERMINE APPROPRIATE ANTICOAGULANT THERAPY.   Protime-INR     Status: Abnormal   Collection Time: 10/27/14  6:50 AM  Result Value Ref Range   Prothrombin Time 16.9 (H) 11.6 - 15.2 seconds   INR 1.36 0.00 - 1.49  Lipid panel     Status: Abnormal   Collection Time: 10/27/14  6:50 AM  Result Value Ref Range   Cholesterol 137 0 - 200 mg/dL   Triglycerides 180 (H) <150 mg/dL   HDL 57 >39 mg/dL   Total CHOL/HDL Ratio 2.4 RATIO   VLDL 36 0 - 40 mg/dL   LDL Cholesterol 44 0 - 99 mg/dL    Comment:        Total Cholesterol/HDL:CHD Risk Coronary Heart Disease Risk Table                     Men   Women  1/2 Average Risk    3.4   3.3  Average Risk       5.0   4.4  2 X Average Risk   9.6   7.1  3 X Average Risk  23.4   11.0        Use the calculated Patient Ratio above and the CHD Risk Table to determine the patient's CHD Risk.        ATP III CLASSIFICATION (LDL):  <100     mg/dL   Optimal  100-129  mg/dL   Near or Above                    Optimal  130-159  mg/dL   Borderline  160-189  mg/dL   High  >190     mg/dL   Very High   Procalcitonin     Status: None   Collection Time: 10/27/14  6:50 AM  Result Value Ref Range   Procalcitonin 29.74 ng/mL    Comment:        Interpretation: PCT >= 10 ng/mL: Important systemic inflammatory response, almost exclusively due to severe bacterial sepsis or septic shock. (NOTE)         ICU PCT Algorithm               Non ICU PCT Algorithm    ----------------------------     ------------------------------         PCT < 0.25 ng/mL                 PCT < 0.1 ng/mL     Stopping of antibiotics            Stopping of antibiotics       strongly encouraged.               strongly encouraged.    ----------------------------     ------------------------------  PCT level decrease by               PCT < 0.25 ng/mL       >= 80% from peak PCT       OR PCT 0.25 - 0.5 ng/mL          Stopping of antibiotics                                             encouraged.     Stopping of antibiotics           encouraged.    ----------------------------     ------------------------------       PCT level decrease by              PCT >= 0.25 ng/mL       < 80% from peak PCT        AND PCT >= 0.5 ng/mL             Continuing antibiotics                                              encouraged.       Continuing antibiotics            encouraged.    ----------------------------     ------------------------------     PCT level increase compared          PCT > 0.5 ng/mL         with peak PCT AND          PCT >= 0.5 ng/mL             Escalation of antibiotics                                           strongly encouraged.      Escalation of antibiotics        strongly encouraged.      ROS:  See hpi for pos.  (pt poor Historian sec to MR)  Physical Exam: Filed Vitals:   10/27/14 0900  BP:   Pulse:   Temp: 98.4 F (36.9 C)  Resp:      General: alert, noncommunicating, eyes open, NAD HEENT: [place] , MMdry, nonicteric Neck: no jvd Heart: RRR, no mur, gallop, or rub Lungs: CTA but porr resp effort Abdomen: BS pos., soft , NT, ND Extremities: no pedal edema Skin:  No overt rash, no pedal ulcers  Neuro: HO MR and noncommunicating at baseline except for grunting sounds/ no t following requests will at baseline Dialysis Access: R Fem perm cath with  Old/dry scaling skin at site no dc or pain at site/   Dialysis Orders: Center: [facility]  on TTS . EDW 57kg 2.o 2.oca  HD   Time 3hr 20mn Heparin 3500. Access r fem  Perm cath/ Hectorol  3      mcg IV/HD  Aranesp 1030m qweeklyUnits IV/HD  Venofer   10046mload stop 11/07/14     Assessment/Plan 1. Sepsis with gm pos. Cocci bld cult. (etiology prob sec to Recurrent permcath ) on  Iv antibio.  2. ESRD -  HD TTS k 3.6 3. Hypertension/volume  - bp low in er stable now  123/ last reading on floor  On  Midodrine / vol okay / no uf with hd 4. Anemia  - hgb 8.5 on esa/ fe 5. Metabolic bone disease -  Ca 8.5 phos 5.8  continue binders( Renvela) and vit d and sensipar 6. End stage HD Access- Need to dw POA situation with recurrent infections/ and stand of living 7. Hypotension on Midodrine 57m tid / Not on Home po steroids/ admit team rx 8.  HO sx  Disorder- per admit eeg 9. MR-   DErnest Haber PA-C [facility] Beeper [phone]/05/2015, 11:23 AM  I have seen and examined this patient and agree with [facility]  [id number] admitted earlier today with change in MS.  BC + for Gm + cocci.  The exit site of catheter is indurated and probably infected as well but per [facility] notes this is last site as Lt fem vein  occluded.  I spoke with with his POA, his sister, and let her know that this is difficult problem and only recourse now is antibiotics but without removal and placement in another position this may not be enough.  Will at least confirm that Lt fem vein is occluded with simple doppler [place]  If truly occluded then would give prolonged course of AB a try. Myesha Stillion T,MD 10/27/2014 12:46 PM

## 2014-10-27 NOTE — H&P (Signed)
Date: 10/27/2014               Patient Name:  [name] MRN: [id number]  DOB: July 10, 1951 Age / Sex: 64 y.o., male   PCP: Courtney Paris, MD         Medical Service: Internal Medicine Teaching Service         Attending Physician: Dr. Inez Catalina, MD    First Contact: Dr. Heywood Iles Pager: [phone]  Second Contact: Dr. Inocente Salles Pager: [phone]       After Hours (After 5p/  First Contact Pager: [phone]  weekends / holidays): Second Contact Pager: [phone]   Chief Complaint: agitation  History of Present Illness: [name] is a 64 year old man with non-verbal mental retardation, ESRD on T/Th/Sa, acquired adrenal insufficiency, chronic hypotension, anemia, and history of seizures who presents with agitation. History was obtained from ED physician and EMR as his caregiver Lavell Anchors) had gone home for the night at the time of the exam. He attended and completed HD today and was noted to be combative and incontinent of stool and reportedly urine although he is anuric. When asked about pain by the ED physician, [name] repeatedly placed his hands on his mid-abdomen. Caregiver reported his diarrhea today but denied any nausea. He had no other bowel movements while in the ED. His caretaker said that since 10/24/14 he has been irritable and will try to push him away which is typical behavior when he is not feeling well.  During our interview, he was sleeping but arousable. I asked if he remembered me from this summer and he nodded yes. We asked if he was in pain several times and he did not provide any response. He did not respond to any other questions.  In the ED, he received 500 cc NS bolus, solucortef (hydrocortisone 100 mg iv once). When discussing case with the ED physician, ED physician called contact number which went to voicemail.  Of note, [name] was admitted in 04/2014. 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 He had a left thigh AV graft  placed in 01/2013 and a revision in 04/2014 with a hemorrhagic complication as an artery was nicked during a thrombolysis procedure. A R HD catheter was placed which is his only remaining access site. He then developed and was treated for a Pseudomonas bacteremia likely related to manipulating his catheter.  Meds:   Current Facility-Administered Medications  Medication Dose Route Frequency Provider Last Rate Last Dose  . acetaminophen (TYLENOL) tablet 650 mg  650 mg Oral Q6H PRN Otis Brace, MD       Or  . acetaminophen (TYLENOL) suppository 650 mg  650 mg Rectal Q6H PRN Marjan Rabbani, MD      . atorvastatin (LIPITOR) tablet 20 mg  20 mg Oral QHS Marjan Rabbani, MD      . cinacalcet (SENSIPAR) tablet 30 mg  30 mg Oral Q breakfast Marjan Rabbani, MD      . folic acid-vitamin b complex-vitamin c-selenium-zinc (DIALYVITE) tablet 1 tablet  1 tablet Oral Daily Marjan Rabbani, MD      . heparin injection 5,000 Units  5,000 Units Subcutaneous 3 times per day Otis Brace, MD      . midodrine (PROAMATINE) tablet 15 mg  15 mg Oral TID Marjan Rabbani, MD      . pantoprazole (PROTONIX) EC tablet 40 mg  40 mg Oral Daily Otis Brace, MD      .  phenytoin (DILANTIN) 1,000 mg in sodium chloride 0.9 % 250 mL IVPB  1,000 mg Intravenous Once Abran Duke, RPH      . phenytoin (DILANTIN) ER capsule 250 mg  250 mg Oral QHS Abran Duke, RPH      . sevelamer carbonate (RENVELA) tablet 800 mg  800 mg Oral TID WC Otis Brace, MD        Allergies: Allergies as of 10/26/2014  . (No Known Allergies)   Past Medical History  Diagnosis Date  . Heart murmur, systolic 08/10/2009  . Syncope 05/07/2009  . 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 Esophageal varices 11/07/2008  . Gastric ulcer 10/09    antral, with h pylori positive  . Congestive heart failure 03/06/2008  . Cellulitis and abscess of leg, except foot 03/06/2008  . Secondary hyperparathyroidism 02/02/2008  . Mute 02/02/2008  .  Hyperlipidemia 02/02/2008  . Anemia 02/02/2008  . ESRD (end stage renal disease)     TTS hemodialysis  . GERD (gastroesophageal reflux disease)   . Hypertension 02/02/2008    in history  . Mental retardation 02/02/2008  . Mute   . Seizures     "non in a while at home". none in past year .  Marland Kitchen Complication of anesthesia     in the past BP has dropped   . Sepsis due to Pseudomonas species 05/05/2014   Past Surgical History  Procedure Laterality Date  . Left forearm graft      for HD  . Arteriovenous graft placement  11/22/10    Right thigh AVG  . Thrombectomy and revision of arterioventous (av) goretex  graft    . Thrombectomy and revision of arterioventous (av) goretex  graft  10/22/2012    Procedure: THROMBECTOMY AND REVISION OF ARTERIOVENTOUS (AV) GORETEX  GRAFT;  Surgeon: Larina Earthly, MD;  [facility];  Service: Vascular;  Laterality: Right;  . Thrombectomy w/ embolectomy  11/10/2012    Procedure: THROMBECTOMY ARTERIOVENOUS GORE-TEX GRAFT;  Surgeon: Pryor Ochoa, MD;  Location: [facility] OR;  Service: Vascular;  Laterality: Right;  . Thrombectomy and revision of arterioventous (av) goretex  graft Right 12/08/2012    Procedure: THROMBECTOMY AND REVISION OF ARTERIOVENTOUS (AV) GORETEX  GRAFT right thigh;  Surgeon: Sherren Kerns, MD;  Location: [facility] OR;  Service: Vascular;  Laterality: Right;  Susie Cassette N/A 12/08/2012    Procedure: VENOGRAM;  Surgeon: Sherren Kerns, MD;  Location: [facility] OR;  Service: Vascular;  Laterality: N/A;  Intraoperative Central venogram  . Thrombectomy w/ embolectomy Right 12/12/2012    Procedure: THROMBECTOMY ARTERIOVENOUS GORE-TEX GRAFT;  Surgeon: Nada Libman, MD;  Location: [facility] OR;  Service: Vascular;  Laterality: Right;  . Insertion of dialysis catheter Left 12/14/2012    Procedure: INSERTION OF DIALYSIS CATHETER;  Surgeon: Nada Libman, MD;  Location: [facility] OR;  Service: Vascular;  Laterality: Left;  . Insertion of dialysis catheter Right 01/13/2013     Procedure: INSERTION OF DIALYSIS CATHETER;  Surgeon: Nada Libman, MD;  Location: [facility] OR;  Service: Vascular;  Laterality: Right;  . Removal of a dialysis catheter Left 01/13/2013    Procedure: REMOVAL OF A DIALYSIS CATHETER;  Surgeon: Nada Libman, MD;  Location: [facility] OR;  Service: Vascular;  Laterality: Left;  . Av fistula placement Left 02/11/2013    Procedure: INSERTION OF ARTERIOVENOUS (AV) GORE-TEX GRAFT THIGH;  Surgeon: Nada Libman, MD;  Location: [facility] OR;  Service: Vascular;  Laterality: Left;  using 6mm x 50cm Gore-Tex Vascular  Graft  . Esophagogastroduodenoscopy N/A 02/16/2013    Procedure: ESOPHAGOGASTRODUODENOSCOPY (EGD);  Surgeon: Vertell NovakJames L Edwards Jr., MD;  [facility];  Service: Endoscopy;  Laterality: N/A;  bedside  . Esophagogastroduodenoscopy N/A 09/14/2013    Procedure: ESOPHAGOGASTRODUODENOSCOPY (EGD);  Surgeon: Vertell NovakJames L Edwards Jr., MD;  [facility];  Service: Endoscopy;  Laterality: N/A;  control of bleeding if needed  . Esophagogastroduodenoscopy N/A 10/03/2013    Procedure: ESOPHAGOGASTRODUODENOSCOPY (EGD);  Surgeon: Theda BelfastPatrick D Hung, MD;  [facility];  Service: Endoscopy;  Laterality: N/A;  Bedside  . Radiology with anesthesia Right 10/19/2013    Procedure: RADIOLOGY WITH ANESTHESIA;  Surgeon: Malachy MoanHeath McCullough, MD;  [facility];  Service: Radiology;  Laterality: Right;  . Radiology with anesthesia Left 12/28/2013    Procedure: RADIOLOGY WITH ANESTHESIA;  Surgeon: Reola CalkinsGlenn T Yamagata, MD;  [facility];  Service: Radiology;  Laterality: Left;  . Thrombectomy and revision of arterioventous (av) goretex  graft Left 01/25/2014    Procedure: THROMBECTOMY AND REVISION OF LEFT THIGH ARTERIOVENTOUS (AV) GORETEX  GRAFT WITH PATCH ANGIOPLASTY;  Surgeon: Pryor OchoaJames D Lawson, MD;  [facility];  Service: Vascular;  Laterality: Left;  . Thrombectomy and revision of arterioventous (av) goretex  graft Left 04/10/2014    Procedure: THROMBECTOMY AND REVISION OF  ARTERIOVENTOUS (AV) GORETEX  GRAFT;  Surgeon: Sherren Kernsharles E Fields, MD;  [facility];  Service: Vascular;  Laterality: Left;  . Patch angioplasty Left 04/10/2014    Procedure: PATCH ANGIOPLASTY LEFT SFA;  Surgeon: Sherren Kernsharles E Fields, MD;  [facility];  Service: Vascular;  Laterality: Left;  . Insertion of dialysis catheter Bilateral 04/11/2014    Procedure: INSERTION OF DIALYSIS CATHETER RIGHT FEMORAL VEIN; INSERTION OF TRIPLE LUMEN LEFT FEMORAL VEIN CENTRAL LINE; REMOVAL OF DIALYSIS CATHETER IN RIGHT FEMORAL VEIN.;  Surgeon: Nada LibmanVance W Brabham, MD;  Location: [facility] OR;  Service: Vascular;  Laterality: Bilateral;  . Exchange of a dialysis catheter Right 04/29/2014    Procedure: EXCHANGE OF A  FEMORAL DIALYSIS CATHETER;  Surgeon: Pryor OchoaJames D Lawson, MD;  [facility];  Service: Vascular;  Laterality: Right;  . Insertion of dialysis catheter Right 05/09/2014    Procedure: INSERTION OF DIALYSIS CATHETER- FEMORAL;  Surgeon: Fransisco HertzBrian L Chen, MD;  [facility];  Service: Vascular;  Laterality: Right;  . Radiology with anesthesia N/A 05/11/2014    Procedure: RADIOLOGY WITH ANESTHESIA;  Surgeon: Durwin Glazeayne Daniel Hassell III, MD;  [facility];  Service: Radiology;  Laterality: N/A;   History reviewed. No pertinent family history. History   Social History  . Marital Status: Single    Spouse Name: N/A    Number of Children: 0  . Years of Education: N/A   Occupational History  .     Social History Main Topics  . Smoking status: Never Smoker   . Smokeless tobacco: Never Used  . Alcohol Use: No  . Drug Use: No  . Sexual Activity: No   Other Topics Concern  . Not on file   Social History Narrative   Patient is living with care providers.    Patient is right handed.    Patient does not have any children.    Patient is on disability           Review of Systems: Review of systems not obtained due to patient factors.  Physical Exam: Blood pressure 90/55, pulse 96, temperature 98.8 F (37.1 C),  temperature source Rectal, resp. rate 21, SpO2 99 %.  Gen: Sleeping but arousable,  followed command to open mouth, no acute distress HEENT: Atraumatic, PERRL, proptosis, moist mucous membranes, dried mucous in nares Heart: Regular rate and rhythm, normal S1 S2,2/6 systolic ejection murmur Lungs: Difficult to hear lung sounds as he was non-cooperative but breathing unlabored Abd: Soft, non-tender, non-distended, + bowel sounds, no hepatosplenomegaly Ext: No edema or cyanosis, , small non-tender, mobile fibrotic tissue palpable in L groin at site of previous HD catheter, R femoral HD catheter intact   Lab results: Basic Metabolic Panel:  Recent Labs  16/10/96 1759  NA 139  K 3.6  CL 99  CO2 27  GLUCOSE 101*  BUN 12  CREATININE 4.36*  CALCIUM 8.5   CBC:  Recent Labs  10/26/14 1759  WBC 9.4  NEUTROABS 8.1*  HGB 8.9*  HCT 29.0*  MCV 97.3  PLT 163   Lactic acid 2.3 Phenytoin level <2.5  Imaging results:  Ct Abdomen Pelvis W Contrast  10/26/2014   CLINICAL DATA:  Patient arrives via EMS from dialysis center. Patient was extremely combative had a fever and possible UTI. Generalized abdominal pain.  EXAM: CT ABDOMEN AND PELVIS WITH CONTRAST  TECHNIQUE: Multidetector CT imaging of the abdomen and pelvis was performed using the standard protocol following bolus administration of intravenous contrast.  CONTRAST:  OMNIPAQUE IOHEXOL 300 MG/ML  SOLN  COMPARISON:  CT chest abdomen and pelvis 10/05/2013  FINDINGS: Evaluation of lung bases is limited due to motion artifact. Suggestion of vascular prominence and interstitial changes probably due to edema and fluid overload.  Examination is technically limited due to motion artifact. The liver, spleen, pancreas, adrenal glands, and retroperitoneal lymph nodes appear grossly unremarkable. Gallbladder is contracted appears to be filled with sludge or possibly small stones. No bile duct dilatation. Multi-cystic appearance of the kidneys, some  with calcification and some with increased density suggesting hemorrhage. Appearance is grossly unchanged since prior study. No hydronephrosis. Calcification of the abdominal aorta without aneurysm. Right femoral approach intravenous catheter with tip in the right atrium consistent with dialysis catheter. Stomach, small bowel, and colon appear decompressed. No free air or free fluid in the abdomen. Multiple venous collateral vessels are demonstrated throughout the subcutaneous soft tissues of the abdominal wall bilaterally. Previous study demonstrate sclerosis of the superior vena cava, likely accounting for the collaterals.  Pelvis: Stool in the rectosigmoid colon. No inflammatory changes. Bladder is decompressed. Prostate gland is not significantly enlarged. No free or loculated pelvic fluid collections. There is a fluid collection measuring 4.6 x 5 x 7.2 cm demonstrated in the right groin adjacent to vascular grafts arising from the common femoral artery. This is new since previous study and could represent developing hematoma. Alternatively, infected graft with abscess could also have this appearance. Infiltration in the subcutaneous fat consistent with edema. Mild prominence of lymph nodes in the groin regions bilaterally likely are reactive. With the mild lumbar scoliosis convex towards the left. Degenerative changes in the lumbar spine. No destructive bone lesions appreciated.  IMPRESSION: Multi-cystic appearance of both kidneys, unchanged since prior study. Multiple venous collaterals demonstrated throughout the subcutaneous soft tissues of the abdominal wall consistent with sclerosis of the superior vena cava noted on prior chest CT. Interval development of a fluid collection in the left groin region adjacent to vascular grafts. This could represent hematoma, pseudoaneurysm, or infection related to the graft. Sludge or small stones in the gallbladder.   Electronically Signed   By: Burman Nieves M.D.    On: 10/26/2014 23:42   Dg Chest Portable  1 View  10/26/2014   CLINICAL DATA:  Altered mental status. Congestive heart failure. Hypertension.  EXAM: PORTABLE CHEST - 1 VIEW  COMPARISON:  05/09/2014  FINDINGS: Right groin tunneled dialysis catheter noted, terminating in the right atrium.  The patient is rotated to the Right on today's radiograph, reducing diagnostic sensitivity and specificity. Low lung volumes. Mild atherosclerotic calcification of the aortic arch.  The lungs appear clear.  IMPRESSION: 1. Low lung volumes.  However, the lungs appear clear. 2. Dialysis catheter tip: Right atrium.   Electronically Signed   By: Herbie Baltimore M.D.   On: 10/26/2014 18:17    Other results: EKG: none in ED  Assessment & Plan by Problem: Principal Problem:   Acute encephalopathy Active Problems:   Hyperlipidemia   Chronic anemia   Mental retardation   Chronic hypotension   ESRD (end stage renal disease)   Secondary renal hyperparathyroidism   Seizure disorder   GERD (gastroesophageal reflux disease)   #Acute Encephalopathy: [name] has had increased agitation x 3 days per his caregiver in the EMR. During our interview he was sleeping and not agitated. The etiology is unclear. He successfully completed HD today and presenting BMP had no electrolyte abnormalities other than expected elevated creatinine. Infection is on differential. He has 2 of 4 SIRS criteria in the ED (tachycardia, tachypnea) but has been afebrile with no leukocytosis. His lactic acid was minimally elevated. CXR clear. He does not produce urine. He had a Pseudomonas bacteremia in 04/2014 after manipulating his catheter and it was replaced at that time. He may have similarly introduced bacteria through his HD site. Compared to prior, CT demonstrated development of a fluid collection in the left groin region adjacent to vascular grafts which could represent hematoma, pseudoaneurysm, or infection. ED contacted Dr Edilia Bo of vascular  who believed this is not clinically significant and probably a lymphocele. There was a fibrotic area palpable in his L groin which was likely scar tissue from his previous HD site. He may also have non-convulsive seizures as his phenytoin level was subtherapeutic on presentation. -acetaminophen 650 po or pr q6hprn -CMP -BCx x 2 -influenza panel -c diff by pcr -procalcitonin -HIV antibody -CT head wo contrast -EEG -consider neurology consult in am -phenytoin per pharmacy -B1, B12 -Ethanol -TSH -Ammonia -CK -Hemoglobin A1c -troponin-i -EKG -HIV  #ESRD on HD: [name]. [name] is on a Tu/Th/Sa HD schedule. He last had HD today (10/26/14) which was successfully completed. Creatinine on presentation today was 4.36 with GFR 15. He has had access issues for some time given a SVC clot and the loss of his L femoral access site during his previous hospitalization when the artery was nicked during a thrombolysis procedure. He was hospitalized in 04/2014 with a clotted R femoral HD catheter that required tPA and was replaced as he had Pseudomonas bacteremia. He has end-stage bilateral cystic kidneys noted on CT. -cont home sensipar 30 mg po daily, renvela 800 mg po tidwc, dialyvite tab po daily -cont HD T/Th/Sa -PT/OT    #Acquired Adrenal Insufficiency: [name]. [name] has documented adrenal insufficiency and is on Midodrine 15 mg tid at home. Previous hospitalizations required iv steroids with po wean. Does not appear he was on home steroids at this time. He received solu-cortef (hydrocortisone) 100 mg iv once in the ED. He will likely require continuation of stress-dosed steroids to maintain BP. Marland Kitchen He has chronic hypotension so his BP was monitored closely.  -am cortisol -consider with morning rounds whether he needs taper  as he had been off steroids for months and received one dose in ED  #Chronic Hypotension: [name] is chronically hypotensive with unknown etiology but likely due to acquired adrenal  insufficiency. His BP on presentation today was 86/55 and in the ED has been in the 70s-90s/40s-50s. During his hospitalizations in 04/2014 his BP generally was in the 70s-100s/40s-70s and he was asymptomatic. He is on midodrine 15 mg TID at home and received solucortef 100 mg iv once in the ED. He will likely require continuation of stress-dosed steroids to maintain BP. -cont to monitor -cont midodrine 15 mg po tid  #History of seizures: [name]. [name] has a history of seizures but family denies any recent seizures. At home he takes phenytoin 200 mg daily but his phenytoin level in the ED was undetectable (<2.5). His phenytoin level was low in the past and he would benefit from pharmacy review. We will also consider neurology eval whether this is appropriate anti-convulsive for him versus broader drug such as keppra -phenytoin per pharmacy -consider neurology consult -seizure precautions -CK  #Anemia: [name]. [name]'s hemoglobin on presentation is 8.9. His hemoglobin ranged from 7.7-9.2 during his last hospitalization in 04/2014. His most recent iron panel on 05/11/14 showed Iron 23, TIBC 171, Saturation 13, ferritin 309. This is consistent with anemia of chronic disease related to his kidneys although his MCV on presentation is 57.  -cont to monitor  #GERD: Likely non-contributory.  -cont home protonix 40 mg po daily  #Hyperlipidemia: No lipid panel in EMR but taking atorvastatin 20 mg qhs at home -lipid panel -cont atorvastatin 20 mg qhs  #Mental retardation: At baseline, [name]. [name] is non-verbal but grunts and motions. He lives in a group home and has a caregiver who was present in the ED. He also has two sisters who are very involved in his care. His sister [name] (cell [phone]) is his HCPOW.  -SLP eval and treat -eye care -oral care  #Diet: NPO until swallow eval, followed by renal diet  #DVT PPx: heparin 5000 u [place] tid  #Code: DNR  Dispo: Disposition is deferred at this time,  awaiting improvement of current medical problems. Anticipated discharge in approximately 2 day(s).   The patient does have a current PCP Courtney Paris, MD) and does need an [facility] hospital follow-up appointment after discharge.  The patient does have transportation limitations that hinder transportation to clinic appointments.  Signed: Lorenda Hatchet, MD 10/27/2014, 2:39 AM

## 2014-10-27 NOTE — Progress Notes (Signed)
PT Cancellation Note  Patient Details Name: [name] MRN: [id number] DOB: Feb 08, 1951   Cancelled Treatment:    Reason Eval/Treat Not Completed: Patient at procedure or test/unavailable. Pt currently undergoing EEG in room. Will check back as schedule allows to complete PT eval.    Conni SlipperKirkman, Donnie Panik 10/27/2014, 11:15 PM   Conni SlipperLaura Hara Milholland, PT, DPT Acute Rehabilitation Services Pager: [phone]

## 2014-10-27 NOTE — Progress Notes (Signed)
Utilization Review Completed.[name] T1/05/2015  

## 2014-10-27 NOTE — Evaluation (Signed)
Physical Therapy Evaluation Patient Details Name: [name] MRN: [id number] DOB: [birth year] Today's Date: 10/27/2014   History of Present Illness  Pt admitted after HD with fever, AMS and agitation, recent diarrhea and indication of abdominal pain. 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  Clinical Impression  Pt admitted with above diagnosis. Pt currently with functional limitations due to the deficits listed below (see PT Problem List). At the time of PT eval pt was able to perform transfers with +2 assist. Pt resisting movement however tolerated sitting EOB for ~8 minutes with postural support.   Pt will benefit from skilled PT to increase their independence and safety with mobility to allow discharge to the venue listed below.  Per caregiver, pt ambulates without assist at home, and will continue to update follow-up recommendations as therapy progresses.      Follow Up Recommendations Home health PT;Supervision/Assistance - 24 hour    Equipment Recommendations  Other (comment) (TBD by progress with PT)    Recommendations for Other Services       Precautions / Restrictions Precautions Precautions: Fall Restrictions Weight Bearing Restrictions: No      Mobility  Bed Mobility Overal bed mobility: Needs Assistance Bed Mobility: Supine to Sit;Sit to Supine     Supine to sit: +2 for physical assistance;Total assist Sit to supine: Min guard   General bed mobility comments: Pt likely to be able to do more, resistant to bed mobility.  Transfers                 General transfer comment: No further mobility was attempted as pt was resistive and with a history of becoming easily agitated earlier today.   Ambulation/Gait                Stairs            Wheelchair Mobility    Modified Rankin (Stroke Patients Only)       Balance Overall balance assessment: Needs assistance Sitting-balance  support: Bilateral upper extremity supported;Feet supported Sitting balance-Leahy Scale: Poor Sitting balance - Comments: sat x 8 minutes, leans L Postural control: Left lateral lean                                   Pertinent Vitals/Pain Pain Assessment: No/denies pain    Home Living Family/patient expects to be discharged to:: Private residence Living Arrangements: Non-relatives/Friends Available Help at Discharge: Personal care attendant;Available 24 hours/day Type of Home: House (split level) Home Access: Ramped entrance     Home Layout: Two level;Bed/bath upstairs Home Equipment: Shower seat Additional Comments: information gained from caregiver by phone    Prior Function Level of Independence: Needs assistance   Gait / Transfers Assistance Needed: ambulates without device  ADL's / Homemaking Assistance Needed: pt can dress, feed and toilet independently, assist for showering        Hand Dominance   Dominant Hand: Right    Extremity/Trunk Assessment   Upper Extremity Assessment: Overall WFL for tasks assessed           Lower Extremity Assessment: Defer to PT evaluation      Cervical / Trunk Assessment: Kyphotic (with forward head in sitting)  Communication   Communication: Other (comment) (non verbal, grunts, points, nods head)  Cognition Arousal/Alertness: Awake/alert Behavior During Therapy: Flat affect Overall Cognitive Status: History of cognitive impairments - at baseline  General Comments      Exercises        Assessment/Plan    PT Assessment    PT Diagnosis     PT Problem List    PT Treatment Interventions     PT Goals (Current goals can be found in the Care Plan section) Acute Rehab PT Goals Patient Stated Goal: Pt unable to state. PT Goal Formulation: Patient unable to participate in goal setting Time For Goal Achievement: 11/03/14 Potential to Achieve Goals: Good    Frequency Min  3X/week   Barriers to discharge        Co-evaluation   Reason for Co-Treatment: Necessary to address cognition/behavior during functional activity;For patient/therapist safety PT goals addressed during session: Mobility/safety with mobility OT goals addressed during session: ADL's and self-care       End of Session   Activity Tolerance: Patient limited by lethargy Patient left: in bed;with call bell/phone within reach;with bed alarm set Nurse Communication: Mobility status         Time: 1449-1511 PT Time Calculation (min) (ACUTE ONLY): 22 min   Charges:   PT Evaluation $Initial PT Evaluation Tier I: 1 Procedure     PT G CodesConni Slipper 11-26-2014, 3:46 PM  Conni Slipper, PT, DPT Acute Rehabilitation Services Pager: [phone]

## 2014-10-28 DIAGNOSIS — I959 Hypotension, unspecified: Secondary | ICD-10-CM

## 2014-10-28 DIAGNOSIS — R6521 Severe sepsis with septic shock: Secondary | ICD-10-CM

## 2014-10-28 DIAGNOSIS — A419 Sepsis, unspecified organism: Secondary | ICD-10-CM

## 2014-10-28 DIAGNOSIS — G9340 Encephalopathy, unspecified: Secondary | ICD-10-CM

## 2014-10-28 DIAGNOSIS — A412 Sepsis due to unspecified staphylococcus: Secondary | ICD-10-CM

## 2014-10-28 DIAGNOSIS — B958 Unspecified staphylococcus as the cause of diseases classified elsewhere: Secondary | ICD-10-CM

## 2014-10-28 DIAGNOSIS — R7881 Bacteremia: Secondary | ICD-10-CM

## 2014-10-28 DIAGNOSIS — I82409 Acute embolism and thrombosis of unspecified deep veins of unspecified lower extremity: Secondary | ICD-10-CM

## 2014-10-28 DIAGNOSIS — A4101 Sepsis due to Methicillin susceptible Staphylococcus aureus: Secondary | ICD-10-CM | POA: Diagnosis present

## 2014-10-28 DIAGNOSIS — I8290 Acute embolism and thrombosis of unspecified vein: Secondary | ICD-10-CM | POA: Diagnosis not present

## 2014-10-28 LAB — CBC
HCT: 28.1 % — ABNORMAL LOW (ref 39.0–52.0)
Hemoglobin: 8.5 g/dL — ABNORMAL LOW (ref 13.0–17.0)
MCH: 29.2 pg (ref 26.0–34.0)
MCHC: 30.2 g/dL (ref 30.0–36.0)
MCV: 96.6 fL (ref 78.0–100.0)
Platelets: 172 K/uL (ref 150–400)
RBC: 2.91 MIL/uL — ABNORMAL LOW (ref 4.22–5.81)
RDW: 18.3 % — ABNORMAL HIGH (ref 11.5–15.5)
WBC: 6.7 K/uL (ref 4.0–10.5)

## 2014-10-28 LAB — BASIC METABOLIC PANEL WITH GFR
Anion gap: 18 — ABNORMAL HIGH (ref 5–15)
BUN: 39 mg/dL — ABNORMAL HIGH (ref 6–23)
CO2: 19 mmol/L (ref 19–32)
Calcium: 8.2 mg/dL — ABNORMAL LOW (ref 8.4–10.5)
Chloride: 103 meq/L (ref 96–112)
Creatinine, Ser: 8.24 mg/dL — ABNORMAL HIGH (ref 0.50–1.35)
GFR calc Af Amer: 7 mL/min — ABNORMAL LOW
GFR calc non Af Amer: 6 mL/min — ABNORMAL LOW
Glucose, Bld: 159 mg/dL — ABNORMAL HIGH (ref 70–99)
Potassium: 4.6 mmol/L (ref 3.5–5.1)
Sodium: 140 mmol/L (ref 135–145)

## 2014-10-28 LAB — HIV ANTIBODY (ROUTINE TESTING W REFLEX)
HIV 1/O/2 Abs-Index Value: <1 (ref <1.00)
HIV-1/HIV-2 Ab: NONREACTIVE

## 2014-10-28 LAB — GLUCOSE, CAPILLARY: Glucose-Capillary: 124 mg/dL — ABNORMAL HIGH (ref 70–99)

## 2014-10-28 LAB — CORTISOL-AM, BLOOD: Cortisol - AM: 74.7 ug/dL — ABNORMAL HIGH (ref 4.3–22.4)

## 2014-10-28 MED ORDER — PHENYLEPHRINE HCL 10 MG/ML IJ SOLN
0.0000 ug/min | INTRAVENOUS | Status: DC
  Filled 2014-10-28: qty 1

## 2014-10-28 MED ORDER — ENOXAPARIN SODIUM 60 MG/0.6ML ~~LOC~~ SOLN
60.0000 mg | SUBCUTANEOUS | Status: DC
  Filled 2014-10-28: qty 0.6

## 2014-10-28 MED ORDER — SODIUM CHLORIDE 0.9 % IV BOLUS (SEPSIS)
1000.0000 mL | Freq: Once | INTRAVENOUS | Status: AC
  Administered 2014-10-28: 1000 mL via INTRAVENOUS

## 2014-10-28 MED ORDER — ENOXAPARIN SODIUM 60 MG/0.6ML ~~LOC~~ SOLN
60.0000 mg | SUBCUTANEOUS | Status: DC
  Administered 2014-10-28 – 2014-10-31 (×3): 60 mg via SUBCUTANEOUS
  Filled 2014-10-28 (×5): qty 0.6

## 2014-10-28 MED ORDER — CEFTRIAXONE SODIUM IN DEXTROSE 20 MG/ML IV SOLN
1.0000 g | INTRAVENOUS | Status: DC
  Administered 2014-10-28: 1 g via INTRAVENOUS
  Filled 2014-10-28 (×2): qty 50

## 2014-10-28 MED ORDER — HYDROCORTISONE NA SUCCINATE PF 100 MG IJ SOLR
100.0000 mg | Freq: Three times a day (TID) | INTRAMUSCULAR | Status: DC
  Filled 2014-10-28 (×3): qty 2

## 2014-10-28 MED ORDER — SODIUM CHLORIDE 0.9 % IV SOLN
INTRAVENOUS | Status: AC
  Administered 2014-10-28: 01:00:00 via INTRAVENOUS

## 2014-10-28 MED ORDER — SODIUM CHLORIDE 0.9 % IV BOLUS (SEPSIS)
500.0000 mL | Freq: Once | INTRAVENOUS | Status: AC
  Administered 2014-10-28: 500 mL via INTRAVENOUS

## 2014-10-28 MED ORDER — SODIUM CHLORIDE 0.9 % IV BOLUS (SEPSIS): 1000.0000 mL | Freq: Once | INTRAVENOUS | Status: DC

## 2014-10-28 MED ORDER — SODIUM CHLORIDE 0.9 % IV SOLN: INTRAVENOUS | Status: DC | PRN

## 2014-10-28 MED ORDER — HYDROCORTISONE NA SUCCINATE PF 100 MG IJ SOLR
50.0000 mg | Freq: Four times a day (QID) | INTRAMUSCULAR | Status: DC
  Administered 2014-10-28 – 2014-10-30 (×7): 50 mg via INTRAVENOUS
  Filled 2014-10-28 (×20): qty 1

## 2014-10-28 MED ORDER — DOXERCALCIFEROL 4 MCG/2ML IV SOLN
INTRAVENOUS | Status: AC
  Filled 2014-10-28: qty 2

## 2014-10-28 NOTE — Progress Notes (Signed)
Pt transferred to 6E07. Bed in lowest position and bed alarm on. Call bell in reach. Will continue to monitor.

## 2014-10-28 NOTE — Progress Notes (Signed)
PULMONARY / CRITICAL CARE MEDICINE PROGRESS/ TRANSFER NOTE   Name: [name] MRN: [id number] DOB: [birth year]    ADMISSION DATE:  10/26/2014  PRIMARY SERVICE: PCCM  CHIEF COMPLAINT:  Hypotension, Severe sepsis  BRIEF PATIENT DESCRIPTION: 63yom with PMH of MR (nonverbal), ESRD, adrenal insufficiency with chronic hypotension, sz d/o presented on 10/26/14 with increasing agitation, thought to be due to a R femoral HD cath infection.  Pt was started on broad Abx but became progressively hypotensive on evening of 1/8 ultimately prompting MICU transfer.    SIGNIFICANT EVENTS / STUDIES:  10/27/14: Admitted, admit blood cx pos for GPC's 10/28/14: Transferred to ICU for hypotension  LINES / TUBES: R fem HD cath   CULTURES: 10/26/14: Blood cx x 2 pos for GPC's in clusters 10/27/14: Blood cx pending  ANTIBIOTICS: Vanc: 10/26/14--> Zosyn:  10/26/14-->  HPI:  63yom with PMH of MR (nonverbal), ESRD, adrenal insufficiency with chronic hypotension, sz d/o presented on 10/26/14 with increasing agitation, thought to be due to a R femoral HD cath infection.  Throughout the day of 1/8, the pt became less responsive and during the evening more hypotensive.  He was noted to be febrile to 103 as well.  He received 1.5L NS with some improvement but given his hypotension, he was transferred to the MICU for further management.    PAST MEDICAL HISTORY :  Past Medical History  Diagnosis Date  . Heart murmur, systolic 08/10/2009  . Syncope 05/07/2009  . 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 Esophageal varices 11/07/2008  . Gastric ulcer 10/09    antral, with h pylori positive  . Congestive heart failure 03/06/2008  . Cellulitis and abscess of leg, except foot 03/06/2008  . Secondary hyperparathyroidism 02/02/2008  . Mute 02/02/2008  . Hyperlipidemia 02/02/2008  . Anemia 02/02/2008  . ESRD (end stage renal disease)     TTS hemodialysis  . GERD (gastroesophageal reflux disease)   . Hypertension 02/02/2008     in history  . Mental retardation 02/02/2008  . Mute   . Seizures     "non in a while at home". none in past year .  Marland Kitchen. Complication of anesthesia     in the past BP has dropped   . Sepsis due to Pseudomonas species 05/05/2014   Past Surgical History  Procedure Laterality Date  . Left forearm graft      for HD  . Arteriovenous graft placement  11/22/10    Right thigh AVG  . Thrombectomy and revision of arterioventous (av) goretex  graft    . Thrombectomy and revision of arterioventous (av) goretex  graft  10/22/2012    Procedure: THROMBECTOMY AND REVISION OF ARTERIOVENTOUS (AV) GORETEX  GRAFT;  Surgeon: Larina Earthlyodd F Early, MD;  [facility];  Service: Vascular;  Laterality: Right;  . Thrombectomy w/ embolectomy  11/10/2012    Procedure: THROMBECTOMY ARTERIOVENOUS GORE-TEX GRAFT;  Surgeon: Pryor OchoaJames D Lawson, MD;  [facility];  Service: Vascular;  Laterality: Right;  . Thrombectomy and revision of arterioventous (av) goretex  graft Right 12/08/2012    Procedure: THROMBECTOMY AND REVISION OF ARTERIOVENTOUS (AV) GORETEX  GRAFT right thigh;  Surgeon: Sherren Kernsharles E Fields, MD;  [facility];  Service: Vascular;  Laterality: Right;  Susie Cassette. Venogram N/A 12/08/2012    Procedure: VENOGRAM;  Surgeon: Sherren Kernsharles E Fields, MD;  [facility];  Service: Vascular;  Laterality: N/A;  Intraoperative Central venogram  . Thrombectomy w/ embolectomy Right 12/12/2012    Procedure: THROMBECTOMY ARTERIOVENOUS GORE-TEX GRAFT;  Surgeon: Nada Libman, MD;  Location: [facility] OR;  Service: Vascular;  Laterality: Right;  . Insertion of dialysis catheter Left 12/14/2012    Procedure: INSERTION OF DIALYSIS CATHETER;  Surgeon: Nada Libman, MD;  Location: [facility] OR;  Service: Vascular;  Laterality: Left;  . Insertion of dialysis catheter Right 01/13/2013    Procedure: INSERTION OF DIALYSIS CATHETER;  Surgeon: Nada Libman, MD;  Location: [facility] OR;  Service: Vascular;  Laterality: Right;  . Removal of a dialysis catheter Left 01/13/2013     Procedure: REMOVAL OF A DIALYSIS CATHETER;  Surgeon: Nada Libman, MD;  Location: [facility] OR;  Service: Vascular;  Laterality: Left;  . Av fistula placement Left 02/11/2013    Procedure: INSERTION OF ARTERIOVENOUS (AV) GORE-TEX GRAFT THIGH;  Surgeon: Nada Libman, MD;  Location: [facility] OR;  Service: Vascular;  Laterality: Left;  using 6mm x 50cm Gore-Tex Vascular Graft  . Esophagogastroduodenoscopy N/A 02/16/2013    Procedure: ESOPHAGOGASTRODUODENOSCOPY (EGD);  Surgeon: Vertell Novak., MD;  Location: [facility] ENDOSCOPY;  Service: Endoscopy;  Laterality: N/A;  bedside  . Esophagogastroduodenoscopy N/A 09/14/2013    Procedure: ESOPHAGOGASTRODUODENOSCOPY (EGD);  Surgeon: Vertell Novak., MD;  Location: [facility] ENDOSCOPY;  Service: Endoscopy;  Laterality: N/A;  control of bleeding if needed  . Esophagogastroduodenoscopy N/A 10/03/2013    Procedure: ESOPHAGOGASTRODUODENOSCOPY (EGD);  Surgeon: Theda Belfast, MD;  Location: [facility] ENDOSCOPY;  Service: Endoscopy;  Laterality: N/A;  Bedside  . Radiology with anesthesia Right 10/19/2013    Procedure: RADIOLOGY WITH ANESTHESIA;  Surgeon: Malachy Moan, MD;  Location: [facility] OR;  Service: Radiology;  Laterality: Right;  . Radiology with anesthesia Left 12/28/2013    Procedure: RADIOLOGY WITH ANESTHESIA;  Surgeon: Reola Calkins, MD;  Location: [facility] OR;  Service: Radiology;  Laterality: Left;  . Thrombectomy and revision of arterioventous (av) goretex  graft Left 01/25/2014    Procedure: THROMBECTOMY AND REVISION OF LEFT THIGH ARTERIOVENTOUS (AV) GORETEX  GRAFT WITH PATCH ANGIOPLASTY;  Surgeon: Pryor Ochoa, MD;  Location: [facility] OR;  Service: Vascular;  Laterality: Left;  . Thrombectomy and revision of arterioventous (av) goretex  graft Left 04/10/2014    Procedure: THROMBECTOMY AND REVISION OF ARTERIOVENTOUS (AV) GORETEX  GRAFT;  Surgeon: Sherren Kerns, MD;  Location: [facility] OR;  Service: Vascular;  Laterality: Left;  . Patch angioplasty Left 04/10/2014    Procedure: PATCH  ANGIOPLASTY LEFT SFA;  Surgeon: Sherren Kerns, MD;  Location: [facility] OR;  Service: Vascular;  Laterality: Left;  . Insertion of dialysis catheter Bilateral 04/11/2014    Procedure: INSERTION OF DIALYSIS CATHETER RIGHT FEMORAL VEIN; INSERTION OF TRIPLE LUMEN LEFT FEMORAL VEIN CENTRAL LINE; REMOVAL OF DIALYSIS CATHETER IN RIGHT FEMORAL VEIN.;  Surgeon: Nada Libman, MD;  Location: [facility] OR;  Service: Vascular;  Laterality: Bilateral;  . Exchange of a dialysis catheter Right 04/29/2014    Procedure: EXCHANGE OF A  FEMORAL DIALYSIS CATHETER;  Surgeon: Pryor Ochoa, MD;  Location: [facility] OR;  Service: Vascular;  Laterality: Right;  . Insertion of dialysis catheter Right 05/09/2014    Procedure: INSERTION OF DIALYSIS CATHETER- FEMORAL;  Surgeon: Fransisco Hertz, MD;  Location: [facility] OR;  Service: Vascular;  Laterality: Right;  . Radiology with anesthesia N/A 05/11/2014    Procedure: RADIOLOGY WITH ANESTHESIA;  Surgeon: Durwin Glaze III, MD;  Location: [facility] OR;  Service: Radiology;  Laterality: N/A;   Prior to Admission medications   Medication Sig Start Date End Date Taking? Authorizing Provider  atorvastatin (LIPITOR)  20 MG tablet Take 1 tablet (20 mg total) by mouth at bedtime.   Yes Courtney Paris, MD  cinacalcet (SENSIPAR) 30 MG tablet Take 1 tablet (30 mg total) by mouth daily with breakfast. 06/19/14  Yes Gara Kroner, MD  clonazePAM (KLONOPIN) 0.5 MG tablet Take 0.5 tablets (0.25 mg total) by mouth 2 (two) times daily. 06/19/14  Yes Gara Kroner, MD  folic acid-vitamin b complex-vitamin c-selenium-zinc (DIALYVITE) 3 MG TABS tablet Take 1 tablet by mouth daily.   Yes Historical Provider, MD  midodrine (PROAMATINE) 10 MG tablet Take 1.5 tablets (15 mg total) by mouth 3 (three) times daily. 06/21/14  Yes Gara Kroner, MD  pantoprazole (PROTONIX) 40 MG tablet Take 1 tablet (40 mg total) by mouth daily. 06/19/14  Yes Gara Kroner, MD  phenytoin (DILANTIN) 100 MG ER capsule Take 200 mg by mouth at bedtime.   Yes  Historical Provider, MD  sevelamer carbonate (RENVELA) 800 MG tablet Take 800 mg by mouth 3 (three) times daily with meals.   Yes Historical Provider, MD   No Known Allergies  FAMILY HISTORY:  History reviewed. No pertinent family history. SOCIAL HISTORY:  reports that he has never smoked. He has never used smokeless tobacco. He reports that he does not drink alcohol or use illicit drugs.  REVIEW OF SYSTEMS:  Unable to obtain  SUBJECTIVE: Will localize to voice and grimace  VITAL SIGNS: Temp:  [97.4 F (36.3 C)-103.1 F (39.5 C)] 103.1 F (39.5 C) (01/09 0045) Pulse Rate:  [100-118] 113 (01/09 0045) Resp:  [16-32] 28 (01/09 0045) BP: (55-128)/(30-57) 59/44 mmHg (01/08 2321) SpO2:  [91 %-100 %] 91 % (01/09 0045) Weight:  [125 lb 14.1 oz (57.1 kg)-128 lb 8.5 oz (58.3 kg)] 128 lb 8.5 oz (58.3 kg) (01/08 2159) HEMODYNAMICS:   VENTILATOR SETTINGS:   INTAKE / OUTPUT: Intake/Output      01/08 0701 - 01/09 0700   Total Intake(mL/kg)    Total Output 0   Net 0         PHYSICAL EXAMINATION: General:  Awake, Increased WOB Neuro:  Localizes to voice HEENT:  PERRL Cardiovascular:  Tachy, reg Lungs:  Increased RR, CTAB Abdomen:  +BS Musculoskeletal:  R fem HD cath in place  LABS:  CBC  Recent Labs Lab 10/26/14 1759 10/27/14 0620  WBC 9.4 8.1  HGB 8.9* 8.5*  HCT 29.0* 27.7*  PLT 163 183   Coag's  Recent Labs Lab 10/27/14 0650  APTT 42*  INR 1.36   BMET  Recent Labs Lab 10/26/14 1759  NA 139  K 3.6  CL 99  CO2 27  BUN 12  CREATININE 4.36*  GLUCOSE 101*   Electrolytes  Recent Labs Lab 10/26/14 1759 10/27/14 0650  CALCIUM 8.5  --   MG  --  2.0  PHOS  --  5.8*   Sepsis Markers  Recent Labs Lab 10/26/14 1759 10/27/14 0650  LATICACIDVEN 2.3* 1.6  PROCALCITON  --  29.74   ABG No results for input(s): PHART, PCO2ART, PO2ART in the last 168 hours. Liver Enzymes No results for input(s): AST, ALT, ALKPHOS, BILITOT, ALBUMIN in the last 168  hours. Cardiac Enzymes  Recent Labs Lab 10/27/14 0650  TROPONINI 0.03   Glucose No results for input(s): GLUCAP in the last 168 hours.  Imaging Ct Head Wo Contrast  10/27/2014   CLINICAL DATA:  Altered mental status  EXAM: CT HEAD WITHOUT CONTRAST  TECHNIQUE: Contiguous axial images were obtained from the base of the skull through the  vertex without intravenous contrast.  COMPARISON:  None.  FINDINGS: Diffuse atrophic changes are noted. No findings to suggest acute hemorrhage, acute infarction or space-occupying mass lesion are noted. The bony calvarium is intact.  IMPRESSION: Atrophic changes without acute abnormality.   Electronically Signed   By: Alcide Clever M.D.   On: 10/27/2014 13:51   Ct Abdomen Pelvis W Contrast  10/26/2014   CLINICAL DATA:  Patient arrives via EMS from dialysis center. Patient was extremely combative had a fever and possible UTI. Generalized abdominal pain.  EXAM: CT ABDOMEN AND PELVIS WITH CONTRAST  TECHNIQUE: Multidetector CT imaging of the abdomen and pelvis was performed using the standard protocol following bolus administration of intravenous contrast.  CONTRAST:  OMNIPAQUE IOHEXOL 300 MG/ML  SOLN  COMPARISON:  CT chest abdomen and pelvis 10/05/2013  FINDINGS: Evaluation of lung bases is limited due to motion artifact. Suggestion of vascular prominence and interstitial changes probably due to edema and fluid overload.  Examination is technically limited due to motion artifact. The liver, spleen, pancreas, adrenal glands, and retroperitoneal lymph nodes appear grossly unremarkable. Gallbladder is contracted appears to be filled with sludge or possibly small stones. No bile duct dilatation. Multi-cystic appearance of the kidneys, some with calcification and some with increased density suggesting hemorrhage. Appearance is grossly unchanged since prior study. No hydronephrosis. Calcification of the abdominal aorta without aneurysm. Right femoral approach intravenous  catheter with tip in the right atrium consistent with dialysis catheter. Stomach, small bowel, and colon appear decompressed. No free air or free fluid in the abdomen. Multiple venous collateral vessels are demonstrated throughout the subcutaneous soft tissues of the abdominal wall bilaterally. Previous study demonstrate sclerosis of the superior vena cava, likely accounting for the collaterals.  Pelvis: Stool in the rectosigmoid colon. No inflammatory changes. Bladder is decompressed. Prostate gland is not significantly enlarged. No free or loculated pelvic fluid collections. There is a fluid collection measuring 4.6 x 5 x 7.2 cm demonstrated in the right groin adjacent to vascular grafts arising from the common femoral artery. This is new since previous study and could represent developing hematoma. Alternatively, infected graft with abscess could also have this appearance. Infiltration in the subcutaneous fat consistent with edema. Mild prominence of lymph nodes in the groin regions bilaterally likely are reactive. With the mild lumbar scoliosis convex towards the left. Degenerative changes in the lumbar spine. No destructive bone lesions appreciated.  IMPRESSION: Multi-cystic appearance of both kidneys, unchanged since prior study. Multiple venous collaterals demonstrated throughout the subcutaneous soft tissues of the abdominal wall consistent with sclerosis of the superior vena cava noted on prior chest CT. Interval development of a fluid collection in the left groin region adjacent to vascular grafts. This could represent hematoma, pseudoaneurysm, or infection related to the graft. Sludge or small stones in the gallbladder.   Electronically Signed   By: Burman Nieves M.D.   On: 10/26/2014 23:42   Dg Chest Portable 1 View  10/26/2014   CLINICAL DATA:  Altered mental status. Congestive heart failure. Hypertension.  EXAM: PORTABLE CHEST - 1 VIEW  COMPARISON:  05/09/2014  FINDINGS: Right groin tunneled  dialysis catheter noted, terminating in the right atrium.  The patient is rotated to the Right on today's radiograph, reducing diagnostic sensitivity and specificity. Low lung volumes. Mild atherosclerotic calcification of the aortic arch.  The lungs appear clear.  IMPRESSION: 1. Low lung volumes.  However, the lungs appear clear. 2. Dialysis catheter tip: Right atrium.   Electronically Signed  By: Herbie Baltimore M.D.   On: 10/26/2014 18:17     ASSESSMENT / PLAN:  Principal Problem:   Acute encephalopathy Active Problems:   Hyperlipidemia   Chronic anemia   Mental retardation   Chronic hypotension   ESRD (end stage renal disease)   Secondary renal hyperparathyroidism   Seizure disorder   GERD (gastroesophageal reflux disease)   History of adrenal insufficiency   PULMONARY A: Increased WOB likely 2/2 severe sepsis P:   Supplemental O2 prn sats >90% Monitor O2  CARDIOVASCULAR A: Tachycardia Hypotension: Likely 2/2 severe sepsis.  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  HD site in R fem is felt to represent final access site limiting our ability to place CVC for pressors  P:   Bolusing 1L NS now A-line for monitoring Will use neo if bolus not effective Cont home midodrine  RENAL A: ESRD on HD, TTS P:   Discuss case with renal in Am Cont sensipar, sevelamer  GASTROINTESTINAL A: No acute issues P:   NPO except meds as tolerated given lethargy  HEMATOLOGIC A: Chronic anemia P:   Cont epo with HD  INFECTIOUS A: Severe sepsis with likely septic shock (tachycardic, tachypneic febrile with GPC's in blood).  Pt currently receiving 2nd bolus.  Suspect line as source.  Will need to discuss long term options with renal in am as pt's access sites are limited and he is critically ill from a likely infected line. P:   Cont vanc/ zosyn Discuss with  renal  ENDOCRINE A: AI P:   Hydrocort 100q8hr  NEUROLOGIC A: Baseline MR Increased lethargy Sz d/o P:   Tx as above Cont home dilantin  BEST PRACTICE / DISPOSITION Level of Care:  ICU Primary Service:  PCCM Consultants:  Nephology Code Status:  No chest compressions but otherwise all aggressive measures (pressors, intubation, etc) wanted Diet:  NPO DVT Px:  SQH GI Px:  PPI Skin Integrity:  Q2 turns Social / Family:  Family (sister) updated via phone by primary team prior to transfer.    TODAY'S SUMMARY: Transferred to ICU for severe sepsis with hypotension.  Getting boluses and will add neo if needed.  Access sites severely limited 2/2 longterm HD.  Will need to discuss with renal as pt likely has infected HD cath.    I have personally obtained a history, examined the patient, evaluated laboratory and imaging results, formulated the assessment and plan and placed orders.  CRITICAL CARE: The patient is critically ill with multiple organ systems failure and requires high complexity decision making for assessment and support, frequent evaluation and titration of therapies, application of advanced monitoring technologies and extensive interpretation of multiple databases. Critical Care Time devoted to patient care services described in this note is 55 minutes.   Joen Laura, MD Pulmonary and Critical Care Medicine [facility] Pager: [phone]   10/28/2014, 2:43 AM

## 2014-10-28 NOTE — Progress Notes (Signed)
  Echocardiogram 2D Echocardiogram has been performed.  Delcie RochENNINGTON, Molleigh Huot 10/28/2014, 12:09 PM

## 2014-10-28 NOTE — Progress Notes (Signed)
I spoke to the sister [name] on the phone at approximately 1 am regarding possible transfer to the ICU and to clarify code status. She said she wanted all resuscitative measures except CPR. She explicitly said this includes intubation and pressors.  Farley Lyothman, Geordan Xu, MD 10/28/14 2:06 am

## 2014-10-28 NOTE — Progress Notes (Signed)
No distress + F/C  Filed Vitals:   10/28/14 0930 10/28/14 1000 10/28/14 1030 10/28/14 1055  BP: 69/38 68/51 68/45  75/49  Pulse: 84 87 88 86  Temp:    98 F (36.7 C)  TempSrc:    Oral  Resp: 14  15 15   Height:      Weight:    61.7 kg (136 lb 0.4 oz)  SpO2:  100%  100%   HEENT WNL Chest clear Regular without M noted abd soft, NABS Ext cool without edema MAEs  I have reviewed all of today's lab results. Relevant abnormalities are discussed in the A/P section  No new CXR   BC 2/2 positive for staph sp  IMPRESSION ESRD Acute on chronic hypotension  Suspect component of sepsis  Concern for relative adrenal insufficiency > HC started 01/08 Staph bacteremia Limited code status  PLAN/REC: Tolerated HD in dialysis suite today Transfer to med-surg (6E) Change pip-tazo to ceftriaxone  ID should see with the positive blood cultures Cont vanc Hydrocortisone dose adjusted  Cont to wean to off as permitted by BP Cont midodrine Discussed with house staff  Billy Fischeravid Brain Honeycutt, MD ; [facility] Mobile [phone].  After 5:30 PM or weekends, call [phone]

## 2014-10-28 NOTE — Progress Notes (Signed)
ANTICOAGULATION CONSULT NOTE - Initial Consult  Pharmacy Consult for Lovenox Indication: DVT  No Known Allergies  Patient Measurements: Height: 5' (152.4 cm) Weight: 136 lb 0.4 oz (61.7 kg) IBW/kg (Calculated) : 50  Vital Signs: Temp: 97.8 F (36.6 C) (01/09 1347) Temp Source: Oral (01/09 1347) BP: 68/53 mmHg (01/09 1347) Pulse Rate: 80 (01/09 1347)  Labs:  Recent Labs  10/26/14 1759 10/27/14 0620 10/27/14 0650 10/28/14 0730  HGB 8.9* 8.5*  --  8.5*  HCT 29.0* 27.7*  --  28.1*  PLT 163 183  --  172  APTT  --   --  42*  --   LABPROT  --   --  16.9*  --   INR  --   --  1.36  --   CREATININE 4.36*  --   --  8.24*  TROPONINI  --   --  0.03  --     Estimated Creatinine Clearance: 6.9 mL/min (by C-G formula based on Cr of 8.24).   Medical History: Past Medical History  Diagnosis Date  . Heart murmur, systolic 08/10/2009  . Syncope 05/07/2009  . 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 Esophageal varices 11/07/2008  . Gastric ulcer 10/09    antral, with h pylori positive  . Congestive heart failure 03/06/2008  . Cellulitis and abscess of leg, except foot 03/06/2008  . Secondary hyperparathyroidism 02/02/2008  . Mute 02/02/2008  . Hyperlipidemia 02/02/2008  . Anemia 02/02/2008  . ESRD (end stage renal disease)     TTS hemodialysis  . GERD (gastroesophageal reflux disease)   . Hypertension 02/02/2008    in history  . Mental retardation 02/02/2008  . Mute   . Seizures     "non in a while at home". none in past year .  Marland Kitchen. Complication of anesthesia     in the past BP has dropped   . Sepsis due to Pseudomonas species 05/05/2014    Assessment: 63yo male with ESRD on HD (TTS) who presented to ED on 1/7 with AMS likely d/t infection in R femoral cath site. Pt was initially started on subcutaneous heparin for DVT/PE prophylaxis. ECHO on 1/9 found new thrombus/mass in IVC extending into right atrium.  Pharmacy consulted to dose therapeutic Lovenox for VTE.  H&H low  but stable, PLT WNL, no bleeding noted.  Goal of Therapy:  Anti-Xa level 0.6-1 units/ml 4hrs after LMWH dose given Monitor platelets by anticoagulation protocol: Yes   Plan:  -Lovenox 60mg  SubQ Q24hr (1mg /kg/day for ESRD pt) -Monitor CBC Q72hr and s/sx of bleeding -F/U LE venous dopplers and/or CT chest/abdomen  Waynette Butteryegan K. Kanitra Purifoy, PharmD Clinical Pharmacy Resident Pager: [phone] 10/28/2014 3:02 PM

## 2014-10-28 NOTE — Procedures (Signed)
Arterial Catheter Insertion Procedure Note [name] [id number] 11/21/1950  Procedure: Insertion of Arterial Catheter  Indications: Blood pressure monitoring  Procedure Details Consent: Risks of procedure as well as the alternatives and risks of each were explained to the (patient/caregiver).  Consent for procedure obtained. and Unable to obtain consent because of altered level of consciousness. Time Out: Verified patient identification, verified procedure, site/side was marked, verified correct patient position, special equipment/implants available, medications/allergies/relevent history reviewed, required imaging and test results available.  Performed  Maximum sterile technique was used including antiseptics, cap, gloves, gown, hand hygiene, mask and sheet. Skin prep: Chlorhexidine; local anesthetic administered 20 gauge catheter was inserted into right radial artery using the Seldinger technique.  Evaluation Blood flow good; BP tracing good. Complications: No apparent complications.   [name], [name] 10/28/2014

## 2014-10-28 NOTE — Progress Notes (Signed)
Patient transferred to [phone]. Report given to KingsleyJasmine, Charity fundraiserN. All questions answered at bedside. Patient in no signs of discomfort of distress.

## 2014-10-28 NOTE — Progress Notes (Signed)
Patient ID: [name], male   DOB: June 11, 1951, 64 y.o.   MRN: [id number] Medicine attending: I examined this patient today and reviewed pertinent laboratory and radiographic data. I concur with the evaluation and management plan as recorded by resident physician Dr. Danella Pentonruong with the following additions: The patient is under treatment for staphylococcal sepsis with likely etiology infected vascular catheter in his right groin. Unfortunately, he has exhausted all vascular access areas and we will likely need to keep the catheter in place despite the infection as we have done in the past. He appears back to his baseline status today. He is comfortable and alert. He does respond by shaking his head yes or no to most questions. He is nonverbal. The catheter site in the groin appears stable with no palpable tenderness and no surrounding erythema or exudate.  New Finding today was a large clot in his inferior vena cava. I discussed anticoagulation management with the senior resident. Management decisions are difficult in view of his end-stage renal disease and the fact that most anticoagulants are excreted through the kidneys. Dose adjusted, therapeutic, twice daily, subcutaneous heparin would be the optimal anti-coagulant but is cumbersome, and long-term use is associated with significant osteoporosis, and the possibility of heparin-induced thrombocytopenia. I propose that we use dose adjusted low molecular weight heparin checking peak Xa levels 4 hours after a dose to maintain a Xa level of 0.6-1.1. Pharmacy has reviewed these recommendations and is in agreement. We will stop his prophylactic dose unfractionated heparin and change to therapeutic Lovenox as outlined.

## 2014-10-28 NOTE — Progress Notes (Addendum)
Pt given 2nd bolus of 250 ml NS and another dose of 50 mg solu-cortef. Unable to retrieve a manual blood pressure. Assessed by 2 RNs at bedside. Doppler taken on right arm shows that systolic blood pressure is 48. Continues to be lethargic. Arousable briefly to voice then goes back to sleep. Most recent vitals are: Blood pressure 45 systolic with doppler, pulse 113, temperature 103.1, temperature source axillary , resp. rate 28, height 5' (1.524 m), weight 58.3 kg (128 lb 8.5 oz), SpO2 99 %. Jena Gaussosman MD and Nehemiah SettleBrooke RN from rapid response notified. New orders to transfer patient off floor. Family has been notified. Gilman Schmidtembrina, Freddrick Gladson J

## 2014-10-28 NOTE — Progress Notes (Addendum)
Called [name] (pt's sister) in regards to code status. Phone given to [facility] MD for further discussion. Per pt's sister, okay to give pressors for blood pressure.

## 2014-10-28 NOTE — Procedures (Signed)
Pt seen on HD.  Ap 200 Vp 170  BFR 300 due to increase in AP.  I think best approach is prolonged therapy with AB.  If he fails this then could consider pulling catheter for few days and attempting replacement though this runs the risk of not being able to get an access and would require general anesthesia (this has been required in the past due to combativeness)

## 2014-10-28 NOTE — Progress Notes (Addendum)
Call received per floor RN for pt with worsening low BP and lethargy. ESRD pt originally admitted for change of LOC and concern for infection. BC resulting positive today. Teaching service already consulted per floor RN for low BP. SoluCortef and 250 fluid bolus ordered and recently given. Pt BP remains low per manual BP. Pt currently a DNR however goals of care are not clearly identified in chart. Resident Dr. Farley LyAdam Rothman  paged by myself and updated on pt status. I recommended consulting PCCM for possible ICU admit over SDU considering pt BP is critically low. PCCM consulted per resident team. Pt found resting in bed, arouses easily to voice, lethargic. RR at rest 20-30, po2 97 on 2 LNC, HR 110s. Floor RN will await update per PCCM. RRT to follow closely.

## 2014-10-28 NOTE — Progress Notes (Signed)
ICU transfer summary: [name] is a 64 year old man with non-verbal mental retardation, non verbal at baseline, ESRD on T/Th/Sa, acquired adrenal insufficiency, chronic hypotension, anemia, and history of seizures admitted for AMS likely 2/2 rt femoral catheter infxn. Pt became progressively hypotensive despite vanc and zosyn and was then transferred to the MICU. During MICU course, pt was bolused 2L NS, rt radial arterial line placed, and hydrocortisone started.   Subjective: Pt non verbal, resting in bed comfortably.   Objective: Vital signs in last 24 hours: Filed Vitals:   10/28/14 1055 10/28/14 1200 10/28/14 1223 10/28/14 1300  BP: 75/49 76/55  75/48  Pulse: 86 87  79  Temp: 98 F (36.7 C)  97.4 F (36.3 C)   TempSrc: Oral Oral Axillary Axillary  Resp: Height:      Weight: 136 lb 0.4 oz (61.7 kg)     SpO2: 100% 97%  97%   Weight change: 2 lb 10.3 oz (1.2 kg)  Intake/Output Summary (Last 24 hours) at 10/28/14 1345 Last data filed at 10/28/14 1055  Gross per 24 hour  Intake   1650 ml  Output   -300 ml  Net   1950 ml   General: NAD HEENT: moist mucous membranes Lungs: clear on anterior auscultation Cardiac: RRR, no murmurs, winced on chest wall palpation  GI: active bowel sounds, non tender to palpation, soft Ext: no pedal edema MSK: 5/5 biceps strength  Lab Results: Basic Metabolic Panel:  Recent Labs Lab 10/26/14 1759 10/27/14 0650 10/28/14 0730  NA 139  --  140  K 3.6  --  4.6  CL 99  --  103  CO2 27  --  19  GLUCOSE 101*  --  159*  BUN 12  --  39*  CREATININE 4.36*  --  8.24*  CALCIUM 8.5  --  8.2*  MG  --  2.0  --   PHOS  --  5.8*  --    CBC:  Recent Labs Lab 10/26/14 1759 10/27/14 0620 10/28/14 0730  WBC 9.4 8.1 6.7  NEUTROABS 8.1*  --   --   HGB 8.9* 8.5* 8.5*  HCT 29.0* 27.7* 28.1*  MCV 97.3 97.5 96.6  PLT 163 183 172   Cardiac Enzymes:  Recent Labs Lab 10/27/14 0650  TROPONINI 0.03   CBG  Recent Labs Lab  10/28/14 0211  GLUCAP 124*   Fasting Lipid Panel:  Recent Labs Lab 10/27/14 0650  CHOL 137  HDL 57  LDLCALC 44  TRIG 180*  CHOLHDL 2.4   Thyroid Function Tests:  Recent Labs Lab 10/27/14 0650  TSH 4.382   Coagulation:  Recent Labs Lab 10/27/14 0650  LABPROT 16.9*  INR 1.36    Micro Results: Recent Results (from the past 240 hour(s))  Blood culture (routine x 2)     Status: None (Preliminary result)   Collection Time: 10/26/14  5:59 PM  Result Value Ref Range Status   Specimen Description BLOOD ARM LEFT  Final   Special Requests BOTTLES DRAWN AEROBIC AND ANAEROBIC 5CC  Final   Culture   Final    STAPHYLOCOCCUS SPECIES Note: Gram Stain Report Called to,Read Back By and Verified With: Caesar Bookman 10/27/14 1120 BY [email or web address] Performed at [facility]    Report Status PENDING  Incomplete  Blood culture (routine x 2)     Status: None (Preliminary result)   Collection Time: 10/26/14  6:05 PM  Result Value Ref Range Status  Specimen Description BLOOD HAND RIGHT  Final   Special Requests BOTTLES DRAWN AEROBIC AND ANAEROBIC 3CC  Final   Culture   Final    STAPHYLOCOCCUS SPECIES Note: RIFAMPIN AND GENTAMICIN SHOULD NOT BE USED AS SINGLE DRUGS FOR TREATMENT OF STAPH INFECTIONS. Note: Gram Stain Report Called to,Read Back By and Verified With: Caesar Bookman 10/27/14 1120 BY SMITHERSJ Performed at [facility]    Report Status PENDING  Incomplete  MRSA PCR Screening     Status: Abnormal   Collection Time: 10/27/14  3:23 AM  Result Value Ref Range Status   MRSA by PCR POSITIVE (A) NEGATIVE Final    Comment:        The GeneXpert MRSA Assay (FDA approved for NASAL specimens only), is one component of a comprehensive MRSA colonization surveillance program. It is not intended to diagnose MRSA infection nor to guide or monitor treatment for MRSA infections. RESULT CALLED TO, READ BACK BY AND VERIFIED WITH: CALLED TO RN Junius Argyle [id number] @0600   THANEY   Culture, blood (routine x 2)     Status: None (Preliminary result)   Collection Time: 10/27/14 11:10 AM  Result Value Ref Range Status   Specimen Description BLOOD HEMODIALYSIS CATHETER  Final   Special Requests BOTTLES DRAWN AEROBIC AND ANAEROBIC 5CC  Final   Culture   Final           BLOOD CULTURE RECEIVED NO GROWTH TO DATE CULTURE WILL BE HELD FOR 5 DAYS BEFORE ISSUING A FINAL NEGATIVE REPORT Performed at [facility]    Report Status PENDING  Incomplete   Studies/Results: Ct Head Wo Contrast  10/27/2014   CLINICAL DATA:  Altered mental status  EXAM: CT HEAD WITHOUT CONTRAST  TECHNIQUE: Contiguous axial images were obtained from the base of the skull through the vertex without intravenous contrast.  COMPARISON:  None.  FINDINGS: Diffuse atrophic changes are noted. No findings to suggest acute hemorrhage, acute infarction or space-occupying mass lesion are noted. The bony calvarium is intact.  IMPRESSION: Atrophic changes without acute abnormality.   Electronically Signed   By: Alcide Clever M.D.   On: 10/27/2014 13:51   Ct Abdomen Pelvis W Contrast  10/26/2014   CLINICAL DATA:  Patient arrives via EMS from dialysis center. Patient was extremely combative had a fever and possible UTI. Generalized abdominal pain.  EXAM: CT ABDOMEN AND PELVIS WITH CONTRAST  TECHNIQUE: Multidetector CT imaging of the abdomen and pelvis was performed using the standard protocol following bolus administration of intravenous contrast.  CONTRAST:  OMNIPAQUE IOHEXOL 300 MG/ML  SOLN  COMPARISON:  CT chest abdomen and pelvis 10/05/2013  FINDINGS: Evaluation of lung bases is limited due to motion artifact. Suggestion of vascular prominence and interstitial changes probably due to edema and fluid overload.  Examination is technically limited due to motion artifact. The liver, spleen, pancreas, adrenal glands, and retroperitoneal lymph nodes appear grossly unremarkable. Gallbladder is contracted appears to  be filled with sludge or possibly small stones. No bile duct dilatation. Multi-cystic appearance of the kidneys, some with calcification and some with increased density suggesting hemorrhage. Appearance is grossly unchanged since prior study. No hydronephrosis. Calcification of the abdominal aorta without aneurysm. Right femoral approach intravenous catheter with tip in the right atrium consistent with dialysis catheter. Stomach, small bowel, and colon appear decompressed. No free air or free fluid in the abdomen. Multiple venous collateral vessels are demonstrated throughout the subcutaneous soft tissues of the abdominal wall bilaterally. Previous study demonstrate sclerosis of  the superior vena cava, likely accounting for the collaterals.  Pelvis: Stool in the rectosigmoid colon. No inflammatory changes. Bladder is decompressed. Prostate gland is not significantly enlarged. No free or loculated pelvic fluid collections. There is a fluid collection measuring 4.6 x 5 x 7.2 cm demonstrated in the right groin adjacent to vascular grafts arising from the common femoral artery. This is new since previous study and could represent developing hematoma. Alternatively, infected graft with abscess could also have this appearance. Infiltration in the subcutaneous fat consistent with edema. Mild prominence of lymph nodes in the groin regions bilaterally likely are reactive. With the mild lumbar scoliosis convex towards the left. Degenerative changes in the lumbar spine. No destructive bone lesions appreciated.  IMPRESSION: Multi-cystic appearance of both kidneys, unchanged since prior study. Multiple venous collaterals demonstrated throughout the subcutaneous soft tissues of the abdominal wall consistent with sclerosis of the superior vena cava noted on prior chest CT. Interval development of a fluid collection in the left groin region adjacent to vascular grafts. This could represent hematoma, pseudoaneurysm, or infection  related to the graft. Sludge or small stones in the gallbladder.   Electronically Signed   By: Burman Nieves M.D.   On: 10/26/2014 23:42   Dg Chest Portable 1 View  10/26/2014   CLINICAL DATA:  Altered mental status. Congestive heart failure. Hypertension.  EXAM: PORTABLE CHEST - 1 VIEW  COMPARISON:  05/09/2014  FINDINGS: Right groin tunneled dialysis catheter noted, terminating in the right atrium.  The patient is rotated to the Right on today's radiograph, reducing diagnostic sensitivity and specificity. Low lung volumes. Mild atherosclerotic calcification of the aortic arch.  The lungs appear clear.  IMPRESSION: 1. Low lung volumes.  However, the lungs appear clear. 2. Dialysis catheter tip: Right atrium.   Electronically Signed   By: Herbie Baltimore M.D.   On: 10/26/2014 18:17   Medications: I have reviewed the patient's current medications. Scheduled Meds: . atorvastatin  20 mg Oral QHS  . cefTRIAXone (ROCEPHIN)  IV  1 g Intravenous Q24H  . Chlorhexidine Gluconate Cloth  6 each Topical Q0600  . cinacalcet  30 mg Oral Q breakfast  . [START ON 11/02/2014] darbepoetin (ARANESP) injection - DIALYSIS  100 mcg Intravenous Q Thu-HD  . doxercalciferol  3 mcg Oral Q T,Th,Sa-HD  . heparin  5,000 Units Subcutaneous 3 times per day  . hydrocortisone sod succinate (SOLU-CORTEF) inj  50 mg Intravenous Q6H  . midodrine  15 mg Oral TID WC  . multivitamin  1 tablet Oral QHS  . mupirocin ointment  1 application Nasal BID  . pantoprazole  40 mg Oral Daily  . phenytoin  250 mg Oral QHS  . sevelamer carbonate  800 mg Oral TID WC  . sodium chloride  1,000 mL Intravenous Once  . vancomycin  500 mg Intravenous Q T,Th,Sa-HD   Continuous Infusions:  PRN Meds:.acetaminophen **OR** acetaminophen Assessment/Plan: Principal Problem:   Acute encephalopathy Active Problems:   Hyperlipidemia   Chronic anemia   Mental retardation   Chronic hypotension   ESRD (end stage renal disease)   Secondary renal  hyperparathyroidism   Seizure disorder   GERD (gastroesophageal reflux disease)   History of adrenal insufficiency   Sepsis due to staph bactermia - rt femoral cath site likely source of infxn. Pt received one day of zosyn.  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  -Continue home Sensipar, Renvela, Dialyvite -Nephrology following, appreciate recs. Will consider pulling catheter and re attempting placement in a few days if abx tx fails. 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   Chronic hypotension- received 2L NS in the MICU. Pt still hypotensive w/ SBP in the high 60's-70's.  -Continue home midodrine 15mg  TID - hydrocortisone 50mg  q6h - lovenox per pharm for VTE tx as ECHO revealed thrombus in the IVC as above, which could be contributing to hypotension.  - rt radial arterial cath placed   Seizure disorder: -phenytoin 250mg  qhs  Anemia of chronic disease: Hb 8.9 on admission, 8-9 during most recent hospitalization. Fe panel at that time with Fe 23, ferritin 309.  -will cont to monitor, at baseline.  GERD:  - continue home Protonix  Hyperlipidemia: -Continue home lipitor 20mg   FEN:  -Diet: SLP consulted  DVT prophylaxis: heparin 5000 units subcutaneous  CODE STATUS: -Defer to sister [name] [HCPOA] if patients lacks decision-making capacity -Confirmed on 10/28/14 that pt wants all resuscitative measures except CPR, which include intubation and pressors.   Dispo: Disposition is deferred at this time, awaiting improvement of current medical problems.   The patient does have a current PCP Courtney Paris(Eden W Jones, MD) and [facility]  [facility]charge.  The patient does have transportation limitations that hinder transportation to clinic appointments.   .Services Needed at time of discharge: Y = Yes, Blank = No PT:   OT:   RN:   Equipment:   Other:     LOS: 2 days   Gara Kroneriana Pavle Wiler, MD 10/28/2014, 1:45 PM

## 2014-10-28 NOTE — Progress Notes (Addendum)
New orders to transfer to 2M02. Austin MD at bedside. Per MD, give additional free 500 cc bolus during transport to ICU.

## 2014-10-29 DIAGNOSIS — B9562 Methicillin resistant Staphylococcus aureus infection as the cause of diseases classified elsewhere: Secondary | ICD-10-CM

## 2014-10-29 DIAGNOSIS — Z992 Dependence on renal dialysis: Secondary | ICD-10-CM

## 2014-10-29 DIAGNOSIS — R7881 Bacteremia: Secondary | ICD-10-CM | POA: Diagnosis present

## 2014-10-29 DIAGNOSIS — N186 End stage renal disease: Secondary | ICD-10-CM

## 2014-10-29 LAB — CULTURE, BLOOD (ROUTINE X 2)

## 2014-10-29 NOTE — Progress Notes (Signed)
Patient ID: Rowe Clackeal W Fobes, male   DOB: [birth year], 64 y.o.   MRN: [id number] Medicine attending: I examined this patient today together with the medical resident team and I attest to the accuracy of the evaluation and management plan as outlined by resident physician Dr. Mariea ClontsEmokpae. We appreciate infectious disease consultation. We will follow recommendation and obtain vascular surgery consultation. Blood cultures drawn on January 7 showing that this is MRSA. Clinically he has stabilized on vancomycin which will be continued. Zosyn will be discontinued. Full dose, dose adjusted, low molecular weight heparin, started yesterday when echocardiogram showed IVC thrombus. If feasible and he is able to tolerate by mouth, we will ultimately change him to warfarin after a minimum of 5 days of the Lovenox.

## 2014-10-29 NOTE — Progress Notes (Signed)
Subjective:   Nonverbal- appears comfortable  Objective Filed Vitals:   10/28/14 1300 10/28/14 1347 10/28/14 2031 10/29/14 0436  BP: 75/48 68/53 85/58  71/43  Pulse: 79 80 88 98  Temp:  97.8 F (36.6 C) 97.4 F (36.3 C) 97.7 F (36.5 C)  TempSrc: Axillary Oral Oral Oral  Resp: 10  15 16   Height:      Weight:   60.328 kg (133 lb)   SpO2: 97% 98% 95% 96%   Physical Exam General: Awake. Nonverbal at baseline. No acute distress.  Heart: RRR Lungs: CTA anterior. Shallow/unlabored. Abdomen: soft, nontender +BS  Extremities: no edema Dialysis Access:  R fem cath, no drainage  Dialysis Orders: [facility] . EDW 57kg 2.o 2.oca HD Time 3hr 45min Heparin 3500. Access r fem Perm cath/ Hectorol 3 mcg IV/HD Aranesp 100mcg qweeklyUnits IV/HD Venofer 100mcg load stop 11/07/14   Assessment/Plan 1. Sepsis. Blood cultures. + staph. (etiology prob sec to Recurrent permcath ) on vanc and rocephin 2. 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 Will need plan for long term anticoagulation after DC 3. ESRD - HD TTS k 4.6- next HD Tuesday 4. Hypertension/volume - SBP 70s/80s. On Midodrine / no volume excess 5. Anemia - hgb 8.5 on esa/ fe 6. Metabolic bone disease - Ca 8.2 phos 5.8 continue binders( Renvela) and vit d and sensipar 7. End stage HD Access- Need to dw POA situation with recurrent infections/ and stand of living 8. Hypotension on Midodrine 15mg  tid 9. HO sx Disorder- per admit eeg 10. MR- at baseline  Jetty DuhamelBridget Whelan, NP [facility] [phone] 10/29/2014,9:27 AM  LOS: 3 days   I have seen and examined this patient and agree with plan per Jetty DuhamelBridget Whelan.  He appears to be at baseline mentally.  Exit sit sl less indurated.  He is afebrile.  I think the ceftriaxone could be DC and just cont the vancomycin.  Could also add rifampin for at least a couple weeks at 300mg  BID. Cherisse Carrell T,MD 10/29/2014 9:51 AM Additional  Objective Labs: Basic Metabolic Panel:  Recent Labs Lab 10/26/14 1759 10/27/14 0650 10/28/14 0730  NA 139  --  140  K 3.6  --  4.6  CL 99  --  103  CO2 27  --  19  GLUCOSE 101*  --  159*  BUN 12  --  39*  CREATININE 4.36*  --  8.24*  CALCIUM 8.5  --  8.2*  PHOS  --  5.8*  --    Liver Function Tests: No results for input(s): AST, ALT, ALKPHOS, BILITOT, PROT, ALBUMIN in the last 168 hours. No results for input(s): LIPASE, AMYLASE in the last 168 hours. CBC:  Recent Labs Lab 10/26/14 1759 10/27/14 0620 10/28/14 0730  WBC 9.4 8.1 6.7  NEUTROABS 8.1*  --   --   HGB 8.9* 8.5* 8.5*  HCT 29.0* 27.7* 28.1*  MCV 97.3 97.5 96.6  PLT 163 183 172   Blood Culture    Component Value Date/Time   SDES BLOOD HEMODIALYSIS CATHETER 10/27/2014 1110   SPECREQUEST BOTTLES DRAWN AEROBIC AND ANAEROBIC 5CC 10/27/2014 1110   CULT  10/27/2014 1110           BLOOD CULTURE RECEIVED NO GROWTH TO DATE CULTURE WILL BE HELD FOR 5 DAYS BEFORE ISSUING A FINAL NEGATIVE REPORT Performed at [facility]    REPTSTATUS PENDING 10/27/2014 1110    Cardiac Enzymes:  Recent Labs Lab 10/27/14 0650  TROPONINI 0.03  CBG:  Recent Labs Lab 10/28/14 0211  GLUCAP 124*   Iron Studies: No results for input(s): IRON, TIBC, TRANSFERRIN, FERRITIN in the last 72 hours. @ Studies/Results: Ct Head Wo Contrast  10/27/2014   CLINICAL DATA:  Altered mental status  EXAM: CT HEAD WITHOUT CONTRAST  TECHNIQUE: Contiguous axial images were obtained from the base of the skull through the vertex without intravenous contrast.  COMPARISON:  None.  FINDINGS: Diffuse atrophic changes are noted. No findings to suggest acute hemorrhage, acute infarction or space-occupying mass lesion are noted. The bony calvarium is intact.  IMPRESSION: Atrophic changes without acute abnormality.   Electronically Signed   By: Alcide Clever M.D.   On: 10/27/2014 13:51   Medications:   . atorvastatin  20 mg Oral QHS  .  cefTRIAXone (ROCEPHIN)  IV  1 g Intravenous Q24H  . Chlorhexidine Gluconate Cloth  6 each Topical Q0600  . cinacalcet  30 mg Oral Q breakfast  . [START ON 11/02/2014] darbepoetin (ARANESP) injection - DIALYSIS  100 mcg Intravenous Q Thu-HD  . doxercalciferol  3 mcg Oral Q T,Th,Sa-HD  . enoxaparin (LOVENOX) injection  60 mg Subcutaneous Q24H  . hydrocortisone sod succinate (SOLU-CORTEF) inj  50 mg Intravenous Q6H  . midodrine  15 mg Oral TID WC  . multivitamin  1 tablet Oral QHS  . mupirocin ointment  1 application Nasal BID  . pantoprazole  40 mg Oral Daily  . phenytoin  250 mg Oral QHS  . sevelamer carbonate  800 mg Oral TID WC  . sodium chloride  1,000 mL Intravenous Once  . vancomycin  500 mg Intravenous Q T,Th,Sa-HD

## 2014-10-29 NOTE — Progress Notes (Signed)
CRITICAL VALUE ALERT  Critical value received:  [facility]  Date of notification:  1/10  Time of notification:  0945  Critical value read back:Yes.    Nurse who received alert:  Carrie MewJasmine Duana Benedict  MD notified (1st page):     Time of first page:  0945  Responding MD:       Time MD responded:  [phone]

## 2014-10-29 NOTE — Progress Notes (Signed)
Subjective: Awake, lying in bed, obeys few commands, but non verbal.   Objective: Vital signs in last 24 hours: Filed Vitals:   10/28/14 1347 10/28/14 2031 10/29/14 0436 10/29/14 1054  BP: 95/53  Pulse: 80 88 98 100  Temp: 97.8 F (36.6 C) 97.4 F (36.3 C) 97.7 F (36.5 C) 98.3 F (36.8 C)  TempSrc: Oral Oral Oral Oral  Resp:  Height:      Weight:  133 lb (60.328 kg)    SpO2: 98% 95% 96% 100%   Weight change: 7 lb 7.9 oz (3.4 kg)  Intake/Output Summary (Last 24 hours) at 10/29/14 1111 Last data filed at 10/29/14 0900  Gross per 24 hour  Intake      0 ml  Output      0 ml  Net      0 ml   General: NAD HEENT:AT, [place].  Lungs: clear on anterior auscultation Cardiac: RRR, no added sounds  GI: active bowel sounds, non tender to palpation, soft Ext: no pedal edema  Lab Results: Basic Metabolic Panel:  Recent Labs Lab 10/26/14 1759 10/27/14 0650 10/28/14 0730  NA 139  --  140  K 3.6  --  4.6  CL 99  --  103  CO2 27  --  19  GLUCOSE 101*  --  159*  BUN 12  --  39*  CREATININE 4.36*  --  8.24*  CALCIUM 8.5  --  8.2*  MG  --  2.0  --   PHOS  --  5.8*  --    CBC:  Recent Labs Lab 10/26/14 1759 10/27/14 0620 10/28/14 0730  WBC 9.4 8.1 6.7  NEUTROABS 8.1*  --   --   HGB 8.9* 8.5* 8.5*  HCT 29.0* 27.7* 28.1*  MCV 97.3 97.5 96.6  PLT 163 183 172   Cardiac Enzymes:  Recent Labs Lab 10/27/14 0650  TROPONINI 0.03   CBG  Recent Labs Lab 10/28/14 0211  GLUCAP 124*   Fasting Lipid Panel:  Recent Labs Lab 10/27/14 0650  CHOL 137  HDL 57  LDLCALC 44  TRIG 180*  CHOLHDL 2.4   Thyroid Function Tests:  Recent Labs Lab 10/27/14 0650  TSH 4.382   Coagulation:  Recent Labs Lab 10/27/14 0650  LABPROT 16.9*  INR 1.36    Micro Results: Recent Results (from the past 240 hour(s))  Blood culture (routine x 2)     Status: None   Collection Time: 10/26/14  5:59 PM  Result Value Ref Range Status   Specimen  Description BLOOD ARM LEFT  Final   Special Requests BOTTLES DRAWN AEROBIC AND ANAEROBIC 5CC  Final   Culture   Final    METHICILLIN RESISTANT STAPHYLOCOCCUS AUREUS Note: SUSCEPTIBILITIES PERFORMED ON PREVIOUS CULTURE WITHIN THE LAST 5 DAYS. Note: Gram Stain Report Called to,Read Back By and Verified With: Caesar Bookman 10/27/14 1120 BY SMITHERSJ Performed at [facility]    Report Status 10/29/2014 FINAL  Final  Blood culture (routine x 2)     Status: None   Collection Time: 10/26/14  6:05 PM  Result Value Ref Range Status   Specimen Description BLOOD HAND RIGHT  Final   Special Requests BOTTLES DRAWN AEROBIC AND ANAEROBIC 3CC  Final   Culture   Final    METHICILLIN RESISTANT STAPHYLOCOCCUS AUREUS Note: RIFAMPIN AND GENTAMICIN SHOULD NOT BE USED AS SINGLE DRUGS FOR TREATMENT OF STAPH INFECTIONS. This organism DOES NOT demonstrate inducible Clindamycin resistance  in vitro. CRITICAL RESULT CALLED TO, READ BACK BY AND VERIFIED WITH: JASMINE ADAMS  10/29/14 @ 9:41AM BY RUSCOE A. Note: Gram Stain Report Called to,Read Back By and Verified With: Caesar BookmanKATHY WIECKER 10/27/14 1120 BY SMITHERSJ Performed at [facility]    Report Status 10/29/2014 FINAL  Final   Organism ID, Bacteria METHICILLIN RESISTANT STAPHYLOCOCCUS AUREUS  Final      Susceptibility   Methicillin resistant staphylococcus aureus - MIC*    CLINDAMYCIN <=0.25 SENSITIVE Sensitive     ERYTHROMYCIN >=8 RESISTANT Resistant     GENTAMICIN <=0.5 SENSITIVE Sensitive     LEVOFLOXACIN <=0.12 SENSITIVE Sensitive     OXACILLIN >=4 RESISTANT Resistant     PENICILLIN >=0.5 RESISTANT Resistant     RIFAMPIN <=0.5 SENSITIVE Sensitive     TRIMETH/SULFA <=10 SENSITIVE Sensitive     VANCOMYCIN 1 SENSITIVE Sensitive     TETRACYCLINE <=1 SENSITIVE Sensitive     * METHICILLIN RESISTANT STAPHYLOCOCCUS AUREUS  MRSA PCR Screening     Status: Abnormal   Collection Time: 10/27/14  3:23 AM  Result Value Ref Range Status   MRSA by PCR  POSITIVE (A) NEGATIVE Final    Comment:        The GeneXpert MRSA Assay (FDA approved for NASAL specimens only), is one component of a comprehensive MRSA colonization surveillance program. It is not intended to diagnose MRSA infection nor to guide or monitor treatment for MRSA infections. RESULT CALLED TO, READ BACK BY AND VERIFIED WITH: CALLED TO RN Junius ArgyleJANICE TEMBRINA [phone] @0600  THANEY   Culture, blood (routine x 2)     Status: None (Preliminary result)   Collection Time: 10/27/14 11:10 AM  Result Value Ref Range Status   Specimen Description BLOOD HEMODIALYSIS CATHETER  Final   Special Requests BOTTLES DRAWN AEROBIC AND ANAEROBIC 5CC  Final   Culture   Final           BLOOD CULTURE RECEIVED NO GROWTH TO DATE CULTURE WILL BE HELD FOR 5 DAYS BEFORE ISSUING A FINAL NEGATIVE REPORT Performed at [facility]    Report Status PENDING  Incomplete   Studies/Results: Ct Head Wo Contrast  10/27/2014   CLINICAL DATA:  Altered mental status  EXAM: CT HEAD WITHOUT CONTRAST  TECHNIQUE: Contiguous axial images were obtained from the base of the skull through the vertex without intravenous contrast.  COMPARISON:  None.  FINDINGS: Diffuse atrophic changes are noted. No findings to suggest acute hemorrhage, acute infarction or space-occupying mass lesion are noted. The bony calvarium is intact.  IMPRESSION: Atrophic changes without acute abnormality.   Electronically Signed   By: Alcide CleverMark  Lukens M.D.   On: 10/27/2014 13:51   Medications: I have reviewed the patient's current medications. Scheduled Meds: . atorvastatin  20 mg Oral QHS  . Chlorhexidine Gluconate Cloth  6 each Topical Q0600  . cinacalcet  30 mg Oral Q breakfast  . [START ON 11/02/2014] darbepoetin (ARANESP) injection - DIALYSIS  100 mcg Intravenous Q Thu-HD  . doxercalciferol  3 mcg Oral Q T,Th,Sa-HD  . enoxaparin (LOVENOX) injection  60 mg Subcutaneous Q24H  . hydrocortisone sod succinate (SOLU-CORTEF) inj  50 mg Intravenous Q6H   . midodrine  15 mg Oral TID WC  . multivitamin  1 tablet Oral QHS  . mupirocin ointment  1 application Nasal BID  . pantoprazole  40 mg Oral Daily  . phenytoin  250 mg Oral QHS  . sevelamer carbonate  800 mg Oral TID WC  . sodium chloride  1,000 mL Intravenous Once  . vancomycin  500 mg Intravenous Q T,Th,Sa-HD   Continuous Infusions:  PRN Meds:.acetaminophen **OR** acetaminophen Assessment/Plan: Principal Problem:   MRSA bacteremia Active Problems:   Hyperlipidemia   Chronic anemia   Mental retardation   Chronic hypotension   ESRD (end stage renal disease)   Secondary renal hyperparathyroidism   Seizure disorder   Acute encephalopathy   GERD (gastroesophageal reflux disease)   History of adrenal insufficiency   VTE (venous thromboembolism)   Sepsis due to Staphylococcus aureus  Sepsis due to staph bactermia - rt femoral cath site likely source of infxn, but blood cultures drawn from that site, after antibiotics started, no growth till date.  - Day 2 of antibiotics - Day 2 Vanc, d/c Zosyn today. 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  - On lovenox per pharm for VTE treatment- on  daily presently, duration likely indefinite, considering patient mostly is immobile.  - Considering Coumadin for long term anticoagulation, considering frequent INR checks, this would have to be co-ordinated between his dialysis center (- if INR could be checked) and his PCP so that dose adjustments can be made.   ESRD on HD: T/H/Sa schedule.  -Continue home Sensipar, Renvela, Dialyvite -Nephrology following, appreciate recs. Will consider pulling catheter and re attempting placement in a few days if abx tx fails. 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   Chronic hypotension-  Asymptomatic. Bp sometimes as low as 60s systolic, pt completely asymptomatic, without tachycardia- Hr/pulse- 80s, not on BB.  Arterial line placed in ICU showed BP  10-20 mmhg greater than what Bp cuff is reading.  -Continue home midodrine  TID - hydrocortisone  q6h - rt radial arterial cath placed   Seizure disorder: EEG- 10/27/2013- NGTD. -phenytoin  qhs  Anemia of chronic disease, with ESRD: Stable- 8.5 today. Baseline- 8-9.  -will cont to monitor, at baseline.  GERD:  - continue home Protonix  Hyperlipidemia: -Continue home lipitor   FEN:  -Diet: SLP consulted  DVT prophylaxis: heparin 5000 units subcutaneous  CODE STATUS: -Defer to sister Sherryle Lis if patients lacks decision-making capacity -Confirmed on 10/28/14 that pt wants all resuscitative measures except CPR, which include intubation and pressors.   Dispo: Disposition is deferred at this time, awaiting improvement of current medical problems.    LOS: 3 days   Onnie Boer, MD 10/29/2014, 11:11 AM

## 2014-10-29 NOTE — Consult Note (Signed)
[facility]    Date of Admission:  10/26/2014    Total days of antibiotics 3               Reason for Consult: Automatic consultation for staph aureus bacteremia      Principal Problem:   MRSA bacteremia Active Problems:   Sepsis due to Staphylococcus aureus   Hyperlipidemia   Chronic anemia   Mental retardation   Chronic hypotension   ESRD (end stage renal disease)   Secondary renal hyperparathyroidism   Seizure disorder   Acute encephalopathy   GERD (gastroesophageal reflux disease)   History of adrenal insufficiency   VTE (venous thromboembolism)   . atorvastatin  20 mg Oral QHS  . Chlorhexidine Gluconate Cloth  6 each Topical Q0600  . cinacalcet  30 mg Oral Q breakfast  . [START ON 11/02/2014] darbepoetin (ARANESP) injection - DIALYSIS  100 mcg Intravenous Q Thu-HD  . doxercalciferol  3 mcg Oral Q T,Th,Sa-HD  . enoxaparin (LOVENOX) injection  60 mg Subcutaneous Q24H  . hydrocortisone sod succinate (SOLU-CORTEF) inj  50 mg Intravenous Q6H  . midodrine  15 mg Oral TID WC  . multivitamin  1 tablet Oral QHS  . mupirocin ointment  1 application Nasal BID  . pantoprazole  40 mg Oral Daily  . phenytoin  250 mg Oral QHS  . sevelamer carbonate  800 mg Oral TID WC  . sodium chloride  1,000 mL Intravenous Once  . vancomycin  500 mg Intravenous Q T,Th,Sa-HD    Recommendations: 1. Continue vancomycin  2. Monitor results of repeat blood cultures 3. Vascular surgery evaluation tomorrow  Assessment: He has MRSA bacteremia. His right groin catheter is certainly the most likely source but his CT scan also reveals a new fluid collection overlying a portion of his left groin Dacron graft. I do not see any obvious evidence of cellulitis or abscess in the left groin but would recommend evaluation by vascular surgery. I note that when he had surgery last summer a "seroma" overlying the graft was aspirated. So far repeat blood cultures are negative  and there is no evidence of endocarditis by transthoracic echocardiogram. If there is any evidence of abscess over the graft I would add rifampin. He can consider transesophageal echocardiogram if it will alter his management. Ideally he will need to have his hemodialysis catheter replaced but his records indicate that options for alternative access may be very limited.    HPI: [name] is a 64 y.o. male with end-stage renal disease who became agitated and combative at hemodialysis leading to admission on 10/26/2014. Both admission blood cultures have grown MRSA. Repeat blood cultures are negative so far.   Review of Systems: Review of systems not obtained due to patient factors.  Past Medical History  Diagnosis Date  . Heart murmur, systolic 08/10/2009  . Syncope 05/07/2009  . 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 Esophageal varices 11/07/2008  . Gastric ulcer 10/09    antral, with h pylori positive  . Congestive heart failure 03/06/2008  . Cellulitis and abscess of leg, except foot 03/06/2008  . Secondary hyperparathyroidism 02/02/2008  . [place] 02/02/2008  . Hyperlipidemia 02/02/2008  . Anemia 02/02/2008  . ESRD (end stage renal disease)     TTS hemodialysis  . GERD (gastroesophageal reflux disease)   . Hypertension 02/02/2008    in history  . Mental retardation 02/02/2008  . [place]   . Seizures     "  non in a while at home". none in past year .  Marland Kitchen. Complication of anesthesia     in the past BP has dropped   . Sepsis due to Pseudomonas species 05/05/2014    History  Substance Use Topics  . Smoking status: Never Smoker   . Smokeless tobacco: Never Used  . Alcohol Use: No    History reviewed. No pertinent family history. No Known Allergies  OBJECTIVE: Blood pressure 95/53, pulse 100, temperature 98.3 F (36.8 C), temperature source Oral, resp. rate 18, height 5' (1.524 m), weight 133 lb (60.328 kg), SpO2 100 %. General: He is alert but nonverbal Skin: No rash,  splinter or conjunctival hemorrhages Lungs: Clear Cor: Distant heart sounds. Tachycardic but regular S1 and S2 with no murmur heard Abdomen: Soft and not obviously tender Hemodialysis catheter in right groin without obvious inflammation. Thrombosed AV graft in left thigh. Firm mass in left groin without overlying inflammation.  Lab Results Lab Results  Component Value Date   WBC 6.7 10/28/2014   HGB 8.5* 10/28/2014   HCT 28.1* 10/28/2014   MCV 96.6 10/28/2014   PLT 172 10/28/2014    Lab Results  Component Value Date   CREATININE 8.24* 10/28/2014   BUN 39* 10/28/2014   NA 140 10/28/2014   K 4.6 10/28/2014   CL 103 10/28/2014   CO2 19 10/28/2014    Lab Results  Component Value Date   ALT <5 05/02/2014   AST 28 05/02/2014   ALKPHOS 125* 05/02/2014   BILITOT 0.8 05/02/2014     Microbiology: Recent Results (from the past 240 hour(s))  Blood culture (routine x 2)     Status: None   Collection Time: 10/26/14  5:59 PM  Result Value Ref Range Status   Specimen Description BLOOD ARM LEFT  Final   Special Requests BOTTLES DRAWN AEROBIC AND ANAEROBIC 5CC  Final   Culture   Final    METHICILLIN RESISTANT STAPHYLOCOCCUS AUREUS Note: SUSCEPTIBILITIES PERFORMED ON PREVIOUS CULTURE WITHIN THE LAST 5 DAYS. Note: Gram Stain Report Called to,Read Back By and Verified With: Caesar BookmanKATHY WIECKER 10/27/14 1120 BY SMITHERSJ Performed at [facility]    Report Status 10/29/2014 FINAL  Final  Blood culture (routine x 2)     Status: None   Collection Time: 10/26/14  6:05 PM  Result Value Ref Range Status   Specimen Description BLOOD HAND RIGHT  Final   Special Requests BOTTLES DRAWN AEROBIC AND ANAEROBIC 3CC  Final   Culture   Final    METHICILLIN RESISTANT STAPHYLOCOCCUS AUREUS Note: RIFAMPIN AND GENTAMICIN SHOULD NOT BE USED AS SINGLE DRUGS FOR TREATMENT OF STAPH INFECTIONS. This organism DOES NOT demonstrate inducible Clindamycin resistance in vitro. CRITICAL RESULT CALLED TO, READ BACK  BY AND VERIFIED WITH: JASMINE ADAMS  10/29/14 @ 9:41AM BY RUSCOE A. Note: Gram Stain Report Called to,Read Back By and Verified With: Caesar BookmanKATHY WIECKER 10/27/14 1120 BY SMITHERSJ Performed at [facility]    Report Status 10/29/2014 FINAL  Final   Organism ID, Bacteria METHICILLIN RESISTANT STAPHYLOCOCCUS AUREUS  Final      Susceptibility   Methicillin resistant staphylococcus aureus - MIC*    CLINDAMYCIN <=0.25 SENSITIVE Sensitive     ERYTHROMYCIN >=8 RESISTANT Resistant     GENTAMICIN <=0.5 SENSITIVE Sensitive     LEVOFLOXACIN <=0.12 SENSITIVE Sensitive     OXACILLIN >=4 RESISTANT Resistant     PENICILLIN >=0.5 RESISTANT Resistant     RIFAMPIN <=0.5 SENSITIVE Sensitive     TRIMETH/SULFA <=10  SENSITIVE Sensitive     VANCOMYCIN 1 SENSITIVE Sensitive     TETRACYCLINE <=1 SENSITIVE Sensitive     * METHICILLIN RESISTANT STAPHYLOCOCCUS AUREUS  MRSA PCR Screening     Status: Abnormal   Collection Time: 10/27/14  3:23 AM  Result Value Ref Range Status   MRSA by PCR POSITIVE (A) NEGATIVE Final    Comment:        The GeneXpert MRSA Assay (FDA approved for NASAL specimens only), is one component of a comprehensive MRSA colonization surveillance program. It is not intended to diagnose MRSA infection nor to guide or monitor treatment for MRSA infections. RESULT CALLED TO, READ BACK BY AND VERIFIED WITH: CALLED TO RN Junius Argyle [phone]  THANEY   Culture, blood (routine x 2)     Status: None (Preliminary result)   Collection Time: 10/27/14 11:10 AM  Result Value Ref Range Status   Specimen Description BLOOD HEMODIALYSIS CATHETER  Final   Special Requests BOTTLES DRAWN AEROBIC AND ANAEROBIC 5CC  Final   Culture   Final           BLOOD CULTURE RECEIVED NO GROWTH TO DATE CULTURE WILL BE HELD FOR 5 DAYS BEFORE ISSUING A FINAL NEGATIVE REPORT Performed at [facility]    Report Status PENDING  Incomplete    Cliffton Asters, MD [facility] [phone] pager   [phone] cell 10/29/2014, 11:59 AM

## 2014-10-29 NOTE — Progress Notes (Signed)
SLP Cancellation Note  Patient Details Name: [name] MRN: [id number] DOB: Jun 05, 1951   Cancelled treatment:       Reason Eval/Treat Not Completed: Patient declined, no reason specified. Patient pushing SLP hands away, pushing away both liquids and solids in attempts to provide diagnostic treatment. MD, may wish to consider initiating diet as specified by initial bedside swallow eval 1/8 (dysphagia 1/pureed solids, no liquids) to maximize opportunities through out the day for patient to be agreeable to po intake. Will continue efforts.  [name] LangoLeah Latavia Goga MA, CCC-SLP [phone]   [name] [name] 10/29/2014, 9:20 AM

## 2014-10-30 DIAGNOSIS — T82898A Other specified complication of vascular prosthetic devices, implants and grafts, initial encounter: Secondary | ICD-10-CM

## 2014-10-30 DIAGNOSIS — I82220 Acute embolism and thrombosis of inferior vena cava: Secondary | ICD-10-CM

## 2014-10-30 DIAGNOSIS — I5032 Chronic diastolic (congestive) heart failure: Secondary | ICD-10-CM | POA: Diagnosis present

## 2014-10-30 DIAGNOSIS — R569 Unspecified convulsions: Secondary | ICD-10-CM

## 2014-10-30 DIAGNOSIS — D638 Anemia in other chronic diseases classified elsewhere: Secondary | ICD-10-CM

## 2014-10-30 DIAGNOSIS — I513 Intracardiac thrombosis, not elsewhere classified: Secondary | ICD-10-CM | POA: Insufficient documentation

## 2014-10-30 NOTE — Progress Notes (Signed)
RN from IV team attempted IV restart but pt unwilling to cooperate once bleeding occurred. Caregiver was present during attempt in hopes to calm pt; pt still uncooperative. Made Dr. Allena KatzPatel aware that pt still does not have IV access and that pt has missed all doses of solu-cortef for today.

## 2014-10-30 NOTE — Progress Notes (Signed)
ANTIBIOTIC CONSULT NOTE - FOLLOW UP  Pharmacy Consult for Vancomycin Indication: MRSA bacteremia  No Known Allergies  Patient Measurements: Height: 5' (152.4 cm) Weight: 133 lb 9.6 oz (60.6 kg) IBW/kg (Calculated) : 50  Vital Signs: Temp: 98.6 F (37 C) (01/11 0602) Temp Source: Oral (01/11 0602) BP: 98/67 mmHg (01/11 0602) Pulse Rate: 95 (01/11 0602) Intake/Output from previous day:   Intake/Output from this shift:    Labs:  Recent Labs  10/28/14 0730  WBC 6.7  HGB 8.5*  PLT 172  CREATININE 8.24*   Estimated Creatinine Clearance: 6.9 mL/min (by C-G formula based on Cr of 8.24).    Microbiology: Recent Results (from the past 720 hour(s))  Blood culture (routine x 2)     Status: None   Collection Time: 10/26/14  5:59 PM  Result Value Ref Range Status   Specimen Description BLOOD ARM LEFT  Final   Special Requests BOTTLES DRAWN AEROBIC AND ANAEROBIC 5CC  Final   Culture   Final    METHICILLIN RESISTANT STAPHYLOCOCCUS AUREUS Note: SUSCEPTIBILITIES PERFORMED ON PREVIOUS CULTURE WITHIN THE LAST 5 DAYS. Note: Gram Stain Report Called to,Read Back By and Verified With: Caesar BookmanKATHY WIECKER 10/27/14 1120 BY SMITHERSJ Performed at [facility]    Report Status 10/29/2014 FINAL  Final  Blood culture (routine x 2)     Status: None   Collection Time: 10/26/14  6:05 PM  Result Value Ref Range Status   Specimen Description BLOOD HAND RIGHT  Final   Special Requests BOTTLES DRAWN AEROBIC AND ANAEROBIC 3CC  Final   Culture   Final    METHICILLIN RESISTANT STAPHYLOCOCCUS AUREUS Note: RIFAMPIN AND GENTAMICIN SHOULD NOT BE USED AS SINGLE DRUGS FOR TREATMENT OF STAPH INFECTIONS. This organism DOES NOT demonstrate inducible Clindamycin resistance in vitro. CRITICAL RESULT CALLED TO, READ BACK BY AND VERIFIED WITH: JASMINE ADAMS  10/29/14 @ 9:41AM BY RUSCOE A. Note: Gram Stain Report Called to,Read Back By and Verified With: Caesar BookmanKATHY WIECKER 10/27/14 1120 BY SMITHERSJ Performed at  [facility]    Report Status 10/29/2014 FINAL  Final   Organism ID, Bacteria METHICILLIN RESISTANT STAPHYLOCOCCUS AUREUS  Final      Susceptibility   Methicillin resistant staphylococcus aureus - MIC*    CLINDAMYCIN <=0.25 SENSITIVE Sensitive     ERYTHROMYCIN >=8 RESISTANT Resistant     GENTAMICIN <=0.5 SENSITIVE Sensitive     LEVOFLOXACIN <=0.12 SENSITIVE Sensitive     OXACILLIN >=4 RESISTANT Resistant     PENICILLIN >=0.5 RESISTANT Resistant     RIFAMPIN <=0.5 SENSITIVE Sensitive     TRIMETH/SULFA <=10 SENSITIVE Sensitive     VANCOMYCIN 1 SENSITIVE Sensitive     TETRACYCLINE <=1 SENSITIVE Sensitive     * METHICILLIN RESISTANT STAPHYLOCOCCUS AUREUS  MRSA PCR Screening     Status: Abnormal   Collection Time: 10/27/14  3:23 AM  Result Value Ref Range Status   MRSA by PCR POSITIVE (A) NEGATIVE Final    Comment:        The GeneXpert MRSA Assay (FDA approved for NASAL specimens only), is one component of a comprehensive MRSA colonization surveillance program. It is not intended to diagnose MRSA infection nor to guide or monitor treatment for MRSA infections. RESULT CALLED TO, READ BACK BY AND VERIFIED WITH: CALLED TO RN Liborio NixonJANICE TEMBRINA [phone] @0600  THANEY   Culture, blood (routine x 2)     Status: None (Preliminary result)   Collection Time: 10/27/14 11:10 AM  Result Value Ref Range Status   Specimen  Description BLOOD HEMODIALYSIS CATHETER  Final   Special Requests BOTTLES DRAWN AEROBIC AND ANAEROBIC 5CC  Final   Culture   Final           BLOOD CULTURE RECEIVED NO GROWTH TO DATE CULTURE WILL BE HELD FOR 5 DAYS BEFORE ISSUING A FINAL NEGATIVE REPORT Performed at [facility]    Report Status PENDING  Incomplete    Anti-infectives    Start     Dose/Rate Route Frequency Ordered Stop   10/28/14 1200  vancomycin (VANCOCIN) 500 mg in sodium chloride 0.9 % 100 mL IVPB     500 mg100 mL/hr over 60 Minutes Intravenous Every T-Th-Sa (Hemodialysis) 10/27/14 0625      10/28/14 1200  cefTRIAXone (ROCEPHIN) 1 g in dextrose 5 % 50 mL IVPB - Premix  Status:  Discontinued     1 g100 mL/hr over 30 Minutes Intravenous Every 24 hours 10/28/14 1140 10/29/14 1109   10/27/14 0630  vancomycin (VANCOCIN) 1,250 mg in sodium chloride 0.9 % 250 mL IVPB     1,250 mg166.7 mL/hr over 90 Minutes Intravenous  Once 10/27/14 0625 10/27/14 0944   10/27/14 0630  piperacillin-tazobactam (ZOSYN) IVPB 2.25 g  Status:  Discontinued     2.25 g100 mL/hr over 30 Minutes Intravenous 3 times per day 10/27/14 0625 10/28/14 1140      Assessment: 64 yo M continues on Vancomycin day #4 of therapy for MRSA bacteremia.  Pt is afebrile, WBC wnl, repeat blood cx ngtd.  Pt is tolerating dialysis with last session 1/9 for 3.75hrs.  Goal of Therapy:   Pre-HD Vancomycin level 15-25 mcg/ml  Plan:  Continue Vancomycin  IV with each HD session.  [employer], Pharm.D., BCPS Clinical Pharmacist Pager [phone] 10/30/2014 1:36 PM

## 2014-10-30 NOTE — Progress Notes (Signed)
Occupational Therapy Treatment Patient Details Name: Rowe Clackeal W Newstrom MRN: [id number] DOB: 1951-07-27 Today's Date: 10/30/2014    History of present illness Pt admitted after HD with fever, AMS and agitation, recent diarrhea and indication of abdominal pain. 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   OT comments  Pt resistant to OOB activity, ambulation and sitting up in chair, although was able to walk around the bed with +2 assist.  Refused to eat.  Total assist for pericare.  Pt may do better when his caregiver is available.  Follow Up Recommendations  Home health OT;Supervision/Assistance - 24 hour    Equipment Recommendations  None recommended by OT    Recommendations for Other Services      Precautions / Restrictions Precautions Precautions: Fall Precaution Comments: Very impulsive, Significant fall risk Restrictions Weight Bearing Restrictions: No       Mobility Bed Mobility Overal bed mobility: Needs Assistance;+2 for physical assistance Bed Mobility: Supine to Sit;Sit to Supine     Supine to sit: +2 for physical assistance;Total assist Sit to supine: +2 for physical assistance;Total assist   General bed mobility comments: Pt continues to be resistive to all movements, therefore requires total A of +2 for bed mobility during session.   Transfers Overall transfer level: Needs assistance Equipment used: 2 person hand held assist Transfers: Sit to/from Stand Sit to Stand: Max assist;+2 physical assistance         General transfer comment: Pt requires +2 total A for safety to get pt into standing due to posterior resistance.     Balance Overall balance assessment: Needs assistance Sitting-balance support: Feet supported;Bilateral upper extremity supported Sitting balance-Leahy Scale: Zero Sitting balance - Comments: Requires up to max to +2 for sitting balance due to resistance with posterior lean.   Postural control: Posterior lean Standing balance support: During functional activity;Bilateral upper extremity supported Standing balance-Leahy Scale: Zero Standing balance comment: Requires +2 assist to total A to maintain standing.                    ADL Overall ADL's : Needs assistance/impaired Eating/Feeding:  (tray in room, but pt refusing to eat)               Upper Body Dressing : Maximal assistance;Bed level   Lower Body Dressing: Maximal assistance;Bed level;Sitting/lateral leans Lower Body Dressing Details (indicate cue type and reason): donned socks with pt resisting, pt removed once seated in chair     Toileting- Clothing Manipulation and Hygiene: Total assistance;+2 for physical assistance;Bed level;Sit to/from stand Toileting - Clothing Manipulation Details (indicate cue type and reason): performed pericare in standing and completed in bed     Functional mobility during ADLs: +2 for physical assistance;Maximal assistance        Vision                     Perception     Praxis      Cognition   Behavior During Therapy: Flat affect Overall Cognitive Status: History of cognitive impairments - at baseline                       Extremity/Trunk Assessment               Exercises     Shoulder Instructions       General Comments      Pertinent Vitals/ Pain  Pain Assessment: No/denies pain  Home Living                                          Prior Functioning/Environment              Frequency Min 2X/week     Progress Toward Goals  OT Goals(current goals can now be found in the care plan section)  Progress towards OT goals: Progressing toward goals  Acute Rehab OT Goals Patient Stated Goal: Pt unable to state.  Plan Discharge plan remains appropriate    Co-evaluation    PT/OT/SLP Co-Evaluation/Treatment: Yes Reason for Co-Treatment: For patient/therapist safety;Necessary to  address cognition/behavior during functional activity PT goals addressed during session: Mobility/safety with mobility;Balance OT goals addressed during session: Strengthening/ROM      End of Session     Activity Tolerance Treatment limited secondary to agitation   Patient Left in bed;with bed alarm set   Nurse Communication          Time: 0454-0981 OT Time Calculation (min): 29 min  Charges: OT General Charges $OT Visit: 1 Procedure OT Treatments $Therapeutic Activity: 8-22 mins  Evern Bio 10/30/2014, 11:19 AM  [phone]

## 2014-10-30 NOTE — Discharge Summary (Signed)
Name: [name] MRN: [id number] DOB: 10-Mar-1951 64 y.o. 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     cinacalcet 30 MG tablet  Commonly known as:  SENSIPAR  Take 1 tablet (30 mg total) by mouth daily with breakfast.     clonazePAM 0.5 MG tablet  Commonly known as:  KLONOPIN  Take 0.5 tablets (0.25 mg total) by mouth 2 (two) times daily.     enoxaparin 60 MG/0.6ML injection  Commonly known as:  LOVENOX  Inject 0.6 mLs (60 mg total) into the skin daily.     folic acid-vitamin b complex-vitamin c-selenium-zinc 3 MG Tabs tablet  Take 1 tablet by mouth daily.     midodrine 10 MG tablet  Commonly known as:  PROAMATINE  Take 1.5 tablets (15 mg total) by mouth 3 (three) times daily.     pantoprazole 40 MG tablet  Commonly known as:  PROTONIX  Take 1 tablet (40 mg total) by mouth daily.     phenytoin 100 MG ER capsule  Commonly known as:  DILANTIN  Take 200 mg by mouth at bedtime.     sevelamer carbonate 800 MG tablet  Commonly known as:  RENVELA  Take 800 mg by mouth 3 (three) times daily with meals.     vancomycin 500 mg in sodium chloride 0.9 % 100 mL  Inject 500 mg into the vein Every Tuesday,Thursday,and Saturday with dialysis.        Disposition and follow-up:   [name] was discharged from [facility] in Stable condition.  At the hospital follow up visit please address:  -MRSA bacteremia: reiterating that the infection can't be cleared so long as the infected line is present and most current blood cultures -IVC  thrombosis: bridging to coumadin -ESRD on HD: resumption of  -Goals of care: referral to [facility] if agreeable  2.  Labs / imaging needed at time of follow-up: none  3.  Pending labs/ test needing follow-up: none  Follow-up Appointments: Follow-up Information    Follow up with Luisa Dago, MD. Go on 11/09/2014.   Specialty:  Internal Medicine   Why:  345PM   Contact information:   [address] [place] [address] [phone]       Follow up with Kim,Jennifer J, RPH. Go on 11/09/2014.   Specialty:  Pharmacist   Why:  345PM   Contact information:   [address] [place] [address] [phone]       Discharge Instructions: Discharge Instructions    Call MD for:  persistant nausea and vomiting    Complete by:  As directed      Call MD for:  temperature >100.4    Complete by:  As directed      Increase activity slowly    Complete by:  As directed            Consultations:  Treatment Team:  Dyke Maes, MD  Procedures Performed:  Ct Head Wo Contrast  10/27/2014   CLINICAL DATA:  Altered mental status  EXAM: CT HEAD WITHOUT CONTRAST  TECHNIQUE: Contiguous axial images were obtained from the base of the skull through the vertex without intravenous contrast.  COMPARISON:  None.  FINDINGS: Diffuse atrophic changes are noted. No findings to suggest acute hemorrhage, acute infarction or space-occupying mass lesion are noted. The bony calvarium is intact.  IMPRESSION: Atrophic changes without acute abnormality.   Electronically Signed   By: Alcide CleverMark  Lukens M.D.   On: 10/27/2014 13:51   Ct Abdomen Pelvis W Contrast  10/26/2014   CLINICAL DATA:  Patient arrives via EMS from dialysis center. Patient was extremely combative had a fever and possible UTI. Generalized abdominal pain.  EXAM: CT ABDOMEN AND PELVIS WITH CONTRAST  TECHNIQUE: Multidetector CT imaging of the abdomen and pelvis was performed using the standard protocol following  bolus administration of intravenous contrast.  CONTRAST:  100mL OMNIPAQUE IOHEXOL 300 MG/ML  SOLN  COMPARISON:  CT chest abdomen and pelvis 10/05/2013  FINDINGS: Evaluation of lung bases is limited due to motion artifact. Suggestion of vascular prominence and interstitial changes probably due to edema and fluid overload.  Examination is technically limited due to motion artifact. The liver, spleen, pancreas, adrenal glands, and retroperitoneal lymph nodes appear grossly unremarkable. Gallbladder is contracted appears to be filled with sludge or possibly small stones. No bile duct dilatation. Multi-cystic appearance of the kidneys, some with calcification and some with increased density suggesting hemorrhage. Appearance is grossly unchanged since prior study. No hydronephrosis. Calcification of the abdominal aorta without aneurysm. Right femoral approach intravenous catheter with tip in the right atrium consistent with dialysis catheter. Stomach, small bowel, and colon appear decompressed. No free air or free fluid in the abdomen. Multiple venous collateral vessels are demonstrated throughout the subcutaneous soft tissues of the abdominal wall bilaterally. Previous study demonstrate sclerosis of the superior vena cava, likely accounting for the collaterals.  Pelvis: Stool in the rectosigmoid colon. No inflammatory changes. Bladder is decompressed. Prostate gland is not significantly enlarged. No free or loculated pelvic fluid collections. There is a fluid collection measuring 4.6 x 5 x 7.2 cm demonstrated in the right groin adjacent to vascular grafts arising from the common femoral artery. This is new since previous study and could represent developing hematoma. Alternatively, infected graft with abscess could also have this appearance. Infiltration in the subcutaneous fat consistent with edema. Mild prominence of lymph nodes in the groin regions bilaterally likely are reactive. With the mild lumbar scoliosis convex  towards the left. Degenerative changes in the lumbar spine. No destructive bone lesions appreciated.  IMPRESSION: Multi-cystic appearance of both kidneys, unchanged since prior study. Multiple venous collaterals demonstrated throughout the subcutaneous soft tissues of the abdominal wall consistent with sclerosis of the superior vena cava noted on prior chest CT. Interval development of a fluid collection in the left groin region adjacent to vascular grafts. This could represent hematoma, pseudoaneurysm, or infection related to the graft. Sludge or small stones in the gallbladder.   Electronically Signed   By: Burman NievesWilliam  Stevens M.D.   On: 10/26/2014 23:42   Dg Chest Portable 1 View  10/26/2014   CLINICAL DATA:  Altered mental status. Congestive heart failure. Hypertension.  EXAM: PORTABLE CHEST - 1 VIEW  COMPARISON:  05/09/2014  FINDINGS: Right groin tunneled dialysis catheter noted, terminating in the right atrium.  The patient is rotated to the Right on today's radiograph, reducing diagnostic sensitivity and specificity. Low lung volumes. Mild atherosclerotic calcification of the aortic arch.  The lungs appear clear.  IMPRESSION: 1. Low lung volumes.  However, the lungs appear clear. 2. Dialysis catheter tip: Right atrium.   Electronically Signed   By: Herbie Baltimore M.D.   On: 10/26/2014 18:17    2D Echo:  Study Conclusions  - Left ventricle: The cavity size was normal. Wall thickness was  normal. Systolic function was normal. The estimated ejection fraction was in the range of 60% to 65%. Doppler parameters are consistent with abnormal left ventricular relaxation (grade 1 diastolic dysfunction). The E/e&' ratio is <8, suggesting normal LV filling pressure. - Left atrium: The atrium was normal in size. - Right ventricle: The cavity size was mildly dilated. Systolic function was normal. - Right atrium: The atrium was normal in size. - Atrial septum: No defect or patent foramen ovale was  identified. - Systemic veins: Thrombus or mass noted in the IVC - color flow acceleration noted.  Impressions:  - Compared to the prior echo in 2014, the EF is unchanged. There is now thrombus or mass in the IVC, which extends into the right atrium. This may be thrombus in transit. Consider LE venous dopplers and or CT chest/abdomen of venous system to assess further. Results called to Dr. Criselda Peaches on 1/9 at 13:45.    Admission HPI: Mr Hinchliffe is a 64 year old man with non-verbal mental retardation, ESRD on T/Th/Sa, acquired adrenal insufficiency, chronic hypotension, anemia, and history of seizures who presents with agitation. History was obtained from ED physician and EMR as his caregiver Lavell Anchors) had gone home for the night at the time of the exam. He attended and completed HD today and was noted to be combative and incontinent of stool and reportedly urine although he is anuric. When asked about pain by the ED physician, Mr Waldrop repeatedly placed his hands on his mid-abdomen. Caregiver reported his diarrhea today but denied any nausea. He had no other bowel movements while in the ED. His caretaker said that since 10/24/14 he has been irritable and will try to push him away which is typical behavior when he is not feeling well.  During our interview, he was sleeping but arousable. I asked if he remembered me from this summer and he nodded yes. We asked if he was in pain several times and he did not provide any response. He did not respond to any other questions.  In the ED, he received 500 cc NS bolus, solucortef (hydrocortisone 100 mg iv once). When discussing case with the ED physician, ED physician called contact number which went to voicemail.  Of note, Mr Bisping was admitted in 04/2014. 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 He had a left thigh AV graft placed in 01/2013 and a revision in 04/2014 with a hemorrhagic complication as an artery was  nicked during a thrombolysis procedure. A R HD catheter was placed which is his only remaining access site. He then developed and was treated for a Pseudomonas bacteremia likely related to manipulating his catheter.     Hospital Course by problem list:   MRSA bacteremia: Likely the cause of agitation as blood cultures x2 on admission were remarkable for MRSA with suspected source of infection his R HD catheter. On hospital day 2 [1/9], he became progressively hypotensive and was transferred to the ICU overnight. Code status at this time was revised from DNR to LIMITED CODE [all interventions EXCEPT CPR] per conversations between treatment team and HCPOA [name]. He was started on hydrocortisone  q8h & given  2L NS bolus before being transferred back out. As this catheter was needed for his dialysis, Nephrology recommended he keep the catheter pending Vascular Surgery's assessment. Vascular Surgery recommended comfort care or consultation with IR for transhepatic/translumbar access though current infection limited pursuing new interventions. Infectious Diseases was also consulted and recommended comfort care as they assessed infection to possibly involve deeper structures than the catheter itself; they did not recommend giving IV antibiotics through the HD catheter. Family meeting was organized with his three sisters in which options for comfort care, IR consult, and dialysis with IV antibiotics were reviewed [see chart note for further detail]. On the day of discharge, Aura Camps reported wishing to continue dialysis and IV antibiotics at his dialysis center. Blood cultures drawn 48 hours after initiation of vancomycin were positive for MRSA as expected and should be communicated to the family that the patient has a poor prognosis for clearance of his infection with high risk of readmission for complications secondary to his sepsis. Readmission would be inconsistent with the values they reported for the  patient [spending time with others, watching TV] which he is unable to enjoy as an inpatient.  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 He was started on full-dose Lovenox after the risks and benefits were weighed and discharged on this medication with the plan to bridge him to warfarin as an outpatient.   ESRD on HD: As noted above. His home medications were continued though he was not agreeable to taking some of them. He is to resume his T/H/Sa schedule.  Chronic hypotension: Prior workup for Addison's disease had been negative. He continued home midodrine 15mg  TID with systolics trending mostly in the 80s on the day of discharge. Peripheral IV access was lost after transfer out of the ICU, and he was not agreeable to having a new catheter put in which limited his ability to receive hydrocortisone IV.   Seizure disorder: Phenytoin level was subtherapeutic at <2.5. EEG was unremarkable for an epileptic event which could have prompted his initial presentation to the hospital.  Anemia of chronic disease: Hb remained stable 8-9 per the labs that were drawn and should be reassessed at his next dialysis session.  GERD: Remained stable on home medications.  Hyperlipidemia: Remained stable on home medications.   Discharge Vitals:   BP 80/51 mmHg  Pulse 100  Temp(Src) 98 F (36.7 C) (Oral)  Resp 18  Ht 5' (1.524 m)  Wt 127 lb 6.8 oz (57.8 kg)  BMI 24.89 kg/m2  SpO2 95%  Discharge Labs:  No results found for this or any previous visit (from the past 24 hour(s)).  Signed: Heywood Iles, MD 11/06/2014, 2:44 PM    Services Ordered on Discharge: None Equipment Ordered on Discharge: None

## 2014-10-30 NOTE — Progress Notes (Signed)
Subjective:  [name] was nonverbal (baseline) and appeared comfortable. He was alert and able to signal for a washcloth.   Objective Filed Vitals:   10/30/14 0602  BP: 98/67  Pulse: 95  Temp: 98.6 F (37 C)  Resp: 18    Physical Exam General: Awake and alert. Nonverbal at baseline. No acute distress.  Heart: Regular rate and rhythm without murmur or rub.  Lungs: Clear to ascultation. Unlabored without use of accessory muscles.  Abdomen: soft, non tender non distended, Bowel sounds present.  Extremities: No edema, Pedal pulses present.  Dialysis Access: R femoral cath, without drainage  Dialysis Orders: [facility] . EDW 57kg 2.o 2.oca HD Time 3hr 45min Heparin 3500. Access r fem Perm cath/ Hectorol 3mcg IV/HD Aranesp 100mcg qweeklyUnits IV/HD Venofer 100mcg load stop 11/07/14   Assessment/Plan 1. Staph blood cultures - Etiology suspected to be femoral HD cath. Echo 1/10 showed no vegetation on valves. Abx: vanc, rocephin discontinued. Primary team repeating blood cultures. 2. 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 started on levenox. Will need plan for long term anticoagulation with warfarin after discharge. 3. ESRD - HD TTS,  K 4.6 (1/9) 4. Hypertension/volume- BP 98/67. On Midodrine 15 mg TID. Wt 60.6 kg, no volume excess. Adjust EDW upon discharge. 5. Anemia - Continue Aranesp 100 mcg every Thurday, Hgb 8.5 (1/9) 6. Metabolic bone disease - Continue Renvela, hectorol, sensipar. Ca 8.2 (1/9) and phos 5.8(1/7) 7. Nutrition - Diet DYS , Rena- Vit 8. End stage HD Access- Discuss with power of attorney next steps due to recurrent infections to HD access.  9. History of Seizure disorder - EEG completed 1/8, results pending.   [name] HubertKelly Michelle Young PA Student   Pt seen, examined and agree w A/P as above.  [name] [name] Lamorte MD pager [phone]    cell [phone] 10/30/2014, 2:11 PM     Additional Objective Labs: BMP Latest Ref Rng 10/28/2014  10/26/2014 05/09/2014  Glucose 70 - 99 mg/dL 478(G159(H) 956(O101(H) -  BUN 6 - 23 mg/dL 13(Y39(H) 12 -  Creatinine 0.50 - 1.35 mg/dL 8.65(H8.24(H) 8.46(N4.36(H) -  Sodium 135 - 145 mmol/L 140 139 137  Potassium 3.5 - 5.1 mmol/L 4.6 3.6 4.5  Chloride 96 - 112 mEq/L 103 99 -  CO2 19 - 32 mmol/L 19 27 -  Calcium 8.4 - 10.5 mg/dL 8.2(L) 8.5 -    CBC    Component Value Date/Time   WBC 6.7 10/28/2014 0730   RBC 2.91* 10/28/2014 0730   RBC 2.30* 04/17/2014 0800   HGB 8.5* 10/28/2014 0730   HCT 28.1* 10/28/2014 0730   PLT 172 10/28/2014 0730   MCV 96.6 10/28/2014 0730   MCH 29.2 10/28/2014 0730   MCHC 30.2 10/28/2014 0730   RDW 18.3* 10/28/2014 0730   LYMPHSABS 0.6* 10/26/2014 1759   MONOABS 0.7 10/26/2014 1759   EOSABS 0.0 10/26/2014 1759   BASOSABS 0.0 10/26/2014 1759    Blood Culture    Component Value Date/Time   SDES BLOOD HEMODIALYSIS CATHETER 10/27/2014 1110   SPECREQUEST BOTTLES DRAWN AEROBIC AND ANAEROBIC 5CC 10/27/2014 1110   CULT  10/27/2014 1110           BLOOD CULTURE RECEIVED NO GROWTH TO DATE CULTURE WILL BE HELD FOR 5 DAYS BEFORE ISSUING A FINAL NEGATIVE REPORT Performed at [facility]    REPTSTATUS PENDING 10/27/2014 1110    Cardiac Enzymes  Lab Results  Component Value Date   CKTOTAL 72 11/22/2010   CKMB  1.4 11/22/2010   TROPONINI 0.03 10/27/2014    CBG (last 3)   Recent Labs  10/28/14 0211  GLUCAP 124*   Medications:   Scheduled Meds: . atorvastatin  20 mg Oral QHS  . Chlorhexidine Gluconate Cloth  6 each Topical Q0600  . cinacalcet  30 mg Oral Q breakfast  . [START ON 11/02/2014] darbepoetin (ARANESP) injection - DIALYSIS  100 mcg Intravenous Q Thu-HD  . doxercalciferol  3 mcg Oral Q T,Th,Sa-HD  . enoxaparin (LOVENOX) injection  60 mg Subcutaneous Q24H  . hydrocortisone sod succinate (SOLU-CORTEF) inj  50 mg Intravenous Q6H  . midodrine  15 mg Oral TID WC  . multivitamin  1 tablet Oral QHS  . mupirocin ointment  1 application Nasal BID  . pantoprazole  40  mg Oral Daily  . phenytoin  250 mg Oral QHS  . sevelamer carbonate  800 mg Oral TID WC  . sodium chloride  1,000 mL Intravenous Once  . vancomycin  500 mg Intravenous Q T,Th,Sa-HD   Continuous Infusions:  PRN Meds:.acetaminophen **OR** acetaminophen

## 2014-10-30 NOTE — Consult Note (Signed)
Hospital Consult    Reason for Consult:  ? Infected left thigh graft Referring Physician:  Arta Silence MRN #:  [id number]  Information obtained from chart as pt does speak and his caregiver is not present at this time.  History of Present Illness: This is a 64 y.o. male who is well known to our practice.  He presented to the hospital 3 days ago with reported agitation, AMS and incontinent of stool and urine.  Per the chart, his caregiver reports more lethargy and agitation prior to admission with c/o bilateral leg pain.    This pt has had extensive access procedures in the past.  He now has a right femoral catheter, which was placed June 2015.  This has been exchanged on multiple occasions.  Last June, the pt underwent patch angioplasty of left SFA, thrombectomy of left thigh graft and patch angioplasty of the left venous anastomosis with new interposition graft in the arterial limb.  There were findings of dense scar tissue and an Iatrogenic injury to the anterior wall of the left SFA requiring bovine pericardial patch.  VVS is consulted for possible infected left thigh graft/diatek.  Past Medical History  Diagnosis Date  . Heart murmur, systolic 08/10/2009  . Syncope 05/07/2009  . 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 Esophageal varices 11/07/2008  . Gastric ulcer 10/09    antral, with h pylori positive  . Congestive heart failure 03/06/2008  . Cellulitis and abscess of leg, except foot 03/06/2008  . Secondary hyperparathyroidism 02/02/2008  . Mute 02/02/2008  . Hyperlipidemia 02/02/2008  . Anemia 02/02/2008  . ESRD (end stage renal disease)     TTS hemodialysis  . GERD (gastroesophageal reflux disease)   . Hypertension 02/02/2008    in history  . Mental retardation 02/02/2008  . Mute   . Seizures     "non in a while at home". none in past year .  Marland Kitchen Complication of anesthesia     in the past BP has dropped   . Sepsis due to Pseudomonas species 05/05/2014    Past Surgical  History  Procedure Laterality Date  . Left forearm graft      for HD  . Arteriovenous graft placement  11/22/10    Right thigh AVG  . Thrombectomy and revision of arterioventous (av) goretex  graft    . Thrombectomy and revision of arterioventous (av) goretex  graft  10/22/2012    Procedure: THROMBECTOMY AND REVISION OF ARTERIOVENTOUS (AV) GORETEX  GRAFT;  Surgeon: Larina Earthly, MD;  [facility] OR;  Service: Vascular;  Laterality: Right;  . Thrombectomy w/ embolectomy  11/10/2012    Procedure: THROMBECTOMY ARTERIOVENOUS GORE-TEX GRAFT;  Surgeon: Pryor Ochoa, MD;  Location: [facility] OR;  Service: Vascular;  Laterality: Right;  . Thrombectomy and revision of arterioventous (av) goretex  graft Right 12/08/2012    Procedure: THROMBECTOMY AND REVISION OF ARTERIOVENTOUS (AV) GORETEX  GRAFT right thigh;  Surgeon: Sherren Kerns, MD;  Location: [facility] OR;  Service: Vascular;  Laterality: Right;  Susie Cassette N/A 12/08/2012    Procedure: VENOGRAM;  Surgeon: Sherren Kerns, MD;  Location: [facility] OR;  Service: Vascular;  Laterality: N/A;  Intraoperative Central venogram  . Thrombectomy w/ embolectomy Right 12/12/2012    Procedure: THROMBECTOMY ARTERIOVENOUS GORE-TEX GRAFT;  Surgeon: Nada Libman, MD;  Location: [facility] OR;  Service: Vascular;  Laterality: Right;  . Insertion of dialysis catheter Left 12/14/2012    Procedure: INSERTION OF DIALYSIS CATHETER;  Surgeon: Nada Libman, MD;  Location: [facility] OR;  Service: Vascular;  Laterality: Left;  . Insertion of dialysis catheter Right 01/13/2013    Procedure: INSERTION OF DIALYSIS CATHETER;  Surgeon: Nada LibmanVance W Brabham, MD;  [facility];  Service: Vascular;  Laterality: Right;  . Removal of a dialysis catheter Left 01/13/2013    Procedure: REMOVAL OF A DIALYSIS CATHETER;  Surgeon: Nada LibmanVance W Brabham, MD;  Location: [facility] OR;  Service: Vascular;  Laterality: Left;  . Av fistula placement Left 02/11/2013    Procedure: INSERTION OF ARTERIOVENOUS (AV) GORE-TEX GRAFT THIGH;  Surgeon:  Nada LibmanVance W Brabham, MD;  Location: [facility] OR;  Service: Vascular;  Laterality: Left;  using 6mm x 50cm Gore-Tex Vascular Graft  . Esophagogastroduodenoscopy N/A 02/16/2013    Procedure: ESOPHAGOGASTRODUODENOSCOPY (EGD);  Surgeon: Vertell NovakJames L Edwards Jr., MD;  [facility];  Service: Endoscopy;  Laterality: N/A;  bedside  . Esophagogastroduodenoscopy N/A 09/14/2013    Procedure: ESOPHAGOGASTRODUODENOSCOPY (EGD);  Surgeon: Vertell NovakJames L Edwards Jr., MD;  [facility];  Service: Endoscopy;  Laterality: N/A;  control of bleeding if needed  . Esophagogastroduodenoscopy N/A 10/03/2013    Procedure: ESOPHAGOGASTRODUODENOSCOPY (EGD);  Surgeon: Theda BelfastPatrick D Hung, MD;  [facility];  Service: Endoscopy;  Laterality: N/A;  Bedside  . Radiology with anesthesia Right 10/19/2013    Procedure: RADIOLOGY WITH ANESTHESIA;  Surgeon: Malachy MoanHeath McCullough, MD;  [facility];  Service: Radiology;  Laterality: Right;  . Radiology with anesthesia Left 12/28/2013    Procedure: RADIOLOGY WITH ANESTHESIA;  Surgeon: Reola CalkinsGlenn T Yamagata, MD;  [facility];  Service: Radiology;  Laterality: Left;  . Thrombectomy and revision of arterioventous (av) goretex  graft Left 01/25/2014    Procedure: THROMBECTOMY AND REVISION OF LEFT THIGH ARTERIOVENTOUS (AV) GORETEX  GRAFT WITH PATCH ANGIOPLASTY;  Surgeon: Pryor OchoaJames D Lawson, MD;  [facility];  Service: Vascular;  Laterality: Left;  . Thrombectomy and revision of arterioventous (av) goretex  graft Left 04/10/2014    Procedure: THROMBECTOMY AND REVISION OF ARTERIOVENTOUS (AV) GORETEX  GRAFT;  Surgeon: Sherren Kernsharles E Fields, MD;  [facility];  Service: Vascular;  Laterality: Left;  . Patch angioplasty Left 04/10/2014    Procedure: PATCH ANGIOPLASTY LEFT SFA;  Surgeon: Sherren Kernsharles E Fields, MD;  [facility];  Service: Vascular;  Laterality: Left;  . Insertion of dialysis catheter Bilateral 04/11/2014    Procedure: INSERTION OF DIALYSIS CATHETER RIGHT FEMORAL VEIN; INSERTION OF TRIPLE  LUMEN LEFT FEMORAL VEIN CENTRAL LINE; REMOVAL OF DIALYSIS CATHETER IN RIGHT FEMORAL VEIN.;  Surgeon: Nada LibmanVance W Brabham, MD;  Location: [facility] OR;  Service: Vascular;  Laterality: Bilateral;  . Exchange of a dialysis catheter Right 04/29/2014    Procedure: EXCHANGE OF A  FEMORAL DIALYSIS CATHETER;  Surgeon: Pryor OchoaJames D Lawson, MD;  [facility];  Service: Vascular;  Laterality: Right;  . Insertion of dialysis catheter Right 05/09/2014    Procedure: INSERTION OF DIALYSIS CATHETER- FEMORAL;  Surgeon: Fransisco HertzBrian L Chen, MD;  [facility];  Service: Vascular;  Laterality: Right;  . Radiology with anesthesia N/A 05/11/2014    Procedure: RADIOLOGY WITH ANESTHESIA;  Surgeon: Durwin Glazeayne Daniel Hassell III, MD;  [facility];  Service: Radiology;  Laterality: N/A;    No Known Allergies  Prior to Admission medications   Medication Sig Start Date End Date Taking? Authorizing Provider  atorvastatin (LIPITOR) 20 MG tablet Take 1 tablet (20 mg total) by mouth at bedtime.   Yes Courtney ParisEden W Jones, MD  cinacalcet (SENSIPAR) 30 MG tablet Take 1 tablet (30 mg total) by  mouth daily with breakfast. 06/19/14  Yes Gara Kroner, MD  clonazePAM (KLONOPIN) 0.5 MG tablet Take 0.5 tablets (0.25 mg total) by mouth 2 (two) times daily. 06/19/14  Yes Gara Kroner, MD  folic acid-vitamin b complex-vitamin c-selenium-zinc (DIALYVITE) 3 MG TABS tablet Take 1 tablet by mouth daily.   Yes Historical Provider, MD  midodrine (PROAMATINE) 10 MG tablet Take 1.5 tablets (15 mg total) by mouth 3 (three) times daily. 06/21/14  Yes Gara Kroner, MD  pantoprazole (PROTONIX) 40 MG tablet Take 1 tablet (40 mg total) by mouth daily. 06/19/14  Yes Gara Kroner, MD  phenytoin (DILANTIN) 100 MG ER capsule Take 200 mg by mouth at bedtime.   Yes Historical Provider, MD  sevelamer carbonate (RENVELA) 800 MG tablet Take 800 mg by mouth 3 (three) times daily with meals.   Yes Historical Provider, MD    History   Social History  . Marital Status: Single    Spouse Name:  N/A    Number of Children: 0  . Years of Education: N/A   Occupational History  .     Social History Main Topics  . Smoking status: Never Smoker   . Smokeless tobacco: Never Used  . Alcohol Use: No  . Drug Use: No  . Sexual Activity: No   Other Topics Concern  . Not on file   Social History Narrative   Patient is living with care providers.    Patient is right handed.    Patient does not have any children.    Patient is on disability            History reviewed. No pertinent family history.  ROS: []  Positive   [ ]  Negative   [ ]  All sytems reviewed and are negative Unable to obtain from pt   Cardiovascular: []  chest pain/pressure []  palpitations []  SOB lying flat []  DOE []  pain in legs while walking []  pain in legs at rest []  pain in legs at night []  non-healing ulcers []  hx of DVT []  swelling in legs  Pulmonary: []  productive cough []  asthma/wheezing []  home O2  Neurologic: []  weakness in []  arms []  legs []  numbness in []  arms []  legs []  hx of CVA []  mini stroke [] difficulty speaking or slurred speech []  temporary loss of vision in one eye []  dizziness  Hematologic: []  hx of cancer []  bleeding problems []  problems with blood clotting easily  Endocrine:   []  diabetes []  thyroid disease  GI []  vomiting blood []  blood in stool  GU: []  CKD/renal failure []  HD--[]  M/W/F or []  T/T/S []  burning with urination []  blood in urine  Psychiatric: []  anxiety []  depression  Musculoskeletal: []  arthritis []  joint pain  Integumentary: []  rashes []  ulcers  Constitutional: []  fever []  chills   Physical Examination  Filed Vitals:   10/30/14 0602  BP: 98/67  Pulse: 95  Temp: 98.6 F (37 C)  Resp: 18   Body mass index is 26.09 kg/(m^2).  General:  WDWN in NAD Gait: Not observed HENT: WNL, normocephalic Pulmonary: normal non-labored breathing, without Rales, rhonchi,  wheezing Cardiac: regular, without  Murmurs, rubs or  gallops; Abdomen: soft, NT/ND, no masses Skin: without rashes, without ulcers  Vascular Exam/Pulses:  Right Left  Radial Unable to palpate  Unable to palpate   Ulnar Unable to palpate  Unable to palpate   Femoral Unable to palpate  Unable to palpate   DP 1+ (weak) trace  PT Unable to palpate  Unable to palpate  Extremities: without ischemic changes, without Gangrene , without cellulitis; without open wounds; there is some induration right lateral thigh Musculoskeletal: no muscle wasting or atrophy  Neurologic: A&O X 3   CBC    Component Value Date/Time   WBC 6.7 10/28/2014 0730   RBC 2.91* 10/28/2014 0730   RBC 2.30* 04/17/2014 0800   HGB 8.5* 10/28/2014 0730   HCT 28.1* 10/28/2014 0730   PLT 172 10/28/2014 0730   MCV 96.6 10/28/2014 0730   MCH 29.2 10/28/2014 0730   MCHC 30.2 10/28/2014 0730   RDW 18.3* 10/28/2014 0730   LYMPHSABS 0.6* 10/26/2014 1759   MONOABS 0.7 10/26/2014 1759   EOSABS 0.0 10/26/2014 1759   BASOSABS 0.0 10/26/2014 1759    BMET    Component Value Date/Time   NA 140 10/28/2014 0730   K 4.6 10/28/2014 0730   CL 103 10/28/2014 0730   CO2 19 10/28/2014 0730   GLUCOSE 159* 10/28/2014 0730   BUN 39* 10/28/2014 0730   CREATININE 8.24* 10/28/2014 0730   CALCIUM 8.2* 10/28/2014 0730   GFRNONAA 6* 10/28/2014 0730   GFRAA 7* 10/28/2014 0730    COAGS: Lab Results  Component Value Date   INR 1.36 10/27/2014   INR 1.08 04/29/2014   INR 1.13 10/02/2013      ASSESSMENT/PLAN: This is a 64 y.o. male with ESRD with limited options for access in this difficult pt.  -blood cx drawn reveal MRSA -this pt access has been very difficult in the past.  This pt is at very high risk for removal of the left thigh graft as there was very dense scar tissue present last June.   -agree with ID and recommend palliative care consult as this pt is out of options for access and any attempt to remove the AVG is VERY high risk.   Doreatha Massed,  PA-C [facility] [phone]  Addendum  I have independently interviewed and examined the patient, and I agree with the physician assistant's findings.  This patient is well known to our practice from multiple procedures.  He is essentially end access.  I see no evidence of B thigh AVG infection, so the likely culprit is the R fem TDC.  Pt had known central venous stenosis/occlusion so IJV/SCV TDC appears unlikely.  I doubt a L femoral TDC will be possible, so this leave possible translumbar vs transhepatic TDC placement.  Subsequently, I would defer any further [facility] placement to IR in case secondary [facility] placement is necessary.  I don't know this patient's quality of life, but I would strongly consider palliative care and comfort care for this patient.     Leonides Sake, MD [facility] [place] Office: [phone] Pager: [phone]  10/30/2014, 5:56 PM

## 2014-10-30 NOTE — Plan of Care (Signed)
Problem: Phase I Progression Outcomes Goal: Pain controlled with appropriate interventions Outcome: Progressing Medicated with 2 tylenol  Facial grimace

## 2014-10-30 NOTE — Progress Notes (Signed)
Physical Therapy Treatment Patient Details Name: [name] Violett MRN: [id number] DOB: 12-07-1950 Today's Date: 10/30/2014    History of Present Illness Pt admitted after HD with fever, AMS and agitation, recent diarrhea and indication of abdominal pain. 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    PT Comments    Pt making very slow progress with therapy due to cognitive status.  He did allow +2 assist for gait around bed to recliner and back to bed today.  Requires total A to +2 assist for all mobility due to resistance.    Follow Up Recommendations  Home health PT;Supervision/Assistance - 24 hour     Equipment Recommendations  Other (comment)    Recommendations for Other Services       Precautions / Restrictions Precautions Precautions: Fall Precaution Comments: Very impulsive, Significant fall risk Restrictions Weight Bearing Restrictions: No    Mobility  Bed Mobility Overal bed mobility: Needs Assistance;+2 for physical assistance Bed Mobility: Supine to Sit;Sit to Supine     Supine to sit: +2 for physical assistance;Total assist Sit to supine: +2 for physical assistance;Total assist   General bed mobility comments: Pt continues to be resistive to all movements, therefore requires total A of +2 for bed mobility during session.   Transfers Overall transfer level: Needs assistance Equipment used: 2 person hand held assist Transfers: Sit to/from Stand Sit to Stand: Max assist;+2 physical assistance         General transfer comment: Pt requires +2 total A for safety to get pt into standing due to posterior resistance.   Ambulation/Gait Ambulation/Gait assistance: +2 physical assistance Ambulation Distance (Feet): 15 Feet (x 2) Assistive device: 2 person hand held assist Gait Pattern/deviations: Step-to pattern;Decreased stride length;Shuffle;Narrow base of support;Trunk flexed Gait velocity: decr    General Gait Details: Pt able to initiate steps once assisted into standing, however requires +2 for safety as he is very impulsive and tends to attempt to sit without warning.  Upon sitting to recliner, pt getting back into standing and was assisted back to bed.  Requires assist for peri care due to bowel incontinence.     Stairs            Wheelchair Mobility    Modified Rankin (Stroke Patients Only)       Balance Overall balance assessment: Needs assistance Sitting-balance support: Feet supported;Bilateral upper extremity supported Sitting balance-Leahy Scale: Zero Sitting balance - Comments: Requires up to max to +2 for sitting balance due to resistance with posterior lean.  Postural control: Posterior lean Standing balance support: During functional activity;Bilateral upper extremity supported Standing balance-Leahy Scale: Zero Standing balance comment: Requires +2 assist to total A to maintain standing.                     Cognition Arousal/Alertness: Awake/alert Behavior During Therapy: Flat affect Overall Cognitive Status: History of cognitive impairments - at baseline                      Exercises      General Comments        Pertinent Vitals/Pain Pain Assessment: No/denies pain    Home Living                      Prior Function            PT Goals (current goals can now be found in the care plan section)  Acute Rehab PT Goals Patient Stated Goal: Pt unable to state. PT Goal Formulation: Patient unable to participate in goal setting Time For Goal Achievement: 11/03/14 Potential to Achieve Goals: Fair Progress towards PT goals: Progressing toward goals    Frequency  Min 3X/week    PT Plan Current plan remains appropriate    Co-evaluation PT/OT/SLP Co-Evaluation/Treatment: Yes Reason for Co-Treatment: For patient/therapist safety PT goals addressed during session: Mobility/safety with mobility;Balance       End  of Session   Activity Tolerance: Treatment limited secondary to agitation Patient left: in bed;with call bell/phone within reach;with bed alarm set     Time: 4098-1191 PT Time Calculation (min) (ACUTE ONLY): 29 min  Charges:  $Gait Training: 8-22 mins                    G Codes:      Vista Deck 10/30/2014, 11:07 AM

## 2014-10-30 NOTE — Progress Notes (Signed)
Date: 10/30/2014  Patient name: [name]  Medical record number: [id number]  Date of birth: [birth year]   This patient's plan of care was discussed with the house staff. Please see Dr. Eliane Decree note for complete details. I concur with his findings.  Agree with specialists on case given his end access and severe infection, palliative care is an appropriate team to get involved.  For his vancomycin, per Dr. Zenaida Niece Dam's note, trough should be 15-20. I do not see that any vancomycin trough's have been drawn, will discuss with pharmacy.    Inez Catalina, MD 10/30/2014, 8:18 PM

## 2014-10-30 NOTE — Progress Notes (Signed)
SLP Cancellation Note  Patient Details Name: [name] MRN: [id number] DOB: 05-22-1951   Cancelled treatment:       Reason Eval/Treat Not Completed: Patient declined, no reason specified.  Diagnostic treatment session attempted.  Patient pushing SLP hands away, pushing away both liquids and solids in attempts to feed or facilitate self-feeding.  At this time can only continue to recommend Dys. 1 textures diet and no liquids due to inability to observe.  RN reports that patient had no difficulty consuming applesauce this morning with caregiver, but has also refused to drink with them.  SLP signing off due to refusal to participate in therapy sessions.  Please reorder if participation increases and need arises.    Fae PippinMelissa Rossi Silvestro, M.A., CCC-SLP [phone]   Hildreth Robart 10/30/2014, 4:08 PM

## 2014-10-30 NOTE — Progress Notes (Signed)
Subjective: This AM, he is alert and looking around the room while moving his arms. Per RN, he is not agreeable to lab draws or PO intake.   I attempted to reach his caregiver, Lavell Anchors, and left voicemail; I spoke with his sister Campbell Lerner who appreciated teh updates.   Objective: Vital signs in last 24 hours: Filed Vitals:   10/29/14 1054 10/29/14 1741 10/29/14 2125 10/30/14 0602  BP: 98/67  Pulse: 100 100 99 95  Temp: 98.3 F (36.8 C) 98.1 F (36.7 C) 98.3 F (36.8 C) 98.6 F (37 C)  TempSrc: Oral Oral Oral Oral  Resp: Height:      Weight:   133 lb 9.6 oz (60.6 kg)   SpO2: 100% 98% 93% 95%   Weight change: -2 lb 6.8 oz (-1.1 kg)  Intake/Output Summary (Last 24 hours) at 10/30/14 0753 Last data filed at 10/29/14 1700  Gross per 24 hour  Intake      0 ml  Output      0 ml  Net      0 ml   General: resting in bed, NAD HEENT: PERRL, EOMI, no scleral icterus, crusty nares Cardiac: RRR, no rubs, murmurs or gallops Pulm: upper airway sounds present on anterior auscultation Abd: soft, nontender, nondistended, BS present, variceal cords present along edge of upper quadrants Ext: L AV graft scarred, R HD cath covered by bandage without drainage, intact Neuro: moving all extremities freely sometimes to stimuli or questions  Lab Results: Basic Metabolic Panel:  Recent Labs Lab 10/26/14 1759 10/27/14 0650 10/28/14 0730  NA 139  --  140  K 3.6  --  4.6  CL 99  --  103  CO2 27  --  19  GLUCOSE 101*  --  159*  BUN 12  --  39*  CREATININE 4.36*  --  8.24*  CALCIUM 8.5  --  8.2*  MG  --  2.0  --   PHOS  --  5.8*  --    CBC:  Recent Labs Lab 10/26/14 1759 10/27/14 0620 10/28/14 0730  WBC 9.4 8.1 6.7  NEUTROABS 8.1*  --   --   HGB 8.9* 8.5* 8.5*  HCT 29.0* 27.7* 28.1*  MCV 97.3 97.5 96.6  PLT 163 183 172   Cardiac Enzymes:  Recent Labs Lab 10/27/14 0650  TROPONINI 0.03   CBG  Recent Labs Lab 10/28/14 0211  GLUCAP 124*     Fasting Lipid Panel:  Recent Labs Lab 10/27/14 0650  CHOL 137  HDL 57  LDLCALC 44  TRIG 180*  CHOLHDL 2.4   Thyroid Function Tests:  Recent Labs Lab 10/27/14 0650  TSH 4.382   Coagulation:  Recent Labs Lab 10/27/14 0650  LABPROT 16.9*  INR 1.36    Micro Results: Recent Results (from the past 240 hour(s))  Blood culture (routine x 2)     Status: None   Collection Time: 10/26/14  5:59 PM  Result Value Ref Range Status   Specimen Description BLOOD ARM LEFT  Final   Special Requests BOTTLES DRAWN AEROBIC AND ANAEROBIC 5CC  Final   Culture   Final    METHICILLIN RESISTANT STAPHYLOCOCCUS AUREUS Note: SUSCEPTIBILITIES PERFORMED ON PREVIOUS CULTURE WITHIN THE LAST 5 DAYS. Note: Gram Stain Report Called to,Read Back By and Verified With: Caesar Bookman 10/27/14 1120 BY SMITHERSJ Performed at [facility]    Report Status 10/29/2014 FINAL  Final  Blood culture (routine x 2)  Status: None   Collection Time: 10/26/14  6:05 PM  Result Value Ref Range Status   Specimen Description BLOOD HAND RIGHT  Final   Special Requests BOTTLES DRAWN AEROBIC AND ANAEROBIC 3CC  Final   Culture   Final    METHICILLIN RESISTANT STAPHYLOCOCCUS AUREUS Note: RIFAMPIN AND GENTAMICIN SHOULD NOT BE USED AS SINGLE DRUGS FOR TREATMENT OF STAPH INFECTIONS. This organism DOES NOT demonstrate inducible Clindamycin resistance in vitro. CRITICAL RESULT CALLED TO, READ BACK BY AND VERIFIED WITH: JASMINE ADAMS  10/29/14 @ 9:41AM BY RUSCOE A. Note: Gram Stain Report Called to,Read Back By and Verified With: Caesar BookmanKATHY WIECKER 10/27/14 1120 BY SMITHERSJ [facility]formed at [facility]    Report Status 10/29/2014 FINAL  Final   Organism ID, Bacteria METHICILLIN RESISTANT STAPHYLOCOCCUS AUREUS  Final      Susceptibility   Methicillin resistant staphylococcus aureus - MIC*    CLINDAMYCIN <=0.25 SENSITIVE Sensitive     ERYTHROMYCIN >=8 RESISTANT Resistant     GENTAMICIN <=0.5 SENSITIVE Sensitive      LEVOFLOXACIN <=0.12 SENSITIVE Sensitive     OXACILLIN >=4 RESISTANT Resistant     PENICILLIN >=0.5 RESISTANT Resistant     RIFAMPIN <=0.5 SENSITIVE Sensitive     TRIMETH/SULFA <=10 SENSITIVE Sensitive     VANCOMYCIN 1 SENSITIVE Sensitive     TETRACYCLINE <=1 SENSITIVE Sensitive     * METHICILLIN RESISTANT STAPHYLOCOCCUS AUREUS  MRSA PCR Screening     Status: Abnormal   Collection Time: 10/27/14  3:23 AM  Result Value Ref Range Status   MRSA by PCR POSITIVE (A) NEGATIVE Final    Comment:        The GeneXpert MRSA Assay (FDA approved for NASAL specimens only), is one component of a comprehensive MRSA colonization surveillance program. It is not intended to diagnose MRSA infection nor to guide or monitor treatment for MRSA infections. RESULT CALLED TO, READ BACK BY AND VERIFIED WITH: CALLED TO RN Junius ArgyleJANICE TEMBRINA [phone] @0600  THANEY   Culture, blood (routine x 2)     Status: None (Preliminary result)   Collection Time: 10/27/14 11:10 AM  Result Value Ref Range Status   Specimen Description BLOOD HEMODIALYSIS CATHETER  Final   Special Requests BOTTLES DRAWN AEROBIC AND ANAEROBIC 5CC  Final   Culture   Final           BLOOD CULTURE RECEIVED NO GROWTH TO DATE CULTURE WILL BE HELD FOR 5 DAYS BEFORE ISSUING A FINAL NEGATIVE REPORT [facility]formed at [facility]    Report Status PENDING  Incomplete   Studies/Results: No results found. Medications: I have reviewed the patient's current medications. Scheduled Meds: . atorvastatin  20 mg Oral QHS  . Chlorhexidine Gluconate Cloth  6 each Topical Q0600  . cinacalcet  30 mg Oral Q breakfast  . [START ON 11/02/2014] darbepoetin (ARANESP) injection - DIALYSIS  100 mcg Intravenous Q Thu-HD  . doxercalciferol  3 mcg Oral Q T,Th,Sa-HD  . enoxaparin (LOVENOX) injection  60 mg Subcutaneous Q24H  . hydrocortisone sod succinate (SOLU-CORTEF) inj  50 mg Intravenous Q6H  . midodrine  15 mg Oral TID WC  . multivitamin  1 tablet Oral QHS  .  mupirocin ointment  1 application Nasal BID  . pantoprazole  40 mg Oral Daily  . phenytoin  250 mg Oral QHS  . sevelamer carbonate  800 mg Oral TID WC  . sodium chloride  1,000 mL Intravenous Once  . vancomycin  500 mg Intravenous Q T,Th,Sa-HD   Continuous Infusions:  PRN Meds:.acetaminophen **OR** acetaminophen Assessment/Plan:  MRSA bacteremia: Likely source R femoral cath with NGTD x 3 days from cultures drawn there. Repeat blood cultures have not been drawn as the patient is not agreeable and will likely require the assistance of his caregiver. TTE 1/9 without valvular vegetations.  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  -Continue Lovenox per Pharmacy, currently  daily with likely indefinite duration given immobility  ESRD on HD: T/H/Sa schedule. HD access is an issue as L femoral vein is occluded, R site presumably source of infection though may be used for now pending Vascular recs. -Continue home Sensipar, Renvela, Dialyvite -Nephrology & Vascular following, appreciate recs.   Chronic hypotension: Systolic BP trending mostly 90s.  -Continue home midodrine  TID -Continue hydrocortisone  q6h  Seizure disorder: EEG 10/27/2013 unremarkable. -Continue phenytoin  Anemia of chronic disease, with ESRD: Stable at 8.5 on 1/9, baseline 8-9.  -Continue assessing  GERD: Continue home Protonix  Hyperlipidemia: Continue home lipitor   FEN:  -Diet: Dysphagia I  DVT prophylaxis: heparin 5000 units subcutaneous  CODE STATUS: PARTIAL -Defer to sister Sherryle Lis if patients lacks decision-making capacity -Confirmed on 10/28/14 that pt wants all resuscitative measures [intubation, pressors, antiarrhythmics] EXCEPT CPR  Dispo: Disposition is deferred at this time, awaiting improvement of current medical problems. -Will need goals of care meeting with family  as his co-morbidities limit interventions   LOS: 4 days   Heywood Iles, MD 10/30/2014, 7:53 AM

## 2014-10-30 NOTE — Progress Notes (Signed)
[facility]    Subjective: Pt would not verbalize anything to me   Antibiotics:  Anti-infectives    Start     Dose/Rate Route Frequency Ordered Stop   10/28/14 1200  vancomycin (VANCOCIN) 500 mg in sodium chloride 0.9 % 100 mL IVPB     500 mg100 mL/hr over 60 Minutes Intravenous Every T-Th-Sa (Hemodialysis) 10/27/14 0625     10/28/14 1200  cefTRIAXone (ROCEPHIN) 1 g in dextrose 5 % 50 mL IVPB - Premix  Status:  Discontinued     1 g100 mL/hr over 30 Minutes Intravenous Every 24 hours 10/28/14 1140 10/29/14 1109   10/27/14 0630  vancomycin (VANCOCIN) 1,250 mg in sodium chloride 0.9 % 250 mL IVPB     1,250 mg166.7 mL/hr over 90 Minutes Intravenous  Once 10/27/14 0625 10/27/14 0944   10/27/14 0630  piperacillin-tazobactam (ZOSYN) IVPB 2.25 g  Status:  Discontinued     2.25 g100 mL/hr over 30 Minutes Intravenous 3 times per day 10/27/14 0625 10/28/14 1140      Medications: Scheduled Meds: . atorvastatin  20 mg Oral QHS  . Chlorhexidine Gluconate Cloth  6 each Topical Q0600  . cinacalcet  30 mg Oral Q breakfast  . [START ON 11/02/2014] darbepoetin (ARANESP) injection - DIALYSIS  100 mcg Intravenous Q Thu-HD  . doxercalciferol  3 mcg Oral Q T,Th,Sa-HD  . enoxaparin (LOVENOX) injection  60 mg Subcutaneous Q24H  . hydrocortisone sod succinate (SOLU-CORTEF) inj  50 mg Intravenous Q6H  . midodrine  15 mg Oral TID WC  . multivitamin  1 tablet Oral QHS  . mupirocin ointment  1 application Nasal BID  . pantoprazole  40 mg Oral Daily  . phenytoin  250 mg Oral QHS  . sevelamer carbonate  800 mg Oral TID WC  . sodium chloride  1,000 mL Intravenous Once  . vancomycin  500 mg Intravenous Q T,Th,Sa-HD   Continuous Infusions:  PRN Meds:.acetaminophen **OR** acetaminophen    Objective: Weight change: -2 lb 6.8 oz (-1.1 kg)  Intake/Output Summary (Last 24 hours) at 10/30/14 1323 Last data filed at 10/30/14 0830  Gross per 24 hour  Intake      0 ml  Output       0 ml  Net      0 ml   Blood pressure 98/67, pulse 95, temperature 98.6 F (37 C), temperature source Oral, resp. rate 18, height 5' (1.524 m), weight 133 lb 9.6 oz (60.6 kg), SpO2 95 %. Temp:  [98.1 F (36.7 C)-98.6 F (37 C)] 98.6 F (37 C) (01/11 0602) Pulse Rate:  [95-100] 95 (01/11 0602) Resp:  [17-18] 18 (01/11 0602) BP: (85-98)/(56-67) 98/67 mmHg (01/11 0602) SpO2:  [93 %-98 %] 95 % (01/11 0602) Weight:  [133 lb 9.6 oz (60.6 kg)] 133 lb 9.6 oz (60.6 kg) (01/10 2125)  Physical Exam: General: Alert and awake gazing leftwards, nonverbal HEENT:  EOMI CVS regular rate, normal r,  no murmur rubs or gallops Chest: clear to auscultation bilaterally, no wheezing, rales or rhonchi Abdomen: softnondistended, normal bowel sounds, Extremities: no  clubbing or edema noted bilaterally Skin:   He has induration around his right femoral dialysis catheter see picture below 10/30/2014:         Picture of thigh graft where there is thrombosis 10/30/2014     Neuro: nonfocal  CBC:  CBC Latest Ref Rng 10/28/2014 10/27/2014 10/26/2014  WBC 4.0 - 10.5 K/uL 6.7 8.1 9.4  Hemoglobin 13.0 - 17.0 g/dL  8.5(L) 8.5(L) 8.9(L)  Hematocrit 39.0 - 52.0 % 28.1(L) 27.7(L) 29.0(L)  Platelets 150 - 400 K/uL 172 183 163      BMET  Recent Labs  10/28/14 0730  NA 140  K 4.6  CL 103  CO2 19  GLUCOSE 159*  BUN 39*  CREATININE 8.24*  CALCIUM 8.2*     Liver Panel  No results for input(s): PROT, ALBUMIN, AST, ALT, ALKPHOS, BILITOT, BILIDIR, IBILI in the last 72 hours.     Sedimentation Rate No results for input(s): ESRSEDRATE in the last 72 hours. C-Reactive Protein No results for input(s): CRP in the last 72 hours.  Micro Results: Recent Results (from the past 720 hour(s))  Blood culture (routine x 2)     Status: None   Collection Time: 10/26/14  5:59 PM  Result Value Ref Range Status   Specimen Description BLOOD ARM LEFT  Final   Special Requests BOTTLES DRAWN AEROBIC AND  ANAEROBIC 5CC  Final   Culture   Final    METHICILLIN RESISTANT STAPHYLOCOCCUS AUREUS Note: SUSCEPTIBILITIES PERFORMED ON PREVIOUS CULTURE WITHIN THE LAST 5 DAYS. Note: Gram Stain Report Called to,Read Back By and Verified With: Caesar Bookman 10/27/14 1120 BY SMITHERSJ Performed at [facility]    Report Status 10/29/2014 FINAL  Final  Blood culture (routine x 2)     Status: None   Collection Time: 10/26/14  6:05 PM  Result Value Ref Range Status   Specimen Description BLOOD HAND RIGHT  Final   Special Requests BOTTLES DRAWN AEROBIC AND ANAEROBIC 3CC  Final   Culture   Final    METHICILLIN RESISTANT STAPHYLOCOCCUS AUREUS Note: RIFAMPIN AND GENTAMICIN SHOULD NOT BE USED AS SINGLE DRUGS FOR TREATMENT OF STAPH INFECTIONS. This organism DOES NOT demonstrate inducible Clindamycin resistance in vitro. CRITICAL RESULT CALLED TO, READ BACK BY AND VERIFIED WITH: JASMINE ADAMS  10/29/14 @ 9:41AM BY RUSCOE A. Note: Gram Stain Report Called to,Read Back By and Verified With: Caesar Bookman 10/27/14 1120 BY SMITHERSJ Performed at [facility]    Report Status 10/29/2014 FINAL  Final   Organism ID, Bacteria METHICILLIN RESISTANT STAPHYLOCOCCUS AUREUS  Final      Susceptibility   Methicillin resistant staphylococcus aureus - MIC*    CLINDAMYCIN <=0.25 SENSITIVE Sensitive     ERYTHROMYCIN >=8 RESISTANT Resistant     GENTAMICIN <=0.5 SENSITIVE Sensitive     LEVOFLOXACIN <=0.12 SENSITIVE Sensitive     OXACILLIN >=4 RESISTANT Resistant     PENICILLIN >=0.5 RESISTANT Resistant     RIFAMPIN <=0.5 SENSITIVE Sensitive     TRIMETH/SULFA <=10 SENSITIVE Sensitive     VANCOMYCIN 1 SENSITIVE Sensitive     TETRACYCLINE <=1 SENSITIVE Sensitive     * METHICILLIN RESISTANT STAPHYLOCOCCUS AUREUS  MRSA PCR Screening     Status: Abnormal   Collection Time: 10/27/14  3:23 AM  Result Value Ref Range Status   MRSA by PCR POSITIVE (A) NEGATIVE Final    Comment:        The GeneXpert MRSA Assay  (FDA approved for NASAL specimens only), is one component of a comprehensive MRSA colonization surveillance program. It is not intended to diagnose MRSA infection nor to guide or monitor treatment for MRSA infections. RESULT CALLED TO, READ BACK BY AND VERIFIED WITH: CALLED TO RN [facility] TEMBRINA [id number]  THANEY   Culture, blood (routine x 2)     Status: None (Preliminary result)   Collection Time: 10/27/14 11:10 AM  Result Value Ref Range Status  Specimen Description BLOOD HEMODIALYSIS CATHETER  Final   Special Requests BOTTLES DRAWN AEROBIC AND ANAEROBIC 5CC  Final   Culture   Final           BLOOD CULTURE RECEIVED NO GROWTH TO DATE CULTURE WILL BE HELD FOR 5 DAYS BEFORE ISSUING A FINAL NEGATIVE REPORT Performed at [facility]    Report Status PENDING  Incomplete    Studies/Results: No results found.    Assessment/Plan:  Principal Problem:   MRSA bacteremia Active Problems:   Hyperlipidemia   Chronic anemia   Mental retardation   Chronic hypotension   ESRD (end stage renal disease)   Secondary renal hyperparathyroidism   Seizure disorder   Acute encephalopathy   GERD (gastroesophageal reflux disease)   History of adrenal insufficiency   VTE (venous thromboembolism)   Sepsis due to Staphylococcus aureus   Chronic diastolic CHF (congestive heart failure)    [name] is a 64 y.o. male with  Mental retardation, ESRD on HD, difficulty with HD access sites, thrombosed AV graft on left, and complicated fluid collection near thrombosed vessels on the right. His Fem cath entry site is markedly indurated and I suspect this entire area is overtly infected in addition to the fact that the catheter is by definition infected with MRSA       [facility] Staphylococcus aureus bacteremia   Staphylococcus aureus bacteremia (SAB) is associated with a high rate of complications and mortality.  Specific aspects of clinical  management are critical to optimizing the outcome of patients with SAB.  Therefore, the [facility]imicrobial Management [facility]) has initiated an intervention aimed at improving the management of SAB at [facility].  To do so, Infectious Diseases physicians are providing an evidence-based consult for the management of all patients with SAB.     Yes No Comments  Perform follow-up blood cultures (even if the patient is afebrile) to ensure clearance of bacteremia    10/27/2014 cultures are been obtained   Remove vascular catheter and obtain follow-up blood cultures after the removal of the catheter    this is one of them largest issues for this patient. Apparently he has no more access sites left but his Vascular  catheter to me appears  overtly infected given amount of induration around site and live collections seen on CT scan    "Treating through the catheter" and principal never works for Staphylococcus aureus bacteremia  but certainly is not going to work here in my opinion   Perform echocardiography to evaluate for endocarditis (transthoracic ECHO is 40-50% sensitive, TEE is > 90% sensitive)   Please keep in mind, that neither test can definitively EXCLUDE endocarditis, and that should clinical suspicion remain high for endocarditis the patient should then still be treated with an "endocarditis" duration of therapy = 6 weeks   He has a large thrombus which certainly could be superinfected    Consult electrophysiologist to evaluate implanted cardiac device (pacemaker, ICD)    not applicable   Ensure source control   Have all abscesses been drained effectively? Have deep seeded infections (septic joints or osteomyelitis) had appropriate surgical debridement?   No source of his infection is undoubtedly has catheter and potentially soft tissue around it and even deeper tissues.   Investigate for "metastatic" sites of infection   Does the patient have ANY  symptom or physical exam finding that would suggest a deeper infection (back or neck pain that may be suggestive of  vertebral osteomyelitis or epidural abscess, muscle pain that could be a symptom of pyomyositis)?  Keep in mind that for deep seeded infections MRI imaging with contrast is preferred rather than other often insensitive tests such as plain x-rays, especially early in a patient's presentation.   Given his inability to speak much and is impaired cognitive state I am having a great of difficulty writing to ascertain if he has any distant sites of metastatic infection   Change antibiotic therapy to vancomycin  []  []  Beta-lactam antibiotics are preferred for MSSA due to higher cure rates.   If on Vancomycin, goal trough should be 15 - 20 mcg/mL  Estimated duration of IV antibiotic therapy:    If we continue to pursue aggressive treatment I would try to treat him for 8 weeks but I think this is going to be futile and the patient in which the source of the infection is not removable and which there is likely, located infection around the soft tissue around the dialysis catheter  []  []  Consult case management for probably prolonged outpatient IV antibiotic therapy   I would STRONGLY RECOMMEND PALLIATIVE CARE CONSULT     LOS: 4 days   Acey Lav 10/30/2014, 1:23 PM

## 2014-10-31 LAB — CBC
HCT: 26.8 % — ABNORMAL LOW (ref 39.0–52.0)
Hemoglobin: 8.2 g/dL — ABNORMAL LOW (ref 13.0–17.0)
MCH: 28.8 pg (ref 26.0–34.0)
MCHC: 30.6 g/dL (ref 30.0–36.0)
MCV: 94 fL (ref 78.0–100.0)
Platelets: 274 10*3/uL (ref 150–400)
RBC: 2.85 MIL/uL — ABNORMAL LOW (ref 4.22–5.81)
RDW: 19 % — AB (ref 11.5–15.5)
WBC: 5.8 10*3/uL (ref 4.0–10.5)

## 2014-10-31 LAB — RENAL FUNCTION PANEL
Albumin: 2.5 g/dL — ABNORMAL LOW (ref 3.5–5.2)
Anion gap: 13 (ref 5–15)
BUN: 24 mg/dL — AB (ref 6–23)
CO2: 25 mmol/L (ref 19–32)
Calcium: 8.8 mg/dL (ref 8.4–10.5)
Chloride: 101 mEq/L (ref 96–112)
Creatinine, Ser: 3.42 mg/dL — ABNORMAL HIGH (ref 0.50–1.35)
GFR calc Af Amer: 20 mL/min — ABNORMAL LOW (ref >90)
GFR calc non Af Amer: 17 mL/min — ABNORMAL LOW (ref >90)
Glucose, Bld: 93 mg/dL (ref 70–99)
Phosphorus: 2.2 mg/dL — ABNORMAL LOW (ref 2.3–4.6)
Potassium: 2.9 mmol/L — ABNORMAL LOW (ref 3.5–5.1)
Sodium: 139 mmol/L (ref 135–145)

## 2014-10-31 MED ORDER — MIDODRINE HCL 5 MG PO TABS
ORAL_TABLET | ORAL | Status: AC
  Filled 2014-10-31: qty 3

## 2014-10-31 MED ORDER — POTASSIUM CHLORIDE CRYS ER 20 MEQ PO TBCR
20.0000 meq | EXTENDED_RELEASE_TABLET | Freq: Two times a day (BID) | ORAL | Status: AC
  Filled 2014-10-31: qty 1

## 2014-10-31 MED ORDER — DOXERCALCIFEROL 4 MCG/2ML IV SOLN
INTRAVENOUS | Status: AC
  Administered 2014-10-31: 3 ug
  Filled 2014-10-31: qty 2

## 2014-10-31 NOTE — Progress Notes (Signed)
Pts sister in room waiting for palliative care consult.  Paged Dr. Greig RightLampkin for consult.  Sister in room.

## 2014-10-31 NOTE — Progress Notes (Addendum)
I visited [name] along with Dr. Criselda PeachesMullen. His sisters [name] and Cookie were present at bedside.  They reported they had had multiple prior family meetings and reported what they understood thus far about his condition. I then asked the family what he enjoys doing most at home to get a better sense of how he is at home. [name] reports that at home, he is able to eat and move around the house without problem. He enjoys social contact and being around others and understands all that is said in the room.  I then summarized the hospital course to them. I emphasized that [name] is in a very delicate position right now with his health given the MRSA infection, IVC thrombosis, and dialysis complicated by limited access options   As a medical provider, I explained that my job is to make sure the treatments match up with the patient's goals and that our current plan was aligned to those wishes.   I asked for their permission to suggest options to which they agreed:  1) Allow for IR to assess him for transhepatic or translumbar access  2) Focus on symptom management while continuing to treat the infection through his current HD catheter knowing that he won't fully clear the infection  [name] did not find the first option appealing at all as it would interfere with her brother's comfort. They reported having multiple family meetings in the past and would like to think before discussing further. They also reported that they feel his needs are neglected in the hospital and would appreciate if he is mobilized more as well as have a sitter at night given his agitation.   Overall, they would like for their brother to be kept comfortable and not neglected. Code status was not revisited in this discussion.  I will follow-up with [name] tomorrow by phone.

## 2014-10-31 NOTE — Progress Notes (Addendum)
Attempted to give meds,patient continues to turn head ,raise arm  So as not to receive medication. Will attempt at later time.

## 2014-10-31 NOTE — Consult Note (Signed)
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 No EGD down, bleeding stopped. Chronically low BP's 12/14 - hypotension, CG emesis > ICU on pressors, transfused for anemia. CT abd neg. Patient started to refuse labs and meds. Family noted this was typical for him when in hospital for a long time. Improved and dc'd home.  6/15 - revision/ thrombectomy of AV graft c/b femoral art injury requirin patching; blood loss required transfusion. R groin TDC placed.  Midodrine for low BP's.  7/15 - acute change in MS > hypotension, Pseudomonas bacteremia rx with Abx.  The femoral HD cath was removed and replaced.  Improved and dc'd home. ESRD on HD.   Vinson Moselleob Maye Parkinson MD (pgr) [phone]    ([phone]) [phone] 10/31/2014, 10:12 AM

## 2014-10-31 NOTE — Procedures (Signed)
I was present at this dialysis session, have reviewed the session itself and made  appropriate changes  Vinson Moselleob Rahima Fleishman MD (pgr) [phone]    ([phone]) [phone] 10/31/2014, 10:17 AM

## 2014-10-31 NOTE — Progress Notes (Signed)
Medicare Important Message given? YES  (If response is "NO", the following Medicare IM given date fields will be blank)  Date Medicare IM given:  10/31/2014 Medicare IM given by: Laberta Wilbon 

## 2014-10-31 NOTE — Progress Notes (Signed)
Date: 10/31/2014  Patient name: [name]  Medical record number: [id number]  Date of birth: 03-Dec-1950   This patient's plan of care was discussed with the house staff. Please see Dr. Eliane Decree note for complete details. I concur with his findings.  I was also present for family meeting this afternoon.  Blood was obtained in HD for repeat cultures and to follow BMET/CBC.    Inez Catalina, MD 10/31/2014, 8:38 PM

## 2014-10-31 NOTE — Progress Notes (Signed)
Subjective: This AM, he is getting dialysis and is no apparent distress. His two sisters and caregiver will be coming later today for a goals of care meeting.  Objective: Vital signs in last 24 hours: Filed Vitals:   10/31/14 0700 10/31/14 0730 10/31/14 0800 10/31/14 0830  BP: 88/57  Pulse: 94 94 92 94  Temp:      TempSrc:      Resp:      Height:      Weight:      SpO2:       Weight change: 0 lb (0 kg)  Intake/Output Summary (Last 24 hours) at 10/31/14 0911 Last data filed at 10/30/14 1831  Gross per 24 hour  Intake      0 ml  Output      0 ml  Net      0 ml   General: resting in bed, NAD, looking around the room HEENT: PERRL, EOMI, no scleral icterus, crusty nares Cardiac: RRR, no rubs, murmurs or gallops Pulm: upper airway sounds present on anterior auscultation Abd: soft, nontender, nondistended, BS present, variceal cords present along edge of upper quadrants Ext: L AV graft scarred, R HD cath covered by bandage without drainage, intact draining blood Neuro: moving all extremities freely sometimes to stimuli or questions  Lab Results: Basic Metabolic Panel:  Recent Labs Lab 10/26/14 1759 10/27/14 0650 10/28/14 0730  NA 139  --  140  K 3.6  --  4.6  CL 99  --  103  CO2 27  --  19  GLUCOSE 101*  --  159*  BUN 12  --  39*  CREATININE 4.36*  --  8.24*  CALCIUM 8.5  --  8.2*  MG  --  2.0  --   PHOS  --  5.8*  --    CBC:  Recent Labs Lab 10/26/14 1759  10/28/14 0730 10/31/14 0500  WBC 9.4  < > 6.7 5.8  NEUTROABS 8.1*  --   --   --   HGB 8.9*  < > 8.5* 8.2*  HCT 29.0*  < > 28.1* 26.8*  MCV 97.3  < > 96.6 94.0  PLT 163  < > 172 274  < > = values in this interval not displayed. Cardiac Enzymes:  Recent Labs Lab 10/27/14 0650  TROPONINI 0.03   CBG  Recent Labs Lab 10/28/14 0211  GLUCAP 124*   Fasting Lipid Panel:  Recent Labs Lab 10/27/14 0650  CHOL 137  HDL 57  LDLCALC 44  TRIG 180*  CHOLHDL 2.4   Thyroid  Function Tests:  Recent Labs Lab 10/27/14 0650  TSH 4.382   Coagulation:  Recent Labs Lab 10/27/14 0650  LABPROT 16.9*  INR 1.36    Micro Results: Recent Results (from the past 240 hour(s))  Blood culture (routine x 2)     Status: None   Collection Time: 10/26/14  5:59 PM  Result Value Ref Range Status   Specimen Description BLOOD ARM LEFT  Final   Special Requests BOTTLES DRAWN AEROBIC AND ANAEROBIC 5CC  Final   Culture   Final    METHICILLIN RESISTANT STAPHYLOCOCCUS AUREUS Note: SUSCEPTIBILITIES PERFORMED ON PREVIOUS CULTURE WITHIN THE LAST 5 DAYS. Note: Gram Stain Report Called to,Read Back By and Verified With: Caesar Bookman 10/27/14 1120 BY SMITHERSJ Performed at [facility]    Report Status 10/29/2014 FINAL  Final  Blood culture (routine x 2)     Status: None   Collection  Time: 10/26/14  6:05 PM  Result Value Ref Range Status   Specimen Description BLOOD HAND RIGHT  Final   Special Requests BOTTLES DRAWN AEROBIC AND ANAEROBIC 3CC  Final   Culture   Final    METHICILLIN RESISTANT STAPHYLOCOCCUS AUREUS Note: RIFAMPIN AND GENTAMICIN SHOULD NOT BE USED AS SINGLE DRUGS FOR TREATMENT OF STAPH INFECTIONS. This organism DOES NOT demonstrate inducible Clindamycin resistance in vitro. CRITICAL RESULT CALLED TO, READ BACK BY AND VERIFIED WITH: JASMINE ADAMS  10/29/14 @ 9:41AM BY RUSCOE A. Note: Gram Stain Report Called to,Read Back By and Verified With: Caesar Bookman 10/27/14 1120 BY SMITHERSJ Performed at [facility]    Report Status 10/29/2014 FINAL  Final   Organism ID, Bacteria METHICILLIN RESISTANT STAPHYLOCOCCUS AUREUS  Final      Susceptibility   Methicillin resistant staphylococcus aureus - MIC*    CLINDAMYCIN <=0.25 SENSITIVE Sensitive     ERYTHROMYCIN >=8 RESISTANT Resistant     GENTAMICIN <=0.5 SENSITIVE Sensitive     LEVOFLOXACIN <=0.12 SENSITIVE Sensitive     OXACILLIN >=4 RESISTANT Resistant     PENICILLIN >=0.5 RESISTANT Resistant      RIFAMPIN <=0.5 SENSITIVE Sensitive     TRIMETH/SULFA <=10 SENSITIVE Sensitive     VANCOMYCIN 1 SENSITIVE Sensitive     TETRACYCLINE <=1 SENSITIVE Sensitive     * METHICILLIN RESISTANT STAPHYLOCOCCUS AUREUS  MRSA PCR Screening     Status: Abnormal   Collection Time: 10/27/14  3:23 AM  Result Value Ref Range Status   MRSA by PCR POSITIVE (A) NEGATIVE Final    Comment:        The GeneXpert MRSA Assay (FDA approved for NASAL specimens only), is one component of a comprehensive MRSA colonization surveillance program. It is not intended to diagnose MRSA infection nor to guide or monitor treatment for MRSA infections. RESULT CALLED TO, READ BACK BY AND VERIFIED WITH: CALLED TO RN Junius Argyle [id number]  THANEY   Culture, blood (routine x 2)     Status: None (Preliminary result)   Collection Time: 10/27/14 11:10 AM  Result Value Ref Range Status   Specimen Description BLOOD HEMODIALYSIS CATHETER  Final   Special Requests BOTTLES DRAWN AEROBIC AND ANAEROBIC 5CC  Final   Culture   Final           BLOOD CULTURE RECEIVED NO GROWTH TO DATE CULTURE WILL BE HELD FOR 5 DAYS BEFORE ISSUING A FINAL NEGATIVE REPORT Performed at [facility]    Report Status PENDING  Incomplete   Studies/Results: No results found. Medications: I have reviewed the patient's current medications. Scheduled Meds: . atorvastatin  20 mg Oral QHS  . Chlorhexidine Gluconate Cloth  6 each Topical Q0600  . cinacalcet  30 mg Oral Q breakfast  . [START ON 11/02/2014] darbepoetin (ARANESP) injection - DIALYSIS  100 mcg Intravenous Q Thu-HD  . doxercalciferol  3 mcg Oral Q T,Th,Sa-HD  . enoxaparin (LOVENOX) injection  60 mg Subcutaneous Q24H  . hydrocortisone sod succinate (SOLU-CORTEF) inj  50 mg Intravenous Q6H  . midodrine  15 mg Oral TID WC  . multivitamin  1 tablet Oral QHS  . mupirocin ointment  1 application Nasal BID  . pantoprazole  40 mg Oral Daily  . phenytoin  250 mg Oral QHS  . sevelamer  carbonate  800 mg Oral TID WC  . sodium chloride  1,000 mL Intravenous Once  . vancomycin  500 mg Intravenous Q T,Th,Sa-HD   Continuous Infusions:  PRN Meds:.acetaminophen **OR** acetaminophen  Assessment/Plan:  MRSA bacteremia: Likely source R femoral cath though cultures with NGTD x 4 days from blood drawn there. ID suspects there might be deeper & greater local involvement which warrants longer course of antibiotics though feels he has a poor prognosis overall. TTE 1/9 without valvular vegetations.  -Abx Day 5: continue vancomycin -Redraw cultures and procalcitonin while he is in HD today -Attempt to reach his caregiver again today    IVC thrombus: Visualized on TTE 1/9 with thrombus that extends into RA.  -Continue Lovenox per Pharmacy, currently 60mg  daily with likely indefinite duration given immobility  ESRD on HD: T/H/Sa schedule. HD access is an issue as L femoral vein is occluded, R site presumably source of infection though may be used. Vascular would not attempt other access and would defer to IR for possible translumbar or transhepatic TDC placement.  -Continue home Sensipar, Renvela, Dialyvite -Nephrology & Vascular following, appreciate recs.   Chronic hypotension: Systolic BP trending mostly 80s as he has not been on hydrocortisone 50mg  off IV access. -Continue home midodrine 15mg  TID -Continue hydrocortisone 50mg  q6h  Seizure disorder: EEG 10/27/2013 unremarkable. -Continue phenytoin  Anemia of chronic disease, with ESRD: Stable at 8.2 from 1/9, baseline 8-9.  -Continue assessing as he agrees with labwork  GERD: Continue home Protonix  Hyperlipidemia: Continue home lipitor 20mg   FEN:  -Diet: Dysphagia I  DVT prophylaxis: heparin 5000 units subcutaneous  CODE STATUS: PARTIAL -Defer to sister Sherryle LisJanie [HCPOA] if patients lacks decision-making capacity -Confirmed on 10/28/14 with her that patient would want all resuscitative measures [intubation, pressors,  antiarrhythmics] EXCEPT CPR  Dispo: Disposition is deferred at this time, awaiting improvement of current medical problems. -Plan goals of care meeting with family as his co-morbidities limit interventions today   LOS: 5 days   Heywood Ilesushil Jaiceon Collister, MD 10/31/2014, 9:11 AM

## 2014-10-31 NOTE — Progress Notes (Signed)
Subjective:  [name] was nonverbal (baseline) and appeared comfortable in HD. He was alert and waved good morning.    Objective Filed Vitals:   10/31/14 0830  BP: 88/57  Pulse: 94  Temp:   Resp:     Physical Exam General: Awake and alert. Nonverbal at baseline. No acute distress.  Heart: Regular rate and rhythm without murmur or rub.  Lungs: Clear to ascultation. Unlabored without use of accessory muscles.  Abdomen: soft, non tender non distended, Bowel sounds present.  Extremities: No edema, Pedal pulses present.  Dialysis Access: R femoral cath currently in use for HD, dressing dry, indurated.   Dialysis Orders: [facility] . EDW 57kg 2.o 2.oca HD Time 3hr 45min Heparin 3500. Access r fem Perm cath/ Hectorol 3mcg IV/HD Aranesp 100mcg qweeklyUnits IV/HD Venofer 100mcg load stop 11/07/14   Assessment/Plan 1. MRSA cath-related bacteremia - Echo 1/10 showed no vegetation on valves. Abx: vanc, rocephin discontinued. Primary team repeating blood cultures. Limited access sites available, recommend treating with IV Abx and leaving cath in for now.  If recurs or he deteriorates from the infection will have to reconsider.   2. 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 started on levenox. Will need plan for long term anticoagulation with warfarin after discharge. 3. ESRD - HD TTS,  Currently in HD. K 4.6 (1/9), Kidney labs drawn today during HD will monitor for results.  4. Hypertension/volume- BP 88/57. On Midodrine 15 mg TID. Wt 59.6 kg, no volume excess. Adjust EDW upon discharge. 5. Anemia - Continue Aranesp 100 mcg every Thurday, Hgb 8.2 (1/12) 6. Metabolic bone disease - Continue Renvela, hectorol, sensipar. Ca 8.2 (1/9) and phos 5.8(1/8). Kidney labs drawn today during HD will monitor for results.  7. Nutrition - Diet DYS 1 , Rena- Vit 8. End stage HD Access- Vascular surgery consulted and agree that the patient is end access and doubts L femoral  cath will be possible. Reports future options as translumbar or transhepatic. Recommends palliative care. Discuss with power of attorney next steps due to recurrent infections to HD access.  9. History of Seizure disorder - EEG completed 1/8, results pending.   Levonne HubertKelly Michelle Young PA Student   Pt seen, examined and agree w A/P as above. Vinson Moselleob Deaisha Welborn MD pager [phone]    cell [phone] 10/31/2014, 9:53 AM      Additional Objective Labs: BMP Latest Ref Rng 10/28/2014 10/26/2014 05/09/2014  Glucose 70 - 99 mg/dL 478(G159(H) 956(O101(H) -  BUN 6 - 23 mg/dL 13(Y39(H) 12 -  Creatinine 0.50 - 1.35 mg/dL 8.65(H8.24(H) 8.46(N4.36(H) -  Sodium 135 - 145 mmol/L 140 139 137  Potassium 3.5 - 5.1 mmol/L 4.6 3.6 4.5  Chloride 96 - 112 mEq/L 103 99 -  CO2 19 - 32 mmol/L 19 27 -  Calcium 8.4 - 10.5 mg/dL 8.2(L) 8.5 -    CBC    Component Value Date/Time   WBC 5.8 10/31/2014 0500   RBC 2.85* 10/31/2014 0500   RBC 2.30* 04/17/2014 0800   HGB 8.2* 10/31/2014 0500   HCT 26.8* 10/31/2014 0500   PLT 274 10/31/2014 0500   MCV 94.0 10/31/2014 0500   MCH 28.8 10/31/2014 0500   MCHC 30.6 10/31/2014 0500   RDW 19.0* 10/31/2014 0500   LYMPHSABS 0.6* 10/26/2014 1759   MONOABS 0.7 10/26/2014 1759   EOSABS 0.0 10/26/2014 1759   BASOSABS 0.0 10/26/2014 1759    Blood Culture    Component Value Date/Time   SDES BLOOD HEMODIALYSIS  CATHETER 10/27/2014 1110   SPECREQUEST BOTTLES DRAWN AEROBIC AND ANAEROBIC 5CC 10/27/2014 1110   CULT  10/27/2014 1110           BLOOD CULTURE RECEIVED NO GROWTH TO DATE CULTURE WILL BE HELD FOR 5 DAYS BEFORE ISSUING A FINAL NEGATIVE REPORT Performed at [facility]    REPTSTATUS PENDING 10/27/2014 1110    Cardiac Enzymes  Lab Results  Component Value Date   CKTOTAL 72 11/22/2010   CKMB 1.4 11/22/2010   TROPONINI 0.03 10/27/2014   CBG (last 3)  No results for input(s): GLUCAP in the last 72 hours.   Medications:   Scheduled Meds: . atorvastatin  20 mg Oral QHS  .  Chlorhexidine Gluconate Cloth  6 each Topical Q0600  . cinacalcet  30 mg Oral Q breakfast  . [START ON 11/02/2014] darbepoetin (ARANESP) injection - DIALYSIS  100 mcg Intravenous Q Thu-HD  . doxercalciferol  3 mcg Oral Q T,Th,Sa-HD  . enoxaparin (LOVENOX) injection  60 mg Subcutaneous Q24H  . hydrocortisone sod succinate (SOLU-CORTEF) inj  50 mg Intravenous Q6H  . midodrine  15 mg Oral TID WC  . multivitamin  1 tablet Oral QHS  . mupirocin ointment  1 application Nasal BID  . pantoprazole  40 mg Oral Daily  . phenytoin  250 mg Oral QHS  . sevelamer carbonate  800 mg Oral TID WC  . sodium chloride  1,000 mL Intravenous Once  . vancomycin  500 mg Intravenous Q T,Th,Sa-HD   Continuous Infusions:  PRN Meds:.acetaminophen **OR** acetaminophen

## 2014-10-31 NOTE — Progress Notes (Addendum)
[facility]    Subjective: Pt would not verbalize anything to me   Antibiotics:  Anti-infectives    Start     Dose/Rate Route Frequency Ordered Stop   10/28/14 1200  vancomycin (VANCOCIN) 500 mg in sodium chloride 0.9 % 100 mL IVPB     500 mg100 mL/hr over 60 Minutes Intravenous Every T-Th-Sa (Hemodialysis) 10/27/14 0625     10/28/14 1200  cefTRIAXone (ROCEPHIN) 1 g in dextrose 5 % 50 mL IVPB - Premix  Status:  Discontinued     1 g100 mL/hr over 30 Minutes Intravenous Every 24 hours 10/28/14 1140 10/29/14 1109   10/27/14 0630  vancomycin (VANCOCIN) 1,250 mg in sodium chloride 0.9 % 250 mL IVPB     1,250 mg166.7 mL/hr over 90 Minutes Intravenous  Once 10/27/14 0625 10/27/14 0944   10/27/14 0630  piperacillin-tazobactam (ZOSYN) IVPB 2.25 g  Status:  Discontinued     2.25 g100 mL/hr over 30 Minutes Intravenous 3 times per day 10/27/14 0625 10/28/14 1140      Medications: Scheduled Meds: . atorvastatin  20 mg Oral QHS  . cinacalcet  30 mg Oral Q breakfast  . [START ON 11/02/2014] darbepoetin (ARANESP) injection - DIALYSIS  100 mcg Intravenous Q Thu-HD  . doxercalciferol  3 mcg Oral Q T,Th,Sa-HD  . enoxaparin (LOVENOX) injection  60 mg Subcutaneous Q24H  . hydrocortisone sod succinate (SOLU-CORTEF) inj  50 mg Intravenous Q6H  . midodrine  15 mg Oral TID WC  . multivitamin  1 tablet Oral QHS  . mupirocin ointment  1 application Nasal BID  . pantoprazole  40 mg Oral Daily  . phenytoin  250 mg Oral QHS  . potassium chloride  20 mEq Oral BID  . sodium chloride  1,000 mL Intravenous Once  . vancomycin  500 mg Intravenous Q T,Th,Sa-HD   Continuous Infusions:  PRN Meds:.acetaminophen **OR** acetaminophen    Objective: Weight change: 0 lb (0 kg)  Intake/Output Summary (Last 24 hours) at 10/31/14 1415 Last data filed at 10/31/14 1030  Gross per 24 hour  Intake      0 ml  Output    264 ml  Net   -264 ml   Blood pressure 93/70, pulse 90, temperature  98.2 F (36.8 C), temperature source Oral, resp. rate 20, height 5' (1.524 m), weight 129 lb 10.1 oz (58.8 kg), SpO2 92 %. Temp:  [97.3 F (36.3 C)-98.2 F (36.8 C)] 98.2 F (36.8 C) (01/12 1100) Pulse Rate:  [86-98] 90 (01/12 1100) Resp:  [16-20] 20 (01/12 1100) BP: (84-104)/(46-70) 93/70 mmHg (01/12 1100) SpO2:  [92 %-96 %] 92 % (01/12 1100) Weight:  [129 lb 10.1 oz (58.8 kg)-133 lb 9.6 oz (60.6 kg)] 129 lb 10.1 oz (58.8 kg) (01/12 1030)  Physical Exam: General: Alert and awake gazing leftwards, nonverbal HEENT:  EOMI CVS regular rate, normal r,  no murmur rubs or gallops Chest: clear to auscultation bilaterally, no wheezing, rales or rhonchi Abdomen: softnondistended, normal bowel sounds, Extremities: no  clubbing or edema noted bilaterally Skin:   He has induration around his right femoral dialysis catheter see picture below 10/30/2014:         Picture of thigh graft where there is thrombosis 10/30/2014     Neuro: nonfocal  CBC:  CBC Latest Ref Rng 10/31/2014 10/28/2014 10/27/2014  WBC 4.0 - 10.5 K/uL 5.8 6.7 8.1  Hemoglobin 13.0 - 17.0 g/dL 8.2(L) 8.5(L) 8.5(L)  Hematocrit 39.0 - 52.0 % 26.8(L) 28.1(L) 27.7(L)  Platelets 150 - 400 K/uL 274 172 183      BMET  Recent Labs  10/31/14 0650  NA 139  K 2.9*  CL 101  CO2 25  GLUCOSE 93  BUN 24*  CREATININE 3.42*  CALCIUM 8.8     Liver Panel   Recent Labs  10/31/14 0650  ALBUMIN 2.5*       Sedimentation Rate No results for input(s): ESRSEDRATE in the last 72 hours. C-Reactive Protein No results for input(s): CRP in the last 72 hours.  Micro Results: Recent Results (from the past 720 hour(s))  Blood culture (routine x 2)     Status: None   Collection Time: 10/26/14  5:59 PM  Result Value Ref Range Status   Specimen Description BLOOD ARM LEFT  Final   Special Requests BOTTLES DRAWN AEROBIC AND ANAEROBIC 5CC  Final   Culture   Final    METHICILLIN RESISTANT STAPHYLOCOCCUS AUREUS Note:  SUSCEPTIBILITIES PERFORMED ON PREVIOUS CULTURE WITHIN THE LAST 5 DAYS. Note: Gram Stain Report Called to,Read Back By and Verified With: Caesar Bookman 10/27/14 1120 BY SMITHERSJ Performed at [facility]    Report Status 10/29/2014 FINAL  Final  Blood culture (routine x 2)     Status: None   Collection Time: 10/26/14  6:05 PM  Result Value Ref Range Status   Specimen Description BLOOD HAND RIGHT  Final   Special Requests BOTTLES DRAWN AEROBIC AND ANAEROBIC 3CC  Final   Culture   Final    METHICILLIN RESISTANT STAPHYLOCOCCUS AUREUS Note: RIFAMPIN AND GENTAMICIN SHOULD NOT BE USED AS SINGLE DRUGS FOR TREATMENT OF STAPH INFECTIONS. This organism DOES NOT demonstrate inducible Clindamycin resistance in vitro. CRITICAL RESULT CALLED TO, READ BACK BY AND VERIFIED WITH: JASMINE ADAMS  10/29/14 @ 9:41AM BY RUSCOE A. Note: Gram Stain Report Called to,Read Back By and Verified With: Caesar Bookman 10/27/14 1120 BY SMITHERSJ Performed at [facility]    Report Status 10/29/2014 FINAL  Final   Organism ID, Bacteria METHICILLIN RESISTANT STAPHYLOCOCCUS AUREUS  Final      Susceptibility   Methicillin resistant staphylococcus aureus - MIC*    CLINDAMYCIN <=0.25 SENSITIVE Sensitive     ERYTHROMYCIN >=8 RESISTANT Resistant     GENTAMICIN <=0.5 SENSITIVE Sensitive     LEVOFLOXACIN <=0.12 SENSITIVE Sensitive     OXACILLIN >=4 RESISTANT Resistant     PENICILLIN >=0.5 RESISTANT Resistant     RIFAMPIN <=0.5 SENSITIVE Sensitive     TRIMETH/SULFA <=10 SENSITIVE Sensitive     VANCOMYCIN 1 SENSITIVE Sensitive     TETRACYCLINE <=1 SENSITIVE Sensitive     * METHICILLIN RESISTANT STAPHYLOCOCCUS AUREUS  MRSA PCR Screening     Status: Abnormal   Collection Time: 10/27/14  3:23 AM  Result Value Ref Range Status   MRSA by PCR POSITIVE (A) NEGATIVE Final    Comment:        The GeneXpert MRSA Assay (FDA approved for NASAL specimens only), is one component of a comprehensive MRSA  colonization surveillance program. It is not intended to diagnose MRSA infection nor to guide or monitor treatment for MRSA infections. RESULT CALLED TO, READ BACK BY AND VERIFIED WITH: CALLED TO RN Liborio Nixon TEMBRINA [id number]  THANEY   Culture, blood (routine x 2)     Status: None (Preliminary result)   Collection Time: 10/27/14 11:10 AM  Result Value Ref Range Status   Specimen Description BLOOD HEMODIALYSIS CATHETER  Final   Special Requests BOTTLES DRAWN AEROBIC AND ANAEROBIC 5CC  Final  Culture   Final           BLOOD CULTURE RECEIVED NO GROWTH TO DATE CULTURE WILL BE HELD FOR 5 DAYS BEFORE ISSUING A FINAL NEGATIVE REPORT Performed at [facility]    Report Status PENDING  Incomplete    Studies/Results: No results found.    Assessment/Plan:  Principal Problem:   MRSA bacteremia Active Problems:   Hyperlipidemia   Chronic anemia   Mental retardation   Chronic hypotension   ESRD (end stage renal disease)   Secondary renal hyperparathyroidism   Seizure disorder   Acute encephalopathy   GERD (gastroesophageal reflux disease)   History of adrenal insufficiency   VTE (venous thromboembolism)   Sepsis due to Staphylococcus aureus   Chronic diastolic CHF (congestive heart failure)   Atrial thrombus    [name] is a 64 y.o. male with  Mental retardation, ESRD on HD, difficulty with HD access sites, thrombosed AV graft on left, and complicated fluid collection near thrombosed vessels on the right. His Fem cath entry site is markedly indurated and I suspect this entire area is overtly infected in addition to the fact that the catheter is by definition infected with MRSA       [place] [facility] Staphylococcus aureus bacteremia   Staphylococcus aureus bacteremia (SAB) is associated with a high rate of complications and mortality.  Specific aspects of clinical management are critical to optimizing the outcome of patients with SAB.   Therefore, the [facility] Antimicrobial Management Team [facility]) has initiated an intervention aimed at improving the management of SAB at [facility].  To do so, Infectious Diseases physicians are providing an evidence-based consult for the management of all patients with SAB.     Yes No Comments  Perform follow-up blood cultures (even if the patient is afebrile) to ensure clearance of bacteremia    10/27/2014 cultures are been obtained   Remove vascular catheter and obtain follow-up blood cultures after the removal of the catheter    this is one of them largest issues for this patient. Apparently he has no more access sites left but his Vascular  catheter to me appears  overtly infected given amount of induration around site and live collections seen on CT scan    "Treating through the catheter" and principal never works for Staphylococcus aureus bacteremia  but certainly is not going to work here!   Perform echocardiography to evaluate for endocarditis (transthoracic ECHO is 40-50% sensitive, TEE is > 90% sensitive)   Please keep in mind, that neither test can definitively EXCLUDE endocarditis, and that should clinical suspicion remain high for endocarditis the patient should then still be treated with an "endocarditis" duration of therapy = 6 weeks   He has a large thrombus which certainly could be superinfected    Consult electrophysiologist to evaluate implanted cardiac device (pacemaker, ICD)    not applicable   Ensure source control   Have all abscesses been drained effectively? Have deep seeded infections (septic joints or osteomyelitis) had appropriate surgical debridement?   No source of his infection is undoubtedly has catheter and potentially soft tissue around it and even deeper tissues.   Investigate for "metastatic" sites of infection   Does the patient have ANY symptom or physical exam finding that would suggest a deeper infection (back or neck pain  that may be suggestive of vertebral osteomyelitis or epidural abscess, muscle pain that could be a symptom of pyomyositis)?  Keep in mind that for  deep seeded infections MRI imaging with contrast is preferred rather than other often insensitive tests such as plain x-rays, especially early in a patient's presentation.   Given his inability to speak much and is impaired cognitive state I am having a great of difficulty writing to ascertain if he has any distant sites of metastatic infection   Change antibiotic therapy to vancomycin  []  []  Beta-lactam antibiotics are preferred for MSSA due to higher cure rates.   If on Vancomycin, goal trough should be 15 - 20 mcg/mL  Estimated duration of IV antibiotic therapy:    If we continue to pursue aggressive treatment I would try to treat him for 8 weeks but I think this is going to be futile and the patient in which the source of the infection is not removable and which there is likely, located infection around the soft tissue around the dialysis catheter  []  []  Consult case management for probably prolonged outpatient IV antibiotic therapy   I would STRONGLY RECOMMEND PALLIATIVE CARE CONSULT     LOS: 5 days   Acey Lav 10/31/2014, 2:15 PM

## 2014-11-01 DIAGNOSIS — T827XXA Infection and inflammatory reaction due to other cardiac and vascular devices, implants and grafts, initial encounter: Secondary | ICD-10-CM | POA: Insufficient documentation

## 2014-11-01 DIAGNOSIS — G40909 Epilepsy, unspecified, not intractable, without status epilepticus: Secondary | ICD-10-CM

## 2014-11-01 MED ORDER — ENOXAPARIN SODIUM 60 MG/0.6ML ~~LOC~~ SOLN: 60.0000 mg | SUBCUTANEOUS | Status: DC

## 2014-11-01 MED ORDER — VANCOMYCIN HCL 500 MG IV SOLR: 500.0000 mg | INTRAVENOUS | Status: AC

## 2014-11-01 MED ORDER — ENOXAPARIN SODIUM 60 MG/0.6ML ~~LOC~~ SOLN
60.0000 mg | SUBCUTANEOUS | Status: DC
  Administered 2014-11-01: 60 mg via SUBCUTANEOUS
  Filled 2014-11-01: qty 0.6

## 2014-11-01 NOTE — Progress Notes (Signed)
Patient Discharge:  Disposition: Pt discharged home with caregiver  Education: Caregiver educated on all medications, follow up appointments and all discharge instructions. Caregiver verbalized understanding.   IV: N/A  Telemetry: N/A  Follow-up appointments:Reviewed with caregiver  Prescriptions: Sent to pt's pharmacy  Transportation: Transported home via car by pt's caregiver.  Belongings:All belongings taken with pt.

## 2014-11-01 NOTE — Progress Notes (Signed)
  Date: 11/01/2014  Patient name: [name]  Medical record number: [id number]  Date of birth: 05-19-1951   This patient's plan of care was discussed with the house staff. Please see Dr. Eliane DecreePatel's note for complete details. I concur with his findings.  Though LTAC was arranged, after discussion with family, it was preferable for patient to go home and receive Abx at dialysis.  He will need an 8 week course of vancomycin.  He will also need lovenox and eventual bridge to coumadin. Discharge home today.    Inez CatalinaEmily B Mullen, MD 11/01/2014, 3:32 PM

## 2014-11-01 NOTE — Progress Notes (Signed)
Subjective:   Resting in bed, nonverbal  Objective Filed Vitals:   10/31/14 1100 10/31/14 1700 10/31/14 2100 11/01/14 0500  BP: 97/58  Pulse: 90 102 108 109  Temp: 98.2 F (36.8 C) 98.5 F (36.9 C) 98.2 F (36.8 C) 99.1 F (37.3 C)  TempSrc: Oral Oral Oral Oral  Resp: Height:      Weight:   57.8 kg (127 lb 6.8 oz)   SpO2: 92% 100% 95% 95%   Physical Exam General: awake, resting in bed, appears comfortable. No acute distress.  Heart: RRR  Lungs: CTA, unlabored  Abdomen: soft, nontender +BS  Extremities: no edema Dialysis Access:  R fem cath.   Dialysis Orders: Center: [facility] on TTS . EDW 57kg 2.o 2.oca HD Time 3hr Heparin 3500. Access r fem Perm cath/ Hectorol IV/HD Aranesp qweeklyUnits IV/HD Venofer load stop 11/07/14   Assessment/Plan: 1. MRSA cath-related bacteremia - Echo 1/10 showed no vegetation on valves. On vanc. Repeat blood cultures 1/12 pending. Has limited options for new HD access if needed. Vasc surgery has seen and did not feel L thigh AVG was infected and that the L thigh AVG would be very difficult to remove anyways due to scar tissue. They felt the R thigh cath was likely source of infection.  As for next access they noted that he would not likely be a candidate for IJ/SCV cath due to central venous occlusion, and they noted that they doubted that a L femoral cath would be an option due to significant scar tissue.  They suggested that the next percutaneous access would likely have to be a translumbar or transhepatic catheter and deferred placement to IR.   2. 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 started on levenox. Will need plan for long term anticoagulation with warfarin after discharge. 3. ESRD - HD TTS. Next HD tomorrow.  4. Hypertension/volume- BP 97/58. On Midodrine 15 mg TID. no volume excess.  5. Anemia - Continue Aranesp 100 mcg every Thurday, Hgb 8.2 (1/12) 6. Metabolic  bone disease - Continue Renvela, hectorol, sensipar. Ca 8.2 (1/9) and phos 5.8(1/8) 7. Nutrition - Diet DYS 1 , Rena- Vit 8. End stage HD Access- Vascular surgery consulted and agree that the patient is end access and doubts L femoral cath will be possible. Reports future options as translumbar or transhepatic. Recommends palliative care. Discuss with power of attorney next steps due to recurrent infections to HD access.  9. History of Seizure disorder - EEG completed 1/8  [name] Duhamel, NP [facility] Beeper [phone] 11/01/2014,9:11 AM  LOS: 6 days   Pt seen, examined, agree w assess/plan as above with additions as indicated. Agree with ID that the R thigh is indurated and tender indicating probable large area of local tunnel infection.  Presence of a significant tunnel infection makes the infection more unlikely to heal without catheter removal.  However, removing this catheter would mean his next access would have to be translumbar/ transhepatic. These are advanced accesses.  I have discussed this w pt's sister, [name], and explained the different options.  Neither option is without potential complications as he is near end-stage access. For now we have agreed to keep cath in and try a prolonged course of abx (4 weeks).  If he shows signs of worsening cath/ thigh infection then we will have to remove it. [name] Moselle MD pager [phone]    cell [phone] 11/01/2014, 12:49 PM  Additional Objective Labs: Basic Metabolic Panel:  Recent Labs Lab 10/26/14 1759 10/27/14 0650 10/28/14 0730 10/31/14 0650  NA 139  --  140 139  K 3.6  --  4.6 2.9*  CL 99  --  103 101  CO2 27  --  19 25  GLUCOSE 101*  --  159* 93  BUN 12  --  39* 24*  CREATININE 4.36*  --  8.24* 3.42*  CALCIUM 8.5  --  8.2* 8.8  PHOS  --  5.8*  --  2.2*   Liver Function Tests:  Recent Labs Lab 10/31/14 0650  ALBUMIN 2.5*   No results for input(s): LIPASE, AMYLASE in the last 168  hours. CBC:  Recent Labs Lab 10/26/14 1759 10/27/14 0620 10/28/14 0730 10/31/14 0500  WBC 9.4 8.1 6.7 5.8  NEUTROABS 8.1*  --   --   --   HGB 8.9* 8.5* 8.5* 8.2*  HCT 29.0* 27.7* 28.1* 26.8*  MCV 97.3 97.5 96.6 94.0  PLT 163 183 172 274   Blood Culture    Component Value Date/Time   SDES BLOOD HEMODIALYSIS CATHETER 10/27/2014 1110   SPECREQUEST BOTTLES DRAWN AEROBIC AND ANAEROBIC 5CC 10/27/2014 1110   CULT  10/27/2014 1110           BLOOD CULTURE RECEIVED NO GROWTH TO DATE CULTURE WILL BE HELD FOR 5 DAYS BEFORE ISSUING A FINAL NEGATIVE REPORT Performed at [facility]    REPTSTATUS PENDING 10/27/2014 1110    Cardiac Enzymes:  Recent Labs Lab 10/27/14 0650  TROPONINI 0.03   CBG:  Recent Labs Lab 10/28/14 0211  GLUCAP 124*   Iron Studies: No results for input(s): IRON, TIBC, TRANSFERRIN, FERRITIN in the last 72 hours. @lablastinr3 @ Studies/Results: No results found. Medications:   . atorvastatin  20 mg Oral QHS  . cinacalcet  30 mg Oral Q breakfast  . [START ON 11/02/2014] darbepoetin (ARANESP) injection - DIALYSIS  100 mcg Intravenous Q Thu-HD  . doxercalciferol  3 mcg Oral Q T,Th,Sa-HD  . enoxaparin (LOVENOX) injection  60 mg Subcutaneous Q24H  . hydrocortisone sod succinate (SOLU-CORTEF) inj  50 mg Intravenous Q6H  . midodrine  15 mg Oral TID WC  . multivitamin  1 tablet Oral QHS  . mupirocin ointment  1 application Nasal BID  . pantoprazole  40 mg Oral Daily  . phenytoin  250 mg Oral QHS  . potassium chloride  20 mEq Oral BID  . sodium chloride  1,000 mL Intravenous Once  . vancomycin  500 mg Intravenous Q T,Th,Sa-HD

## 2014-11-01 NOTE — Discharge Instructions (Signed)
Thank you for trusting us with your medical care!  You were hospitalized for MRSA sepsis [blood infection] and IVC thrombosis [blood clot] and treated with antibiotics and anticoagulation.   Please take note of the following changes to your medications: -CONTINUE vancomycin IV at the dialysis center -CONTINUE Lovenox 60mg  injection ONCE a day until   To make sure you are getting better, please make it to the follow-up appointments listed on the first page.

## 2014-11-01 NOTE — Care Management Note (Signed)
CARE MANAGEMENT NOTE 11/01/2014  Patient:  [name],[name]   Account Number:  [id number]  Date Initiated:  10/31/2014  Documentation initiated [facility]:  [facility]  Subjective/Objective Assessment:   MRSA bacteremia     Action/Plan:   Pt d/c to home today with caregiver. He will continue to go to HD center and day care center per his sister, Margaretmary Lomb[place]Janie Mills. [facility]   Anticipated DC Date:  11/01/2014   Anticipated DC Plan:  [name]      DC Planning Services  CM consult      Choice offered to / List presented to:             Status of service:   Medicare Important Message given?  YES (If response is "NO", the following Medicare IM given date fields will be blank) Date Medicare IM given:  10/31/2014 Medicare IM given by:  [facility] SHAVIS,ALESIA Date Additional Medicare IM given:   Additional Medicare IM given by:    Discharge Disposition:  [name]  Per UR Regulation:    If discussed at Long Length of Stay Meetings, dates discussed:    Comments:  11/01/2014 this CM spoke with pt sister, Margaretmary LombardJanie Mills, who [place] and [place] the pt home and assist as needed. The pt attends [facility] center where he receives supervision during the day, [facility] [place] [facility] [place] [facility]. Plan is to d/c to home and continue HD in the community with antibiotics to be adm at HD center with treatment.  CRoyal RN MPH, case manager, [phone] Services will continue, no new services have been arranged by CM.  CRoyal RN

## 2014-11-01 NOTE — Progress Notes (Signed)
PT Cancellation Note  Patient Details Name: [name] MRN: [id number] DOB: 1951/03/07   Cancelled Treatment:    Reason Eval/Treat Not Completed: Patient declined, no reason specified  Max effort to motivate pt to participate in PT session. Pt aggressively pushing PT away, not willing to allow assist with transfers or to pull bed linen back. Will continue to follow.    Conni SlipperKirkman, Cherlynn Popiel 11/01/2014, 1:31 PM   Conni SlipperLaura Abhay Godbolt, PT, DPT Acute Rehabilitation Services Pager: [phone]

## 2014-11-01 NOTE — Progress Notes (Addendum)
Subjective: He was sleeping at bedside this AM on exam though awoke upon calling his name. He also waved to me as I left the room.   Per sister Wille Celeste, she spoke with Dr. Arlean Hopping and is agreeable to having him come home today and continue IV antibiotics at dialysis. I explained that he will need Lovenox as well daily until he can follow-up.   Objective: Vital signs in last 24 hours: Filed Vitals:   10/31/14 1100 10/31/14 1700 10/31/14 2100 11/01/14 0500  BP: 97/58  Pulse: 90 102 108 109  Temp: 98.2 F (36.8 C) 98.5 F (36.9 C) 98.2 F (36.8 C) 99.1 F (37.3 C)  TempSrc: Oral Oral Oral Oral  Resp: Height:      Weight:   127 lb 6.8 oz (57.8 kg)   SpO2: 92% 100% 95% 95%   Weight change: -3 lb 15.5 oz (-1.8 kg)  Intake/Output Summary (Last 24 hours) at 11/01/14 0811 Last data filed at 10/31/14 1030  Gross per 24 hour  Intake      0 ml  Output    264 ml  Net   -264 ml   General: resting in bed, NAD, groggy at first HEENT: PERRL, EOMI, no scleral icterus, crusty nares Cardiac: RRR, no rubs, murmurs or gallops Pulm: upper airway sounds present on anterior auscultation Abd: soft, nontender, nondistended, BS present, variceal cords present along edge of upper quadrants Ext: L AV graft scarret, R HD cath covered by bandage without drainage Neuro: moving all extremities freely sometimes to stimuli or questions  Lab Results: Basic Metabolic Panel:  Recent Labs Lab 10/27/14 0650 10/28/14 0730 10/31/14 0650  NA  --  140 139  K  --  4.6 2.9*  CL  --  103 101  CO2  --  19 25  GLUCOSE  --  159* 93  BUN  --  39* 24*  CREATININE  --  8.24* 3.42*  CALCIUM  --  8.2* 8.8  MG 2.0  --   --   PHOS 5.8*  --  2.2*   CBC:  Recent Labs Lab 10/26/14 1759  10/28/14 0730 10/31/14 0500  WBC 9.4  < > 6.7 5.8  NEUTROABS 8.1*  --   --   --   HGB 8.9*  < > 8.5* 8.2*  HCT 29.0*  < > 28.1* 26.8*  MCV 97.3  < > 96.6 94.0  PLT 163  < > 172 274  < > =  values in this interval not displayed. Cardiac Enzymes:  Recent Labs Lab 10/27/14 0650  TROPONINI 0.03   CBG  Recent Labs Lab 10/28/14 0211  GLUCAP 124*   Fasting Lipid Panel:  Recent Labs Lab 10/27/14 0650  CHOL 137  HDL 57  LDLCALC 44  TRIG 180*  CHOLHDL 2.4   Thyroid Function Tests:  Recent Labs Lab 10/27/14 0650  TSH 4.382   Coagulation:  Recent Labs Lab 10/27/14 0650  LABPROT 16.9*  INR 1.36    Micro Results: Recent Results (from the past 240 hour(s))  Blood culture (routine x 2)     Status: None   Collection Time: 10/26/14  5:59 PM  Result Value Ref Range Status   Specimen Description BLOOD ARM LEFT  Final   Special Requests BOTTLES DRAWN AEROBIC AND ANAEROBIC 5CC  Final   Culture   Final    METHICILLIN RESISTANT STAPHYLOCOCCUS AUREUS Note: SUSCEPTIBILITIES PERFORMED ON PREVIOUS CULTURE WITHIN THE LAST 5 DAYS.  Note: Gram Stain Report Called to,Read Back By and Verified With: Caesar BookmanKATHY WIECKER 10/27/14 1120 BY SMITHERSJ Performed at [facility]    Report Status 10/29/2014 FINAL  Final  Blood culture (routine x 2)     Status: None   Collection Time: 10/26/14  6:05 PM  Result Value Ref Range Status   Specimen Description BLOOD HAND RIGHT  Final   Special Requests BOTTLES DRAWN AEROBIC AND ANAEROBIC 3CC  Final   Culture   Final    METHICILLIN RESISTANT STAPHYLOCOCCUS AUREUS Note: RIFAMPIN AND GENTAMICIN SHOULD NOT BE USED AS SINGLE DRUGS FOR TREATMENT OF STAPH INFECTIONS. This organism DOES NOT demonstrate inducible Clindamycin resistance in vitro. CRITICAL RESULT CALLED TO, READ BACK BY AND VERIFIED WITH: JASMINE ADAMS  10/29/14 @ 9:41AM BY RUSCOE A. Note: Gram Stain Report Called to,Read Back By and Verified With: Caesar BookmanKATHY WIECKER 10/27/14 1120 BY SMITHERSJ Performed at [facility]    Report Status 10/29/2014 FINAL  Final   Organism ID, Bacteria METHICILLIN RESISTANT STAPHYLOCOCCUS AUREUS  Final      Susceptibility   Methicillin  resistant staphylococcus aureus - MIC*    CLINDAMYCIN <=0.25 SENSITIVE Sensitive     ERYTHROMYCIN >=8 RESISTANT Resistant     GENTAMICIN <=0.5 SENSITIVE Sensitive     LEVOFLOXACIN <=0.12 SENSITIVE Sensitive     OXACILLIN >=4 RESISTANT Resistant     PENICILLIN >=0.5 RESISTANT Resistant     RIFAMPIN <=0.5 SENSITIVE Sensitive     TRIMETH/SULFA <=10 SENSITIVE Sensitive     VANCOMYCIN 1 SENSITIVE Sensitive     TETRACYCLINE <=1 SENSITIVE Sensitive     * METHICILLIN RESISTANT STAPHYLOCOCCUS AUREUS  MRSA PCR Screening     Status: Abnormal   Collection Time: 10/27/14  3:23 AM  Result Value Ref Range Status   MRSA by PCR POSITIVE (A) NEGATIVE Final    Comment:        The GeneXpert MRSA Assay (FDA approved for NASAL specimens only), is one component of a comprehensive MRSA colonization surveillance program. It is not intended to diagnose MRSA infection nor to guide or monitor treatment for MRSA infections. RESULT CALLED TO, READ BACK BY AND VERIFIED WITH: CALLED TO RN Junius ArgyleJANICE TEMBRINA [id number] @0600  THANEY   Culture, blood (routine x 2)     Status: None (Preliminary result)   Collection Time: 10/27/14 11:10 AM  Result Value Ref Range Status   Specimen Description BLOOD HEMODIALYSIS CATHETER  Final   Special Requests BOTTLES DRAWN AEROBIC AND ANAEROBIC 5CC  Final   Culture   Final           BLOOD CULTURE RECEIVED NO GROWTH TO DATE CULTURE WILL BE HELD FOR 5 DAYS BEFORE ISSUING A FINAL NEGATIVE REPORT Performed at [facility]    Report Status PENDING  Incomplete   Studies/Results: No results found. Medications: I have reviewed the patient's current medications. Scheduled Meds: . atorvastatin  20 mg Oral QHS  . cinacalcet  30 mg Oral Q breakfast  . [START ON 11/02/2014] darbepoetin (ARANESP) injection - DIALYSIS  100 mcg Intravenous Q Thu-HD  . doxercalciferol  3 mcg Oral Q T,Th,Sa-HD  . enoxaparin (LOVENOX) injection  60 mg Subcutaneous Q24H  . hydrocortisone sod succinate  (SOLU-CORTEF) inj  50 mg Intravenous Q6H  . midodrine  15 mg Oral TID WC  . multivitamin  1 tablet Oral QHS  . mupirocin ointment  1 application Nasal BID  . pantoprazole  40 mg Oral Daily  . phenytoin  250 mg Oral QHS  .  potassium chloride  20 mEq Oral BID  . sodium chloride  1,000 mL Intravenous Once  . vancomycin  500 mg Intravenous Q T,Th,Sa-HD   Continuous Infusions:  PRN Meds:.acetaminophen **OR** acetaminophen Assessment/Plan:  MRSA bacteremia: Likely source R femoral cath though cultures with NGTD x 5 days from blood drawn there. ID suspects there might be deeper & greater local involvement which warrants longer course of antibiotics though feels he has a poor prognosis overall. TTE 1/9 without valvular vegetations.  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  -Continue Lovenox per Pharmacy, currently  daily -Bridge to coumadin at follow-up appointment next week   ESRD on HD: T/H/Sa schedule. HD access is an issue as L femoral vein is occluded, R site presumably source of infection though may be used for continued dialysis. Confirmed with Vascular again today that he could not have that catheter replaced if that site is infected and would need translumbar or transhepatic catheter placement if that is line with what the patient wants. -Continue home Sensipar, Renvela, Dialyvite -Nephrology & Vascular following, appreciate recs.   Chronic hypotension: Systolic BP trending mostly 80s as he has not been on hydrocortisone  off IV access. -Continue home midodrine  TID  Seizure disorder: EEG 10/27/2013 unremarkable. -Continue phenytoin  Anemia of chronic disease, with ESRD: Stable at 8.2 from 1/9, baseline 8-9.  -Will need to reassess at dialysis  GERD: Continue home Protonix  Hyperlipidemia: Continue home lipitor   FEN:  -Diet: Dysphagia I  DVT  prophylaxis: Lovenox  CODE STATUS: PARTIAL -Defer to sister Sherryle Lis if patients lacks decision-making capacity -Confirmed on 10/28/14 with her that patient would want all resuscitative measures [intubation, pressors, antiarrhythmics] EXCEPT CPR -Needs to be revisited with family again  Dispo: Stable for discharge today. -Anticipate he is at risk for readmission given that he will not clear his infection and suspect the family does not understand his true prognosis   LOS: 6 days   Heywood Iles, MD 11/01/2014, 8:11 AM

## 2014-11-01 NOTE — Progress Notes (Signed)
[facility]    Subjective: Pt would not verbalize anything to me   Antibiotics:  Anti-infectives    Start     Dose/Rate Route Frequency Ordered Stop   11/01/14 0000  vancomycin 500 mg in sodium chloride 0.9 % 100 mL     500 mg100 mL/hr over 60 Minutes Intravenous Every T-Th-Sa (Hemodialysis) 11/01/14 1538     10/28/14 1200  vancomycin (VANCOCIN) 500 mg in sodium chloride 0.9 % 100 mL IVPB  Status:  Discontinued     500 mg100 mL/hr over 60 Minutes Intravenous Every T-Th-Sa (Hemodialysis) 10/27/14 0625 11/01/14 1958   10/28/14 1200  cefTRIAXone (ROCEPHIN) 1 g in dextrose 5 % 50 mL IVPB - Premix  Status:  Discontinued     1 g100 mL/hr over 30 Minutes Intravenous Every 24 hours 10/28/14 1140 10/29/14 1109   10/27/14 0630  vancomycin (VANCOCIN) 1,250 mg in sodium chloride 0.9 % 250 mL IVPB     1,250 mg166.7 mL/hr over 90 Minutes Intravenous  Once 10/27/14 0625 10/27/14 0944   10/27/14 0630  piperacillin-tazobactam (ZOSYN) IVPB 2.25 g  Status:  Discontinued     2.25 g100 mL/hr over 30 Minutes Intravenous 3 times per day 10/27/14 0625 10/28/14 1140      Medications: Scheduled Meds:  Continuous Infusions: PRN Meds:.    Objective: Weight change: -3 lb 15.5 oz (-1.8 kg)  Intake/Output Summary (Last 24 hours) at 11/01/14 2200 Last data filed at 11/01/14 0900  Gross per 24 hour  Intake      0 ml  Output      0 ml  Net      0 ml   Blood pressure 80/51, pulse 100, temperature 98 F (36.7 C), temperature source Oral, resp. rate 18, height 5' (1.524 m), weight 127 lb 6.8 oz (57.8 kg), SpO2 95 %. Temp:  [98 F (36.7 C)-99.1 F (37.3 C)] 98 F (36.7 C) (01/13 1000) Pulse Rate:  [100-109] 100 (01/13 1000) Resp:  [16-18] 18 (01/13 1000) BP: (80-97)/(51-58) 80/51 mmHg (01/13 1000) SpO2:  [95 %] 95 % (01/13 1000)  Physical Exam: General: Alert and awake gazing leftwards, nonverbal HEENT:  EOMI CVS regular rate, normal r,  no murmur rubs or  gallops Chest: clear to auscultation bilaterally, no wheezing, rales or rhonchi Abdomen: softnondistended, normal bowel sounds, Extremities: no  clubbing or edema noted bilaterally Skin:   He has induration around his right femoral dialysis catheter see picture below 10/30/2014:         Picture of thigh graft where there is thrombosis 10/30/2014     Neuro: nonfocal  CBC:  CBC Latest Ref Rng 10/31/2014 10/28/2014 10/27/2014  WBC 4.0 - 10.5 K/uL 5.8 6.7 8.1  Hemoglobin 13.0 - 17.0 g/dL 8.2(L) 8.5(L) 8.5(L)  Hematocrit 39.0 - 52.0 % 26.8(L) 28.1(L) 27.7(L)  Platelets 150 - 400 K/uL 274 172 183      BMET  Recent Labs  10/31/14 0650  NA 139  K 2.9*  CL 101  CO2 25  GLUCOSE 93  BUN 24*  CREATININE 3.42*  CALCIUM 8.8     Liver Panel   Recent Labs  10/31/14 0650  ALBUMIN 2.5*       Sedimentation Rate No results for input(s): ESRSEDRATE in the last 72 hours. C-Reactive Protein No results for input(s): CRP in the last 72 hours.  Micro Results: Recent Results (from the past 720 hour(s))  Blood culture (routine x 2)     Status: None  Collection Time: 10/26/14  5:59 PM  Result Value Ref Range Status   Specimen Description BLOOD ARM LEFT  Final   Special Requests BOTTLES DRAWN AEROBIC AND ANAEROBIC 5CC  Final   Culture   Final    METHICILLIN RESISTANT STAPHYLOCOCCUS AUREUS Note: SUSCEPTIBILITIES PERFORMED ON PREVIOUS CULTURE WITHIN THE LAST 5 DAYS. Note: Gram Stain Report Called to,Read Back By and Verified With: Caesar Bookman 10/27/14 1120 BY SMITHERSJ Performed at [facility]    Report Status 10/29/2014 FINAL  Final  Blood culture (routine x 2)     Status: None   Collection Time: 10/26/14  6:05 PM  Result Value Ref Range Status   Specimen Description BLOOD HAND RIGHT  Final   Special Requests BOTTLES DRAWN AEROBIC AND ANAEROBIC 3CC  Final   Culture   Final    METHICILLIN RESISTANT STAPHYLOCOCCUS AUREUS Note: RIFAMPIN AND GENTAMICIN SHOULD  NOT BE USED AS SINGLE DRUGS FOR TREATMENT OF STAPH INFECTIONS. This organism DOES NOT demonstrate inducible Clindamycin resistance in vitro. CRITICAL RESULT CALLED TO, READ BACK BY AND VERIFIED WITH: JASMINE ADAMS  10/29/14 @ 9:41AM BY RUSCOE A. Note: Gram Stain Report Called to,Read Back By and Verified With: Caesar Bookman 10/27/14 1120 BY SMITHERSJ Performed at [facility]    Report Status 10/29/2014 FINAL  Final   Organism ID, Bacteria METHICILLIN RESISTANT STAPHYLOCOCCUS AUREUS  Final      Susceptibility   Methicillin resistant staphylococcus aureus - MIC*    CLINDAMYCIN <=0.25 SENSITIVE Sensitive     ERYTHROMYCIN >=8 RESISTANT Resistant     GENTAMICIN <=0.5 SENSITIVE Sensitive     LEVOFLOXACIN <=0.12 SENSITIVE Sensitive     OXACILLIN >=4 RESISTANT Resistant     PENICILLIN >=0.5 RESISTANT Resistant     RIFAMPIN <=0.5 SENSITIVE Sensitive     TRIMETH/SULFA <=10 SENSITIVE Sensitive     VANCOMYCIN 1 SENSITIVE Sensitive     TETRACYCLINE <=1 SENSITIVE Sensitive     * METHICILLIN RESISTANT STAPHYLOCOCCUS AUREUS  MRSA PCR Screening     Status: Abnormal   Collection Time: 10/27/14  3:23 AM  Result Value Ref Range Status   MRSA by PCR POSITIVE (A) NEGATIVE Final    Comment:        The GeneXpert MRSA Assay (FDA approved for NASAL specimens only), is one component of a comprehensive MRSA colonization surveillance program. It is not intended to diagnose MRSA infection nor to guide or monitor treatment for MRSA infections. RESULT CALLED TO, READ BACK BY AND VERIFIED WITH: CALLED TO RN Junius Argyle [id number]  THANEY   Culture, blood (routine x 2)     Status: None (Preliminary result)   Collection Time: 10/27/14 11:10 AM  Result Value Ref Range Status   Specimen Description BLOOD HEMODIALYSIS CATHETER  Final   Special Requests BOTTLES DRAWN AEROBIC AND ANAEROBIC 5CC  Final   Culture   Final           BLOOD CULTURE RECEIVED NO GROWTH TO DATE CULTURE WILL BE HELD FOR 5 DAYS  BEFORE ISSUING A FINAL NEGATIVE REPORT Performed at [facility]    Report Status PENDING  Incomplete  Culture, blood (routine x 2)     Status: None (Preliminary result)   Collection Time: 10/31/14  9:30 AM  Result Value Ref Range Status   Specimen Description BLOOD RIGHT HEMODIALYSIS CATHETER  Final   Special Requests   Final    BOTTLES DRAWN AEROBIC AND ANAEROBIC 10CC  FEMORAL CATHETER   Culture   Final  BLOOD CULTURE RECEIVED NO GROWTH TO DATE CULTURE WILL BE HELD FOR 5 DAYS BEFORE ISSUING A FINAL NEGATIVE REPORT Performed at [facility]    Report Status PENDING  Incomplete  Culture, blood (routine x 2)     Status: None (Preliminary result)   Collection Time: 10/31/14  9:40 AM  Result Value Ref Range Status   Specimen Description BLOOD RIGHT HEMODIALYSIS CATHETER  Final   Special Requests   Final    BOTTLES DRAWN AEROBIC AND ANAEROBIC 10CC  FEMORAL CATHETER   Culture   Final           BLOOD CULTURE RECEIVED NO GROWTH TO DATE CULTURE WILL BE HELD FOR 5 DAYS BEFORE ISSUING A FINAL NEGATIVE REPORT Performed at [facility]    Report Status PENDING  Incomplete    Studies/Results: No results found.    Assessment/Plan:  Principal Problem:   MRSA bacteremia Active Problems:   Hyperlipidemia   Chronic anemia   Mental retardation   Chronic hypotension   ESRD (end stage renal disease)   Secondary renal hyperparathyroidism   Seizure disorder   Acute encephalopathy   GERD (gastroesophageal reflux disease)   History of adrenal insufficiency   VTE (venous thromboembolism)   Sepsis due to Staphylococcus aureus   Chronic diastolic CHF (congestive heart failure)   Atrial thrombus    [name] is a 63 y.o. male with  Mental retardation, ESRD on HD, difficulty with HD access sites, thrombosed AV graft on left, and complicated fluid collection near thrombosed vessels on the right. His Fem cath entry site is markedly indurated and I suspect  this entire area is overtly infected in addition to the fact that the catheter is by definition infected with MRSA. Family wished to not pursue aggressive other options for access but try to keep catheter and rx with IV abx       [place] [facility] Staphylococcus aureus bacteremia   Staphylococcus aureus bacteremia (SAB) is associated with a high rate of complications and mortality.  Specific aspects of clinical management are critical to optimizing the outcome of patients with SAB.  Therefore, the [facility] Antimicrobial Management Team [facility]) has initiated an intervention aimed at improving the management of SAB at [facility].  To do so, Infectious Diseases physicians are providing an evidence-based consult for the management of all patients with SAB.     Yes No Comments  Perform follow-up blood cultures (even if the patient is afebrile) to ensure clearance of bacteremia [x]  []   10/27/2014 cultures are been obtained NGTD 10/31/13 also NGTD  Remove vascular catheter and obtain follow-up blood cultures after the removal of the catheter []  []   this is one of them largest issues for this patient. Apparently he has no more access sites left but his Vascular  catheter to me appears  overtly infected given amount of induration around site and live collections seen on CT scan    "Treating through the catheter" and principal never works for Staphylococcus aureus bacteremia  but certainly is not going to work here but I understand desire to continue HD.    Perform echocardiography to evaluate for endocarditis (transthoracic ECHO is 40-50% sensitive, TEE is > 90% sensitive) [x]  []  Please keep in mind, that neither test can definitively EXCLUDE endocarditis, and that should clinical suspicion remain high for endocarditis the patient should then still be treated with an "endocarditis" duration of therapy = 6 weeks   He has a large thrombus which  certainly could be superinfected     Consult electrophysiologist to evaluate implanted cardiac device (pacemaker, ICD)    not applicable   Ensure source control   Have all abscesses been drained effectively? Have deep seeded infections (septic joints or osteomyelitis) had appropriate surgical debridement?   No source of his infection is undoubtedly has catheter and potentially soft tissue around it and even deeper tissues.   Investigate for "metastatic" sites of infection   Does the patient have ANY symptom or physical exam finding that would suggest a deeper infection (back or neck pain that may be suggestive of vertebral osteomyelitis or epidural abscess, muscle pain that could be a symptom of pyomyositis)?  Keep in mind that for deep seeded infections MRI imaging with contrast is preferred rather than other often insensitive tests such as plain x-rays, especially early in a patient's presentation.   Given his inability to speak much and is impaired cognitive state I am having a great of difficulty writing to ascertain if he has any distant sites of metastatic infection   Change antibiotic therapy to vancomycin    Beta-lactam antibiotics are preferred for MSSA due to higher cure rates.   If on Vancomycin, goal trough should be 15 - 20 mcg/mL  Estimated duration of IV antibiotic therapy:    Continue IV vancomycin for 8 weeks  I would then switch to  Oral doxy bid vs renally dosed TMP/SMX plus bid rifampin indefinitely   Consult case management for probably prolonged outpatient IV antibiotic therapy       LOS: 6 days   Acey Lav 11/01/2014, 10:00 PM

## 2014-11-02 ENCOUNTER — Telehealth: Payer: Self-pay | Admitting: *Deleted

## 2014-11-02 NOTE — Telephone Encounter (Signed)
Call from Tse BonitoSandra at [facility] lab reporting a Positive Blood Culture - # [id number] Collection date 1/8 #3 Gram positive Cocci in clusters.    She will release into chart now. Dr Allena KatzPatel notified

## 2014-11-03 LAB — CULTURE, BLOOD (ROUTINE X 2)

## 2014-11-06 LAB — CULTURE, BLOOD (ROUTINE X 2)
Culture: NO GROWTH
Culture: NO GROWTH

## 2014-11-06 NOTE — Telephone Encounter (Signed)
We discussed this in our family meeting the day prior to discharge - that he will unlikely clear the infection until his dialysis catheter is taken out which would mean that he discontinue dialysis.   I will make sure this news is conveyed to the family at their follow-up appointment.

## 2014-11-09 ENCOUNTER — Ambulatory Visit: Payer: Medicare Other | Admitting: Internal Medicine

## 2014-11-10 ENCOUNTER — Ambulatory Visit: Payer: Medicare Other | Admitting: Internal Medicine

## 2014-11-20 ENCOUNTER — Ambulatory Visit (INDEPENDENT_AMBULATORY_CARE_PROVIDER_SITE_OTHER): Payer: Medicare Other | Admitting: Internal Medicine

## 2014-11-20 ENCOUNTER — Ambulatory Visit (INDEPENDENT_AMBULATORY_CARE_PROVIDER_SITE_OTHER): Payer: Medicare Other | Admitting: Pharmacist

## 2014-11-20 ENCOUNTER — Encounter: Payer: Self-pay | Admitting: Internal Medicine

## 2014-11-20 VITALS — BP 92/56 | HR 94 | Temp 97.4°F | Resp 20 | Ht <= 58 in | Wt 125.0 lb

## 2014-11-20 DIAGNOSIS — B9562 Methicillin resistant Staphylococcus aureus infection as the cause of diseases classified elsewhere: Secondary | ICD-10-CM

## 2014-11-20 DIAGNOSIS — J309 Allergic rhinitis, unspecified: Secondary | ICD-10-CM

## 2014-11-20 DIAGNOSIS — I8290 Acute embolism and thrombosis of unspecified vein: Secondary | ICD-10-CM

## 2014-11-20 DIAGNOSIS — R7881 Bacteremia: Secondary | ICD-10-CM

## 2014-11-20 LAB — POCT INR: INR: 1.3

## 2014-11-20 MED ORDER — ENOXAPARIN SODIUM 60 MG/0.6ML ~~LOC~~ SOLN: 60.0000 mg | SUBCUTANEOUS | Status: DC

## 2014-11-20 MED ORDER — LORATADINE 5 MG PO CHEW: 5.0000 mg | CHEWABLE_TABLET | ORAL | Status: AC

## 2014-11-20 NOTE — Assessment & Plan Note (Addendum)
Was to be bridged with coumadin s/p hospital discharge, however missed appointment.  Currently finished last injection of lovenox yesterday 1/31.  Today, INR checked in opc with pharmacy 1.3.  Care takers express hesitation of continuing with anticoagulation based on family discussions they thought were had.  I tried to call sister, who is decision maker, to clarify, but no answer and left voicemail to call back.  In the meantime, we will continue lovenox injections daily (7 more prescribed today) and hope to get information from sister whether to proceed with further anticoagulation and start coumadin per pharmacy recommendations. I will also forward note to PCP who may wish to have discussion of further goals of care with family  Addendum 11/22/14: I was able to get a hold of [name], [name]' sister yesterday afternoon on the phone and discuss anticoagulation.  She says she was not aware of the transition to coumadin but understands that he has a clot and that he needs something more than injections.  I reviewed the events of most recent hospitalization again with her and risks of anticoagulation including recurrent bleeding and possible death from bleeding.  We also reviewed need for frequent INR monitoring. She is in agreement to proceed with further anticoagulation but also wants to hear from his nephrologist to make sure they agree and will be checking with INR. I also explained that this may result in more frequent doctor visits/appointments.  I have requested her to return with [name] to opc if possible before 2/8 to sit down with provider and confirm going forward with anticoagulation. She said her schedule is very busy but that she can let us know. In the meantime, per her request, I spoke with Dr. Arrie Aranoladonato yesterday on the phone as well who follows [name] for HD.  He was informed of the need for anticoagulation and family wishing to proceed unless there is a specific renal objection.  Dr.  Arrie Aranoladonato will speak with the family on his next HD session next week on Tuesday 2/9 and discuss the case further.  In the meantime, we will continue with lovenox injections, I have 7 on last office visit, will prescribe another 7, until agreement can be reached on coumadin initiation.  INR checks will take place with HD sessions and monitored by nephrology for the most part.  I will call and update [name] again today and have prescribed 7 more lovenox injections for total 14 days since last office visit. I have also updated the PCP of the plan along with the team who was taking care of [name] on the inpatient service who had some goals of care discussions with the family during admission. Lovenox dose was confirmed by pharmacy in office on 2/1 but will route note to her as well.   -I have spoken with [name] again on the phone today and she is up to date with the plan and plans to touch base with Dr. Arrie Aranoladonato next Tuesday for final decision on coumadin. In the meantime, I have informed her that I have prescribed another 7 injections of lovenox just in case.

## 2014-11-20 NOTE — Assessment & Plan Note (Signed)
Low dose claritin, 5mg  every other day prn for now to see if that helps at all with rhinorrhea.

## 2014-11-20 NOTE — Patient Instructions (Signed)
Caregiver educated about medication dosing as defined in this encounter and verbalized understanding by repeating back instructions provided.

## 2014-11-20 NOTE — Patient Instructions (Signed)
We can try claritin 5mg  (1 tablet) every 48 hours to help with allergy symptoms  Please call office to discuss continuing anticoagulation  Please continue to follow up with nephrology and HD  Loratadine tablets What is this medicine? LORATADINE (lor AT a deen) is an antihistamine. It helps to relieve sneezing, runny nose, and itchy, watery eyes. This medicine is used to treat the symptoms of allergies. It is also used to treat itchy skin rash and hives. This medicine may be used for other purposes; ask your health care provider or pharmacist if you have questions. COMMON BRAND NAME(S): Alavert, Allergy Relief, Claritin, Claritin Hives Relief, Clear-Atadine, QlearQuil All Day & All Night Allergy Relief, Tavist ND What should I tell my health care provider before I take this medicine? They need to know if you have any of these conditions: -asthma -kidney disease -liver disease -an unusual or allergic reaction to loratadine, other antihistamines, other medicines, foods, dyes, or preservatives -pregnant or trying to get pregnant -breast-feeding How should I use this medicine? Take this medicine by mouth with a glass of water. Follow the directions on the label. You may take this medicine with food or on an empty stomach. Take your medicine at regular intervals. Do not take your medicine more often than directed. Talk to your pediatrician regarding the use of this medicine in children. While this medicine may be used in children as young as 6 years for selected conditions, precautions do apply. Overdosage: If you think you have taken too much of this medicine contact a poison control center or emergency room at once. NOTE: This medicine is only for you. Do not share this medicine with others. What if I miss a dose? If you miss a dose, take it as soon as you can. If it is almost time for your next dose, take only that dose. Do not take double or extra doses. What may interact with this  medicine? -other medicines for colds or allergies This list may not describe all possible interactions. Give your health care provider a list of all the medicines, herbs, non-prescription drugs, or dietary supplements you use. Also tell them if you smoke, drink alcohol, or use illegal drugs. Some items may interact with your medicine. What should I watch for while using this medicine? Tell your doctor or healthcare professional if your symptoms do not start to get better or if they get worse. Your mouth may get dry. Chewing sugarless gum or sucking hard candy, and drinking plenty of water may help. Contact your doctor if the problem does not go away or is severe. You may get drowsy or dizzy. Do not drive, use machinery, or do anything that needs mental alertness until you know how this medicine affects you. Do not stand or sit up quickly, especially if you are an older patient. This reduces the risk of dizzy or fainting spells. What side effects may I notice from receiving this medicine? Side effects that you should report to your doctor or health care professional as soon as possible: -allergic reactions like skin rash, itching or hives, swelling of the face, lips, or tongue -breathing problems -unusually restless or nervous Side effects that usually do not require medical attention (report to your doctor or health care professional if they continue or are bothersome): -drowsiness -dry or irritated mouth or throat -headache This list may not describe all possible side effects. Call your doctor for medical advice about side effects. You may report side effects to FDA at 1-800-FDA-1088.  Where should I keep my medicine? Keep out of the reach of children. Store at room temperature between 2 and 30 degrees C (36 and 86 degrees F). Protect from moisture. Throw away any unused medicine after the expiration date. NOTE: This sheet is a summary. It may not cover all possible information. If you have  questions about this medicine, talk to your doctor, pharmacist, or health care provider.  2015, Elsevier/Gold Standard. (2008-04-10 17:17:24)

## 2014-11-20 NOTE — Progress Notes (Signed)
CLINICAL PHARMACY ANTICOAGULATION MANAGEMENT [name] is a 64 y.o. male who reports to the clinic for monitoring of warfarin treatment.  Patient is a new-start for VTE and will require overlap (bridge) therapy with a parenteral anticoagulant until INR is therapeutic.  Anticoagulation Clinic Visit History: Anticoagulation Episode Summary    Current INR goal    Next INR check 12/19/2014   INR from last check 1.3 (11/20/2014)   Weekly max dose    Target end date    INR check location    Preferred lab    Send INR reminders to       Comments        ASSESSMENT Recent Results: Recent results are below:  Lab Results  Component Value Date   INR 1.3 11/20/2014   INR 1.36 10/27/2014   INR 1.08 04/29/2014   Anticoagulation Dosing: INR as of 11/20/2014 and Previous Dosing Information    INR Dt [facility] Goal [facility] Tot Sun Mon Tue Wed Thu Fri Sat   11/20/2014 1.3 -            Anticoagulation Dose Instructions as of 11/20/2014      Total Sun Mon Tue Wed Thu Fri Sat   New Dose 17.5 mg 2.5 mg 2.5 mg 2.5 mg 2.5 mg 2.5 mg 2.5 mg 2.5 mg     (2.5 mg x 1)  (2.5 mg x 1)  (2.5 mg x 1)  (2.5 mg x 1)  (2.5 mg x 1)  (2.5 mg x 1)  (2.5 mg x 1)                         Description        New start      PLAN [facility] [phone]) for HD on Tuesdays, Thursdays, and Saturdays with whom we can collaborate for INR monitoring. This will obviate the need for frequent visits to [facility]. Considering history of GI bleed, patient was started on 2.5 mg daily. Enoxaparin was continued for overlap therapy for at least 5 days with the goal of d/c once INR remains therapeutic.  Patient Instructions: Patient Instructions  Caregiver educated about medication dosing as defined in this encounter and verbalized understanding by repeating back instructions provided.    Follow-up (every 1-3 days via collaboration with HD clinic) Return in about 1 month (around 12/19/2014) for  Follow-up INR on same day as follow-up PCP visit.  Marzetta BoardJennifer Rafael Quesada, PharmD BCPS, BCACP

## 2014-11-20 NOTE — Progress Notes (Addendum)
Subjective:   Patient ID: [name] male   DOB: 01/03/1951 64 y.o.   MRN: [id number]  HPI: [name] is a 64 y.o. 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 Source of bacteremia appears to be HD catheter with very limited if any options for further access.  Currently he is on IV Vancomycin for total 8 weeks then is to be transitioned to doxycycline bid vs. Bactrim renal dosing + rifampin indefinitely.   No family with him today, just two caretakers.   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 Today, caregivers are here and expressed some doubt about if family wanted to continue with anti-coagulation, therefore, we will need to try to confirm this before starting coumadin. I called the sister but cell phone mail box is full and cannot leave a message. She is not in at work today. Voicemail left at home.   MRSA bacteremia--HD cath remains in place. On IV vancomycin with HD  Care givers report a slight cold with runny nose and clear drainage that has been going on for quite sometime. He does not like to take many medications and certainly not liquid. They deny any fever, chills, or SOB.   Past Medical History  Diagnosis Date  . Heart murmur, systolic 08/10/2009  . Syncope 05/07/2009  . 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 Esophageal varices 11/07/2008  . Gastric ulcer 10/09    antral, with h pylori positive  . Congestive heart failure 03/06/2008  . Cellulitis and abscess of leg, except foot 03/06/2008  . Secondary hyperparathyroidism 02/02/2008  . Mute 02/02/2008  . Hyperlipidemia 02/02/2008  . Anemia 02/02/2008  . ESRD (end stage renal disease)     TTS hemodialysis  . GERD (gastroesophageal reflux disease)   . Hypertension 02/02/2008    in history  . Mental retardation  02/02/2008  . Mute   . Seizures     "non in a while at home". none in past year .  Marland Kitchen. Complication of anesthesia     in the past BP has dropped   . Sepsis due to Pseudomonas species 05/05/2014   Current Outpatient Prescriptions  Medication Sig Dispense Refill  . atorvastatin (LIPITOR) 20 MG tablet Take 1 tablet (20 mg total) by mouth at bedtime. 90 tablet 2  . cinacalcet (SENSIPAR) 30 MG tablet Take 1 tablet (30 mg total) by mouth daily with breakfast. 60 tablet 6  . clonazePAM (KLONOPIN) 0.5 MG tablet Take 0.5 tablets (0.25 mg total) by mouth 2 (two) times daily. 30 tablet 5  . enoxaparin (LOVENOX) 60 MG/0.6ML injection Inject 0.6 mLs (60 mg total) into the skin daily. 14 Syringe 0  . folic acid-vitamin b complex-vitamin c-selenium-zinc (DIALYVITE) 3 MG TABS tablet Take 1 tablet by mouth daily.    . midodrine (PROAMATINE) 10 MG tablet Take 1.5 tablets (15 mg total) by mouth 3 (three) times daily. 135 tablet 11  . pantoprazole (PROTONIX) 40 MG tablet Take 1 tablet (40 mg total) by mouth daily. 30 tablet 11  . phenytoin (DILANTIN) 100 MG ER capsule Take 200 mg by mouth at bedtime.    . sevelamer carbonate (RENVELA) 800 MG tablet Take 800 mg by mouth 3 (three) times daily with meals.    . vancomycin 500 mg in sodium chloride 0.9 % 100 mL Inject 500  mg into the vein Every Tuesday,Thursday,and Saturday with dialysis.     No current facility-administered medications for this visit.   No family history on file. History   Social History  . Marital Status: Single    Spouse Name: N/A    Number of Children: 0  . Years of Education: N/A   Occupational History  .     Social History Main Topics  . Smoking status: Never Smoker   . Smokeless tobacco: Never Used  . Alcohol Use: No  . Drug Use: No  . Sexual Activity: No   Other Topics Concern  . Not on file   Social History Narrative   Patient is living with care providers.    Patient is right handed.    Patient does not have any  children.    Patient is on disability          Review of Systems: unable to obtain from patient as he is non-verbal but caregivers deny any recent falls, fever or chills. +rhinorrhea and occasional cough.    Objective:  Physical Exam: Filed Vitals:   11/20/14 1014  BP: 92/56  Pulse: 94  Temp: 97.4 F (36.3 C)  TempSrc: Oral  Resp: 20  Height:  (1.448 m)  Weight: 125 lb (56.7 kg)  SpO2: 100%   Vitals reviewed. General: sitting in wheelchair, NAD HEENT: +conjunctival injection b/l, moving eyes Cardiac: RRR Pulm: clear to auscultation bilaterally but shallow respirations Abd: soft, BS present Ext: moving extremities, can stand with support. R HD cath in place with dressing that is dry and intact. Darkened area around site with hardened induration. Unclear if tender to palpation. Warm to touch. LLE excoriation/dried up scab that is peeling.  Neuro: non-verbal but follows some commands with coaching. Able to stand with assist.   Assessment & Plan:  Discussed with Dr. Meredith Pel

## 2014-11-20 NOTE — Assessment & Plan Note (Signed)
Likely source R HD cath that remains in place as only site available for continued HD.  On IV vancomycin for total 8 weeks then transition to po doxy vs. Bactrim. He will need to continue to follow up with renal and ID. Care givers deny any fevers at this time. Site looks clean with dressing in tact, but darkened, warm, and indurated.   Per discharge summary of recent hospitalization, goals of care discussion need to be continued with family--i will forward note to pcp

## 2014-11-21 MED ORDER — WARFARIN SODIUM 2.5 MG PO TABS: ORAL_TABLET | ORAL | Status: DC

## 2014-11-21 NOTE — Progress Notes (Signed)
Internal Medicine Clinic Attending Date of Visit: 11/20/2014  Case discussed with Dr. Qureshi soon after the resident saw the patient.  We reviewed the resident's history and exam and pertinent patient test results.  I agree with the assessment, diagnosis, and plan of care documented in the resident's note. 

## 2014-11-22 MED ORDER — ENOXAPARIN SODIUM 60 MG/0.6ML ~~LOC~~ SOLN: 60.0000 mg | SUBCUTANEOUS | Status: DC

## 2014-11-22 NOTE — Addendum Note (Signed)
Addended by: Baltazar ApoQURESHI, Zayvian Mcmurtry J on: 11/22/2014 08:47 AM   Modules accepted: Orders

## 2014-11-24 ENCOUNTER — Telehealth: Payer: Self-pay | Admitting: Internal Medicine

## 2014-11-24 NOTE — Telephone Encounter (Signed)
Call to patient to confirm appointment for 11/27/14 at 9:15. LMTCB ° °

## 2014-11-25 LAB — PROTIME-INR

## 2014-11-27 ENCOUNTER — Ambulatory Visit: Payer: Medicare Other | Admitting: Pharmacist

## 2014-11-27 ENCOUNTER — Other Ambulatory Visit (INDEPENDENT_AMBULATORY_CARE_PROVIDER_SITE_OTHER): Payer: Medicare Other | Admitting: Pharmacist

## 2014-11-27 ENCOUNTER — Encounter: Payer: Self-pay | Admitting: Pharmacist

## 2014-11-27 ENCOUNTER — Encounter: Payer: Self-pay | Admitting: Internal Medicine

## 2014-11-27 ENCOUNTER — Ambulatory Visit (INDEPENDENT_AMBULATORY_CARE_PROVIDER_SITE_OTHER): Payer: Medicare Other | Admitting: Internal Medicine

## 2014-11-27 VITALS — BP 100/63 | HR 106 | Temp 98.2°F | Wt 125.4 lb

## 2014-11-27 DIAGNOSIS — Z7901 Long term (current) use of anticoagulants: Secondary | ICD-10-CM

## 2014-11-27 DIAGNOSIS — I8290 Acute embolism and thrombosis of unspecified vein: Secondary | ICD-10-CM

## 2014-11-27 LAB — POCT INR: INR: 1.2

## 2014-11-27 NOTE — Patient Instructions (Addendum)
General Instructions:  Ms. Arvilla Market needs to meet or discuss with kidney doctor tomorrow 11/28/14 on anticoagulation as previously discussed and schedule don the phone.  IF the plan is to start coumadin, his INR will need to be monitored closely and coumadin dosing will need to take place. The plan for now is INR checks and dosing to be monitored with HD and kidney doctors, but if anything changes please let us know.   For now, continue lovenox injections daily and DO NOT start Coumadin until agreed on by family and discussion with nephrology takes place per Ms. Arvilla Market request.   We will check INR today just in case dosing will need to take place tomorrow. Hb will also need to be monitored at HD.   Please review the information in regards to warfarin as listed below, including increased risk of bleeding. Since he is on anticoagulation, if he has any bleeding please notify PCP Dr. Yetta Barre [phone] right away and you may need to go to the emergency room  Please follow up with Dr. Yetta Barre on next available and within one month if possible  Please bring your medicines with you each time you come to clinic.  Medicines may include prescription medications, over-the-counter medications, herbal remedies, eye drops, vitamins, or other pills.  Progress Toward Treatment Goals:  Treatment Goal 11/27/2014  Prevent falls at goal    Self Care Goals & Plans:  No flowsheet data found.  No flowsheet data found.   Care Management & Community Referrals:  Referral 03/30/2013  Referrals made to community resources none     Warfarin: What You Need to Know Warfarin is an anticoagulant. Anticoagulants help prevent the formation of blood clots. They also help stop the growth of blood clots. Warfarin is sometimes referred to as a "blood thinner."  Normally, when body tissues are cut or damaged, the blood clots in order to prevent blood loss. Sometimes clots form inside your blood vessels and obstruct the flow of blood  through your circulatory system (thrombosis). These clots may travel through your bloodstream and become lodged in smaller blood vessels in your brain, which can cause a stroke, or in your lungs (pulmonary embolism). WHO SHOULD USE WARFARIN? Warfarin is prescribed for people at risk of developing harmful blood clots:  People with surgically implanted mechanical heart valves, irregular heart rhythms called atrial fibrillation, and certain clotting disorders.  People who have developed harmful blood clotting in the past, including those who have had a stroke or a pulmonary embolism, or thrombosis in their legs (deep vein thrombosis [DVT]).  People with an existing blood clot, such as a pulmonary embolism. WARFARIN DOSING Warfarin tablets come in different strengths. Each tablet strength is a different color, with the amount of warfarin (in milligrams) clearly printed on the tablet. If the color of your tablet is different than usual when you receive a new prescription, report it immediately to your pharmacist or health care provider. WARFARIN MONITORING The goal of warfarin therapy is to lessen the clotting tendency of blood but not prevent clotting completely. Your health care provider will monitor the anticoagulation effect of warfarin closely and adjust your dose as needed. For your safety, blood tests called prothrombin time (PT) or international normalized ratio (INR) are used to measure the effects of warfarin. Both of these tests can be done with a finger stick or a blood draw. The longer it takes the blood to clot, the higher the PT or INR. Your health care provider will inform you of  your "target" PT or INR range. If, at any time, your PT or INR is above the target range, there is a risk of bleeding. If your PT or INR is below the target range, there is a risk of clotting. Whether you are started on warfarin while you are in the hospital or in your health care provider's office, you will need to  have your PT or INR checked within one week of starting the medicine. Initially, some people are asked to have their PT or INR checked as much as twice a week. Once you are on a stable maintenance dose, the PT or INR is checked less often, usually once every 2 to 4 weeks. The warfarin dose may be adjusted if the PT or INR is not within the target range. It is important to keep all laboratory and health care provider follow-up appointments. Not keeping appointments could result in a chronic or permanent injury, pain, or disability because warfarin is a medicine that requires close monitoring. WHAT ARE THE SIDE EFFECTS OF WARFARIN?  Too much warfarin can cause bleeding (hemorrhage) from any part of the body. This may include bleeding from the gums, blood in the urine, bloody or dark stools, a nosebleed that is not easily stopped, coughing up blood, or vomiting blood.  Too little warfarin can increase the risk of blood clots.  Too little or too much warfarin can also increase the risk of a stroke.  Warfarin use may cause a skin rash or irritation, an unusual fever, continual nausea or stomach upset, or severe pain in your joints or back. SPECIAL PRECAUTIONS WHILE TAKING WARFARIN Warfarin should be taken exactly as directed. It is very important to take warfarin as directed since bleeding or blood clots could result in chronic or permanent injury, pain, or disability.  Take your medicine at the same time every day. If you forget to take your dose, you can take it if it is within 6 hours of when it was due.  Do not change the dose of warfarin on your own to make up for missed or extra doses.  If you miss more than 2 doses in a row, you should contact your health care provider for advice. Avoid situations that cause bleeding. You may have a tendency to bleed more easily than usual while taking warfarin. The following actions can limit bleeding:  Using a softer toothbrush.  Flossing with waxed floss  rather than unwaxed floss.  Shaving with an Neurosurgeonelectric razor rather than a blade.  Limiting the use of sharp objects.  Avoiding potentially harmful activities, such as contact sports. Warfarin and Pregnancy or Breastfeeding  Warfarin is not advised during the first trimester of pregnancy due to an increased risk of birth defects. In certain situations, a woman may take warfarin after her first trimester of pregnancy. A woman who becomes pregnant or plans to become pregnant while taking warfarin should notify her health care provider immediately.  Although warfarin does not pass into breast milk, a woman who wishes to breastfeed while taking warfarin should also consult with her health care provider. Alcohol, Smoking, and Illicit Drug Use  Alcohol affects how warfarin works in the body. It is best to avoid alcoholic drinks or consume very small amounts while taking warfarin. In general, alcohol intake should be limited to 1 oz (30 mL) of liquor, 6 oz (180 mL) of wine, or 12 oz (360 mL) of beer each day. Notify your health care provider if you change your alcohol intake.  Smoking affects how warfarin works. It is best to avoid smoking while taking warfarin. Notify your health care provider if you change your smoking habits.  It is best to avoid all illicit drugs while taking warfarin since there are few studies that show how warfarin interacts with these drugs. Other Medicines and Dietary Supplements Many prescription and over-the-counter medicines can interfere with warfarin. Be sure all of your health care providers know you are taking warfarin. Notify your health care provider who prescribed warfarin for you or your pharmacist before starting or stopping any new medicines, including over-the-counter vitamins, dietary supplements, and pain medicines. Your warfarin dose may need to be adjusted. Some common over-the-counter medicines that may increase the risk of bleeding while taking warfarin  include:   Acetaminophen.  Aspirin.  Nonsteroidal anti-inflammatory medicines (NSAIDs), such as ibuprofen or naproxen.  Vitamin E. Dietary Considerations  Foods that have moderate or high amounts of vitamin K can interfere with warfarin. Avoid major changes in your diet or notify your health care provider before changing your diet. Eat a consistent amount of foods that have moderate or high amounts of vitamin K. Eating less foods containing vitamin K can increase the risk of bleeding. Eating more foods containing vitamin K can increase the risk of blood clots. Additional questions about dietary considerations can be discussed with a dietitian. Foods that are very high in vitamin K:  Greens, such as Swiss chard and beet, collard, mustard, or turnip greens (fresh or frozen, cooked).  Kale (fresh or frozen, cooked).  Parsley (raw).  Spinach (cooked). Foods that are high in vitamin K:  Asparagus (frozen, cooked).  Beans, green (frozen, cooked).  Broccoli.  Bok choy (cooked).  Brussels sprouts (fresh or frozen, cooked).  Cabbage (cooked).   Coleslaw. Foods that are moderately high in vitamin K:  Blueberries.  Black-eyed peas.  Endive (raw).  Green leaf lettuce (raw).  Green scallions (raw).  Kale (raw).  Okra (frozen, cooked).  Plantains (fried).  Romaine lettuce (raw).  Sauerkraut (canned).  Spinach (raw). CALL YOUR CLINIC OR HEALTH CARE PROVIDER IF YOU:  Plan to have any surgery or procedure.  Feel sick, especially if you have diarrhea or vomiting.  Experience or anticipate any major changes in your diet.  Start or stop a prescription or over-the-counter medicine.  Become, plan to become, or think you may be pregnant.  Are having heavier than usual menstrual periods.  Have had a fall, accident, or any symptoms of bleeding or unusual bruising.  Develop an unusual fever. CALL 911 IN THE [place] OR GO TO THE EMERGENCY DEPARTMENT IF YOU:   Think you  may be having an allergic reaction to warfarin. The signs of an allergic reaction could include itching, rash, hives, swelling, chest tightness, or trouble breathing.  See signs of blood in your urine. The signs could include reddish, pinkish, or tea-colored urine.  See signs of blood in your stools. The signs could include bright red or black stools.  Vomit or cough up blood. In these instances, the blood could have either a bright red or a "coffee-grounds" appearance.  Have bleeding that will not stop after applying pressure for 30 minutes such as cuts, nosebleeds, or other injuries.  Have severe pain in your joints or back.  Have a new and severe headache.  Have sudden weakness or numbness of your face, arm, or leg, especially on one side of your body.  Have sudden confusion or trouble understanding.  Have sudden trouble seeing in one or  both eyes.  Have sudden trouble walking, dizziness, loss of balance, or coordination.  Have trouble speaking or understanding (aphasia). Document Released: 10/06/2005 Document Revised: 02/20/2014 Document Reviewed: 04/01/2013 [facility] Patient Information 2015 [place], [place]. This information is not intended to replace advice given to you by your health care provider. Make sure you discuss any questions you have with your health care provider.

## 2014-11-27 NOTE — Progress Notes (Addendum)
Patient ID: [name], male   DOB: [birth year], 64 y.o.   MRN: [id number]  [facility], including enoxaparin and warfarin dosing as well as laboratory monitoring, pending upcoming hemodialysis appointment on 11/28/14. Warfarin may be initiated at 2.5 mg daily if ok with nephrology.

## 2014-11-27 NOTE — Progress Notes (Signed)
Subjective:   Patient ID: [name] male   DOB: 1951-02-24 64 y.o.   MRN: [id number]  HPI: Mr.[name] Lacretia NicksW [name] is a 64 y.o. male with ESRD on HD and recent IVC thrombosis and other PMH as listed below presenting to opc today for routine follow up visit.  Care givers are present again today, but sister is not, although I did speak to the sister on the phone last week and appointment was set up so that she could be here for anticoagulation discussion if not able to be seen before by nephrology.   On our last phone conversation--the plan is for Ms. [name] to meet with Dr. Arrie [name] from nephrology tomorrow during [name]' HD session and decide on anticoagulation--whether they wish to proceed with coumadin. I also called Ms. [name] to confirm this is the case.  She plans to have the discussion tomorrow but is not sure of the time to meet Dr. Arrie [name].   Care givers report no current issues. Last lovenox injection yesterday and prescription for 7 more injections is at the pharmacy and our pharmacist will verify that as well.   The claritin started last office visit seems to be helping per caretaker.   Past Medical History  Diagnosis Date  . Heart murmur, systolic 08/10/2009  . Syncope 05/07/2009  . 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 Esophageal varices 11/07/2008  . Gastric ulcer 10/09    antral, with h pylori positive  . Congestive heart failure 03/06/2008  . Cellulitis and abscess of leg, except foot 03/06/2008  . Secondary hyperparathyroidism 02/02/2008  . Mute 02/02/2008  . Hyperlipidemia 02/02/2008  . Anemia 02/02/2008  . ESRD (end stage renal disease)     TTS hemodialysis  . GERD (gastroesophageal reflux disease)   . Hypertension 02/02/2008    in history  . Mental retardation 02/02/2008  . Mute   . Seizures     "non in a while at home". none in past year .  [name]. Complication of anesthesia     in the past BP has dropped   . Sepsis due to Pseudomonas species 05/05/2014     Current Outpatient Prescriptions  Medication Sig Dispense Refill  . atorvastatin (LIPITOR) 20 MG tablet Take 1 tablet (20 mg total) by mouth at bedtime. 90 tablet 2  . cinacalcet (SENSIPAR) 30 MG tablet Take 1 tablet (30 mg total) by mouth daily with breakfast. 60 tablet 6  . clonazePAM (KLONOPIN) 0.5 MG tablet Take 0.5 tablets (0.25 mg total) by mouth 2 (two) times daily. 30 tablet 5  . enoxaparin (LOVENOX) 60 MG/0.6ML injection Inject 0.6 mLs (60 mg total) into the skin daily. 7 Syringe 0  . folic acid-vitamin b complex-vitamin c-selenium-zinc (DIALYVITE) 3 MG TABS tablet Take 1 tablet by mouth daily.    [name]. loratadine (CLARITIN) 5 MG chewable tablet Chew 1 tablet (5 mg total) by mouth every other day. 30 tablet 0  . midodrine (PROAMATINE) 10 MG tablet Take 1.5 tablets (15 mg total) by mouth 3 (three) times daily. 135 tablet 11  . pantoprazole (PROTONIX) 40 MG tablet Take 1 tablet (40 mg total) by mouth daily. 30 tablet 11  . phenytoin (DILANTIN) 100 MG ER capsule Take 200 mg by mouth at bedtime.    . sevelamer carbonate (RENVELA) 800 MG tablet Take 800 mg by mouth 3 (three) times daily with meals.    . vancomycin 500 mg in sodium chloride 0.9 % 100 mL Inject 500 mg into the vein Every Tuesday,Thursday,and  Saturday with dialysis.    [name] warfarin (COUMADIN) 2.5 MG tablet Take 1 tablet by mouth daily. Adjust dose as instructed by clinic. 30 tablet 1   No current facility-administered medications for this visit.   No family history on file. History   Social History  . Marital Status: Single    Spouse Name: N/A    Number of Children: 0  . Years of Education: N/A   Occupational History  .     Social History Main Topics  . Smoking status: Never Smoker   . Smokeless tobacco: Never Used  . Alcohol Use: No  . Drug Use: No  . Sexual Activity: No   Other Topics Concern  . None   Social History Narrative   Patient is living with care providers.    Patient is right handed.    Patient  does not have any children.    Patient is on disability           Review of Systems:  Unable to obtain as patient is non-verbal. But caretakers deny any recent fevers or signs of abdominal pain.   Objective:  Physical Exam: Filed Vitals:   11/27/14 0931  BP: 100/63  Pulse: 106  Temp: 98.2 F (36.8 C)  TempSrc: Oral  Weight: 125 lb 6.4 oz (56.881 kg)  SpO2: 100%   Vitals reviewed. General: sitting in chair, NAD, appears sleepy HEENT: Drooling at times Cardiac: Tachycardia Pulm: shallow respirations Abd: soft, BS present Ext: can move extremities Neuro: awake but sleepy, did lean forward when I asked to listen to his lungs but otherwise not following commands, non-verbal  Assessment & Plan:  Discussed with Dr. Heide Spark

## 2014-11-28 NOTE — Patient Instructions (Signed)
Caregiver educated about medication dosing as defined in this encounter and verbalized understanding by repeating back instructions provided.

## 2014-11-28 NOTE — Progress Notes (Signed)
I agree with documentation and plan as outlined in Dr. Elmyra RicksKim's note. Pt to f/u with [name] and will likely get his INR checked there if he decides to go on coumadin. Patient to continue with lovenox for now

## 2014-11-28 NOTE — Progress Notes (Signed)
INTERNAL MEDICINE TEACHING ATTENDING ADDENDUM - Qaadir Kent, MD: I reviewed and discussed at the time of visit with the resident Dr. Qureshi, the patient's medical history, physical examination, diagnosis and results of pertinent tests and treatment and I agree with the patient's care as documented.  

## 2014-11-28 NOTE — Progress Notes (Signed)
CLINICAL PHARMACY ANTICOAGULATION MANAGEMENT [name] is a 64 y.o. male who reports to the clinic for monitoring of warfarin treatment.    Indication: VTE, atrial thrombus Duration: TBD (warfarin on hold for now)  Anticoagulation Clinic Visit History: Anticoagulation Episode Summary    Current INR goal    Next INR check 12/19/2014   INR from last check    Most recent INR 1.2 (11/27/2014)   Weekly max dose    Target end date    INR check location    Preferred lab    Send INR reminders to       Comments        ASSESSMENT Recent Results: Lab Results  Component Value Date   INR 1.2 11/27/2014   INR 1.3 11/20/2014   INR 1.36 10/27/2014   Anticoagulation Dosing: Warfarin initiation on hold for now, patient is continuing enoxaparin 60 mg daily.   PLAN Plan to hand off anticoagulation management to [facility], including enoxaparin and warfarin dosing as well as laboratory monitoring, pending upcoming hemodialysis appointment on 11/28/14. Warfarin may be initiated at 2.5 mg daily if ok with nephrology.  Patient Instructions: Patient Instructions  Caregiver educated about medication dosing as defined in this encounter and verbalized understanding by repeating back instructions provided.   Follow-up Return for no follow up appointment needed at this time. Will base follow up monitoring on outcomes of appointment with [facility] 11/28/14.  Marzetta BoardJennifer Johnmatthew Solorio, PharmD BCPS, BCACP

## 2014-11-28 NOTE — Assessment & Plan Note (Addendum)
Continued on lovenox injections for now. Sister is to meet and discuss with nephrologist during HD 2/9 and decide if they wish to proceed with coumadin.  As such, INR checked again today by pharmacy and was 1.2. Coumadin was sent to pharmacy last visit by pharmacist and picked up by caretakers but have no started doses yet.   If family decides to proceed with coumadin, dosing will need to take place by pharmacy (last visit 2.5mg  daily was recommended to start--pharmacy will need to re-dose based on new INR) and Hb and INR checks will need to take place likely with nephrologist and during HD as patient cannot come to multiple office visits.   Caretakers are instructed NOT to start coumadin yet until family reaches a consensus and we are asked to be notified once final decision is made. In the meantime, [name] will continue with Lovenox injections (should have 7 more at pharmacy ready for pick up)--Mr. Toni ArthursFuller voices understanding of instructions today in office  There appears to be disconnect between caretaker who gives medications and sister who is making decisions, therefore, when family is contacted and decision is made, caretaker Mr. Toni ArthursFuller needs to be notified as well.   This note will be forwarded to pcp who can follow along with the patient and if anticoagulation with coumadin is initiated.   Addendum 12/06/14:  I received word from Dr. Arrie Aranoladonato that after discussion, no anticoagulation was decided upon as risks outweigh benefits. I will d/c lovenox and coumadin from medication list and forward this to PCP who can follow up with family and renal as well.  I will also forward this addendum to pharmacy who can please make sure anticoagulation is discontinued from pharmacy and their records are updated. I will also update Dr. Meredith PelJoines and route note to him.

## 2014-12-05 ENCOUNTER — Other Ambulatory Visit: Payer: Self-pay | Admitting: Internal Medicine

## 2014-12-05 ENCOUNTER — Telehealth: Payer: Self-pay | Admitting: *Deleted

## 2014-12-05 DIAGNOSIS — K257 Chronic gastric ulcer without hemorrhage or perforation: Secondary | ICD-10-CM

## 2014-12-05 MED ORDER — ESOMEPRAZOLE MAGNESIUM 40 MG PO CPDR: 40.0000 mg | DELAYED_RELEASE_CAPSULE | Freq: Every day | ORAL | Status: AC

## 2014-12-05 NOTE — Telephone Encounter (Signed)
Received PA request from pt's pharmacy for his pantoprazole 40mg  once daily tabs.  Medication is no longer covered by pt's insurance.  Preferred meds include omeprazole (Tier2), dexiant (Tier3), and nexium.  Pt's family was contacted and informed there may be a change in his med.  Will send to pcp for review.  Please advise.Kingsley SpittleGoldston, Darlene Cassady2/16/20161:12 PM

## 2014-12-05 NOTE — Telephone Encounter (Signed)
I e-prescribed Nexium for him.

## 2014-12-06 ENCOUNTER — Telehealth: Payer: Self-pay | Admitting: Pharmacist

## 2014-12-06 NOTE — Addendum Note (Signed)
Addended by: Baltazar ApoQURESHI, Titania Gault J on: 12/06/2014 04:19 AM   Modules accepted: Orders, Medications

## 2014-12-06 NOTE — Telephone Encounter (Signed)
Provided education regarding risks vs. benefits as well as collaborative decision (including Dr. Dolores Pattyaldonato) to forego anticoagulation at this time. Caregiver verbalized understanding.

## 2014-12-12 ENCOUNTER — Encounter: Payer: Medicare Other | Admitting: Internal Medicine

## 2014-12-25 ENCOUNTER — Other Ambulatory Visit: Payer: Self-pay | Admitting: Neurology

## 2014-12-25 ENCOUNTER — Other Ambulatory Visit: Payer: Self-pay | Admitting: Internal Medicine

## 2014-12-28 NOTE — Telephone Encounter (Signed)
Rx faxed in.

## 2015-01-04 ENCOUNTER — Telehealth: Payer: Self-pay | Admitting: Internal Medicine

## 2015-01-04 IMAGING — CR DG CHEST 1V PORT
1 series · 1 of 1 positions shown · non-contrast
Comparison: 04/24/2014.

CLINICAL DATA: Dialysis catheter removed.

EXAM:
PORTABLE CHEST - 1 VIEW

[AP]
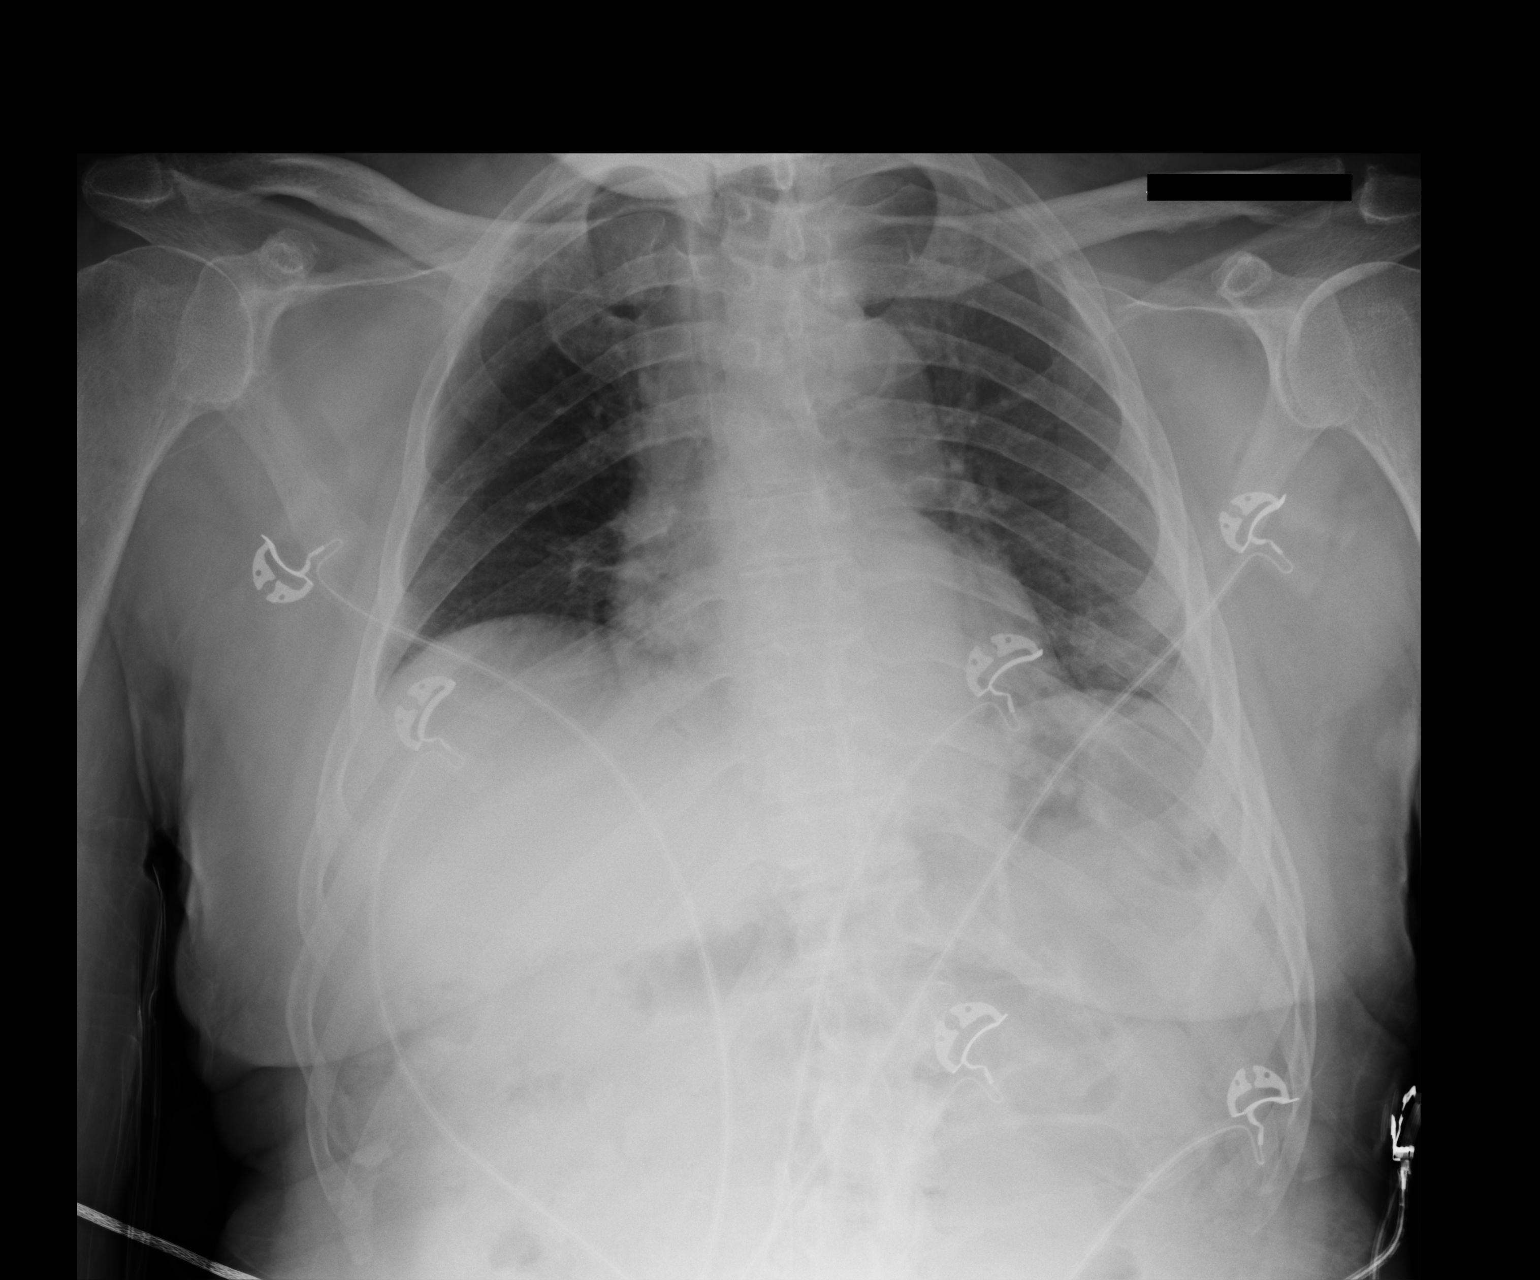

[1 of 1 positions shown; findings below may reference images not displayed]

FINDINGS: Mediastinum and hilar structures are normal. Subsegmental
atelectasis. No pleural effusion or pneumothorax. No acute osseous
abnormality.
IMPRESSION: Bibasilar subsegmental atelectasis. No acute cardiopulmonary
disease.

## 2015-01-04 NOTE — Telephone Encounter (Signed)
Call to patient to confirm appointment for 01/08/15 at 9:15 lmtcb

## 2015-01-08 ENCOUNTER — Encounter: Payer: Medicare Other | Admitting: Internal Medicine

## 2015-01-16 IMAGING — XA IR FLUORO GUIDE CV LINE*R*
1 series · 6 of 6 positions shown · IV contrast (IODINE)
Comparison: none

CLINICAL DATA: 63-year-old with end-stage renal disease. Right
groin Perm Cath is malfunctioning.

[Series 300: ir fluoro guide cv line*r* · 6 of 6 slices shown]
[im 1/6]
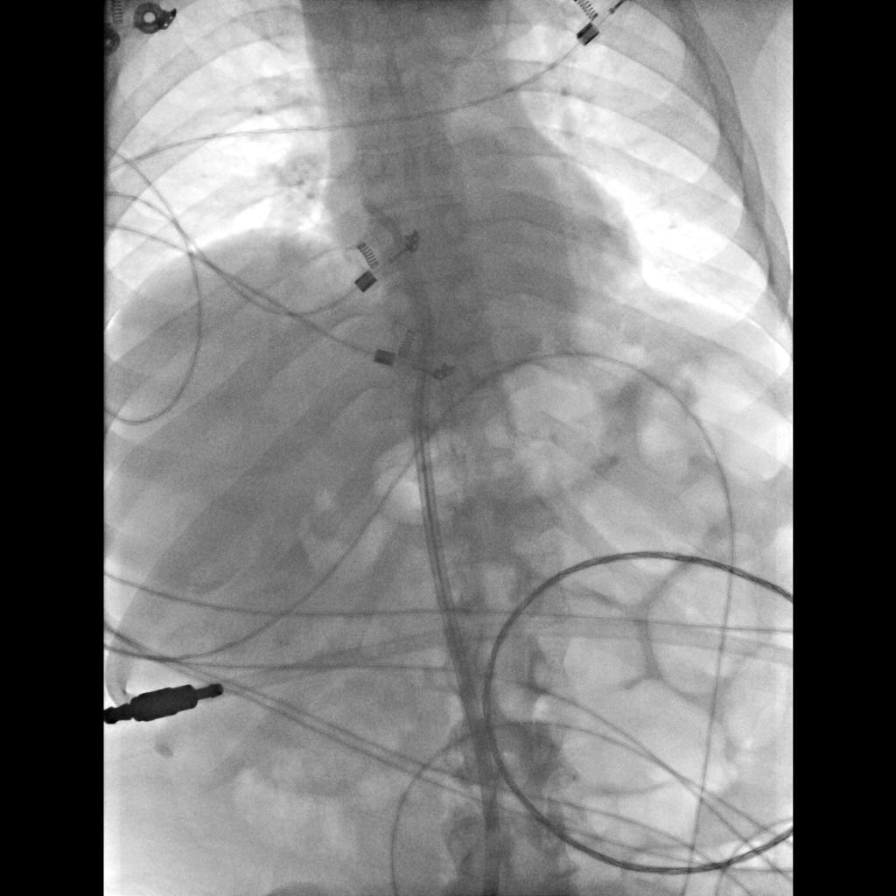
[im 2/6]
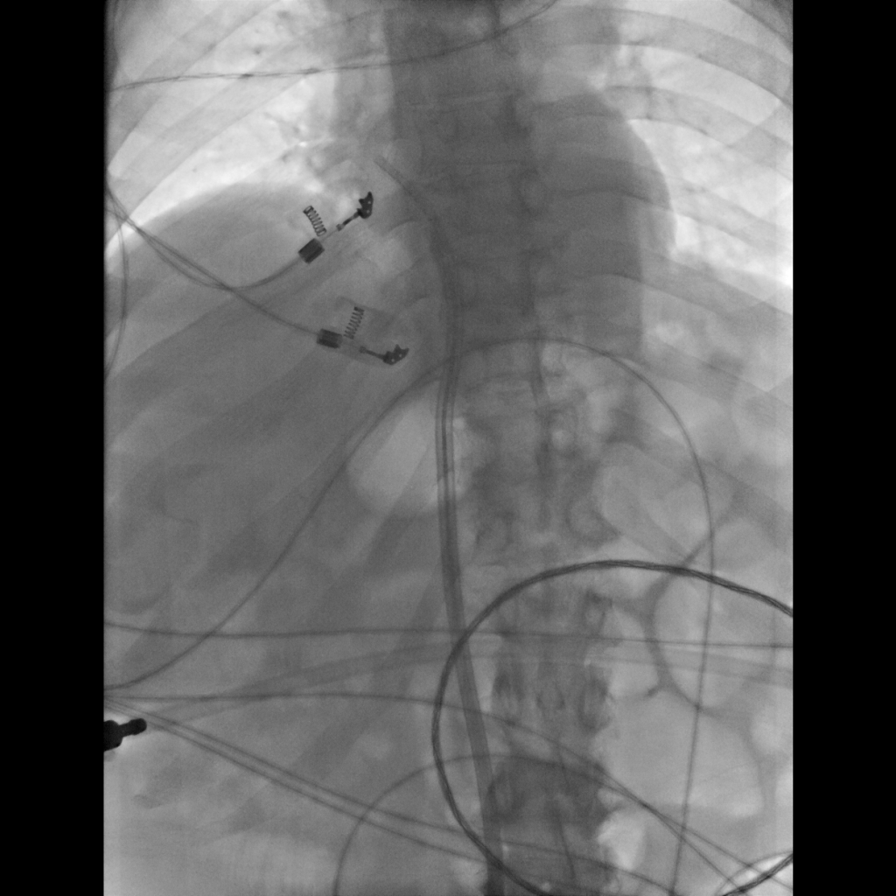
[im 3/6]
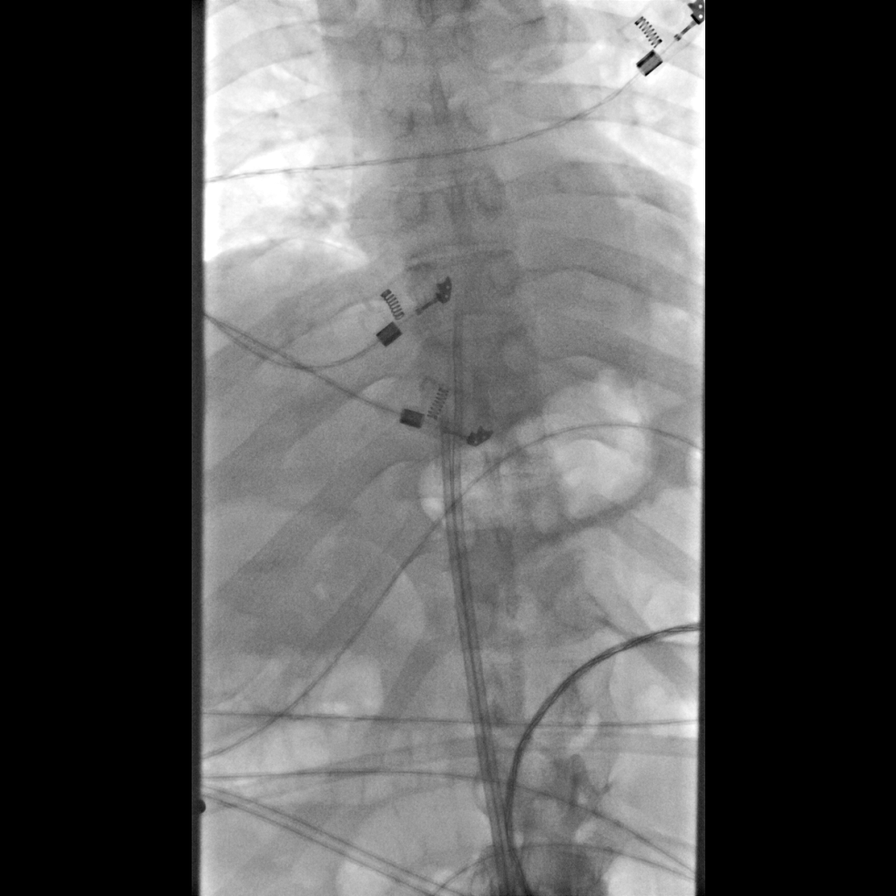
[im 4/6]
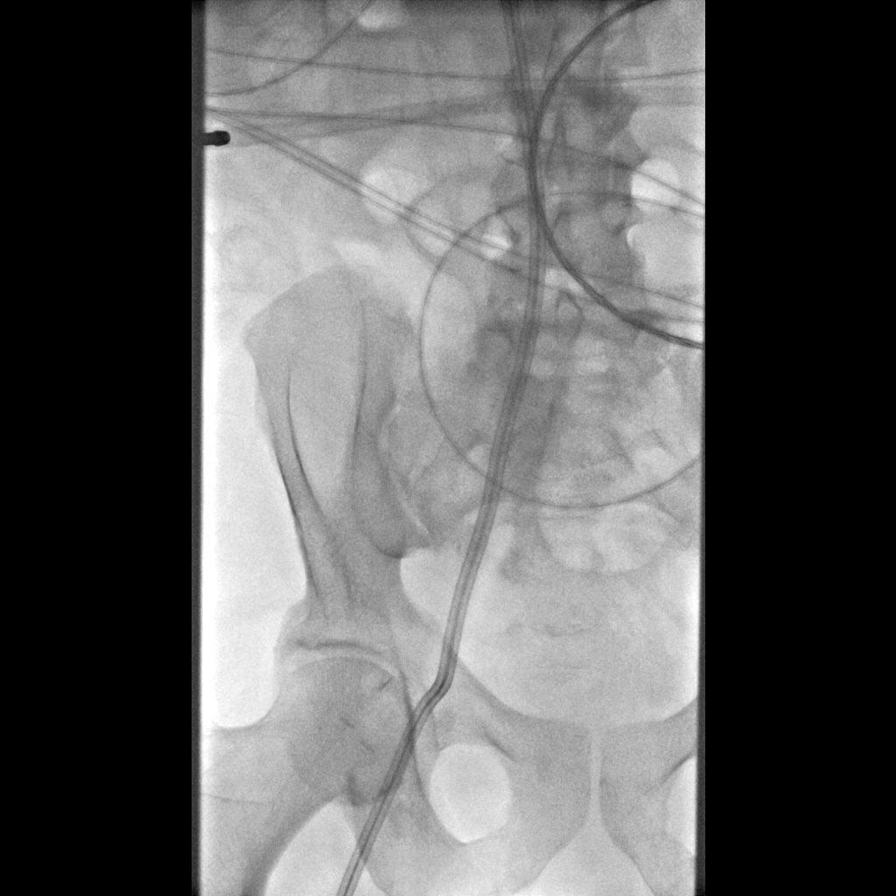
[im 5/6]
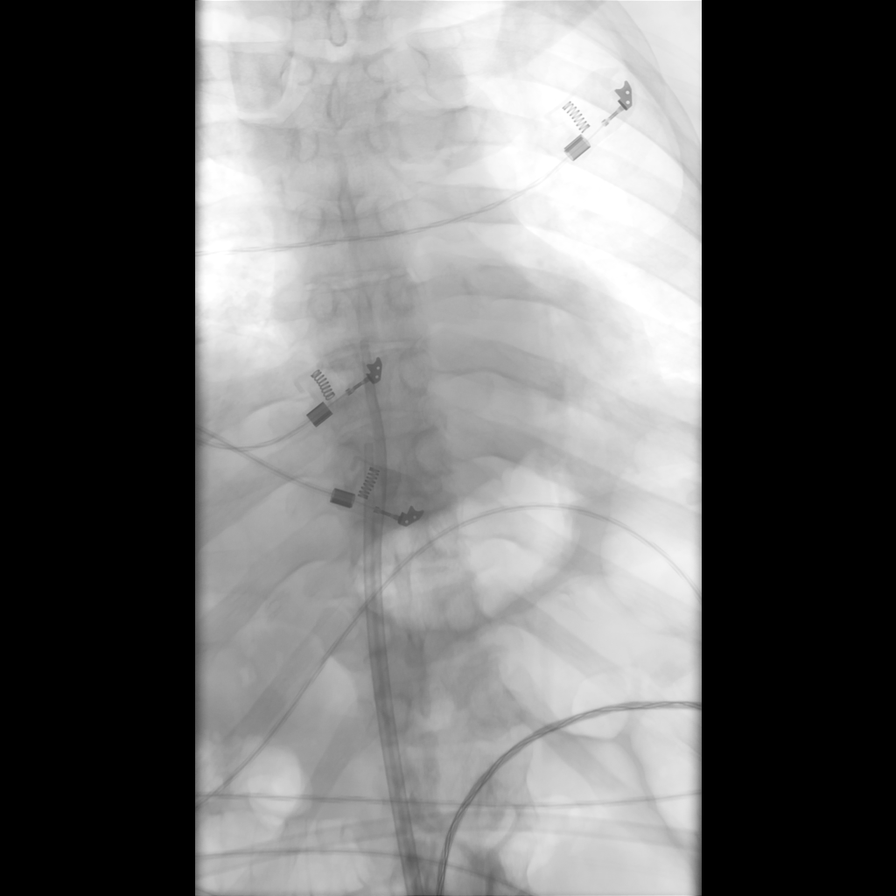
[im 6/6]
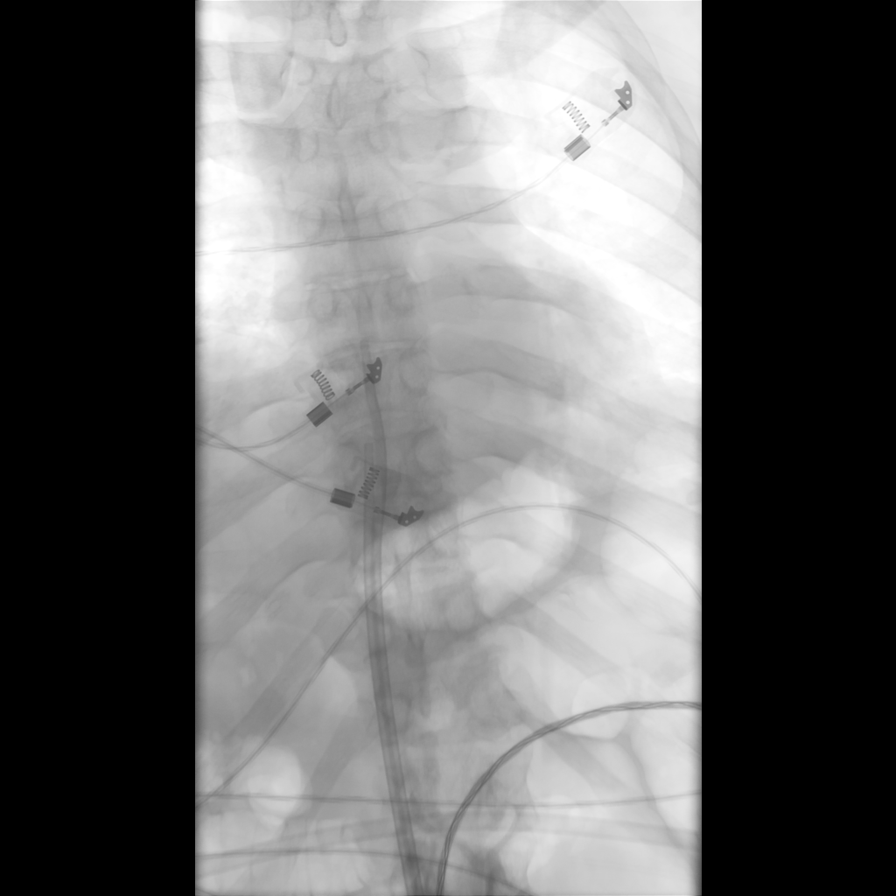

[6 of 6 positions shown; findings below may reference images not displayed]

EXAM:
EXCHANGE OF TUNNELED DIALYSIS CATHETER WITH FLUOROSCOPY

FLUOROSCOPY TIME:  4 min and 6 seconds

MEDICATIONS:
Patient was sedated and monitored by Anesthesia. Ancef 2 g. As
antibiotic prophylaxis, Ancef was ordered pre-procedure and
administered intravenously within one hour of incision.

ANESTHESIA/SEDATION:
Patient was sedated and monitored by Anesthesia.

PROCEDURE:
Informed consent was obtained for a catheter exchange. The right
groin and existing catheter were prepped and draped in sterile
fashion. Maximal barrier sterile technique was utilized including
caps, mask, sterile gowns, sterile gloves, sterile drape, hand
hygiene and skin antiseptic. The retention sutures were removed.
Catheter was removed over a stiff Glidewire. The distal lumen is
found to be occluded with clot. A 42 cm tip to cuff HemoSplit
catheter was advanced over the wire. The catheter was hubbed in the
right groin but the tip was only in the upper IVC. As result, this
catheter was removed over the wire. A 50 cm Medcomp Split Cath was
advanced over the wire. The tip was placed within the right atrium.
The lumens aspirated and flushed well. Appropriate amount of heparin
was placed in both lumens. The catheter was sutured to the skin.
Sterile dressing was placed. Fluoroscopic images were taken and
saved for this procedure.
FINDINGS: New catheter tip in the lower right atrium. Both lumens aspirated
and flushed well.

COMPLICATIONS:
None
IMPRESSION: Successful exchange of the right groin tunneled dialysis catheter
with fluoroscopy.

## 2015-01-19 DEATH — deceased

## 2015-03-07 ENCOUNTER — Ambulatory Visit: Payer: Medicare Other | Admitting: Nurse Practitioner

## 2015-03-08 ENCOUNTER — Encounter: Payer: Self-pay | Admitting: Nurse Practitioner

## 2015-03-08 ENCOUNTER — Telehealth: Payer: Self-pay | Admitting: *Deleted

## 2015-03-12 NOTE — Telephone Encounter (Signed)
Awaiting word from pt.

## 2015-07-03 IMAGING — CR DG CHEST 1V PORT
1 series · 1 of 1 positions shown · non-contrast
Comparison: 05/09/2014

CLINICAL DATA: Altered mental status. Congestive heart failure.
Hypertension.

EXAM:
PORTABLE CHEST - 1 VIEW

[AP]
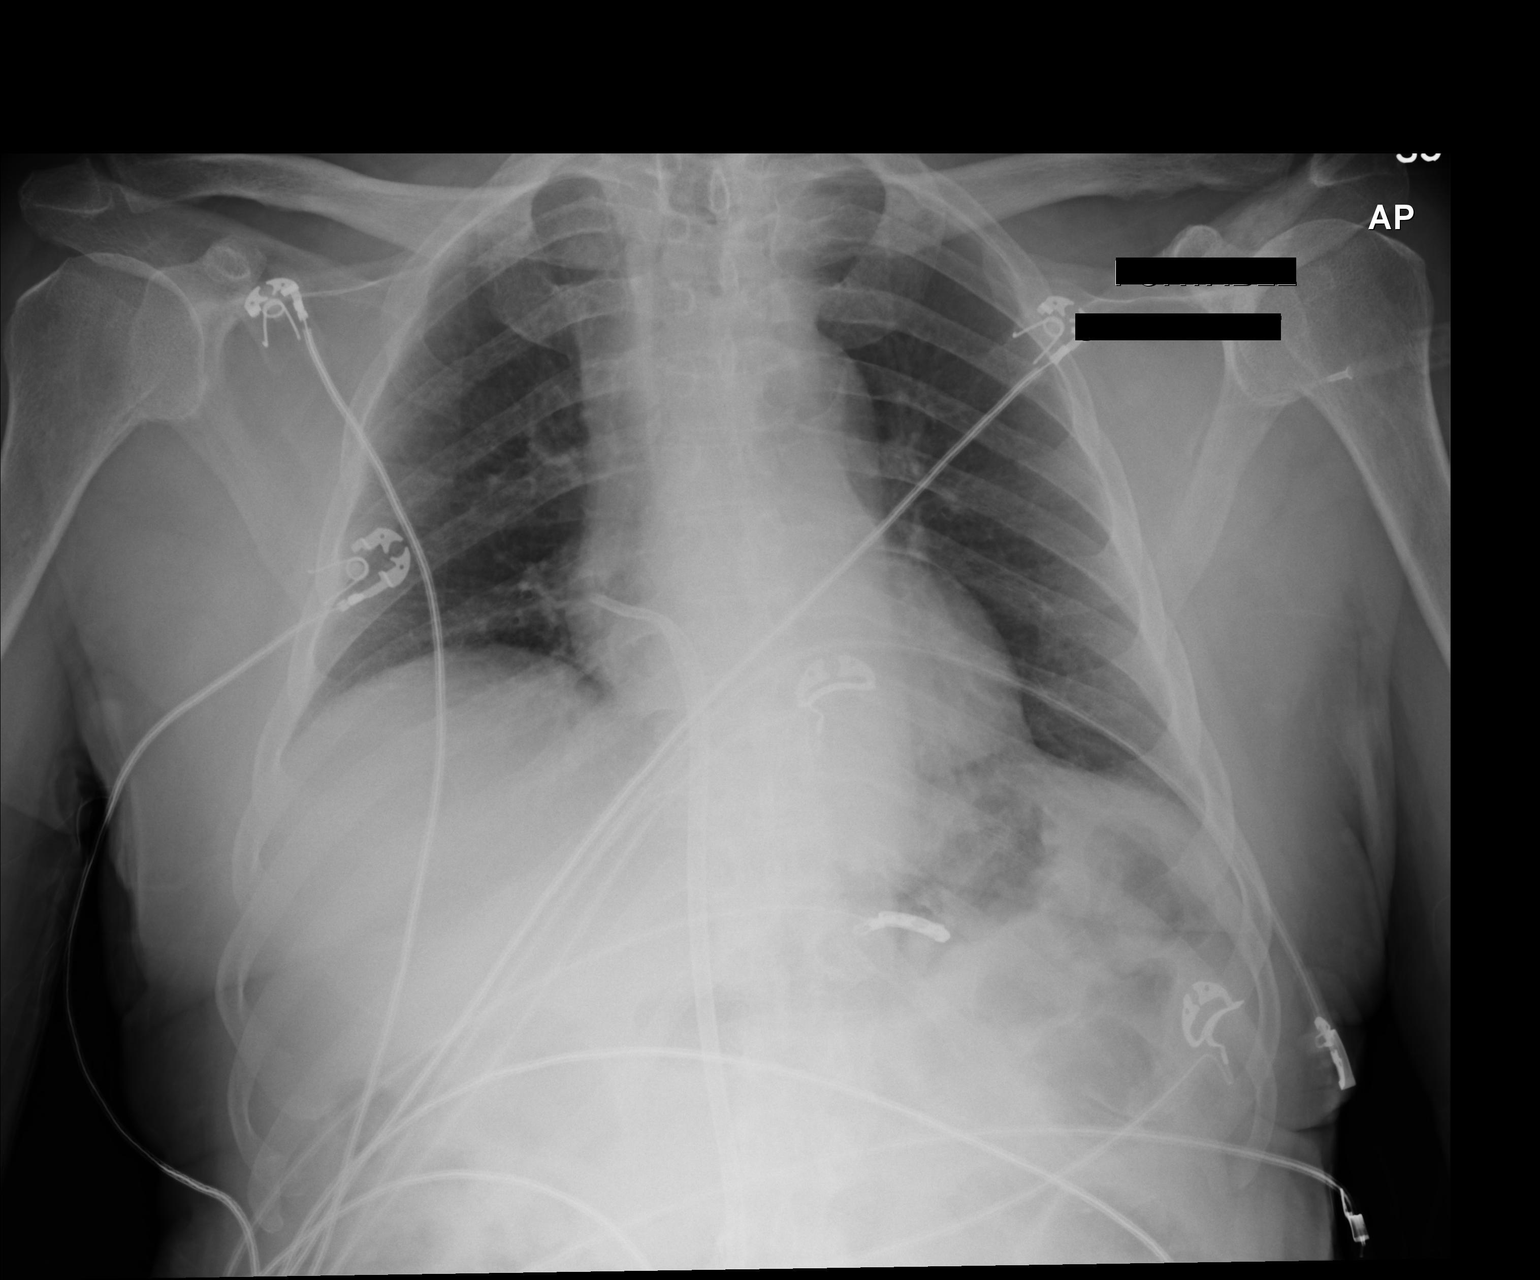

[1 of 1 positions shown; findings below may reference images not displayed]

FINDINGS: Right groin tunneled dialysis catheter noted, terminating in the
right atrium.

The patient is rotated to the Right on today's radiograph, reducing
diagnostic sensitivity and specificity. Low lung volumes. Mild
atherosclerotic calcification of the aortic arch.

The lungs appear clear.
IMPRESSION: 1. Low lung volumes.  However, the lungs appear clear.
2. Dialysis catheter tip: Right atrium.

## 2015-07-03 IMAGING — CT CT ABD-PELV W/ CM
2 of 5 series · 15 of 46 positions shown, 17 images · IV contrast (Omni 300)
Comparison: CT chest abdomen and pelvis 10/05/2013

CLINICAL DATA: Patient arrives via EMS from dialysis center.
Patient was extremely combative had a fever and possible UTI.
Generalized abdominal pain.

EXAM:
CT ABDOMEN AND PELVIS WITH CONTRAST
TECHNIQUE: Multidetector CT imaging of the abdomen and pelvis was performed
using the standard protocol following bolus administration of
intravenous contrast.
CONTRAST:  100mL OMNIPAQUE IOHEXOL 300 MG/ML  SOLN

[Series 2: abd/ pelvis 5.0 i30f 1 · axial · 0.70mm/px · z∈[-438,-44]mm · 12 of 89 slices shown, 14 images]
[im 5/89  soft-tissue]
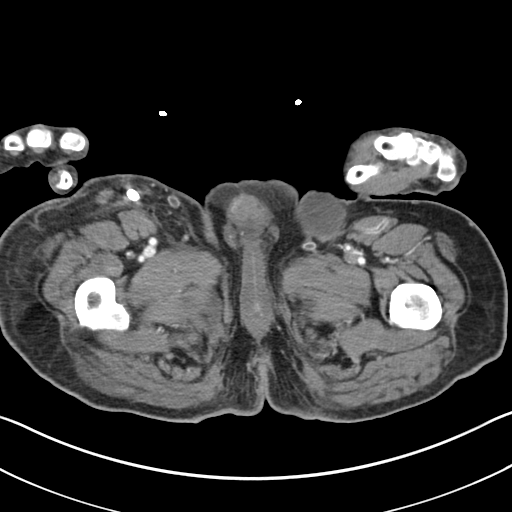
[im 5/89  bone]
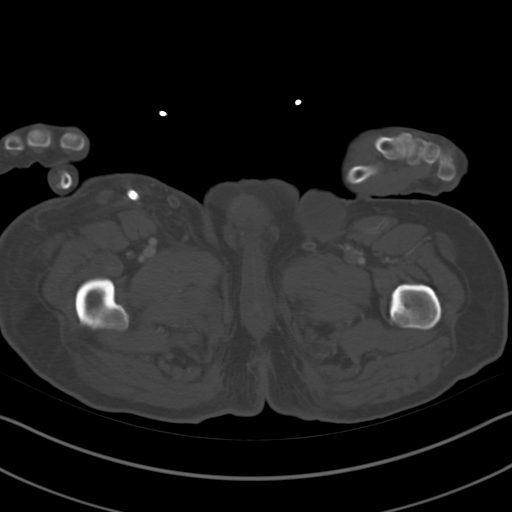
[im 14/89  soft-tissue]
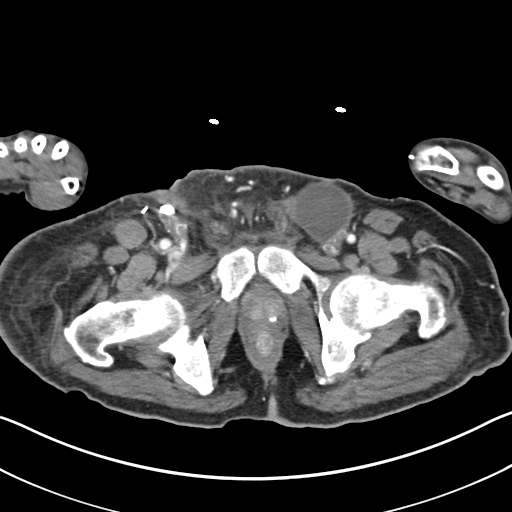
[im 19/89  soft-tissue]
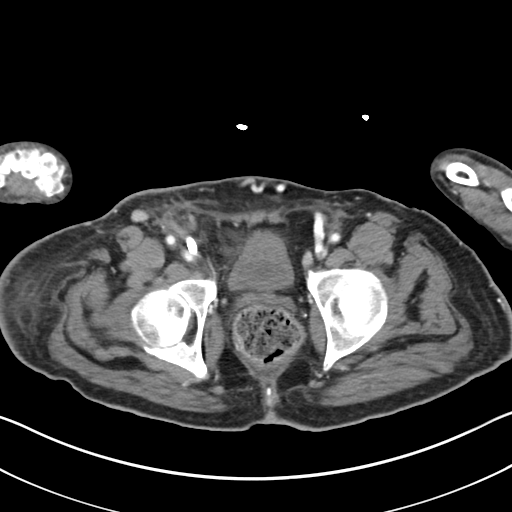
[im 28/89  soft-tissue]
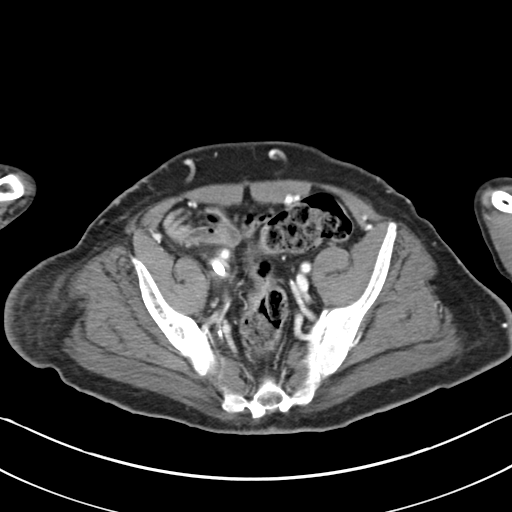
[im 33/89  soft-tissue]
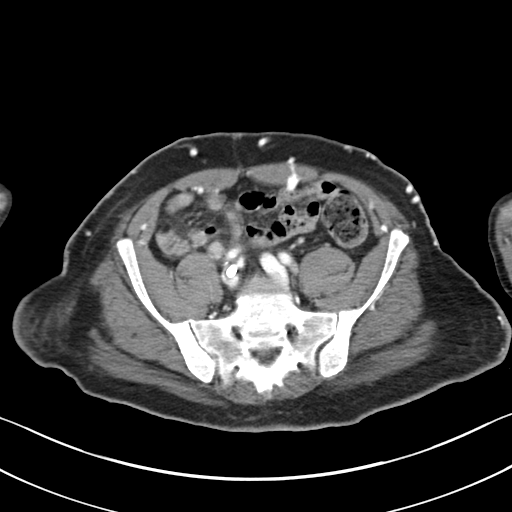
[im 42/89  soft-tissue]
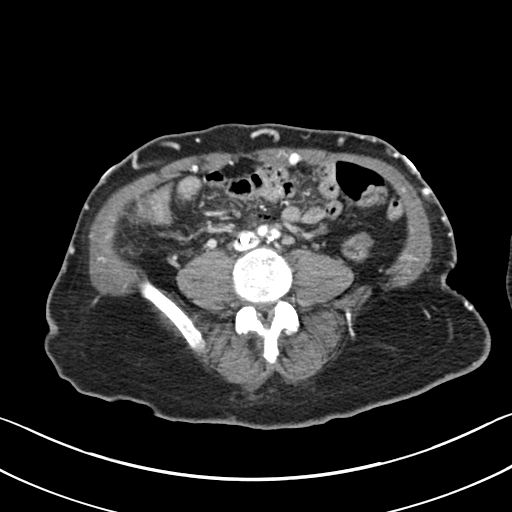
[im 47/89  soft-tissue]
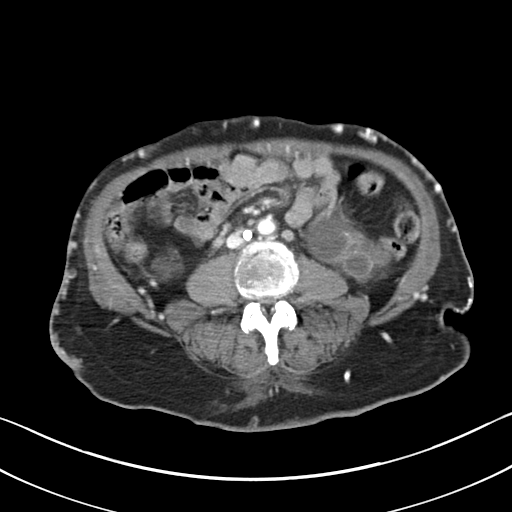
[im 56/89  soft-tissue]
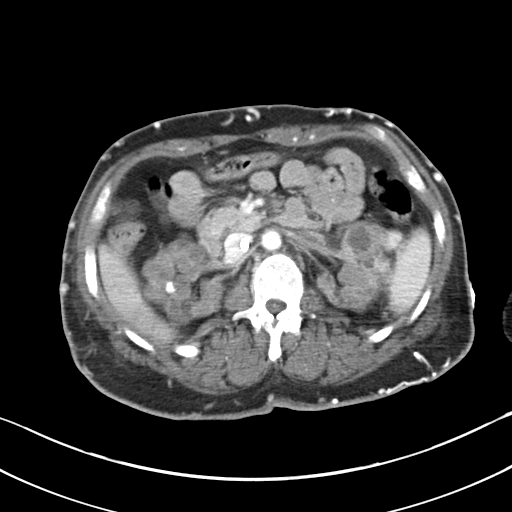
[im 61/89  soft-tissue]
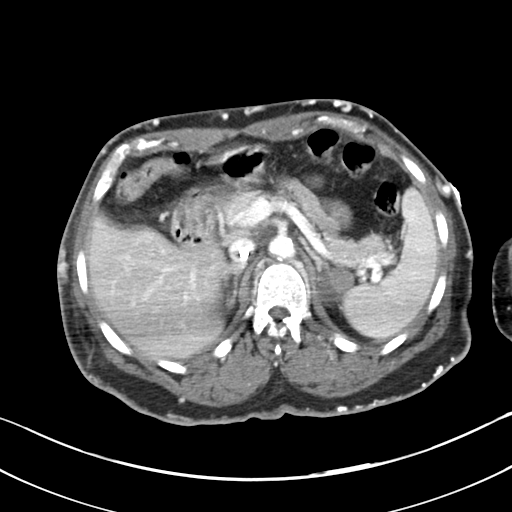
[im 61/89  bone]
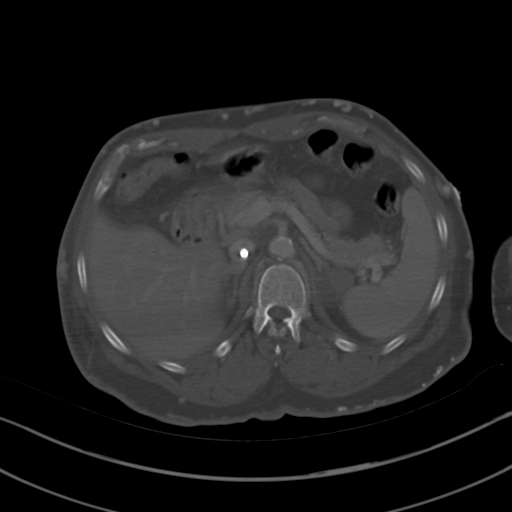
[im 70/89  soft-tissue]
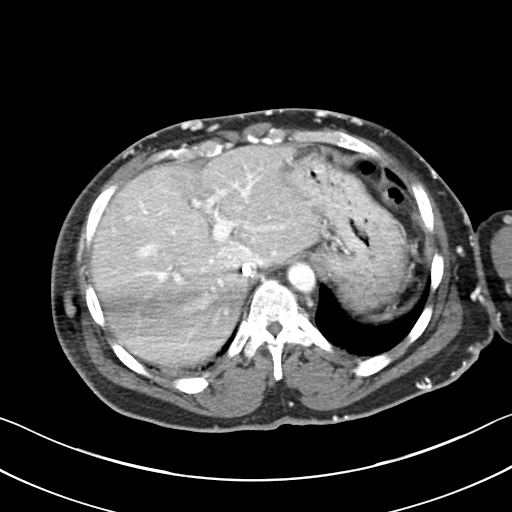
[im 75/89  soft-tissue]
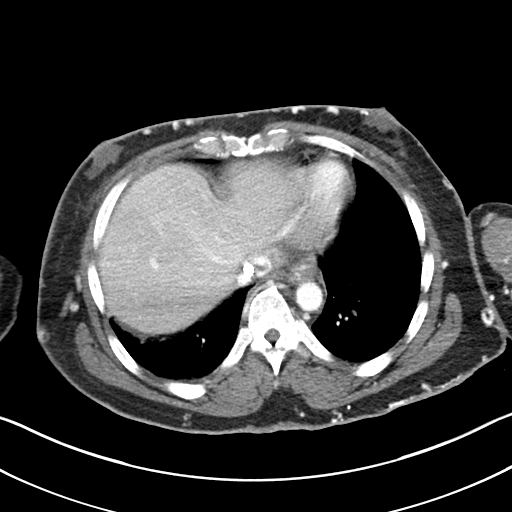
[im 84/89  soft-tissue]
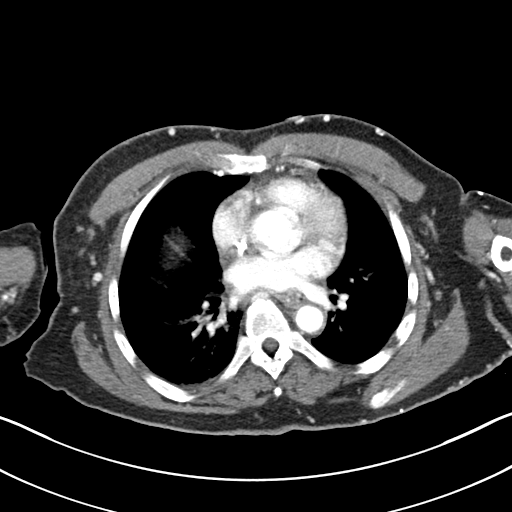

[Series 5: coronals · coronal · 0.70mm/px · 3 of 106 slices shown]
[im 36/106  soft-tissue]
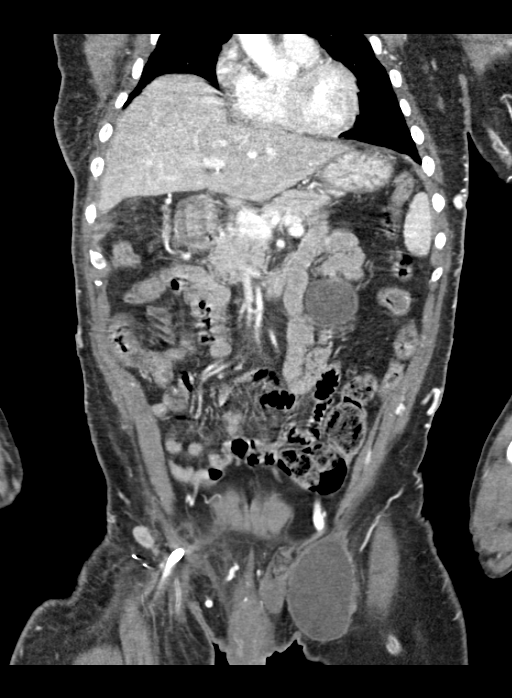
[im 47/106  soft-tissue]
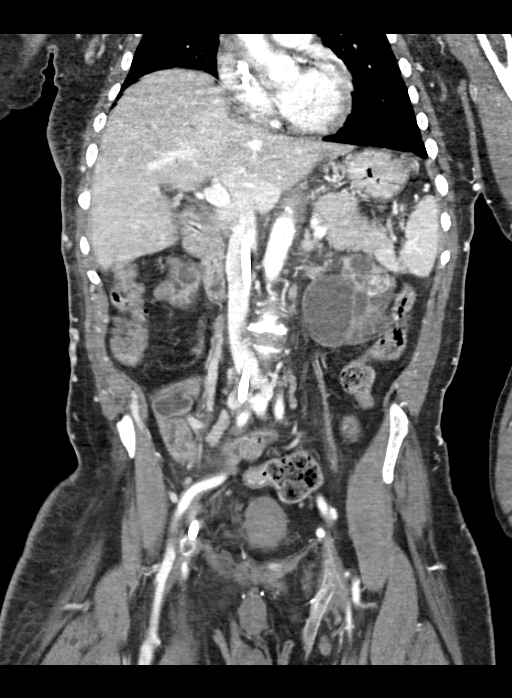
[im 59/106  soft-tissue]
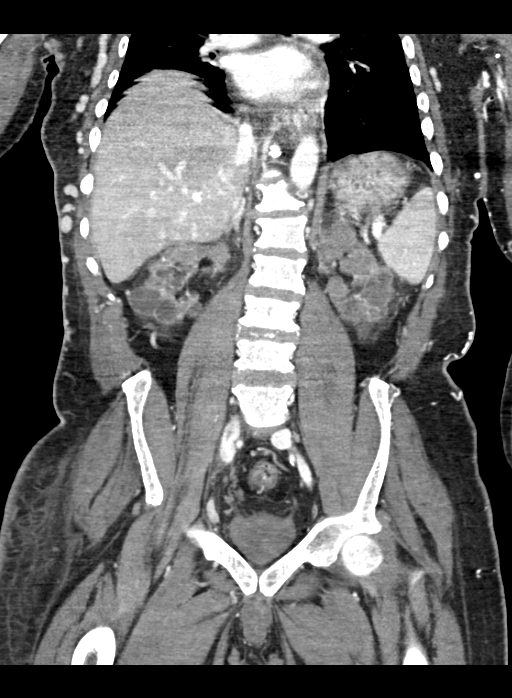

[15 of 46 positions shown; findings below may reference images not displayed]

FINDINGS: Evaluation of lung bases is limited due to motion artifact.
Suggestion of vascular prominence and interstitial changes probably
due to edema and fluid overload.

Examination is technically limited due to motion artifact. The
liver, spleen, pancreas, adrenal glands, and retroperitoneal lymph
nodes appear grossly unremarkable. Gallbladder is contracted appears
to be filled with sludge or possibly small stones. No bile duct
dilatation. Multi-cystic appearance of the kidneys, some with
calcification and some with increased density suggesting hemorrhage.
Appearance is grossly unchanged since prior study. No
hydronephrosis. Calcification of the abdominal aorta without
aneurysm. Right femoral approach intravenous catheter with tip in
the right atrium consistent with dialysis catheter. Stomach, small
bowel, and colon appear decompressed. No free air or free fluid in
the abdomen. Multiple venous collateral vessels are demonstrated
throughout the subcutaneous soft tissues of the abdominal wall
bilaterally. Previous study demonstrate sclerosis of the superior
vena cava, likely accounting for the collaterals.

Pelvis: Stool in the rectosigmoid colon. No inflammatory changes.
Bladder is decompressed. Prostate gland is not significantly
enlarged. No free or loculated pelvic fluid collections. There is a
fluid collection measuring 4.6 x 5 x 7.2 cm demonstrated in the
right groin adjacent to vascular grafts arising from the common
femoral artery. This is new since previous study and could represent
developing hematoma. Alternatively, infected graft with abscess
could also have this appearance. Infiltration in the subcutaneous
fat consistent with edema. Mild prominence of lymph nodes in the
groin regions bilaterally likely are reactive. With the mild lumbar
scoliosis convex towards the left. Degenerative changes in the
lumbar spine. No destructive bone lesions appreciated.
IMPRESSION: Multi-cystic appearance of both kidneys, unchanged since prior
study. Multiple venous collaterals demonstrated throughout the
subcutaneous soft tissues of the abdominal wall consistent with
sclerosis of the superior vena cava noted on prior chest CT.
Interval development of a fluid collection in the left groin region
adjacent to vascular grafts. This could represent hematoma,
pseudoaneurysm, or infection related to the graft. Sludge or small
stones in the gallbladder.
